# Patient Record
Sex: Male | Born: 1940 | Race: White | Hispanic: No | Marital: Married
Health system: Southern US, Community
[De-identification: ages and names within clinical notes are randomized; demographics above are authoritative.]

## PROBLEM LIST (undated history)

## (undated) DIAGNOSIS — N200 Calculus of kidney: Secondary | ICD-10-CM

## (undated) DIAGNOSIS — G4700 Insomnia, unspecified: Secondary | ICD-10-CM

## (undated) DIAGNOSIS — Z95811 Presence of heart assist device: Secondary | ICD-10-CM

## (undated) DIAGNOSIS — Z95810 Presence of automatic (implantable) cardiac defibrillator: Secondary | ICD-10-CM

## (undated) DIAGNOSIS — J189 Pneumonia, unspecified organism: Secondary | ICD-10-CM

## (undated) DIAGNOSIS — I459 Conduction disorder, unspecified: Secondary | ICD-10-CM

## (undated) DIAGNOSIS — K219 Gastro-esophageal reflux disease without esophagitis: Secondary | ICD-10-CM

## (undated) DIAGNOSIS — M1990 Unspecified osteoarthritis, unspecified site: Secondary | ICD-10-CM

## (undated) DIAGNOSIS — Z8719 Personal history of other diseases of the digestive system: Secondary | ICD-10-CM

## (undated) DIAGNOSIS — I428 Other cardiomyopathies: Secondary | ICD-10-CM

## (undated) HISTORY — PX: CYSTOSCOPY W/ STONE MANIPULATION: SHX1427

## (undated) HISTORY — PX: CARDIAC CATHETERIZATION: SHX172

## (undated) HISTORY — DX: Insomnia, unspecified: G47.00

## (undated) HISTORY — DX: Gastro-esophageal reflux disease without esophagitis: K21.9

## (undated) HISTORY — DX: Other cardiomyopathies: I42.8

## (undated) HISTORY — DX: Unspecified osteoarthritis, unspecified site: M19.90

## (undated) HISTORY — PX: APPENDECTOMY: SHX54

## (undated) HISTORY — PX: EXPLORATORY LAPAROTOMY: SUR591

## (undated) HISTORY — DX: Calculus of kidney: N20.0

## (undated) HISTORY — PX: LITHOTRIPSY: SUR834

---

## 1988-11-07 HISTORY — PX: KNEE ARTHROSCOPY: SHX127

## 1999-03-10 HISTORY — PX: POSTERIOR FUSION CERVICAL SPINE: SUR628

## 2001-12-16 ENCOUNTER — Ambulatory Visit (HOSPITAL_COMMUNITY): Admission: RE | Admit: 2001-12-16 | Discharge: 2001-12-16 | Payer: Self-pay | Admitting: Cardiology

## 2005-03-09 HISTORY — PX: VOLVULUS REDUCTION: SHX425

## 2005-10-02 ENCOUNTER — Ambulatory Visit: Payer: Self-pay | Admitting: Cardiology

## 2012-09-29 ENCOUNTER — Encounter: Payer: Self-pay | Admitting: *Deleted

## 2012-10-05 ENCOUNTER — Encounter: Payer: Self-pay | Admitting: Cardiology

## 2012-10-06 ENCOUNTER — Ambulatory Visit (INDEPENDENT_AMBULATORY_CARE_PROVIDER_SITE_OTHER): Payer: Medicare Other | Admitting: Cardiology

## 2012-10-06 ENCOUNTER — Encounter: Payer: Self-pay | Admitting: Cardiology

## 2012-10-06 VITALS — BP 99/66 | HR 62 | Ht 74.0 in | Wt 188.0 lb

## 2012-10-06 DIAGNOSIS — I429 Cardiomyopathy, unspecified: Secondary | ICD-10-CM

## 2012-10-06 DIAGNOSIS — R55 Syncope and collapse: Secondary | ICD-10-CM | POA: Insufficient documentation

## 2012-10-06 DIAGNOSIS — I447 Left bundle-branch block, unspecified: Secondary | ICD-10-CM

## 2012-10-06 NOTE — Assessment & Plan Note (Signed)
Episode of apparent syncope as outlined, etiology is not certain. Possibilities would include a sudden tachyarrhythmia with resulting hypotension, possibly VT in light of his cardiomyopathy. Could have also been a symptomatic bradycardia event or pause with bradycardia and left branch block at baseline. Neurally mediated event could also be considered since it occurred when he was bending over. I have explained that he should not drive at this time pending further evaluation.

## 2012-10-06 NOTE — Patient Instructions (Addendum)
Your physician recommends that you continue on your current medications as directed. Please refer to the Current Medication list given to you today. Your physician has recommended that you wear a 24 holter monitor. Holter monitors are medical devices that record the heart's electrical activity. Doctors most often use these monitors to diagnose arrhythmias. Arrhythmias are problems with the speed or rhythm of the heartbeat. The monitor is a small, portable device. You can wear one while you do your normal daily activities. This is usually used to diagnose what is causing palpitations/syncope (passing out). You have been referred to the [facility].

## 2012-10-06 NOTE — Assessment & Plan Note (Signed)
ECG reviewed. In light of cardiomyopathy and syncopal event, 24-hour Holter monitor will be placed to assess for PVC burden, also exclude any markedly bradycardia or pauses.

## 2012-10-06 NOTE — Progress Notes (Signed)
Clinical Summary [name] is a 72 y.o.male referred for cardiology consultation by Dr. Reuel Boom. Records indicate evaluation by our practice in 2003 at which time the patient had a cardiac catheterization demonstrating LVEF of 45% with no significant CAD, consistent with nonischemic cardiomyopathy. Interval echocardiogram at [facility] in 2007 indicated LVEF of 50-55%.  He has had no regular cardiology followup. He reports feeling well in general, NYHA class II dyspnea, no chest pain, occasional atypical left-sided neck pain that is not specifically exertional. He reports an episode of apparent syncope within the last few weeks. 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 He is described by his wife is being somewhat confused after this, but then after about 15 minutes back to baseline. He does not recall any sense of chest pain or palpitations at the time. This has not recurred.  Echocardiogram done this July at [facility] reports dilated LV with LVEF approximately 25%, diffuse hypokinesis, mild mitral regurgitation, mild tricuspid regurgitation. Recent ECG shows sinus bradycardia at 56 with prolonged PR interval and left bundle branch block.  His family history includes nonischemic cardiomyopathy in his mother and a brother. Brother is actually status post cardiac transplantation.  Medications were reviewed. He is not on any specific antihypertensives.   Allergies  Allergen Reactions  . Morphine And Related     Current Outpatient Prescriptions  Medication Sig Dispense Refill  . B Complex Vitamins (B COMPLEX 50 PO) Take 1 tablet by mouth daily.      . Calcium Carbonate-Vitamin D (CALTRATE 600+D PO) Take 1 tablet by mouth daily.      . diphenhydrAMINE (BENADRYL) 25 MG tablet Take 25 mg by mouth at bedtime.       . Glucosamine Sulfate 1000 MG CAPS Take 1,000 mg by mouth  daily.      . Multiple Vitamin (MULTIVITAMIN) tablet Take 1 tablet by mouth daily.      . pantoprazole (PROTONIX) 40 MG tablet Take 40 mg by mouth daily.      . psyllium (REGULOID) 0.52 G capsule Take 0.52 g by mouth 2 (two) times daily.      . vitamin A 8000 UNIT capsule Take 8,000 Units by mouth daily.      . vitamin B-12 (CYANOCOBALAMIN) 1000 MCG tablet Take 1,000 mcg by mouth 2 (two) times daily.      . vitamin C (ASCORBIC ACID) 500 MG tablet Take 500 mg by mouth daily.      Marland Kitchen zolpidem (AMBIEN) 10 MG tablet Take 5 mg by mouth at bedtime.        No current facility-administered medications for this visit.    Past Medical History  Diagnosis Date  . Nephrolithiasis   . GERD (gastroesophageal reflux disease)   . Osteoarthritis   . Insomnia   . Nonischemic cardiomyopathy     Cardiac catheterization 12/2011 with LVEF 45%, no significant obstructive CAD    Past Surgical History  Procedure Laterality Date  . Exploratory laparotomy    . Appendectomy      Family History  Problem Relation Age of Onset  . Cancer - Prostate Maternal Uncle   . Heart failure Mother   . Stroke Mother   . Congestive Heart Failure Brother   . Heart disease Father   . Breast cancer Sister   . Congestive Heart Failure Brother   . Cancer - Ovarian Sister  Social History Mr. Engelhard reports that he has never smoked. His smokeless tobacco use includes Chew. Mr. Ksiazek reports that he does not drink alcohol.  Review of Systems States that he has had a long-term "heart skip." No history of thyroid disease. No orthopnea or PND. No leg edema. No claudication. No reproducible exertional chest pain. Stable appetite, no unusual nausea early satiety. No bleeding problems. Otherwise negative.  Physical Examination Filed Vitals:   10/06/12 1443  BP: 99/66  Pulse: 62   Filed Weights   10/06/12 1443  Weight: 188 lb (85.276 kg)   Tall well-nourished appearing male, no acute distress. HEENT:  Conjunctiva and lids normal, oropharynx clear with moist mucosa. Neck: Supple, normal JVP, no carotid bruits, no thyromegaly. Lungs: Clear to auscultation, nonlabored breathing at rest. Cardiac: Indistinct PMI , regular rate and rhythm, no S3, soft apical systolic murmur, no pericardial rub. Abdomen: Soft, nontender, no hepatomegaly, bowel sounds present, no guarding or rebound. Extremities: No pitting edema, distal pulses 2+. Skin: Warm and dry. Musculoskeletal: No kyphosis. Neuropsychiatric: Alert and oriented x3, affect grossly appropriate.   Problem List and Plan   Syncope Episode of apparent syncope as outlined, etiology is not certain. Possibilities would include a sudden tachyarrhythmia with resulting hypotension, possibly VT in light of his cardiomyopathy. Could have also been a symptomatic bradycardia event or pause with bradycardia and left branch block at baseline. Neurally mediated event could also be considered since it occurred when he was bending over. I have explained that he should not drive at this time pending further evaluation.  Left bundle branch block ECG reviewed. In light of cardiomyopathy and syncopal event, 24-hour Holter monitor will be placed to assess for PVC burden, also exclude any markedly bradycardia or pauses.  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 Cardiac catheterization a little over 10 years ago did not demonstrate any obstructive CAD. His LVEF was recently defined at 25% with diffuse hypokinesis, a decrease from prior assessment. He is not on any cardiac medications at this time, and will likely be limited in terms of resting bradycardia and low normal blood pressure at baseline. I think that the best option here is to have him evaluated by Dr. Gala Romney in our Advanced Heart Failure clinic. A right and left heart catheterization will need to be considered, and then referral for EP evaluation to discuss device  options. He may be a good candidate for CRT-D.    Jonelle Sidle, M.D., F.A.C.C.

## 2012-10-06 NOTE — Assessment & Plan Note (Signed)
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 Cardiac catheterization a little over 10 years ago did not demonstrate any obstructive CAD. His LVEF was recently defined at 25% with diffuse hypokinesis, a decrease from prior assessment. He is not on any cardiac medications at this time, and will likely be limited in terms of resting bradycardia and low normal blood pressure at baseline. I think that the best option here is to have him evaluated by Dr. Gala Romney in our Advanced Heart Failure clinic. A right and left heart catheterization will need to be considered, and then referral for EP evaluation to discuss device options. He may be a good candidate for CRT-D.

## 2012-10-18 ENCOUNTER — Encounter (HOSPITAL_COMMUNITY): Payer: Medicare Other

## 2012-10-19 ENCOUNTER — Encounter (HOSPITAL_COMMUNITY): Payer: Medicare Other

## 2012-10-24 ENCOUNTER — Ambulatory Visit (HOSPITAL_COMMUNITY)
Admission: RE | Admit: 2012-10-24 | Discharge: 2012-10-24 | Disposition: A | Discharge status: Home / Self Care | Payer: Medicare Other | Source: Ambulatory Visit | Attending: Cardiology | Admitting: Cardiology

## 2012-10-24 DIAGNOSIS — R55 Syncope and collapse: Secondary | ICD-10-CM | POA: Insufficient documentation

## 2012-10-24 NOTE — Progress Notes (Signed)
*  PRELIMINARY RESULTS* Echocardiogram 24H Holter monitor has been performed.  Conrad [place] 10/24/2012, 11:25 AM

## 2012-10-26 ENCOUNTER — Telehealth: Payer: Self-pay | Admitting: *Deleted

## 2012-10-26 DIAGNOSIS — I447 Left bundle-branch block, unspecified: Secondary | ICD-10-CM

## 2012-10-26 DIAGNOSIS — I429 Cardiomyopathy, unspecified: Secondary | ICD-10-CM

## 2012-10-26 DIAGNOSIS — R55 Syncope and collapse: Secondary | ICD-10-CM

## 2012-10-26 NOTE — Telephone Encounter (Signed)
Patient informed and said he can go any day except Tuesdays.

## 2012-10-26 NOTE — Telephone Encounter (Signed)
Message copied by Eustace Moore on Wed Oct 26, 2012  3:38 PM ------      Message from: MCDOWELL, Illene Bolus      Created: Wed Oct 26, 2012  2:04 PM       Please ensure that the patient's CHF clinic consultation is soon. He will also need to be seen by EP. ------

## 2012-10-27 ENCOUNTER — Encounter (HOSPITAL_COMMUNITY): Payer: Self-pay | Admitting: *Deleted

## 2012-10-27 ENCOUNTER — Ambulatory Visit (HOSPITAL_COMMUNITY)
Admission: RE | Admit: 2012-10-27 | Discharge: 2012-10-27 | Disposition: A | Discharge status: Home / Self Care | Payer: Medicare Other | Source: Ambulatory Visit | Attending: Internal Medicine | Admitting: Internal Medicine

## 2012-10-27 ENCOUNTER — Encounter (HOSPITAL_COMMUNITY): Payer: Self-pay

## 2012-10-27 VITALS — BP 104/58 | HR 57 | Wt 187.2 lb

## 2012-10-27 DIAGNOSIS — I5022 Chronic systolic (congestive) heart failure: Secondary | ICD-10-CM

## 2012-10-27 DIAGNOSIS — I428 Other cardiomyopathies: Secondary | ICD-10-CM

## 2012-10-27 DIAGNOSIS — I429 Cardiomyopathy, unspecified: Secondary | ICD-10-CM

## 2012-10-27 LAB — BASIC METABOLIC PANEL
CO2: 27 mEq/L (ref 19–32)
Calcium: 9.6 mg/dL (ref 8.4–10.5)
GFR calc non Af Amer: 82 mL/min — ABNORMAL LOW (ref >90)
Potassium: 4.8 mEq/L (ref 3.5–5.1)
Sodium: 141 mEq/L (ref 135–145)

## 2012-10-27 LAB — CBC
Hemoglobin: 14.2 g/dL (ref 13.0–17.0)
Platelets: 94 10*3/uL — ABNORMAL LOW (ref 150–400)
RBC: 4.72 MIL/uL (ref 4.22–5.81)
WBC: 4.6 10*3/uL (ref 4.0–10.5)

## 2012-10-27 LAB — PROTIME-INR: Prothrombin Time: 13.9 seconds (ref 11.6–15.2)

## 2012-10-27 MED ORDER — LISINOPRIL 5 MG PO TABS: 5.0000 mg | ORAL_TABLET | Freq: Every day | ORAL | Status: DC

## 2012-10-27 MED ORDER — CARVEDILOL 3.125 MG PO TABS: 3.1250 mg | ORAL_TABLET | Freq: Two times a day (BID) | ORAL | Status: DC

## 2012-10-27 NOTE — Progress Notes (Signed)
Patient ID: [name], male   DOB: 03/06/1941, 71 y.o.   MRN: [id number] Referring Physician: Dr. McDowell Primary Care: Dr. Terry Daniels ([place]) Primary Cardiologist:   HPI: [name] is a pleasant 71 yo male with a h/o familial cardiomyopathy and LBBB who was referred to the clinic today by Dr. McDowell for a recent decrease in his EF. In 2003 he had an echo with EF 45%. F/u cardiac catheretization which with no significant CAD. He had a repeat ECHO in 2007 which showed EF 50-55%.   6 siblings 3 boys 3 girls 2 brothers: 1 with transplant (had frequent VT), other with HF and ICD 1 sister with CHF and ICD 1 sister with ovarian cancer 1 sister OK No other known family member with CM   Has been doing well. No symptoms. In July he was outside and bent over to turn off spigot and got very dizzy and he fell over and caught himself before he hit his face on a rock. Denies loss of consciousness. Family said he was confused for a bit. No palpitations , CP or seizure activity.  No further episodes. Had a repeat ECHO showing his EF now 25% with diffuse HK, mild MR, and mild TR. He has a family history of NICM (mother and brother) and his brother is s/p cardiac transplantation. He had a 24 holter monitor placed showing SR 41-112 with average 64 bpm, frequent PVCs with some NSVT. About 8% beats PVCs, also had some pauses ranging 2.0-2.8 seconds.   Reports over the last couple of months he maybe a little more fatigued, however it is hard to tell because the summer has been so hot and he thinks he may have over exerted himself at work. 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 Has noticed some CP that feels like electricity that comes through his back to his L side of chest that last 15-30 seconds over the last couple of months. He reports that this pain is very infrequent. Denies any SOB, orthopnea, edema, or weight gain. + heart palpitations. He  is very active outdoors and has no issues with SOB on exertion.  Review of Systems: [y] = yes, [ ] = no   General: Weight gain [N ]; Weight loss [N ]; Anorexia [ ]; Fatigue [ ]; Fever [ ]; Chills [ ]; Weakness [ N]  Cardiac: Chest pain/pressure [ ]; Resting SOB [N ]; Exertional SOB [N ]; Orthopnea [N ]; Pedal Edema [ N]; Palpitations [Y ]; Syncope [ ]; Presyncope [ ]; Paroxysmal nocturnal dyspnea[ ]  Pulmonary: Cough [ ]; Wheezing[ ]; Hemoptysis[ ]; Sputum [ ]; Snoring [ ]  GI: Vomiting[ ]; Dysphagia[ ]; Melena[ ]; Hematochezia [ ]; Heartburn[ ]; Abdominal pain [ ]; Constipation [ ]; Diarrhea [ ]; BRBPR [ ]  GU: Hematuria[ ]; Dysuria [ ]; Nocturia[ ]  Vascular: Pain in legs with walking [ ]; Pain in feet with lying flat [ ]; Non-healing sores [ ]; Stroke [ ]; TIA [ ]; Slurred speech [ ];  Neuro: Headaches[ ]; Vertigo[ ]; Seizures[ ]; Paresthesias[ ];Blurred vision [ ]; Diplopia [ ]; Vision changes [ ]  Ortho/Skin: Arthritis [ ]; Joint pain [Y ]; Muscle pain [ ]; Joint swelling [ ]; Back Pain [ ]; Rash [ ]  Psych: Depression[ ]; Anxiety[ ]  Heme: Bleeding problems [ ]; Clotting disorders [ ]; Anemia [ ]  Endocrine: Diabetes [ ]; Thyroid dysfunction[ ]  Past   Medical History  Diagnosis Date  . Nephrolithiasis   . GERD (gastroesophageal reflux disease)   . Osteoarthritis   . Insomnia   . Nonischemic cardiomyopathy     Cardiac catheterization 12/2011 with LVEF 45%, no significant obstructive CAD    Current Outpatient Prescriptions  Medication Sig Dispense Refill  . B Complex Vitamins (B COMPLEX 50 PO) Take 1 tablet by mouth daily.      . Calcium Carbonate-Vitamin D (CALTRATE 600+D PO) Take 1 tablet by mouth daily.      . diphenhydrAMINE (BENADRYL) 25 MG tablet Take 25 mg by mouth at bedtime.       . Glucosamine Sulfate 1000 MG CAPS Take 1,000 mg by mouth daily.      . Multiple Vitamin (MULTIVITAMIN) tablet Take 1 tablet by mouth daily.      . pantoprazole (PROTONIX) 40 MG tablet Take 40  mg by mouth daily.      . psyllium (REGULOID) 0.52 G capsule Take 0.52 g by mouth 2 (two) times daily.      . vitamin A 8000 UNIT capsule Take 8,000 Units by mouth daily.      . vitamin B-12 (CYANOCOBALAMIN) 1000 MCG tablet Take 1,000 mcg by mouth 2 (two) times daily.      . vitamin C (ASCORBIC ACID) 500 MG tablet Take 500 mg by mouth daily.      . zolpidem (AMBIEN) 10 MG tablet Take 5 mg by mouth at bedtime.        No current facility-administered medications for this encounter.    Allergies  Allergen Reactions  . Morphine And Related     History   Social History  . Marital Status: Married    Spouse Name: N/A    Number of Children: N/A  . Years of Education: N/A   Occupational History  . Not on file.   Social History Main Topics  . Smoking status: Never Smoker   . Smokeless tobacco: Current User    Types: Chew  . Alcohol Use: No  . Drug Use: No  . Sexual Activity: Not on file   Other Topics Concern  . 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 Married 51 years and live in [place], [place].    Family History  Problem Relation Age of Onset  . Cancer - Prostate Maternal Uncle   . Heart failure Mother   . Stroke Mother   . Asthma Mother   . Heart disease Father   . Heart failure Father   . Congestive Heart Failure Brother     @ 62 Heart transplant, [place]  . Congestive Heart Failure Brother     ICD  . Heart failure Sister     ICD  . Cancer - Ovarian Sister     deceased @ 48    PHYSICAL EXAM: Filed Vitals:   10/27/12 1353  BP: 104/58  Pulse: 57  Weight: 187 lb 4 oz (84.936 kg)  SpO2: 97%   Walked him in hall personally. Excellent exercise capacity. Baseline HR 54->115 with walking  General:  Well appearing. No respiratory difficulty; wife present HEENT: normal Neck: supple. no JVD. Carotids 2+ bilat; no bruits. No lymphadenopathy or thryomegaly appreciated. Cor: PMI laterally displaced. Regular rate & rhythm. 2/6  TR. No s3 Lungs: clear Abdomen: soft, nontender, nondistended. No hepatosplenomegaly. No bruits or masses. Good bowel sounds. Extremities: no cyanosis, clubbing, rash, edema Neuro: alert & oriented x 3, cranial   nerves grossly intact. moves all 4 extremities w/o difficulty. Affect pleasant.  I did bedside echo in clinic. 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 Hilger has moderate to severe LV dysfunction likely due to familial NICM. Functional status relatively well preserved. He has also had a recent syncopal episode with episodes of NSVT on f/u Holter monitor and I am concerned that syncopal episode may be due to tachyarrhythmia. BP and HR are both quite soft. We talked about his condition at length including the possible need for advanced therapies down the road as well as the role of genetic testing. We will plan the following:  1) R & L heart cath newt Wednesday 2) Place LifeVest 3) Start carvedilol 3.125 bid and lisinopril 5 mg qhs as BP and HR tolerate 4) Schedule CPX to objectively measure functional capacity 5) Consider genetic testing  Jalon Squier,MD 4:03 PM      

## 2012-10-27 NOTE — Patient Instructions (Addendum)
Start taking carvedilol 3.125 mg twice a day  Start taking lisinopril 5 mg at night.  Please call if you have sustained fatigue or dizziness. [phone]  Will get LifeVest placed.  Follow up 3-4 weeks

## 2012-10-31 ENCOUNTER — Other Ambulatory Visit (HOSPITAL_COMMUNITY): Payer: Self-pay | Admitting: Anesthesiology

## 2012-10-31 DIAGNOSIS — I5022 Chronic systolic (congestive) heart failure: Secondary | ICD-10-CM

## 2012-11-02 ENCOUNTER — Encounter (HOSPITAL_BASED_OUTPATIENT_CLINIC_OR_DEPARTMENT_OTHER): Admission: RE | Payer: Self-pay | Source: Ambulatory Visit

## 2012-11-02 ENCOUNTER — Encounter (HOSPITAL_BASED_OUTPATIENT_CLINIC_OR_DEPARTMENT_OTHER)
Admission: RE | Disposition: A | Discharge status: Home / Self Care | Payer: Self-pay | Source: Ambulatory Visit | Attending: Internal Medicine

## 2012-11-02 ENCOUNTER — Inpatient Hospital Stay (HOSPITAL_BASED_OUTPATIENT_CLINIC_OR_DEPARTMENT_OTHER): Admission: RE | Admit: 2012-11-02 | Payer: Medicare Other | Source: Ambulatory Visit | Admitting: Internal Medicine

## 2012-11-02 ENCOUNTER — Inpatient Hospital Stay (HOSPITAL_BASED_OUTPATIENT_CLINIC_OR_DEPARTMENT_OTHER)
Admission: RE | Admit: 2012-11-02 | Discharge: 2012-11-02 | Disposition: A | Discharge status: Home / Self Care | Payer: Medicare Other | Source: Ambulatory Visit | Attending: Internal Medicine | Admitting: Internal Medicine

## 2012-11-02 DIAGNOSIS — I509 Heart failure, unspecified: Secondary | ICD-10-CM | POA: Insufficient documentation

## 2012-11-02 DIAGNOSIS — I2510 Atherosclerotic heart disease of native coronary artery without angina pectoris: Secondary | ICD-10-CM

## 2012-11-02 DIAGNOSIS — I5022 Chronic systolic (congestive) heart failure: Secondary | ICD-10-CM

## 2012-11-02 LAB — POCT I-STAT 3, VENOUS BLOOD GAS (G3P V)
Bicarbonate: 24.8 mEq/L — ABNORMAL HIGH (ref 20.0–24.0)
Bicarbonate: 26.8 mEq/L — ABNORMAL HIGH (ref 20.0–24.0)
O2 Saturation: 60 %
TCO2: 26 mmol/L (ref 0–100)
TCO2: 28 mmol/L (ref 0–100)
pCO2, Ven: 49.7 mmHg (ref 45.0–50.0)
pH, Ven: 7.306 — ABNORMAL HIGH (ref 7.250–7.300)
pO2, Ven: 33 mmHg (ref 30.0–45.0)
pO2, Ven: 33 mmHg (ref 30.0–45.0)

## 2012-11-02 LAB — POCT I-STAT 3, ART BLOOD GAS (G3+)
pCO2 arterial: 41.5 mmHg (ref 35.0–45.0)
pH, Arterial: 7.364 (ref 7.350–7.450)

## 2012-11-02 SURGERY — JV LEFT AND RIGHT HEART CATHETERIZATION WITH CORONARY ANGIOGRAM

## 2012-11-02 SURGERY — JV LEFT AND RIGHT HEART CATHETERIZATION WITH CORONARY ANGIOGRAM
Anesthesia: Moderate Sedation

## 2012-11-02 MED ORDER — ASPIRIN 81 MG PO CHEW
324.0000 mg | CHEWABLE_TABLET | ORAL | Status: AC
  Administered 2012-11-02: 324 mg via ORAL

## 2012-11-02 MED ORDER — ACETAMINOPHEN 325 MG PO TABS: 650.0000 mg | ORAL_TABLET | ORAL | Status: DC | PRN

## 2012-11-02 MED ORDER — SODIUM CHLORIDE 0.9 % IV SOLN: INTRAVENOUS | Status: AC

## 2012-11-02 MED ORDER — SODIUM CHLORIDE 0.9 % IJ SOLN: 3.0000 mL | Freq: Two times a day (BID) | INTRAMUSCULAR | Status: DC

## 2012-11-02 MED ORDER — SODIUM CHLORIDE 0.9 % IJ SOLN: 3.0000 mL | INTRAMUSCULAR | Status: DC | PRN

## 2012-11-02 MED ORDER — SODIUM CHLORIDE 0.9 % IV SOLN: 250.0000 mL | INTRAVENOUS | Status: DC | PRN

## 2012-11-02 MED ORDER — ONDANSETRON HCL 4 MG/2ML IJ SOLN: 4.0000 mg | Freq: Four times a day (QID) | INTRAMUSCULAR | Status: DC | PRN

## 2012-11-02 NOTE — CV Procedure (Signed)
Cardiac Cath Procedure Note  Indication: Heart failure  Procedures performed:  1) Right heart cathererization 2) Selective coronary angiography 3) Left heart catheterization 4) Left ventriculogram  Description of procedure:     The risks and indication of the procedure were explained. Consent was signed and placed on the chart. An appropriate timeout was taken prior to the procedure. The right groin was prepped and draped in the routine sterile fashion and anesthetized with 1% local lidocaine.   A 5 FR arterial sheath was placed in the right femoral artery using a modified Seldinger technique. Standard catheters including a JL4, JR4 and angled pigtail were used. All catheter exchanges were made over a wire. A 7 FR venous sheath was placed in the right femoral vein using a modified Seldinger technique. A standard Swan-Ganz catheter was used for the procedure.   Complications:  None apparent  Findings:  RA = 5 RV = 41/4/8 PA = 39/11 (27) PCW = 21 v = 35 Fick cardiac output/index = 3.6/1.7 Thermo CO/CI = 3.2/1.5 PVR = 1.6 Woods SVR = 2075 FA sat = 96% PA sat = 57%, 60%  Ao Pressure: 115/69 (88) LV Pressure: 118/18/22 There was no signficant gradient across the aortic valve on pullback.  Left main: Normal  LAD: Large vessel gives off 1 diagonal. 20-30% proximal lesion. Otherwise normal  LCX: Small ramus. Tiny OM-1. Moderate-sized OM-2. Normal  RCA: Dominant. Normal.  LV-gram done in the RAO projection: Ejection fraction = 15% severe global HK. I do not see clear MR jet  Assessment: 1. Minimal CAD 2. Severe LV dysfunction with low output physiology 3. Significant v-waves in PCWP tracing suggesting of MR  Plan/Discussion:  He has familial non-ischemic CM with severe LV dysfunction and low-output. I suspect he will need advanced therapies in near future. Will start spironolactone 12.5 daily. Schedule CPX test to further risk stratify and decide on timing of next steps.    Daniel Bensimhon,MD 11:25 AM

## 2012-11-02 NOTE — OR Nursing (Signed)
Tegaderm dressing applied, site level 0, bedrest begins at 1200 

## 2012-11-02 NOTE — Interval H&P Note (Signed)
History and Physical Interval Note:  11/02/2012 10:23 AM  [name]  has presented today for surgery, with the diagnosis of cp  The various methods of treatment have been discussed with the patient and family. After consideration of risks, benefits and other options for treatment, the patient has consented to  Procedure(s): JV LEFT AND RIGHT HEART CATHETERIZATION WITH CORONARY ANGIOGRAM (N/A) as a surgical intervention .  The patient's history has been reviewed, patient examined, no change in status, stable for surgery.  I have reviewed the patient's chart and labs.  Questions were answered to the patient's satisfaction.     [name]

## 2012-11-02 NOTE — OR Nursing (Signed)
Discharge instructions reviewed and signed, pt stated understanding, ambulated in hall without difficulty, site level 0, transported to wife's car via wheelchair 

## 2012-11-02 NOTE — H&P (View-Only) (Signed)
Patient ID: [name], male   DOB: [birth year], 72 y.o.   MRN: [id number] Referring Physician: Dr. Diona Browner Primary Care: Dr. Almond Lint [facility]) Primary Cardiologist:   HPI: [name] is a pleasant 72 yo male with a h/o familial cardiomyopathy and LBBB who was referred to the clinic today by Dr. Diona Browner for a recent decrease in his EF. In 2003 he had an echo with EF 45%. F/u cardiac catheretization which with no significant CAD. He had a repeat ECHO in 2007 which showed EF 50-55%.   6 siblings 3 boys 3 girls 2 brothers: 1 with transplant (had frequent VT), other with HF and ICD 1 sister with CHF and ICD 1 sister with ovarian cancer 1 sister OK No other known family member with CM   Has been doing well. No symptoms. In July he was outside and bent over to turn off spigot and got very dizzy and he fell over and caught himself before he hit his face on a rock. Denies loss of consciousness. Family said he was confused for a bit. No palpitations , CP or seizure activity.  No further episodes. Had a repeat ECHO showing his EF now 25% with diffuse HK, mild MR, and mild TR. He has a family history of NICM (mother and brother) and his brother is s/p cardiac transplantation. He had a 24 holter monitor placed showing SR 41-112 with average 64 bpm, frequent PVCs with some NSVT. About 8% beats PVCs, also had some pauses ranging 2.0-2.8 seconds.   Reports over the last couple of months he maybe a little more fatigued, however it is hard to tell because the summer has been so hot and he thinks he may have over exerted himself at work. 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 Has noticed some CP that feels like electricity that comes through his back to his L side of chest that last 15-30 seconds over the last couple of months. He reports that this pain is very infrequent. Denies any SOB, orthopnea, edema, or weight gain. + heart palpitations. He  is very active outdoors and has no issues with SOB on exertion.  Review of Systems: [y] = yes, [ ]  = no   General: Weight gain Klaus.Mock ]; Weight loss Klaus.Mock ]; Anorexia [ ] ; Fatigue [ ] ; Fever [ ] ; Chills [ ] ; Weakness [ N]  Cardiac: Chest pain/pressure [ ] ; Resting SOB Klaus.Mock ]; Exertional SOB Klaus.Mock ]; Myer Peer ]; Pedal Edema [ N]; Palpitations [Y ]; Syncope [ ] ; Presyncope [ ] ; Paroxysmal nocturnal dyspnea[ ]   Pulmonary: Cough [ ] ; Wheezing[ ] ; Hemoptysis[ ] ; Sputum [ ] ; Snoring [ ]   GI: Vomiting[ ] ; Dysphagia[ ] ; Melena[ ] ; Hematochezia [ ] ; Heartburn[ ] ; Abdominal pain [ ] ; Constipation [ ] ; Diarrhea [ ] ; BRBPR [ ]   GU: Hematuria[ ] ; Dysuria [ ] ; Nocturia[ ]   Vascular: Pain in legs with walking [ ] ; Pain in feet with lying flat [ ] ; Non-healing sores [ ] ; Stroke [ ] ; TIA [ ] ; Slurred speech [ ] ;  Neuro: Headaches[ ] ; Vertigo[ ] ; Seizures[ ] ; Paresthesias[ ] ;Blurred vision [ ] ; Diplopia [ ] ; Vision changes [ ]   Ortho/Skin: Arthritis [ ] ; Joint pain [Y ]; Muscle pain [ ] ; Joint swelling [ ] ; Back Pain [ ] ; Rash [ ]   Psych: Depression[ ] ; Anxiety[ ]   Heme: Bleeding problems [ ] ; Clotting disorders [ ] ; Anemia [ ]   Endocrine: Diabetes [ ] ; Thyroid dysfunction[ ]   Past  Medical History  Diagnosis Date  . Nephrolithiasis   . GERD (gastroesophageal reflux disease)   . Osteoarthritis   . Insomnia   . Nonischemic cardiomyopathy     Cardiac catheterization 12/2011 with LVEF 45%, no significant obstructive CAD    Current Outpatient Prescriptions  Medication Sig Dispense Refill  . B Complex Vitamins (B COMPLEX 50 PO) Take 1 tablet by mouth daily.      . Calcium Carbonate-Vitamin D (CALTRATE 600+D PO) Take 1 tablet by mouth daily.      . diphenhydrAMINE (BENADRYL) 25 MG tablet Take 25 mg by mouth at bedtime.       . Glucosamine Sulfate 1000 MG CAPS Take 1,000 mg by mouth daily.      . Multiple Vitamin (MULTIVITAMIN) tablet Take 1 tablet by mouth daily.      . pantoprazole (PROTONIX) 40 MG tablet Take 40  mg by mouth daily.      . psyllium (REGULOID) 0.52 G capsule Take 0.52 g by mouth 2 (two) times daily.      . vitamin A 8000 UNIT capsule Take 8,000 Units by mouth daily.      . vitamin B-12 (CYANOCOBALAMIN) 1000 MCG tablet Take 1,000 mcg by mouth 2 (two) times daily.      . vitamin C (ASCORBIC ACID) 500 MG tablet Take 500 mg by mouth daily.      Marland Kitchen zolpidem (AMBIEN) 10 MG tablet Take 5 mg by mouth at bedtime.        No current facility-administered medications for this encounter.    Allergies  Allergen Reactions  . Morphine And Related     History   Social History  . Marital Status: Married    Spouse Name: N/A    Number of Children: N/A  . Years of Education: N/A   Occupational History  . Not on file.   Social History Main Topics  . Smoking status: Never Smoker   . Smokeless tobacco: Current User    Types: Chew  . Alcohol Use: No  . Drug Use: No  . Sexual Activity: Not on file   Other Topics Concern  . 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 Married 51 years and live in [place], [place].    Family History  Problem Relation Age of Onset  . Cancer - Prostate Maternal Uncle   . Heart failure Mother   . Stroke Mother   . Asthma Mother   . Heart disease Father   . Heart failure Father   . Congestive Heart Failure Brother     @ 62 Heart transplant, [place]  . Congestive Heart Failure Brother     ICD  . Heart failure Sister     ICD  . Cancer - Ovarian Sister     deceased @ 55    PHYSICAL EXAM: Filed Vitals:   10/27/12 1353  BP: 104/58  Pulse: 57  Weight: 187 lb 4 oz (84.936 kg)  SpO2: 97%   Walked him in hall personally. Excellent exercise capacity. Baseline HR 54->115 with walking  General:  Well appearing. No respiratory difficulty; wife present HEENT: normal Neck: supple. no JVD. Carotids 2+ bilat; no bruits. No lymphadenopathy or thryomegaly appreciated. Cor: PMI laterally displaced. Regular rate & rhythm. 2/6  TR. No s3 Lungs: clear Abdomen: soft, nontender, nondistended. No hepatosplenomegaly. No bruits or masses. Good bowel sounds. Extremities: no cyanosis, clubbing, rash, edema Neuro: alert & oriented x 3, cranial  nerves grossly intact. moves all 4 extremities w/o difficulty. Affect pleasant.  I did bedside echo in clinic. 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 Czaja has moderate to severe LV dysfunction likely due to familial NICM. Functional status relatively well preserved. He has also had a recent syncopal episode with episodes of NSVT on f/u Holter monitor and I am concerned that syncopal episode may be due to tachyarrhythmia. BP and HR are both quite soft. We talked about his condition at length including the possible need for advanced therapies down the road as well as the role of genetic testing. We will plan the following:  1) R & L heart cath newt Wednesday 2) Place LifeVest 3) Start carvedilol 3.125 bid and lisinopril 5 mg qhs as BP and HR tolerate 4) Schedule CPX to objectively measure functional capacity 5) Consider genetic testing  Truman Hayward 4:03 PM

## 2012-11-02 NOTE — OR Nursing (Signed)
Arterial and venous sheaths removed intact at 1140 by Augustine Radar, RN

## 2012-11-14 ENCOUNTER — Other Ambulatory Visit (HOSPITAL_COMMUNITY): Payer: Self-pay | Admitting: Internal Medicine

## 2012-11-14 ENCOUNTER — Encounter (HOSPITAL_COMMUNITY): Payer: Medicare Other

## 2012-11-14 ENCOUNTER — Ambulatory Visit (HOSPITAL_COMMUNITY): Payer: Medicare Other | Attending: Internal Medicine

## 2012-11-14 DIAGNOSIS — I509 Heart failure, unspecified: Secondary | ICD-10-CM | POA: Insufficient documentation

## 2012-11-17 ENCOUNTER — Other Ambulatory Visit: Payer: Self-pay

## 2012-11-17 ENCOUNTER — Ambulatory Visit (HOSPITAL_COMMUNITY)
Admission: RE | Admit: 2012-11-17 | Discharge: 2012-11-17 | Disposition: A | Discharge status: Home / Self Care | Payer: Medicare Other | Source: Ambulatory Visit | Attending: Cardiology | Admitting: Cardiology

## 2012-11-17 VITALS — BP 80/50 | HR 40 | Wt 186.8 lb

## 2012-11-17 DIAGNOSIS — I5022 Chronic systolic (congestive) heart failure: Secondary | ICD-10-CM | POA: Insufficient documentation

## 2012-11-17 DIAGNOSIS — I498 Other specified cardiac arrhythmias: Secondary | ICD-10-CM | POA: Insufficient documentation

## 2012-11-17 DIAGNOSIS — I447 Left bundle-branch block, unspecified: Secondary | ICD-10-CM | POA: Insufficient documentation

## 2012-11-17 DIAGNOSIS — R001 Bradycardia, unspecified: Secondary | ICD-10-CM

## 2012-11-17 LAB — BASIC METABOLIC PANEL
BUN: 17 mg/dL (ref 6–23)
Calcium: 9.4 mg/dL (ref 8.4–10.5)
Chloride: 102 mEq/L (ref 96–112)
Creatinine, Ser: 0.91 mg/dL (ref 0.50–1.35)
GFR calc Af Amer: >90 mL/min (ref >90)

## 2012-11-17 NOTE — Patient Instructions (Addendum)
Stop carvedilol  Will call with lab results.  Will refer to EP Dr. Graciela Husbands.  Do the following things EVERYDAY: 1) Weigh yourself in the morning before breakfast. Write it down and keep it in a log. 2) Take your medicines as prescribed 3) Eat low salt foods-Limit salt (sodium) to 2000 mg per day.  4) Stay as active as you can everyday 5) Limit all fluids for the day to less than 2 liters 6)

## 2012-11-17 NOTE — Progress Notes (Signed)
Patient ID: [name], male   DOB: 1940-06-16, 72 y.o.   MRN: [id number]  Weight Range   Baseline proBNP    Referring Physician: Dr. Diona Browner  Primary Care: Dr. Almond Lint [facility])  Primary Cardiologist:   HPI:  Mr. Auzenne is a pleasant 72 yo male with a h/o familial cardiomyopathy and LBBB who was referred to the clinic today by Dr. Diona Browner for a recent decrease in his EF. In 2003 he had an echo with EF 45%. F/u cardiac catheretization which with no significant CAD. He had a repeat ECHO in 2007 which showed EF 50-55%.   6 siblings 3 boys 3 girls  2 brothers: 1 with transplant (had frequent VT), other with HF and ICD  1 sister with CHF and ICD  1 sister with ovarian cancer  1 sister OK  No other known family member with CM   Was doing well until July 2014 when he was outside and bent over to turn off spigot and got very dizzy and he fell over and caught himself before he hit his face on a rock. He denied LOC. Family said he was confused for a bit. No palpitations , CP or seizure activity. No further episodes. Had a repeat ECHO showing his EF now 25% with diffuse HK, mild MR, and mild TR. He has a family history of NICM (mother and brother) and his brother is s/p cardiac transplantation. He had a 24 holter monitor placed showing SR 41-112 with average 64 bpm, frequent PVCs with some NSVT. About 8% beats PVCs, also had some pauses ranging 2.0-2.8 seconds. He was then referred to HF clinic.  R/LHC 11/02/2012 RA = 5  RV = 41/4/8  PA = 39/11 (27)  PCW = 21 v = 35  Fick cardiac output/index = 3.6/1.7  Thermo CO/CI = 3.2/1.5  PVR = 1.6 Woods  SVR = 2075  FA sat = 96%  PA sat = 57%, 60%  Ao Pressure: 115/69 (88)  LV Pressure: 118/18/22  There was no signficant gradient across the aortic valve on pullback.  Left main: Normal  LAD: Large vessel gives off 1 diagonal. 20-30% proximal lesion. Otherwise normal  LCX: Small ramus. Tiny OM-1. Moderate-sized OM-2. Normal  RCA:  Dominant. Normal.  LV-gram done in the RAO projection: Ejection fraction = 15% severe global HK. I do not see clear MR jet  Assessment:  1. Minimal CAD  2. Severe LV dysfunction with low output physiology  3. Significant v-waves in PCWP tracing suggesting of MR   CPX 11/14/2012 Resting HR: 52 Peak HR: 137 (92% age predicted max HR) BP rest: 112/76 BP peak: 140/77 Peak VO2: 24.4 (90.4% predicted peak VO2) VE/VCO2 slope: 29.3 OUES: 2.29 Peak RER: 1.18 Ventilatory Threshold: 18.4 (68.2% predicted peak VO2)  Follow up: Last visit had LifeVest placed and was scheduled for [facility] and CPX. We also started coreg 3.125 mg BID, lisinopril 5 mg daily and digoxin 0.125. Day of catheterization 12.5 mg of spironolactone was added. Feeling pretty good. Still doing everything he wants to do and did yard work for 2 hrs yesterday including weed eating. Denies SOB, orthopnea, or edema. + minimal CP. Taking medications as prescribed. + dizziness with bending over. No alarms with Life Vest.  Labs (8/14): K 4.8, creatinine 0.9  ROS: All systems negative except as listed in HPI, PMH and Problem List.  Past Medical History  Diagnosis Date  . Nephrolithiasis   . GERD (gastroesophageal reflux disease)   . Osteoarthritis   .  Insomnia   . Nonischemic cardiomyopathy     Cardiac catheterization 12/2011 with LVEF 45%, no significant obstructive CAD    Current Outpatient Prescriptions  Medication Sig Dispense Refill  . B Complex Vitamins (B COMPLEX 50 PO) Take 1 tablet by mouth daily.      . Calcium Carbonate-Vitamin D (CALTRATE 600+D PO) Take 1 tablet by mouth daily.      . carvedilol (COREG) 3.125 MG tablet Take 1 tablet (3.125 mg total) by mouth 2 (two) times daily with a meal.  60 tablet  6  . digoxin (LANOXIN) 0.125 MG tablet Take 0.125 mg by mouth daily.      . diphenhydrAMINE (BENADRYL) 25 MG tablet Take 25 mg by mouth at bedtime.       . Glucosamine Sulfate 1000 MG CAPS Take 1,000 mg by mouth daily.       Marland Kitchen lisinopril (PRINIVIL,ZESTRIL) 5 MG tablet Take 1 tablet (5 mg total) by mouth daily.  30 tablet  6  . Multiple Vitamin (MULTIVITAMIN) tablet Take 1 tablet by mouth daily.      . pantoprazole (PROTONIX) 40 MG tablet Take 40 mg by mouth daily.      . psyllium (REGULOID) 0.52 G capsule Take 0.52 g by mouth 2 (two) times daily.      Marland Kitchen spironolactone (ALDACTONE) 25 MG tablet Take 12.5 mg by mouth daily.      . vitamin A 8000 UNIT capsule Take 8,000 Units by mouth daily.      . vitamin B-12 (CYANOCOBALAMIN) 1000 MCG tablet Take 1,000 mcg by mouth 2 (two) times daily.      . vitamin C (ASCORBIC ACID) 500 MG tablet Take 500 mg by mouth daily.      Marland Kitchen zolpidem (AMBIEN) 10 MG tablet Take 5 mg by mouth at bedtime.        No current facility-administered medications for this encounter.    Filed Vitals:   11/17/12 1016  BP: 80/50  Pulse: 40  Weight: 186 lb 12 oz (84.709 kg)  SpO2: 96%   Physical Exam: General: Well appearing. No respiratory difficulty; wife present  HEENT: normal  Neck: supple. no JVD. Carotids 2+ bilat; no bruits. No lymphadenopathy or thryomegaly appreciated.  Cor: PMI laterally displaced. Regular rate & rhythm. 2/6 TR. No s3  Lungs: clear  Abdomen: soft, nontender, nondistended. No hepatosplenomegaly. No bruits or masses. Good bowel sounds.  Extremities: no cyanosis, clubbing, rash, edema  Neuro: alert & oriented x 3, cranial nerves grossly intact. moves all 4 extremities w/o difficulty. Affect pleasant.    ECG: Sinus brady 1 degree AV block @ 38 bpm, LBBB 148 ms   ASSESSMENT & PLAN:  1) Chronic systolic HF, NYHA I-II, NICM; EF 30-35% (checked at bedside 10/2012) - NYHA I/II symptoms. Volume status is good. - Can not titrate medications at this time d/t hypotension and bradycardia. HR 38 BPM. Will stop coreg today and refer to EP for consideration of CRT (LBBB with 148 ms). He has not been on evidence based medication for 3 months d/t not being able to titrate  medications. Will continue LifeVest at this time. - Will continue spiro at 25 mg daily, lisinopril 5 mg daily, and digoxin 0.25 - Check BMET today with initiation of spiro - Lengthy discussion with patient and wife about HF progression. Discussed that we do not think that he will need advanced therapies such as a heart transplant like his brother. Patient and wife very tearful and worried. Discussed ok to continue  with daily activities, however if he gets fatigued or SOB to stop and rest.   2) LBBB  - Refer to EP Dr. Graciela Husbands for consideration of CRT -F/U 2 weeks  Ulla Potash B NP-C 1:35 PM  Patient seen with NP, agree with the above notes.  I had a long discussion today with Mr Kawecki and his wife regarding future steps.  He is minimally symptomatic but has depressed LV systolic function, EF about 30%.  Nonischemic cardiomyopathy. Possible familial cardiomyopathy.  Wearing Lifevest with low EF and h/o NSVT.   Though not very symptomatic, RHC showed quite low cardiac index and cardiac meds were recently begun.  Today, however, HR is in the upper 30s and SBP is in the 80s.  He denies lightheadedness unless he bends over then stands up.  This significantly complicates medical treatment.   - Stop Coreg today with severe bradycardia.  Will continue digoxin for now as I am afraid he could decompensate if we abruptly stop digoxin.  - No BP room to titrate lisinopril or spironolactone.  - Given profound bradycardia and inability to use beta blocker, as well as the patient's wide LBBB (148 msec on ECG today), will go ahead and refer to EP to look into early CRT-D.   - We will need to consider genetic testing for familial cardiomyopathy.  - BMET today (several new medications).   Marca Ancona 11/17/2012

## 2012-11-28 ENCOUNTER — Ambulatory Visit (HOSPITAL_COMMUNITY)
Admission: RE | Admit: 2012-11-28 | Discharge: 2012-11-28 | Disposition: A | Discharge status: Home / Self Care | Payer: Medicare Other | Source: Ambulatory Visit | Attending: Internal Medicine | Admitting: Internal Medicine

## 2012-11-28 VITALS — BP 92/58 | HR 54 | Wt 184.2 lb

## 2012-11-28 DIAGNOSIS — Z79899 Other long term (current) drug therapy: Secondary | ICD-10-CM | POA: Insufficient documentation

## 2012-11-28 DIAGNOSIS — N200 Calculus of kidney: Secondary | ICD-10-CM | POA: Insufficient documentation

## 2012-11-28 DIAGNOSIS — I5022 Chronic systolic (congestive) heart failure: Secondary | ICD-10-CM

## 2012-11-28 DIAGNOSIS — R001 Bradycardia, unspecified: Secondary | ICD-10-CM | POA: Insufficient documentation

## 2012-11-28 DIAGNOSIS — I447 Left bundle-branch block, unspecified: Secondary | ICD-10-CM | POA: Insufficient documentation

## 2012-11-28 DIAGNOSIS — I498 Other specified cardiac arrhythmias: Secondary | ICD-10-CM

## 2012-11-28 DIAGNOSIS — K219 Gastro-esophageal reflux disease without esophagitis: Secondary | ICD-10-CM | POA: Insufficient documentation

## 2012-11-28 DIAGNOSIS — M1990 Unspecified osteoarthritis, unspecified site: Secondary | ICD-10-CM | POA: Insufficient documentation

## 2012-11-28 DIAGNOSIS — G4700 Insomnia, unspecified: Secondary | ICD-10-CM | POA: Insufficient documentation

## 2012-11-28 DIAGNOSIS — I429 Cardiomyopathy, unspecified: Secondary | ICD-10-CM

## 2012-11-28 DIAGNOSIS — I428 Other cardiomyopathies: Secondary | ICD-10-CM | POA: Insufficient documentation

## 2012-11-28 NOTE — Progress Notes (Signed)
Patient ID: JASPAL PULTZ, male   DOB: August 17, 1940, 72 y.o.   MRN: [id number]   Weight Range   Baseline proBNP    Referring Physician: Dr. Diona Browner  Primary Care: Dr. Almond Lint [facility])  Primary Cardiologist:   HPI:  Mr. Ponder is a pleasant 72 yo male with a h/o familial cardiomyopathy and LBBB who was referred to the clinic today by Dr. Diona Browner for a recent decrease in his EF. In 2003 he had an echo with EF 45%. ECHO in 2007 which showed EF 50-55%.   6 siblings 3 boys 3 girls  2 brothers: 1 with transplant (had frequent VT), other with HF and ICD  1 sister with CHF and ICD  1 sister with ovarian cancer  1 sister OK  No other known family member with CM   Was doing well until July 2014 when he was outside and bent over to turn off spigot and got very dizzy and he fell over and caught himself before he hit his face on a rock. He denied LOC. Family said he was confused for a bit. No palpitations , CP or seizure activity. No further episodes. Had a repeat ECHO showing his EF now 25% with diffuse HK, mild MR, and mild TR. He has a family history of NICM (mother and brother) and his brother is s/p cardiac transplantation. He had a 24 holter monitor placed showing SR 41-112 with average 64 bpm, frequent PVCs with some NSVT. About 8% beats PVCs, also had some pauses ranging 2.0-2.8 seconds. He was then referred to HF clinic.  R/LHC 11/02/2012 RA = 5  RV = 41/4/8  PA = 39/11 (27)  PCW = 21 v = 35  Fick cardiac output/index = 3.6/1.7  Thermo CO/CI = 3.2/1.5  PVR = 1.6 Woods  SVR = 2075  FA sat = 96%  PA sat = 57%, 60%  Ao Pressure: 115/69 (88)  LV Pressure: 118/18/22  There was no signficant gradient across the aortic valve on pullback.  Left main: Normal  LAD: Large vessel gives off 1 diagonal. 20-30% proximal lesion. Otherwise normal  LCX: Small ramus. Tiny OM-1. Moderate-sized OM-2. Normal  RCA: Dominant. Normal.  LV-gram done in the RAO projection: Ejection fraction = 15%  severe global HK. I do not see clear MR jet  Assessment:  1. Minimal CAD  2. Severe LV dysfunction with low output physiology  3. Significant v-waves in PCWP tracing suggesting of MR   CPX 11/14/2012 Resting HR: 52 Peak HR: 137 (92% age predicted max HR) BP rest: 112/76 BP peak: 140/77 Peak VO2: 24.4 (90.4% predicted peak VO2) VE/VCO2 slope: 29.3 OUES: 2.29 Peak RER: 1.18 Ventilatory Threshold: 18.4 (68.2% predicted peak VO2)  Follow up: Last visit stopped coreg d/t low BP and bradycardia. Referred to EP for CRT-D appt 12/07/12. Says he has been feeling pretty well but lisinopril cut back to 2.5 mg daily due to symptomatic hypotension with SBP in the 80. Now feels better. Over the weekend able to play with his grandkids without too much problem. Takes BP at home. SBP 95-112. Gets dizzy if he bends over. Denies any exertional dyspnea. No edema. Says he can walk all day on flat ground but gets winded if going up steps or a hill. Wearing lifeVest.   Labs (8/14): K 4.8, creatinine 0.9          (9/14): K 4.2, creatinine 0.91  SH: Lives with his wife in [place]  ROS: All systems negative except as listed  in HPI, PMH and Problem List.  Past Medical History  Diagnosis Date  . Nephrolithiasis   . GERD (gastroesophageal reflux disease)   . Osteoarthritis   . Insomnia   . Nonischemic cardiomyopathy     Cardiac catheterization 12/2011 with LVEF 45%, no significant obstructive CAD    Current Outpatient Prescriptions  Medication Sig Dispense Refill  . B Complex Vitamins (B COMPLEX 50 PO) Take 1 tablet by mouth daily.      . Calcium Carbonate-Vitamin D (CALTRATE 600+D PO) Take 1 tablet by mouth daily.      Marland Kitchen Cod Liver Oil 1000 MG CAPS Take 1 capsule by mouth daily.      . digoxin (LANOXIN) 0.125 MG tablet Take 0.125 mg by mouth daily.      . diphenhydrAMINE (BENADRYL) 25 MG tablet Take 25 mg by mouth at bedtime.       . Glucosamine Sulfate 1000 MG CAPS Take 1,000 mg by mouth daily.      Marland Kitchen  lisinopril (PRINIVIL,ZESTRIL) 5 MG tablet Take 2.5 mg by mouth daily.      . Multiple Vitamin (MULTIVITAMIN) tablet Take 1 tablet by mouth daily.      . pantoprazole (PROTONIX) 40 MG tablet Take 40 mg by mouth daily.      . psyllium (REGULOID) 0.52 G capsule Take 0.52 g by mouth 2 (two) times daily.      Marland Kitchen spironolactone (ALDACTONE) 25 MG tablet Take 12.5 mg by mouth daily.      . vitamin A 8000 UNIT capsule Take 8,000 Units by mouth daily.      . vitamin B-12 (CYANOCOBALAMIN) 1000 MCG tablet Take 1,000 mcg by mouth 2 (two) times daily.      . vitamin C (ASCORBIC ACID) 500 MG tablet Take 500 mg by mouth daily.      Marland Kitchen zolpidem (AMBIEN) 10 MG tablet Take 5 mg by mouth at bedtime.        No current facility-administered medications for this encounter.    Filed Vitals:   11/28/12 1519  BP: 92/58  Pulse: 54  Weight: 184 lb 4 oz (83.575 kg)  SpO2: 94%    Physical Exam: General: Well appearing. No respiratory difficulty; wife present  HEENT: normal  Neck: supple. no JVD. Carotids 2+ bilat; no bruits. No lymphadenopathy or thryomegaly appreciated.  Cor: PMI laterally displaced. Regular rate & rhythm. 2/6 TR. No s3  Lungs: clear  Abdomen: soft, nontender, nondistended. No hepatosplenomegaly. No bruits or masses. Good bowel sounds.  Extremities: no cyanosis, clubbing, rash, edema  Neuro: alert & oriented x 3, cranial nerves grossly intact. moves all 4 extremities w/o difficulty. Affect pleasant.      ASSESSMENT & PLAN:  1) Chronic systolic HF, NYHA II, NICM; EF 25%  --difficult case. He has several markers of advanced HF with severely depressed EF, low output on RHC and intolerance of even low-dose HF meds. However he is NYHA II and CPX test does not show a significant HF limitation.  --BP too low titrate meds further. --Given that bradycardia is limiting b-blocker titration will refer to EP for possible CRT-D -- Will discuss case with [facility] regarding timing of visit to transplant  clinic. We also discussed issue of genetic testing and need for genetic counseling.   2) LBBB  - Refer to EP Dr. Graciela Husbands for consideration of CRT -F/U 1 months  Arvilla Meres MD 3:28 PM

## 2012-12-05 ENCOUNTER — Institutional Professional Consult (permissible substitution): Payer: Medicare Other | Admitting: Internal Medicine

## 2012-12-07 ENCOUNTER — Ambulatory Visit (INDEPENDENT_AMBULATORY_CARE_PROVIDER_SITE_OTHER): Payer: Medicare Other | Admitting: Internal Medicine

## 2012-12-07 ENCOUNTER — Encounter: Payer: Self-pay | Admitting: *Deleted

## 2012-12-07 ENCOUNTER — Encounter: Payer: Self-pay | Admitting: Internal Medicine

## 2012-12-07 VITALS — BP 118/68 | HR 44 | Ht 73.0 in | Wt 187.0 lb

## 2012-12-07 DIAGNOSIS — R55 Syncope and collapse: Secondary | ICD-10-CM

## 2012-12-07 DIAGNOSIS — I498 Other specified cardiac arrhythmias: Secondary | ICD-10-CM

## 2012-12-07 DIAGNOSIS — I429 Cardiomyopathy, unspecified: Secondary | ICD-10-CM

## 2012-12-07 DIAGNOSIS — I428 Other cardiomyopathies: Secondary | ICD-10-CM

## 2012-12-07 DIAGNOSIS — I442 Atrioventricular block, complete: Secondary | ICD-10-CM

## 2012-12-07 DIAGNOSIS — I447 Left bundle-branch block, unspecified: Secondary | ICD-10-CM

## 2012-12-07 DIAGNOSIS — I5022 Chronic systolic (congestive) heart failure: Secondary | ICD-10-CM

## 2012-12-07 DIAGNOSIS — R001 Bradycardia, unspecified: Secondary | ICD-10-CM

## 2012-12-07 LAB — CBC WITH DIFFERENTIAL/PLATELET
Basophils Absolute: 0 10*3/uL (ref 0.0–0.1)
Eosinophils Absolute: 0.2 10*3/uL (ref 0.0–0.7)
Lymphocytes Relative: 20.8 % (ref 12.0–46.0)
MCHC: 34.3 g/dL (ref 30.0–36.0)
Monocytes Relative: 4.4 % (ref 3.0–12.0)
Neutrophils Relative %: 71.2 % (ref 43.0–77.0)
RBC: 4.85 Mil/uL (ref 4.22–5.81)
RDW: 14.1 % (ref 11.5–14.6)

## 2012-12-07 LAB — BASIC METABOLIC PANEL
CO2: 31 mEq/L (ref 19–32)
Calcium: 9.3 mg/dL (ref 8.4–10.5)
Creatinine, Ser: 0.9 mg/dL (ref 0.4–1.5)
GFR: 93.15 mL/min (ref >60.00)
Glucose, Bld: 100 mg/dL — ABNORMAL HIGH (ref 70–99)

## 2012-12-07 LAB — PROTIME-INR
INR: 1.1 ratio — ABNORMAL HIGH (ref 0.8–1.0)
Prothrombin Time: 11.7 s (ref 10.2–12.4)

## 2012-12-07 NOTE — Assessment & Plan Note (Signed)
As above.

## 2012-12-07 NOTE — Patient Instructions (Addendum)
CRT or cardiac resynchronization therapy is a treatment used to correct heart failure. When you have heart failure your heart is weakened and doesn't pump as well as it should. This therapy may help reduce symptoms and improve the quality of life.  Please see the handout/brochure given to you today to get more information of the different options   Your physician recommends that you have lab work today: BMET/CBCD/INR  Your physician has recommended you make the following change in your medication:  1) Stop Digoxin    Please call Dory Horn RN with any questions/concerns [phone]

## 2012-12-07 NOTE — Assessment & Plan Note (Signed)
The patient has a cardiomyopathy which is nonischemic and possibly familial.  The first order of business is risk reduction which require beta blocker therapy presently precluded by intermittent heart block and sinus bradycardia. He also has class III congestive heart failure which may be partly related to his conduction system disease. He has significant nonsustained ventricular tachycardia and syncope. It is appropriate to pursue CRT-D implantation at this time.    Have reviewed the potential benefits and risks of ICD implantation including but not limited to death, perforation of heart or lung, lead dislodgement, infection,  device malfunction and inappropriate shocks.  The patient and family express understanding  and are willing to proceed.    This will allow resumption of beta blocker therapy.  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

## 2012-12-07 NOTE — Progress Notes (Signed)
 ELECTROPHYSIOLOGY CONSULT NOTE  Patient ID: [name], MRN: [id number], DOB/AGE: 11/27/1940 72 y.o. Admit date: (Not on file) Date of Consult: 12/07/2012  Primary Physician: DANIEL, TERRY, MD Primary Cardiologist: DB/SM Chief Complaint: ICD   HPI [name] is a 72 y.o. male  History of a cardiomyopathy dating back at least to 2003 at which time her ejection fraction was measured at 40-45%. In 2007 it was about 50%. He presented with palpitations and syncope earlier this summer assessment of left ventricular function demonstrated an EF of 20%. Catheterization demonstrated no ischemic heart disease. He was found to have left bundle branch block. During his hospitalization he had nonsustained ventricular tachycardia and he was discharged on a LiveVest as well as guideline directed medical therapy. However, it had come off of his beta blocker because of intermittent complete heart block and profound bradycardia.  His syncopal episode was on associated with a prodrome that lasted for minutes as his family had noted him to be walking/staggering. He also has a history of tachypalpitations not associated with lightheadedness.  He shortness of breath at less than one flight of stairs or less than 100 yards; he does not have nocturnal dyspnea. He does not have peripheral edema.  His family history is notable. Apparently I have put tachycardia device is in the 3 of them; one of them has undergone transplant. Genetic evaluation has not been consummated  Cardiopulmonary stress testing demonstrated PVO2 of 24    Past Medical History  Diagnosis Date  . Nephrolithiasis   . GERD (gastroesophageal reflux disease)   . Osteoarthritis   . Insomnia   . Nonischemic cardiomyopathy     Cardiac catheterization 12/2011 with LVEF 45%, no significant obstructive CAD      Surgical History:  Past Surgical History  Procedure Laterality Date  . Exploratory laparotomy    . Appendectomy    .  Volvulus reduction      2007  . Neck surgery      2001     Home Meds: Prior to Admission medications   Medication Sig Start Date End Date Taking? Authorizing Provider  B Complex Vitamins (B COMPLEX 50 PO) Take 1 tablet by mouth daily.   Yes Historical Provider, MD  Calcium Carbonate-Vitamin D (CALTRATE 600+D PO) Take 1 tablet by mouth daily.   Yes Historical Provider, MD  Cod Liver Oil 1000 MG CAPS Take 1 capsule by mouth daily.   Yes Historical Provider, MD  digoxin (LANOXIN) 0.125 MG tablet Take 0.125 mg by mouth daily.   Yes Historical Provider, MD  diphenhydrAMINE (BENADRYL) 25 MG tablet Take 25 mg by mouth at bedtime.    Yes Historical Provider, MD  Glucosamine Sulfate 1000 MG CAPS Take 1,000 mg by mouth daily.   Yes Historical Provider, MD  lisinopril (PRINIVIL,ZESTRIL) 5 MG tablet Take 2.5 mg by mouth daily. 10/27/12  Yes Daniel R Bensimhon, MD  Multiple Vitamin (MULTIVITAMIN) tablet Take 1 tablet by mouth daily.   Yes Historical Provider, MD  pantoprazole (PROTONIX) 40 MG tablet Take 40 mg by mouth daily.   Yes Historical Provider, MD  psyllium (REGULOID) 0.52 G capsule Take 0.52 g by mouth 2 (two) times daily.   Yes Historical Provider, MD  spironolactone (ALDACTONE) 25 MG tablet Take 12.5 mg by mouth daily.   Yes Historical Provider, MD  vitamin A 8000 UNIT capsule Take 8,000 Units by mouth daily.   Yes Historical Provider, MD  vitamin B-12 (CYANOCOBALAMIN) 1000 MCG tablet Take 1,000 mcg by   mouth 2 (two) times daily.   Yes Historical Provider, MD  vitamin C (ASCORBIC ACID) 500 MG tablet Take 500 mg by mouth daily.   Yes Historical Provider, MD  zolpidem (AMBIEN) 10 MG tablet Take 5 mg by mouth at bedtime.    Yes Historical Provider, MD      Allergies:  Allergies  Allergen Reactions  . Morphine And Related     History   Social History  . Marital Status: Married    Spouse Name: N/A    Number of Children: N/A  . Years of Education: N/A   Occupational History  . Not on  file.   Social History Main Topics  . Smoking status: Never Smoker   . Smokeless tobacco: Current User    Types: Chew  . Alcohol Use: No  . Drug Use: No  . Sexual Activity: Not on file   Other Topics Concern  . 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 Married 51 years and live in [place], [place].     Family History  Problem Relation Age of Onset  . Cancer - Prostate Maternal Uncle   . Heart failure Mother   . Stroke Mother   . Asthma Mother   . Heart disease Father   . Heart failure Father   . Congestive Heart Failure Brother     @ 62 Heart transplant, [place]  . Congestive Heart Failure Brother     ICD  . Heart failure Sister     ICD  . Cancer - Ovarian Sister     deceased @ 48     ROS:  Please see the history of present illness.   Negative except Impending cataracts  All other systems reviewed and negative.    Physical Exam:   Blood pressure 118/68, pulse 44, height 6' 1" (1.854 m), weight 187 lb (84.823 kg). General: Well developed, well nourished male in no acute distress. Head: Normocephalic, atraumatic, sclera non-icteric, no xanthomas, nares are without discharge. EENT: normal Lymph Nodes:  none Back: without scoliosis/kyphosis , no CVA tendersness Neck: Negative for carotid bruits. JVD not elevated. Lungs: Clear bilaterally to auscultation without wheezes, rales, or rhonchi. Breathing is unlabored. Heart: RRR with S1 S2. No murmur , rubs, or gallops appreciated. Abdomen: Soft, non-tender, non-distended with normoactive bowel sounds. No hepatomegaly. No rebound/guarding. No obvious abdominal masses. Msk:  Strength and tone appear normal for age. Extremities: No clubbing or cyanosis. No edema.  Distal pedal pulses are 2+ and equal bilaterally. Skin: Warm and Dry Neuro: Alert and oriented X 3. CN III-XII intact Grossly normal sensory and motor function . Psych:  Responds to questions appropriately with a normal  affect.      Labs: Cardiac Enzymes No results found for this basename: CKTOTAL, CKMB, TROPONINI,  in the last 72 hours CBC Lab Results  Component Value Date   WBC 4.6 10/27/2012   HGB 14.2 10/27/2012   HCT 42.1 10/27/2012   MCV 89.2 10/27/2012   PLT 94* 10/27/2012   PROTIME: No results found for this basename: LABPROT, INR,  in the last 72 hours Chemistry No results found for this basename: NA, K, CL, CO2, BUN, CREATININE, CALCIUM, LABALBU, PROT, BILITOT, ALKPHOS, ALT, AST, GLUCOSE,  in the last 168 hours Lipids No results found for this basename: CHOL, HDL, LDLCALC, TRIG   BNP No results found for this basename: probnp   Miscellaneous No results found for this basename: DDIMER      Radiology/Studies:  No results found.  EKG:  Sinus rhythm at 40 with left bundle-branch block. Intervals 30/14/40 Previous ECG demonstrates also dropped sequential P wavesthe wound was then stop it and  Assessment and Plan:   [name]   Sinus rhythm at 40 with left bundle-branch block. Intervals 30/14/40 Previous ECG demonstrates also dropped sequential P waves 

## 2012-12-07 NOTE — Assessment & Plan Note (Signed)
As above intermittent

## 2012-12-08 ENCOUNTER — Telehealth: Payer: Self-pay | Admitting: *Deleted

## 2012-12-08 NOTE — Telephone Encounter (Signed)
Called patient to discuss procedure instructions and clarification of implant device. Explained that I mailed out his letter this morning. Told him to call with any questions/concerns. Patient agreeable to plan.

## 2012-12-13 MED ORDER — CEFAZOLIN SODIUM-DEXTROSE 2-3 GM-% IV SOLR
2.0000 g | INTRAVENOUS | Status: DC
  Filled 2012-12-13: qty 50

## 2012-12-13 MED ORDER — SODIUM CHLORIDE 0.9 % IV SOLN: INTRAVENOUS | Status: DC

## 2012-12-13 MED ORDER — SODIUM CHLORIDE 0.9 % IR SOLN
80.0000 mg | Status: DC
  Filled 2012-12-13: qty 2

## 2012-12-14 ENCOUNTER — Encounter (HOSPITAL_COMMUNITY): Payer: Self-pay | Admitting: General Practice

## 2012-12-14 ENCOUNTER — Telehealth: Payer: Self-pay | Admitting: Internal Medicine

## 2012-12-14 ENCOUNTER — Encounter (HOSPITAL_COMMUNITY)
Admission: RE | Disposition: A | Discharge status: Home / Self Care | Payer: Self-pay | Source: Ambulatory Visit | Attending: Internal Medicine

## 2012-12-14 ENCOUNTER — Ambulatory Visit (HOSPITAL_COMMUNITY)
Admission: RE | Admit: 2012-12-14 | Discharge: 2012-12-15 | Disposition: A | Discharge status: Home / Self Care | Payer: Medicare Other | Source: Ambulatory Visit | Attending: Internal Medicine | Admitting: Internal Medicine

## 2012-12-14 DIAGNOSIS — I509 Heart failure, unspecified: Secondary | ICD-10-CM

## 2012-12-14 DIAGNOSIS — R001 Bradycardia, unspecified: Secondary | ICD-10-CM

## 2012-12-14 DIAGNOSIS — M1990 Unspecified osteoarthritis, unspecified site: Secondary | ICD-10-CM | POA: Insufficient documentation

## 2012-12-14 DIAGNOSIS — I429 Cardiomyopathy, unspecified: Secondary | ICD-10-CM

## 2012-12-14 DIAGNOSIS — I442 Atrioventricular block, complete: Secondary | ICD-10-CM | POA: Insufficient documentation

## 2012-12-14 DIAGNOSIS — R55 Syncope and collapse: Secondary | ICD-10-CM

## 2012-12-14 DIAGNOSIS — Z79899 Other long term (current) drug therapy: Secondary | ICD-10-CM | POA: Insufficient documentation

## 2012-12-14 DIAGNOSIS — N200 Calculus of kidney: Secondary | ICD-10-CM | POA: Insufficient documentation

## 2012-12-14 DIAGNOSIS — I428 Other cardiomyopathies: Secondary | ICD-10-CM

## 2012-12-14 DIAGNOSIS — I498 Other specified cardiac arrhythmias: Secondary | ICD-10-CM | POA: Insufficient documentation

## 2012-12-14 DIAGNOSIS — I447 Left bundle-branch block, unspecified: Secondary | ICD-10-CM | POA: Insufficient documentation

## 2012-12-14 DIAGNOSIS — I5022 Chronic systolic (congestive) heart failure: Secondary | ICD-10-CM | POA: Insufficient documentation

## 2012-12-14 DIAGNOSIS — K219 Gastro-esophageal reflux disease without esophagitis: Secondary | ICD-10-CM | POA: Insufficient documentation

## 2012-12-14 HISTORY — DX: Presence of automatic (implantable) cardiac defibrillator: Z95.810

## 2012-12-14 HISTORY — PX: BI-VENTRICULAR IMPLANTABLE CARDIOVERTER DEFIBRILLATOR: SHX5459

## 2012-12-14 HISTORY — PX: BI-VENTRICULAR IMPLANTABLE CARDIOVERTER DEFIBRILLATOR  (CRT-D): SHX5747

## 2012-12-14 HISTORY — DX: Personal history of other diseases of the digestive system: Z87.19

## 2012-12-14 LAB — SURGICAL PCR SCREEN: MRSA, PCR: NEGATIVE

## 2012-12-14 SURGERY — BI-VENTRICULAR IMPLANTABLE CARDIOVERTER DEFIBRILLATOR  (CRT-D)
Anesthesia: LOCAL

## 2012-12-14 MED ORDER — CHLORHEXIDINE GLUCONATE 4 % EX LIQD: 60.0000 mL | Freq: Once | CUTANEOUS | Status: DC

## 2012-12-14 MED ORDER — MUPIROCIN 2 % EX OINT: TOPICAL_OINTMENT | Freq: Two times a day (BID) | CUTANEOUS | Status: DC

## 2012-12-14 MED ORDER — MIDAZOLAM HCL 5 MG/5ML IJ SOLN
INTRAMUSCULAR | Status: AC
  Filled 2012-12-14: qty 5

## 2012-12-14 MED ORDER — ACETAMINOPHEN 325 MG PO TABS
325.0000 mg | ORAL_TABLET | ORAL | Status: DC | PRN
  Administered 2012-12-14 – 2012-12-15 (×2): 650 mg via ORAL
  Filled 2012-12-14 (×2): qty 2

## 2012-12-14 MED ORDER — CEFAZOLIN SODIUM 1-5 GM-% IV SOLN
1.0000 g | Freq: Four times a day (QID) | INTRAVENOUS | Status: AC
  Administered 2012-12-14 – 2012-12-15 (×3): 1 g via INTRAVENOUS
  Filled 2012-12-14 (×4): qty 50

## 2012-12-14 MED ORDER — SPIRONOLACTONE 12.5 MG HALF TABLET
12.5000 mg | ORAL_TABLET | Freq: Every day | ORAL | Status: DC
  Administered 2012-12-14 – 2012-12-15 (×2): 12.5 mg via ORAL
  Filled 2012-12-14 (×3): qty 1

## 2012-12-14 MED ORDER — ZOLPIDEM TARTRATE 5 MG PO TABS
5.0000 mg | ORAL_TABLET | Freq: Every day | ORAL | Status: DC
  Administered 2012-12-14: 5 mg via ORAL
  Filled 2012-12-14: qty 1

## 2012-12-14 MED ORDER — VITAMIN C 500 MG PO TABS
500.0000 mg | ORAL_TABLET | Freq: Every day | ORAL | Status: DC
  Administered 2012-12-14 – 2012-12-15 (×2): 500 mg via ORAL
  Filled 2012-12-14 (×3): qty 1

## 2012-12-14 MED ORDER — DIPHENHYDRAMINE HCL 25 MG PO TABS
25.0000 mg | ORAL_TABLET | Freq: Every day | ORAL | Status: DC
  Filled 2012-12-14 (×3): qty 1

## 2012-12-14 MED ORDER — FENTANYL CITRATE 0.05 MG/ML IJ SOLN
INTRAMUSCULAR | Status: AC
  Filled 2012-12-14: qty 2

## 2012-12-14 MED ORDER — PANTOPRAZOLE SODIUM 40 MG PO TBEC
40.0000 mg | DELAYED_RELEASE_TABLET | Freq: Every day | ORAL | Status: DC
  Administered 2012-12-14 – 2012-12-15 (×2): 40 mg via ORAL
  Filled 2012-12-14 (×2): qty 1

## 2012-12-14 MED ORDER — VITAMIN B-12 1000 MCG PO TABS
1000.0000 ug | ORAL_TABLET | Freq: Two times a day (BID) | ORAL | Status: DC
  Administered 2012-12-14 – 2012-12-15 (×2): 1000 ug via ORAL
  Filled 2012-12-14 (×4): qty 1

## 2012-12-14 MED ORDER — LISINOPRIL 2.5 MG PO TABS
2.5000 mg | ORAL_TABLET | Freq: Every day | ORAL | Status: DC
  Administered 2012-12-15: 09:00:00 2.5 mg via ORAL
  Filled 2012-12-14 (×2): qty 1

## 2012-12-14 MED ORDER — ONDANSETRON HCL 4 MG/2ML IJ SOLN: 4.0000 mg | Freq: Four times a day (QID) | INTRAMUSCULAR | Status: DC | PRN

## 2012-12-14 MED ORDER — SODIUM CHLORIDE 0.9 % IV SOLN: INTRAVENOUS | Status: AC

## 2012-12-14 MED ORDER — HEPARIN (PORCINE) IN NACL 2-0.9 UNIT/ML-% IJ SOLN
INTRAMUSCULAR | Status: AC
  Filled 2012-12-14: qty 500

## 2012-12-14 MED ORDER — LIDOCAINE HCL (PF) 1 % IJ SOLN
INTRAMUSCULAR | Status: AC
  Filled 2012-12-14: qty 60

## 2012-12-14 NOTE — CV Procedure (Signed)
[name] [id number]  [id number]  Preop Dx NICM/LBBB, 2AVB2,CHF CIII Postop Dx same/   Procedure: CRT-D Implantation  Cx: None   Dictation number [id number]  Sherryl Manges, MD 12/14/2012 6:00 PM

## 2012-12-14 NOTE — Interval H&P Note (Signed)
ICD Criteria  Current LVEF (within 6 months):25%  NYHA Functional Classification: Class III  Heart Failure History:  Yes, Duration of heart failure since onset is > 9 months  Non-Ischemic Dilated Cardiomyopathy History:  Yes, timeframe is > 9 months  Atrial Fibrillation/Atrial Flutter:  No.  Ventricular Tachycardia History:  No, n.  Cardiac Arrest History:  No  History of Syndromes with Risk of Sudden Death:  No.  Previous ICD:  No.  Electrophysiology Study: No.  Anticoagulation Therapy:  Patient is NOT on anticoagulation therapy.   Beta Blocker Therapy:  No, bradycardia  Ace Inhibitor/ARB Therapy:  Yes.  History and Physical Interval Note:  12/14/2012 3:09 PM  [name]  has presented today for surgery, with the diagnosis of hf  The various methods of treatment have been discussed with the patient and family. After consideration of risks, benefits and other options for treatment, the patient has consented to  Procedure(s): BI-VENTRICULAR IMPLANTABLE CARDIOVERTER DEFIBRILLATOR  (CRT-D) (N/A) as a surgical intervention .  The patient's history has been reviewed, patient examined, no change in status, stable for surgery.  I have reviewed the patient's chart and labs.  Questions were answered to the patient's satisfaction.     Sherryl Manges

## 2012-12-14 NOTE — Progress Notes (Signed)
1800 report received from Ramsey, RN from EP lab [phone]  Transferred in from EP LAb  Via bed with nurses

## 2012-12-14 NOTE — H&P (View-Only) (Signed)
ELECTROPHYSIOLOGY CONSULT NOTE  Patient ID: [name], MRN: [id number], DOB/AGE: 1940-04-13 72 y.o. Admit date: (Not on file) Date of Consult: 12/07/2012  Primary Physician: Donzetta Sprung, MD Primary Cardiologist: DB/SM Chief Complaint: ICD   HPI [name] is a 72 y.o. male  History of a cardiomyopathy dating back at least to 2003 at which time her ejection fraction was measured at 40-45%. In 2007 it was about 50%. He presented with palpitations and syncope earlier this summer assessment of left ventricular function demonstrated an EF of 20%. Catheterization demonstrated no ischemic heart disease. He was found to have left bundle branch block. During his hospitalization he had nonsustained ventricular tachycardia and he was discharged on a LiveVest as well as guideline directed medical therapy. However, it had come off of his beta blocker because of intermittent complete heart block and profound bradycardia.  His syncopal episode was on associated with a prodrome that lasted for minutes as his family had noted him to be walking/staggering. He also has a history of tachypalpitations not associated with lightheadedness.  He shortness of breath at less than one flight of stairs or less than 100 yards; he does not have nocturnal dyspnea. He does not have peripheral edema.  His family history is notable. Apparently I have put tachycardia device is in the 3 of them; one of them has undergone transplant. Genetic evaluation has not been consummated  Cardiopulmonary stress testing demonstrated PVO2 of 24    Past Medical History  Diagnosis Date  . Nephrolithiasis   . GERD (gastroesophageal reflux disease)   . Osteoarthritis   . Insomnia   . Nonischemic cardiomyopathy     Cardiac catheterization 12/2011 with LVEF 45%, no significant obstructive CAD      Surgical History:  Past Surgical History  Procedure Laterality Date  . Exploratory laparotomy    . Appendectomy    .  Volvulus reduction      2007  . Neck surgery      2001     Home Meds: Prior to Admission medications   Medication Sig Start Date End Date Taking? Authorizing Provider  B Complex Vitamins (B COMPLEX 50 PO) Take 1 tablet by mouth daily.   Yes Historical Provider, MD  Calcium Carbonate-Vitamin D (CALTRATE 600+D PO) Take 1 tablet by mouth daily.   Yes Historical Provider, MD  [facility] Liver Oil 1000 MG CAPS Take 1 capsule by mouth daily.   Yes Historical Provider, MD  digoxin (LANOXIN) 0.125 MG tablet Take 0.125 mg by mouth daily.   Yes Historical Provider, MD  diphenhydrAMINE (BENADRYL) 25 MG tablet Take 25 mg by mouth at bedtime.    Yes Historical Provider, MD  Glucosamine Sulfate 1000 MG CAPS Take 1,000 mg by mouth daily.   Yes Historical Provider, MD  lisinopril (PRINIVIL,ZESTRIL) 5 MG tablet Take 2.5 mg by mouth daily. 10/27/12  Yes Dolores Patty, MD  Multiple Vitamin (MULTIVITAMIN) tablet Take 1 tablet by mouth daily.   Yes Historical Provider, MD  pantoprazole (PROTONIX) 40 MG tablet Take 40 mg by mouth daily.   Yes Historical Provider, MD  psyllium (REGULOID) 0.52 G capsule Take 0.52 g by mouth 2 (two) times daily.   Yes Historical Provider, MD  spironolactone (ALDACTONE) 25 MG tablet Take 12.5 mg by mouth daily.   Yes Historical Provider, MD  vitamin A 8000 UNIT capsule Take 8,000 Units by mouth daily.   Yes Historical Provider, MD  vitamin B-12 (CYANOCOBALAMIN) 1000 MCG tablet Take 1,000 mcg by  mouth 2 (two) times daily.   Yes Historical Provider, MD  vitamin C (ASCORBIC ACID) 500 MG tablet Take 500 mg by mouth daily.   Yes Historical Provider, MD  zolpidem (AMBIEN) 10 MG tablet Take 5 mg by mouth at bedtime.    Yes Historical Provider, MD      Allergies:  Allergies  Allergen Reactions  . Morphine And Related     History   Social History  . Marital Status: Married    Spouse Name: N/A    Number of Children: N/A  . Years of Education: N/A   Occupational History  . Not on  file.   Social History Main Topics  . Smoking status: Never Smoker   . Smokeless tobacco: Current User    Types: Chew  . Alcohol Use: No  . Drug Use: No  . Sexual Activity: Not on file   Other Topics Concern  . 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     Family History  Problem Relation Age of Onset  . Cancer - Prostate Maternal Uncle   . Heart failure Mother   . Stroke Mother   . Asthma Mother   . Heart disease Father   . Heart failure Father   . Congestive Heart Failure Brother     @ 62 Heart transplant, [place]  . Congestive Heart Failure Brother     ICD  . Heart failure Sister     ICD  . Cancer - Ovarian Sister     deceased @ 28     ROS:  Please see the history of present illness.   Negative except Impending cataracts  All other systems reviewed and negative.    Physical Exam:   Blood pressure 118/68, pulse 44, height 6\' 1"  (1.854 m), weight 187 lb (84.823 kg). General: Well developed, well nourished male in no acute distress. Head: Normocephalic, atraumatic, sclera non-icteric, no xanthomas, nares are without discharge. EENT: normal Lymph Nodes:  none Back: without scoliosis/kyphosis , no CVA tendersness Neck: Negative for carotid bruits. JVD not elevated. Lungs: Clear bilaterally to auscultation without wheezes, rales, or rhonchi. Breathing is unlabored. Heart: RRR with S1 S2. No murmur , rubs, or gallops appreciated. Abdomen: Soft, non-tender, non-distended with normoactive bowel sounds. No hepatomegaly. No rebound/guarding. No obvious abdominal masses. Msk:  Strength and tone appear normal for age. Extremities: No clubbing or cyanosis. No edema.  Distal pedal pulses are 2+ and equal bilaterally. Skin: Warm and Dry Neuro: Alert and oriented X 3. CN III-XII intact Grossly normal sensory and motor function . Psych:  Responds to questions appropriately with a normal  affect.      Labs: Cardiac Enzymes No results found for this basename: CKTOTAL, CKMB, TROPONINI,  in the last 72 hours CBC Lab Results  Component Value Date   WBC 4.6 10/27/2012   HGB 14.2 10/27/2012   HCT 42.1 10/27/2012   MCV 89.2 10/27/2012   PLT 94* 10/27/2012   PROTIME: No results found for this basename: LABPROT, INR,  in the last 72 hours Chemistry No results found for this basename: NA, K, CL, CO2, BUN, CREATININE, CALCIUM, LABALBU, PROT, BILITOT, ALKPHOS, ALT, AST, GLUCOSE,  in the last 168 hours Lipids No results found for this basename: CHOL, HDL, LDLCALC, TRIG   BNP No results found for this basename: probnp   Miscellaneous No results found for this basename: DDIMER  Radiology/Studies:  No results found.  EKG:  Sinus rhythm at 40 with left bundle-branch block. Intervals 30/14/40 Previous ECG demonstrates also dropped sequential P wavesthe wound was then stop it and  Assessment and Plan:   Sherryl Manges   Sinus rhythm at 40 with left bundle-branch block. Intervals 30/14/40 Previous ECG demonstrates also dropped sequential P waves

## 2012-12-15 ENCOUNTER — Ambulatory Visit (HOSPITAL_COMMUNITY): Payer: Medicare Other

## 2012-12-15 DIAGNOSIS — I5022 Chronic systolic (congestive) heart failure: Secondary | ICD-10-CM

## 2012-12-15 NOTE — Discharge Summary (Addendum)
ELECTROPHYSIOLOGY DISCHARGE SUMMARY    Patient ID: [name],  MRN: [id number], DOB/AGE: 71-Feb-1942 71 y.o.  Admit date: 12/14/2012 Discharge date: 12/15/2012  Primary Care Physician: Donzetta Sprung, MD Primary Cardiologist / EP: Diona Browner, MD / Graciela Husbands, MD  Primary Discharge Diagnosis:  1. NICM, EF 20%, s/p BiV ICD implantation for CRT and primary prevention of SCD 2. Transient CHB 3. LBBB 4. Chronic systolic HF, NYHA class III HF 5. Prior syncope  Secondary Discharge Diagnoses:  1. Osteoarthritis 2. Nephrolithiasis 3. GERD  Procedures This Admission:  1. BiV ICD implantation ([employer]) - please see operative report for full details (this is not available at the time of this dictation)  History and Hospital Course:  [name] is a 72 year old man with a NICM, EF 20%, chronic systolic HF, transient CHB, LBBB and prior syncope July 2014 who presented yesterday for BiV ICD implantation for CRT and primary prevention of SCD. [name] tolerated this procedure well without any immediate complication. He remains hemodynamically stable and afebrile. His chest xray shows stable lead placement without pneumothorax. His device interrogation shows normal BiV ICD function with stable lead parameters/measurements. His implant site is intact without significant bleeding or hematoma. He has been given discharge instructions including wound care and activity restrictions. He will follow-up in 10 days for wound check. There were no changes made to his medications. Dr. Graciela Husbands decided to hold off on adding a beta blocker at this time. Will consider adding in follow-up. He has been seen, examined and deemed stable for discharge today by Dr. Berton Mount. Of note, he was instructed not to drive until given clearance by Dr. Graciela Husbands (previously placed on driving restrictions after syncope in July 2014).  Physical Exam:  Vitals: Blood pressure 112/69, pulse 58, temperature 97.6 F (36.4 C),  temperature source Oral, resp. rate 20, height 6\' 2"  (1.88 m), weight 179 lb 7.3 oz (81.4 kg), SpO2 98.00%.  General: Well developed, well appearing 72 year old male in no acute distress.  Neck: Supple. JVD not elevated.  Lungs: Clear bilaterally to auscultation without wheezes, rales, or rhonchi. Breathing is unlabored.  Heart: RRR with S1 S2. No murmur, rub or gallop.  Abdomen: Soft, non-distended. Extremities: No clubbing or cyanosis. No edema.  Skin: Warm and dry. Left upper chest / implant site intact without bleeding or hematoma. Neuro: Alert and oriented X 3. No focal deficits.   Labs: Lab Results  Component Value Date   WBC 5.3 12/07/2012   HGB 14.6 12/07/2012   HCT 42.4 12/07/2012   MCV 87.3 12/07/2012   PLT 96.0* 12/07/2012      Component Value Date/Time   NA 141 12/07/2012 1220   K 4.8 12/07/2012 1220   CL 106 12/07/2012 1220   CO2 31 12/07/2012 1220   GLUCOSE 100* 12/07/2012 1220   BUN 13 12/07/2012 1220   CREATININE 0.9 12/07/2012 1220   CALCIUM 9.3 12/07/2012 1220   GFRNONAA 83* 11/17/2012 1132   GFRAA >90 11/17/2012 1132    Disposition:  The patient is being discharged in stable condition.  Follow-up:     Follow-up Information   Follow up with [facility] On 12/26/2012. (At 4:30 PM for wound check)    Specialty:  Cardiology   Contact information:   [address] [place] [address] [phone]      Follow up with [facility] On 01/05/2013. (At 2:40 PM)    Specialty:  Cardiology   Contact information:   [address] [id number] [place] [address] [phone]      Follow up with Sherryl Manges, MD In 3 months. (Our office will mail letter with appointment date and time)    Specialty:  Cardiology   Contact information:   [address] [place] [address] [phone]      Discharge Medications:    Medication List         B COMPLEX 50 PO    Take 1 tablet by mouth daily.     CALTRATE 600+D PO  Take 1 tablet by mouth daily.     Cod Liver Oil 1000 MG Caps  Take 1 capsule by mouth daily.     digoxin 0.125 MG tablet  Commonly known as:  LANOXIN  Take 0.125 mg by mouth daily.     Glucosamine Sulfate 1000 MG Caps  Take 1,000 mg by mouth daily.     lisinopril 5 MG tablet  Commonly known as:  PRINIVIL,ZESTRIL  Take 2.5 mg by mouth daily.     METAMUCIL PO  Take 1 tablet by mouth daily.     multivitamin tablet  Take 1 tablet by mouth daily.     pantoprazole 40 MG tablet  Commonly known as:  PROTONIX  Take 40 mg by mouth daily.     spironolactone 25 MG tablet  Commonly known as:  ALDACTONE  Take 12.5 mg by mouth daily.     vitamin A 8000 UNIT capsule  Take 8,000 Units by mouth daily.     vitamin B-12 1000 MCG tablet  Commonly known as:  CYANOCOBALAMIN  Take 1,000 mcg by mouth 2 (two) times daily.     vitamin C 500 MG tablet  Commonly known as:  ASCORBIC ACID  Take 500 mg by mouth daily.     zolpidem 10 MG tablet  Commonly known as:  AMBIEN  Take 5 mg by mouth at bedtime as needed for sleep.       Duration of Discharge Encounter: Greater than 30 minutes including physician time.  Signed, Rick Duff, PA-C 12/15/2012, 2:52 PM

## 2012-12-15 NOTE — Progress Notes (Signed)
Utilization Review Completed Pola Furno J. Julieanne Hadsall, RN, BSN, NCM [phone]  

## 2012-12-15 NOTE — Op Note (Signed)
NAMEDAREN, YEAGLE NO.:  [id number]  MEDICAL RECORD NO.:  [id number]  LOCATION:  [id number]                        FACILITY:  [facility]  PHYSICIAN:  Duke Salvia, MD, FACCDATE OF BIRTH:  29-Aug-1940  DATE OF PROCEDURE:  12/14/2012 DATE OF DISCHARGE:                              OPERATIVE REPORT   PREOPERATIVE DIAGNOSES:  Nonischemic cardiomyopathy, sinus bradycardia, intermittent heart block, left bundle-branch block, class III congestive heart failure.  POSTOPERATIVE DIAGNOSES:  Nonischemic cardiomyopathy, sinus bradycardia, intermittent heart block, left bundle-branch block, class III congestive heart failure.  PROCEDURE:  Dual-chamber defibrillator implantation with left ventricular lead placement and defibrillator lead testing.  DESCRIPTION OF PROCEDURE:  Following obtaining informed consent, the patient was brought to the Electrophysiology Laboratory and placed on the fluoroscopic table in a supine position.  After routine prep and drape of the left upper chest, lidocaine was infiltrated in the prepectoral subclavicular region.  An incision was made and carried down to layer of the prepectoral fascia.  Using electrocautery and sharp dissection, a pocket was formed similarly.  Hemostasis was obtained.  Thereafter, attention was turned to gain access to the extrathoracic left subclavian vein, which was accomplished without difficulty, without the aspiration of air or puncture of the artery.  Three separate venipunctures were accomplished.  Guidewires were placed and retained and sequentially, a 9, 9.5, and 7-French sheath were placed through which were passed a Medtronic 6935 52 cm active fixation ventricular lead, serial #[id number] was entangled delta line and [id number], a Medtronic MB2 coronary sinus cannulation catheter (see below), and a Medtronic 5076 52 cm active fixation atrial lead, serial #[id number].  Under fluoroscopic guidance, the RV lead was  manipulated to the apex where the bipolar R-wave was 13.6 with a pace impedance of 1056, a threshold of 0.5 V at 0.5 msec.  Currently, threshold was 0.5 mA.  There was no diaphragmatic pacing at 10 V.  The current of injury was brisk. This lead was secured to the prepectoral fascia.  We then embarked on trying to get into the coronary sinus and spent more than an hour and 5 different sheaths before the [employer] Jude sheath successfully cannulated the coronary sinus.  We then deployed a Medtronic 4298 88 cm, LV lead serial #[id number] to a lateral vein where the lead distributed between basically over the midportion of the ventricle.  In this location, the 2, 3 poles were picked, and the amplitude was about 20 mV with a pacing impedance of, I do not remember, and a threshold of 0.5 V at 0.5 msec. There was no diaphragmatic pacing at 10 V.  At this point, the atrial lead was deployed to the right atrial appendage where the bipolar P-waves 2.8 with a pace impedance of 726, a threshold 1 V at 0.5.  Currently, the threshold was 1.3 mA.  There was no diaphragmatic pacing at 10 V and the current of injury was brisk. This lead was secured to the prepectoral fascia.  We then removed the LV delivery system without significant difficulty and secured to the lead in the prepectoral fascia.  The leads were then attached to Medtronic [facility] CRT-D device, serial #[id number].  Through the device, the  bipolar P-wave was 2.1 with a pace impedance of 530 and threshold 0.5 at 0.4.  The R-wave was 15.4, with a pace impedance of 589 with threshold 0.5 at 0.4 and the LV impedance was 42 with threshold 0.75 at 0.4 and LV/RV interval at the tip 2, 3 was about 90 to 100 msec.  The pocket was copiously irrigated with antibiotic containing saline solution.  Hemostasis was assured.  Leads in the pulse generator were placed in the pocket, secured to the prepectoral fascia.  Surgicel was placed at the cephalad aspect  of the pocket, and the pocket was then closed in 2 layers in normal fashion.  The wound was washed, dried, and a Dermabond dressing was applied.  Needle counts, sponge counts, and instrument counts were correct at the end of procedure according to staff.  The patient tolerated the procedure without apparent complication.     Duke Salvia, MD, [facility]     SCK/MEDQ  D:  12/14/2012  T:  12/15/2012  Job:  [id number]

## 2012-12-15 NOTE — Progress Notes (Signed)
Paged Dr. Graciela Husbands to get clarification that pt was being D/C.  Notified Dr. Graciela Husbands that pt needs D/C order.  Order put in, but AVS still needs orders reconciled before we are able to print it out.  Paged PA to get AVS fixed and no return phone call.  Paged Dr. Graciela Husbands again and the EP lab called back willing to take a message for Dr. Graciela Husbands.  Gave message to be passed on to Dr. Graciela Husbands.

## 2012-12-19 ENCOUNTER — Telehealth (HOSPITAL_COMMUNITY): Payer: Self-pay | Admitting: Cardiology

## 2012-12-19 NOTE — Telephone Encounter (Signed)
Pt called with concerns of low b/p readings over the weekend 10/11- SBP 126-73 10/12 b/p not taken 10/13 84/54            85/60           97/51 Pt does have a lot of dizziness no recent med changes

## 2012-12-19 NOTE — Telephone Encounter (Signed)
Mr [name] called complaining of low blood pressure and dizziness.   10/13 84/54 , 85/60, and 97/51 BP now 108/64   Discussed with Dr Gala Romney and he recommends holding spironolactone for the next 2 days. Plan to restart 12.5 mg spironolactone on Thursday 12/22/12  Mr [name] verbalized understanding.   Yutaka Holberg 1:19 PM

## 2012-12-26 ENCOUNTER — Ambulatory Visit: Payer: Medicare Other

## 2012-12-28 ENCOUNTER — Ambulatory Visit (INDEPENDENT_AMBULATORY_CARE_PROVIDER_SITE_OTHER): Payer: Medicare Other | Admitting: *Deleted

## 2012-12-28 DIAGNOSIS — R001 Bradycardia, unspecified: Secondary | ICD-10-CM

## 2012-12-28 DIAGNOSIS — I429 Cardiomyopathy, unspecified: Secondary | ICD-10-CM

## 2012-12-28 DIAGNOSIS — I428 Other cardiomyopathies: Secondary | ICD-10-CM

## 2012-12-28 DIAGNOSIS — I5022 Chronic systolic (congestive) heart failure: Secondary | ICD-10-CM

## 2012-12-28 DIAGNOSIS — I442 Atrioventricular block, complete: Secondary | ICD-10-CM

## 2012-12-28 DIAGNOSIS — I498 Other specified cardiac arrhythmias: Secondary | ICD-10-CM

## 2012-12-28 NOTE — Progress Notes (Signed)
Pt seen in device clinic for follow up of recently implanted CRT-D.  Wound well healed.  No redness, swelling, or edema.  Surgical already dissipated.   Device interrogated and found to be functioning normally.  No changes made today. See PaceArt for full details.  Pt denies chest pain, shortness of breath, palpitations, or dizziness.  Pt to follow up with Dr.Klein in 3 months.   [name] 12/28/2012 5:25 PM

## 2012-12-30 LAB — ICD DEVICE OBSERVATION
AL IMPEDENCE ICD: 475 Ohm
DEV-0020ICD: NEGATIVE
HV IMPEDENCE: 65 Ohm
LV LEAD IMPEDENCE ICD: 513 Ohm
LV LEAD THRESHOLD: 1 V
RV LEAD AMPLITUDE: 18.1 mv
RV LEAD IMPEDENCE ICD: 475 Ohm
RV LEAD THRESHOLD: 0.75 V
VENTRICULAR PACING ICD: 92.7 pct

## 2013-01-05 ENCOUNTER — Ambulatory Visit (HOSPITAL_COMMUNITY)
Admission: RE | Admit: 2013-01-05 | Discharge: 2013-01-05 | Disposition: A | Discharge status: Home / Self Care | Payer: Medicare Other | Source: Ambulatory Visit | Attending: Internal Medicine | Admitting: Internal Medicine

## 2013-01-05 VITALS — BP 90/52 | HR 70 | Wt 184.2 lb

## 2013-01-05 DIAGNOSIS — I5022 Chronic systolic (congestive) heart failure: Secondary | ICD-10-CM

## 2013-01-05 NOTE — Progress Notes (Signed)
Patient ID: [name], male   DOB: 1940/12/25, 72 y.o.   MRN: [id number]  Weight Range   Baseline proBNP    Referring Physician: Dr. Diona Browner  Primary Care: Dr. Almond Lint [facility])  Primary Cardiologist:   HPI:  [name] is a pleasant 72 yo male with a h/o familial cardiomyopathy and LBBB who was referred to the clinic today by Dr. Diona Browner for a recent decrease in his EF. In 2003 he had an echo with EF 45%. ECHO in 2007 which showed EF 50-55%.   6 siblings 3 boys 3 girls  2 brothers: 1 with transplant (had frequent VT), other with HF and ICD  1 sister with CHF and ICD  1 sister with ovarian cancer  1 sister OK  No other known family member with CM   Was doing well until July 2014 when he was outside and bent over to turn off spigot and got very dizzy and he fell over and caught himself before he hit his face on a rock. He denied LOC. Family said he was confused for a bit. No palpitations , CP or seizure activity. No further episodes. Had a repeat ECHO showing his EF now 25% with diffuse HK, mild MR, and mild TR. He has a family history of NICM (mother and brother) and his brother is s/p cardiac transplantation. He had a 24 holter monitor placed showing SR 41-112 with average 64 bpm, frequent PVCs with some NSVT. About 8% beats PVCs, also had some pauses ranging 2.0-2.8 seconds. He was then referred to HF clinic.  R/LHC 11/02/2012 RA = 5  RV = 41/4/8  PA = 39/11 (27)  PCW = 21 v = 35  Fick cardiac output/index = 3.6/1.7  Thermo CO/CI = 3.2/1.5  PVR = 1.6 Woods  SVR = 2075  FA sat = 96%  PA sat = 57%, 60%  Ao Pressure: 115/69 (88)  LV Pressure: 118/18/22  There was no signficant gradient across the aortic valve on pullback.  Left main: Normal  LAD: Large vessel gives off 1 diagonal. 20-30% proximal lesion. Otherwise normal  LCX: Small ramus. Tiny OM-1. Moderate-sized OM-2. Normal  RCA: Dominant. Normal.  LV-gram done in the RAO projection: Ejection fraction = 15%  severe global HK. I do not see clear MR jet  Assessment:  1. Minimal CAD  2. Severe LV dysfunction with low output physiology  3. Significant v-waves in PCWP tracing suggesting of MR   CPX 11/14/2012 Resting HR: 52 Peak HR: 137 (92% age predicted max HR) BP rest: 112/76 BP peak: 140/77 Peak VO2: 24.4 (90.4% predicted peak VO2) VE/VCO2 slope: 29.3 OUES: 2.29 Peak RER: 1.18 Ventilatory Threshold: 18.4 (68.2% predicted peak VO2)  S/P Medtronic BiViCD 12/14/12   He returns for follow up. Previously carvedilol stopped due to hypotension and bradycardia. Since last visit Spironolactone was cut back to a half a tablet due to hypotension. SBP 90-110. Denies SOB/PND/Orthopnea. Does to admit to dyspnea going up hills. Dizziness when bending over. SOB on walking to mailbox. Weight at home 184-194 pounds.    He can only tolerate 2.5 mg Lisinopril   Labs (8/14): K 4.8, creatinine 0.9          (9/14): K 4.2, creatinine 0.91  (12/07/12)  K 4.8 Creatinine 0.9   SH: Lives with his wife in [place]  ROS: All systems negative except as listed in HPI, PMH and Problem List.  Past Medical History  Diagnosis Date  . Nephrolithiasis   . GERD (  gastroesophageal reflux disease)   . Insomnia   . Nonischemic cardiomyopathy     Cardiac catheterization 12/2011 with LVEF 45%, no significant obstructive CAD  . Automatic implantable cardioverter-defibrillator in situ   . H/O hiatal hernia   . Osteoarthritis     "knees" (12/14/2012)    Current Outpatient Prescriptions  Medication Sig Dispense Refill  . B Complex Vitamins (B COMPLEX 50 PO) Take 1 tablet by mouth daily.      . Calcium Carbonate-Vitamin D (CALTRATE 600+D PO) Take 1 tablet by mouth daily.      Marland Kitchen Cod Liver Oil 1000 MG CAPS Take 1 capsule by mouth daily.      . digoxin (LANOXIN) 0.125 MG tablet Take 0.125 mg by mouth daily.      . Glucosamine Sulfate 1000 MG CAPS Take 1,000 mg by mouth daily.      Marland Kitchen lisinopril (PRINIVIL,ZESTRIL) 5 MG tablet Take 2.5  mg by mouth daily.      . Multiple Vitamin (MULTIVITAMIN) tablet Take 1 tablet by mouth daily.      . pantoprazole (PROTONIX) 40 MG tablet Take 40 mg by mouth daily.      . Psyllium (METAMUCIL PO) Take 1 tablet by mouth daily.      Marland Kitchen spironolactone (ALDACTONE) 25 MG tablet Take 12.5 mg by mouth daily.      . vitamin A 8000 UNIT capsule Take 8,000 Units by mouth daily.      . vitamin B-12 (CYANOCOBALAMIN) 1000 MCG tablet Take 1,000 mcg by mouth 2 (two) times daily.      . vitamin C (ASCORBIC ACID) 500 MG tablet Take 500 mg by mouth daily.      Marland Kitchen zolpidem (AMBIEN) 10 MG tablet Take 5 mg by mouth at bedtime as needed for sleep.       No current facility-administered medications for this encounter.    Filed Vitals:   01/05/13 1441 01/05/13 1445  BP: 86/52 90/52  Pulse: 70 70  Weight: 184 lb 4 oz (83.575 kg) 184 lb 4 oz (83.575 kg)  SpO2: 97% 97%    Physical Exam: General: Well appearing. No respiratory difficulty; wife present  HEENT: normal  Neck: supple. no JVD. Carotids 2+ bilat; no bruits. No lymphadenopathy or thryomegaly appreciated.  Cor: PMI laterally displaced. Regular rate & rhythm. 2/6 TR. No s3  ICD site healing well. + eschar Lungs: clear  Abdomen: soft, nontender, nondistended. No hepatosplenomegaly. No bruits or masses. Good bowel sounds.  Extremities: no cyanosis, clubbing, rash, edema  Neuro: alert & oriented x 3, cranial nerves grossly intact. moves all 4 extremities w/o difficulty. Affect pleasant.    ASSESSMENT & PLAN:  1) Chronic systolic HF, NYHA II, NICM; EF 25%  --difficult case. He has several markers of advanced HF with severely depressed EF, low output on RHC and intolerance of even low-dose HF meds and NYHA III symptoms.  However CPX test does not show a significant HF limitation.  --BP too low titrate meds further.- not on beta blocker due to hypotension and bradycardia.  -Volume status stable Continue 12.5 mg spironolactone  -Continue lisinopril 2.5 mg  daily. May need to stop if BP drops.  - Will see back in 2 months and consider repeating CPX testing. Given age do not want to wait much longer to refer for advanced therapies. If worse call me immediately.  -I did discuss case with [facility] regarding timing of visit to transplant clinic. We also discussed issue of genetic testing and need for  genetic counseling.   Total time spent is 50 minutes with over 3/4 of that time spent on the above items.    Arvilla Meres MD 3:16 PM

## 2013-01-05 NOTE — Patient Instructions (Signed)
Your physician recommends that you schedule a follow-up appointment in: 2 MONTHS  Do the following things EVERYDAY: 1) Weigh yourself in the morning before breakfast. Write it down and keep it in a log. 2) Take your medicines as prescribed 3) Eat low salt foods-Limit salt (sodium) to 2000 mg per day.  4) Stay as active as you can everyday 5) Limit all fluids for the day to less than 2 liters 6)   

## 2013-01-12 ENCOUNTER — Encounter: Payer: Self-pay | Admitting: Internal Medicine

## 2013-01-23 ENCOUNTER — Telehealth (HOSPITAL_COMMUNITY): Payer: Self-pay | Admitting: Cardiology

## 2013-01-23 NOTE — Telephone Encounter (Signed)
Spoke w/pt's wife, Sat BP 99/53, 105/69 Sun 90/58 pt felt horrible all day Mon 95/55, pt's wife states pt does take Lisinopril 2.5 mg in PM advised per last OV note can stop Lisinopril and see if he feels better pt's wife agreeable if doesn't feel better she will let us know

## 2013-01-23 NOTE — Telephone Encounter (Signed)
WIFE CALLED WITH B/P  READINGS 11/13 AM- L ARM 92/57 hR-67                  R ARM 96/57 hR-61  11/13 PM- L ARM 76/47 hR-54                   R ARM 88/50 hR-59  11/14 AM L ARM 95/56 hR- 74                 R ARM 110/54 hR- 45  11/15 AM L ARM 103/74 hR- 83                 R ARM 118/61 hR-48  11/16 AM L ARM 90/58 HR-74                 R ARM 103/72 HR- 62  11/17 AM L ARM 95/55 HR-63                 R ARM 101/57 HR 63 ONLY SYMPTOM IS INCREASED WEAKNESS AND FATIGUE

## 2013-03-07 ENCOUNTER — Telehealth (HOSPITAL_COMMUNITY): Payer: Self-pay | Admitting: Cardiology

## 2013-03-07 NOTE — Telephone Encounter (Signed)
Please provide safe parameters for B/P and HR pts has episodes where HR will drop to 38 and feels that 82 is too high B/p is always low, wife would like to know when she should call offfice   Please advsie

## 2013-03-07 NOTE — Telephone Encounter (Signed)
Left message to call back  

## 2013-03-08 NOTE — Telephone Encounter (Signed)
Spoke w/pt's wife gave parameters for HR and BP, she states HR usually 60-80s occ in the 50s and BP usually 80-100s she verbalizes understand and will call back for further questions

## 2013-03-29 ENCOUNTER — Ambulatory Visit (HOSPITAL_COMMUNITY)
Admission: RE | Admit: 2013-03-29 | Discharge: 2013-03-29 | Disposition: A | Discharge status: Home / Self Care | Payer: Medicare Other | Source: Ambulatory Visit | Attending: Cardiology | Admitting: Cardiology

## 2013-03-29 ENCOUNTER — Encounter (HOSPITAL_COMMUNITY): Payer: Self-pay

## 2013-03-29 VITALS — BP 98/76 | HR 72 | Wt 186.8 lb

## 2013-03-29 DIAGNOSIS — I429 Cardiomyopathy, unspecified: Secondary | ICD-10-CM

## 2013-03-29 DIAGNOSIS — I428 Other cardiomyopathies: Secondary | ICD-10-CM

## 2013-03-29 DIAGNOSIS — I5022 Chronic systolic (congestive) heart failure: Secondary | ICD-10-CM | POA: Insufficient documentation

## 2013-03-29 LAB — BASIC METABOLIC PANEL
BUN: 14 mg/dL (ref 6–23)
CHLORIDE: 105 meq/L (ref 96–112)
CO2: 24 mEq/L (ref 19–32)
CREATININE: 0.9 mg/dL (ref 0.50–1.35)
Calcium: 9 mg/dL (ref 8.4–10.5)
GFR, EST NON AFRICAN AMERICAN: 84 mL/min — AB (ref >90)
Glucose, Bld: 106 mg/dL — ABNORMAL HIGH (ref 70–99)
Potassium: 4.6 mEq/L (ref 3.7–5.3)
Sodium: 141 mEq/L (ref 137–147)

## 2013-03-29 LAB — PRO B NATRIURETIC PEPTIDE: Pro B Natriuretic peptide (BNP): 895.4 pg/mL — ABNORMAL HIGH (ref 0–125)

## 2013-03-29 MED ORDER — DIGOXIN 125 MCG PO TABS: 0.0625 mg | ORAL_TABLET | Freq: Every day | ORAL | Status: DC

## 2013-03-29 NOTE — Patient Instructions (Addendum)
Please schedule CPX  Take digoxin 0.0625 mg daily.   Follow up in 6 weeks to discuss CPX results   Do the following things EVERYDAY: 1) Weigh yourself in the morning before breakfast. Write it down and keep it in a log. 2) Take your medicines as prescribed 3) Eat low salt foods-Limit salt (sodium) to 2000 mg per day.  4) Stay as active as you can everyday 5) Limit all fluids for the day to less than 2 liters

## 2013-03-29 NOTE — Progress Notes (Signed)
Patient ID: [name], male   DOB: 1941/02/23, 73 y.o.   MRN: [id number]   Weight Range   Baseline proBNP    Referring Physician: Dr. Diona Browner  Primary Care: Dr. Almond Lint [facility])   HPI:  Mr. Guzzetta is a pleasant 73 yo male with a h/o familial cardiomyopathy and LBBB who was referred to the clinic by Dr. Diona Browner for a decrease in his EF. In 2003 he had an echo with EF 45%. ECHO in 2007 showed EF 50-55%.   6 siblings 3 boys 3 girls  2 brothers: 1 with transplant (had frequent VT), other with HF and ICD  1 sister with CHF and ICD  1 sister with ovarian cancer  1 sister OK  No other known family member with CM   Was doing well until July 2014 when he was outside and bent over to turn off spigot and got very dizzy and he fell over and caught himself before he hit his face on a rock. He denied LOC. Family said he was confused for a bit. No palpitations , CP or seizure activity. No further episodes. Had a repeat ECHO showing his EF now 25% with diffuse HK, mild MR, and mild TR. He has a family history of NICM (mother and brother) and his brother is s/p cardiac transplantation. He had a 24 holter monitor placed showing SR 41-112 with average 64 bpm, frequent PVCs with some NSVT. About 8% beats PVCs, also had some pauses ranging 2.0-2.8 seconds. He was then referred to HF clinic.  R/LHC 11/02/2012 RA = 5  RV = 41/4/8  PA = 39/11 (27)  PCW = 21 v = 35  Fick cardiac output/index = 3.6/1.7  Thermo CO/CI = 3.2/1.5  PVR = 1.6 Woods  SVR = 2075  FA sat = 96%  PA sat = 57%, 60%  Ao Pressure: 115/69 (88)  LV Pressure: 118/18/22  There was no signficant gradient across the aortic valve on pullback.  Left main: Normal  LAD: Large vessel gives off 1 diagonal. 20-30% proximal lesion. Otherwise normal  LCX: Small ramus. Tiny OM-1. Moderate-sized OM-2. Normal  RCA: Dominant. Normal.  LV-gram done in the RAO projection: Ejection fraction = 15% severe global HK. I do not see clear MR jet   Assessment:  1. Minimal CAD  2. Severe LV dysfunction with low output physiology  3. Significant v-waves in PCWP tracing suggesting of MR   CPX 11/14/2012 Resting HR: 52 Peak HR: 137 (92% age predicted max HR) BP rest: 112/76 BP peak: 140/77 Peak VO2: 24.4 (90.4% predicted peak VO2) VE/VCO2 slope: 29.3 OUES: 2.29 Peak RER: 1.18 Ventilatory Threshold: 18.4 (68.2% predicted peak VO2)  S/P Medtronic BiViCD 12/14/12   He returns for follow up with his wife and 2 daughters.  Dyspnea with inclines and going up steps.  No dyspnea on flat ground.  No orthopnea or PND.  No chest pain. Complains of ongoing dizziness. Ongoing leg fatigue.  Previously carvedilol stopped due to hypotension and bradycardia. Lisinopril stopped due to hypotension and dizziness. He has only been able to tolerate 12.5 mg spironolactone. He also had side effects on digoxin 0.125 mg daily but does not remember exactly what they were.  Weight at home 181-185 pounds.  Home SBP < 110.   Optivol- Fluid below threshold with impedance trending upwards. Activity ~3 hours per day.   Labs (8/14): K 4.8, creatinine 0.9          (9/14): K 4.2, creatinine 0.91  (12/07/12)  K 4.8 Creatinine 0.9             (03/29/13 ) K 4.6 Creatinine 0.9 Pro BNP 895  SH: Lives with his wife in [place].  Nonsmoker.   FH: 6 siblings 3 boys 3 girls         2 brothers: 1 with transplant (had frequent VT), other with HF and ICD         1 sister with CHF and ICD         1 sister with ovarian cancer         1 sister OK          No other known family member with CM   ROS: All systems negative except as listed in HPI, PMH and Problem List.  Past Medical History  Diagnosis Date  . Nephrolithiasis   . GERD (gastroesophageal reflux disease)   . Insomnia   . Nonischemic cardiomyopathy     Cardiac catheterization 12/2011 with LVEF 45%, no significant obstructive CAD  . Automatic implantable cardioverter-defibrillator in situ   . H/O hiatal hernia   .  Osteoarthritis     "knees" (12/14/2012)    Current Outpatient Prescriptions  Medication Sig Dispense Refill  . B Complex Vitamins (B COMPLEX 50 PO) Take 1 tablet by mouth daily.      . Calcium Carbonate-Vitamin D (CALTRATE 600+D PO) Take 1 tablet by mouth daily.      Marland Kitchen Cod Liver Oil 1000 MG CAPS Take 1 capsule by mouth daily.      . Glucosamine Sulfate 1000 MG CAPS Take 1,000 mg by mouth daily.      . Multiple Vitamin (MULTIVITAMIN) tablet Take 1 tablet by mouth daily.      . pantoprazole (PROTONIX) 40 MG tablet Take 40 mg by mouth daily.      . Psyllium (METAMUCIL PO) Take 1 tablet by mouth daily.      Marland Kitchen spironolactone (ALDACTONE) 25 MG tablet Take 12.5 mg by mouth daily.      . vitamin A 8000 UNIT capsule Take 8,000 Units by mouth daily.      . vitamin B-12 (CYANOCOBALAMIN) 1000 MCG tablet Take 1,000 mcg by mouth 2 (two) times daily.      . vitamin C (ASCORBIC ACID) 500 MG tablet Take 500 mg by mouth daily.      Marland Kitchen zolpidem (AMBIEN) 10 MG tablet Take 5 mg by mouth at bedtime as needed for sleep.       No current facility-administered medications for this encounter.    Filed Vitals:   03/29/13 1117  BP: 98/76  Pulse: 72  Weight: 186 lb 12.8 oz (84.732 kg)  SpO2: 98%    Physical Exam: General: Well appearing. No respiratory difficulty; wife and 2 daughters present   HEENT: normal  Neck: supple. no JVD. Carotids 2+ bilat; no bruits. No lymphadenopathy or thryomegaly appreciated.  Cor: PMI laterally displaced. Regular rate & rhythm. 2/6 TR. No s3  L upper chest. Scar Lungs: clear  Abdomen: soft, nontender, nondistended. No hepatosplenomegaly. No bruits or masses. Good bowel sounds.  Extremities: no cyanosis, clubbing, rash, edema  Neuro: alert & oriented x 3, cranial nerves grossly intact. moves all 4 extremities w/o difficulty. Affect pleasant.  ASSESSMENT & PLAN:  1) Chronic systolic HF:  NYHA II-IIB, nonischemic cardiomyopathy (probably familial); EF 25%.  Has Medtronic  BiV-ICD. Intolerant BB, digoxin, and ACEi due to dizziness and hypotension even with low doses. -Volume status stable, Lasix does not appear to  be needed. - Continue 12.5 mg spironolactone  - Try lower dose of digoxin, 0.0625 mg daily  - Optivol discussed and reviewed. No VT/VF. Activity ~3hours per day. Fluid index below threshold.  - Checked BMET and Pro BNP today ---> K 4.6 Creatinine 0.9 Pro BNP 895 - I am concerned that the patient's inability to tolerate cardiac medications is a sign of low output.  However, CPX last year was rather good and his symptoms appear NYHA class II.  Repeat CPX.  If cardiac function appears to have worsened, will consider advanced therapies.  By age, he is probably a bit above the threshold for transplant.    2) Familial cardiomyopathy: Will need to consider genetic testing/counseling for the family.    Follow up in 6 weeks to discuss CPX.   Marca AnconaDalton Loriann Bosserman 03/30/2013

## 2013-04-12 ENCOUNTER — Ambulatory Visit (HOSPITAL_COMMUNITY): Payer: Medicare Other | Attending: Internal Medicine

## 2013-04-12 ENCOUNTER — Encounter: Payer: Self-pay | Admitting: *Deleted

## 2013-04-12 DIAGNOSIS — I428 Other cardiomyopathies: Secondary | ICD-10-CM | POA: Insufficient documentation

## 2013-04-12 DIAGNOSIS — I5022 Chronic systolic (congestive) heart failure: Secondary | ICD-10-CM | POA: Insufficient documentation

## 2013-04-12 DIAGNOSIS — R0602 Shortness of breath: Secondary | ICD-10-CM

## 2013-05-11 ENCOUNTER — Encounter: Payer: Self-pay | Admitting: Internal Medicine

## 2013-05-11 ENCOUNTER — Ambulatory Visit (INDEPENDENT_AMBULATORY_CARE_PROVIDER_SITE_OTHER): Payer: Medicare Other | Admitting: Internal Medicine

## 2013-05-11 VITALS — BP 111/68 | HR 66 | Ht 74.0 in | Wt 191.0 lb

## 2013-05-11 DIAGNOSIS — R001 Bradycardia, unspecified: Secondary | ICD-10-CM

## 2013-05-11 DIAGNOSIS — I429 Cardiomyopathy, unspecified: Secondary | ICD-10-CM

## 2013-05-11 DIAGNOSIS — I5022 Chronic systolic (congestive) heart failure: Secondary | ICD-10-CM

## 2013-05-11 DIAGNOSIS — I498 Other specified cardiac arrhythmias: Secondary | ICD-10-CM

## 2013-05-11 DIAGNOSIS — I442 Atrioventricular block, complete: Secondary | ICD-10-CM

## 2013-05-11 LAB — MDC_IDC_ENUM_SESS_TYPE_INCLINIC
Battery Voltage: 3.02 V
Brady Statistic AP VP Percent: 57.04 %
Brady Statistic AP VS Percent: 0.79 %
Brady Statistic AS VS Percent: 6.58 %
Date Time Interrogation Session: 20150305130744
HighPow Impedance: 171 Ohm
HighPow Impedance: 61 Ohm
Lead Channel Impedance Value: 456 Ohm
Lead Channel Pacing Threshold Amplitude: 0.625 V
Lead Channel Pacing Threshold Pulse Width: 0.4 ms
Lead Channel Sensing Intrinsic Amplitude: 1.75 mV
Lead Channel Setting Pacing Amplitude: 2.25 V
Lead Channel Setting Pacing Pulse Width: 0.4 ms
Lead Channel Setting Sensing Sensitivity: 0.3 mV
MDC IDC MSMT BATTERY REMAINING LONGEVITY: 94 mo
MDC IDC MSMT LEADCHNL LV PACING THRESHOLD AMPLITUDE: 0.625 V
MDC IDC MSMT LEADCHNL RA PACING THRESHOLD PULSEWIDTH: 0.4 ms
MDC IDC MSMT LEADCHNL RA SENSING INTR AMPL: 2.125 mV
MDC IDC MSMT LEADCHNL RV IMPEDANCE VALUE: 513 Ohm
MDC IDC MSMT LEADCHNL RV PACING THRESHOLD AMPLITUDE: 0.625 V
MDC IDC MSMT LEADCHNL RV PACING THRESHOLD PULSEWIDTH: 0.4 ms
MDC IDC MSMT LEADCHNL RV SENSING INTR AMPL: 19.125 mV
MDC IDC MSMT LEADCHNL RV SENSING INTR AMPL: 22 mV
MDC IDC SET LEADCHNL LV PACING PULSEWIDTH: 0.4 ms
MDC IDC SET LEADCHNL RA PACING AMPLITUDE: 1.5 V
MDC IDC SET LEADCHNL RV PACING AMPLITUDE: 2.25 V
MDC IDC SET ZONE DETECTION INTERVAL: 360 ms
MDC IDC SET ZONE DETECTION INTERVAL: 400 ms
MDC IDC STAT BRADY AS VP PERCENT: 35.58 %
MDC IDC STAT BRADY RA PERCENT PACED: 57.83 %
MDC IDC STAT BRADY RV PERCENT PACED: 85.05 %
Zone Setting Detection Interval: 300 ms
Zone Setting Detection Interval: 350 ms

## 2013-05-11 NOTE — Progress Notes (Signed)
Patient Care Team: Richardean Chimera, MD as PCP - General (Unknown Physician Specialty)   HPI  [name] is a 73 y.o. 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 He has noted some discomfort in his left arm which is unassociated with exertion. He is having no edema.  Past Medical History  Diagnosis Date  . Nephrolithiasis   . GERD (gastroesophageal reflux disease)   . Insomnia   . Nonischemic cardiomyopathy     Cardiac catheterization 12/2011 with LVEF 45%, no significant obstructive CAD  . Automatic implantable cardioverter-defibrillator in situ   . H/O hiatal hernia   . Osteoarthritis     "knees" (12/14/2012)    Past Surgical History  Procedure Laterality Date  . Exploratory laparotomy  2006?  Marland Kitchen Appendectomy    . Volvulus reduction  2007  . Posterior fusion cervical spine  2001  . Bi-ventricular implantable cardioverter defibrillator  (crt-d)  12/14/2012  . Cardiac catheterization  2003?; ~ 11/2012  . Knee arthroscopy Left 1990's  . Lithotripsy      "twice" (12/14/2012)  . Cystoscopy w/ stone manipulation      "3-4 times" (12/14/2012)    Current Outpatient Prescriptions  Medication Sig Dispense Refill  . B Complex Vitamins (B COMPLEX 50 PO) Take 1 tablet by mouth daily.      . Calcium Carbonate-Vitamin D (CALTRATE 600+D PO) Take 1 tablet by mouth daily.      Marland Kitchen Cod Liver Oil 1000 MG CAPS Take 1 capsule by mouth daily.      . digoxin (LANOXIN) 0.125 MG tablet Take 0.5 tablets (0.0625 mg total) by mouth daily.  30 tablet  3  . Glucosamine Sulfate 1000 MG CAPS Take 1,000 mg by mouth daily.      . Multiple Vitamin (MULTIVITAMIN) tablet Take 1 tablet by mouth daily.      . pantoprazole (PROTONIX) 40 MG tablet Take 40 mg by mouth daily.      . Psyllium (METAMUCIL PO)  Take 1 tablet by mouth daily.      Marland Kitchen spironolactone (ALDACTONE) 25 MG tablet Take 12.5 mg by mouth daily.      . vitamin A 8000 UNIT capsule Take 8,000 Units by mouth daily.      . vitamin B-12 (CYANOCOBALAMIN) 1000 MCG tablet Take 1,000 mcg by mouth 2 (two) times daily.      . vitamin C (ASCORBIC ACID) 500 MG tablet Take 500 mg by mouth daily.      Marland Kitchen zolpidem (AMBIEN) 10 MG tablet Take 5 mg by mouth at bedtime as needed for sleep.       No current facility-administered medications for this visit.    Allergies  Allergen Reactions  . Morphine And Related     Review of Systems negative except from HPI and PMH  Physical Exam BP 111/68  Pulse 66  Ht 6\' 2"  (1.88 m)  Wt 191 lb (86.637 kg)  BMI 24.51 kg/m2 Well developed and well nourished in no acute distress HENT normal E scleral and icterus clear Neck Supple JVP flat; carotids brisk and full Clear to ausculation Device pocket well healed; without hematoma or erythema.  There is no tethering Regular rate and rhythm, no murmurs gallops or rub Soft with active bowel sounds No clubbing cyanosis none Edema Alert and oriented,  grossly normal motor and sensory function Skin Warm and Dry    Assessment and  Plan  Familial cardiomyopathy  Congestive heart failure-chronic systolic  Left bundle branch block  CRT-D-Medtronic  Symptomatic bradycardia  Ventricular ectopy-frequent   The patient is doing reasonably well. He has noted however that 15+ percent PVCs with a right bundle inferior axis morphology but with RS beats in lead aVL  To review with Dr. Shirlee LatchMcLean role of antiarrhythmic therapy but I would be inclined to try him on mexiletine to see if we can suppress his ventricular ectopy and see if this has any impact on O2 his capacity.  In addition, we have reviewed the family genetic tree. We will pursue testing in the family based on the left bundle branch block in the familial myopathy. He will need genetic counseling.

## 2013-05-11 NOTE — Patient Instructions (Addendum)
Your physician recommends that you continue on your current medications as directed. Please refer to the Current Medication list given to you today.  Remote monitoring is used to monitor your Pacemaker of ICD from home. This monitoring reduces the number of office visits required to check your device to one time per year. It allows [place] to keep an eye on the functioning of your device to ensure it is working properly. You are scheduled for a device check from home on 08/10/13. You may send your transmission at any time that day. If you have a wireless device, the transmission will be sent automatically. After your physician reviews your transmission, you will receive a postcard with your next transmission date.  Your physician wants you to follow-up in: 9 months with Dr. Graciela Husbands.  You will receive a reminder letter in the mail two months in advance. If you don't receive a letter, please call our office to schedule the follow-up appointment.

## 2013-05-23 ENCOUNTER — Encounter: Payer: Self-pay | Admitting: Internal Medicine

## 2013-07-24 ENCOUNTER — Other Ambulatory Visit: Payer: Self-pay | Admitting: Internal Medicine

## 2013-07-26 ENCOUNTER — Other Ambulatory Visit: Payer: Self-pay

## 2013-07-26 MED ORDER — SPIRONOLACTONE 25 MG PO TABS: ORAL_TABLET | ORAL | Status: DC

## 2013-08-10 ENCOUNTER — Encounter: Payer: Medicare Other | Admitting: *Deleted

## 2013-08-11 ENCOUNTER — Telehealth: Payer: Self-pay | Admitting: Internal Medicine

## 2013-08-11 ENCOUNTER — Telehealth: Payer: Self-pay | Admitting: Cardiology

## 2013-08-11 NOTE — Telephone Encounter (Signed)
Spoke with pt and reminded pt of remote transmission that is due today. Pt verbalized understanding.   

## 2013-08-11 NOTE — Telephone Encounter (Signed)
New Prob    Pt has

## 2013-08-14 ENCOUNTER — Encounter: Payer: Self-pay | Admitting: Cardiology

## 2013-09-27 ENCOUNTER — Encounter: Payer: Self-pay | Admitting: *Deleted

## 2013-10-02 ENCOUNTER — Telehealth: Payer: Self-pay | Admitting: *Deleted

## 2013-10-02 NOTE — Telephone Encounter (Signed)
Pt received letter about overdue device check. Pt's home monitor has not worked since Oct/2014. Pt attempted to send a manual transmission but encountered a four digit error code. I gave them tech svcs # and my direct line. They will call tech svcs for troubled shooting. They understand that if the remote will not send then device must be checked every 48months.

## 2014-01-25 ENCOUNTER — Encounter: Payer: Medicare Other | Admitting: Internal Medicine

## 2014-02-12 ENCOUNTER — Encounter: Payer: Self-pay | Admitting: Internal Medicine

## 2014-02-12 ENCOUNTER — Ambulatory Visit (INDEPENDENT_AMBULATORY_CARE_PROVIDER_SITE_OTHER): Payer: Medicare Other | Admitting: Internal Medicine

## 2014-02-12 VITALS — BP 92/58 | HR 63 | Ht 73.0 in | Wt 192.8 lb

## 2014-02-12 DIAGNOSIS — R001 Bradycardia, unspecified: Secondary | ICD-10-CM

## 2014-02-12 DIAGNOSIS — I447 Left bundle-branch block, unspecified: Secondary | ICD-10-CM

## 2014-02-12 DIAGNOSIS — I5022 Chronic systolic (congestive) heart failure: Secondary | ICD-10-CM

## 2014-02-12 DIAGNOSIS — I429 Cardiomyopathy, unspecified: Secondary | ICD-10-CM

## 2014-02-12 DIAGNOSIS — I442 Atrioventricular block, complete: Secondary | ICD-10-CM

## 2014-02-12 DIAGNOSIS — Z4502 Encounter for adjustment and management of automatic implantable cardiac defibrillator: Secondary | ICD-10-CM

## 2014-02-12 DIAGNOSIS — I428 Other cardiomyopathies: Secondary | ICD-10-CM

## 2014-02-12 LAB — MDC_IDC_ENUM_SESS_TYPE_INCLINIC
Battery Voltage: 2.99 V
Brady Statistic AP VP Percent: 49.92 %
Brady Statistic AS VS Percent: 5.24 %
Brady Statistic RA Percent Paced: 50.5 %
Brady Statistic RV Percent Paced: 91.63 %
Date Time Interrogation Session: 20151207163714
HighPow Impedance: 190 Ohm
HighPow Impedance: 61 Ohm
Lead Channel Impedance Value: 456 Ohm
Lead Channel Impedance Value: 456 Ohm
Lead Channel Pacing Threshold Amplitude: 0.5 V
Lead Channel Pacing Threshold Amplitude: 0.625 V
Lead Channel Pacing Threshold Pulse Width: 0.4 ms
Lead Channel Pacing Threshold Pulse Width: 0.4 ms
Lead Channel Sensing Intrinsic Amplitude: 20.875 mV
Lead Channel Sensing Intrinsic Amplitude: 3.375 mV
Lead Channel Setting Pacing Amplitude: 1.5 V
Lead Channel Setting Pacing Amplitude: 2 V
Lead Channel Setting Pacing Amplitude: 2 V
Lead Channel Setting Pacing Pulse Width: 0.4 ms
Lead Channel Setting Sensing Sensitivity: 0.3 mV
MDC IDC MSMT BATTERY REMAINING LONGEVITY: 86 mo
MDC IDC MSMT LEADCHNL RA PACING THRESHOLD AMPLITUDE: 0.625 V
MDC IDC MSMT LEADCHNL RV PACING THRESHOLD PULSEWIDTH: 0.4 ms
MDC IDC SET LEADCHNL RV PACING PULSEWIDTH: 0.4 ms
MDC IDC SET ZONE DETECTION INTERVAL: 300 ms
MDC IDC STAT BRADY AP VS PERCENT: 0.57 %
MDC IDC STAT BRADY AS VP PERCENT: 44.27 %
Zone Setting Detection Interval: 350 ms
Zone Setting Detection Interval: 360 ms
Zone Setting Detection Interval: 400 ms

## 2014-02-12 LAB — BASIC METABOLIC PANEL
BUN: 12 mg/dL (ref 6–23)
CO2: 27 mEq/L (ref 19–32)
Calcium: 8.9 mg/dL (ref 8.4–10.5)
Chloride: 109 mEq/L (ref 96–112)
Creatinine, Ser: 1 mg/dL (ref 0.4–1.5)
GFR: 74.56 mL/min (ref >60.00)
Glucose, Bld: 102 mg/dL — ABNORMAL HIGH (ref 70–99)
Potassium: 4.5 mEq/L (ref 3.5–5.1)
Sodium: 141 mEq/L (ref 135–145)

## 2014-02-12 MED ORDER — MEXILETINE HCL 200 MG PO CAPS: 200.0000 mg | ORAL_CAPSULE | Freq: Two times a day (BID) | ORAL | Status: DC

## 2014-02-12 NOTE — Patient Instructions (Addendum)
Your physician has recommended you make the following change in your medication:  1) START Mexiletine 200 mg daily  Labs today:  Potassium level  Remote monitoring is used to monitor your Pacemaker of ICD from home. This monitoring reduces the number of office visits required to check your device to one time per year. It allows [place] to keep an eye on the functioning of your device to ensure it is working properly. You are scheduled for a device check from home on 05/13/13. You may send your transmission at any time that day. If you have a wireless device, the transmission will be sent automatically. After your physician reviews your transmission, you will receive a postcard with your next transmission date.  Your physician wants you to follow-up in: 1 year with Dr. Graciela Husbands.  You will receive a reminder letter in the mail two months in advance. If you don't receive a letter, please call our office to schedule the follow-up appointment.

## 2014-02-12 NOTE — Progress Notes (Signed)
Patient Care Team: Richardean Chimera, MD as PCP - General (Unknown Physician Specialty)   HPI  [name] is a 73 y.o. 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 He has noted some discomfort in his left arm which is unassociated with exertion. He is having no edema.  ECG 10/14> 25%   He does a great deal of work. He does have easy somnolence if he sits down in a chair but this is after working 10 12 14  hrs.  Past Medical History  Diagnosis Date  . Nephrolithiasis   . GERD (gastroesophageal reflux disease)   . Insomnia   . Nonischemic cardiomyopathy     Cardiac catheterization 12/2011 with LVEF 45%, no significant obstructive CAD  . Automatic implantable cardioverter-defibrillator in situ   . H/O hiatal hernia   . Osteoarthritis     "knees" (12/14/2012)    Past Surgical History  Procedure Laterality Date  . Exploratory laparotomy  2006?  Marland Kitchen Appendectomy    . Volvulus reduction  2007  . Posterior fusion cervical spine  2001  . Bi-ventricular implantable cardioverter defibrillator  (crt-d)  12/14/2012  . Cardiac catheterization  2003?; ~ 11/2012  . Knee arthroscopy Left 1990's  . Lithotripsy      "twice" (12/14/2012)  . Cystoscopy w/ stone manipulation      "3-4 times" (12/14/2012)    Current Outpatient Prescriptions  Medication Sig Dispense Refill  . B Complex Vitamins (B COMPLEX 50 PO) Take 1 tablet by mouth daily.    . Calcium Carbonate-Vitamin D (CALTRATE 600+D PO) Take 1 tablet by mouth daily.    Marland Kitchen Cod Liver Oil 1000 MG CAPS Take 1 capsule by mouth daily.    . digoxin (LANOXIN) 0.125 MG tablet Take 0.5 tablets (0.0625 mg total) by mouth daily. 30 tablet 3  . Glucosamine Sulfate 1000 MG CAPS Take 1,000 mg by mouth daily.    . Multiple Vitamin (MULTIVITAMIN)  tablet Take 1 tablet by mouth daily.    . pantoprazole (PROTONIX) 40 MG tablet Take 40 mg by mouth daily.    . Psyllium (METAMUCIL PO) Take 1 tablet by mouth daily.    Marland Kitchen spironolactone (ALDACTONE) 25 MG tablet TAKE 1/2 TABLET BY MOUTH DAILY 90 tablet 0  . vitamin A 8000 UNIT capsule Take 8,000 Units by mouth daily.    . vitamin B-12 (CYANOCOBALAMIN) 1000 MCG tablet Take 1,000 mcg by mouth 2 (two) times daily.    . vitamin C (ASCORBIC ACID) 500 MG tablet Take 500 mg by mouth daily.    Marland Kitchen zolpidem (AMBIEN) 10 MG tablet Take 5 mg by mouth at bedtime as needed for sleep.     No current facility-administered medications for this visit.    Allergies  Allergen Reactions  . Morphine And Related Other (See Comments)    Goes crazy     Review of Systems negative except from HPI and PMH  Physical Exam BP 92/58 mmHg  Pulse 63  Ht 6\' 1"  (1.854 m)  Wt 192 lb 12.8 oz (87.454 kg)  BMI 25.44 kg/m2 Well developed and well nourished in no acute distress HENT normal E scleral and icterus clear Neck Supple JVP flat; carotids brisk and full Clear to ausculation Device pocket well healed; without hematoma or erythema.  There is no tethering Regular rate  and rhythm, no murmurs gallops or rub Soft with active bowel sounds No clubbing cyanosis none Edema Alert and oriented, grossly normal motor and sensory function Skin Warm and Dry    Assessment and  Plan  Familial cardiomyopathy  Congestive heart failure-chronic systolic  Left bundle branch block  CRT-D-Medtronic  Symptomatic bradycardia  Ventricular ectopy-frequent  Monitoring of high risk meds   The patient is doing reasonably well. He has noted however that 15+ percent PVCs with a right bundle inferior axis morphology but with RS beats in lead aVL  He has had intercurrent nonsustained ventricular tachycardia. We have reviewed MADIT RIT pacing criteria and will need his device programmed as it is.  With his impaired LV function,  we will undertake a mexiletine suppression trial at 200 mg twice daily. In the event that he can tolerate the drug we will then reassess PVC burden. Unfortunately, his ventricular pacing counter is going to underestimate his PVC burden  We will need to monitor his high risk medication i.e. digoxin with potassium level as well.  We have also reviewed the implications of the positive heme test identified in his sister with a lamin  Mutation [facility] .

## 2014-02-15 ENCOUNTER — Encounter (HOSPITAL_COMMUNITY): Payer: Self-pay | Admitting: Internal Medicine

## 2014-05-14 ENCOUNTER — Encounter: Payer: Medicare Other | Admitting: *Deleted

## 2014-05-17 ENCOUNTER — Ambulatory Visit (INDEPENDENT_AMBULATORY_CARE_PROVIDER_SITE_OTHER): Payer: Medicare Other | Admitting: *Deleted

## 2014-05-17 ENCOUNTER — Encounter: Payer: Self-pay | Admitting: Internal Medicine

## 2014-05-17 DIAGNOSIS — I429 Cardiomyopathy, unspecified: Secondary | ICD-10-CM

## 2014-05-17 DIAGNOSIS — I442 Atrioventricular block, complete: Secondary | ICD-10-CM | POA: Diagnosis not present

## 2014-05-17 DIAGNOSIS — I447 Left bundle-branch block, unspecified: Secondary | ICD-10-CM

## 2014-05-17 DIAGNOSIS — I428 Other cardiomyopathies: Secondary | ICD-10-CM

## 2014-05-17 DIAGNOSIS — I5022 Chronic systolic (congestive) heart failure: Secondary | ICD-10-CM

## 2014-05-17 DIAGNOSIS — R001 Bradycardia, unspecified: Secondary | ICD-10-CM

## 2014-05-17 LAB — MDC_IDC_ENUM_SESS_TYPE_INCLINIC
Battery Remaining Longevity: 79 mo
Battery Voltage: 2.97 V
Brady Statistic AS VP Percent: 49.08 %
Brady Statistic RA Percent Paced: 47.51 %
Brady Statistic RV Percent Paced: 94.21 %
HIGH POWER IMPEDANCE MEASURED VALUE: 60 Ohm
HighPow Impedance: 190 Ohm
Lead Channel Impedance Value: 475 Ohm
Lead Channel Pacing Threshold Amplitude: 0.625 V
Lead Channel Pacing Threshold Amplitude: 0.625 V
Lead Channel Pacing Threshold Amplitude: 0.625 V
Lead Channel Pacing Threshold Pulse Width: 0.4 ms
Lead Channel Sensing Intrinsic Amplitude: 1.625 mV
Lead Channel Sensing Intrinsic Amplitude: 18.125 mV
Lead Channel Setting Pacing Amplitude: 1.75 V
Lead Channel Setting Pacing Amplitude: 2 V
Lead Channel Setting Pacing Amplitude: 2.5 V
Lead Channel Setting Pacing Pulse Width: 0.4 ms
MDC IDC MSMT LEADCHNL LV PACING THRESHOLD PULSEWIDTH: 0.4 ms
MDC IDC MSMT LEADCHNL RA IMPEDANCE VALUE: 456 Ohm
MDC IDC MSMT LEADCHNL RA SENSING INTR AMPL: 2.375 mV
MDC IDC MSMT LEADCHNL RV IMPEDANCE VALUE: 456 Ohm
MDC IDC MSMT LEADCHNL RV PACING THRESHOLD PULSEWIDTH: 0.4 ms
MDC IDC MSMT LEADCHNL RV SENSING INTR AMPL: 19.75 mV
MDC IDC SESS DTM: 20160310124122
MDC IDC SET LEADCHNL LV PACING PULSEWIDTH: 0.4 ms
MDC IDC SET LEADCHNL RV SENSING SENSITIVITY: 0.3 mV
MDC IDC SET ZONE DETECTION INTERVAL: 360 ms
MDC IDC SET ZONE DETECTION INTERVAL: 400 ms
MDC IDC STAT BRADY AP VP PERCENT: 47.22 %
MDC IDC STAT BRADY AP VS PERCENT: 0.3 %
MDC IDC STAT BRADY AS VS PERCENT: 3.4 %
Zone Setting Detection Interval: 300 ms
Zone Setting Detection Interval: 350 ms

## 2014-05-17 NOTE — Progress Notes (Signed)
CRT-D device check in office. Thresholds and sensing consistent with previous device measurements. Lead impedance trends stable over time. No mode switch episodes recorded. 1 NSVT episode recorded x 9 bts @ 100/173. Patient bi-ventricularly pacing 97.4% of the time with 3.2% as VSRp. Patient states that he D/C'd Mexiletine due to dizziness---states that he spoke to SK about this. Device programmed with appropriate safety margins. Heart failure diagnostics reviewed and trends are stable for patient. Audible alerts demonstrated for patient. No changes made this session. Estimated longevity 6.9 years. Patient will follow up via [facility] on 6/13 and with [facility] in 02-2015. Patient education completed including shock plan.

## 2014-08-20 ENCOUNTER — Encounter: Payer: Medicare Other | Admitting: *Deleted

## 2014-08-20 ENCOUNTER — Telehealth: Payer: Self-pay | Admitting: Cardiology

## 2014-08-20 NOTE — Telephone Encounter (Signed)
Confirmed remote transmission w/ pt wife.   

## 2014-08-21 ENCOUNTER — Encounter: Payer: Self-pay | Admitting: Cardiology

## 2014-09-28 ENCOUNTER — Encounter: Payer: Self-pay | Admitting: Internal Medicine

## 2014-09-28 ENCOUNTER — Ambulatory Visit (INDEPENDENT_AMBULATORY_CARE_PROVIDER_SITE_OTHER): Payer: Medicare Other | Admitting: *Deleted

## 2014-09-28 DIAGNOSIS — I5022 Chronic systolic (congestive) heart failure: Secondary | ICD-10-CM

## 2014-09-28 DIAGNOSIS — I429 Cardiomyopathy, unspecified: Secondary | ICD-10-CM

## 2014-09-28 DIAGNOSIS — I442 Atrioventricular block, complete: Secondary | ICD-10-CM | POA: Diagnosis not present

## 2014-09-28 LAB — CUP PACEART INCLINIC DEVICE CHECK
Battery Remaining Longevity: 72 mo
Brady Statistic AP VP Percent: 48.06 %
Brady Statistic AP VS Percent: 0.48 %
Brady Statistic AS VP Percent: 44.33 %
Brady Statistic AS VS Percent: 7.14 %
Brady Statistic RA Percent Paced: 48.54 %
Brady Statistic RV Percent Paced: 89.73 %
HIGH POWER IMPEDANCE MEASURED VALUE: 190 Ohm
HIGH POWER IMPEDANCE MEASURED VALUE: 62 Ohm
Lead Channel Impedance Value: 418 Ohm
Lead Channel Pacing Threshold Amplitude: 0.75 V
Lead Channel Pacing Threshold Pulse Width: 0.4 ms
Lead Channel Pacing Threshold Pulse Width: 0.4 ms
Lead Channel Sensing Intrinsic Amplitude: 19.75 mV
Lead Channel Sensing Intrinsic Amplitude: 2.375 mV
Lead Channel Setting Pacing Amplitude: 1.75 V
Lead Channel Setting Pacing Amplitude: 2.5 V
Lead Channel Setting Pacing Pulse Width: 0.4 ms
Lead Channel Setting Sensing Sensitivity: 0.3 mV
MDC IDC MSMT BATTERY VOLTAGE: 2.98 V
MDC IDC MSMT LEADCHNL LV PACING THRESHOLD AMPLITUDE: 0.75 V
MDC IDC MSMT LEADCHNL RA PACING THRESHOLD PULSEWIDTH: 0.4 ms
MDC IDC MSMT LEADCHNL RV IMPEDANCE VALUE: 418 Ohm
MDC IDC MSMT LEADCHNL RV PACING THRESHOLD AMPLITUDE: 0.75 V
MDC IDC SESS DTM: 20160722093713
MDC IDC SET LEADCHNL RA PACING AMPLITUDE: 2 V
MDC IDC SET LEADCHNL RV PACING PULSEWIDTH: 0.4 ms
MDC IDC SET ZONE DETECTION INTERVAL: 300 ms
MDC IDC SET ZONE DETECTION INTERVAL: 350 ms
Zone Setting Detection Interval: 360 ms
Zone Setting Detection Interval: 400 ms

## 2014-09-28 NOTE — Progress Notes (Signed)
CRT-D check in clinic Normal device function Estimated longevity 6 years 1 nst episode lasting 8 beats Alert tones demonstrated for pt and pt aware to contact office if heard Unable to get [facility] to work and someone has went to house with no success Optivol stable at this time Changes: other 1:1 SVT turned on ROV 02-14-15 @ 1345 with SK in [place] office

## 2014-12-31 ENCOUNTER — Telehealth (HOSPITAL_COMMUNITY): Payer: Self-pay | Admitting: Vascular Surgery

## 2014-12-31 NOTE — Telephone Encounter (Signed)
Wife called and said that her husband has a hacky cough and fets short of breath after waling only 2-3 blocks for the past week. Primary care MD, Dr. Margretta Sidle discontinued his Spirolactone and put him on Lasix 40 mg in May 2016. He is not taking a potasium supplement.  Takes Caltrate She feels he might have a problem with fluid in his lungs.  She is not sure why Dr. Garner Nash changed him to Lasix. [facility] visit at [facility] 03[facility]2013 She is calling Dr. Garner Nash office to get clarification on Lasix dose and the reason he took him off Spirolactone.  Is also going to try to get into be seen by cardiology in [facility]

## 2014-12-31 NOTE — Telephone Encounter (Signed)
Mrs. Honts called the [facility] office requesting an appointment today with Dr. Diona Browner in regards to the cough and shortness of breath.  States this has been Going on for 5-6 days.  I called [place] office trying to schedule with Harriet Pho. Her schedule is full today. Will have someone review and see if patient can wait Until tomorrow with open schedule.

## 2014-12-31 NOTE — Telephone Encounter (Signed)
Will await Dr. Shirlee Latch recommendation

## 2014-12-31 NOTE — Telephone Encounter (Signed)
Please call pt wife about fluid pill pt was taking before he starting Spironlactone.. Please advise

## 2014-12-31 NOTE — Telephone Encounter (Signed)
Would try to arrange him CHF clinic followup as soon as we can.

## 2015-01-01 NOTE — Telephone Encounter (Signed)
Pt schedule for Friday at 940

## 2015-01-04 ENCOUNTER — Encounter (HOSPITAL_COMMUNITY): Payer: Self-pay

## 2015-01-04 ENCOUNTER — Ambulatory Visit (HOSPITAL_COMMUNITY)
Admission: RE | Admit: 2015-01-04 | Discharge: 2015-01-04 | Disposition: A | Discharge status: Home / Self Care | Payer: Medicare Other | Source: Ambulatory Visit | Attending: Cardiology | Admitting: Cardiology

## 2015-01-04 VITALS — BP 104/62 | HR 80 | Wt 194.5 lb

## 2015-01-04 DIAGNOSIS — I493 Ventricular premature depolarization: Secondary | ICD-10-CM | POA: Diagnosis not present

## 2015-01-04 DIAGNOSIS — M25562 Pain in left knee: Secondary | ICD-10-CM | POA: Diagnosis not present

## 2015-01-04 DIAGNOSIS — I429 Cardiomyopathy, unspecified: Secondary | ICD-10-CM | POA: Diagnosis not present

## 2015-01-04 DIAGNOSIS — Z8249 Family history of ischemic heart disease and other diseases of the circulatory system: Secondary | ICD-10-CM | POA: Insufficient documentation

## 2015-01-04 DIAGNOSIS — K219 Gastro-esophageal reflux disease without esophagitis: Secondary | ICD-10-CM | POA: Insufficient documentation

## 2015-01-04 DIAGNOSIS — Z95810 Presence of automatic (implantable) cardiac defibrillator: Secondary | ICD-10-CM | POA: Insufficient documentation

## 2015-01-04 DIAGNOSIS — I428 Other cardiomyopathies: Secondary | ICD-10-CM | POA: Insufficient documentation

## 2015-01-04 DIAGNOSIS — M25561 Pain in right knee: Secondary | ICD-10-CM | POA: Diagnosis not present

## 2015-01-04 DIAGNOSIS — Z79899 Other long term (current) drug therapy: Secondary | ICD-10-CM | POA: Diagnosis not present

## 2015-01-04 DIAGNOSIS — I255 Ischemic cardiomyopathy: Secondary | ICD-10-CM | POA: Diagnosis not present

## 2015-01-04 DIAGNOSIS — I5022 Chronic systolic (congestive) heart failure: Secondary | ICD-10-CM | POA: Insufficient documentation

## 2015-01-04 DIAGNOSIS — G8929 Other chronic pain: Secondary | ICD-10-CM | POA: Diagnosis not present

## 2015-01-04 MED ORDER — SPIRONOLACTONE 25 MG PO TABS: 12.5000 mg | ORAL_TABLET | Freq: Every day | ORAL | Status: DC

## 2015-01-04 MED ORDER — FUROSEMIDE 20 MG PO TABS: 20.0000 mg | ORAL_TABLET | ORAL | Status: DC

## 2015-01-04 MED ORDER — DIGOXIN 125 MCG PO TABS: 0.0625 mg | ORAL_TABLET | ORAL | Status: DC

## 2015-01-04 NOTE — Patient Instructions (Addendum)
Cardiopulmonary exercise test  Echocardiogram   Change Lasix 20 mg every other day  Start Spironolactone 12.5 mg daily  Start Digoxin 0.0625 mg every other day  Labs to be drawn by PCP  Follow up in 3 weeks  Cardiopulmonary Stress Test Cardiopulmonary stress testing looks at how your body uses oxygen and how it responds to exercise. The test will evaluate your:  Heart.  Lungs.  Muscles. INDICATIONS A cardiopulmonary stress test is usually indicated for the following:  Evaluation of unexplained shortness of breath.  Evaluation of exercise tolerance for a person with heart failure or coronary artery disease (CAD).  Evaluation of lung function. This is useful in patients with chronic obstructive pulmonary disease (COPD) or pulmonary hypertension.  To evaluate how fit you are.  To assess your eligibility for disability.  Preoperative evaluation for:  Heart surgery.  Lung surgery.  Heart/Lung transplants. BEFORE THE PROCEDURE  Do not eat for at least two hours prior to the test or as told by your caregiver.  Do not drink alcohol 24 hours prior to the test.  Do not smoke, drink caffeinated or carbonated beverages for at least 6 hours prior to the test.  Wear loose, comfortable clothing and shoes.  If you use an inhaler, you should bring it with you to the test. PROCEDURE  Electrodes will be attached to your chest. These will be attached to a monitor that will monitor your heart rate.  A nose clip will be applied to your nose.  A headpiece will be placed on your head. This will hold a breathing tube in place that you will breath through during the test.  You will exercise using either a bicycle or a treadmill.  Usually the study will begin with a few minutes of simple exercise followed by increased exercise.  Your blood pressure, heart rate and breathing will be monitored during and after the test. You will go home when your caregiver feels it is safe for you  to do so. SEEK IMMEDIATE MEDICAL CARE IF:  You develop pain or pressure in the following areas:  Chest.  Jaw or neck.  Between your shoulder blades.  Pain radiating down your left arm.  You develop nausea (feeling sick to your stomach).  You develop vomiting.  You develop fainting or dizziness.  You develop shortness of breath. MAKE SURE YOU:   Understand these instructions.  Will watch your condition.  Will get help right away if you are not doing well or get worse.   This information is not intended to replace advice given to you by your health care provider. Make sure you discuss any questions you have with your health care provider.   Document Released: 02/11/2009 Document Revised: 03/16/2014 Document Reviewed: 02/11/2009 Elsevier Interactive Patient Education 2016 [employer].  Echocardiogram An echocardiogram, or echocardiography, uses sound waves (ultrasound) to produce an image of your heart. The echocardiogram is simple, painless, obtained within a short period of time, and offers valuable information to your health care provider. The images from an echocardiogram can provide information such as:  Evidence of coronary artery disease (CAD).  Heart size.  Heart muscle function.  Heart valve function.  Aneurysm detection.  Evidence of a past heart attack.  Fluid buildup around the heart.  Heart muscle thickening.  Assess heart valve function. LET [facility] CARE PROVIDER KNOW ABOUT:  Any allergies you have.  All medicines you are taking, including vitamins, herbs, eye drops, creams, and over-the-counter medicines.  Previous problems you  or members of your family have had with the use of anesthetics.  Any blood disorders you have.  Previous surgeries you have had.  Medical conditions you have.  Possibility of pregnancy, if this applies. BEFORE THE PROCEDURE  No special preparation is needed. Eat and drink normally.  PROCEDURE   In order  to produce an image of your heart, gel will be applied to your chest and a wand-like tool (transducer) will be moved over your chest. The gel will help transmit the sound waves from the transducer. The sound waves will harmlessly bounce off your heart to allow the heart images to be captured in real-time motion. These images will then be recorded.  You may need an IV to receive a medicine that improves the quality of the pictures. AFTER THE PROCEDURE You may return to your normal schedule including diet, activities, and medicines, unless your health care provider tells you otherwise.   This information is not intended to replace advice given to you by your health care provider. Make sure you discuss any questions you have with your health care provider.   Document Released: 02/21/2000 Document Revised: 03/16/2014 Document Reviewed: 10/31/2012 Elsevier Interactive Patient Education [employer]

## 2015-01-04 NOTE — Progress Notes (Signed)
Patient ID: ENSAR AUNGST, male   DOB: [birth year], 74 y.o.   MRN: [id number] Referring Physician: Dr. Diona Browner  Primary Care: Dr. Donzetta Sprung [facility])   HPI:  Mr. Mowad is a pleasant 74 yo male with a h/o familial cardiomyopathy and LBBB who was referred to the clinic by Dr. Diona Browner for a decrease in his EF.  In 2003 he had an echo with EF 45%. ECHO in 2007 showed EF 50-55%.   6 siblings 3 boys, 3 girls  2 brothers: 1 with transplant (had frequent VT), other with HF and ICD  1 sister with CHF and ICD.  Sister found to have LMNA mutation.  1 sister with ovarian cancer  1 sister OK  Father with CHF  Was doing well until July 2014 when he was outside and bent over to turn off spigot and got very dizzy and he fell over and caught himself before he hit his face on a rock. He denied LOC. Family said he was confused for a bit. No palpitations , CP or seizure activity. No further episodes. Had a repeat ECHO in 7/14 showed EF 25%, diffuse HK, mild MR, and mild TR. He has a family history of NICM (mother and brother) and his brother is s/p cardiac transplantation. He had a 24 holter monitor placed showing SR 41-112 with average 64 bpm, frequent PVCs with some NSVT. About 8% beats PVCs, also had some pauses ranging 2.0-2.8 seconds. He was then referred to HF clinic.  R/LHC 11/02/2012 RA = 5  RV = 41/4/8  PA = 39/11 (27)  PCW = 21 v = 35  Fick cardiac output/index = 3.6/1.7  Thermo CO/CI = 3.2/1.5  PVR = 1.6 Woods  SVR = 2075  FA sat = 96%  PA sat = 57%, 60%  Ao Pressure: 115/69 (88)  LV Pressure: 118/18/22  There was no signficant gradient across the aortic valve on pullback.  Left main: Normal  LAD: Large vessel gives off 1 diagonal. 20-30% proximal lesion. Otherwise normal  LCX: Small ramus. Tiny OM-1. Moderate-sized OM-2. Normal  RCA: Dominant. Normal.  LV-gram done in the RAO projection: Ejection fraction = 15% severe global HK. I do not see clear MR jet  Assessment:  1. Minimal  CAD  2. Severe LV dysfunction with low output physiology  3. Significant v-waves in PCWP tracing suggesting of MR   CPX 11/14/2012 Resting HR: 52 Peak HR: 137 (92% age predicted max HR) BP rest: 112/76 BP peak: 140/77 Peak VO2: 24.4 (90.4% predicted peak VO2) VE/VCO2 slope: 29.3 OUES: 2.29 Peak RER: 1.18 Ventilatory Threshold: 18.4 (68.2% predicted peak VO2)  CPX 2/15 Peak VO2 21.7 VE/VCO2 slope 29 RER 1.12 Low normal functional capacity.   S/P Medtronic CRT-D 12/14/12   We have not seen him here for over a year.  At last appointment, I added digoxin. He is off this now and says that he thinks it made him feel "funny."  He has not been able to tolerate low doses of ACEI or beta blocker due to hypotension/orthostatic symptoms.  He was on spironolactone, but in 7/16, his PCP started him on Lasix for peripheral edema.  He stopped spironolactone.  He is now taking Lasix 20 mg daily.  He says that he has been feeling worse for about 2-3 weeks.  He has noted dyspnea walking about 1/2 mile on flat ground, daughter and wife say that he is short of breath after walking 2-3 blocks.  He is short of breath walking up  steps.  No orthopnea/PND.  No weight gain.  +Fatigue.  No chest pain.  No bendopnea.  He is also limited by chronic bilateral knee pain.   He was put on mexiletine by Dr Graciela Husbands for frequent PVCs with the thought that these were contributing to his cardiomyopathy.  However, he stopped it because it made him feel bad.  He does not feel palpitations.   Optivol: Fluid below threshold, impedance stable.  Labs (8/14): K 4.8, creatinine 0.9          (9/14): K 4.2, creatinine 0.91  (12/07/12)  K 4.8 Creatinine 0.9             (03/29/13 ) K 4.6 Creatinine 0.9 Pro BNP 895          (12/15): K 4.5, creatinine 1.0, BNP 895  SH: Lives with his wife in [place].  Nonsmoker.   FH: 6 siblings 3 boys 3 girls         2 brothers: 1 with transplant (had frequent VT), other with HF and ICD         1 sister  with CHF and ICD => LMNA cardiomyopathy diagnosed by genetic testing.         1 sister with ovarian cancer         1 sister OK          Father with CHF  ROS: All systems negative except as listed in HPI, PMH and Problem List.  Past Medical History  Diagnosis Date  . Nephrolithiasis   . GERD (gastroesophageal reflux disease)   . Insomnia   . Nonischemic cardiomyopathy (HCC)     Cardiac catheterization 12/2011 with LVEF 45%, no significant obstructive CAD  . Automatic implantable cardioverter-defibrillator in situ   . H/O hiatal hernia   . Osteoarthritis     "knees" (12/14/2012)    Current Outpatient Prescriptions  Medication Sig Dispense Refill  . B Complex Vitamins (B COMPLEX 50 PO) Take 1 tablet by mouth daily.    . Calcium Carbonate-Vitamin D (CALTRATE 600+D PO) Take 1 tablet by mouth daily.    Marland Kitchen Cod Liver Oil 1000 MG CAPS Take 1 capsule by mouth daily.    . Glucosamine Sulfate 1000 MG CAPS Take 1,000 mg by mouth daily.    Marland Kitchen mexiletine (MEXITIL) 200 MG capsule Take 1 capsule (200 mg total) by mouth 2 (two) times daily. 60 capsule 2  . Multiple Vitamin (MULTIVITAMIN) tablet Take 1 tablet by mouth daily.    . pantoprazole (PROTONIX) 40 MG tablet Take 40 mg by mouth daily.    . Psyllium (METAMUCIL PO) Take 1 tablet by mouth daily.    . vitamin A 8000 UNIT capsule Take 8,000 Units by mouth daily.    . vitamin B-12 (CYANOCOBALAMIN) 1000 MCG tablet Take 1,000 mcg by mouth 2 (two) times daily.    . vitamin C (ASCORBIC ACID) 500 MG tablet Take 500 mg by mouth daily.    Marland Kitchen zolpidem (AMBIEN) 10 MG tablet Take 5 mg by mouth at bedtime as needed for sleep.    Marland Kitchen digoxin (LANOXIN) 0.125 MG tablet Take 0.5 tablets (0.0625 mg total) by mouth every other day. 90 tablet 3  . furosemide (LASIX) 20 MG tablet Take 1 tablet (20 mg total) by mouth every other day. 90 tablet 3  . spironolactone (ALDACTONE) 25 MG tablet Take 0.5 tablets (12.5 mg total) by mouth daily. 90 tablet 3   No current  facility-administered medications for this encounter.  Filed Vitals:   01/04/15 0936  BP: 104/62  Pulse: 80  Weight: 194 lb 8 oz (88.225 kg)  SpO2: 97%    Physical Exam: General: Well appearing. No respiratory difficulty; wife and 2 daughters present   HEENT: normal  Neck: supple. no JVD. Carotids 2+ bilat; no bruits. No lymphadenopathy or thryomegaly appreciated.  Cor: PMI laterally displaced. Regular rate & rhythm. No murmur.  No S3/S4.  Lungs: clear  Abdomen: soft, nontender, nondistended. No hepatosplenomegaly. No bruits or masses. Good bowel sounds.  Extremities: no cyanosis, clubbing, rash, edema  Neuro: alert & oriented x 3, cranial nerves grossly intact. moves all 4 extremities w/o difficulty. Affect pleasant.  ASSESSMENT & PLAN:  1) Chronic systolic HF:  NYHA II-IIIa, nonischemic cardiomyopathy.  Sister with LMNA cardiomyopathy, I suspect this with him as well (think this is familial).  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  EF 25% on echo in 7/14.  Has Medtronic CRT-D device.  He does not appear volume overloaded by exam or Optivol.  I am concerned that his increased dyspnea and fatigue may be related to low output.  - He has been unable to take even low doses of beta blockers or ACEIs due to dizziness/orthostasis.  - He is off digoxin but willing to try again.  Will start him on 0.0625 mg every other day. - Restart 12.5 mg spironolactone.  - Can decrease Lasix to every other day since we're starting back on spironolactone.  - BMET/BNP/digoxin level in 10 days.  - I am going to arrange for CPX to reassess functional capacity.  He may have trouble with this given his knee pain.  If so, would probably move on to right heart cath.  - Needs repeat echo.  2) Familial cardiomyopathy: Sister with LMNA cardiomyopathy diagnosed by genetic testing.  I suspect that he has it also, would fit dilated cardiomyopathy with conduction system disease  (LBBB).  I think it would be reasonable to test him for confirmation and if positive, test his children for screening.  We discussed the implications of this today.  He is going to discuss with his daughters and let me know what they want to do at next appointment.  If they want, can arrange the genetic testing and counseling.  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  He was not able to tolerate mexiletine.  He has not been able to tolerate beta blockers.  Will have him followup with Dr Graciela Husbands for further therapeutic consideration.    Followup in 3 wks after CPX and echo.   Marca Ancona 01/04/2015

## 2015-01-09 ENCOUNTER — Ambulatory Visit (HOSPITAL_COMMUNITY): Payer: Medicare Other | Attending: Cardiology

## 2015-01-09 DIAGNOSIS — I5022 Chronic systolic (congestive) heart failure: Secondary | ICD-10-CM | POA: Diagnosis not present

## 2015-01-09 DIAGNOSIS — R0602 Shortness of breath: Secondary | ICD-10-CM | POA: Diagnosis not present

## 2015-01-11 ENCOUNTER — Other Ambulatory Visit: Payer: Self-pay

## 2015-01-11 ENCOUNTER — Ambulatory Visit (HOSPITAL_COMMUNITY): Payer: Medicare Other | Attending: Cardiology

## 2015-01-11 DIAGNOSIS — I071 Rheumatic tricuspid insufficiency: Secondary | ICD-10-CM | POA: Insufficient documentation

## 2015-01-11 DIAGNOSIS — I442 Atrioventricular block, complete: Secondary | ICD-10-CM | POA: Insufficient documentation

## 2015-01-11 DIAGNOSIS — I447 Left bundle-branch block, unspecified: Secondary | ICD-10-CM | POA: Insufficient documentation

## 2015-01-11 DIAGNOSIS — I340 Nonrheumatic mitral (valve) insufficiency: Secondary | ICD-10-CM | POA: Diagnosis not present

## 2015-01-11 DIAGNOSIS — R29898 Other symptoms and signs involving the musculoskeletal system: Secondary | ICD-10-CM | POA: Insufficient documentation

## 2015-01-11 DIAGNOSIS — I517 Cardiomegaly: Secondary | ICD-10-CM | POA: Diagnosis not present

## 2015-01-11 DIAGNOSIS — I272 Other secondary pulmonary hypertension: Secondary | ICD-10-CM | POA: Diagnosis not present

## 2015-01-11 DIAGNOSIS — I5022 Chronic systolic (congestive) heart failure: Secondary | ICD-10-CM | POA: Diagnosis present

## 2015-01-25 ENCOUNTER — Encounter (HOSPITAL_COMMUNITY): Payer: Self-pay

## 2015-01-25 ENCOUNTER — Ambulatory Visit (HOSPITAL_COMMUNITY)
Admission: RE | Admit: 2015-01-25 | Discharge: 2015-01-25 | Disposition: A | Discharge status: Home / Self Care | Payer: Medicare Other | Source: Ambulatory Visit | Attending: Cardiology | Admitting: Cardiology

## 2015-01-25 VITALS — BP 102/80 | HR 70 | Wt 189.1 lb

## 2015-01-25 DIAGNOSIS — Z8249 Family history of ischemic heart disease and other diseases of the circulatory system: Secondary | ICD-10-CM | POA: Diagnosis not present

## 2015-01-25 DIAGNOSIS — I429 Cardiomyopathy, unspecified: Secondary | ICD-10-CM

## 2015-01-25 DIAGNOSIS — M25561 Pain in right knee: Secondary | ICD-10-CM | POA: Diagnosis not present

## 2015-01-25 DIAGNOSIS — Z79899 Other long term (current) drug therapy: Secondary | ICD-10-CM | POA: Insufficient documentation

## 2015-01-25 DIAGNOSIS — I5022 Chronic systolic (congestive) heart failure: Secondary | ICD-10-CM | POA: Insufficient documentation

## 2015-01-25 DIAGNOSIS — I447 Left bundle-branch block, unspecified: Secondary | ICD-10-CM | POA: Insufficient documentation

## 2015-01-25 DIAGNOSIS — I493 Ventricular premature depolarization: Secondary | ICD-10-CM | POA: Diagnosis not present

## 2015-01-25 LAB — BRAIN NATRIURETIC PEPTIDE: B Natriuretic Peptide: 886.7 pg/mL — ABNORMAL HIGH (ref 0.0–100.0)

## 2015-01-25 LAB — BASIC METABOLIC PANEL
Anion gap: 4 — ABNORMAL LOW (ref 5–15)
BUN: 10 mg/dL (ref 6–20)
CALCIUM: 9.4 mg/dL (ref 8.9–10.3)
CO2: 28 mmol/L (ref 22–32)
Chloride: 109 mmol/L (ref 101–111)
Creatinine, Ser: 1.08 mg/dL (ref 0.61–1.24)
Glucose, Bld: 105 mg/dL — ABNORMAL HIGH (ref 65–99)
Potassium: 4.1 mmol/L (ref 3.5–5.1)
Sodium: 141 mmol/L (ref 135–145)

## 2015-01-25 LAB — DIGOXIN LEVEL: Digoxin Level: 0.5 ng/mL — ABNORMAL LOW (ref 0.8–2.0)

## 2015-01-25 MED ORDER — DIGOXIN 125 MCG PO TABS: 0.0625 mg | ORAL_TABLET | Freq: Every day | ORAL | Status: DC

## 2015-01-25 MED ORDER — SPIRONOLACTONE 25 MG PO TABS: 25.0000 mg | ORAL_TABLET | Freq: Every day | ORAL | Status: DC

## 2015-01-25 NOTE — Patient Instructions (Addendum)
Medications:  Increase Spironolactone 25 mg daily  Change Digoxin 0.625 mg daily  Stop Mexiletine  Labs today will call if abnormal   Follow up in 2 weeks with Dr Shirlee Latch

## 2015-01-26 NOTE — Progress Notes (Signed)
Patient ID: [name], male   DOB: [birth year], 74 y.o.   MRN: [id number] Referring Physician: Dr. Diona Browner  Primary Care: Dr. Donzetta Sprung [facility])   HPI:  [name] is a pleasant 74 yo male with a h/o familial cardiomyopathy and LBBB who was referred to the clinic by Dr. Diona Browner for a decrease in his EF.  In 2003 he had an echo with EF 45%. ECHO in 2007 showed EF 50-55%.   6 siblings 3 boys, 3 girls  2 brothers: 1 with transplant (had frequent VT), other with HF and ICD  1 sister with CHF and ICD.  Sister found to have LMNA mutation.  1 sister with ovarian cancer  1 sister OK  Father with CHF  Was doing well until July 2014 when he was outside and bent over to turn off spigot and got very dizzy and he fell over and caught himself before he hit his face on a rock. He denied LOC. Family said he was confused for a bit. No palpitations , CP or seizure activity. No further episodes. Had a repeat ECHO in 7/14 showed EF 25%, diffuse HK, mild MR, and mild TR. He has a family history of NICM (mother and brother) and his brother is s/p cardiac transplantation. He had a 24 holter monitor placed showing SR 41-112 with average 64 bpm, frequent PVCs with some NSVT. About 8% beats PVCs, also had some pauses ranging 2.0-2.8 seconds. He was then referred to HF clinic.  R/LHC 11/02/2012 RA = 5  RV = 41/4/8  PA = 39/11 (27)  PCW = 21 v = 35  Fick cardiac output/index = 3.6/1.7  Thermo CO/CI = 3.2/1.5  PVR = 1.6 Woods  SVR = 2075  FA sat = 96%  PA sat = 57%, 60%  Ao Pressure: 115/69 (88)  LV Pressure: 118/18/22  There was no signficant gradient across the aortic valve on pullback.  Left main: Normal  LAD: Large vessel gives off 1 diagonal. 20-30% proximal lesion. Otherwise normal  LCX: Small ramus. Tiny OM-1. Moderate-sized OM-2. Normal  RCA: Dominant. Normal.  LV-gram done in the RAO projection: Ejection fraction = 15% severe global HK. I do not see clear MR jet  Assessment:  1. Minimal  CAD  2. Severe LV dysfunction with low output physiology  3. Significant v-waves in PCWP tracing suggesting of MR   CPX 11/14/2012 Resting HR: 52 Peak HR: 137 (92% age predicted max HR) BP rest: 112/76 BP peak: 140/77 Peak VO2: 24.4 (90.4% predicted peak VO2) VE/VCO2 slope: 29.3 OUES: 2.29 Peak RER: 1.18 Ventilatory Threshold: 18.4 (68.2% predicted peak VO2)  CPX 2/15 Peak VO2 21.7 VE/VCO2 slope 29 RER 1.12 Low normal functional capacity.   Echo (11/16) EF 20-25%, restrictive diastolic function, RV normal size/systolic function, mild MR, PA systolic pressure 47 mmHg.    CPX (11/16) Peak VO2 14.8, VE/VCO2 slope 38.9, RER 0.96.  Submaximal due to knee pain. Moderate restrictive lung pattern, probably at least moderate HF limitation but not conclusive as submaximal.   S/P Medtronic CRT-D 12/14/12   He has not been able to tolerate low doses of ACEI or beta blocker due to hypotension/orthostatic symptoms.  At last appointment, I started him back on digoxin at low dose and spironolactone 12.5 mg daily.  He has been taking Lasix every other day.  He continues to have limiting right knee pain.  His CPX was submaximal because of this.  Family says that he "pants" with heavier exertion.  However, he  still goes out to do yardwork.  He does get short of breath with yardwork.  He is short of breath after walking 300-400 feet on flat ground or with walking up an incline.  Weight is down 5 lbs.    He was put on mexiletine by Dr Graciela Husbands for frequent PVCs with the thought that these were contributing to his cardiomyopathy.  However, he stopped it because it made him feel bad.  He does not feel palpitations.   Labs (8/14): K 4.8, creatinine 0.9          (9/14): K 4.2, creatinine 0.91  (12/07/12)  K 4.8 Creatinine 0.9             (03/29/13 ) K 4.6 Creatinine 0.9 Pro BNP 895          (12/15): K 4.5, creatinine 1.0, BNP 895  SH: Lives with his wife in [place].  Nonsmoker.   FH: 6 siblings 3 boys 3 girls          2 brothers: 1 with transplant (had frequent VT), other with HF and ICD         1 sister with CHF and ICD => LMNA cardiomyopathy diagnosed by genetic testing.         1 sister with ovarian cancer         1 sister OK          Father with CHF  ROS: All systems negative except as listed in HPI, PMH and Problem List.  Past Medical History  Diagnosis Date  . Nephrolithiasis   . GERD (gastroesophageal reflux disease)   . Insomnia   . Nonischemic cardiomyopathy (HCC)     Cardiac catheterization 12/2011 with LVEF 45%, no significant obstructive CAD  . Automatic implantable cardioverter-defibrillator in situ   . H/O hiatal hernia   . Osteoarthritis     "knees" (12/14/2012)    Current Outpatient Prescriptions  Medication Sig Dispense Refill  . B Complex Vitamins (B COMPLEX 50 PO) Take 1 tablet by mouth daily.    . Calcium Carbonate-Vitamin D (CALTRATE 600+D PO) Take 1 tablet by mouth daily.    Marland Kitchen Cod Liver Oil 1000 MG CAPS Take 1 capsule by mouth daily.    . digoxin (LANOXIN) 0.125 MG tablet Take 0.5 tablets (0.0625 mg total) by mouth daily. 90 tablet 3  . furosemide (LASIX) 20 MG tablet Take 1 tablet (20 mg total) by mouth every other day. 90 tablet 3  . Glucosamine Sulfate 1000 MG CAPS Take 1,000 mg by mouth daily.    . Multiple Vitamin (MULTIVITAMIN) tablet Take 1 tablet by mouth daily.    . pantoprazole (PROTONIX) 40 MG tablet Take 40 mg by mouth daily.    . Psyllium (METAMUCIL PO) Take 1 tablet by mouth daily.    Marland Kitchen spironolactone (ALDACTONE) 25 MG tablet Take 1 tablet (25 mg total) by mouth daily. 90 tablet 3  . vitamin A 8000 UNIT capsule Take 8,000 Units by mouth daily.    . vitamin B-12 (CYANOCOBALAMIN) 1000 MCG tablet Take 1,000 mcg by mouth 2 (two) times daily.    . vitamin C (ASCORBIC ACID) 500 MG tablet Take 500 mg by mouth daily.    Marland Kitchen zolpidem (AMBIEN) 10 MG tablet Take 5 mg by mouth at bedtime as needed for sleep.     No current facility-administered medications for this  encounter.    Filed Vitals:   01/25/15 1033  BP: 102/80  Pulse: 70  Weight: 189 lb 1.9  oz (85.784 kg)  SpO2: 98%    Physical Exam: General: Well appearing. No respiratory difficulty; wife and daughter present   HEENT: normal  Neck: supple. no JVD. Carotids 2+ bilat; no bruits. No lymphadenopathy or thryomegaly appreciated.  Cor: PMI laterally displaced. Regular rate & rhythm. No murmur.  No S3/S4.  Lungs: clear  Abdomen: soft, nontender, nondistended. No hepatosplenomegaly. No bruits or masses. Good bowel sounds.  Extremities: no cyanosis, clubbing, rash, edema  Neuro: alert & oriented x 3, cranial nerves grossly intact. moves all 4 extremities w/o difficulty. Affect pleasant.  ASSESSMENT & PLAN:  1) Chronic systolic HF:  NYHA III, nonischemic cardiomyopathy.  Sister with LMNA cardiomyopathy, I suspect this with him as well (think this is familial).  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  EF 20-25% on 11/16 echo.  Has Medtronic CRT-D device.  He does not appear volume overloaded by exam.  I am concerned that his increased dyspnea and fatigue may be related to low output.  CPX was submaximal due to knee pain but indicates at least moderate HF limitation.   - He has been unable to take even low doses of beta blockers or ACEIs due to dizziness/orthostasis.  - He has tolerated digoxin 0.0625 mg daily.  Will check digoxin level today.  - Increase spironolactone to 25 mg daily. BMET at followup in 2 wks.  - Continue every other day Lasix.  BMET today.   - I talked to him today about RHC.  I am concerned that his heart failure is worsening.  CPX was somewhat inconclusive as it was submaximal.  If low output on RHC, he would be a possible LVAD candidate (advanced age for transplant).  He wants to think about this, will return to clinic in 2 wks.  2) Familial cardiomyopathy: Sister with LMNA cardiomyopathy diagnosed by genetic testing.  I suspect that he  has it also, would fit dilated cardiomyopathy with conduction system disease (LBBB).  I think it would be reasonable to test him for confirmation and if positive, test his children for screening.  We discussed the implications of this again today.  He is still not sure about the testing.  If this is not done, his daughters should get screening echocardiograms. 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  He was not able to tolerate mexiletine.  He has not been able to tolerate beta blockers.  Will have him followup with Dr Graciela Husbands for further therapeutic consideration.    Followup in 2 weeks.   Marca Ancona 01/26/2015

## 2015-01-29 ENCOUNTER — Telehealth: Payer: Self-pay | Admitting: Cardiology

## 2015-01-29 NOTE — Telephone Encounter (Signed)
New Message  Daughter called ... States she was advised to have her and her sister come in and retrieve an ECHO for family history. Daughter wanted to know if it was needed now or later..Please call back to discuss.

## 2015-02-06 ENCOUNTER — Encounter: Payer: Self-pay | Admitting: Cardiology

## 2015-02-11 ENCOUNTER — Encounter: Payer: Self-pay | Admitting: Cardiology

## 2015-02-13 ENCOUNTER — Ambulatory Visit (HOSPITAL_COMMUNITY)
Admission: RE | Admit: 2015-02-13 | Discharge: 2015-02-13 | Disposition: A | Discharge status: Home / Self Care | Payer: Medicare Other | Source: Ambulatory Visit | Attending: Internal Medicine | Admitting: Internal Medicine

## 2015-02-13 ENCOUNTER — Encounter (HOSPITAL_COMMUNITY): Payer: Self-pay | Admitting: Internal Medicine

## 2015-02-13 VITALS — BP 106/68 | HR 70 | Wt 188.8 lb

## 2015-02-13 DIAGNOSIS — I493 Ventricular premature depolarization: Secondary | ICD-10-CM

## 2015-02-13 DIAGNOSIS — K219 Gastro-esophageal reflux disease without esophagitis: Secondary | ICD-10-CM | POA: Diagnosis not present

## 2015-02-13 DIAGNOSIS — I447 Left bundle-branch block, unspecified: Secondary | ICD-10-CM | POA: Insufficient documentation

## 2015-02-13 DIAGNOSIS — I472 Ventricular tachycardia: Secondary | ICD-10-CM | POA: Insufficient documentation

## 2015-02-13 DIAGNOSIS — I420 Dilated cardiomyopathy: Secondary | ICD-10-CM | POA: Diagnosis not present

## 2015-02-13 DIAGNOSIS — Z8249 Family history of ischemic heart disease and other diseases of the circulatory system: Secondary | ICD-10-CM | POA: Diagnosis not present

## 2015-02-13 DIAGNOSIS — Z79899 Other long term (current) drug therapy: Secondary | ICD-10-CM | POA: Diagnosis not present

## 2015-02-13 DIAGNOSIS — Z87442 Personal history of urinary calculi: Secondary | ICD-10-CM | POA: Diagnosis not present

## 2015-02-13 DIAGNOSIS — M25561 Pain in right knee: Secondary | ICD-10-CM | POA: Diagnosis not present

## 2015-02-13 DIAGNOSIS — I429 Cardiomyopathy, unspecified: Secondary | ICD-10-CM | POA: Diagnosis not present

## 2015-02-13 DIAGNOSIS — I951 Orthostatic hypotension: Secondary | ICD-10-CM | POA: Diagnosis not present

## 2015-02-13 DIAGNOSIS — R42 Dizziness and giddiness: Secondary | ICD-10-CM | POA: Diagnosis not present

## 2015-02-13 DIAGNOSIS — I5022 Chronic systolic (congestive) heart failure: Secondary | ICD-10-CM | POA: Diagnosis not present

## 2015-02-13 NOTE — Progress Notes (Signed)
ADVANCED HF CLINIC NOTE  Patient ID: [name], male   DOB: 1940/12/10, 74 y.o.   MRN: [id number] Referring Physician: Dr. Diona Browner  Primary Care: Dr. Donzetta Sprung [facility])   HPI:  [name] is a pleasant 74 yo male with a h/o familial cardiomyopathy and LBBB who was referred to the clinic by Dr. Diona Browner for a decrease in his EF.  In 2003 he had an echo with EF 45%. ECHO in 2007 showed EF 50-55%.   6 siblings 3 boys, 3 girls  2 brothers: 1 with transplant (had frequent VT), other with HF and ICD  1 sister with CHF and ICD.  Sister found to have LMNA mutation.  1 sister with ovarian cancer  1 sister OK  Father with CHF  Was doing well until July 2014 when he was outside and bent over to turn off spigot and got very dizzy and he fell over and caught himself before he hit his face on a rock. He denied LOC. Family said he was confused for a bit. No palpitations , CP or seizure activity. No further episodes. Had a repeat ECHO in 7/14 showed EF 25%, diffuse HK, mild MR, and mild TR. He has a family history of NICM (mother and brother) and his brother is s/p cardiac transplantation. He had a 24 holter monitor placed showing SR 41-112 with average 64 bpm, frequent PVCs with some NSVT. About 8% beats PVCs, also had some pauses ranging 2.0-2.8 seconds. He was then referred to HF clinic.  He has not been able to tolerate low doses of ACEI or beta blocker due to hypotension/orthostatic symptoms.  At last appointment, I started him back on digoxin at low dose and spironolactone 12.5 mg daily.  He has been taking Lasix every other day.  He continues to have limiting right knee pain.  His CPX was submaximal because of this.  Family says that he "pants" with heavier exertion.  However, he still goes out to do yardwork.  He does get short of breath with yardwork.  He is short of breath after walking 300-400 feet on flat ground or with walking up an incline.  Weight is down 5 lbs.      He was put on  mexiletine by Dr Graciela Husbands for frequent PVCs with the thought that these were contributing to his cardiomyopathy.  However, he stopped it because it made him feel bad.  He does not feel palpitations.    R/LHC 11/02/2012 RA = 5  RV = 41/4/8  PA = 39/11 (27)  PCW = 21 v = 35  Fick cardiac output/index = 3.6/1.7  Thermo CO/CI = 3.2/1.5  PVR = 1.6 [place]  SVR = 2075  FA sat = 96%  PA sat = 57%, 60%  Ao Pressure: 115/69 (88)  LV Pressure: 118/18/22  There was no signficant gradient across the aortic valve on pullback.  Left main: Normal  LAD: Large vessel gives off 1 diagonal. 20-30% proximal lesion. Otherwise normal  LCX: Small ramus. Tiny OM-1. Moderate-sized OM-2. Normal  RCA: Dominant. Normal.  LV-gram done in the RAO projection: Ejection fraction = 15% severe global HK. I do not see clear MR jet  Assessment:  1. Minimal CAD  2. Severe LV dysfunction with low output physiology  3. Significant v-waves in PCWP tracing suggesting of MR   CPX 11/14/2012 Resting HR: 52 Peak HR: 137 (92% age predicted max HR) BP rest: 112/76 BP peak: 140/77 Peak VO2: 24.4 (90.4% predicted peak VO2) VE/VCO2  slope: 29.3 OUES: 2.29 Peak RER: 1.18 Ventilatory Threshold: 18.4 (68.2% predicted peak VO2)  CPX 2/15 Peak VO2 21.7 VE/VCO2 slope 29 RER 1.12 Low normal functional capacity.   Echo (11/16) EF 20-25%, restrictive diastolic function, RV normal size/systolic function, mild MR, PA systolic pressure 47 mmHg.    CPX (11/16) Peak VO2 14.8, VE/VCO2 slope 38.9, RER 0.96.  Submaximal due to knee pain. Moderate restrictive lung pattern, probably at least moderate HF limitation but not conclusive as submaximal.   S/P Medtronic CRT-D 12/14/12    Labs (8/14): K 4.8, creatinine 0.9          (9/14): K 4.2, creatinine 0.91  (12/07/12)  K 4.8 Creatinine 0.9             (03/29/13 ) K 4.6 Creatinine 0.9 Pro BNP 895          (12/15): K 4.5, creatinine 1.0, BNP 895  SH: Lives with his wife in [place].  Nonsmoker.    FH: 6 siblings 3 boys 3 girls         2 brothers: 1 with transplant (had frequent VT), other with HF and ICD         1 sister with CHF and ICD => LMNA cardiomyopathy diagnosed by genetic testing.         1 sister with ovarian cancer         1 sister OK          Father with CHF  ROS: All systems negative except as listed in HPI, PMH and Problem List.  Past Medical History  Diagnosis Date  . Nephrolithiasis   . GERD (gastroesophageal reflux disease)   . Insomnia   . Nonischemic cardiomyopathy (HCC)     Cardiac catheterization 12/2011 with LVEF 45%, no significant obstructive CAD  . Automatic implantable cardioverter-defibrillator in situ   . H/O hiatal hernia   . Osteoarthritis     "knees" (12/14/2012)    Current Outpatient Prescriptions  Medication Sig Dispense Refill  . B Complex Vitamins (B COMPLEX 50 PO) Take 1 tablet by mouth daily.    . Calcium Carbonate-Vitamin D (CALTRATE 600+D PO) Take 1 tablet by mouth daily.    Marland Kitchen Cod Liver Oil 1000 MG CAPS Take 1 capsule by mouth daily.    . digoxin (LANOXIN) 0.125 MG tablet Take 0.5 tablets (0.0625 mg total) by mouth daily. 90 tablet 3  . furosemide (LASIX) 20 MG tablet Take 10 mg by mouth daily.    . Glucosamine Sulfate 1000 MG CAPS Take 1,000 mg by mouth daily.    . Multiple Vitamin (MULTIVITAMIN) tablet Take 1 tablet by mouth daily.    . pantoprazole (PROTONIX) 40 MG tablet Take 40 mg by mouth daily.    . Psyllium (METAMUCIL PO) Take 1 tablet by mouth daily.    Marland Kitchen spironolactone (ALDACTONE) 25 MG tablet Take 12.5 mg by mouth daily.    . vitamin A 8000 UNIT capsule Take 8,000 Units by mouth daily.    . vitamin B-12 (CYANOCOBALAMIN) 1000 MCG tablet Take 1,000 mcg by mouth 2 (two) times daily.    . vitamin C (ASCORBIC ACID) 500 MG tablet Take 500 mg by mouth daily.    Marland Kitchen zolpidem (AMBIEN) 10 MG tablet Take 5 mg by mouth at bedtime as needed for sleep.     No current facility-administered medications for this encounter.    Filed  Vitals:   02/13/15 1029  BP: 106/68  Pulse: 70  Weight: 188 lb 12 oz (  85.616 kg)  SpO2: 98%    Physical Exam: General: Well appearing. No respiratory difficulty; wife and daughter present   HEENT: normal  Neck: supple. no JVD. Carotids 2+ bilat; no bruits. No lymphadenopathy or thryomegaly appreciated.  Cor: PMI laterally displaced. Regular rate & rhythm. No murmur.  No S3/S4.  Lungs: clear  Abdomen: soft, nontender, nondistended. No hepatosplenomegaly. No bruits or masses. Good bowel sounds.  Extremities: no cyanosis, clubbing, rash, edema  Neuro: alert & oriented x 3, cranial nerves grossly intact. moves all 4 extremities w/o difficulty. Affect pleasant.  ASSESSMENT & PLAN:  1) Chronic systolic HF:  NYHA III, nonischemic cardiomyopathy.  Sister with LMNA cardiomyopathy, I suspect this with him as well (think this is familial).  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  EF 20-25% on 11/16 echo.  Has Medtronic CRT-D device.  He does not appear volume overloaded by exam. His functional status continues to deteriorate due to a combination of knee pain and worsening HF. CPX was submaximal due to knee pain but indicates at least moderate HF limitation.   - He has been unable to take even low doses of beta blockers or ACEIs due to dizziness/orthostasis.  - Continue digoxin 0.0625 mg daily.   - Continue spiro - Continue every other day Lasix.  BMET today.   - see below for more in-depth discussion.  2) Familial cardiomyopathy: Sister with LMNA cardiomyopathy diagnosed by genetic testing.  I suspect that he has it also, would fit dilated cardiomyopathy with conduction system disease (LBBB).  I think it would be reasonable to test him for confirmation and if positive, test his children for screening.  We discussed the implications of this again today.  He is still not sure about the testing.  His daughters have scheduled screening echocardiograms. 3) PVCs:  Frequent PVCs noted in the past thought to possibly contribute to cardiomyopathy.  He was not able to tolerate mexiletine.  He has not been able to tolerate beta blockers.  He follows with Dr. Graciela Husbands   We reviewed his CPX tests from 9/14. 2/15 and 11/16. He has had a clear decline in his cardiac function over the past 2 years but I do not think he is at the point that he will require advanced therapies now. But he may in the next year or so. That said, by history and by his last CPX test the major limiting factor currently is his knee pain. Although he would be at high risk for knee replacement I do not think that that risk is currently prohibitive and certainly would be safe/easier now rather than after he had an LVAD. I am going to refer him to Dr. Allie Bossier in orthopedics to get an opinion about whether he would benefit from a knee replacement (or two) and whether he think he can tolerate. We will see him back in [place] and continue to follow his HF closely. No medication titration at this point given previous intolerance. ICD interrogation looks fine.    Arvilla Meres MD 02/13/2015

## 2015-02-13 NOTE — Patient Instructions (Signed)
Follow up 6 weeks.  Do the following things EVERYDAY: 1) Weigh yourself in the morning before breakfast. Write it down and keep it in a log. 2) Take your medicines as prescribed 3) Eat low salt foods-Limit salt (sodium) to 2000 mg per day.  4) Stay as active as you can everyday 5) Limit all fluids for the day to less than 2 liters  

## 2015-02-14 ENCOUNTER — Encounter: Payer: Self-pay | Admitting: Internal Medicine

## 2015-02-14 ENCOUNTER — Ambulatory Visit (INDEPENDENT_AMBULATORY_CARE_PROVIDER_SITE_OTHER): Payer: Medicare Other | Admitting: Internal Medicine

## 2015-02-14 VITALS — BP 112/72 | HR 90 | Ht 74.0 in | Wt 191.2 lb

## 2015-02-14 DIAGNOSIS — I5022 Chronic systolic (congestive) heart failure: Secondary | ICD-10-CM | POA: Diagnosis not present

## 2015-02-14 DIAGNOSIS — I447 Left bundle-branch block, unspecified: Secondary | ICD-10-CM

## 2015-02-14 DIAGNOSIS — I255 Ischemic cardiomyopathy: Secondary | ICD-10-CM | POA: Diagnosis not present

## 2015-02-14 DIAGNOSIS — I429 Cardiomyopathy, unspecified: Secondary | ICD-10-CM | POA: Diagnosis not present

## 2015-02-14 DIAGNOSIS — I493 Ventricular premature depolarization: Secondary | ICD-10-CM | POA: Diagnosis not present

## 2015-02-14 DIAGNOSIS — Z4502 Encounter for adjustment and management of automatic implantable cardiac defibrillator: Secondary | ICD-10-CM

## 2015-02-14 NOTE — Patient Instructions (Signed)
Medication Instructions:  Your physician recommends that you continue on your current medications as directed. Please refer to the Current Medication list given to you today.  Labwork: None ordered  Testing/Procedures: None ordered  Follow-Up: Remote monitoring is used to monitor your Pacemaker of ICD from home. This monitoring reduces the number of office visits required to check your device to one time per year. It allows [place] to keep an eye on the functioning of your device to ensure it is working properly. You are scheduled for a device check from home on 05/16/15. You may send your transmission at any time that day. If you have a wireless device, the transmission will be sent automatically. After your physician reviews your transmission, you will receive a postcard with your next transmission date.  Your physician wants you to follow-up in: 1 year with Dr. Graciela Husbands. You will receive a reminder letter in the mail two months in advance. If you don't receive a letter, please call our office to schedule the follow-up appointment.  If you need a refill on your cardiac medications before your next appointment, please call your pharmacy.  Thank you for choosing [facility]!!    Rad, RN [phone]

## 2015-02-14 NOTE — Progress Notes (Signed)
Patient Care Team: Richardean Chimera, MD as PCP - General (Unknown Physician Specialty)   HPI  [name] is a 74 y.o. male Seen in followup for CRT-D implantation 10/14 for congestive heart failure in the setting of nonischemic probably familial cardiac myopathy with intermittent complete heart block left bundle branch block    he has had frequent PVCs. Attempt to suppress them with mexiletine aborted by the patient because of drug related feel terrible. Biventricular pacing in the past  One year ago was less than 90%.  ECG 10/14> 25%   He does a great deal of work.    he is having increasing problems with shortness of breath. He is not sure of his primary or related to pain in his knee.  Past Medical History  Diagnosis Date  . Nephrolithiasis   . GERD (gastroesophageal reflux disease)   . Insomnia   . Nonischemic cardiomyopathy (HCC)     Cardiac catheterization 12/2011 with LVEF 45%, no significant obstructive CAD  . Automatic implantable cardioverter-defibrillator in situ   . H/O hiatal hernia   . Osteoarthritis     "knees" (12/14/2012)    Past Surgical History  Procedure Laterality Date  . Exploratory laparotomy  2006?  Marland Kitchen Appendectomy    . Volvulus reduction  2007  . Posterior fusion cervical spine  2001  . Bi-ventricular implantable cardioverter defibrillator  (crt-d)  12/14/2012  . Cardiac catheterization  2003?; ~ 11/2012  . Knee arthroscopy Left 1990's  . Lithotripsy      "twice" (12/14/2012)  . Cystoscopy w/ stone manipulation      "3-4 times" (12/14/2012)  . Bi-ventricular implantable cardioverter defibrillator N/A 12/14/2012    Procedure: BI-VENTRICULAR IMPLANTABLE CARDIOVERTER DEFIBRILLATOR  (CRT-D);  Surgeon: Duke Salvia, MD;  Location: [facility] CATH LAB;  Service: Cardiovascular;  Laterality: N/A;    Current Outpatient Prescriptions  Medication Sig Dispense Refill  . B Complex Vitamins (B COMPLEX 50 PO) Take 1 tablet by mouth daily.    . Calcium  Carbonate-Vitamin D (CALTRATE 600+D PO) Take 1 tablet by mouth daily.    Marland Kitchen Cod Liver Oil 1000 MG CAPS Take 1 capsule by mouth daily.    . digoxin (LANOXIN) 0.125 MG tablet Take 0.5 tablets (0.0625 mg total) by mouth daily. 90 tablet 3  . furosemide (LASIX) 20 MG tablet Take 10 mg by mouth daily.    . Glucosamine Sulfate 1000 MG CAPS Take 1,000 mg by mouth daily.    . Multiple Vitamin (MULTIVITAMIN) tablet Take 1 tablet by mouth daily.    . pantoprazole (PROTONIX) 40 MG tablet Take 40 mg by mouth daily.    . Psyllium (METAMUCIL PO) Take 1 tablet by mouth daily.    Marland Kitchen spironolactone (ALDACTONE) 25 MG tablet Take 12.5 mg by mouth daily.    . vitamin A 8000 UNIT capsule Take 8,000 Units by mouth daily.    . vitamin B-12 (CYANOCOBALAMIN) 1000 MCG tablet Take 1,000 mcg by mouth 2 (two) times daily.    . vitamin C (ASCORBIC ACID) 500 MG tablet Take 500 mg by mouth daily.    Marland Kitchen zolpidem (AMBIEN) 10 MG tablet Take 5 mg by mouth at bedtime as needed for sleep.     No current facility-administered medications for this visit.    Allergies  Allergen Reactions  . Phenergan [Promethazine Hcl]   . Morphine And Related Other (See Comments)    Goes crazy     Review of Systems negative except from  HPI and PMH  Physical Exam BP 112/72 mmHg  Pulse 90  Ht 6\' 2"  (1.88 m)  Wt 191 lb 3.2 oz (86.728 kg)  BMI 24.54 kg/m2  SpO2 98% Well developed and well nourished in no acute distress HENT normal E scleral and icterus clear Neck Supple JVP 8-9t; carotids brisk and full Clear to ausculation Device pocket well healed; without hematoma or erythema.  There is no tethering Regular rate and rhythm, no murmurs gallops or rub Soft with active bowel sounds No clubbing cyanosis no  Edema Alert and oriented, grossly normal motor and sensory function Skin Warm and Dry    Assessment and  Plan  Familial cardiomyopathy  Congestive heart failure-chronic systolic  Left bundle branch  block  CRT-D-Medtronic  Symptomatic bradycardia  Ventricular ectopy-frequent  Monitoring of high risk meds   The patient is doing reasonably well. He has noted however that 15+ percent PVCs with a right bundle inferior axis morphology but with RS beats in lead aVL   the PVC burden is increasing. I will discuss with Dr. Dorthea Cove the role of amiodarone. I have reviewed it with the family.    he is scheduled to see orthopedics regarding his knee.

## 2015-02-15 ENCOUNTER — Inpatient Hospital Stay (HOSPITAL_COMMUNITY): Admission: RE | Admit: 2015-02-15 | Payer: Medicare Other | Source: Ambulatory Visit

## 2015-02-19 ENCOUNTER — Other Ambulatory Visit (HOSPITAL_COMMUNITY): Payer: Self-pay | Admitting: Cardiology

## 2015-02-25 LAB — CUP PACEART INCLINIC DEVICE CHECK
Brady Statistic AP VP Percent: 27.74 %
Brady Statistic AP VS Percent: 0.38 %
Brady Statistic AS VP Percent: 62.32 %
Brady Statistic AS VS Percent: 9.55 %
Brady Statistic RV Percent Paced: 85.97 %
Date Time Interrogation Session: 20161208200708
HighPow Impedance: 58 Ohm
Implantable Lead Implant Date: 20141008
Implantable Lead Implant Date: 20141008
Implantable Lead Location: 753858
Implantable Lead Location: 753860
Implantable Lead Model: 4298
Implantable Lead Model: 5076
Lead Channel Impedance Value: 247 Ohm
Lead Channel Impedance Value: 285 Ohm
Lead Channel Impedance Value: 304 Ohm
Lead Channel Impedance Value: 342 Ohm
Lead Channel Impedance Value: 361 Ohm
Lead Channel Impedance Value: 418 Ohm
Lead Channel Impedance Value: 475 Ohm
Lead Channel Impedance Value: 532 Ohm
Lead Channel Pacing Threshold Amplitude: 0.75 V
Lead Channel Pacing Threshold Amplitude: 1 V
Lead Channel Pacing Threshold Pulse Width: 0.4 ms
Lead Channel Sensing Intrinsic Amplitude: 19.375 mV
Lead Channel Sensing Intrinsic Amplitude: 2.375 mV
Lead Channel Setting Pacing Amplitude: 1.5 V
Lead Channel Setting Pacing Amplitude: 2 V
Lead Channel Setting Pacing Pulse Width: 0.4 ms
Lead Channel Setting Sensing Sensitivity: 0.3 mV
MDC IDC LEAD IMPLANT DT: 20141008
MDC IDC LEAD LOCATION: 753859
MDC IDC LEAD MODEL: 6935
MDC IDC MSMT BATTERY REMAINING LONGEVITY: 68 mo
MDC IDC MSMT BATTERY VOLTAGE: 2.97 V
MDC IDC MSMT LEADCHNL LV IMPEDANCE VALUE: 456 Ohm
MDC IDC MSMT LEADCHNL LV IMPEDANCE VALUE: 456 Ohm
MDC IDC MSMT LEADCHNL LV IMPEDANCE VALUE: 513 Ohm
MDC IDC MSMT LEADCHNL LV IMPEDANCE VALUE: 532 Ohm
MDC IDC MSMT LEADCHNL LV PACING THRESHOLD PULSEWIDTH: 0.4 ms
MDC IDC MSMT LEADCHNL RA PACING THRESHOLD AMPLITUDE: 0.75 V
MDC IDC MSMT LEADCHNL RV IMPEDANCE VALUE: 418 Ohm
MDC IDC MSMT LEADCHNL RV PACING THRESHOLD PULSEWIDTH: 0.4 ms
MDC IDC SET LEADCHNL RV PACING AMPLITUDE: 2 V
MDC IDC SET LEADCHNL RV PACING PULSEWIDTH: 0.4 ms
MDC IDC STAT BRADY RA PERCENT PACED: 28.13 %

## 2015-05-07 ENCOUNTER — Encounter (HOSPITAL_COMMUNITY)
Admission: RE | Admit: 2015-05-07 | Discharge: 2015-05-07 | Disposition: A | Discharge status: Home / Self Care | Payer: Medicare Other | Source: Ambulatory Visit | Attending: Ophthalmology | Admitting: Ophthalmology

## 2015-05-07 ENCOUNTER — Encounter (HOSPITAL_COMMUNITY): Payer: Self-pay

## 2015-05-07 DIAGNOSIS — Z01812 Encounter for preprocedural laboratory examination: Secondary | ICD-10-CM | POA: Insufficient documentation

## 2015-05-07 HISTORY — DX: Pneumonia, unspecified organism: J18.9

## 2015-05-07 LAB — CBC
HCT: 42 % (ref 39.0–52.0)
Hemoglobin: 13.4 g/dL (ref 13.0–17.0)
MCH: 28.7 pg (ref 26.0–34.0)
MCHC: 31.9 g/dL (ref 30.0–36.0)
MCV: 89.9 fL (ref 78.0–100.0)
Platelets: 111 10*3/uL — ABNORMAL LOW (ref 150–400)
RBC: 4.67 MIL/uL (ref 4.22–5.81)
RDW: 15.9 % — ABNORMAL HIGH (ref 11.5–15.5)
WBC: 4.9 10*3/uL (ref 4.0–10.5)

## 2015-05-07 LAB — BASIC METABOLIC PANEL
ANION GAP: 6 (ref 5–15)
BUN: 11 mg/dL (ref 6–20)
CHLORIDE: 107 mmol/L (ref 101–111)
CO2: 26 mmol/L (ref 22–32)
Calcium: 8.9 mg/dL (ref 8.9–10.3)
Creatinine, Ser: 1.04 mg/dL (ref 0.61–1.24)
GFR calc non Af Amer: >60 mL/min (ref >60)
Glucose, Bld: 123 mg/dL — ABNORMAL HIGH (ref 65–99)
POTASSIUM: 4.6 mmol/L (ref 3.5–5.1)
Sodium: 139 mmol/L (ref 135–145)

## 2015-05-07 NOTE — Patient Instructions (Signed)
Your procedure is scheduled on:   05/13/2015              Report to [facility] at 7:30    AM.  Call this number if you have problems the morning of surgery: [phone]   Remember:   Do not eat or drink :After Midnight.    Take these medicines the morning of surgery with A SIP OF WATER: Digoxin and pantoprazole           Do not wear jewelry, make-up or nail polish.  Do not wear lotions, powders, or perfumes. You may wear deodorant.  Do not bring valuables to the hospital.  Contacts, dentures or bridgework may not be worn into surgery.  Patients discharged the day of surgery will not be allowed to drive home.  Name and phone number of your driver:    [email or web address] @ Cataract Surgery  A cataract is a clouding of the lens of the eye. When a lens becomes cloudy, vision is reduced based on the degree and nature of the clouding. Surgery may be needed to improve vision. Surgery removes the cloudy lens and usually replaces it with a substitute lens (intraocular lens, IOL). LET YOUR EYE DOCTOR KNOW ABOUT:  Allergies to food or medicine.   Medicines taken including herbs, eyedrops, over-the-counter medicines, and creams.   Use of steroids (by mouth or creams).   Previous problems with anesthetics or numbing medicine.   History of bleeding problems or blood clots.   Previous surgery.   Other health problems, including diabetes and kidney problems.   Possibility of pregnancy, if this applies.  RISKS AND COMPLICATIONS  Infection.   Inflammation of the eyeball (endophthalmitis) that can spread to both eyes (sympathetic ophthalmia).   Poor wound healing.   If an IOL is inserted, it can later fall out of proper position. This is very uncommon.   Clouding of the part of your eye that holds an IOL in place. This is called an "after-cataract." These are uncommon, but easily treated.  BEFORE THE PROCEDURE  Do not eat or drink anything except small amounts of water for 8 to 12 before  your surgery, or as directed by your caregiver.   Unless you are told otherwise, continue any eyedrops you have been prescribed.   Talk to your primary caregiver about all other medicines that you take (both prescription and non-prescription). In some cases, you may need to stop or change medicines near the time of your surgery. This is most important if you are taking blood-thinning medicine.Do not stop medicines unless you are told to do so.   Arrange for someone to drive you to and from the procedure.   Do not put contact lenses in either eye on the day of your surgery.  PROCEDURE There is more than one method for safely removing a cataract. Your doctor can explain the differences and help determine which is best for you. Phacoemulsification surgery is the most common form of cataract surgery.  An injection is given behind the eye or eyedrops are given to make this a painless procedure.   A small cut (incision) is made on the edge of the clear, dome-shaped surface that covers the front of the eye (cornea).   A tiny probe is painlessly inserted into the eye. This device gives off ultrasound waves that soften and break up the cloudy center of the lens. This makes it easier for the cloudy lens to be removed by suction.   An IOL may  be implanted.   The normal lens of the eye is covered by a clear capsule. Part of that capsule is intentionally left in the eye to support the IOL.   Your surgeon may or may not use stitches to close the incision.  There are other forms of cataract surgery that require a larger incision and stiches to close the eye. This approach is taken in cases where the doctor feels that the cataract cannot be easily removed using phacoemulsification. AFTER THE PROCEDURE  When an IOL is implanted, it does not need care. It becomes a permanent part of your eye and cannot be seen or felt.   Your doctor will schedule follow-up exams to check on your progress.   Review  your other medicines with your doctor to see which can be resumed after surgery.   Use eyedrops or take medicine as prescribed by your doctor.  Document Released: 02/12/2011 Document Reviewed: 02/09/2011 [facility] Patient Information 2012 [place].  .Cataract Surgery Care After Refer to this sheet in the next few weeks. These instructions provide you with information on caring for yourself after your procedure. Your caregiver may also give you more specific instructions. Your treatment has been planned according to current medical practices, but problems sometimes occur. Call your caregiver if you have any problems or questions after your procedure.  HOME CARE INSTRUCTIONS   Avoid strenuous activities as directed by your caregiver.   Ask your caregiver when you can resume driving.   Use eyedrops or other medicines to help healing and control pressure inside your eye as directed by your caregiver.   Only take over-the-counter or prescription medicines for pain, discomfort, or fever as directed by your caregiver.   Do not to touch or rub your eyes.   You may be instructed to use a protective shield during the first few days and nights after surgery. If not, wear sunglasses to protect your eyes. This is to protect the eye from pressure or from being accidentally bumped.   Keep the area around your eye clean and dry. Avoid swimming or allowing water to hit you directly in the face while showering. Keep soap and shampoo out of your eyes.   Do not bend or lift heavy objects. Bending increases pressure in the eye. You can walk, climb stairs, and do light household chores.   Do not put a contact lens into the eye that had surgery until your caregiver says it is okay to do so.   Ask your doctor when you can return to work. This will depend on the kind of work that you do. If you work in a dusty environment, you may be advised to wear protective eyewear for a period of time.   Ask your  caregiver when it will be safe to engage in sexual activity.   Continue with your regular eye exams as directed by your caregiver.  What to expect:  It is normal to feel itching and mild discomfort for a few days after cataract surgery. Some fluid discharge is also common, and your eye may be sensitive to light and touch.   After 1 to 2 days, even moderate discomfort should disappear. In most cases, healing will take about 6 weeks.   If you received an intraocular lens (IOL), you may notice that colors are very bright or have a blue tinge. Also, if you have been in bright sunlight, everything may appear reddish for a few hours. If you see these color tinges, it is  because your lens is clear and no longer cloudy. Within a few months after receiving an IOL, these extra colors should go away. When you have healed, you will probably need new glasses.  SEEK MEDICAL CARE IF:   You have increased bruising around your eye.   You have discomfort not helped by medicine.  SEEK IMMEDIATE MEDICAL CARE IF:   You have a fever.   You have a worsening or sudden vision loss.   You have redness, swelling, or increasing pain in the eye.   You have a thick discharge from the eye that had surgery.  MAKE SURE YOU:  Understand these instructions.   Will watch your condition.   Will get help right away if you are not doing well or get worse.  Document Released: 09/12/2004 Document Revised: 02/12/2011 Document Reviewed: 10/17/2010 [facility] Patient Information 2012 [place].    Monitored Anesthesia Care  Monitored anesthesia care is an anesthesia service for a medical procedure. Anesthesia is the loss of the ability to feel pain. It is produced by medications called anesthetics. It may affect a small area of your body (local anesthesia), a large area of your body (regional anesthesia), or your entire body (general anesthesia). The need for monitored anesthesia care depends your procedure, your  condition, and the potential need for regional or general anesthesia. It is often provided during procedures where:   General anesthesia may be needed if there are complications. This is because you need special care when you are under general anesthesia.   You will be under local or regional anesthesia. This is so that you are able to have higher levels of anesthesia if needed.   You will receive calming medications (sedatives). This is especially the case if sedatives are given to put you in a semi-conscious state of relaxation (deep sedation). This is because the amount of sedative needed to produce this state can be hard to predict. Too much of a sedative can produce general anesthesia. Monitored anesthesia care is performed by one or more caregivers who have special training in all types of anesthesia. You will need to meet with these caregivers before your procedure. During this meeting, they will ask you about your medical history. They will also give you instructions to follow. (For example, you will need to stop eating and drinking before your procedure. You may also need to stop or change medications you are taking.) During your procedure, your caregivers will stay with you. They will:   Watch your condition. This includes watching you blood pressure, breathing, and level of pain.   Diagnose and treat problems that occur.   Give medications if they are needed. These may include calming medications (sedatives) and anesthetics.   Make sure you are comfortable.  Having monitored anesthesia care does not necessarily mean that you will be under anesthesia. It does mean that your caregivers will be able to manage anesthesia if you need it or if it occurs. It also means that you will be able to have a different type of anesthesia than you are having if you need it. When your procedure is complete, your caregivers will continue to watch your condition. They will make sure any medications wear  off before you are allowed to go home.  Document Released: 11/19/2004 Document Revised: 06/20/2012 Document Reviewed: 04/06/2012 [facility] Patient Information 2014 [place], [place].

## 2015-05-13 ENCOUNTER — Ambulatory Visit (HOSPITAL_COMMUNITY): Payer: Medicare Other | Admitting: Anesthesiology

## 2015-05-13 ENCOUNTER — Ambulatory Visit (HOSPITAL_COMMUNITY)
Admission: RE | Admit: 2015-05-13 | Discharge: 2015-05-13 | Disposition: A | Discharge status: Home / Self Care | Payer: Medicare Other | Source: Ambulatory Visit | Attending: Ophthalmology | Admitting: Ophthalmology

## 2015-05-13 ENCOUNTER — Encounter (HOSPITAL_COMMUNITY): Payer: Self-pay | Admitting: *Deleted

## 2015-05-13 ENCOUNTER — Encounter (HOSPITAL_COMMUNITY)
Admission: RE | Disposition: A | Discharge status: Home / Self Care | Payer: Self-pay | Source: Ambulatory Visit | Attending: Ophthalmology

## 2015-05-13 ENCOUNTER — Emergency Department (HOSPITAL_COMMUNITY): Payer: Medicare Other

## 2015-05-13 ENCOUNTER — Encounter (HOSPITAL_COMMUNITY): Payer: Self-pay | Admitting: Emergency Medicine

## 2015-05-13 DIAGNOSIS — Z79899 Other long term (current) drug therapy: Secondary | ICD-10-CM | POA: Insufficient documentation

## 2015-05-13 DIAGNOSIS — H269 Unspecified cataract: Secondary | ICD-10-CM | POA: Insufficient documentation

## 2015-05-13 DIAGNOSIS — R0602 Shortness of breath: Secondary | ICD-10-CM | POA: Diagnosis present

## 2015-05-13 DIAGNOSIS — Z95810 Presence of automatic (implantable) cardiac defibrillator: Secondary | ICD-10-CM | POA: Insufficient documentation

## 2015-05-13 DIAGNOSIS — E8770 Fluid overload, unspecified: Secondary | ICD-10-CM | POA: Diagnosis not present

## 2015-05-13 DIAGNOSIS — Z8701 Personal history of pneumonia (recurrent): Secondary | ICD-10-CM | POA: Insufficient documentation

## 2015-05-13 DIAGNOSIS — M1990 Unspecified osteoarthritis, unspecified site: Secondary | ICD-10-CM | POA: Insufficient documentation

## 2015-05-13 DIAGNOSIS — K219 Gastro-esophageal reflux disease without esophagitis: Secondary | ICD-10-CM | POA: Insufficient documentation

## 2015-05-13 DIAGNOSIS — Z87442 Personal history of urinary calculi: Secondary | ICD-10-CM | POA: Diagnosis not present

## 2015-05-13 DIAGNOSIS — I509 Heart failure, unspecified: Secondary | ICD-10-CM | POA: Insufficient documentation

## 2015-05-13 DIAGNOSIS — Z950 Presence of cardiac pacemaker: Secondary | ICD-10-CM | POA: Insufficient documentation

## 2015-05-13 DIAGNOSIS — M1991 Primary osteoarthritis, unspecified site: Secondary | ICD-10-CM | POA: Diagnosis not present

## 2015-05-13 HISTORY — PX: CATARACT EXTRACTION W/PHACO: SHX586

## 2015-05-13 LAB — BASIC METABOLIC PANEL
Anion gap: 12 (ref 5–15)
BUN: 14 mg/dL (ref 6–20)
CALCIUM: 9.3 mg/dL (ref 8.9–10.3)
CO2: 22 mmol/L (ref 22–32)
CREATININE: 1.04 mg/dL (ref 0.61–1.24)
Chloride: 108 mmol/L (ref 101–111)
Glucose, Bld: 145 mg/dL — ABNORMAL HIGH (ref 65–99)
Potassium: 4 mmol/L (ref 3.5–5.1)
SODIUM: 142 mmol/L (ref 135–145)

## 2015-05-13 LAB — I-STAT TROPONIN, ED: TROPONIN I, POC: 0 ng/mL (ref 0.00–0.08)

## 2015-05-13 LAB — CBC
HCT: 40 % (ref 39.0–52.0)
Hemoglobin: 12.7 g/dL — ABNORMAL LOW (ref 13.0–17.0)
MCH: 28.1 pg (ref 26.0–34.0)
MCHC: 31.8 g/dL (ref 30.0–36.0)
MCV: 88.5 fL (ref 78.0–100.0)
PLATELETS: 100 10*3/uL — AB (ref 150–400)
RBC: 4.52 MIL/uL (ref 4.22–5.81)
RDW: 15.7 % — AB (ref 11.5–15.5)
WBC: 6.2 10*3/uL (ref 4.0–10.5)

## 2015-05-13 LAB — BRAIN NATRIURETIC PEPTIDE: B NATRIURETIC PEPTIDE 5: 1235.6 pg/mL — AB (ref 0.0–100.0)

## 2015-05-13 SURGERY — PHACOEMULSIFICATION, CATARACT, WITH IOL INSERTION
Anesthesia: Monitor Anesthesia Care | Laterality: Left

## 2015-05-13 MED ORDER — LACTATED RINGERS IV SOLN: INTRAVENOUS | Status: DC

## 2015-05-13 MED ORDER — NA HYALUR & NA CHOND-NA HYALUR 0.55-0.5 ML IO KIT
PACK | INTRAOCULAR | Status: DC | PRN
  Administered 2015-05-13: 1 via OPHTHALMIC

## 2015-05-13 MED ORDER — LACTATED RINGERS IV SOLN
INTRAVENOUS | Status: DC | PRN
  Administered 2015-05-13: 09:00:00 via INTRAVENOUS

## 2015-05-13 MED ORDER — MIDAZOLAM HCL 2 MG/2ML IJ SOLN
INTRAMUSCULAR | Status: AC
  Filled 2015-05-13: qty 2

## 2015-05-13 MED ORDER — MIDAZOLAM HCL 2 MG/2ML IJ SOLN
1.0000 mg | INTRAMUSCULAR | Status: DC | PRN
  Administered 2015-05-13: 2 mg via INTRAVENOUS

## 2015-05-13 MED ORDER — LIDOCAINE HCL 3.5 % OP GEL
OPHTHALMIC | Status: DC | PRN
  Administered 2015-05-13: 1 via OPHTHALMIC

## 2015-05-13 MED ORDER — CYCLOPENTOLATE-PHENYLEPHRINE 0.2-1 % OP SOLN
1.0000 [drp] | OPHTHALMIC | Status: AC
  Administered 2015-05-13 (×3): 1 [drp] via OPHTHALMIC

## 2015-05-13 MED ORDER — LIDOCAINE HCL 3.5 % OP GEL: 1.0000 "application " | Freq: Once | OPHTHALMIC | Status: DC

## 2015-05-13 MED ORDER — BSS IO SOLN
INTRAOCULAR | Status: DC | PRN
  Administered 2015-05-13: 15 mL via INTRAOCULAR

## 2015-05-13 MED ORDER — TETRACAINE HCL 0.5 % OP SOLN
1.0000 [drp] | OPHTHALMIC | Status: AC
  Administered 2015-05-13 (×3): 1 [drp] via OPHTHALMIC

## 2015-05-13 MED ORDER — PHENYLEPHRINE-KETOROLAC 1-0.3 % IO SOLN
INTRAOCULAR | Status: DC | PRN
  Administered 2015-05-13: 500 mL via OPHTHALMIC

## 2015-05-13 MED ORDER — TETRACAINE 0.5 % OP SOLN OPTIME - NO CHARGE
OPHTHALMIC | Status: DC | PRN
  Administered 2015-05-13: 1 [drp] via OPHTHALMIC

## 2015-05-13 MED ORDER — PHENYLEPHRINE-KETOROLAC 1-0.3 % IO SOLN
INTRAOCULAR | Status: AC
  Filled 2015-05-13: qty 4

## 2015-05-13 MED ORDER — POVIDONE-IODINE 5 % OP SOLN
OPHTHALMIC | Status: DC | PRN
  Administered 2015-05-13: 1 via OPHTHALMIC

## 2015-05-13 SURGICAL SUPPLY — 28 items
CAPSULAR TENSION RING-AMO (OPHTHALMIC RELATED) IMPLANT
CLOTH BEACON ORANGE TIMEOUT ST (SAFETY) ×2 IMPLANT
GLOVE BIO SURGEON STRL SZ7.5 (GLOVE) IMPLANT
GLOVE BIOGEL M 6.5 STRL (GLOVE) IMPLANT
GLOVE BIOGEL PI IND STRL 6.5 (GLOVE) ×1 IMPLANT
GLOVE BIOGEL PI IND STRL 7.0 (GLOVE) IMPLANT
GLOVE BIOGEL PI INDICATOR 6.5 (GLOVE) ×1
GLOVE BIOGEL PI INDICATOR 7.0 (GLOVE)
GLOVE ECLIPSE 6.5 STRL STRAW (GLOVE) IMPLANT
GLOVE ECLIPSE 7.5 STRL STRAW (GLOVE) IMPLANT
GLOVE EXAM NITRILE LRG STRL (GLOVE) IMPLANT
GLOVE EXAM NITRILE MD LF STRL (GLOVE) ×2 IMPLANT
GLOVE SKINSENSE NS SZ6.5 (GLOVE)
GLOVE SKINSENSE NS SZ7.0 (GLOVE)
GLOVE SKINSENSE STRL SZ6.5 (GLOVE) IMPLANT
GLOVE SKINSENSE STRL SZ7.0 (GLOVE) IMPLANT
INST SET CATARACT ~~LOC~~ (KITS) ×2 IMPLANT
KIT VITRECTOMY (OPHTHALMIC RELATED) IMPLANT
LENS ALC ACRYL/TECN (Ophthalmic Related) ×2 IMPLANT
PAD ARMBOARD 7.5X6 YLW CONV (MISCELLANEOUS) ×2 IMPLANT
PROC W NO LENS (INTRAOCULAR LENS)
PROC W SPEC LENS (INTRAOCULAR LENS)
PROCESS W NO LENS (INTRAOCULAR LENS) IMPLANT
PROCESS W SPEC LENS (INTRAOCULAR LENS) IMPLANT
RETRACTOR IRIS SIGHTPATH (OPHTHALMIC RELATED) IMPLANT
RING MALYGIN (MISCELLANEOUS) IMPLANT
VISCOELASTIC ADDITIONAL (OPHTHALMIC RELATED) IMPLANT
WATER STERILE IRR 250ML POUR (IV SOLUTION) ×2 IMPLANT

## 2015-05-13 NOTE — Brief Op Note (Signed)
05/13/2015  10:26 AM  PATIENT:  Joan Flores  75 y.o. male  PRE-OPERATIVE DIAGNOSIS:  Difficulty performing daily activities  POST-OPERATIVE DIAGNOSIS:  * No post-op diagnosis entered *  PROCEDURE:  Procedure(s): CATARACT EXTRACTION PHACO AND INTRAOCULAR LENS PLACEMENT LEFT EYE;  CDE:  3.49  SURGEON:  Surgeon(s): Susa Simmonds, MD  ASSISTANTS: Laurena Bering, CST   ANESTHESIA STAFF: Anesthesiologist: Benita Stabile, MD CRNA: Despina Hidden, CRNA  ANESTHESIA:   topical and MAC  REQUESTED LENS POWER: 19.5  LENS IMPLANT INFORMATION:   Alcon SN60WF +19.5 s/n [id number]  Exp 10/2018  CUMULATIVE DISSIPATED ENERGY:3.49  INDICATIONS:see scanned office H&P for details of indications  OP FINDINGS:dense NS  COMPLICATIONS:None  DICTATION #: none  PLAN OF CARE: as above  PATIENT DISPOSITION:  Short Stay

## 2015-05-13 NOTE — Anesthesia Preprocedure Evaluation (Signed)
Anesthesia Evaluation  Patient identified by MRN, date of birth, ID band Patient awake    Reviewed: Allergy & Precautions, NPO status , Patient's Chart, lab work & pertinent test results  Airway Mallampati: II  TM Distance: >3 FB Neck ROM: Full    Dental  (+) Caps, Missing,    Pulmonary pneumonia, resolved,    Pulmonary exam normal        Cardiovascular +CHF  Normal cardiovascular exam+ pacemaker + Cardiac Defibrillator      Neuro/Psych    GI/Hepatic hiatal hernia, GERD  Medicated and Controlled,  Endo/Other    Renal/GU Renal InsufficiencyRenal disease     Musculoskeletal  (+) Arthritis , Osteoarthritis,    Abdominal Normal abdominal exam  (+) + scaphoid   Peds  Hematology   Anesthesia Other Findings   Reproductive/Obstetrics                             Anesthesia Physical Anesthesia Plan  ASA: IV  Anesthesia Plan: MAC   Post-op Pain Management:    Induction: Intravenous  Airway Management Planned: Nasal Cannula  Additional Equipment:   Intra-op Plan:   Post-operative Plan:   Informed Consent: I have reviewed the patients History and Physical, chart, labs and discussed the procedure including the risks, benefits and alternatives for the proposed anesthesia with the patient or authorized representative who has indicated his/her understanding and acceptance.   Dental advisory given  Plan Discussed with: CRNA  Anesthesia Plan Comments:         Anesthesia Quick Evaluation

## 2015-05-13 NOTE — Anesthesia Postprocedure Evaluation (Signed)
Anesthesia Post Note  Patient: [name]  Procedure(s) Performed: Procedure(s) (LRB): CATARACT EXTRACTION PHACO AND INTRAOCULAR LENS PLACEMENT LEFT EYE;  CDE:  3.49 (Left)  Patient location during evaluation: Short Stay Anesthesia Type: MAC Level of consciousness: awake and alert Pain management: pain level controlled Vital Signs Assessment: post-procedure vital signs reviewed and stable Respiratory status: spontaneous breathing and nonlabored ventilation Cardiovascular status: blood pressure returned to baseline and stable Postop Assessment: no signs of nausea or vomiting Anesthetic complications: no    Last Vitals:  Filed Vitals:   05/13/15 0935 05/13/15 0940  BP: 113/84 111/86  Pulse:    Temp:    Resp: 20 26    Last Pain: There were no vitals filed for this visit.               Itay Mella J

## 2015-05-13 NOTE — Discharge Instructions (Signed)

## 2015-05-13 NOTE — Op Note (Signed)
05/13/2015  10:26 AM  PATIENT:  [name]  75 y.o. male  PRE-OPERATIVE DIAGNOSIS:  Difficulty performing daily activities  POST-OPERATIVE DIAGNOSIS:  * No post-op diagnosis entered *  PROCEDURE:  Procedure(s): CATARACT EXTRACTION PHACO AND INTRAOCULAR LENS PLACEMENT LEFT EYE;  CDE:  3.49  SURGEON:  Surgeon(s): Susa Simmonds, MD  ASSISTANTS: Laurena Bering, CST   ANESTHESIA STAFF: Anesthesiologist: Benita Stabile, MD CRNA: Despina Hidden, CRNA  ANESTHESIA:   topical and MAC  REQUESTED LENS POWER: 19.5  LENS IMPLANT INFORMATION:   Alcon SN60WF +19.5 s/n [id number]  Exp 10/2018  CUMULATIVE DISSIPATED ENERGY:3.49  INDICATIONS:see scanned office H&P for details of indications  OP FINDINGS:dense NS  COMPLICATIONS:None  PROCEDURE:  The patient was brought to the operating room in good condition.  The operative eye was prepped and draped in the usual fashion for intraocular surgery.  Lidocaine gel was dropped onto the eye.  A 2.4 mm 10 O'clock near clear corneal stepped incision and a 12 O'clock stab incision were created.  Viscoat was instilled into the anterior chamber.  The 5 mm anterior capsulorhexis was performed with a bent needle cystotome and Utrata forceps.  The lens was hydrodissected and hydrodelineated with a cannula and balanced salt solution and rotated with a Kuglen hook.  Phacoemulsification was perfomed in the divide and conquer technique.  The remaining cortex was removed with I&A and the capsular surfaces polished as necessary.  Provisc was placed into the capsular bag and the lens inserted with the Alcon inserter.  The viscoelastic was removed with I&A and the lens "rocked" into position.  The wounds were hydrated and te anterior chamber was refilled with balanced salt solution.  The wounds were checked for leakage and rehydrated as necessary.  The lid speculum and drapes were removed and the patient was transported to short stay in good  condition.  PATIENT DISPOSITION:  Short Stay

## 2015-05-13 NOTE — H&P (Signed)
I have reviewed the pre printed H&P, the patient was re-examined, and I have identified no significant interval changes in the patient's medical condition.  There is no change in the plan of care since the history and physical of record. 

## 2015-05-13 NOTE — Transfer of Care (Signed)
Immediate Anesthesia Transfer of Care Note  Patient: [name]  Procedure(s) Performed: Procedure(s): CATARACT EXTRACTION PHACO AND INTRAOCULAR LENS PLACEMENT LEFT EYE;  CDE:  3.49 (Left)  Patient Location: Short Stay  Anesthesia Type:MAC  Level of Consciousness: awake and patient cooperative  Airway & Oxygen Therapy: Patient Spontanous Breathing and Patient connected to nasal cannula oxygen  Post-op Assessment: Report given to RN, Post -op Vital signs reviewed and stable and Patient moving all extremities  Post vital signs: Reviewed and stable  Last Vitals:  Filed Vitals:   05/13/15 0935 05/13/15 0940  BP: 113/84 111/86  Pulse:    Temp:    Resp: 20 26    Complications: No apparent anesthesia complications

## 2015-05-13 NOTE — ED Provider Notes (Signed)
EKG shows paced rhythm, slightly more ST elevation in III, ST depression in V1, V2.  Discussed with Dr. Allyson Sabal who advises no STEMI activation  Rolan Bucco, MD 05/13/15 2255

## 2015-05-13 NOTE — ED Notes (Signed)
Pt. reports SOB with dry cough and fatigue onset this evening , pt. states history of chronic systolic heart failure his cardiologist is Dr. Clarise Cruz .

## 2015-05-14 ENCOUNTER — Emergency Department (HOSPITAL_COMMUNITY)
Admission: EM | Admit: 2015-05-14 | Discharge: 2015-05-14 | Disposition: A | Discharge status: Home / Self Care | Payer: Medicare Other | Attending: Emergency Medicine | Admitting: Emergency Medicine

## 2015-05-14 ENCOUNTER — Encounter (HOSPITAL_COMMUNITY): Payer: Self-pay | Admitting: Ophthalmology

## 2015-05-14 DIAGNOSIS — E8770 Fluid overload, unspecified: Secondary | ICD-10-CM | POA: Diagnosis not present

## 2015-05-14 MED ORDER — FUROSEMIDE 10 MG/ML IJ SOLN
40.0000 mg | Freq: Once | INTRAMUSCULAR | Status: AC
  Administered 2015-05-14: 40 mg via INTRAVENOUS
  Filled 2015-05-14: qty 4

## 2015-05-14 MED ORDER — FUROSEMIDE 10 MG/ML IJ SOLN
80.0000 mg | Freq: Once | INTRAMUSCULAR | Status: DC
  Filled 2015-05-14: qty 8

## 2015-05-14 NOTE — ED Provider Notes (Signed)
CSN: [id number]     Arrival date & time 05/13/15  2220 History  By signing my name below, I, Ronney Lion, attest that this documentation has been prepared under the direction and in the presence of Tomasita Crumble, MD. Electronically Signed: Ronney Lion, ED Scribe. 05/14/2015. 4:13 AM.   Chief Complaint  Patient presents with  . Shortness of Breath   The history is provided by the patient. No language interpreter was used.    HPI Comments: [name] is a 75 y.o. male with a history of nonischemic cardiomyopathy, CHF, and defibrillator placement, who presents to the Emergency Department complaining of constant, moderate, gradually worsening SOB that began 1 week ago. He notes associated chills. He states he has been taking spironolactone but has had increased leg swelling, with a fluid weight gain of 7 pounds recently. Patient states he recently endured a rib fracture after having pneumonia and influenza.   Cardiologist: Dr. Gala Romney  Past Medical History  Diagnosis Date  . Nephrolithiasis   . GERD (gastroesophageal reflux disease)   . Insomnia   . Nonischemic cardiomyopathy (HCC)     Cardiac catheterization 12/2011 with LVEF 45%, no significant obstructive CAD  . Automatic implantable cardioverter-defibrillator in situ   . H/O hiatal hernia   . Osteoarthritis     "knees" (12/14/2012)  . Pneumonia     Dec 2016  . CHF (congestive heart failure) [facility])    Past Surgical History  Procedure Laterality Date  . Exploratory laparotomy  2006?  Marland Kitchen Appendectomy    . Volvulus reduction  2007  . Posterior fusion cervical spine  2001  . Bi-ventricular implantable cardioverter defibrillator  (crt-d)  12/14/2012  . Cardiac catheterization  2003?; ~ 11/2012  . Knee arthroscopy Left 1990's  . Lithotripsy      "twice" (12/14/2012)  . Cystoscopy w/ stone manipulation      "3-4 times" (12/14/2012)  . Bi-ventricular implantable cardioverter defibrillator N/A 12/14/2012    Procedure: BI-VENTRICULAR  IMPLANTABLE CARDIOVERTER DEFIBRILLATOR  (CRT-D);  Surgeon: Duke Salvia, MD;  Location: [facility] CATH LAB;  Service: Cardiovascular;  Laterality: N/A;   Family History  Problem Relation Age of Onset  . Cancer - Prostate Maternal Uncle   . Heart failure Mother   . Stroke Mother   . Asthma Mother   . Heart disease Father   . Heart failure Father   . Congestive Heart Failure Brother     @ 62 Heart transplant, [place]  . Congestive Heart Failure Brother     ICD  . Heart failure Sister     ICD  . Cancer - Ovarian Sister     deceased @ 42   Social History  Substance Use Topics  . Smoking status: Never Smoker   . Smokeless tobacco: Current User    Types: Chew  . Alcohol Use: No    Review of Systems A complete 10 system review of systems was obtained and all systems are negative except as noted in the HPI and PMH.    Allergies  Phenergan and Morphine and related  Home Medications   Prior to Admission medications   Medication Sig Start Date End Date Taking? Authorizing Provider  B Complex Vitamins (B COMPLEX 50 PO) Take 1 tablet by mouth daily.    Historical Provider, MD  Calcium Carbonate-Vitamin D (CALTRATE 600+D PO) Take 1 tablet by mouth daily.    Historical Provider, MD  Carboxymethylcellulose Sodium (THERATEARS OP) Apply 1 drop to eye 3 (three) times daily.  Historical Provider, MD  [facility] Liver Oil 1000 MG CAPS Take 1 capsule by mouth daily.    Historical Provider, MD  digoxin (LANOXIN) 0.125 MG tablet Take 0.5 tablets (0.0625 mg total) by mouth daily. 01/25/15   Laurey Morale, MD  furosemide (LASIX) 20 MG tablet Take 10 mg by mouth daily.    Historical Provider, MD  Glucosamine Sulfate 1000 MG CAPS Take 1,000 mg by mouth daily.    Historical Provider, MD  Multiple Vitamin (MULTIVITAMIN) tablet Take 1 tablet by mouth daily.    Historical Provider, MD  pantoprazole (PROTONIX) 40 MG tablet Take 40 mg by mouth daily.    Historical Provider, MD  Psyllium (METAMUCIL PO) Take 1 tablet  by mouth daily.    Historical Provider, MD  spironolactone (ALDACTONE) 25 MG tablet Take 12.5 mg by mouth daily.    Historical Provider, MD  VENTOLIN HFA 108 (90 Base) MCG/ACT inhaler Inhale 1 puff into the lungs daily as needed. 04/15/15   Historical Provider, MD  vitamin A 8000 UNIT capsule Take 8,000 Units by mouth every other day.     Historical Provider, MD  vitamin B-12 (CYANOCOBALAMIN) 1000 MCG tablet Take 1,000 mcg by mouth daily.     Historical Provider, MD  vitamin C (ASCORBIC ACID) 500 MG tablet Take 500 mg by mouth every other day.     Historical Provider, MD   BP 100/78 mmHg  Pulse 74  Temp(Src) 97.9 F (36.6 C) (Oral)  Resp 21  Ht 6\' 1"  (1.854 m)  Wt 184 lb (83.462 kg)  BMI 24.28 kg/m2  SpO2 94% Physical Exam  Constitutional: He is oriented to person, place, and time. Vital signs are normal. He appears well-developed and well-nourished.  Non-toxic appearance. He does not appear ill. No distress.  HENT:  Head: Normocephalic and atraumatic.  Nose: Nose normal.  Mouth/Throat: Oropharynx is clear and moist. No oropharyngeal exudate.  Eyes: Conjunctivae and EOM are normal. Pupils are equal, round, and reactive to light. No scleral icterus.  Neck: Normal range of motion. Neck supple. No tracheal deviation, no edema, no erythema and normal range of motion present. No thyroid mass and no thyromegaly present.  Cardiovascular: Normal rate, regular rhythm, S1 normal, S2 normal, normal heart sounds, intact distal pulses and normal pulses.  Exam reveals no gallop and no friction rub.   No murmur heard. Pulmonary/Chest: Effort normal and breath sounds normal. No respiratory distress. He has no wheezes. He has no rhonchi. He has no rales.  Abdominal: Soft. Normal appearance and bowel sounds are normal. He exhibits no distension, no ascites and no mass. There is no hepatosplenomegaly. There is no tenderness. There is no rebound, no guarding and no CVA tenderness.  Musculoskeletal: Normal  range of motion. He exhibits edema. He exhibits no tenderness.  2+ BLE edema.   Lymphadenopathy:    He has no cervical adenopathy.  Neurological: He is alert and oriented to person, place, and time. He has normal strength. No cranial nerve deficit or sensory deficit.  Skin: Skin is warm, dry and intact. No petechiae and no rash noted. He is not diaphoretic. No erythema. No pallor.  Psychiatric: He has a normal mood and affect. His behavior is normal. Judgment normal.  Nursing note and vitals reviewed.   ED Course  Procedures (including critical care time)  DIAGNOSTIC STUDIES: Oxygen Saturation is 94% on RA, adequate by my interpretation.    COORDINATION OF CARE: 3:09 AM - Discussed treatment plan with pt at bedside. Pt verbalized  understanding and agreed to plan.   Labs Review Labs Reviewed  BASIC METABOLIC PANEL - Abnormal; Notable for the following:    Glucose, Bld 145 (*)    All other components within normal limits  CBC - Abnormal; Notable for the following:    Hemoglobin 12.7 (*)    RDW 15.7 (*)    Platelets 100 (*)    All other components within normal limits  BRAIN NATRIURETIC PEPTIDE - Abnormal; Notable for the following:    B Natriuretic Peptide 1235.6 (*)    All other components within normal limits  I-STAT TROPOININ, ED    Imaging Review Dg Chest 2 View  05/13/2015  CLINICAL DATA:  Shortness of breath, chest pain, and weakness tonight. History of heart failure. EXAM: CHEST  2 VIEW COMPARISON:  12/15/2012 FINDINGS: Cardiac pacemaker. Borderline heart size with normal pulmonary vascularity. Small bilateral pleural effusions. Atelectasis in both lung bases. No pneumothorax. Mediastinal contours appear intact. Calcification of the aorta. Degenerative changes in the shoulders. IMPRESSION: Small bilateral pleural effusions with basilar atelectasis. Electronically Signed   By: Burman Nieves M.D.   On: 05/13/2015 23:20   I have personally reviewed and evaluated these  images and lab results as part of my medical decision-making.   EKG Interpretation   Date/Time:  Monday May 13 2015 22:27:55 EST Ventricular Rate:  84 PR Interval:  176 QRS Duration: 152 QT Interval:  422 QTC Calculation: 498 R Axis:   -74 Text Interpretation:  Normal sinus rhythm Left axis deviation Non-specific  intra-ventricular conduction block Inferior infarct , age undetermined  Anterolateral infarct , age undetermined Abnormal ECG paced rhythm  Confirmed by BELFI  MD, MELANIE ([id number]) on 05/13/2015 10:32:42 PM      MDM   Final diagnoses:  None   Patient presents to the ED for fluid overload and weight gain.  BNP is elevated.  He was given lasix  as per Dr. Antoine Poche recommendation.  He diuresed 1600 cc of urine.  He was given a meal in the ED as well to prevent any hypotension.  Advised to see Dr. Gala Romney today or tomorrow for close follow up and management of his medications.  He appears well and in NAD.  VS remain within his normal limits and he is safe for DC.   I personally performed the services described in this documentation, which was scribed in my presence. The recorded information has been reviewed and is accurate.      Tomasita Crumble, MD 05/14/15 [phone]

## 2015-05-14 NOTE — Discharge Instructions (Signed)
Heart Failure Mr. Edgar Finland, do NOT take your morning spironolactone and call your cardiologist for an appointment to be seen within 2 days.  Continue your normal medications the following day.  If any symptoms worsen, come back to the ED immediately. Thank you. Heart failure means your heart has trouble pumping blood. This makes it hard for your body to work well. Heart failure is usually a long-term (chronic) condition. You must take good care of yourself and follow your doctor's treatment plan. HOME CARE  Take your heart medicine as told by your doctor.  Do not stop taking medicine unless your doctor tells you to.  Do not skip any dose of medicine.  Refill your medicines before they run out.  Take other medicines only as told by your doctor or pharmacist.  Stay active if told by your doctor. The elderly and people with severe heart failure should talk with a doctor about physical activity.  Eat heart-healthy foods. Choose foods that are without trans fat and are low in saturated fat, cholesterol, and salt (sodium). This includes fresh or frozen fruits and vegetables, fish, lean meats, fat-free or low-fat dairy foods, whole grains, and high-fiber foods. Lentils and dried peas and beans (legumes) are also good choices.  Limit salt if told by your doctor.  Cook in a healthy way. Roast, grill, broil, bake, poach, steam, or stir-fry foods.  Limit fluids as told by your doctor.  Weigh yourself every morning. Do this after you pee (urinate) and before you eat breakfast. Write down your weight to give to your doctor.  Take your blood pressure and write it down if your doctor tells you to.  Ask your doctor how to check your pulse. Check your pulse as told.  Lose weight if told by your doctor.  Stop smoking or chewing tobacco. Do not use gum or patches that help you quit without your doctor's approval.  Schedule and go to doctor visits as told.  Nonpregnant women should have no more  than 1 drink a day. Men should have no more than 2 drinks a day. Talk to your doctor about drinking alcohol.  Stop illegal drug use.  Stay current with shots (immunizations).  Manage your health conditions as told by your doctor.  Learn to manage your stress.  Rest when you are tired.  If it is really hot outside:  Avoid intense activities.  Use air conditioning or fans, or get in a cooler place.  Avoid caffeine and alcohol.  Wear loose-fitting, lightweight, and light-colored clothing.  If it is really cold outside:  Avoid intense activities.  Layer your clothing.  Wear mittens or gloves, a hat, and a scarf when going outside.  Avoid alcohol.  Learn about heart failure and get support as needed.  Get help to maintain or improve your quality of life and your ability to care for yourself as needed. GET HELP IF:   You gain weight quickly.  You are more short of breath than usual.  You cannot do your normal activities.  You tire easily.  You cough more than normal, especially with activity.  You have any or more puffiness (swelling) in areas such as your hands, feet, ankles, or belly (abdomen).  You cannot sleep because it is hard to breathe.  You feel like your heart is beating fast (palpitations).  You get dizzy or light-headed when you stand up. GET HELP RIGHT AWAY IF:   You have trouble breathing.  There is a change in mental status,  such as becoming less alert or not being able to focus.  You have chest pain or discomfort.  You faint. MAKE SURE YOU:   Understand these instructions.  Will watch your condition.  Will get help right away if you are not doing well or get worse.   This information is not intended to replace advice given to you by your health care provider. Make sure you discuss any questions you have with your health care provider.   Document Released: 12/03/2007 Document Revised: 03/16/2014 Document Reviewed: 04/11/2012 Elsevier  Interactive Patient Education [employer]

## 2015-05-16 ENCOUNTER — Ambulatory Visit (HOSPITAL_COMMUNITY)
Admission: RE | Admit: 2015-05-16 | Discharge: 2015-05-16 | Disposition: A | Discharge status: Home / Self Care | Payer: Medicare Other | Source: Ambulatory Visit | Attending: Internal Medicine | Admitting: Internal Medicine

## 2015-05-16 ENCOUNTER — Encounter (HOSPITAL_COMMUNITY): Payer: Self-pay | Admitting: Internal Medicine

## 2015-05-16 ENCOUNTER — Encounter: Payer: Medicare Other | Admitting: *Deleted

## 2015-05-16 VITALS — BP 110/70 | HR 81 | Wt 184.2 lb

## 2015-05-16 DIAGNOSIS — I428 Other cardiomyopathies: Secondary | ICD-10-CM | POA: Diagnosis not present

## 2015-05-16 DIAGNOSIS — Z95810 Presence of automatic (implantable) cardiac defibrillator: Secondary | ICD-10-CM | POA: Insufficient documentation

## 2015-05-16 DIAGNOSIS — I5022 Chronic systolic (congestive) heart failure: Secondary | ICD-10-CM

## 2015-05-16 DIAGNOSIS — I493 Ventricular premature depolarization: Secondary | ICD-10-CM | POA: Diagnosis not present

## 2015-05-16 DIAGNOSIS — Z79899 Other long term (current) drug therapy: Secondary | ICD-10-CM | POA: Insufficient documentation

## 2015-05-16 DIAGNOSIS — Z8249 Family history of ischemic heart disease and other diseases of the circulatory system: Secondary | ICD-10-CM | POA: Insufficient documentation

## 2015-05-16 DIAGNOSIS — I447 Left bundle-branch block, unspecified: Secondary | ICD-10-CM | POA: Diagnosis not present

## 2015-05-16 DIAGNOSIS — K219 Gastro-esophageal reflux disease without esophagitis: Secondary | ICD-10-CM | POA: Diagnosis not present

## 2015-05-16 MED ORDER — POTASSIUM CHLORIDE CRYS ER 20 MEQ PO TBCR: 20.0000 meq | EXTENDED_RELEASE_TABLET | ORAL | Status: DC

## 2015-05-16 MED ORDER — FUROSEMIDE 20 MG PO TABS: 20.0000 mg | ORAL_TABLET | ORAL | Status: DC

## 2015-05-16 MED ORDER — FUROSEMIDE 40 MG PO TABS: 20.0000 mg | ORAL_TABLET | ORAL | Status: DC

## 2015-05-16 NOTE — Progress Notes (Signed)
Patient ID: [name], male   DOB: 09-15-1940, 75 y.o.   MRN: [id number]  ADVANCED HF CLINIC NOTE  Patient ID: [name], male   DOB: 1940/07/24, 75 y.o.   MRN: [id number] Referring Physician: Dr. Diona Browner  Primary Care: Dr. Donzetta Sprung [facility])   HPI:  [name] is a pleasant 75 yo male with a h/o familial cardiomyopathy and LBBB who was referred to the clinic by Dr. Diona Browner for a decrease in his EF.  In 2003 he had an echo with EF 45%. ECHO in 2007 showed EF 50-55%.   6 siblings 3 boys, 3 girls  2 brothers: 1 with transplant (had frequent VT), other with HF and ICD  1 sister with CHF and ICD.  Sister found to have LMNA mutation.  1 sister with ovarian cancer  1 sister OK  Father with CHF  Was doing well until July 2014 when he was outside and bent over to turn off spigot and got very dizzy and he fell over and caught himself before he hit his face on a rock. He denied LOC. Family said he was confused for a bit. No palpitations , CP or seizure activity. No further episodes. Had a repeat ECHO in 7/14 showed EF 25%, diffuse HK, mild MR, and mild TR. He has a family history of NICM (mother and brother) and his brother is s/p cardiac transplantation. He had a 24 holter monitor placed showing SR 41-112 with average 64 bpm, frequent PVCs with some NSVT. About 8% beats PVCs, also had some pauses ranging 2.0-2.8 seconds. He was then referred to HF clinic.  He has not been able to tolerate low doses of ACEI or beta blocker due to hypotension/orthostatic symptoms.  He was put on mexiletine by Dr Graciela Husbands for frequent PVCs with the thought that these were contributing to his cardiomyopathy.  However, he stopped it because it made him feel bad.  He does not feel palpitations.   He presents for an unscheduled visit due to SOB.  Seen in ER a few days ago with severe SOB. BNP was up. CXR with bilateral effusions.  Got 40 IV lasix and 1600cc urine out. Felt some better but still SOB. Weighs  everyday. Doesn't take lasix. Says BP is too low for lasix. Recently saw Dr. Magnus Ivan for knee pain and much improved after injection.   Studies:  [name] 11/02/2012 RA = 5  RV = 41/4/8  PA = 39/11 (27)  PCW = 21 v = 35  Fick cardiac output/index = 3.6/1.7  Thermo CO/CI = 3.2/1.5  PVR = 1.6 [place]  SVR = 2075  FA sat = 96%  PA sat = 57%, 60%  Ao Pressure: 115/69 (88)  LV Pressure: 118/18/22  There was no signficant gradient across the aortic valve on pullback.  Left main: Normal  LAD: Large vessel gives off 1 diagonal. 20-30% proximal lesion. Otherwise normal  LCX: Small ramus. Tiny OM-1. Moderate-sized OM-2. Normal  RCA: Dominant. Normal.  LV-gram done in the RAO projection: Ejection fraction = 15% severe global HK. I do not see clear MR jet  Assessment:  1. Minimal CAD  2. Severe LV dysfunction with low output physiology  3. Significant v-waves in PCWP tracing suggesting of MR   CPX 11/14/2012 Resting HR: 52 Peak HR: 137 (92% age predicted max HR) BP rest: 112/76 BP peak: 140/77 Peak VO2: 24.4 (90.4% predicted peak VO2) VE/VCO2 slope: 29.3 OUES: 2.29 Peak RER: 1.18 Ventilatory Threshold: 18.4 (68.2% predicted peak VO2)  CPX 2/15 Peak VO2 21.7 VE/VCO2 slope 29 RER 1.12 Low normal functional capacity.   Echo (11/16) EF 20-25%, restrictive diastolic function, RV normal size/systolic function, mild MR, PA systolic pressure 47 mmHg.    CPX (11/16) Peak VO2 14.8, VE/VCO2 slope 38.9, RER 0.96.  Submaximal due to knee pain. Moderate restrictive lung pattern, probably at least moderate HF limitation but not conclusive as submaximal.   S/P Medtronic CRT-D 12/14/12    Labs (8/14): K 4.8, creatinine 0.9          (9/14): K 4.2, creatinine 0.91  (12/07/12)  K 4.8 Creatinine 0.9             (03/29/13 ) K 4.6 Creatinine 0.9 Pro BNP 895          (12/15): K 4.5, creatinine 1.0, BNP 895  SH: Lives with his wife in [place].  Nonsmoker.   FH: 6 siblings 3 boys 3 girls         2  brothers: 1 with transplant (had frequent VT), other with HF and ICD         1 sister with CHF and ICD => LMNA cardiomyopathy diagnosed by genetic testing.         1 sister with ovarian cancer         1 sister OK          Father with CHF  ROS: All systems negative except as listed in HPI, PMH and Problem List.  Past Medical History  Diagnosis Date  . Nephrolithiasis   . GERD (gastroesophageal reflux disease)   . Insomnia   . Nonischemic cardiomyopathy (HCC)     Cardiac catheterization 12/2011 with LVEF 45%, no significant obstructive CAD  . Automatic implantable cardioverter-defibrillator in situ   . H/O hiatal hernia   . Osteoarthritis     "knees" (12/14/2012)  . Pneumonia     Dec 2016  . CHF (congestive heart failure) (HCC)     Current Outpatient Prescriptions  Medication Sig Dispense Refill  . B Complex Vitamins (B COMPLEX 50 PO) Take 1 tablet by mouth daily.    . Calcium Carbonate-Vitamin D (CALTRATE 600+D PO) Take 1 tablet by mouth daily.    . Carboxymethylcellulose Sodium (THERATEARS OP) Apply 1 drop to eye 3 (three) times daily.    Marland Kitchen Cod Liver Oil 1000 MG CAPS Take 1 capsule by mouth daily.    . digoxin (LANOXIN) 0.125 MG tablet Take 0.5 tablets (0.0625 mg total) by mouth daily. 90 tablet 3  . gatifloxacin (ZYMAXID) 0.5 % SOLN Place 1 drop into the left eye 4 (four) times daily.    . Glucosamine Sulfate 1000 MG CAPS Take 1,000 mg by mouth daily.    Marland Kitchen ketorolac (ACULAR) 0.5 % ophthalmic solution Place 1 drop into the left eye 4 (four) times daily.    . Multiple Vitamin (MULTIVITAMIN) tablet Take 1 tablet by mouth daily.    . pantoprazole (PROTONIX) 40 MG tablet Take 40 mg by mouth daily.    . prednisoLONE acetate (PRED FORTE) 1 % ophthalmic suspension Place 1 drop into the left eye 4 (four) times daily.    . Psyllium (METAMUCIL PO) Take 1 tablet by mouth daily.    Marland Kitchen spironolactone (ALDACTONE) 25 MG tablet Take 12.5 mg by mouth daily.    . vitamin B-12 (CYANOCOBALAMIN) 1000  MCG tablet Take 1,000 mcg by mouth daily.     . vitamin C (ASCORBIC ACID) 500 MG tablet Take 500 mg by mouth every other  day.     . vitamin A 8000 UNIT capsule Take 8,000 Units by mouth 3 (three) times a week.      No current facility-administered medications for this encounter.    Filed Vitals:   05/16/15 1528  BP: 110/70  Pulse: 81  Weight: 184 lb 4 oz (83.575 kg)  SpO2: 98%    Physical Exam: General: Well appearing. No respiratory difficulty; wife and daughter present   HEENT: normal  Neck: supple. JVP 9. Carotids 2+ bilat; no bruits. No lymphadenopathy or thryomegaly appreciated.  Cor: PMI laterally displaced. Regular rate & rhythm. No murmur.  No S3/S4.  Lungs: clear  Abdomen: soft, nontender, nondistended. No hepatosplenomegaly. No bruits or masses. Good bowel sounds.  Extremities: no cyanosis, clubbing, rash, 1+ edema  Neuro: alert & oriented x 3, cranial nerves grossly intact. moves all 4 extremities w/o difficulty. Affect pleasant.  ASSESSMENT & PLAN:  1) Chronic systolic HF:  NYHA III, nonischemic cardiomyopathy.  Sister with LMNA cardiomyopathy, I suspect this with him as well (think this is familial).  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  EF 20-25% on 11/16 echo.  Has Medtronic CRT-D device. CPX was submaximal due to knee pain but indicates at least moderate HF limitation.   - He is volume overloaded today. Has had a hard time tolerating diuresis. Will start lasix 20 M/W/F + KCl 20. Repeat echo  - He has been unable to take even low doses of beta blockers or ACEIs due to dizziness/orthostasis.  - Continue digoxin 0.0625 mg daily.   - Continue spiro - Continue to follow closely for need for advanced therapies. Recent CPX limited by knee pain which is improved after injection. 2) Familial cardiomyopathy: Sister with LMNA cardiomyopathy diagnosed by genetic testing.  I suspect that he has it also, would fit dilated cardiomyopathy  with conduction system disease (LBBB).  I think it would be reasonable to test him for confirmation and if positive, test his children for screening.  We discussed the implications of this again today.  He is still not sure about the testing.  His daughters have scheduled screening echocardiograms. 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  He was not able to tolerate mexiletine.  He has not been able to tolerate beta blockers.  He follows with Dr. [place] Bing, Reuel Boom MD 05/16/2015

## 2015-05-16 NOTE — Patient Instructions (Signed)
START Lasix 20mg  tablet once daily on Monday, Wednesday, and Fridays.  START Potassium 20 meq tablet once daily on Monday, Wednesday, and Fridays.  Follow up 1 week.  Do the following things EVERYDAY: 1) Weigh yourself in the morning before breakfast. Write it down and keep it in a log. 2) Take your medicines as prescribed 3) Eat low salt foods-Limit salt (sodium) to 2000 mg per day.  4) Stay as active as you can everyday 5) Limit all fluids for the day to less than 2 liters

## 2015-05-17 ENCOUNTER — Encounter: Payer: Self-pay | Admitting: Cardiology

## 2015-05-23 ENCOUNTER — Encounter: Payer: Self-pay | Admitting: Internal Medicine

## 2015-05-23 ENCOUNTER — Encounter (HOSPITAL_COMMUNITY): Payer: Self-pay | Admitting: *Deleted

## 2015-05-23 ENCOUNTER — Ambulatory Visit (HOSPITAL_COMMUNITY)
Admission: RE | Admit: 2015-05-23 | Discharge: 2015-05-23 | Disposition: A | Discharge status: Home / Self Care | Payer: Medicare Other | Source: Ambulatory Visit | Attending: Internal Medicine | Admitting: Internal Medicine

## 2015-05-23 VITALS — BP 106/72 | HR 95 | Wt 178.0 lb

## 2015-05-23 DIAGNOSIS — K219 Gastro-esophageal reflux disease without esophagitis: Secondary | ICD-10-CM | POA: Diagnosis not present

## 2015-05-23 DIAGNOSIS — I428 Other cardiomyopathies: Secondary | ICD-10-CM | POA: Diagnosis not present

## 2015-05-23 DIAGNOSIS — I493 Ventricular premature depolarization: Secondary | ICD-10-CM

## 2015-05-23 DIAGNOSIS — Z95810 Presence of automatic (implantable) cardiac defibrillator: Secondary | ICD-10-CM | POA: Insufficient documentation

## 2015-05-23 DIAGNOSIS — I5022 Chronic systolic (congestive) heart failure: Secondary | ICD-10-CM | POA: Diagnosis present

## 2015-05-23 DIAGNOSIS — I472 Ventricular tachycardia: Secondary | ICD-10-CM | POA: Insufficient documentation

## 2015-05-23 DIAGNOSIS — Z79899 Other long term (current) drug therapy: Secondary | ICD-10-CM | POA: Insufficient documentation

## 2015-05-23 DIAGNOSIS — I429 Cardiomyopathy, unspecified: Secondary | ICD-10-CM

## 2015-05-23 DIAGNOSIS — Z8249 Family history of ischemic heart disease and other diseases of the circulatory system: Secondary | ICD-10-CM | POA: Insufficient documentation

## 2015-05-23 MED ORDER — FUROSEMIDE 40 MG PO TABS: 20.0000 mg | ORAL_TABLET | Freq: Every day | ORAL | Status: DC

## 2015-05-23 NOTE — Patient Instructions (Signed)
Your physician has requested that you have an echocardiogram. Echocardiography is a painless test that uses sound waves to create images of your heart. It provides your doctor with information about the size and shape of your heart and how well your heart's chambers and valves are working. This procedure takes approximately one hour. There are no restrictions for this procedure.  Your physician has recommended that you have a cardiopulmonary stress test (CPX). CPX testing is a non-invasive measurement of heart and lung function. It replaces a traditional treadmill stress test. This type of test provides a tremendous amount of information that relates not only to your present condition but also for future outcomes. This test combines measurements of you ventilation, respiratory gas exchange in the lungs, electrocardiogram (EKG), blood pressure and physical response before, during, and following an exercise protocol.  TAKE LASIX 20 MG DAILY  Your physician recommends that you schedule a follow-up appointment in: 2 WEEKS WITH DR Gala Romney

## 2015-05-23 NOTE — Progress Notes (Signed)
Patient ID: EMERY DUPUY, male   DOB: Aug 03, 1940, 75 y.o.   MRN: [id number] Patient ID: GRIFFYN KUCINSKI, male   DOB: [birth year], 75 y.o.   MRN: [id number]  ADVANCED HF CLINIC NOTE  Patient ID: ETHANJAMES FONTENOT, male   DOB: 05-30-1940, 75 y.o.   MRN: [id number] Referring Physician: Dr. Domenic Polite  Primary Care: Dr. Gar Ponto [facility])   HPI:  Mr. Staup is a pleasant 75 yo male with a h/o familial cardiomyopathy and LBBB who was referred to the clinic by Dr. Domenic Polite for a decrease in his EF.  In 2003 he had an echo with EF 45%. ECHO in 2007 showed EF 50-55%.   6 siblings 3 boys, 3 girls  2 brothers: 1 with transplant (had frequent VT), other with HF and ICD  1 sister with CHF and ICD.  Sister found to have LMNA mutation.  1 sister with ovarian cancer  1 sister OK  Father with CHF  Was doing well until July 2014 when he was outside and bent over to turn off spigot and got very dizzy and he fell over and caught himself before he hit his face on a rock. He denied LOC. Family said he was confused for a bit. No palpitations , CP or seizure activity. No further episodes. Had a repeat ECHO in 7/14 showed EF 25%, diffuse HK, mild MR, and mild TR. He has a family history of NICM (mother and brother) and his brother is s/p cardiac transplantation. He had a 24 holter monitor placed showing SR 41-112 with average 64 bpm, frequent PVCs with some NSVT. About 8% beats PVCs, also had some pauses ranging 2.0-2.8 seconds. He was then referred to HF clinic.  He has not been able to tolerate low doses of ACEI or beta blocker due to hypotension/orthostatic symptoms.  At last appointment, started him back on digoxin at low dose and spironolactone 12.5 mg daily.  He has been taking Lasix every other day.  He continues to have limiting right knee pain.  His CPX was submaximal because of this.  Family says that he "pants" with heavier exertion.  However, he still goes out to do yardwork.  He does get short of  breath with yardwork.  He is short of breath after walking 300-400 feet on flat ground or with walking up an incline.  Weight is down 5 lbs.    Seen in ER a few days ago with severe SOB. BNP was up. CXR with bilateral effusions.  Got 40 IV lasix and 1600cc urine out. Weighs everyday. Doesn't take lasix. Says BP is too low for lasix.   He was put on mexiletine by Dr Caryl Comes for frequent PVCs with the thought that these were contributing to his cardiomyopathy.  However, he stopped it because it made him feel bad.  He does not feel palpitations.   Today he returns HF follow up. Had knee injected by Dr Ninfa Linden last week. Says knee pain has resolved.  He was evaluated in [facility] ED March 7th for increased dyspnea. He was diuresed with 40 mg IV lasix and started on 20 mg lasix every other day. He was required extra lasix since then.  Overall feeling ok but complains of fatigue. Says he had mild dyspnea walking in the clinic.  SOB with exertion if he tries to walk briskly. Denies PND. + Orthopnea. SOB with steps. Sleeps on several pillows. Weight at home 171-173 pounds. Taking all medications.   R/LHC 11/02/2012 RA = 5  RV = 41/4/8  PA = 39/11 (27)  PCW = 21 v = 35  Fick cardiac output/index = 3.6/1.7  Thermo CO/CI = 3.2/1.5  PVR = 1.6 Woods  SVR = 2075  FA sat = 96%  PA sat = 57%, 60%  Ao Pressure: 115/69 (88)  LV Pressure: 118/18/22  There was no signficant gradient across the aortic valve on pullback.  Left main: Normal  LAD: Large vessel gives off 1 diagonal. 20-30% proximal lesion. Otherwise normal  LCX: Small ramus. Tiny OM-1. Moderate-sized OM-2. Normal  RCA: Dominant. Normal.  LV-gram done in the RAO projection: Ejection fraction = 15% severe global HK. I do not see clear MR jet  Assessment:  1. Minimal CAD  2. Severe LV dysfunction with low output physiology  3. Significant v-waves in PCWP tracing suggesting of MR   CPX 11/14/2012 Resting HR: 52 Peak HR: 137 (92% age predicted max HR) BP  rest: 112/76 BP peak: 140/77 Peak VO2: 24.4 (90.4% predicted peak VO2) VE/VCO2 slope: 29.3 OUES: 2.29 Peak RER: 1.18 Ventilatory Threshold: 18.4 (68.2% predicted peak VO2)  CPX 2/15 Peak VO2 21.7 VE/VCO2 slope 29 RER 1.12 Low normal functional capacity.   Echo (11/16) EF 20-25%, restrictive diastolic function, RV normal size/systolic function, mild MR, PA systolic pressure 47 mmHg.    CPX (11/16) Peak VO2 14.8, VE/VCO2 slope 38.9, RER 0.96.  Submaximal due to knee pain. Moderate restrictive lung pattern, probably at least moderate HF limitation but not conclusive as submaximal.   S/P Medtronic CRT-D 12/14/12   Labs (8/14): K 4.8, creatinine 0.9          (9/14): K 4.2, creatinine 0.91  (12/07/12)  K 4.8 Creatinine 0.9             (03/29/13 ) K 4.6 Creatinine 0.9 Pro BNP 895          (12/15): K 4.5, creatinine 1.0, BNP 895           (05/13/2015): K 4.0 Creatinine 1.04  BNP 1235   SH: Lives with his wife in [place].  Nonsmoker.   FH: 6 siblings 3 boys 3 girls         2 brothers: 1 with transplant (had frequent VT), other with HF and ICD         1 sister with CHF and ICD => LMNA cardiomyopathy diagnosed by genetic testing.         1 sister with ovarian cancer         1 sister OK          Father with CHF  ROS: All systems negative except as listed in HPI, PMH and Problem List.  Past Medical History  Diagnosis Date  . Nephrolithiasis   . GERD (gastroesophageal reflux disease)   . Insomnia   . Nonischemic cardiomyopathy ([place])     Cardiac catheterization 12/2011 with LVEF 45%, no significant obstructive CAD  . Automatic implantable cardioverter-defibrillator in situ   . H/O hiatal hernia   . Osteoarthritis     "knees" (12/14/2012)  . Pneumonia     Dec 2016  . CHF (congestive heart failure) ([place])     Current Outpatient Prescriptions  Medication Sig Dispense Refill  . benzonatate (TESSALON) 100 MG capsule Take 100 mg by mouth 3 (three) times daily as needed for cough.    . B  Complex Vitamins (B COMPLEX 50 PO) Take 1 tablet by mouth daily.    . Calcium Carbonate-Vitamin D (CALTRATE 600+D PO) Take 1  tablet by mouth daily.    . Carboxymethylcellulose Sodium (THERATEARS OP) Apply 1 drop to eye 3 (three) times daily.    Marland Kitchen Cod Liver Oil 1000 MG CAPS Take 1 capsule by mouth daily.    . digoxin (LANOXIN) 0.125 MG tablet Take 0.5 tablets (0.0625 mg total) by mouth daily. 90 tablet 3  . furosemide (LASIX) 40 MG tablet Take 0.5 tablets (20 mg total) by mouth 3 (three) times a week. MONDAY WEDNESDAY FRIDAY 30 tablet 3  . gatifloxacin (ZYMAXID) 0.5 % SOLN Place 1 drop into the left eye 4 (four) times daily.    . Glucosamine Sulfate 1000 MG CAPS Take 1,000 mg by mouth daily.    Marland Kitchen ketorolac (ACULAR) 0.5 % ophthalmic solution Place 1 drop into the left eye 4 (four) times daily.    . Multiple Vitamin (MULTIVITAMIN) tablet Take 1 tablet by mouth daily.    . pantoprazole (PROTONIX) 40 MG tablet Take 40 mg by mouth daily.    . potassium chloride SA (KLOR-CON M20) 20 MEQ tablet Take 1 tablet (20 mEq total) by mouth 3 (three) times a week. MONDAY WEDNESDAY FRIDAY 15 tablet 3  . prednisoLONE acetate (PRED FORTE) 1 % ophthalmic suspension Place 1 drop into the left eye 4 (four) times daily.    . Psyllium (METAMUCIL PO) Take 1 tablet by mouth daily.    Marland Kitchen spironolactone (ALDACTONE) 25 MG tablet Take 12.5 mg by mouth daily.    . vitamin A 8000 UNIT capsule Take 8,000 Units by mouth 3 (three) times a week.     . vitamin B-12 (CYANOCOBALAMIN) 1000 MCG tablet Take 1,000 mcg by mouth daily.     . vitamin C (ASCORBIC ACID) 500 MG tablet Take 500 mg by mouth every other day.      No current facility-administered medications for this encounter.    Filed Vitals:   05/23/15 1439  BP: 106/72  Pulse: 95  Weight: 178 lb (80.74 kg)  SpO2: 98%    Physical Exam: General: Well appearing. Mild dyspnea walking in the clinic. Wife present   HEENT: normal  Neck: supple. JVP 8-9. Carotids 2+ bilat; no  bruits. No lymphadenopathy or thryomegaly appreciated.  Cor: PMI laterally displaced. Regular rate & rhythm. No murmur.  No S3/S4.  Lungs: clear Abdomen: soft, nontender, nondistended. No hepatosplenomegaly. No bruits or masses. Good bowel sounds.  Extremities: no cyanosis, clubbing, rash, trace-1+ edema  Neuro: alert & oriented x 3, cranial nerves grossly intact. moves all 4 extremities w/o difficulty. Affect pleasant.  ASSESSMENT & PLAN:  1) Chronic systolic HF:  NYHA III, nonischemic cardiomyopathy.  Sister with LMNA cardiomyopathy, I suspect this with him as well (think this is familial).  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  EF 20-25% on 11/16 echo.  Has Medtronic CRT-D device.   His functional status continues to deteriorate due to a combination of knee pain and worsening HF. CPX was submaximal due to knee pain but indicates at least moderate HF limitation.   Today I set up CPX test as his knee pain has resolved. Discuss at next visit. At that point will consider RHC to further evaluate hemodynamics.  Optivol: Fluid trending up to threshold with impedance down. Activity ~ 1hours per day.  NYHAIIIB.   Mild volume overload noted. Increase lasix to 20 mg daily with low threshold to take and extra 20 mg if needed.  - He has been unable to take even low doses of beta blockers  or ACEIs due to dizziness/orthostasis.  -Need to consider corlanor at next visit.  - Continue digoxin 0.0625 mg daily.   - Continue spiro 12.5 mg daily. Consider increasing at next visit.  - ECHO in 2 weeks with Dr Haroldine Laws.   - Discussed advanced therapies today. He met with LVAD coordinator today and received information on the VAD.  2) Familial cardiomyopathy: Sister with LMNA cardiomyopathy diagnosed by genetic testing.  Suspect that he has it also, would fit dilated cardiomyopathy with conduction system disease (LBBB).   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  He was not able to tolerate mexiletine.  He has not been able to tolerate beta blockers.  NSVT noted on device interrogation. He follows with Dr. Caryl Comes     Follow up 2-3 weeks with Dr Haroldine Laws to discuss CPX and ECHO results. Will need RHC set up during that appointment. He understands if he has worsening dyspnea the he should call HF clinic ASAP.   Amy Clegg NP-C 05/23/2015

## 2015-05-23 NOTE — Progress Notes (Signed)
Advanced Heart Failure Medication Review by a Pharmacist  Does the patient  feel that his/her medications are working for him/her?  yes  Has the patient been experiencing any side effects to the medications prescribed?  no  Does the patient measure his/her own blood pressure or blood glucose at home?  no   Does the patient have any problems obtaining medications due to transportation or finances?   no  Understanding of regimen: good Understanding of indications: good Potential of compliance: good Patient understands to avoid NSAIDs. Patient understands to avoid decongestants.  Issues to address at subsequent visits: None   Pharmacist comments: 75 YO pleasant male presenting to HF clinic with his wife.  Pts wife states she gave him and extra 10 mg Lasix on Saturday with 10 meq KCl and extra 20 mg Lasix on Sunday with 20 meq KCl for Shortness of breath.  Pt denies other SE with his medications or any trouble obtaining his meds.      Time with patient: 10 min  Preparation and documentation time: 5 min  Total time: 15 min

## 2015-05-24 ENCOUNTER — Telehealth (HOSPITAL_COMMUNITY): Payer: Self-pay | Admitting: Cardiology

## 2015-05-24 DIAGNOSIS — I5022 Chronic systolic (congestive) heart failure: Secondary | ICD-10-CM

## 2015-05-24 MED ORDER — FUROSEMIDE 40 MG PO TABS: 20.0000 mg | ORAL_TABLET | Freq: Every day | ORAL | Status: DC

## 2015-05-24 NOTE — Progress Notes (Unsigned)
Edgar Grinder, NP asked that VAD coordinator meet with patient and wife regarding LVAD introduction/education.   I have had a lengthy discussion with patient and wife regarding advanced heart failure options. Patient expressed interest in advanced heart failure options. I have explained that if the patient proceeds to advanced heart failure options then he would need to undergo an evaluation process that would include a series of laboratory, non-invasive, invasive and consultative services. The goals of the evaluation process are to determine all relevant factors that influence the decision to proceed with Advance Heart Failure options. Additionally, the results of the evaluation may yield data that would prevent Advanced Heart Failure options.   Patient asked to learn more about LVADs and receive education on the same. VAD coordinator met with patient and wife at end of clinic visit and gave VAD teaching materials including [employer] HM II Education packet. I explained functions and indications for HM II LVAD including different indications including bridge to transplant and destination therapy and what those mean. Demonstrated the pump and external equipment. Pt's brother had VAD before transplant at [facility] and pt remembers large battery pack on wheels.  Explained and demonstrated differences in past pump/ancillary equipment and HM II pump/ancillary equipment. Pt and wife also attend church with one of our VAD patient who recently had his implant and they feel comfortable talking with VAD pt and his wife (who is primary caregiver) about LVAD. Patient and wife asked appropriate questions, had good interaction with VAD coordinator, and verbalized understanding of above.   The patient understands that from this discussion it does not mean that they will receive the device, but that depends on an extensive evaluation process if it comes to that. Pt is scheduled for CPX and echo in the near future and Dr. Haroldine Laws will  review those results and discuss with pt and wife. If advance heart failure options are necessary, we will discuss VAD evaluation in more detail at that time.

## 2015-05-24 NOTE — Telephone Encounter (Signed)
Patient called to request refill of  Lasix to mail order pharmacy

## 2015-06-05 ENCOUNTER — Ambulatory Visit (HOSPITAL_COMMUNITY): Payer: Medicare Other | Attending: Internal Medicine

## 2015-06-05 ENCOUNTER — Other Ambulatory Visit (HOSPITAL_COMMUNITY): Payer: Self-pay | Admitting: *Deleted

## 2015-06-05 DIAGNOSIS — I5022 Chronic systolic (congestive) heart failure: Secondary | ICD-10-CM | POA: Insufficient documentation

## 2015-06-10 ENCOUNTER — Ambulatory Visit (HOSPITAL_BASED_OUTPATIENT_CLINIC_OR_DEPARTMENT_OTHER)
Admission: RE | Admit: 2015-06-10 | Discharge: 2015-06-10 | Disposition: A | Discharge status: Home / Self Care | Payer: Medicare Other | Source: Ambulatory Visit | Attending: Internal Medicine | Admitting: Internal Medicine

## 2015-06-10 ENCOUNTER — Ambulatory Visit (HOSPITAL_COMMUNITY)
Admission: RE | Admit: 2015-06-10 | Discharge: 2015-06-10 | Disposition: A | Discharge status: Home / Self Care | Payer: Medicare Other | Source: Ambulatory Visit | Attending: Family Medicine | Admitting: Family Medicine

## 2015-06-10 VITALS — BP 90/54 | HR 63 | Wt 169.2 lb

## 2015-06-10 DIAGNOSIS — I447 Left bundle-branch block, unspecified: Secondary | ICD-10-CM | POA: Diagnosis not present

## 2015-06-10 DIAGNOSIS — I5022 Chronic systolic (congestive) heart failure: Secondary | ICD-10-CM

## 2015-06-10 DIAGNOSIS — Z79899 Other long term (current) drug therapy: Secondary | ICD-10-CM | POA: Insufficient documentation

## 2015-06-10 DIAGNOSIS — K219 Gastro-esophageal reflux disease without esophagitis: Secondary | ICD-10-CM | POA: Diagnosis not present

## 2015-06-10 DIAGNOSIS — I517 Cardiomegaly: Secondary | ICD-10-CM | POA: Diagnosis not present

## 2015-06-10 DIAGNOSIS — I071 Rheumatic tricuspid insufficiency: Secondary | ICD-10-CM | POA: Insufficient documentation

## 2015-06-10 DIAGNOSIS — I340 Nonrheumatic mitral (valve) insufficiency: Secondary | ICD-10-CM | POA: Insufficient documentation

## 2015-06-10 DIAGNOSIS — I428 Other cardiomyopathies: Secondary | ICD-10-CM | POA: Diagnosis not present

## 2015-06-10 MED ORDER — SPIRONOLACTONE 25 MG PO TABS: 25.0000 mg | ORAL_TABLET | Freq: Every day | ORAL | Status: DC

## 2015-06-10 MED ORDER — FUROSEMIDE 40 MG PO TABS: 20.0000 mg | ORAL_TABLET | ORAL | Status: DC | PRN

## 2015-06-10 NOTE — Progress Notes (Signed)
Patient ID: [name], male   DOB: 1940-06-05, 75 y.o.   MRN: [id number]  ADVANCED HF CLINIC NOTE  Patient ID: [name], male   DOB: [birth year], 75 y.o.   MRN: [id number] Referring Physician: Dr. Diona Browner  Primary Care: Dr. Donzetta Sprung [facility])   HPI:  [name] is a pleasant 75 yo male with a h/o familial cardiomyopathy and LBBB who was referred to the clinic by Dr. Diona Browner for a decrease in his EF.  In 2003 he had an echo with EF 45%. ECHO in 2007 showed EF 50-55%.   6 siblings 3 boys, 3 girls  2 brothers: 1 with transplant (had frequent VT), other with HF and ICD  1 sister with CHF and ICD.  Sister found to have LMNA mutation.  1 sister with ovarian cancer  1 sister OK  Father with CHF  Was doing well until July 2014 when he was outside and bent over to turn off spigot and got very dizzy and he fell over and caught himself before he hit his face on a rock. He denied LOC. Family said he was confused for a bit. No palpitations , CP or seizure activity. No further episodes. Had a repeat ECHO in 7/14 showed EF 25%, diffuse HK, mild MR, and mild TR. He has a family history of NICM (mother and brother) and his brother is s/p cardiac transplantation. He had a 24 holter monitor placed showing SR 41-112 with average 64 bpm, frequent PVCs with some NSVT. About 8% beats PVCs, also had some pauses ranging 2.0-2.8 seconds. He was then referred to HF clinic.  He has not been able to tolerate low doses of ACEI or beta blocker due to hypotension/orthostatic symptoms.  At last appointment, started him back on digoxin at low dose and spironolactone 12.5 mg daily.  He has been taking Lasix every other day.  He continues to have limiting right knee pain.  His CPX was submaximal because of this.  Family says that he "pants" with heavier exertion.  However, he still goes out to do yardwork.  He does get short of breath with yardwork.  He is short of breath after walking 300-400 feet on flat  ground or with walking up an incline.  Weight is down 5 lbs.    Seen in ER a few days ago with severe SOB. BNP was up. CXR with bilateral effusions.  Got 40 IV lasix and 1600cc urine out. Weighs everyday. Doesn't take lasix. Says BP is too low for lasix.   He was put on mexiletine by Dr Graciela Husbands for frequent PVCs with the thought that these were contributing to his cardiomyopathy.  However, he stopped it because it made him feel bad.  He does not feel palpitations.   Had knee injection with Dr. Magnus Ivan in February 2017 and felt better.   Today he returns HF follow up. Recently had the flu and PNA treated with doxycycline and prednisone for 5 days. Now feeling much better. Had CPX last month and was submax test as he wasn't feeling well. pVO2 16.8 Ve/VCO2 43 RER 0.95.Marland Kitchen Now able to do anything he wants. Off lasix for 1 week now. No swelling, orthopnea, PND. Walked 5 miles on Saturday.   Echo today EF 20% RV moderately reduced.   ICD interrogated personally. No VT/AF. Activity level coming back up now 2 hours/day. Fluid level ok.    R/LHC 11/02/2012 RA = 5  RV = 41/4/8  PA = 39/11 (27)  PCW =  21 v = 35  Fick cardiac output/index = 3.6/1.7  Thermo CO/CI = 3.2/1.5  PVR = 1.6 Woods  SVR = 2075  FA sat = 96%  PA sat = 57%, 60%  Ao Pressure: 115/69 (88)  LV Pressure: 118/18/22  There was no signficant gradient across the aortic valve on pullback.  Left main: Normal  LAD: Large vessel gives off 1 diagonal. 20-30% proximal lesion. Otherwise normal  LCX: Small ramus. Tiny OM-1. Moderate-sized OM-2. Normal  RCA: Dominant. Normal.  LV-gram done in the RAO projection: Ejection fraction = 15% severe global HK. I do not see clear MR jet  Assessment:  1. Minimal CAD  2. Severe LV dysfunction with low output physiology  3. Significant v-waves in PCWP tracing suggesting of MR    CPX 3/17  FVC 3.37 (73%)    FEV1 2.62 (77%)     FEV1/FVC 78 (103%)     MVV 126 (94%)  Resting HR: 77 Peak  HR: 129  (88% age predicted max HR) BP rest: 100/62 BP peak: 130/68 Peak VO2: 16.8 (64% predicted peak VO2) VE/VCO2 slope: 43 OUES: 1.93 Peak RER: 0.95 VE/MVV: 48% PETCO2 at peak: 23 O2pulse: 10  (71% predicted O2pulse)  CPX (11/16) Peak VO2 14.8, VE/VCO2 slope 38.9, RER 0.96.  Submaximal due to knee pain. Moderate restrictive lung pattern, probably at least moderate HF limitation but not conclusive as submaximal.   CPX 2/15 Peak VO2 21.7 VE/VCO2 slope 29 RER 1.12 Low normal functional capacity.   Echo (11/16) EF 20-25%, restrictive diastolic function, RV normal size/systolic function, mild MR, PA systolic pressure 47 mmHg.      S/P Medtronic CRT-D 12/14/12   Labs (8/14): K 4.8, creatinine 0.9          (9/14): K 4.2, creatinine 0.91  (12/07/12)  K 4.8 Creatinine 0.9             (03/29/13 ) K 4.6 Creatinine 0.9 Pro BNP 895          (12/15): K 4.5, creatinine 1.0, BNP 895           (05/13/2015): K 4.0 Creatinine 1.04  BNP 1235   SH: Lives with his wife in [place].  Nonsmoker.   FH: 6 siblings 3 boys 3 girls         2 brothers: 1 with transplant (had frequent VT), other with HF and ICD         1 sister with CHF and ICD => LMNA cardiomyopathy diagnosed by genetic testing.         1 sister with ovarian cancer         1 sister OK          Father with CHF  ROS: All systems negative except as listed in HPI, PMH and Problem List.  Past Medical History  Diagnosis Date  . Nephrolithiasis   . GERD (gastroesophageal reflux disease)   . Insomnia   . Nonischemic cardiomyopathy ([facility])     Cardiac catheterization 12/2011 with LVEF 45%, no significant obstructive CAD  . Automatic implantable cardioverter-defibrillator in situ   . H/O hiatal hernia   . Osteoarthritis     "knees" (12/14/2012)  . Pneumonia     Dec 2016  . CHF (congestive heart failure) ([facility])     Current Outpatient Prescriptions  Medication Sig Dispense Refill  . Calcium Carbonate-Vitamin D (CALTRATE 600+D PO)  Take 1 tablet by mouth daily.    . Carboxymethylcellulose Sodium (THERATEARS OP) Apply 1 drop to  eye 3 (three) times daily as needed (dry eyes).     Marland Kitchen Cod Liver Oil 1000 MG CAPS Take 1 capsule by mouth daily.    . digoxin (LANOXIN) 0.125 MG tablet Take 0.5 tablets (0.0625 mg total) by mouth daily. 90 tablet 3  . doxycycline (VIBRA-TABS) 100 MG tablet Take 100 mg by mouth 2 (two) times daily.  0  . gatifloxacin (ZYMAXID) 0.5 % SOLN Place 1 drop into the left eye 2 (two) times daily.     . Glucosamine Sulfate 1000 MG CAPS Take 1,000 mg by mouth daily.    Marland Kitchen ketorolac (ACULAR) 0.5 % ophthalmic solution Place 1 drop into the left eye 2 (two) times daily.     . Multiple Vitamin (MULTIVITAMIN) tablet Take 1 tablet by mouth daily.    . pantoprazole (PROTONIX) 40 MG tablet Take 40 mg by mouth daily.    . potassium chloride SA (KLOR-CON M20) 20 MEQ tablet Take 1 tablet (20 mEq total) by mouth 3 (three) times a week. MONDAY WEDNESDAY FRIDAY 15 tablet 3  . prednisoLONE acetate (PRED FORTE) 1 % ophthalmic suspension Place 1 drop into the left eye 4 (four) times daily.    . Psyllium (METAMUCIL PO) Take 1 tablet by mouth daily.    Marland Kitchen spironolactone (ALDACTONE) 25 MG tablet Take 12.5 mg by mouth daily.    . vitamin A 8000 UNIT capsule Take 8,000 Units by mouth 3 (three) times a week.     . vitamin B-12 (CYANOCOBALAMIN) 100 MCG tablet Take 100 mcg by mouth daily.    . vitamin C (ASCORBIC ACID) 500 MG tablet Take 500 mg by mouth every other day.     . furosemide (LASIX) 40 MG tablet Take 0.5 tablets (20 mg total) by mouth daily. (Patient not taking: Reported on 06/10/2015) 270 tablet 3   No current facility-administered medications for this encounter.    Filed Vitals:   06/10/15 1048  BP: 90/54  Pulse: 63  Weight: 169 lb 4 oz (76.771 kg)  SpO2: 95%    Physical Exam: General: Well appearing. Family present   HEENT: normal  Neck: supple. JVP 5. Carotids 2+ bilat; no bruits. No lymphadenopathy or  thryomegaly appreciated.  Cor: PMI laterally displaced. Regular rate & rhythm. No murmur.  No S3/S4.  Lungs: clear Abdomen: soft, nontender, nondistended. No hepatosplenomegaly. No bruits or masses. Good bowel sounds.  Extremities: no cyanosis, clubbing, rash, no edema  Neuro: alert & oriented x 3, cranial nerves grossly intact. moves all 4 extremities w/o difficulty. Affect pleasant.  ASSESSMENT & PLAN:  1) Chronic systolic HF:  NYHA III, nonischemic cardiomyopathy.  Sister with LMNA cardiomyopathy.  He has a dilated cardiomyopathy with LBBB, which would fit LMNA cardiomyopathy as well.  EF 20-25% on 11/16 echo.  Has Medtronic CRT-D device.  CPX was submaximal due to knee pain but indicates at least moderate HF limitation.   -He has been improving slowly NYHA II-III but I worry he is still tenuous. Volume status ok.  - He has been unable to take even low doses of beta blockers or ACEIs due to dizziness/orthostasis.  -Need to consider corlanor at next visit.  - Continue digoxin 0.0625 mg daily.   - Increase spiro 25 daily. Use lasix prn  - Check BMET and digoxin level in 1 week - Repeat CPX in 3-6 months - He hasmet with LVAD coordinator today and received information on the VAD.  2) Familial cardiomyopathy: Sister with LMNA cardiomyopathy diagnosed by genetic testing.  Suspect that he has it also, would fit dilated cardiomyopathy with conduction system disease (LBBB).   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  He was not able to tolerate mexiletine.  He has not been able to tolerate beta blockers.  NSVT noted on device interrogation. He follows with Dr. [place] Bing, Edgar Boom MD  06/10/2015

## 2015-06-10 NOTE — Progress Notes (Signed)
Echocardiogram 2D Echocardiogram has been performed.  [name] 06/10/2015, 9:43 AM

## 2015-06-10 NOTE — Progress Notes (Signed)
Advanced Heart Failure Medication Review by a Pharmacist  Does the patient  feel that his/her medications are working for him/her?  yes  Has the patient been experiencing any side effects to the medications prescribed?  no  Does the patient measure his/her own blood pressure or blood glucose at home?  yes   Does the patient have any problems obtaining medications due to transportation or finances?   no  Understanding of regimen: good Understanding of indications: good Potential of compliance: good Patient understands to avoid NSAIDs. Patient understands to avoid decongestants.  Issues to address at subsequent visits: None   Pharmacist comments:  [name] is a pleasant 75 yo M presenting with his wife and 2 daughters. He reports good compliance with his medications except that he has not taken lasix since last Wednesday and his weights have been stable since. He had a cough for ~2 months which has resolved since initiating doxycycline + prednisone last week. No other medication-related questions or concerns were identified at this time.   Tyler Deis. Bonnye Fava, PharmD, BCPS, CPP Clinical Pharmacist Pager: [phone] Phone: [phone] 06/10/2015 11:10 AM      Time with patient: 8 minutes Preparation and documentation time: 2 minutes Total time: 10 minutes

## 2015-06-10 NOTE — Patient Instructions (Signed)
INCREASE Spirolactone to 25 mg, one tab daily CHANGE Lasix to as needed for swelling  Labs needed in one week with Dr.Daniels  Your physician recommends that you schedule a follow-up appointment in: 6 weeks with Dr.Bensimhon  Do the following things EVERYDAY: 1) Weigh yourself in the morning before breakfast. Write it down and keep it in a log. 2) Take your medicines as prescribed 3) Eat low salt foods-Limit salt (sodium) to 2000 mg per day.  4) Stay as active as you can everyday 5) Limit all fluids for the day to less than 2 liters 6)

## 2015-06-25 ENCOUNTER — Encounter: Payer: Self-pay | Admitting: Internal Medicine

## 2015-07-12 ENCOUNTER — Other Ambulatory Visit (HOSPITAL_COMMUNITY): Payer: Self-pay | Admitting: Internal Medicine

## 2015-07-16 NOTE — Patient Instructions (Signed)
Your procedure is scheduled on:  07/22/2015  Report to [facility] at  0700   AM.  Call this number if you have problems the morning of surgery: [phone]   Do not eat food or drink liquids :After Midnight.      Take these medicines the morning of surgery with A SIP OF WATER: digoxin, protonix.   Do not wear jewelry, make-up or nail polish.  Do not wear lotions, powders, or perfumes. You may wear deodorant.  Do not shave 48 hours prior to surgery.  Do not bring valuables to the hospital.  Contacts, dentures or bridgework may not be worn into surgery.  Leave suitcase in the car. After surgery it may be brought to your room.  For patients admitted to the hospital, checkout time is 11:00 AM the day of discharge.   Patients discharged the day of surgery will not be allowed to drive home.  :     Please read over the following fact sheets that you were given: Coughing and Deep Breathing, Surgical Site Infection Prevention, Anesthesia Post-op Instructions and Care and Recovery After Surgery    Cataract A cataract is a clouding of the lens of the eye. When a lens becomes cloudy, vision is reduced based on the degree and nature of the clouding. Many cataracts reduce vision to some degree. Some cataracts make people more near-sighted as they develop. Other cataracts increase glare. Cataracts that are ignored and become worse can sometimes look white. The white color can be seen through the pupil. CAUSES   Aging. However, cataracts may occur at any age, even in newborns.   Certain drugs.   Trauma to the eye.   Certain diseases such as diabetes.   Specific eye diseases such as chronic inflammation inside the eye or a sudden attack of a rare form of glaucoma.   Inherited or acquired medical problems.  SYMPTOMS   Gradual, progressive drop in vision in the affected eye.   Severe, rapid visual loss. This most often happens when trauma is the cause.  DIAGNOSIS  To detect a cataract, an eye  doctor examines the lens. Cataracts are best diagnosed with an exam of the eyes with the pupils enlarged (dilated) by drops.  TREATMENT  For an early cataract, vision may improve by using different eyeglasses or stronger lighting. If that does not help your vision, surgery is the only effective treatment. A cataract needs to be surgically removed when vision loss interferes with your everyday activities, such as driving, reading, or watching TV. A cataract may also have to be removed if it prevents examination or treatment of another eye problem. Surgery removes the cloudy lens and usually replaces it with a substitute lens (intraocular lens, IOL).  At a time when both you and your doctor agree, the cataract will be surgically removed. If you have cataracts in both eyes, only one is usually removed at a time. This allows the operated eye to heal and be out of danger from any possible problems after surgery (such as infection or poor wound healing). In rare cases, a cataract may be doing damage to your eye. In these cases, your caregiver may advise surgical removal right away. The vast majority of people who have cataract surgery have better vision afterward. HOME CARE INSTRUCTIONS  If you are not planning surgery, you may be asked to do the following:  Use different eyeglasses.   Use stronger or brighter lighting.   Ask your eye doctor about reducing your  medicine dose or changing medicines if it is thought that a medicine caused your cataract. Changing medicines does not make the cataract go away on its own.   Become familiar with your surroundings. Poor vision can lead to injury. Avoid bumping into things on the affected side. You are at a higher risk for tripping or falling.   Exercise extreme care when driving or operating machinery.   Wear sunglasses if you are sensitive to bright light or experiencing problems with glare.  SEEK IMMEDIATE MEDICAL CARE IF:   You have a worsening or sudden  vision loss.   You notice redness, swelling, or increasing pain in the eye.   You have a fever.  Document Released: 02/23/2005 Document Revised: 02/12/2011 Document Reviewed: 10/17/2010 [facility] Patient Information 2012 [place].PATIENT INSTRUCTIONS POST-ANESTHESIA  IMMEDIATELY FOLLOWING SURGERY:  Do not drive or operate machinery for the first twenty four hours after surgery.  Do not make any important decisions for twenty four hours after surgery or while taking narcotic pain medications or sedatives.  If you develop intractable nausea and vomiting or a severe headache please notify your doctor immediately.  FOLLOW-UP:  Please make an appointment with your surgeon as instructed. You do not need to follow up with anesthesia unless specifically instructed to do so.  WOUND CARE INSTRUCTIONS (if applicable):  Keep a dry clean dressing on the anesthesia/puncture wound site if there is drainage.  Once the wound has quit draining you may leave it open to air.  Generally you should leave the bandage intact for twenty four hours unless there is drainage.  If the epidural site drains for more than 36-48 hours please call the anesthesia department.  QUESTIONS?:  Please feel free to call your physician or the hospital operator if you have any questions, and they will be happy to assist you.

## 2015-07-17 ENCOUNTER — Encounter (HOSPITAL_COMMUNITY)
Admission: RE | Admit: 2015-07-17 | Discharge: 2015-07-17 | Disposition: A | Discharge status: Home / Self Care | Payer: Medicare Other | Source: Ambulatory Visit | Attending: Ophthalmology | Admitting: Ophthalmology

## 2015-07-19 MED ORDER — CYCLOPENTOLATE-PHENYLEPHRINE OP SOLN OPTIME - NO CHARGE
OPHTHALMIC | Status: AC
  Filled 2015-07-19: qty 2

## 2015-07-19 MED ORDER — TETRACAINE HCL 0.5 % OP SOLN
OPHTHALMIC | Status: AC
  Filled 2015-07-19: qty 4

## 2015-07-19 MED ORDER — LIDOCAINE HCL 3.5 % OP GEL
OPHTHALMIC | Status: AC
  Filled 2015-07-19: qty 1

## 2015-07-22 ENCOUNTER — Encounter (HOSPITAL_COMMUNITY): Payer: Self-pay

## 2015-07-22 ENCOUNTER — Ambulatory Visit (HOSPITAL_COMMUNITY)
Admission: RE | Admit: 2015-07-22 | Discharge: 2015-07-22 | Disposition: A | Discharge status: Home / Self Care | Payer: Medicare Other | Source: Ambulatory Visit | Attending: Ophthalmology | Admitting: Ophthalmology

## 2015-07-22 ENCOUNTER — Ambulatory Visit (HOSPITAL_COMMUNITY): Payer: Medicare Other | Admitting: Anesthesiology

## 2015-07-22 ENCOUNTER — Encounter (HOSPITAL_COMMUNITY)
Admission: RE | Disposition: A | Discharge status: Home / Self Care | Payer: Self-pay | Source: Ambulatory Visit | Attending: Ophthalmology

## 2015-07-22 DIAGNOSIS — Z950 Presence of cardiac pacemaker: Secondary | ICD-10-CM | POA: Insufficient documentation

## 2015-07-22 DIAGNOSIS — Z79899 Other long term (current) drug therapy: Secondary | ICD-10-CM | POA: Insufficient documentation

## 2015-07-22 DIAGNOSIS — K219 Gastro-esophageal reflux disease without esophagitis: Secondary | ICD-10-CM | POA: Diagnosis not present

## 2015-07-22 DIAGNOSIS — M1991 Primary osteoarthritis, unspecified site: Secondary | ICD-10-CM | POA: Diagnosis not present

## 2015-07-22 DIAGNOSIS — I509 Heart failure, unspecified: Secondary | ICD-10-CM | POA: Insufficient documentation

## 2015-07-22 DIAGNOSIS — H269 Unspecified cataract: Secondary | ICD-10-CM | POA: Insufficient documentation

## 2015-07-22 HISTORY — PX: CATARACT EXTRACTION W/PHACO: SHX586

## 2015-07-22 SURGERY — PHACOEMULSIFICATION, CATARACT, WITH IOL INSERTION
Anesthesia: Monitor Anesthesia Care | Laterality: Right

## 2015-07-22 MED ORDER — PHENYLEPHRINE-KETOROLAC 1-0.3 % IO SOLN
INTRAOCULAR | Status: AC
  Filled 2015-07-22: qty 4

## 2015-07-22 MED ORDER — LIDOCAINE HCL 3.5 % OP GEL
OPHTHALMIC | Status: DC | PRN
  Administered 2015-07-22: 1 via OPHTHALMIC

## 2015-07-22 MED ORDER — TETRACAINE HCL 0.5 % OP SOLN
1.0000 [drp] | OPHTHALMIC | Status: AC
  Administered 2015-07-22 (×3): 1 [drp] via OPHTHALMIC

## 2015-07-22 MED ORDER — FENTANYL CITRATE (PF) 100 MCG/2ML IJ SOLN
25.0000 ug | INTRAMUSCULAR | Status: AC
  Administered 2015-07-22: 25 ug via INTRAVENOUS

## 2015-07-22 MED ORDER — MIDAZOLAM HCL 2 MG/2ML IJ SOLN
INTRAMUSCULAR | Status: AC
  Filled 2015-07-22: qty 2

## 2015-07-22 MED ORDER — LIDOCAINE HCL 3.5 % OP GEL
OPHTHALMIC | Status: AC
  Filled 2015-07-22: qty 1

## 2015-07-22 MED ORDER — LACTATED RINGERS IV SOLN
INTRAVENOUS | Status: DC
  Administered 2015-07-22: 07:00:00 via INTRAVENOUS

## 2015-07-22 MED ORDER — NA HYALUR & NA CHOND-NA HYALUR 0.55-0.5 ML IO KIT
PACK | INTRAOCULAR | Status: DC | PRN
  Administered 2015-07-22: 1 via OPHTHALMIC

## 2015-07-22 MED ORDER — CYCLOPENTOLATE-PHENYLEPHRINE 0.2-1 % OP SOLN
1.0000 [drp] | OPHTHALMIC | Status: AC
  Administered 2015-07-22 (×3): 1 [drp] via OPHTHALMIC

## 2015-07-22 MED ORDER — FENTANYL CITRATE (PF) 100 MCG/2ML IJ SOLN
INTRAMUSCULAR | Status: AC
  Filled 2015-07-22: qty 2

## 2015-07-22 MED ORDER — TETRACAINE HCL 0.5 % OP SOLN
OPHTHALMIC | Status: AC
  Filled 2015-07-22: qty 4

## 2015-07-22 MED ORDER — BSS IO SOLN
INTRAOCULAR | Status: DC | PRN
  Administered 2015-07-22: 500 mL via OPHTHALMIC

## 2015-07-22 MED ORDER — CYCLOPENTOLATE-PHENYLEPHRINE OP SOLN OPTIME - NO CHARGE
OPHTHALMIC | Status: AC
  Filled 2015-07-22: qty 2

## 2015-07-22 MED ORDER — TETRACAINE 0.5 % OP SOLN OPTIME - NO CHARGE
OPHTHALMIC | Status: DC | PRN
  Administered 2015-07-22: 2 [drp] via OPHTHALMIC

## 2015-07-22 MED ORDER — MIDAZOLAM HCL 2 MG/2ML IJ SOLN
1.0000 mg | INTRAMUSCULAR | Status: DC | PRN
  Administered 2015-07-22: 2 mg via INTRAVENOUS

## 2015-07-22 MED ORDER — POVIDONE-IODINE 5 % OP SOLN
OPHTHALMIC | Status: DC | PRN
  Administered 2015-07-22: 1 via OPHTHALMIC

## 2015-07-22 MED ORDER — BSS IO SOLN
INTRAOCULAR | Status: DC | PRN
  Administered 2015-07-22: 15 mL

## 2015-07-22 SURGICAL SUPPLY — 9 items
CLOTH BEACON ORANGE TIMEOUT ST (SAFETY) ×2 IMPLANT
GLOVE BIOGEL PI IND STRL 6.5 (GLOVE) ×1 IMPLANT
GLOVE BIOGEL PI IND STRL 7.0 (GLOVE) ×1 IMPLANT
GLOVE BIOGEL PI INDICATOR 6.5 (GLOVE) ×1
GLOVE BIOGEL PI INDICATOR 7.0 (GLOVE) ×1
INST SET CATARACT ~~LOC~~ (KITS) ×2 IMPLANT
LENS ALC ACRYL/TECN (Ophthalmic Related) ×2 IMPLANT
PAD ARMBOARD 7.5X6 YLW CONV (MISCELLANEOUS) ×2 IMPLANT
WATER STERILE IRR 250ML POUR (IV SOLUTION) ×2 IMPLANT

## 2015-07-22 NOTE — Op Note (Signed)
07/22/2015  9:27 AM  PATIENT:  [name]  75 y.o. male  PRE-OPERATIVE DIAGNOSIS:  cataract right eye, difficulty performing daily activities  POST-OPERATIVE DIAGNOSIS:  cataract right eye, difficulty performing daily activities  PROCEDURE:  Procedure(s): CATARACT EXTRACTION PHACO AND INTRAOCULAR LENS PLACEMENT RIGHT EYE CDE=4.50  SURGEON:  Surgeon(s): Susa Simmonds, MD  ASSISTANTS:  Laurena Bering, CST   ANESTHESIA STAFF: Anesthesiologist: Laurene Footman, MD CRNA: Despina Hidden, CRNA  ANESTHESIA:   topical and MAC  REQUESTED LENS POWER: 17.50  LENS IMPLANT INFORMATION:  Alcon SN60WF   +17.50  S/n [id number]  Exp 08/2019  CUMULATIVE DISSIPATED ENERGY:4.50  INDICATIONS:see scaqnned office H&P for details  OP FINDINGS:moderately dense NS  COMPLICATIONS:None  PROCEDURE:  The patient was brought to the operating room in good condition.  The operative eye was prepped and draped in the usual fashion for intraocular surgery.  Lidocaine gel was dropped onto the eye.  A 2.4 mm 10 O'clock near clear corneal stepped incision and a 12 O'clock stab incision were created.  Viscoat was instilled into the anterior chamber.  The 5 mm anterior capsulorhexis was performed with a bent needle cystotome and Utrata forceps.  The lens was hydrodissected and hydrodelineated with a cannula and balanced salt solution and rotated with a Kuglen hook.  Phacoemulsification was perfomed in the divide and conquer technique.  The remaining cortex was removed with I&A and the capsular surfaces polished as necessary.  Provisc was placed into the capsular bag and the lens inserted with the Alcon inserter.  The viscoelastic was removed with I&A and the lens "rocked" into position.  The wounds were hydrated and te anterior chamber was refilled with balanced salt solution.  The wounds were checked for leakage and rehydrated as necessary.  The lid speculum and drapes were removed and the patient was  transported to short stay in good condition.  PATIENT DISPOSITION:  Short stay

## 2015-07-22 NOTE — Anesthesia Preprocedure Evaluation (Signed)
Anesthesia Evaluation  Patient identified by MRN, date of birth, ID band Patient awake    Reviewed: Allergy & Precautions, NPO status , Patient's Chart, lab work & pertinent test results  Airway Mallampati: II  TM Distance: >3 FB Neck ROM: Full    Dental  (+) Caps, Missing,    Pulmonary pneumonia, resolved,    Pulmonary exam normal        Cardiovascular +CHF  Normal cardiovascular exam+ pacemaker + Cardiac Defibrillator      Neuro/Psych    GI/Hepatic hiatal hernia, GERD  Medicated and Controlled,  Endo/Other    Renal/GU Renal InsufficiencyRenal disease     Musculoskeletal  (+) Arthritis , Osteoarthritis,    Abdominal Normal abdominal exam  (+) + scaphoid   Peds  Hematology   Anesthesia Other Findings   Reproductive/Obstetrics                             Anesthesia Physical Anesthesia Plan  ASA: IV  Anesthesia Plan: MAC   Post-op Pain Management:    Induction: Intravenous  Airway Management Planned: Nasal Cannula  Additional Equipment:   Intra-op Plan:   Post-operative Plan:   Informed Consent: I have reviewed the patients History and Physical, chart, labs and discussed the procedure including the risks, benefits and alternatives for the proposed anesthesia with the patient or authorized representative who has indicated his/her understanding and acceptance.   Dental advisory given  Plan Discussed with: CRNA  Anesthesia Plan Comments:         Anesthesia Quick Evaluation  

## 2015-07-22 NOTE — Anesthesia Postprocedure Evaluation (Signed)
Anesthesia Post Note  Patient: BLAYZE YEBRA  Procedure(s) Performed: Procedure(s) (LRB): CATARACT EXTRACTION PHACO AND INTRAOCULAR LENS PLACEMENT RIGHT EYE CDE=4.50 (Right)  Patient location during evaluation: Short Stay Anesthesia Type: MAC Level of consciousness: awake and alert, oriented and patient cooperative Pain management: pain level controlled Vital Signs Assessment: post-procedure vital signs reviewed and stable Respiratory status: spontaneous breathing, nonlabored ventilation and respiratory function stable Cardiovascular status: blood pressure returned to baseline and stable Postop Assessment: no signs of nausea or vomiting Anesthetic complications: no    Last Vitals:  Filed Vitals:   07/22/15 0740 07/22/15 0745  BP: 87/61   Pulse:    Temp:    Resp: 18 18    Last Pain: There were no vitals filed for this visit.               Bessye Stith J

## 2015-07-22 NOTE — H&P (Signed)
I have reviewed the pre printed H&P, the patient was re-examined, and I have identified no significant interval changes in the patient's medical condition.  There is no change in the plan of care since the history and physical of record. 

## 2015-07-22 NOTE — Discharge Instructions (Signed)

## 2015-07-22 NOTE — Transfer of Care (Signed)
Immediate Anesthesia Transfer of Care Note  Patient: [name]  Procedure(s) Performed: Procedure(s): CATARACT EXTRACTION PHACO AND INTRAOCULAR LENS PLACEMENT RIGHT EYE CDE=4.50 (Right)  Patient Location: Short Stay  Anesthesia Type:MAC  Level of Consciousness: awake, alert , oriented and patient cooperative  Airway & Oxygen Therapy: Patient Spontanous Breathing  Post-op Assessment: Report given to RN, Post -op Vital signs reviewed and stable and Patient moving all extremities  Post vital signs: Reviewed and stable  Last Vitals:  Filed Vitals:   07/22/15 0740 07/22/15 0745  BP: 87/61   Pulse:    Temp:    Resp: 18 18    Last Pain: There were no vitals filed for this visit.    Patients Stated Pain Goal: 6 (07/22/15 1610)  Complications: No apparent anesthesia complications

## 2015-07-22 NOTE — Brief Op Note (Signed)
07/22/2015  9:22 AM  PATIENT:  [name]  75 y.o. male  PRE-OPERATIVE DIAGNOSIS:  cataract right eye, difficulty performing daily activities  POST-OPERATIVE DIAGNOSIS:  cataract right eye, difficulty performing daily activities  PROCEDURE:  Procedure(s): CATARACT EXTRACTION PHACO AND INTRAOCULAR LENS PLACEMENT RIGHT EYE CDE=4.50  SURGEON:  Surgeon(s): Susa Simmonds, MD  ASSISTANTS:  Laurena Bering, CST   ANESTHESIA STAFF: Anesthesiologist: Laurene Footman, MD CRNA: Despina Hidden, CRNA  ANESTHESIA:   topical and MAC  REQUESTED LENS POWER: 17.50  LENS IMPLANT INFORMATION:  Alcon SN60WF   +17.50  S/n [id number]  Exp 08/2019  CUMULATIVE DISSIPATED ENERGY:4.50  INDICATIONS:see scaqnned office H&P for details  OP FINDINGS:moderately dense NS  COMPLICATIONS:None  DICTATION #: none  PLAN OF CARE: as above  PATIENT DISPOSITION:  Short stay

## 2015-07-23 ENCOUNTER — Encounter (HOSPITAL_COMMUNITY): Payer: Self-pay | Admitting: Ophthalmology

## 2015-07-26 ENCOUNTER — Ambulatory Visit (HOSPITAL_COMMUNITY)
Admission: RE | Admit: 2015-07-26 | Discharge: 2015-07-26 | Disposition: A | Discharge status: Home / Self Care | Payer: Medicare Other | Source: Ambulatory Visit | Attending: Internal Medicine | Admitting: Internal Medicine

## 2015-07-26 ENCOUNTER — Encounter (HOSPITAL_COMMUNITY): Payer: Self-pay | Admitting: Internal Medicine

## 2015-07-26 VITALS — BP 106/72 | HR 83 | Wt 177.5 lb

## 2015-07-26 DIAGNOSIS — I420 Dilated cardiomyopathy: Secondary | ICD-10-CM | POA: Diagnosis not present

## 2015-07-26 DIAGNOSIS — Z8249 Family history of ischemic heart disease and other diseases of the circulatory system: Secondary | ICD-10-CM | POA: Diagnosis not present

## 2015-07-26 DIAGNOSIS — Z95810 Presence of automatic (implantable) cardiac defibrillator: Secondary | ICD-10-CM | POA: Diagnosis not present

## 2015-07-26 DIAGNOSIS — Z79899 Other long term (current) drug therapy: Secondary | ICD-10-CM | POA: Insufficient documentation

## 2015-07-26 DIAGNOSIS — I493 Ventricular premature depolarization: Secondary | ICD-10-CM | POA: Insufficient documentation

## 2015-07-26 DIAGNOSIS — I509 Heart failure, unspecified: Secondary | ICD-10-CM | POA: Diagnosis present

## 2015-07-26 DIAGNOSIS — I5022 Chronic systolic (congestive) heart failure: Secondary | ICD-10-CM

## 2015-07-26 DIAGNOSIS — I447 Left bundle-branch block, unspecified: Secondary | ICD-10-CM | POA: Diagnosis not present

## 2015-07-26 DIAGNOSIS — K219 Gastro-esophageal reflux disease without esophagitis: Secondary | ICD-10-CM | POA: Insufficient documentation

## 2015-07-26 MED ORDER — FUROSEMIDE 20 MG PO TABS: 20.0000 mg | ORAL_TABLET | Freq: Every day | ORAL | Status: DC

## 2015-07-26 MED ORDER — LOSARTAN POTASSIUM 25 MG PO TABS: 12.5000 mg | ORAL_TABLET | Freq: Every day | ORAL | Status: DC

## 2015-07-26 MED ORDER — IVABRADINE HCL 5 MG PO TABS: 5.0000 mg | ORAL_TABLET | Freq: Two times a day (BID) | ORAL | Status: DC

## 2015-07-26 NOTE — Progress Notes (Signed)
Patient ID: [name], male   DOB: 12/15/1940, 75 y.o.   MRN: [id number]  ADVANCED HF CLINIC NOTE  Patient ID: [name], male   DOB: 1940-06-06, 75 y.o.   MRN: [id number] Referring Physician: Dr. Domenic Polite  Primary Care: Dr. Gar [facility])   HPI:  [name] is a pleasant 75 yo male with a h/o familial cardiomyopathy and LBBB who was referred to the clinic by Dr. Domenic Polite for a decrease in his EF.  In 2003 he had an echo with EF 45%. ECHO in 2007 showed EF 50-55%.   6 siblings 3 boys, 3 girls  2 brothers: 1 with transplant (had frequent VT), other with HF and ICD  1 sister with CHF and ICD.  Sister found to have LMNA mutation.  1 sister with ovarian cancer  1 sister OK  Father with CHF  Was doing well until July 2014 when he was outside and bent over to turn off spigot and got very dizzy and he fell over and caught himself before he hit his face on a rock. He denied LOC. Family said he was confused for a bit. No palpitations , CP or seizure activity. No further episodes. Had a repeat ECHO in 7/14 showed EF 25%, diffuse HK, mild MR, and mild TR. He has a family history of NICM (mother and brother) and his brother is s/p cardiac transplantation. He had a 24 holter monitor placed showing SR 41-112 with average 64 bpm, frequent PVCs with some NSVT. About 8% beats PVCs, also had some pauses ranging 2.0-2.8 seconds. He was then referred to HF clinic.  He has not been able to tolerate low doses of ACEI or beta blocker due to hypotension/orthostatic symptoms.  At last appointment, started him back on digoxin at low dose and spironolactone 12.5 mg daily.  He has been taking Lasix every other day.  He continues to have limiting right knee pain.  His CPX was submaximal because of this.  Family says that he "pants" with heavier exertion.  However, he still goes out to do yardwork.  He does get short of breath with yardwork.  He is short of breath after walking 300-400 feet on flat  ground or with walking up an incline.  Weight is down 5 lbs.    Seen in ER a few days ago with severe SOB. BNP was up. CXR with bilateral effusions.  Got 40 IV lasix and 1600cc urine out. Weighs everyday. Doesn't take lasix. Says BP is too low for lasix.   He was put on mexiletine by Dr Caryl Comes for frequent PVCs with the thought that these were contributing to his cardiomyopathy.  However, he stopped it because it made him feel bad.  He does not feel palpitations.   Had knee injection with Dr. Ninfa Linden in February 2017 and felt better.   Today he returns HF follow up. Feels worse today. Over past few days more SOB and increasing edema. Weight up 2-3 pounds up at home. Up 8 pounds here. No orthopnea or PND. No CP. Digoxin stopped several weeks ago by PCP. No other changes. Not taking lasix every day.   Echo t4/3/17  EF 20-25%% RV moderately reduced.   ICD interrogated personally. No VT/AF. Activity level coming back up now 2 hours/day. Fluid level ok.    R/LHC 11/02/2012 RA = 5  RV = 41/4/8  PA = 39/11 (27)  PCW = 21 v = 35  Fick cardiac output/index = 3.6/1.7  Thermo CO/CI =  3.2/1.5  PVR = 1.6 [place]  SVR = 2075  FA sat = 96%  PA sat = 57%, 60%  Ao Pressure: 115/69 (88)  LV Pressure: 118/18/22  There was no signficant gradient across the aortic valve on pullback.  Left main: Normal  LAD: Large vessel gives off 1 diagonal. 20-30% proximal lesion. Otherwise normal  LCX: Small ramus. Tiny OM-1. Moderate-sized OM-2. Normal  RCA: Dominant. Normal.  LV-gram done in the RAO projection: Ejection fraction = 15% severe global HK. I do not see clear MR jet  Assessment:  1. Minimal CAD  2. Severe LV dysfunction with low output physiology  3. Significant v-waves in PCWP tracing suggesting of MR    CPX 3/17  FVC 3.37 (73%)    FEV1 2.62 (77%)     FEV1/FVC 78 (103%)     MVV 126 (94%)  Resting HR: 77 Peak HR: 129  (88% age predicted max HR) BP rest: 100/62 BP peak: 130/68 Peak  VO2: 16.8 (64% predicted peak VO2) VE/VCO2 slope: 43 OUES: 1.93 Peak RER: 0.95 VE/MVV: 48% PETCO2 at peak: 23 O2pulse: 10  (71% predicted O2pulse)  CPX (11/16) Peak VO2 14.8, VE/VCO2 slope 38.9, RER 0.96.  Submaximal due to knee pain. Moderate restrictive lung pattern, probably at least moderate HF limitation but not conclusive as submaximal.   CPX 2/15 Peak VO2 21.7 VE/VCO2 slope 29 RER 1.12 Low normal functional capacity.   Echo (11/16) EF 20-25%, restrictive diastolic function, RV normal size/systolic function, mild MR, PA systolic pressure 47 mmHg.      S/P Medtronic CRT-D 12/14/12   Labs (8/14): K 4.8, creatinine 0.9          (9/14): K 4.2, creatinine 0.91  (12/07/12)  K 4.8 Creatinine 0.9             (03/29/13 ) K 4.6 Creatinine 0.9 Pro BNP 895          (12/15): K 4.5, creatinine 1.0, BNP 895           (05/13/2015): K 4.0 Creatinine 1.04  BNP 1235   SH: Lives with his wife in [place].  Nonsmoker.   FH: 6 siblings 3 boys 3 girls         2 brothers: 1 with transplant (had frequent VT), other with HF and ICD         1 sister with CHF and ICD => LMNA cardiomyopathy diagnosed by genetic testing.         1 sister with ovarian cancer         1 sister OK          Father with CHF  ROS: All systems negative except as listed in HPI, PMH and Problem List.  Past Medical History  Diagnosis Date  . Nephrolithiasis   . GERD (gastroesophageal reflux disease)   . Insomnia   . Nonischemic cardiomyopathy ([place])     Cardiac catheterization 12/2011 with LVEF 45%, no significant obstructive CAD  . Automatic implantable cardioverter-defibrillator in situ   . H/O hiatal hernia   . Osteoarthritis     "knees" (12/14/2012)  . Pneumonia     Dec 2016  . CHF (congestive heart failure) ([place])     Current Outpatient Prescriptions  Medication Sig Dispense Refill  . Calcium Carbonate-Vitamin D (CALTRATE 600+D PO) Take 1 tablet by mouth daily.    . Carboxymethylcellulose Sodium (THERATEARS  OP) Apply 1 drop to eye 3 (three) times daily as needed (dry eyes).     Marland Kitchen  Cod Liver Oil 1000 MG CAPS Take 1 capsule by mouth daily.    Marland Kitchen doxycycline (VIBRA-TABS) 100 MG tablet Take 100 mg by mouth 2 (two) times daily.  0  . furosemide (LASIX) 40 MG tablet Take 0.5 tablets (20 mg total) by mouth as needed. (Patient taking differently: Take 20 mg by mouth daily. ) 270 tablet 3  . gatifloxacin (ZYMAXID) 0.5 % SOLN Place 1 drop into the left eye 2 (two) times daily.     . Glucosamine Sulfate 1000 MG CAPS Take 1,000 mg by mouth daily.    Marland Kitchen ketorolac (ACULAR) 0.5 % ophthalmic solution Place 1 drop into the left eye 2 (two) times daily.     . Multiple Vitamin (MULTIVITAMIN) tablet Take 1 tablet by mouth daily.    . pantoprazole (PROTONIX) 40 MG tablet Take 40 mg by mouth daily.    . potassium chloride SA (KLOR-CON M20) 20 MEQ tablet Take 1 tablet (20 mEq total) by mouth 3 (three) times a week. [place] (Patient taking differently: Take 10 mEq by mouth daily. MONDAY WEDNESDAY FRIDAY) 15 tablet 3  . prednisoLONE acetate (PRED FORTE) 1 % ophthalmic suspension Place 1 drop into the left eye 4 (four) times daily.    . Psyllium (METAMUCIL PO) Take 1 tablet by mouth daily.    Marland Kitchen spironolactone (ALDACTONE) 25 MG tablet Take 1 tablet (25 mg total) by mouth daily. 90 tablet 3  . vitamin A 8000 UNIT capsule Take 8,000 Units by mouth 3 (three) times a week.     . vitamin B-12 (CYANOCOBALAMIN) 100 MCG tablet Take 100 mcg by mouth daily.    . vitamin C (ASCORBIC ACID) 500 MG tablet Take 500 mg by mouth every other day.     . digoxin (LANOXIN) 0.125 MG tablet Take 0.5 tablets (0.0625 mg total) by mouth daily. (Patient not taking: Reported on 07/26/2015) 90 tablet 3   No current facility-administered medications for this encounter.    Filed Vitals:   07/26/15 1212  BP: 106/72  Pulse: 83  Weight: 177 lb 8 oz (80.513 kg)  SpO2: 99%    Physical Exam: General: Fatigued appearing. Family present     HEENT: normal  Neck: supple. JVP 7  Carotids 2+ bilat; no bruits. No lymphadenopathy or thryomegaly appreciated.  Cor: PMI laterally displaced. Regular rate & rhythm. No murmur.  No S3/S4.  Lungs: clear Abdomen: soft, nontender, nondistended. No hepatosplenomegaly. No bruits or masses. Good bowel sounds.  Extremities: no cyanosis, clubbing, rash, 1-2+ edema  Neuro: alert & oriented x 3, cranial nerves grossly intact. moves all 4 extremities w/o difficulty. Affect pleasant.  ASSESSMENT & PLAN:  1) Chronic systolic HF:  NYHA III, nonischemic cardiomyopathy.  Sister with LMNA cardiomyopathy.  He has a dilated cardiomyopathy with LBBB, which would fit LMNA cardiomyopathy as well.  EF 20-25% on 11/16 echo.  Has Medtronic CRT-D device.  CPX was submaximal due to knee pain but indicates at least moderate HF limitation.   -He is worse today. NYHA III with volume overload - Will give lasix 40 mg today and have asked him to tak '20mg'$  every day. Can double up if weight going u - He has been unable to take even low doses of beta blockers or ACEIs due to dizziness/orthostasis.  - Digoxin recently stopped by PCP  - Will add corlanor 5 bid today and then in 1 week try to start losartan 12.'5mg'$  qhs  - Continue spiro 25 daily. Use lasix prn  - Repeat  CPX in 3-6 months - He has met with LVAD coordinator today and received information on the VAD.  2) Familial cardiomyopathy: Sister with LMNA cardiomyopathy diagnosed by genetic testing.  Suspect that he has it also, would fit dilated cardiomyopathy with conduction system disease (LBBB).   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  He was not able to tolerate mexiletine.  He has not been able to tolerate beta blockers.  NSVT noted on device interrogation. He follows with Dr. Caryl Comes Will need to requantify PVC burden soon.    Glori Bickers MD  07/26/2015

## 2015-07-26 NOTE — Patient Instructions (Signed)
START Corlanor 5 mg, one tab twice a day CHANGE Lasix to 20 mg, one tab daily START Losartan 12.5 mg, one half tab at bedtime ( wait one week after starting corlanor then begin)  Your physician recommends that you schedule a follow-up appointment in: 2-3 weeks with Dr.Bensimhon  Do the following things EVERYDAY: 1) Weigh yourself in the morning before breakfast. Write it down and keep it in a log. 2) Take your medicines as prescribed 3) Eat low salt foods-Limit salt (sodium) to 2000 mg per day.  4) Stay as active as you can everyday 5) Limit all fluids for the day to less than 2 liters 6)

## 2015-08-01 ENCOUNTER — Telehealth (HOSPITAL_COMMUNITY): Payer: Self-pay | Admitting: Pharmacist

## 2015-08-01 ENCOUNTER — Other Ambulatory Visit (HOSPITAL_COMMUNITY): Payer: Self-pay | Admitting: *Deleted

## 2015-08-01 MED ORDER — LOSARTAN POTASSIUM 25 MG PO TABS: 12.5000 mg | ORAL_TABLET | Freq: Every day | ORAL | Status: DC

## 2015-08-01 NOTE — Telephone Encounter (Signed)
Corlanor 5 mg BID PA approved by [facility] Medicare through 07/31/17.   Tyler Deis. Bonnye Fava, PharmD, BCPS, CPP Clinical Pharmacist Pager: [phone] Phone: [phone] 08/01/2015 11:15 AM

## 2015-08-16 ENCOUNTER — Telehealth: Payer: Self-pay | Admitting: Internal Medicine

## 2015-08-16 MED ORDER — LOSARTAN POTASSIUM 25 MG PO TABS: 12.5000 mg | ORAL_TABLET | Freq: Every day | ORAL | Status: DC

## 2015-08-16 NOTE — Telephone Encounter (Signed)
New Message  Pt called request a call back to discuss who the pt should see. Is the patient a patient of Dr. Veryl Speak office or is the patient a patient of Dr. Graciela Husbands. Request clarification   Pt c/o swelling: STAT is pt has developed SOB within 24 hours  1. How long have you been experiencing swelling? 2 weeks   2. Where is the swelling located? His feet   3.  Are you currently taking a "fluid pill"? Yes   4.  Are you currently SOB? Yes and no. He did while at breakfast  5.  Have you traveled recently? YES  Comments: Pt wife believes that he is having panic attacks. When he has swelling he begins to have fluid and he goes into a panic attack that he exaggerates the severity of the swelling. Pt wife states that after he calms down he is ok.    Pt wife says that the fluid pill was increased but the patient has sodium intake so she is not sure that the lasix works properly.

## 2015-08-16 NOTE — Telephone Encounter (Signed)
Received triage call from [facility] pertaining to mutual patient and wife worrying about patient's anxiety secondary to CHF and correlated symptoms. Spent greater than 30 minutes on the phone with patient's wife discussing how patient has what she believes are panic attacks almost daily because he is afraid his fluid is "taking over and if he doesn't take extra fluid pills and digoxin then he will drown". Wife explained that her father also had CHF and "drowned to death in his own fluids after a long battle", and she believes husband thinks this is going to happen to him. Wife explains that husband never props up feet and at the end of the day they are slightly swollen.  Patient weighs multiple times through the day and at the end of the day he usually gains 2-3 lbs and worries that he is severely fluid overloaded, begins "panting rapidly and saying he can't catch his breath because of all of the fluid"  and at this time he must take extra pills (spiro, lasix, digoxin).  Wife also explains that the instant he swallows these pills he feels better and all symptoms subside. Advised that this certainly sounds psych/anxiety related, and stressed the severe importance of NOT taking any extra dig tablets.  Also advised to hold of on any extra fluid pills until seen in CHF clinic this Wednesday.  Reiterated that these pills are closely monitored by lab work and that any incorrect doses or self medicating of these medications without close monitoring of labs and/or vitals could result in near-fatal/fatal adversities. Also advised wife that if he has one of these attacks that don't subside or worsen to go to ED to be treated or call CHF triage line INSTEAD of giving him extra tablets. Wife aware and agreeable to plan as stated above. Will forward to Dr. Gala Romney to make him aware of this issue before he sees patient on Wednesday.  Ave Filter

## 2015-08-21 ENCOUNTER — Encounter (HOSPITAL_COMMUNITY): Payer: Self-pay | Admitting: Internal Medicine

## 2015-08-21 ENCOUNTER — Encounter (HOSPITAL_COMMUNITY): Payer: Self-pay

## 2015-08-21 ENCOUNTER — Inpatient Hospital Stay (HOSPITAL_COMMUNITY): Payer: Medicare Other

## 2015-08-21 ENCOUNTER — Ambulatory Visit (HOSPITAL_COMMUNITY)
Admission: RE | Admit: 2015-08-21 | Discharge: 2015-08-21 | Disposition: A | Discharge status: Home / Self Care | Payer: Medicare Other | Source: Ambulatory Visit | Attending: Internal Medicine | Admitting: Internal Medicine

## 2015-08-21 ENCOUNTER — Inpatient Hospital Stay (HOSPITAL_COMMUNITY)
Admission: AD | Admit: 2015-08-21 | Discharge: 2015-09-19 | DRG: 001 | Disposition: A | Discharge status: Other Acute Inpatient Hospital | Payer: Medicare Other | Source: Ambulatory Visit | Attending: Cardiothoracic Surgery | Admitting: Cardiothoracic Surgery

## 2015-08-21 VITALS — BP 102/62 | HR 65 | Wt 177.0 lb

## 2015-08-21 DIAGNOSIS — Z79899 Other long term (current) drug therapy: Secondary | ICD-10-CM | POA: Diagnosis not present

## 2015-08-21 DIAGNOSIS — E43 Unspecified severe protein-calorie malnutrition: Secondary | ICD-10-CM | POA: Diagnosis present

## 2015-08-21 DIAGNOSIS — I509 Heart failure, unspecified: Secondary | ICD-10-CM | POA: Insufficient documentation

## 2015-08-21 DIAGNOSIS — I071 Rheumatic tricuspid insufficiency: Secondary | ICD-10-CM | POA: Diagnosis present

## 2015-08-21 DIAGNOSIS — Z01810 Encounter for preprocedural cardiovascular examination: Secondary | ICD-10-CM | POA: Diagnosis not present

## 2015-08-21 DIAGNOSIS — R410 Disorientation, unspecified: Secondary | ICD-10-CM | POA: Diagnosis not present

## 2015-08-21 DIAGNOSIS — I110 Hypertensive heart disease with heart failure: Secondary | ICD-10-CM | POA: Diagnosis present

## 2015-08-21 DIAGNOSIS — Z515 Encounter for palliative care: Secondary | ICD-10-CM | POA: Diagnosis not present

## 2015-08-21 DIAGNOSIS — I447 Left bundle-branch block, unspecified: Secondary | ICD-10-CM | POA: Diagnosis present

## 2015-08-21 DIAGNOSIS — Z9889 Other specified postprocedural states: Secondary | ICD-10-CM

## 2015-08-21 DIAGNOSIS — Z9081 Acquired absence of spleen: Secondary | ICD-10-CM | POA: Diagnosis not present

## 2015-08-21 DIAGNOSIS — J90 Pleural effusion, not elsewhere classified: Secondary | ICD-10-CM | POA: Diagnosis not present

## 2015-08-21 DIAGNOSIS — I951 Orthostatic hypotension: Secondary | ICD-10-CM | POA: Diagnosis present

## 2015-08-21 DIAGNOSIS — I272 Other secondary pulmonary hypertension: Secondary | ICD-10-CM | POA: Diagnosis present

## 2015-08-21 DIAGNOSIS — I4891 Unspecified atrial fibrillation: Secondary | ICD-10-CM | POA: Diagnosis present

## 2015-08-21 DIAGNOSIS — Z6820 Body mass index (BMI) 20.0-20.9, adult: Secondary | ICD-10-CM

## 2015-08-21 DIAGNOSIS — R570 Cardiogenic shock: Secondary | ICD-10-CM | POA: Diagnosis not present

## 2015-08-21 DIAGNOSIS — Z8249 Family history of ischemic heart disease and other diseases of the circulatory system: Secondary | ICD-10-CM

## 2015-08-21 DIAGNOSIS — J948 Other specified pleural conditions: Secondary | ICD-10-CM | POA: Diagnosis not present

## 2015-08-21 DIAGNOSIS — R7303 Prediabetes: Secondary | ICD-10-CM | POA: Insufficient documentation

## 2015-08-21 DIAGNOSIS — I5023 Acute on chronic systolic (congestive) heart failure: Secondary | ICD-10-CM

## 2015-08-21 DIAGNOSIS — D62 Acute posthemorrhagic anemia: Secondary | ICD-10-CM | POA: Diagnosis not present

## 2015-08-21 DIAGNOSIS — K219 Gastro-esophageal reflux disease without esophagitis: Secondary | ICD-10-CM | POA: Diagnosis present

## 2015-08-21 DIAGNOSIS — R339 Retention of urine, unspecified: Secondary | ICD-10-CM | POA: Diagnosis not present

## 2015-08-21 DIAGNOSIS — F17220 Nicotine dependence, chewing tobacco, uncomplicated: Secondary | ICD-10-CM | POA: Diagnosis present

## 2015-08-21 DIAGNOSIS — G4700 Insomnia, unspecified: Secondary | ICD-10-CM | POA: Diagnosis present

## 2015-08-21 DIAGNOSIS — Z885 Allergy status to narcotic agent status: Secondary | ICD-10-CM

## 2015-08-21 DIAGNOSIS — I313 Pericardial effusion (noninflammatory): Secondary | ICD-10-CM | POA: Diagnosis present

## 2015-08-21 DIAGNOSIS — I420 Dilated cardiomyopathy: Secondary | ICD-10-CM | POA: Diagnosis present

## 2015-08-21 DIAGNOSIS — K5900 Constipation, unspecified: Secondary | ICD-10-CM | POA: Diagnosis present

## 2015-08-21 DIAGNOSIS — N401 Enlarged prostate with lower urinary tract symptoms: Secondary | ICD-10-CM | POA: Diagnosis present

## 2015-08-21 DIAGNOSIS — R5381 Other malaise: Secondary | ICD-10-CM | POA: Diagnosis not present

## 2015-08-21 DIAGNOSIS — M1990 Unspecified osteoarthritis, unspecified site: Secondary | ICD-10-CM | POA: Diagnosis present

## 2015-08-21 DIAGNOSIS — Z931 Gastrostomy status: Secondary | ICD-10-CM

## 2015-08-21 DIAGNOSIS — I472 Ventricular tachycardia: Secondary | ICD-10-CM | POA: Diagnosis not present

## 2015-08-21 DIAGNOSIS — G931 Anoxic brain damage, not elsewhere classified: Secondary | ICD-10-CM | POA: Diagnosis not present

## 2015-08-21 DIAGNOSIS — M159 Polyosteoarthritis, unspecified: Secondary | ICD-10-CM | POA: Insufficient documentation

## 2015-08-21 DIAGNOSIS — Z4659 Encounter for fitting and adjustment of other gastrointestinal appliance and device: Secondary | ICD-10-CM

## 2015-08-21 DIAGNOSIS — Z95811 Presence of heart assist device: Secondary | ICD-10-CM | POA: Diagnosis not present

## 2015-08-21 DIAGNOSIS — Z888 Allergy status to other drugs, medicaments and biological substances status: Secondary | ICD-10-CM

## 2015-08-21 DIAGNOSIS — Z95810 Presence of automatic (implantable) cardiac defibrillator: Secondary | ICD-10-CM | POA: Diagnosis not present

## 2015-08-21 DIAGNOSIS — Z7189 Other specified counseling: Secondary | ICD-10-CM | POA: Diagnosis not present

## 2015-08-21 DIAGNOSIS — I5022 Chronic systolic (congestive) heart failure: Secondary | ICD-10-CM | POA: Diagnosis not present

## 2015-08-21 DIAGNOSIS — E876 Hypokalemia: Secondary | ICD-10-CM | POA: Diagnosis not present

## 2015-08-21 DIAGNOSIS — G9340 Encephalopathy, unspecified: Secondary | ICD-10-CM | POA: Insufficient documentation

## 2015-08-21 DIAGNOSIS — I493 Ventricular premature depolarization: Secondary | ICD-10-CM | POA: Diagnosis not present

## 2015-08-21 DIAGNOSIS — F05 Delirium due to known physiological condition: Secondary | ICD-10-CM | POA: Diagnosis not present

## 2015-08-21 DIAGNOSIS — K5901 Slow transit constipation: Secondary | ICD-10-CM | POA: Diagnosis not present

## 2015-08-21 DIAGNOSIS — E869 Volume depletion, unspecified: Secondary | ICD-10-CM | POA: Diagnosis not present

## 2015-08-21 DIAGNOSIS — R338 Other retention of urine: Secondary | ICD-10-CM | POA: Diagnosis present

## 2015-08-21 DIAGNOSIS — Z98811 Dental restoration status: Secondary | ICD-10-CM | POA: Diagnosis not present

## 2015-08-21 DIAGNOSIS — Z01818 Encounter for other preprocedural examination: Secondary | ICD-10-CM

## 2015-08-21 DIAGNOSIS — F0390 Unspecified dementia without behavioral disturbance: Secondary | ICD-10-CM | POA: Diagnosis not present

## 2015-08-21 DIAGNOSIS — I2589 Other forms of chronic ischemic heart disease: Secondary | ICD-10-CM | POA: Diagnosis not present

## 2015-08-21 DIAGNOSIS — R0602 Shortness of breath: Secondary | ICD-10-CM

## 2015-08-21 LAB — CBC WITH DIFFERENTIAL/PLATELET
BASOS ABS: 0 10*3/uL (ref 0.0–0.1)
Basophils Relative: 0 %
Eosinophils Absolute: 0.1 10*3/uL (ref 0.0–0.7)
Eosinophils Relative: 1 %
HEMATOCRIT: 41.1 % (ref 39.0–52.0)
HEMOGLOBIN: 12.7 g/dL — AB (ref 13.0–17.0)
LYMPHS PCT: 20 %
Lymphs Abs: 1.1 10*3/uL (ref 0.7–4.0)
MCH: 26.3 pg (ref 26.0–34.0)
MCHC: 30.9 g/dL (ref 30.0–36.0)
MCV: 85.3 fL (ref 78.0–100.0)
MONO ABS: 0.4 10*3/uL (ref 0.1–1.0)
MONOS PCT: 8 %
NEUTROS ABS: 4.2 10*3/uL (ref 1.7–7.7)
NEUTROS PCT: 71 %
Platelets: 124 10*3/uL — ABNORMAL LOW (ref 150–400)
RBC: 4.82 MIL/uL (ref 4.22–5.81)
RDW: 15.4 % (ref 11.5–15.5)
WBC: 5.9 10*3/uL (ref 4.0–10.5)

## 2015-08-21 LAB — COMPREHENSIVE METABOLIC PANEL
ALT: 36 U/L (ref 17–63)
AST: 37 U/L (ref 15–41)
Albumin: 3.6 g/dL (ref 3.5–5.0)
Alkaline Phosphatase: 43 U/L (ref 38–126)
Anion gap: 8 (ref 5–15)
BILIRUBIN TOTAL: 1.7 mg/dL — AB (ref 0.3–1.2)
BUN: 11 mg/dL (ref 6–20)
CHLORIDE: 107 mmol/L (ref 101–111)
CO2: 22 mmol/L (ref 22–32)
Calcium: 9.4 mg/dL (ref 8.9–10.3)
Creatinine, Ser: 1.17 mg/dL (ref 0.61–1.24)
GFR calc Af Amer: >60 mL/min (ref >60)
GLUCOSE: 117 mg/dL — AB (ref 65–99)
Potassium: 4.5 mmol/L (ref 3.5–5.1)
Sodium: 137 mmol/L (ref 135–145)
TOTAL PROTEIN: 6.4 g/dL — AB (ref 6.5–8.1)

## 2015-08-21 LAB — LIPID PANEL
CHOLESTEROL: 101 mg/dL (ref 0–200)
HDL: 28 mg/dL — ABNORMAL LOW (ref >40)
LDL Cholesterol: 64 mg/dL (ref 0–99)
TRIGLYCERIDES: 44 mg/dL (ref <150)
Total CHOL/HDL Ratio: 3.6 RATIO
VLDL: 9 mg/dL (ref 0–40)

## 2015-08-21 LAB — SURGICAL PCR SCREEN
MRSA, PCR: NEGATIVE
STAPHYLOCOCCUS AUREUS: NEGATIVE

## 2015-08-21 LAB — PROTIME-INR
INR: 1.38 (ref 0.00–1.49)
Prothrombin Time: 17.1 seconds — ABNORMAL HIGH (ref 11.6–15.2)

## 2015-08-21 LAB — BRAIN NATRIURETIC PEPTIDE: B Natriuretic Peptide: 2132.9 pg/mL — ABNORMAL HIGH (ref 0.0–100.0)

## 2015-08-21 LAB — MAGNESIUM: Magnesium: 2.1 mg/dL (ref 1.7–2.4)

## 2015-08-21 LAB — TSH: TSH: 1.395 u[IU]/mL (ref 0.350–4.500)

## 2015-08-21 MED ORDER — SPIRONOLACTONE 25 MG PO TABS
25.0000 mg | ORAL_TABLET | Freq: Every day | ORAL | Status: DC
  Administered 2015-08-23 – 2015-08-24 (×2): 25 mg via ORAL
  Filled 2015-08-21 (×2): qty 1

## 2015-08-21 MED ORDER — SODIUM CHLORIDE 0.9% FLUSH
10.0000 mL | Freq: Two times a day (BID) | INTRAVENOUS | Status: DC
  Administered 2015-08-21: 10 mL
  Administered 2015-08-22: 30 mL
  Administered 2015-08-23: 10 mL
  Administered 2015-08-23: 20 mL
  Administered 2015-08-24 – 2015-08-27 (×4): 10 mL

## 2015-08-21 MED ORDER — SODIUM CHLORIDE 0.9 % IV SOLN
250.0000 mL | INTRAVENOUS | Status: DC | PRN
  Administered 2015-08-25: 250 mL via INTRAVENOUS

## 2015-08-21 MED ORDER — CARBOXYMETHYLCELLULOSE SODIUM 0.25 % OP SOLN: Freq: Three times a day (TID) | OPHTHALMIC | Status: DC | PRN

## 2015-08-21 MED ORDER — ESCITALOPRAM OXALATE 10 MG PO TABS
10.0000 mg | ORAL_TABLET | Freq: Every day | ORAL | Status: DC
  Administered 2015-08-23 – 2015-08-29 (×7): 10 mg via ORAL
  Filled 2015-08-21 (×7): qty 1

## 2015-08-21 MED ORDER — GATIFLOXACIN 0.5 % OP SOLN
1.0000 [drp] | Freq: Two times a day (BID) | OPHTHALMIC | Status: DC
  Filled 2015-08-21: qty 2.5

## 2015-08-21 MED ORDER — VITAMIN A 8000 UNITS PO CAPS: 8000.0000 [IU] | ORAL_CAPSULE | ORAL | Status: DC

## 2015-08-21 MED ORDER — KETOROLAC TROMETHAMINE 0.5 % OP SOLN
1.0000 [drp] | Freq: Two times a day (BID) | OPHTHALMIC | Status: DC
  Administered 2015-08-22: 1 [drp] via OPHTHALMIC
  Filled 2015-08-21: qty 3

## 2015-08-21 MED ORDER — LOSARTAN POTASSIUM 25 MG PO TABS
12.5000 mg | ORAL_TABLET | Freq: Every day | ORAL | Status: DC
  Administered 2015-08-23 – 2015-08-24 (×2): 12.5 mg via ORAL
  Filled 2015-08-21 (×2): qty 1

## 2015-08-21 MED ORDER — VITAMIN C 500 MG PO TABS
500.0000 mg | ORAL_TABLET | ORAL | Status: DC
  Administered 2015-08-21 – 2015-08-29 (×5): 500 mg via ORAL
  Filled 2015-08-21 (×5): qty 1

## 2015-08-21 MED ORDER — ACETAMINOPHEN 325 MG PO TABS
650.0000 mg | ORAL_TABLET | ORAL | Status: DC | PRN
Start: 2015-08-21 — End: 2015-08-30
  Administered 2015-08-23 – 2015-08-28 (×4): 650 mg via ORAL
  Filled 2015-08-21 (×5): qty 2

## 2015-08-21 MED ORDER — ADULT MULTIVITAMIN W/MINERALS CH
1.0000 | ORAL_TABLET | Freq: Every day | ORAL | Status: DC
  Administered 2015-08-21 – 2015-08-29 (×8): 1 via ORAL
  Filled 2015-08-21 (×8): qty 1

## 2015-08-21 MED ORDER — VITAMIN B-12 100 MCG PO TABS
100.0000 ug | ORAL_TABLET | Freq: Every day | ORAL | Status: DC
  Administered 2015-08-21 – 2015-08-29 (×8): 100 ug via ORAL
  Filled 2015-08-21 (×10): qty 1

## 2015-08-21 MED ORDER — ENOXAPARIN SODIUM 40 MG/0.4ML ~~LOC~~ SOLN
40.0000 mg | SUBCUTANEOUS | Status: DC
  Administered 2015-08-21: 40 mg via SUBCUTANEOUS
  Filled 2015-08-21: qty 0.4

## 2015-08-21 MED ORDER — IVABRADINE HCL 5 MG PO TABS
5.0000 mg | ORAL_TABLET | Freq: Two times a day (BID) | ORAL | Status: DC
Start: 2015-08-21 — End: 2015-08-24
  Administered 2015-08-21 – 2015-08-24 (×6): 5 mg via ORAL
  Filled 2015-08-21 (×6): qty 1

## 2015-08-21 MED ORDER — CALCIUM CARBONATE-VITAMIN D 500-200 MG-UNIT PO TABS
ORAL_TABLET | Freq: Every day | ORAL | Status: DC
  Administered 2015-08-21 – 2015-08-29 (×8): 1 via ORAL
  Filled 2015-08-21 (×9): qty 1

## 2015-08-21 MED ORDER — SODIUM CHLORIDE 0.9% FLUSH: 10.0000 mL | INTRAVENOUS | Status: DC | PRN

## 2015-08-21 MED ORDER — SODIUM CHLORIDE 0.9% FLUSH
3.0000 mL | Freq: Two times a day (BID) | INTRAVENOUS | Status: DC
  Administered 2015-08-21 – 2015-08-27 (×7): 3 mL via INTRAVENOUS

## 2015-08-21 MED ORDER — PSYLLIUM 95 % PO PACK
1.0000 | PACK | Freq: Every day | ORAL | Status: DC | PRN
  Filled 2015-08-21: qty 1

## 2015-08-21 MED ORDER — POTASSIUM CHLORIDE CRYS ER 20 MEQ PO TBCR
20.0000 meq | EXTENDED_RELEASE_TABLET | Freq: Every day | ORAL | Status: DC
  Administered 2015-08-21: 20 meq via ORAL
  Filled 2015-08-21: qty 1

## 2015-08-21 MED ORDER — PANTOPRAZOLE SODIUM 40 MG PO TBEC
40.0000 mg | DELAYED_RELEASE_TABLET | Freq: Every day | ORAL | Status: DC
  Administered 2015-08-23 – 2015-08-29 (×7): 40 mg via ORAL
  Filled 2015-08-21 (×8): qty 1

## 2015-08-21 MED ORDER — ONDANSETRON HCL 4 MG/2ML IJ SOLN: 4.0000 mg | Freq: Four times a day (QID) | INTRAMUSCULAR | Status: DC | PRN

## 2015-08-21 MED ORDER — PREDNISOLONE ACETATE 1 % OP SUSP
1.0000 [drp] | Freq: Four times a day (QID) | OPHTHALMIC | Status: DC
  Administered 2015-08-21 – 2015-08-22 (×2): 1 [drp] via OPHTHALMIC
  Filled 2015-08-21: qty 1

## 2015-08-21 MED ORDER — POLYVINYL ALCOHOL 1.4 % OP SOLN
1.0000 [drp] | OPHTHALMIC | Status: DC | PRN
  Administered 2015-08-23 – 2015-08-27 (×2): 1 [drp] via OPHTHALMIC
  Filled 2015-08-21: qty 15

## 2015-08-21 MED ORDER — GLUCOSAMINE SULFATE 1000 MG PO CAPS: 1000.0000 mg | ORAL_CAPSULE | Freq: Every day | ORAL | Status: DC

## 2015-08-21 MED ORDER — FUROSEMIDE 10 MG/ML IJ SOLN
80.0000 mg | Freq: Two times a day (BID) | INTRAMUSCULAR | Status: DC
  Administered 2015-08-21 – 2015-08-22 (×3): 80 mg via INTRAVENOUS
  Filled 2015-08-21 (×3): qty 8

## 2015-08-21 MED ORDER — SODIUM CHLORIDE 0.9% FLUSH: 3.0000 mL | INTRAVENOUS | Status: DC | PRN

## 2015-08-21 NOTE — Progress Notes (Signed)
Peripherally Inserted Central Catheter/Midline Placement  The IV Nurse has discussed with the patient and/or persons authorized to consent for the patient, the purpose of this procedure and the potential benefits and risks involved with this procedure.  The benefits include less needle sticks, lab draws from the catheter and patient may be discharged home with the catheter.  Risks include, but not limited to, infection, bleeding, blood clot (thrombus formation), and puncture of an artery; nerve damage and irregular heat beat.  Alternatives to this procedure were also discussed.  PICC/Midline Placement Documentation  PICC Double Lumen 08/21/15 PICC Right Brachial 43 cm 2 cm (Active)  Indication for Insertion or Continuance of Line Chronic illness with exacerbations (CF, Sickle Cell, etc.) 08/21/2015  8:27 PM  Exposed Catheter (cm) 2 cm 08/21/2015  8:27 PM  Dressing Change Due 08/28/15 08/21/2015  8:27 PM       Jolayne Branson, Lajean Manes 08/21/2015, 8:27 PM

## 2015-08-21 NOTE — Progress Notes (Signed)
Patient ID: [name], male   DOB: 1940-05-26, 75 y.o.   MRN: [id number] Patient ID: [name], male   DOB: Jul 16, 1940, 75 y.o.   MRN: [id number]  ADVANCED HF CLINIC NOTE  Patient ID: [name], male   DOB: 06/11/1940, 76 y.o.   MRN: [id number] Referring Physician: Dr. Diona Browner  Primary Care: Dr. Donzetta Sprung [facility])   HPI:  [name] is a pleasant 75 yo male with a h/o familial cardiomyopathy and LBBB who was referred to the clinic by Dr. Diona Browner for a decrease in his EF.  In 2003 he had an echo with EF 45%. ECHO in 2007 showed EF 50-55%.   6 siblings 3 boys, 3 girls  2 brothers: 1 with transplant (had frequent VT), other with HF and ICD  1 sister with CHF and ICD.  Sister found to have LMNA mutation.  1 sister with ovarian cancer  1 sister OK  Father with CHF  Was doing well until July 2014 when he was outside and bent over to turn off spigot and got very dizzy and he fell over and caught himself before he hit his face on a rock. He denied LOC. Family said he was confused for a bit. No palpitations , CP or seizure activity. No further episodes. Had a repeat ECHO in 7/14 showed EF 25%, diffuse HK, mild MR, and mild TR. He has a family history of NICM (mother and brother) and his brother is s/p cardiac transplantation. He had a 24 holter monitor placed showing SR 41-112 with average 64 bpm, frequent PVCs with some NSVT. About 8% beats PVCs, also had some pauses ranging 2.0-2.8 seconds. He was then referred to HF clinic.  He has not been able to tolerate low doses of ACEI or beta blocker due to hypotension/orthostatic symptoms.  At last appointment, started him back on digoxin at low dose and spironolactone 12.5 mg daily.  He has been taking Lasix every other day.  He continues to have limiting right knee pain.  His CPX was submaximal because of this.  Family says that he "pants" with heavier exertion.  However, he still goes out to do yardwork.  He does get short of  breath with yardwork.  He is short of breath after walking 300-400 feet on flat ground or with walking up an incline.  Weight is down 5 lbs.    Seen in ER a few days ago with severe SOB. BNP was up. CXR with bilateral effusions.  Got 40 IV lasix and 1600cc urine out. Weighs everyday. Doesn't take lasix. Says BP is too low for lasix.   He was put on mexiletine by Dr Graciela Husbands for frequent PVCs with the thought that these were contributing to his cardiomyopathy.  However, he stopped it because it made him feel bad.  He does not feel palpitations.   Had knee injection with Dr. Magnus Ivan in February 2017 and felt better.   Today he returns HF follow up. At last visit he was fluid overloaded. Gave him lasix 40 x1 then started lasix 20 daily. Weight unchanged.. Some days lasix works and some days it doesn't. SOB with any activity. Feels miserable. Poor appetite. No orthopnea or PND. Digoxin stopped several weeks ago by PCP.   Echo 06/10/15  EF 20-25%% RV moderately reduced.   ICD interrogated personally. No VT/AF. Activity level coming back up now 2 hours/day. Fluid level ok.    R/LHC 11/02/2012 RA = 5  RV = 41/4/8  PA =  39/11 (27)  PCW = 21 v = 35  Fick cardiac output/index = 3.6/1.7  Thermo CO/CI = 3.2/1.5  PVR = 1.6 Woods  SVR = 2075  FA sat = 96%  PA sat = 57%, 60%  Ao Pressure: 115/69 (88)  LV Pressure: 118/18/22  There was no signficant gradient across the aortic valve on pullback.  Left main: Normal  LAD: Large vessel gives off 1 diagonal. 20-30% proximal lesion. Otherwise normal  LCX: Small ramus. Tiny OM-1. Moderate-sized OM-2. Normal  RCA: Dominant. Normal.  LV-gram done in the RAO projection: Ejection fraction = 15% severe global HK. I do not see clear MR jet  Assessment:  1. Minimal CAD  2. Severe LV dysfunction with low output physiology  3. Significant v-waves in PCWP tracing suggesting of MR    CPX 3/17  FVC 3.37 (73%)    FEV1 2.62 (77%)     FEV1/FVC 78 (103%)       MVV 126 (94%)  Resting HR: 77 Peak HR: 129  (88% age predicted max HR) BP rest: 100/62 BP peak: 130/68 Peak VO2: 16.8 (64% predicted peak VO2) VE/VCO2 slope: 43 OUES: 1.93 Peak RER: 0.95 VE/MVV: 48% PETCO2 at peak: 23 O2pulse: 10  (71% predicted O2pulse)  CPX (11/16) Peak VO2 14.8, VE/VCO2 slope 38.9, RER 0.96.  Submaximal due to knee pain. Moderate restrictive lung pattern, probably at least moderate HF limitation but not conclusive as submaximal.   CPX 2/15 Peak VO2 21.7 VE/VCO2 slope 29 RER 1.12 Low normal functional capacity.   Echo (11/16) EF 20-25%, restrictive diastolic function, RV normal size/systolic function, mild MR, PA systolic pressure 47 mmHg.      S/P Medtronic CRT-D 12/14/12   Labs (8/14): K 4.8, creatinine 0.9          (9/14): K 4.2, creatinine 0.91  (12/07/12)  K 4.8 Creatinine 0.9             (03/29/13 ) K 4.6 Creatinine 0.9 Pro BNP 895          (12/15): K 4.5, creatinine 1.0, BNP 895           (05/13/2015): K 4.0 Creatinine 1.04  BNP 1235   SH: Lives with his wife in [place].  Nonsmoker.   FH: 6 siblings 3 boys 3 girls         2 brothers: 1 with transplant (had frequent VT), other with HF and ICD         1 sister with CHF and ICD => LMNA cardiomyopathy diagnosed by genetic testing.         1 sister with ovarian cancer         1 sister OK          Father with CHF  ROS  General: Weight gain [ ] ; Weight loss [ ] ; Anorexia Cove.Etienne ]; Fatigue Cove.Etienne ]; Fever [ ] ; Chills [ ] ; Weakness [ ]   Cardiac: Chest pain/pressure [ ] ; Resting SOB Cove.Etienne ]; Exertional SOB [ y]; Orthopnea [ ] ; Pedal Edema [ y]; Palpitations [ ] ; Syncope [ ] ; Presyncope [ ] ; Paroxysmal nocturnal dyspnea[ ]   Pulmonary: Cough [ ] ; Wheezing[ ] ; Hemoptysis[ ] ; Sputum [ ] ; Snoring [ ]   GI: Vomiting[ ] ; Dysphagia[ ] ; Melena[ ] ; Hematochezia [ ] ; Heartburn[ ] ; Abdominal pain [ ] ; Constipation [ ] ; Diarrhea [ ] ; BRBPR [ ]   GU: Hematuria[ ] ; Dysuria [ ] ; Nocturia[ ]   Vascular: Pain in legs with walking [  ]; Pain in feet with lying flat [ ] ;  Non-healing sores [ ] ; Stroke [ ] ; TIA [ ] ; Slurred speech [ ] ;  Neuro: Headaches[ ] ; Vertigo[ ] ; Seizures[ ] ; Paresthesias[ ] ;Blurred vision [ ] ; Diplopia [ ] ; Vision changes [ ]   Ortho/Skin: Arthritis Cove.Etienne ]; Joint pain Cove.Etienne ]; Muscle pain [ ] ; Joint swelling [ ] ; Back Pain [ ] ; Rash [ ]   Psych: Depression[ ] ; Anxiety[ ]   Heme: Bleeding problems [ ] ; Clotting disorders [ ] ; Anemia [ ]   Endocrine: Diabetes [ ] ; Thyroid dysfunction[ ]   Past Medical History  Diagnosis Date  . Nephrolithiasis   . GERD (gastroesophageal reflux disease)   . Insomnia   . Nonischemic cardiomyopathy (HCC)     Cardiac catheterization 12/2011 with LVEF 45%, no significant obstructive CAD  . Automatic implantable cardioverter-defibrillator in situ   . H/O hiatal hernia   . Osteoarthritis     "knees" (12/14/2012)  . Pneumonia     Dec 2016  . CHF (congestive heart failure) (HCC)     Current Outpatient Prescriptions  Medication Sig Dispense Refill  . Calcium Carbonate-Vitamin D (CALTRATE 600+D PO) Take 1 tablet by mouth daily.    . Carboxymethylcellulose Sodium (THERATEARS OP) Apply 1 drop to eye 3 (three) times daily as needed (dry eyes).     Marland Kitchen Cod Liver Oil 1000 MG CAPS Take 1 capsule by mouth daily.    Marland Kitchen escitalopram (LEXAPRO) 10 MG tablet Take 10 mg by mouth daily.    . furosemide (LASIX) 20 MG tablet Take 1 tablet (20 mg total) by mouth daily. 90 tablet 3  . gatifloxacin (ZYMAXID) 0.5 % SOLN Place 1 drop into the left eye 2 (two) times daily.     . Glucosamine Sulfate 1000 MG CAPS Take 1,000 mg by mouth daily.    . ivabradine (CORLANOR) 5 MG TABS tablet Take 1 tablet (5 mg total) by mouth 2 (two) times daily with a meal. 60 tablet 6  . ketorolac (ACULAR) 0.5 % ophthalmic solution Place 1 drop into the left eye 2 (two) times daily.     Marland Kitchen losartan (COZAAR) 25 MG tablet Take 0.5 tablets (12.5 mg total) by mouth daily. 45 tablet 3  . Multiple Vitamin (MULTIVITAMIN) tablet Take 1  tablet by mouth daily.    . pantoprazole (PROTONIX) 40 MG tablet Take 40 mg by mouth daily.    . potassium chloride SA (K-DUR,KLOR-CON) 20 MEQ tablet Take 20 mEq by mouth daily.    . prednisoLONE acetate (PRED FORTE) 1 % ophthalmic suspension Place 1 drop into the left eye 4 (four) times daily.    . Psyllium (METAMUCIL PO) Take 1 tablet by mouth daily.    Marland Kitchen spironolactone (ALDACTONE) 25 MG tablet Take 1 tablet (25 mg total) by mouth daily. 90 tablet 3  . vitamin A 8000 UNIT capsule Take 8,000 Units by mouth 3 (three) times a week.     . vitamin B-12 (CYANOCOBALAMIN) 100 MCG tablet Take 100 mcg by mouth daily.    . vitamin C (ASCORBIC ACID) 500 MG tablet Take 500 mg by mouth every other day.      No current facility-administered medications for this encounter.    Filed Vitals:   08/21/15 1221  BP: 102/62  Pulse: 65  Weight: 177 lb (80.287 kg)  SpO2: 97%    Physical Exam: General: Fatigued appearing. Family present. Dyspneic HEENT: normal  Neck: supple. JVP 10 Carotids 2+ bilat; no bruits. No lymphadenopathy or thryomegaly appreciated.  Cor: PMI laterally displaced. Regular rate & rhythm. No murmur.  +s3.  Lungs: clear Abdomen: soft, nontender, mildly distended. No hepatosplenomegaly. No bruits or masses. Good bowel sounds.  Extremities: no cyanosis, clubbing, rash, 1-2+ edema  Neuro: alert & oriented x 3, cranial nerves grossly intact. moves all 4 extremities w/o difficulty. Affect pleasant.  ASSESSMENT & PLAN:  1) Chronic systolic HF:  Sever nonischemic cardiomyopathy.  Sister with LMNA cardiomyopathy.  He has a dilated cardiomyopathy with LBBB, which would fit LMNA cardiomyopathy as well.  EF 20-25% on 11/16 echo.  Has Medtronic CRT-D device.  - He continues to decline. Now with end-stage HF. NYHA IIIB-IV with volume overload - Will admit for IV diuresis and RHC with possible inpatient VAD work-up - Admit SDU. Place PICC. 2) Familial cardiomyopathy: Sister with LMNA  cardiomyopathy diagnosed by genetic testing.  Suspect that he has it also, would fit dilated cardiomyopathy with conduction system disease (LBBB).   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  He was not able to tolerate mexiletine.  He has not been able to tolerate beta blockers.  NSVT noted on device interrogation. He follows with Dr. Graciela Husbands Will need to requantify PVC burden soon.   Arvilla Meres MD  08/21/2015

## 2015-08-21 NOTE — Addendum Note (Signed)
Encounter addended by: Chyrl Civatte, RN on: 08/21/2015  2:34 PM<BR>     Documentation filed: [employer]x [employer], Orders

## 2015-08-21 NOTE — H&P (Addendum)
ADVANCED HF H&P  Patient ID: [name], male DOB: [birth year], 75 y.o. MRN: [id number] Referring Physician: Dr. Diona Browner  Primary Care: Dr. Donzetta Sprung [facility])   HPI:  [name] is a pleasant 75 yo male with a h/o familial cardiomyopathy and LBBB who was referred to the clinic by Dr. Diona Browner for a decrease in his EF. In 2003 he had an echo with EF 45%. ECHO in 2007 showed EF 50-55%.   6 siblings 3 boys, 3 girls  2 brothers: 1 with transplant (had frequent VT), other with HF and ICD  1 sister with CHF and ICD. Sister found to have LMNA mutation.  1 sister with ovarian cancer  1 sister OK  Father with CHF  Was doing well until July 2014 when he was outside and bent over to turn off spigot and got very dizzy and he fell over and caught himself before he hit his face on a rock. He denied LOC. Family said he was confused for a bit. No palpitations , CP or seizure activity. No further episodes. Had a repeat ECHO in 7/14 showed EF 25%, diffuse HK, mild MR, and mild TR. He has a family history of NICM (mother and brother) and his brother is s/p cardiac transplantation. He had a 24 holter monitor placed showing SR 41-112 with average 64 bpm, frequent PVCs with some NSVT. About 8% beats PVCs, also had some pauses ranging 2.0-2.8 seconds. He was then referred to HF clinic.  He has not been able to tolerate low doses of ACEI or beta blocker due to hypotension/orthostatic symptoms. At last appointment, started him back on digoxin at low dose and spironolactone 12.5 mg daily. He has been taking Lasix every other day. He continues to have limiting right knee pain. His CPX was submaximal because of this. Family says that he "pants" with heavier exertion. However, he still goes out to do yardwork. He does get short of breath with yardwork. He is short of breath after walking 300-400 feet on flat ground or with walking up an incline. Weight is down 5 lbs.   Seen in ER a few  days ago with severe SOB. BNP was up. CXR with bilateral effusions. Got 40 IV lasix and 1600cc urine out. Weighs everyday. Doesn't take lasix. Says BP is too low for lasix.   He was put on mexiletine by Dr Graciela Husbands for frequent PVCs with the thought that these were contributing to his cardiomyopathy. However, he stopped it because it made him feel bad. He does not feel palpitations.   Had knee injection with Dr. Magnus Ivan in February 2017 and felt better.   Was seen today in HF Clinic.  At last visit he was fluid overloaded. Gave him lasix 40 x1 then started lasix 20 daily. Weight unchanged.. Some days lasix works and some days it doesn't. SOB with any activity. Feels miserable. Poor appetite. No orthopnea or PND. Digoxin stopped several weeks ago by PCP.   Echo 06/10/15 EF 20-25%% RV moderately reduced.   ICD interrogated personally. No VT/AF. Activity level coming back up now 2 hours/day. Fluid level ok.    R/LHC 11/02/2012 RA = 5  RV = 41/4/8  PA = 39/11 (27)  PCW = 21 v = 35  Fick cardiac output/index = 3.6/1.7  Thermo CO/CI = 3.2/1.5  PVR = 1.6 Woods  SVR = 2075  FA sat = 96%  PA sat = 57%, 60%  Ao Pressure: 115/69 (88)  LV Pressure: 118/18/22  There  was no signficant gradient across the aortic valve on pullback.  Left main: Normal  LAD: Large vessel gives off 1 diagonal. 20-30% proximal lesion. Otherwise normal  LCX: Small ramus. Tiny OM-1. Moderate-sized OM-2. Normal  RCA: Dominant. Normal.  LV-gram done in the RAO projection: Ejection fraction = 15% severe global HK. I do not see clear MR jet  Assessment:  1. Minimal CAD  2. Severe LV dysfunction with low output physiology  3. Significant v-waves in PCWP tracing suggesting of MR    CPX 3/17  FVC 3.37 (73%)    FEV1 2.62 (77%)     FEV1/FVC 78 (103%)     MVV 126 (94%)  Resting HR: 77 Peak HR: 129  (88% age predicted max HR) BP rest: 100/62 BP peak: 130/68 Peak VO2: 16.8 (64% predicted peak  VO2) VE/VCO2 slope: 43 OUES: 1.93 Peak RER: 0.95 VE/MVV: 48% PETCO2 at peak: 23 O2pulse: 10  (71% predicted O2pulse)  CPX (11/16) Peak VO2 14.8, VE/VCO2 slope 38.9, RER 0.96. Submaximal due to knee pain. Moderate restrictive lung pattern, probably at least moderate HF limitation but not conclusive as submaximal.   CPX 2/15 Peak VO2 21.7 VE/VCO2 slope 29 RER 1.12 Low normal functional capacity.   Echo (11/16) EF 20-25%, restrictive diastolic function, RV normal size/systolic function, mild MR, PA systolic pressure 47 mmHg.     S/P Medtronic CRT-D 12/14/12   Labs (8/14): K 4.8, creatinine 0.9  (9/14): K 4.2, creatinine 0.91 (12/07/12) K 4.8 Creatinine 0.9   (03/29/13 ) K 4.6 Creatinine 0.9 Pro BNP 895  (12/15): K 4.5, creatinine 1.0, BNP 895  (05/13/2015): K 4.0 Creatinine 1.04 BNP 1235   SH: Lives with his wife in [place]. Nonsmoker.   FH: 6 siblings 3 boys 3 girls   2 brothers: 1 with transplant (had frequent VT), other with HF and ICD   1 sister with CHF and ICD => LMNA cardiomyopathy diagnosed by genetic testing.   1 sister with ovarian cancer   1 sister OK   Father with CHF  ROS   General: Weight gain [ ] ; Weight loss [ ] ; Anorexia Cove.[name] ]; Fatigue Cove.[name] ]; Fever [ ] ; Chills [ ] ; Weakness [ ]    Cardiac: Chest pain/pressure [ ] ; Resting SOB Cove.[name] ]; Exertional SOB [ y]; Orthopnea [ ] ; Pedal Edema [ y]; Palpitations [ ] ; Syncope [ ] ; Presyncope [ ] ; Paroxysmal nocturnal dyspnea[ ]    Pulmonary: Cough [ ] ; Wheezing[ ] ; Hemoptysis[ ] ; Sputum [ ] ; Snoring [ ]    GI: Vomiting[ ] ; Dysphagia[ ] ; Melena[ ] ; Hematochezia [ ] ; Heartburn[ ] ; Abdominal pain [ ] ; Constipation [ ] ; Diarrhea [ ] ; BRBPR [ ]    GU: Hematuria[ ] ; Dysuria [ ] ; Nocturia[ ]   Vascular: Pain in legs with walking [ ] ; Pain in feet with lying flat [ ] ; Non-healing sores [ ] ; Stroke [ ] ; TIA [ ] ; Slurred speech [ ] ;    Neuro: Headaches[ ] ; Vertigo[ ] ; Seizures[ ] ; Paresthesias[ ] ;Blurred vision [ ] ; Diplopia [ ] ; Vision changes [ ]    Ortho/Skin: Arthritis Cove.[name] ]; Joint pain Cove.[name] ]; Muscle pain [ ] ; Joint swelling [ ] ; Back Pain [ ] ; Rash [ ]    Psych: Depression[ ] ; Anxiety[ ]    Heme: Bleeding problems [ ] ; Clotting disorders [ ] ; Anemia [ ]    Endocrine: Diabetes [ ] ; Thyroid dysfunction[ ]   Past Medical History  Diagnosis Date  . Nephrolithiasis   . GERD (gastroesophageal reflux disease)   . Insomnia   . Nonischemic cardiomyopathy (HCC)  Cardiac catheterization 12/2011 with LVEF 45%, no significant obstructive CAD  . Automatic implantable cardioverter-defibrillator in situ   . H/O hiatal hernia   . Osteoarthritis     "knees" (12/14/2012)  . Pneumonia     Dec 2016  . CHF (congestive heart failure) (HCC)     Current Outpatient Prescriptions  Medication Sig Dispense Refill  . Calcium Carbonate-Vitamin D (CALTRATE 600+D PO) Take 1 tablet by mouth daily.    . Carboxymethylcellulose Sodium (THERATEARS OP) Apply 1 drop to eye 3 (three) times daily as needed (dry eyes).     Marland Kitchen Cod Liver Oil 1000 MG CAPS Take 1 capsule by mouth daily.    Marland Kitchen escitalopram (LEXAPRO) 10 MG tablet Take 10 mg by mouth daily.    . furosemide (LASIX) 20 MG tablet Take 1 tablet (20 mg total) by mouth daily. 90 tablet 3  . gatifloxacin (ZYMAXID) 0.5 % SOLN Place 1 drop into the left eye 2 (two) times daily.     . Glucosamine Sulfate 1000 MG CAPS Take 1,000 mg by mouth daily.    . ivabradine (CORLANOR) 5 MG TABS tablet Take 1 tablet (5 mg total) by mouth 2 (two) times daily with a meal. 60 tablet 6  . ketorolac (ACULAR) 0.5 % ophthalmic solution Place 1 drop into the left eye 2 (two) times daily.     Marland Kitchen losartan (COZAAR) 25 MG tablet Take 0.5 tablets (12.5 mg total) by mouth daily. 45 tablet 3  . Multiple Vitamin (MULTIVITAMIN)  tablet Take 1 tablet by mouth daily.    . pantoprazole (PROTONIX) 40 MG tablet Take 40 mg by mouth daily.    . potassium chloride SA (K-DUR,KLOR-CON) 20 MEQ tablet Take 20 mEq by mouth daily.    . prednisoLONE acetate (PRED FORTE) 1 % ophthalmic suspension Place 1 drop into the left eye 4 (four) times daily.    . Psyllium (METAMUCIL PO) Take 1 tablet by mouth daily.    Marland Kitchen spironolactone (ALDACTONE) 25 MG tablet Take 1 tablet (25 mg total) by mouth daily. 90 tablet 3  . vitamin A 8000 UNIT capsule Take 8,000 Units by mouth 3 (three) times a week.     . vitamin B-12 (CYANOCOBALAMIN) 100 MCG tablet Take 100 mcg by mouth daily.    . vitamin C (ASCORBIC ACID) 500 MG tablet Take 500 mg by mouth every other day.      No current facility-administered medications for this encounter.    Filed Vitals:   08/21/15 1221  BP: 102/62  Pulse: 65  Weight: 177 lb (80.287 kg)  SpO2: 97%    Physical Exam: General: Fatigued appearing. Family present. Dyspneic HEENT: normal  Neck: supple. JVP 10 Carotids 2+ bilat; no bruits. No lymphadenopathy or thryomegaly appreciated.  Cor: PMI laterally displaced. Regular rate & rhythm. No murmur. +s3.  Lungs: clear Abdomen: soft, nontender, mildly distended. No hepatosplenomegaly. No bruits or masses. Good bowel sounds.  Extremities: no cyanosis, clubbing, rash, 1-2+ edema  Neuro: alert & oriented x 3, cranial nerves grossly intact. moves all 4 extremities w/o difficulty. Affect pleasant.  ASSESSMENT & PLAN:  1) Acute on chronic systolic HF: Severe nonischemic cardiomyopathy. Sister with LMNA cardiomyopathy. He has a dilated cardiomyopathy with LBBB, which would fit LMNA cardiomyopathy as well. EF 20-25% on 11/16 echo. Has Medtronic CRT-D device.  - He continues to decline. Now with end-stage HF. NYHA IIIB-IV with volume overload - Will admit for IV diuresis and RHC with possible inpatient VAD  work-up - Admit SDU.  Place PICC. 2) Familial cardiomyopathy: Sister with LMNA cardiomyopathy diagnosed by genetic testing. Suspect that he has it also, would fit dilated cardiomyopathy with conduction system disease (LBBB).  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 He was not able to tolerate mexiletine. He has not been able to tolerate beta blockers. NSVT noted on device interrogation. He follows with Dr. Graciela Husbands Will need to requantify PVC burden soon.   Arvilla Meres MD  08/21/2015 Admitting for acute on chronic systolic HF as above.   Orders placed for PICC line with CVP/Coox.   Casimiro Needle [address] [place], PA-C 08/21/2015 1:44 PM   Agree. Admit from clinic for end-stage HF.  Delanie Tirrell,MD 3:59 PM

## 2015-08-21 NOTE — Addendum Note (Signed)
Encounter addended by: Chyrl Civatte, RN on: 08/21/2015  1:43 PM<BR>     Documentation filed: [employer], Orders

## 2015-08-22 ENCOUNTER — Encounter (HOSPITAL_COMMUNITY): Payer: Self-pay | Admitting: Internal Medicine

## 2015-08-22 ENCOUNTER — Inpatient Hospital Stay (HOSPITAL_COMMUNITY): Payer: Medicare Other

## 2015-08-22 ENCOUNTER — Encounter (HOSPITAL_COMMUNITY)
Admission: AD | Disposition: A | Discharge status: Other Acute Inpatient Hospital | Payer: Self-pay | Source: Ambulatory Visit | Attending: Cardiothoracic Surgery

## 2015-08-22 HISTORY — PX: CARDIAC CATHETERIZATION: SHX172

## 2015-08-22 LAB — POCT I-STAT 3, VENOUS BLOOD GAS (G3P V)
Acid-base deficit: 1 mmol/L (ref 0.0–2.0)
Acid-base deficit: 1 mmol/L (ref 0.0–2.0)
BICARBONATE: 23.9 meq/L (ref 20.0–24.0)
BICARBONATE: 24.5 meq/L — AB (ref 20.0–24.0)
Bicarbonate: 24.1 mEq/L — ABNORMAL HIGH (ref 20.0–24.0)
Bicarbonate: 24.6 mEq/L — ABNORMAL HIGH (ref 20.0–24.0)
O2 SAT: 57 %
O2 SAT: 59 %
O2 Saturation: 55 %
O2 Saturation: 53 %
PCO2 VEN: 41 mmHg — AB (ref 45.0–50.0)
PCO2 VEN: 39.6 mmHg — AB (ref 45.0–50.0)
PH VEN: 7.377 — AB (ref 7.250–7.300)
PH VEN: 7.39 — AB (ref 7.250–7.300)
PO2 VEN: 30 mmHg — AB (ref 31.0–45.0)
PO2 VEN: 30 mmHg — AB (ref 31.0–45.0)
PO2 VEN: 31 mmHg (ref 31.0–45.0)
TCO2: 25 mmol/L (ref 0–100)
TCO2: 26 mmol/L (ref 0–100)
TCO2: 25 mmol/L (ref 0–100)
TCO2: 26 mmol/L (ref 0–100)
pCO2, Ven: 41.2 mmHg — ABNORMAL LOW (ref 45.0–50.0)
pCO2, Ven: 40.5 mmHg — ABNORMAL LOW (ref 45.0–50.0)
pH, Ven: 7.385 — ABNORMAL HIGH (ref 7.250–7.300)
pH, Ven: 7.39 — ABNORMAL HIGH (ref 7.250–7.300)
pO2, Ven: 28 mmHg — ABNORMAL LOW (ref 31.0–45.0)

## 2015-08-22 LAB — POCT I-STAT 3, ART BLOOD GAS (G3+)
Acid-Base Excess: 2 mmol/L (ref 0.0–2.0)
Acid-base deficit: 2 mmol/L (ref 0.0–2.0)
BICARBONATE: 22.7 meq/L (ref 20.0–24.0)
Bicarbonate: 24.2 mEq/L — ABNORMAL HIGH (ref 20.0–24.0)
O2 SAT: 95 %
O2 Saturation: 97 %
PCO2 ART: 35.7 mmHg (ref 35.0–45.0)
PH ART: 7.41 (ref 7.350–7.450)
PH ART: 7.513 — AB (ref 7.350–7.450)
PO2 ART: 91 mmHg (ref 80.0–100.0)
TCO2: 24 mmol/L (ref 0–100)
TCO2: 25 mmol/L (ref 0–100)
pCO2 arterial: 30.2 mmHg — ABNORMAL LOW (ref 35.0–45.0)
pO2, Arterial: 68 mmHg — ABNORMAL LOW (ref 80.0–100.0)

## 2015-08-22 LAB — CBC WITH DIFFERENTIAL/PLATELET
Basophils Absolute: 0 10*3/uL (ref 0.0–0.1)
Basophils Relative: 1 %
EOS ABS: 0.1 10*3/uL (ref 0.0–0.7)
EOS PCT: 2 %
HCT: 36.3 % — ABNORMAL LOW (ref 39.0–52.0)
Hemoglobin: 11.2 g/dL — ABNORMAL LOW (ref 13.0–17.0)
LYMPHS ABS: 0.9 10*3/uL (ref 0.7–4.0)
LYMPHS PCT: 19 %
MCH: 26.5 pg (ref 26.0–34.0)
MCHC: 30.9 g/dL (ref 30.0–36.0)
MCV: 86 fL (ref 78.0–100.0)
MONO ABS: 0.4 10*3/uL (ref 0.1–1.0)
Monocytes Relative: 8 %
Neutro Abs: 3.3 10*3/uL (ref 1.7–7.7)
Neutrophils Relative %: 70 %
PLATELETS: 114 10*3/uL — AB (ref 150–400)
RBC: 4.22 MIL/uL (ref 4.22–5.81)
RDW: 15.6 % — AB (ref 11.5–15.5)
WBC: 4.6 10*3/uL (ref 4.0–10.5)

## 2015-08-22 LAB — PROTIME-INR
INR: 1.36 (ref 0.00–1.49)
Prothrombin Time: 16.8 seconds — ABNORMAL HIGH (ref 11.6–15.2)

## 2015-08-22 LAB — BASIC METABOLIC PANEL
Anion gap: 8 (ref 5–15)
BUN: 11 mg/dL (ref 6–20)
CHLORIDE: 105 mmol/L (ref 101–111)
CO2: 26 mmol/L (ref 22–32)
CREATININE: 1.15 mg/dL (ref 0.61–1.24)
Calcium: 9.1 mg/dL (ref 8.9–10.3)
GFR calc Af Amer: >60 mL/min (ref >60)
GLUCOSE: 99 mg/dL (ref 65–99)
POTASSIUM: 3.9 mmol/L (ref 3.5–5.1)
SODIUM: 139 mmol/L (ref 135–145)

## 2015-08-22 LAB — RAPID URINE DRUG SCREEN, HOSP PERFORMED
Amphetamines: NOT DETECTED
BARBITURATES: NOT DETECTED
Benzodiazepines: NOT DETECTED
Cocaine: NOT DETECTED
OPIATES: NOT DETECTED
TETRAHYDROCANNABINOL: NOT DETECTED

## 2015-08-22 LAB — POCT I-STAT, CHEM 8
BUN: 11 mg/dL (ref 6–20)
CALCIUM ION: 1.26 mmol/L (ref 1.13–1.30)
CREATININE: 1.2 mg/dL (ref 0.61–1.24)
Chloride: 98 mmol/L — ABNORMAL LOW (ref 101–111)
GLUCOSE: 142 mg/dL — AB (ref 65–99)
HEMATOCRIT: 44 % (ref 39.0–52.0)
HEMOGLOBIN: 15 g/dL (ref 13.0–17.0)
Potassium: 3.9 mmol/L (ref 3.5–5.1)
Sodium: 143 mmol/L (ref 135–145)
TCO2: 27 mmol/L (ref 0–100)

## 2015-08-22 LAB — URINALYSIS, ROUTINE W REFLEX MICROSCOPIC
Bilirubin Urine: NEGATIVE
Glucose, UA: NEGATIVE mg/dL
Hgb urine dipstick: NEGATIVE
KETONES UR: NEGATIVE mg/dL
LEUKOCYTES UA: NEGATIVE
NITRITE: NEGATIVE
PH: 7 (ref 5.0–8.0)
PROTEIN: NEGATIVE mg/dL
Specific Gravity, Urine: 1.006 (ref 1.005–1.030)

## 2015-08-22 LAB — MAGNESIUM: MAGNESIUM: 1.9 mg/dL (ref 1.7–2.4)

## 2015-08-22 LAB — TYPE AND SCREEN
ABO/RH(D): O POS
Antibody Screen: NEGATIVE

## 2015-08-22 LAB — LACTATE DEHYDROGENASE: LDH: 170 U/L (ref 98–192)

## 2015-08-22 LAB — ABO/RH: ABO/RH(D): O POS

## 2015-08-22 LAB — CARBOXYHEMOGLOBIN
Carboxyhemoglobin: 1.6 % — ABNORMAL HIGH (ref 0.5–1.5)
Methemoglobin: 0.7 % (ref 0.0–1.5)
O2 SAT: 48.4 %
TOTAL HEMOGLOBIN: 11.8 g/dL — AB (ref 13.5–18.0)

## 2015-08-22 LAB — APTT: aPTT: 34 seconds (ref 24–37)

## 2015-08-22 LAB — PLATELET INHIBITION P2Y12: Platelet Function  P2Y12: 254 [PRU] (ref 194–418)

## 2015-08-22 LAB — URIC ACID: URIC ACID, SERUM: 7.4 mg/dL (ref 4.4–7.6)

## 2015-08-22 LAB — ANTITHROMBIN III: ANTITHROMB III FUNC: 94 % (ref 75–120)

## 2015-08-22 SURGERY — RIGHT/LEFT HEART CATH AND CORONARY ANGIOGRAPHY

## 2015-08-22 MED ORDER — LIDOCAINE HCL (PF) 1 % IJ SOLN
INTRAMUSCULAR | Status: DC | PRN
  Administered 2015-08-22: 2 mL
  Administered 2015-08-22: 15 mL
  Administered 2015-08-22: 2 mL

## 2015-08-22 MED ORDER — SODIUM CHLORIDE 0.9% FLUSH: 3.0000 mL | INTRAVENOUS | Status: DC | PRN

## 2015-08-22 MED ORDER — HEPARIN (PORCINE) IN NACL 2-0.9 UNIT/ML-% IJ SOLN
INTRAMUSCULAR | Status: AC
  Filled 2015-08-22: qty 1000

## 2015-08-22 MED ORDER — MILRINONE LACTATE IN DEXTROSE 20-5 MG/100ML-% IV SOLN: 0.2500 ug/kg/min | INTRAVENOUS | Status: DC

## 2015-08-22 MED ORDER — HEPARIN (PORCINE) IN NACL 2-0.9 UNIT/ML-% IJ SOLN
INTRAMUSCULAR | Status: DC | PRN
  Administered 2015-08-22: 10:00:00

## 2015-08-22 MED ORDER — ASPIRIN 81 MG PO CHEW: 81.0000 mg | CHEWABLE_TABLET | ORAL | Status: DC

## 2015-08-22 MED ORDER — MIDAZOLAM HCL 2 MG/2ML IJ SOLN
INTRAMUSCULAR | Status: AC
  Filled 2015-08-22: qty 2

## 2015-08-22 MED ORDER — SODIUM CHLORIDE 0.9 % IV SOLN: 250.0000 mL | INTRAVENOUS | Status: DC | PRN

## 2015-08-22 MED ORDER — VERAPAMIL HCL 2.5 MG/ML IV SOLN
INTRAVENOUS | Status: DC | PRN
  Administered 2015-08-22: 10 mL via INTRA_ARTERIAL

## 2015-08-22 MED ORDER — ONDANSETRON HCL 4 MG/2ML IJ SOLN: 4.0000 mg | Freq: Four times a day (QID) | INTRAMUSCULAR | Status: DC | PRN

## 2015-08-22 MED ORDER — SODIUM CHLORIDE 0.9 % IV SOLN
INTRAVENOUS | Status: DC | PRN
  Administered 2015-08-22: 10 mL/h via INTRAVENOUS

## 2015-08-22 MED ORDER — HEPARIN SODIUM (PORCINE) 1000 UNIT/ML IJ SOLN
INTRAMUSCULAR | Status: DC | PRN
  Administered 2015-08-22: 3000 [IU] via INTRAVENOUS

## 2015-08-22 MED ORDER — VERAPAMIL HCL 2.5 MG/ML IV SOLN
INTRAVENOUS | Status: AC
  Filled 2015-08-22: qty 2

## 2015-08-22 MED ORDER — SODIUM CHLORIDE 0.9 % IV SOLN: INTRAVENOUS | Status: DC

## 2015-08-22 MED ORDER — HEPARIN SODIUM (PORCINE) 1000 UNIT/ML IJ SOLN
INTRAMUSCULAR | Status: AC
  Filled 2015-08-22: qty 1

## 2015-08-22 MED ORDER — SODIUM CHLORIDE 0.9% FLUSH
3.0000 mL | Freq: Two times a day (BID) | INTRAVENOUS | Status: DC
  Administered 2015-08-22: 3 mL via INTRAVENOUS

## 2015-08-22 MED ORDER — SODIUM CHLORIDE 0.9% FLUSH
3.0000 mL | Freq: Two times a day (BID) | INTRAVENOUS | Status: DC
  Administered 2015-08-22 – 2015-08-24 (×3): 3 mL via INTRAVENOUS

## 2015-08-22 MED ORDER — SODIUM CHLORIDE 0.9 % IV SOLN
INTRAVENOUS | Status: DC
  Administered 2015-08-22: 20:00:00 via INTRAVENOUS

## 2015-08-22 MED ORDER — LIDOCAINE HCL (PF) 1 % IJ SOLN
INTRAMUSCULAR | Status: AC
  Filled 2015-08-22: qty 30

## 2015-08-22 MED ORDER — ENOXAPARIN SODIUM 40 MG/0.4ML ~~LOC~~ SOLN
40.0000 mg | SUBCUTANEOUS | Status: DC
  Administered 2015-08-23 – 2015-08-29 (×7): 40 mg via SUBCUTANEOUS
  Filled 2015-08-22 (×7): qty 0.4

## 2015-08-22 MED ORDER — ACETAMINOPHEN 325 MG PO TABS: 650.0000 mg | ORAL_TABLET | ORAL | Status: DC | PRN

## 2015-08-22 MED ORDER — NITROGLYCERIN 1 MG/10 ML FOR IR/CATH LAB
INTRA_ARTERIAL | Status: AC
  Filled 2015-08-22: qty 10

## 2015-08-22 MED ORDER — IOPAMIDOL (ISOVUE-370) INJECTION 76%
INTRAVENOUS | Status: AC
  Filled 2015-08-22: qty 100

## 2015-08-22 MED ORDER — IOPAMIDOL (ISOVUE-370) INJECTION 76%
INTRAVENOUS | Status: DC | PRN
  Administered 2015-08-22: 30 mL via INTRAVENOUS

## 2015-08-22 SURGICAL SUPPLY — 14 items
CATH INFINITI 5 FR JL3.5 (CATHETERS) ×3 IMPLANT
CATH INFINITI 5FR ANG PIGTAIL (CATHETERS) ×3 IMPLANT
CATH INFINITI JR4 5F (CATHETERS) ×3 IMPLANT
CATH SWAN GANZ 7F STRAIGHT (CATHETERS) ×3 IMPLANT
DEVICE RAD COMP TR BAND LRG (VASCULAR PRODUCTS) ×3 IMPLANT
GLIDESHEATH SLEND SS 6F .021 (SHEATH) ×3 IMPLANT
KIT HEART LEFT (KITS) ×3 IMPLANT
KIT HEART RIGHT NAMIC (KITS) ×3 IMPLANT
PACK CARDIAC CATHETERIZATION (CUSTOM PROCEDURE TRAY) ×3 IMPLANT
SHEATH PINNACLE 7F 10CM (SHEATH) ×3 IMPLANT
TRANSDUCER W/STOPCOCK (MISCELLANEOUS) ×3 IMPLANT
TUBING CIL FLEX 10 FLL-RA (TUBING) ×3 IMPLANT
WIRE HI TORQ VERSACORE-J 145CM (WIRE) ×3 IMPLANT
WIRE SAFE-T 1.5MM-J .035X260CM (WIRE) ×3 IMPLANT

## 2015-08-22 NOTE — Progress Notes (Signed)
Advanced Heart Failure Rounding Note   Subjective:    75 y/o with LMNA cardiomyopathy admitted 6/14 with low output HF  Feels weak but breathing better. Co-ox 48%. CVP 23. Good diuresis  Objective:   Weight Range:  Vital Signs:   Temp:  [97.5 F (36.4 C)-98.3 F (36.8 C)] 98.2 F (36.8 C) (06/15 0400) Pulse Rate:  [50-113] 100 (06/15 0600) Resp:  [12-26] 20 (06/15 0600) BP: (80-115)/(53-86) 101/54 mmHg (06/15 0600) SpO2:  [88 %-100 %] 95 % (06/15 0600) Weight:  [75.5 kg (166 lb 7.2 oz)-80.287 kg (177 lb)] 75.5 kg (166 lb 7.2 oz) (06/15 0400) Last BM Date: 08/21/15  Weight change: Filed Weights   08/21/15 1433 08/22/15 0400  Weight: 80.287 kg (177 lb) 75.5 kg (166 lb 7.2 oz)    Intake/Output:   Intake/Output Summary (Last 24 hours) at 08/22/15 0758 Last data filed at 08/22/15 0600  Gross per 24 hour  Intake    360 ml  Output   3700 ml  Net  -3340 ml     Physical Exam: General: Fatigued appearing. Family present. Dyspneic HEENT: normal  Neck: supple. JVP 10 Carotids 2+ bilat; no bruits. No lymphadenopathy or thryomegaly appreciated.  Cor: PMI laterally displaced. Regular rate & rhythm. No murmur. +s3.  Lungs: clear Abdomen: soft, nontender, mildly distended. No hepatosplenomegaly. No bruits or masses. Good bowel sounds.  Extremities: no cyanosis, clubbing, rash, 1-2+ edema . PICC RUE Neuro: alert & oriented x 3, cranial nerves grossly intact. moves all 4 extremities w/o difficulty. Affect pleasant.  Telemetry:  Sinus with frequent PVCs  Labs: Basic Metabolic Panel:  Recent Labs Lab 08/21/15 1345 08/22/15 0355  NA 137 139  K 4.5 3.9  CL 107 105  CO2 22 26  GLUCOSE 117* 99  BUN 11 11  CREATININE 1.17 1.15  CALCIUM 9.4 9.1  MG 2.1  --     Liver Function Tests:  Recent Labs Lab 08/21/15 1345  AST 37  ALT 36  ALKPHOS 43  BILITOT 1.7*  PROT 6.4*  ALBUMIN 3.6   No results for input(s): LIPASE, AMYLASE in the last 168 hours. No  results for input(s): AMMONIA in the last 168 hours.  CBC:  Recent Labs Lab 08/21/15 1345 08/22/15 0355  WBC 5.9 4.6  NEUTROABS 4.2 3.3  HGB 12.7* 11.2*  HCT 41.1 36.3*  MCV 85.3 86.0  PLT 124* 114*    Cardiac Enzymes: No results for input(s): CKTOTAL, CKMB, CKMBINDEX, TROPONINI in the last 168 hours.  BNP: BNP (last 3 results)  Recent Labs  01/25/15 1128 05/13/15 2248 08/21/15 1345  BNP 886.7* 1235.6* 2132.9*    ProBNP (last 3 results) No results for input(s): PROBNP in the last 8760 hours.    Other results:  Imaging: Dg Chest Port 1 View  08/21/2015  CLINICAL DATA:  Evaluate PICC line placement. EXAM: PORTABLE CHEST 1 VIEW COMPARISON:  05/13/2015 FINDINGS: There is a right arm PICC line. PICC line tip in the lower SVC region. 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 Evidence for bilateral pleural effusions, right side greater than left. Haziness in the right lung may be related to pleural fluid. Heart size remains mildly enlarged. No definite pulmonary edema. IMPRESSION: PICC line tip in the lower SVC region. Stable mild cardiomegaly. Bilateral pleural effusions, right side greater than left. Electronically Signed   By: Richarda Overlie M.D.   On: 08/21/2015 20:50      Medications:     Scheduled Medications: . calcium-vitamin D  Oral Daily  . escitalopram  10 mg Oral Daily  . furosemide  80 mg Intravenous BID  . gatifloxacin  1 drop Left Eye BID  . ivabradine  5 mg Oral BID WC  . ketorolac  1 drop Left Eye BID  . losartan  12.5 mg Oral Daily  . multivitamin with minerals  1 tablet Oral Daily  . pantoprazole  40 mg Oral Daily  . potassium chloride SA  20 mEq Oral Daily  . prednisoLONE acetate  1 drop Left Eye QID  . sodium chloride flush  10-40 mL Intracatheter Q12H  . sodium chloride flush  3 mL Intravenous Q12H  . spironolactone  25 mg Oral Daily  . vitamin B-12  100 mcg Oral Daily  . vitamin C  500 mg Oral QODAY     Infusions:     PRN  Medications:  sodium chloride, acetaminophen, ondansetron (ZOFRAN) IV, polyvinyl alcohol, psyllium, sodium chloride flush, sodium chloride flush   Assessment:   1. Acute on chronic systolic HF --LMNA cardiomyopathy. EF 15%. Cath 8/14 with normal cors 2. Frequent PVCs    Plan/Discussion:    He has end-stage HF with low output and volume overload despite optimal medical therapy. Will need VAD work-up. Discussed with him and his family at length. Also d/w VAD team.   Will plan R/L cath today and leave Swan in. Will have VAD patient meet with him soon.   Total time spent 35 minutes. Over half that time spent discussing above.    Length of Stay: 1   Taj Nevins MD 08/22/2015, 7:58 AM  Advanced Heart Failure Team Pager [phone] (M-F; 7a - 4p)  Please contact [facility] for night-coverage after hours (4p -7a ) and weekends on [email or web address]

## 2015-08-22 NOTE — H&P (View-Only) (Signed)
Advanced Heart Failure Rounding Note   Subjective:    75 y/o with LMNA cardiomyopathy admitted 6/14 with low output HF  Feels weak but breathing better. Co-ox 48%. CVP 23. Good diuresis  Objective:   Weight Range:  Vital Signs:   Temp:  [97.5 F (36.4 C)-98.3 F (36.8 C)] 98.2 F (36.8 C) (06/15 0400) Pulse Rate:  [50-113] 100 (06/15 0600) Resp:  [12-26] 20 (06/15 0600) BP: (80-115)/(53-86) 101/54 mmHg (06/15 0600) SpO2:  [88 %-100 %] 95 % (06/15 0600) Weight:  [75.5 kg (166 lb 7.2 oz)-80.287 kg (177 lb)] 75.5 kg (166 lb 7.2 oz) (06/15 0400) Last BM Date: 08/21/15  Weight change: Filed Weights   08/21/15 1433 08/22/15 0400  Weight: 80.287 kg (177 lb) 75.5 kg (166 lb 7.2 oz)    Intake/Output:   Intake/Output Summary (Last 24 hours) at 08/22/15 0758 Last data filed at 08/22/15 0600  Gross per 24 hour  Intake    360 ml  Output   3700 ml  Net  -3340 ml     Physical Exam: General: Fatigued appearing. Family present. Dyspneic HEENT: normal  Neck: supple. JVP 10 Carotids 2+ bilat; no bruits. No lymphadenopathy or thryomegaly appreciated.  Cor: PMI laterally displaced. Regular rate & rhythm. No murmur. +s3.  Lungs: clear Abdomen: soft, nontender, mildly distended. No hepatosplenomegaly. No bruits or masses. Good bowel sounds.  Extremities: no cyanosis, clubbing, rash, 1-2+ edema . PICC RUE Neuro: alert & oriented x 3, cranial nerves grossly intact. moves all 4 extremities w/o difficulty. Affect pleasant.  Telemetry:  Sinus with frequent PVCs  Labs: Basic Metabolic Panel:  Recent Labs Lab 08/21/15 1345 08/22/15 0355  NA 137 139  K 4.5 3.9  CL 107 105  CO2 22 26  GLUCOSE 117* 99  BUN 11 11  CREATININE 1.17 1.15  CALCIUM 9.4 9.1  MG 2.1  --     Liver Function Tests:  Recent Labs Lab 08/21/15 1345  AST 37  ALT 36  ALKPHOS 43  BILITOT 1.7*  PROT 6.4*  ALBUMIN 3.6   No results for input(s): LIPASE, AMYLASE in the last 168 hours. No  results for input(s): AMMONIA in the last 168 hours.  CBC:  Recent Labs Lab 08/21/15 1345 08/22/15 0355  WBC 5.9 4.6  NEUTROABS 4.2 3.3  HGB 12.7* 11.2*  HCT 41.1 36.3*  MCV 85.3 86.0  PLT 124* 114*    Cardiac Enzymes: No results for input(s): CKTOTAL, CKMB, CKMBINDEX, TROPONINI in the last 168 hours.  BNP: BNP (last 3 results)  Recent Labs  01/25/15 1128 05/13/15 2248 08/21/15 1345  BNP 886.7* 1235.6* 2132.9*    ProBNP (last 3 results) No results for input(s): PROBNP in the last 8760 hours.    Other results:  Imaging: Dg Chest Port 1 View  08/21/2015  CLINICAL DATA:  Evaluate PICC line placement. EXAM: PORTABLE CHEST 1 VIEW COMPARISON:  05/13/2015 FINDINGS: There is a right arm PICC line. PICC line tip in the lower SVC region. 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 Evidence for bilateral pleural effusions, right side greater than left. Haziness in the right lung may be related to pleural fluid. Heart size remains mildly enlarged. No definite pulmonary edema. IMPRESSION: PICC line tip in the lower SVC region. Stable mild cardiomegaly. Bilateral pleural effusions, right side greater than left. Electronically Signed   By: Richarda Overlie M.D.   On: 08/21/2015 20:50      Medications:     Scheduled Medications: . calcium-vitamin D  Oral Daily  . escitalopram  10 mg Oral Daily  . furosemide  80 mg Intravenous BID  . gatifloxacin  1 drop Left Eye BID  . ivabradine  5 mg Oral BID WC  . ketorolac  1 drop Left Eye BID  . losartan  12.5 mg Oral Daily  . multivitamin with minerals  1 tablet Oral Daily  . pantoprazole  40 mg Oral Daily  . potassium chloride SA  20 mEq Oral Daily  . prednisoLONE acetate  1 drop Left Eye QID  . sodium chloride flush  10-40 mL Intracatheter Q12H  . sodium chloride flush  3 mL Intravenous Q12H  . spironolactone  25 mg Oral Daily  . vitamin B-12  100 mcg Oral Daily  . vitamin C  500 mg Oral QODAY     Infusions:     PRN  Medications:  sodium chloride, acetaminophen, ondansetron (ZOFRAN) IV, polyvinyl alcohol, psyllium, sodium chloride flush, sodium chloride flush   Assessment:   1. Acute on chronic systolic HF --LMNA cardiomyopathy. EF 15%. Cath 8/14 with normal cors 2. Frequent PVCs    Plan/Discussion:    He has end-stage HF with low output and volume overload despite optimal medical therapy. Will need VAD work-up. Discussed with him and his family at length. Also d/w VAD team.   Will plan R/L cath today and leave Swan in. Will have VAD patient meet with him soon.   Total time spent 35 minutes. Over half that time spent discussing above.    Length of Stay: 1   Nera Haworth MD 08/22/2015, 7:58 AM  Advanced Heart Failure Team Pager [phone] (M-F; 7a - 4p)  Please contact [facility] for night-coverage after hours (4p -7a ) and weekends on [email or web address]     

## 2015-08-22 NOTE — Progress Notes (Signed)
Physician notified: Bensihmon At: 1335  Regarding: Pt having frequent ectopy and runs of Vtach.  Awaiting return response.   Returned Response at: 1335  Order(s): ANDY, PA--get Mg++ level, will let Dr. Jesusita Oka know. RN to continue to monitor.

## 2015-08-22 NOTE — Interval H&P Note (Signed)
History and Physical Interval Note:  08/22/2015 9:09 AM  [name]  has presented today for surgery, with the diagnosis of hf  The various methods of treatment have been discussed with the patient and family. After consideration of risks, benefits and other options for treatment, the patient has consented to  Procedure(s): Right/Left Heart Cath and Coronary Angiography (N/A) as a surgical intervention .  The patient's history has been reviewed, patient examined, no change in status, stable for surgery.  I have reviewed the patient's chart and labs.  Questions were answered to the patient's satisfaction.     Layani Foronda, Reuel Boom

## 2015-08-22 NOTE — Care Management Important Message (Signed)
Important Message  Patient Details  Name: [name] MRN: [id number] Date of Birth: 19-Dec-1940   Medicare Important Message Given:  Yes    [name] 08/22/2015, 9:44 AM

## 2015-08-23 ENCOUNTER — Inpatient Hospital Stay (HOSPITAL_COMMUNITY): Payer: Medicare Other

## 2015-08-23 DIAGNOSIS — I2589 Other forms of chronic ischemic heart disease: Secondary | ICD-10-CM

## 2015-08-23 DIAGNOSIS — I5023 Acute on chronic systolic (congestive) heart failure: Secondary | ICD-10-CM

## 2015-08-23 DIAGNOSIS — E43 Unspecified severe protein-calorie malnutrition: Secondary | ICD-10-CM | POA: Insufficient documentation

## 2015-08-23 DIAGNOSIS — Z01810 Encounter for preprocedural cardiovascular examination: Secondary | ICD-10-CM

## 2015-08-23 DIAGNOSIS — I493 Ventricular premature depolarization: Secondary | ICD-10-CM

## 2015-08-23 DIAGNOSIS — Z515 Encounter for palliative care: Secondary | ICD-10-CM | POA: Insufficient documentation

## 2015-08-23 DIAGNOSIS — R570 Cardiogenic shock: Secondary | ICD-10-CM

## 2015-08-23 DIAGNOSIS — Z7189 Other specified counseling: Secondary | ICD-10-CM | POA: Insufficient documentation

## 2015-08-23 LAB — CARBOXYHEMOGLOBIN
CARBOXYHEMOGLOBIN: 1.8 % — AB (ref 0.5–1.5)
Carboxyhemoglobin: 1.9 % — ABNORMAL HIGH (ref 0.5–1.5)
METHEMOGLOBIN: 0.6 % (ref 0.0–1.5)
Methemoglobin: 0.6 % (ref 0.0–1.5)
O2 SAT: 55 %
O2 Saturation: 68.4 %
TOTAL HEMOGLOBIN: 12.8 g/dL — AB (ref 13.5–18.0)
Total hemoglobin: 12.1 g/dL — ABNORMAL LOW (ref 13.5–18.0)

## 2015-08-23 LAB — BASIC METABOLIC PANEL
ANION GAP: 7 (ref 5–15)
BUN: 9 mg/dL (ref 6–20)
CALCIUM: 8.9 mg/dL (ref 8.9–10.3)
CO2: 27 mmol/L (ref 22–32)
Chloride: 105 mmol/L (ref 101–111)
Creatinine, Ser: 1.1 mg/dL (ref 0.61–1.24)
GFR calc Af Amer: >60 mL/min (ref >60)
Glucose, Bld: 105 mg/dL — ABNORMAL HIGH (ref 65–99)
POTASSIUM: 3.3 mmol/L — AB (ref 3.5–5.1)
SODIUM: 139 mmol/L (ref 135–145)

## 2015-08-23 LAB — CBC WITH DIFFERENTIAL/PLATELET
BASOS ABS: 0 10*3/uL (ref 0.0–0.1)
BASOS PCT: 0 %
EOS PCT: 2 %
Eosinophils Absolute: 0.1 10*3/uL (ref 0.0–0.7)
HCT: 39.7 % (ref 39.0–52.0)
Hemoglobin: 12.2 g/dL — ABNORMAL LOW (ref 13.0–17.0)
LYMPHS PCT: 17 %
Lymphs Abs: 0.9 10*3/uL (ref 0.7–4.0)
MCH: 25.9 pg — ABNORMAL LOW (ref 26.0–34.0)
MCHC: 30.7 g/dL (ref 30.0–36.0)
MCV: 84.3 fL (ref 78.0–100.0)
MONO ABS: 0.4 10*3/uL (ref 0.1–1.0)
Monocytes Relative: 8 %
NEUTROS ABS: 3.9 10*3/uL (ref 1.7–7.7)
Neutrophils Relative %: 73 %
PLATELETS: 107 10*3/uL — AB (ref 150–400)
RBC: 4.71 MIL/uL (ref 4.22–5.81)
RDW: 15.2 % (ref 11.5–15.5)
WBC: 5.4 10*3/uL (ref 4.0–10.5)

## 2015-08-23 LAB — PREALBUMIN: Prealbumin: 3.8 mg/dL — ABNORMAL LOW (ref 18–38)

## 2015-08-23 LAB — MAGNESIUM: Magnesium: 2 mg/dL (ref 1.7–2.4)

## 2015-08-23 LAB — HEPATITIS B SURFACE ANTIGEN: HEP B S AG: NEGATIVE

## 2015-08-23 LAB — PSA: PSA: 1.54 ng/mL (ref 0.00–4.00)

## 2015-08-23 LAB — LUPUS ANTICOAGULANT PANEL
DRVVT: 36.3 s (ref 0.0–47.0)
PTT Lupus Anticoagulant: 35.8 s (ref 0.0–43.6)

## 2015-08-23 LAB — HEPATITIS C ANTIBODY: HCV Ab: <0.1 s/co ratio (ref 0.0–0.9)

## 2015-08-23 LAB — HEPATITIS B CORE ANTIBODY, IGM: HEP B C IGM: NEGATIVE

## 2015-08-23 LAB — HEMOGLOBIN A1C
Hgb A1c MFr Bld: 6.1 % — ABNORMAL HIGH (ref 4.8–5.6)
Mean Plasma Glucose: 128 mg/dL

## 2015-08-23 LAB — HIV ANTIBODY (ROUTINE TESTING W REFLEX): HIV Screen 4th Generation wRfx: NONREACTIVE

## 2015-08-23 LAB — HEPATITIS B SURFACE ANTIBODY, QUANTITATIVE: Hepatitis B-Post: <3.1 m[IU]/mL — ABNORMAL LOW (ref >9.9)

## 2015-08-23 MED ORDER — SODIUM CHLORIDE 0.9% FLUSH: 10.0000 mL | INTRAVENOUS | Status: DC | PRN

## 2015-08-23 MED ORDER — ENSURE ENLIVE PO LIQD
237.0000 mL | Freq: Three times a day (TID) | ORAL | Status: DC
  Administered 2015-08-23 – 2015-08-29 (×16): 237 mL via ORAL

## 2015-08-23 MED ORDER — POTASSIUM CHLORIDE CRYS ER 20 MEQ PO TBCR
40.0000 meq | EXTENDED_RELEASE_TABLET | Freq: Every day | ORAL | Status: DC
  Administered 2015-08-23 – 2015-08-29 (×7): 40 meq via ORAL
  Filled 2015-08-23 (×7): qty 2

## 2015-08-23 MED ORDER — KETOROLAC TROMETHAMINE 0.5 % OP SOLN
1.0000 [drp] | Freq: Every day | OPHTHALMIC | Status: DC
  Administered 2015-08-23: 1 [drp] via OPHTHALMIC
  Filled 2015-08-23: qty 3

## 2015-08-23 MED ORDER — AMIODARONE LOAD VIA INFUSION
150.0000 mg | Freq: Once | INTRAVENOUS | Status: AC
  Administered 2015-08-23: 150 mg via INTRAVENOUS
  Filled 2015-08-23: qty 83.34

## 2015-08-23 MED ORDER — PSYLLIUM 0.52 G PO CAPS: 0.5200 g | ORAL_CAPSULE | Freq: Every day | ORAL | Status: DC | PRN

## 2015-08-23 MED ORDER — MILRINONE LACTATE IN DEXTROSE 20-5 MG/100ML-% IV SOLN
0.1250 ug/kg/min | INTRAVENOUS | Status: DC
  Administered 2015-08-23: 0.25 ug/kg/min via INTRAVENOUS
  Administered 2015-08-24 – 2015-08-28 (×4): 0.125 ug/kg/min via INTRAVENOUS
  Filled 2015-08-23 (×8): qty 100

## 2015-08-23 MED ORDER — SODIUM CHLORIDE 0.9 % IV BOLUS (SEPSIS)
250.0000 mL | Freq: Once | INTRAVENOUS | Status: AC
  Administered 2015-08-23: 250 mL via INTRAVENOUS

## 2015-08-23 MED ORDER — PSYLLIUM 0.52 G PO CAPS
0.5200 g | ORAL_CAPSULE | Freq: Every day | ORAL | Status: DC
  Administered 2015-08-23 – 2015-08-26 (×2): 0.52 g via ORAL
  Filled 2015-08-23 (×4): qty 1

## 2015-08-23 MED ORDER — SODIUM CHLORIDE 0.9% FLUSH
10.0000 mL | Freq: Two times a day (BID) | INTRAVENOUS | Status: DC
  Administered 2015-08-23 – 2015-08-26 (×5): 10 mL
  Administered 2015-08-26: 30 mL
  Administered 2015-08-27 – 2015-08-29 (×5): 10 mL

## 2015-08-23 MED ORDER — AMIODARONE HCL IN DEXTROSE 360-4.14 MG/200ML-% IV SOLN
30.0000 mg/h | INTRAVENOUS | Status: DC
  Administered 2015-08-23 – 2015-08-29 (×11): 30 mg/h via INTRAVENOUS
  Administered 2015-08-30: 08:00:00 via INTRAVENOUS
  Filled 2015-08-23 (×14): qty 200

## 2015-08-23 MED ORDER — PREDNISOLONE ACETATE 1 % OP SUSP
1.0000 [drp] | Freq: Every day | OPHTHALMIC | Status: DC
  Administered 2015-08-23: 1 [drp] via OPHTHALMIC
  Filled 2015-08-23: qty 1

## 2015-08-23 MED ORDER — AMIODARONE HCL IN DEXTROSE 360-4.14 MG/200ML-% IV SOLN
60.0000 mg/h | INTRAVENOUS | Status: AC
  Administered 2015-08-23 (×2): 60 mg/h via INTRAVENOUS
  Filled 2015-08-23 (×2): qty 200

## 2015-08-23 MED ORDER — NOREPINEPHRINE BITARTRATE 1 MG/ML IV SOLN
0.0000 ug/min | INTRAVENOUS | Status: DC
  Administered 2015-08-23: 5 ug/min via INTRAVENOUS
  Administered 2015-08-24: 15 ug/min via INTRAVENOUS
  Administered 2015-08-25 – 2015-08-26 (×2): 10 ug/min via INTRAVENOUS
  Administered 2015-08-27 – 2015-08-28 (×2): 14 ug/min via INTRAVENOUS
  Administered 2015-08-29: 17 ug/min via INTRAVENOUS
  Administered 2015-08-29: 18 ug/min via INTRAVENOUS
  Filled 2015-08-23 (×9): qty 16

## 2015-08-23 NOTE — Progress Notes (Addendum)
No charge note.  Met with family at bedside.  Wife, [name] indicated that she wants 1 more daughter present for the Palliative conversation.  She will contact me with a convenient time to meet today.  Addendum:  Meeting scheduled at 4:00 pm on 6/16.   Imogene Burn, [place] [facility] Pager: [phone]

## 2015-08-23 NOTE — Care Management Important Message (Signed)
Important Message  Patient Details  Name: [name] MRN: [id number] Date of Birth: [birth year]   Medicare Important Message Given:  Yes    [name] 08/23/2015, 9:18 AM

## 2015-08-23 NOTE — Progress Notes (Signed)
MD paged and made aware of drop in patient SBP. Pt remains alert and oriented, and arouses to voice. Family at bedside. Pt has no complaints. New orders received and followed. Will continue to monitor closely.
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
## 2015-08-23 NOTE — Progress Notes (Signed)
BP remains low after orders from MD followed. Pt continues to be alert, oriented and arousable. MD paged and made aware of continued low SBP. New orders received and followed. Will continue to monitor closely.
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
## 2015-08-23 NOTE — Progress Notes (Addendum)
Pre-op Cardiac Surgery - Pre VAD  Carotid Findings:  Bilateral: No significant (1-39%) ICA stenosis. Antegrade vertebral flow. Difficult evaluation of right side due to line/ bandages.   Lower extremity venous duplex: No evidence of DVT or baker's cyst.   ABI:  Lower  Extremity Right Left  Dorsalis Pedis N/A restricted arm 86  Anterior Tibial 114, Bi 94, Bi  Posterior Tibial 124, Bi 109, Bi  Ankle/Brachial Indices 1.44 1.27     Farrel Demark, RDMS, RVT 08/23/2015

## 2015-08-23 NOTE — Progress Notes (Signed)
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 Normal cors. EF 10% with low cardiac output.   Has diuresed 20 pounds. Feels better. Co-ox remains low. Still with frequent ectopy   Swan numbers RA 1 PA 41/18  PCWP 13 CO/CI 3.2/1.6  Objective:   Weight Range:  Vital Signs:   Temp:  [97.5 F (36.4 C)-98.6 F (37 C)] 98.1 F (36.7 C) (06/16 0847) Pulse Rate:  [31-84] 79 (06/16 0847) Resp:  [10-26] 13 (06/16 0847) BP: (87-129)/(58-99) 100/78 mmHg (06/16 0847) SpO2:  [90 %-99 %] 97 % (06/16 0847) Weight:  [71.396 kg (157 lb 6.4 oz)-75.8 kg (167 lb 1.7 oz)] 71.396 kg (157 lb 6.4 oz) (06/16 0600) Last BM Date: 08/21/15  Weight change: Filed Weights   08/22/15 0400 08/22/15 1235 08/23/15 0600  Weight: 75.5 kg (166 lb 7.2 oz) 75.8 kg (167 lb 1.7 oz) 71.396 kg (157 lb 6.4 oz)    Intake/Output:   Intake/Output Summary (Last 24 hours) at 08/23/15 0909 Last data filed at 08/23/15 0700  Gross per 24 hour  Intake   1458 ml  Output   6400 ml  Net  -4942 ml     Physical Exam: General: Lying in bed NADA HEENT: normal  Neck: supple. RIJ swan Carotids 2+ bilat; no bruits. No lymphadenopathy or thryomegaly appreciated.  Cor: PMI laterally displaced. Regular rate & rhythm. No murmur. +s3.  Lungs: clear Abdomen: soft, nontender, mildly distended. No hepatosplenomegaly. No bruits or masses. Good bowel sounds.  Extremities: no cyanosis, clubbing, rash, no edema . PICC RUE Neuro: alert & oriented x 3, cranial nerves grossly intact. moves all 4 extremities w/o difficulty. Affect pleasant.  Telemetry:  Sinus with frequent PVCs  Labs: Basic Metabolic Panel:  Recent Labs Lab 08/21/15 1345 08/22/15 0355 08/22/15 1527 08/22/15 2143 08/23/15 0454  NA 137 139  --  143 139  K 4.5 3.9  --  3.9 3.3*  CL 107 105  --  98* 105  CO2 22 26  --   --  27  GLUCOSE 117* 99  --  142*  105*  BUN 11 11  --  11 9  CREATININE 1.17 1.15  --  1.20 1.10  CALCIUM 9.4 9.1  --   --  8.9  MG 2.1  --  1.9  --   --     Liver Function Tests:  Recent Labs Lab 08/21/15 1345  AST 37  ALT 36  ALKPHOS 43  BILITOT 1.7*  PROT 6.4*  ALBUMIN 3.6   No results for input(s): LIPASE, AMYLASE in the last 168 hours. No results for input(s): AMMONIA in the last 168 hours.  CBC:  Recent Labs Lab 08/21/15 1345 08/22/15 0355 08/22/15 2143 08/23/15 0454  WBC 5.9 4.6  --  5.4  NEUTROABS 4.2 3.3  --  3.9  HGB 12.7* 11.2* 15.0 12.2*  HCT 41.1 36.3* 44.0 39.7  MCV 85.3 86.0  --  84.3  PLT 124* 114*  --  107*    Cardiac Enzymes: No results for input(s): CKTOTAL, CKMB, CKMBINDEX, TROPONINI in the last 168 hours.  BNP: BNP (last 3 results)  Recent Labs  01/25/15 1128 05/13/15 2248 08/21/15 1345  BNP 886.7* 1235.6* 2132.9*    ProBNP (last 3 results) No results for input(s): PROBNP in the last 8760 hours.    Other results:  Imaging: Dg Chest 2 View  08/22/2015  CLINICAL DATA:  Acute chronic congestive heart failure EXAM: CHEST  2 VIEW COMPARISON:  Portable chest x-ray of 08/21/2015 and 05/13/2015 FINDINGS: There is little change in moderate cardiomegaly with pulmonary vascular congestion and effusions, consistent with congestive heart failure. Pacer and AICD leads remain. Right PICC line is is again noted, possibly slightly lower in position near the expected SVC -RA junction. IMPRESSION: 1. CHF with small effusions. 2. No change in the AICD and pacer leads with right PICC line tip near the expected SVC -RA junction. Electronically Signed   By: Dwyane Dee M.D.   On: 08/22/2015 08:05   Dg Chest Port 1 View  08/21/2015  CLINICAL DATA:  Evaluate PICC line placement. EXAM: PORTABLE CHEST 1 VIEW COMPARISON:  05/13/2015 FINDINGS: There is a right arm PICC line. PICC line tip in the lower SVC region. 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 Evidence for bilateral pleural  effusions, right side greater than left. Haziness in the right lung may be related to pleural fluid. Heart size remains mildly enlarged. No definite pulmonary edema. IMPRESSION: PICC line tip in the lower SVC region. Stable mild cardiomegaly. Bilateral pleural effusions, right side greater than left. Electronically Signed   By: Richarda Overlie M.D.   On: 08/21/2015 20:50     Medications:     Scheduled Medications: . calcium-vitamin D   Oral Daily  . enoxaparin (LOVENOX) injection  40 mg Subcutaneous Q24H  . escitalopram  10 mg Oral Daily  . furosemide  80 mg Intravenous BID  . gatifloxacin  1 drop Left Eye BID  . ivabradine  5 mg Oral BID WC  . ketorolac  1 drop Left Eye BID  . losartan  12.5 mg Oral Daily  . multivitamin with minerals  1 tablet Oral Daily  . pantoprazole  40 mg Oral Daily  . potassium chloride SA  40 mEq Oral Daily  . prednisoLONE acetate  1 drop Left Eye QID  . sodium chloride flush  10-40 mL Intracatheter Q12H  . sodium chloride flush  3 mL Intravenous Q12H  . sodium chloride flush  3 mL Intravenous Q12H  . spironolactone  25 mg Oral Daily  . vitamin B-12  100 mcg Oral Daily  . vitamin C  500 mg Oral QODAY    Infusions:    PRN Medications: sodium chloride, sodium chloride, acetaminophen, ondansetron (ZOFRAN) IV, polyvinyl alcohol, psyllium, sodium chloride flush, sodium chloride flush, sodium chloride flush   Assessment:   1. 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 EF 15%. Cath 8/14 with normal cors 2. Frequent PVCs    Plan/Discussion:    He has end-stage HF with low output and volume overload despite optimal medical therapy. Ernestine Conrad numbers reviewed personally Now diuresed. Will stop lasix. Start milrinone. VAD work-up well underway. He has been seen by Dr. Donata Clay.  He has very frequent PVCs. Has been on mexiltene in past for PVCs with Dr. Graciela Husbands but unable to tolerate. I cannot see that we have tried amiodarone. Will  start IV amio.   Discussed with patient and family at length (including brother who had LVAD and transplant at [facility])  The patient is critically ill with multiple organ systems failure and requires high complexity decision making for assessment and support, frequent evaluation and titration of therapies, application of advanced monitoring technologies and extensive interpretation of multiple databases.   Critical Care Time devoted to patient care services described in this note is 35 Minutes.  Length of Stay: 2   Arvilla Meres MD 08/23/2015, 9:09 AM  Advanced Heart Failure Team Pager [phone] (M-F; 7a - 4p)  Please contact [facility] for night-coverage after hours (4p -7a ) and weekends on [email or web address]

## 2015-08-23 NOTE — Progress Notes (Signed)
Initial Nutrition Assessment  DOCUMENTATION CODES:   Severe malnutrition in context of chronic illness  INTERVENTION:  Provide Ensure Enlive po TID, each supplement provides 350 kcal and 20 grams of protein.  Provide nourishment snacks. (Ordered)  Encourage adequate PO intake.   NUTRITION DIAGNOSIS:   Malnutrition related to chronic illness as evidenced by severe depletion of body fat, severe depletion of muscle mass, energy intake < or equal to 75% for > or equal to 1 month.  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 Normal cors. EF 10% with low cardiac output. Has diuresed 20 pounds. Feels better. Co-ox remains low. Still with frequent ectopy   Meal completion has been 25%. Pt reports appetite is fine however dislikes the food served on his meals. Family at bedside reports however pt with poor po intake and a decreased appetite which has been ongoing over the past 2-3 months. They report pt has been only consuming a little at his meals. Family has been trying to get the pt to consume more at meals. Noted, family at brought in snacks/food (fruit, cereal) from home to encourage po, however pt only has taken a few bites. Usual body weight reported to be ~171 lbs. Noted pt volume overloaded and diuresed 20 lbs. Weight loss related to fluid status. RD to order Ensure to aid in caloric and protein needs. Family reports, they will try to get the pt to consume Ensure. RD to additionally order nourishment snacks per wife request.   Nutrition-Focused physical exam completed. Findings are severe fat depletion, moderate to severe muscle depletion, and mild edema.   Labs and medications reviewed. Potassium low at 3.3, magnesium WNL.    Diet Order:  Diet Heart Room service appropriate?: Yes; Fluid consistency:: Thin  Skin:  Reviewed, no issues  Last BM:  6/14  Height:   Ht Readings from Last 1 Encounters:  08/21/15 6\' 2"  (1.88 m)    Weight:   Wt Readings from Last 1 Encounters:  08/23/15 157 lb 6.4 oz (71.396 kg)    Ideal Body Weight:  86.36 kg  BMI:  Body mass index is 20.2 kg/(m^2).  Estimated Nutritional Needs:   Kcal:  1900-2100  Protein:  85-100 grams  Fluid:  Per MD   EDUCATION NEEDS:   No education needs identified at this time  Roslyn Smiling, MS, RD, LDN Pager # [phone] After hours/ weekend pager # [phone]

## 2015-08-24 ENCOUNTER — Inpatient Hospital Stay (HOSPITAL_COMMUNITY): Payer: Medicare Other

## 2015-08-24 LAB — CBC WITH DIFFERENTIAL/PLATELET
Basophils Absolute: 0 10*3/uL (ref 0.0–0.1)
Basophils Relative: 0 %
EOS ABS: 0.3 10*3/uL (ref 0.0–0.7)
EOS PCT: 3 %
HCT: 43.1 % (ref 39.0–52.0)
Hemoglobin: 13.1 g/dL (ref 13.0–17.0)
LYMPHS ABS: 1.8 10*3/uL (ref 0.7–4.0)
Lymphocytes Relative: 19 %
MCH: 25.5 pg — AB (ref 26.0–34.0)
MCHC: 30.4 g/dL (ref 30.0–36.0)
MCV: 84 fL (ref 78.0–100.0)
MONO ABS: 0.9 10*3/uL (ref 0.1–1.0)
MONOS PCT: 10 %
Neutro Abs: 6.7 10*3/uL (ref 1.7–7.7)
Neutrophils Relative %: 68 %
PLATELETS: 142 10*3/uL — AB (ref 150–400)
RBC: 5.13 MIL/uL (ref 4.22–5.81)
RDW: 15.3 % (ref 11.5–15.5)
WBC: 9.7 10*3/uL (ref 4.0–10.5)

## 2015-08-24 LAB — PROTIME-INR
INR: 1.3 (ref 0.00–1.49)
Prothrombin Time: 16.3 s — ABNORMAL HIGH (ref 11.6–15.2)

## 2015-08-24 LAB — BASIC METABOLIC PANEL WITH GFR
Anion gap: 9 (ref 5–15)
BUN: 15 mg/dL (ref 6–20)
CO2: 25 mmol/L (ref 22–32)
Calcium: 9.1 mg/dL (ref 8.9–10.3)
Chloride: 103 mmol/L (ref 101–111)
Creatinine, Ser: 1.16 mg/dL (ref 0.61–1.24)
GFR calc Af Amer: >60 mL/min
GFR calc non Af Amer: >60 mL/min
Glucose, Bld: 158 mg/dL — ABNORMAL HIGH (ref 65–99)
Potassium: 3.9 mmol/L (ref 3.5–5.1)
Sodium: 137 mmol/L (ref 135–145)

## 2015-08-24 LAB — CARBOXYHEMOGLOBIN
Carboxyhemoglobin: 1.7 % — ABNORMAL HIGH (ref 0.5–1.5)
Methemoglobin: 0.7 % (ref 0.0–1.5)
O2 Saturation: 56 %
TOTAL HEMOGLOBIN: 13 g/dL — AB (ref 13.5–18.0)

## 2015-08-24 LAB — APTT: aPTT: 28 seconds (ref 24–37)

## 2015-08-24 MED ORDER — PREDNISOLONE ACETATE 1 % OP SUSP
1.0000 [drp] | Freq: Every day | OPHTHALMIC | Status: DC
  Administered 2015-08-24 – 2015-08-29 (×6): 1 [drp] via OPHTHALMIC
  Filled 2015-08-24 (×2): qty 1

## 2015-08-24 MED ORDER — KETOROLAC TROMETHAMINE 0.5 % OP SOLN
1.0000 [drp] | Freq: Every day | OPHTHALMIC | Status: DC
  Administered 2015-08-24 – 2015-08-29 (×6): 1 [drp] via OPHTHALMIC
  Filled 2015-08-24 (×2): qty 3

## 2015-08-24 MED ORDER — FUROSEMIDE 10 MG/ML IJ SOLN
80.0000 mg | Freq: Two times a day (BID) | INTRAMUSCULAR | Status: DC
  Administered 2015-08-24 – 2015-08-25 (×2): 80 mg via INTRAVENOUS
  Filled 2015-08-24 (×2): qty 8

## 2015-08-24 NOTE — Progress Notes (Signed)
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 Normal cors. EF 10% with low cardiac output. Has diuresed 20 pounds.   Started on norepi yesterday due to persistent hypotension. Now on norepi 10.  Milrinone cut back. Amio started for frequent PVCs.   Feels weak. Denies dyspnea. Still with frequent ectopy.   Co-ox 56%  Swan numbers RA 10 PA 59/27 PCWP 27 CO/CI 3.3/1.7 SVR 1682 PVR 2.9  Objective:   Weight Range:  Vital Signs:   Temp:  [97.9 F (36.6 C)-99.1 F (37.3 C)] 98.2 F (36.8 C) (06/17 1000) Pulse Rate:  [60] 60 (06/16 1644) Resp:  [11-25] 17 (06/17 1000) BP: (62-115)/(40-90) 104/66 mmHg (06/17 1000) SpO2:  [94 %-98 %] 97 % (06/17 1000) Weight:  [74.571 kg (164 lb 6.4 oz)] 74.571 kg (164 lb 6.4 oz) (06/17 0500) Last BM Date: 08/23/15  Weight change: Filed Weights   08/22/15 1235 08/23/15 0600 08/24/15 0500  Weight: 75.8 kg (167 lb 1.7 oz) 71.396 kg (157 lb 6.4 oz) 74.571 kg (164 lb 6.4 oz)    Intake/Output:   Intake/Output Summary (Last 24 hours) at 08/24/15 1044 Last data filed at 08/24/15 1000  Gross per 24 hour  Intake 2650.78 ml  Output    450 ml  Net 2200.78 ml     Physical Exam: General: Lying in bed NAD HEENT: normal  Neck: supple. RIJ swan Carotids 2+ bilat; no bruits. No lymphadenopathy or thryomegaly appreciated.  Cor: PMI laterally displaced. Regular rate & rhythm. No murmur. +s3.  Lungs: clear Abdomen: soft, nontender, mildly distended. No hepatosplenomegaly. No bruits or masses. Good bowel sounds.  Extremities: no cyanosis, clubbing, rash, no edema . PICC RUE Neuro: alert & oriented x 3, cranial nerves grossly intact. moves all 4 extremities w/o difficulty. Affect pleasant.  Telemetry:  Sinus with frequent PVCs  Labs: Basic Metabolic Panel:  Recent Labs Lab 08/21/15 1345 08/22/15 0355 08/22/15 1527 08/22/15 2143  08/23/15 0453 08/23/15 0454 08/24/15 0538  NA 137 139  --  143  --  139 137  K 4.5 3.9  --  3.9  --  3.3* 3.9  CL 107 105  --  98*  --  105 103  CO2 22 26  --   --   --  27 25  GLUCOSE 117* 99  --  142*  --  105* 158*  BUN 11 11  --  11  --  9 15  CREATININE 1.17 1.15  --  1.20  --  1.10 1.16  CALCIUM 9.4 9.1  --   --   --  8.9 9.1  MG 2.1  --  1.9  --  2.0  --   --     Liver Function Tests:  Recent Labs Lab 08/21/15 1345  AST 37  ALT 36  ALKPHOS 43  BILITOT 1.7*  PROT 6.4*  ALBUMIN 3.6   No results for input(s): LIPASE, AMYLASE in the last 168 hours. No results for input(s): AMMONIA in the last 168 hours.  CBC:  Recent Labs Lab 08/21/15 1345 08/22/15 0355 08/22/15 2143 08/23/15 0454 08/24/15 0538  WBC 5.9 4.6  --  5.4 9.7  NEUTROABS 4.2 3.3  --  3.9 6.7  HGB 12.7* 11.2* 15.0 12.2* 13.1  HCT 41.1 36.3* 44.0 39.7 43.1  MCV 85.3 86.0  --  84.3 84.0  PLT 124* 114*  --  107* 142*  Cardiac Enzymes: No results for input(s): CKTOTAL, CKMB, CKMBINDEX, TROPONINI in the last 168 hours.  BNP: BNP (last 3 results)  Recent Labs  01/25/15 1128 05/13/15 2248 08/21/15 1345  BNP 886.7* 1235.6* 2132.9*    ProBNP (last 3 results) No results for input(s): PROBNP in the last 8760 hours.    Other results:  Imaging: No results found.   Medications:     Scheduled Medications: . calcium-vitamin D   Oral Daily  . enoxaparin (LOVENOX) injection  40 mg Subcutaneous Q24H  . escitalopram  10 mg Oral Daily  . feeding supplement (ENSURE ENLIVE)  237 mL Oral TID BM  . ivabradine  5 mg Oral BID WC  . ketorolac  1 drop Left Eye Daily  . losartan  12.5 mg Oral Daily  . multivitamin with minerals  1 tablet Oral Daily  . pantoprazole  40 mg Oral Daily  . potassium chloride SA  40 mEq Oral Daily  . prednisoLONE acetate  1 drop Left Eye Daily  . psyllium  0.52 g Oral Daily  . sodium chloride flush  10-40 mL Intracatheter Q12H  . sodium chloride flush  10-40 mL  Intracatheter Q12H  . sodium chloride flush  3 mL Intravenous Q12H  . spironolactone  25 mg Oral Daily  . vitamin B-12  100 mcg Oral Daily  . vitamin C  500 mg Oral QODAY    Infusions: . amiodarone 30 mg/hr (08/24/15 1002)  . milrinone 0.125 mcg/kg/min (08/24/15 1002)  . norepinephrine (LEVOPHED) Adult infusion 10.027 mcg/min (08/24/15 0500)    PRN Medications: sodium chloride, acetaminophen, ondansetron (ZOFRAN) IV, polyvinyl alcohol, sodium chloride flush, sodium chloride flush, sodium chloride flush   Assessment:   1. 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 EF 15%. Cath 8/14 with normal cors 2. Frequent PVCs    Plan/Discussion:    Ernestine Conrad numbers done personally . 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 Now on multiple pressors and hemodynamics remain tenuous. Will need inpatient LVAD   Continue current regiment for now. Can titrate norepi as needed. Suspect he will need IABP soon.   Continue IV amio.   Discussed with family and VAD team notified. Will order CT scans today.   The patient is critically ill with multiple organ systems failure and requires high complexity decision making for assessment and support, frequent evaluation and titration of therapies, application of advanced monitoring technologies and extensive interpretation of multiple databases.   Critical Care Time devoted to patient care services described in this note is 45 Minutes.   Length of Stay: 3 Arvilla Meres MD 08/24/2015, 10:44 AM  Advanced Heart Failure Team Pager [phone] (M-F; 7a - 4p)  Please contact [facility] for night-coverage after hours (4p -7a ) and weekends on [email or web address]

## 2015-08-25 LAB — BASIC METABOLIC PANEL
ANION GAP: 8 (ref 5–15)
BUN: 17 mg/dL (ref 6–20)
CHLORIDE: 102 mmol/L (ref 101–111)
CO2: 24 mmol/L (ref 22–32)
Calcium: 8.7 mg/dL — ABNORMAL LOW (ref 8.9–10.3)
Creatinine, Ser: 1.12 mg/dL (ref 0.61–1.24)
GFR calc Af Amer: >60 mL/min (ref >60)
GLUCOSE: 165 mg/dL — AB (ref 65–99)
POTASSIUM: 4.1 mmol/L (ref 3.5–5.1)
Sodium: 134 mmol/L — ABNORMAL LOW (ref 135–145)

## 2015-08-25 LAB — CBC WITH DIFFERENTIAL/PLATELET
Basophils Absolute: 0 10*3/uL (ref 0.0–0.1)
Basophils Relative: 0 %
EOS ABS: 0.4 10*3/uL (ref 0.0–0.7)
EOS PCT: 4 %
HCT: 41.1 % (ref 39.0–52.0)
Hemoglobin: 12.8 g/dL — ABNORMAL LOW (ref 13.0–17.0)
LYMPHS ABS: 1.8 10*3/uL (ref 0.7–4.0)
LYMPHS PCT: 19 %
MCH: 26 pg (ref 26.0–34.0)
MCHC: 31.1 g/dL (ref 30.0–36.0)
MCV: 83.5 fL (ref 78.0–100.0)
MONO ABS: 0.8 10*3/uL (ref 0.1–1.0)
Monocytes Relative: 8 %
Neutro Abs: 6.5 10*3/uL (ref 1.7–7.7)
Neutrophils Relative %: 69 %
PLATELETS: 120 10*3/uL — AB (ref 150–400)
RBC: 4.92 MIL/uL (ref 4.22–5.81)
RDW: 15.4 % (ref 11.5–15.5)
WBC: 9.5 10*3/uL (ref 4.0–10.5)

## 2015-08-25 LAB — CARBOXYHEMOGLOBIN
Carboxyhemoglobin: 1.4 % (ref 0.5–1.5)
Methemoglobin: 0.8 % (ref 0.0–1.5)
O2 Saturation: 59.8 %
TOTAL HEMOGLOBIN: 13.5 g/dL (ref 13.5–18.0)

## 2015-08-25 MED ORDER — FUROSEMIDE 10 MG/ML IJ SOLN
80.0000 mg | Freq: Every day | INTRAMUSCULAR | Status: DC
  Administered 2015-08-26 – 2015-08-29 (×4): 80 mg via INTRAVENOUS
  Filled 2015-08-25 (×4): qty 8

## 2015-08-25 NOTE — Progress Notes (Signed)
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 Normal cors. EF 10% with low cardiac output.   Started on norepi due to persistent hypotension. Was on norepi 15 now on norepi 10.  Milrinone 0.125. Amio started for frequent PVCs.   Feels weak. Denies dyspnea.    Co-ox 60%  Swan numbers RA 4 PA 49/17  PCWP 20 CO/CI 4.1/2.1 SVR 1540 PVR 1.5  Objective:   Weight Range:  Vital Signs:   Temp:  [97.7 F (36.5 C)-99.7 F (37.6 C)] 98.8 F (37.1 C) (06/18 1025) Resp:  [11-30] 21 (06/18 1025) BP: (86-111)/(54-74) 111/74 mmHg (06/18 1000) SpO2:  [94 %-99 %] 97 % (06/18 1025) Weight:  [75.4 kg (166 lb 3.6 oz)] 75.4 kg (166 lb 3.6 oz) (06/18 0500) Last BM Date: 08/23/15  Weight change: Filed Weights   08/23/15 0600 08/24/15 0500 08/25/15 0500  Weight: 71.396 kg (157 lb 6.4 oz) 74.571 kg (164 lb 6.4 oz) 75.4 kg (166 lb 3.6 oz)    Intake/Output:   Intake/Output Summary (Last 24 hours) at 08/25/15 1135 Last data filed at 08/25/15 1010  Gross per 24 hour  Intake 1438.5 ml  Output   1800 ml  Net -361.5 ml     Physical Exam: General: Lying in bed NAD HEENT: normal  Neck: supple. RIJ swan Carotids 2+ bilat; no bruits. No lymphadenopathy or thryomegaly appreciated.  Cor: PMI laterally displaced. Regular rate & rhythm. No murmur. +s3.  Lungs: clear Abdomen: soft, nontender, mildly distended. No hepatosplenomegaly. No bruits or masses. Good bowel sounds.  Extremities: no cyanosis, clubbing, rash, no edema . PICC RUE Neuro: alert & oriented x 3, cranial nerves grossly intact. moves all 4 extremities w/o difficulty. Affect pleasant.  Telemetry:  Sinus with frequent PVCs  Labs: Basic Metabolic Panel:  Recent Labs Lab 08/21/15 1345 08/22/15 0355 08/22/15 1527 08/22/15 2143 08/23/15 0453 08/23/15 0454 08/24/15 0538 08/25/15 0515  NA 137 139  --  143  --  139 137  134*  K 4.5 3.9  --  3.9  --  3.3* 3.9 4.1  CL 107 105  --  98*  --  105 103 102  CO2 22 26  --   --   --  27 25 24   GLUCOSE 117* 99  --  142*  --  105* 158* 165*  BUN 11 11  --  11  --  9 15 17   CREATININE 1.17 1.15  --  1.20  --  1.10 1.16 1.12  CALCIUM 9.4 9.1  --   --   --  8.9 9.1 8.7*  MG 2.1  --  1.9  --  2.0  --   --   --     Liver Function Tests:  Recent Labs Lab 08/21/15 1345  AST 37  ALT 36  ALKPHOS 43  BILITOT 1.7*  PROT 6.4*  ALBUMIN 3.6   No results for input(s): LIPASE, AMYLASE in the last 168 hours. No results for input(s): AMMONIA in the last 168 hours.  CBC:  Recent Labs Lab 08/21/15 1345 08/22/15 0355 08/22/15 2143 08/23/15 0454 08/24/15 0538 08/25/15 0515  WBC 5.9 4.6  --  5.4 9.7 9.5  NEUTROABS 4.2 3.3  --  3.9 6.7 6.5  HGB 12.7* 11.2* 15.0 12.2* 13.1 12.8*  HCT 41.1 36.3* 44.0 39.7 43.1 41.1  MCV 85.3 86.0  --  84.3 84.0 83.5  PLT 124* 114*  --  107* 142* 120*    Cardiac Enzymes: No results for input(s): CKTOTAL, CKMB, CKMBINDEX, TROPONINI in the last 168 hours.  BNP: BNP (last 3 results)  Recent Labs  01/25/15 1128 05/13/15 2248 08/21/15 1345  BNP 886.7* 1235.6* 2132.9*    ProBNP (last 3 results) No results for input(s): PROBNP in the last 8760 hours.    Other results:  Imaging: Ct Abdomen Pelvis Wo Contrast  08/24/2015  CLINICAL DATA:  75 year old male inpatient for preoperative evaluation for LVAD. EXAM: CT CHEST, ABDOMEN AND PELVIS WITHOUT CONTRAST TECHNIQUE: Multidetector CT imaging of the chest, abdomen and pelvis was performed following the standard protocol without IV contrast. COMPARISON:  08/22/2015 chest radiograph. FINDINGS: CT CHEST Mediastinum/Nodes: Cardiomegaly with most prominent dilatation of the left ventricle and left atrium. Mild pericardial effusion/thickening. Three lead left subclavian ICD with lead tips in the right atrium, right ventricular apex and coronary sinus. Right PICC terminates at the  cavoatrial junction. Right internal jugular Swan-Ganz catheter terminates in a segmental right middle lobe pulmonary artery branch. Left anterior descending and left circumflex coronary atherosclerosis. Atherosclerotic nonaneurysmal thoracic aorta, which is normal in course. Dilated main pulmonary artery (3.5 cm diameter). No discrete thyroid nodules. Unremarkable esophagus. No pathologically enlarged axillary, mediastinal or gross hilar lymph nodes, noting limited sensitivity for the detection of hilar adenopathy on this noncontrast study. Lungs/Pleura: No pneumothorax. Small to moderate right and small left layering simple pleural effusions. Diffuse interlobular septal thickening and ground-glass attenuation in both lungs, consistent with pulmonary edema. Mild-to-moderate compressive atelectasis in the dependent lower lobes bilaterally. Mild symmetric biapical pleural-parenchymal scarring. Medial apical left upper lobe 6 mm solid pulmonary nodule (series 205/image 21). No additional significant pulmonary nodules or lung masses in the aerated portions of the lungs. Musculoskeletal: No aggressive appearing focal osseous lesions. Mild spondylosis in the thoracic spine. CT ABDOMEN AND PELVIS Hepatobiliary: Normal liver with no liver mass. Nondistended gallbladder contains layering tiny subcentimeter calcified gallstones with no gallbladder wall thickening or pericholecystic fluid. No biliary ductal dilatation. Pancreas: Normal, with no mass or duct dilation. Spleen: Normal size. No mass. 2.7 cm splenule medial to the spleen in the left upper quadrant. Adrenals/Urinary Tract: Normal adrenals. There are several (greater than 5 on the right and at least 5 on the left) nonobstructing stones in both kidneys measuring up to the 4 mm in the lower right kidney and measuring up to 3 mm in the interpolar left kidney. No hydronephrosis. Simple 3.7 cm exophytic lateral interpolar simple right renal cyst. No additional contour  deforming renal lesions. Normal bladder. Stomach/Bowel: Grossly normal stomach. Normal caliber small bowel with no small bowel wall thickening. Appendectomy. Mild diverticulosis of the proximal sigmoid colon, with no large bowel wall thickening or pericolonic fat stranding. Vascular/Lymphatic: Atherosclerotic nonaneurysmal abdominal aorta. No pathologically enlarged lymph nodes in the abdomen or pelvis. Reproductive: Mild to moderate prostatomegaly with nonspecific coarse internal prostatic calcifications. Other: No pneumoperitoneum, ascites or focal fluid collection. Musculoskeletal: No aggressive appearing focal osseous lesions. Healed deformity in the inferior right pubic ramus. Scattered subcentimeter sclerotic lesions in the bilateral pelvic girdle are nonspecific, favor benign bone islands. Mild-to-moderate degenerative changes in the lumbar spine. IMPRESSION: 1. Solitary 6 mm apical left upper lobe pulmonary nodule. Non-contrast chest CT at 6-12 months is recommended. If the nodule is stable at time of repeat CT, then future CT at 18-24 months (from today's scan) is considered optional for low-risk patients, but is recommended for high-risk patients. This recommendation follows the  consensus statement: Guidelines for Management of Incidental Pulmonary Nodules Detected on CT Images:From the Fleischner Society 2017; published online before print (10.1148/[id number]). 2. Cardiomegaly. Mild pericardial effusion/thickening. Small to moderate right and small left layering simple pleural effusions with associated compressive lower lobe atelectasis. 3. Two vessel coronary atherosclerosis. 4. Dilated main pulmonary artery, suggesting pulmonary arterial hypertension. 5. Cholelithiasis. 6. Mild sigmoid diverticulosis. 7. 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  No hydronephrosis. 8. Prostatomegaly. Electronically Signed   By: Delbert Phenix M.D.   On: 08/24/2015 12:55   Ct Chest Wo Contrast  08/24/2015  CLINICAL  DATA:  75 year old male inpatient for preoperative evaluation for LVAD. EXAM: CT CHEST, ABDOMEN AND PELVIS WITHOUT CONTRAST TECHNIQUE: Multidetector CT imaging of the chest, abdomen and pelvis was performed following the standard protocol without IV contrast. COMPARISON:  08/22/2015 chest radiograph. FINDINGS: CT CHEST Mediastinum/Nodes: Cardiomegaly with most prominent dilatation of the left ventricle and left atrium. Mild pericardial effusion/thickening. Three lead left subclavian ICD with lead tips in the right atrium, right ventricular apex and coronary sinus. Right PICC terminates at the cavoatrial junction. Right internal jugular Swan-Ganz catheter terminates in a segmental right middle lobe pulmonary artery branch. Left anterior descending and left circumflex coronary atherosclerosis. Atherosclerotic nonaneurysmal thoracic aorta, which is normal in course. Dilated main pulmonary artery (3.5 cm diameter). No discrete thyroid nodules. Unremarkable esophagus. No pathologically enlarged axillary, mediastinal or gross hilar lymph nodes, noting limited sensitivity for the detection of hilar adenopathy on this noncontrast study. Lungs/Pleura: No pneumothorax. Small to moderate right and small left layering simple pleural effusions. Diffuse interlobular septal thickening and ground-glass attenuation in both lungs, consistent with pulmonary edema. Mild-to-moderate compressive atelectasis in the dependent lower lobes bilaterally. Mild symmetric biapical pleural-parenchymal scarring. Medial apical left upper lobe 6 mm solid pulmonary nodule (series 205/image 21). No additional significant pulmonary nodules or lung masses in the aerated portions of the lungs. Musculoskeletal: No aggressive appearing focal osseous lesions. Mild spondylosis in the thoracic spine. CT ABDOMEN AND PELVIS Hepatobiliary: Normal liver with no liver mass. Nondistended gallbladder contains layering tiny subcentimeter calcified gallstones with no  gallbladder wall thickening or pericholecystic fluid. No biliary ductal dilatation. Pancreas: Normal, with no mass or duct dilation. Spleen: Normal size. No mass. 2.7 cm splenule medial to the spleen in the left upper quadrant. Adrenals/Urinary Tract: Normal adrenals. There are several (greater than 5 on the right and at least 5 on the left) nonobstructing stones in both kidneys measuring up to the 4 mm in the lower right kidney and measuring up to 3 mm in the interpolar left kidney. No hydronephrosis. Simple 3.7 cm exophytic lateral interpolar simple right renal cyst. No additional contour deforming renal lesions. Normal bladder. Stomach/Bowel: Grossly normal stomach. Normal caliber small bowel with no small bowel wall thickening. Appendectomy. Mild diverticulosis of the proximal sigmoid colon, with no large bowel wall thickening or pericolonic fat stranding. Vascular/Lymphatic: Atherosclerotic nonaneurysmal abdominal aorta. No pathologically enlarged lymph nodes in the abdomen or pelvis. Reproductive: Mild to moderate prostatomegaly with nonspecific coarse internal prostatic calcifications. Other: No pneumoperitoneum, ascites or focal fluid collection. Musculoskeletal: No aggressive appearing focal osseous lesions. Healed deformity in the inferior right pubic ramus. Scattered subcentimeter sclerotic lesions in the bilateral pelvic girdle are nonspecific, favor benign bone islands. Mild-to-moderate degenerative changes in the lumbar spine. IMPRESSION: 1. Solitary 6 mm apical left upper lobe pulmonary nodule. Non-contrast chest CT at 6-12 months is recommended. If the nodule is stable at time of repeat CT, then future CT at 18-24 months (from  today's scan) is considered optional for low-risk patients, but is recommended for high-risk patients. This recommendation follows the consensus statement: Guidelines for Management of Incidental Pulmonary Nodules Detected on CT Images:From the Fleischner Society 2017;  published online before print (10.1148/[id number]). 2. Cardiomegaly. Mild pericardial effusion/thickening. Small to moderate right and small left layering simple pleural effusions with associated compressive lower lobe atelectasis. 3. Two vessel coronary atherosclerosis. 4. Dilated main pulmonary artery, suggesting pulmonary arterial hypertension. 5. Cholelithiasis. 6. Mild sigmoid diverticulosis. 7. 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  No hydronephrosis. 8. Prostatomegaly. Electronically Signed   By: Delbert Phenix M.D.   On: 08/24/2015 12:55     Medications:     Scheduled Medications: . calcium-vitamin D   Oral Daily  . enoxaparin (LOVENOX) injection  40 mg Subcutaneous Q24H  . escitalopram  10 mg Oral Daily  . feeding supplement (ENSURE ENLIVE)  237 mL Oral TID BM  . furosemide  80 mg Intravenous BID  . ketorolac  1 drop Left Eye Daily  . multivitamin with minerals  1 tablet Oral Daily  . pantoprazole  40 mg Oral Daily  . potassium chloride SA  40 mEq Oral Daily  . prednisoLONE acetate  1 drop Left Eye Daily  . psyllium  0.52 g Oral Daily  . sodium chloride flush  10-40 mL Intracatheter Q12H  . sodium chloride flush  10-40 mL Intracatheter Q12H  . sodium chloride flush  3 mL Intravenous Q12H  . vitamin B-12  100 mcg Oral Daily  . vitamin C  500 mg Oral QODAY    Infusions: . amiodarone 30 mg/hr (08/25/15 0722)  . milrinone 0.125 mcg/kg/min (08/24/15 1002)  . norepinephrine (LEVOPHED) Adult infusion 10 mcg/min (08/25/15 1010)    PRN Medications: sodium chloride, acetaminophen, ondansetron (ZOFRAN) IV, polyvinyl alcohol, sodium chloride flush, sodium chloride flush, sodium chloride flush   Assessment:   1. 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 EF 15%. Cath 8/14 with normal cors 2. Frequent PVCs   Plan/Discussion:    Ernestine Conrad numbers done personally. CT scans reviewed. He has end-stage HF with low output and volume overload despite  optimal medical therapy. Now on multiple pressors and hemodynamics improved but remain tenuous. Will need inpatient LVAD likely Thursday or Friday. D/w Dr. Donata Clay. May need IABP early this week as bridge. Cut lasix to 80 daily.   Continue IV amio.   The patient is critically ill with multiple organ systems failure and requires high complexity decision making for assessment and support, frequent evaluation and titration of therapies, application of advanced monitoring technologies and extensive interpretation of multiple databases.   Critical Care Time devoted to patient care services described in this note is 45 Minutes.   Length of Stay: 4 Spencer Peterkin MD 08/25/2015, 11:35 AM  Advanced Heart Failure Team Pager [phone] (M-F; 7a - 4p)  Please contact [facility] for night-coverage after hours (4p -7a ) and weekends on [email or web address]

## 2015-08-26 LAB — BASIC METABOLIC PANEL
ANION GAP: 5 (ref 5–15)
BUN: 15 mg/dL (ref 6–20)
CALCIUM: 8.3 mg/dL — AB (ref 8.9–10.3)
CHLORIDE: 103 mmol/L (ref 101–111)
CO2: 26 mmol/L (ref 22–32)
Creatinine, Ser: 0.97 mg/dL (ref 0.61–1.24)
GFR calc non Af Amer: >60 mL/min (ref >60)
Glucose, Bld: 141 mg/dL — ABNORMAL HIGH (ref 65–99)
POTASSIUM: 3.7 mmol/L (ref 3.5–5.1)
Sodium: 134 mmol/L — ABNORMAL LOW (ref 135–145)

## 2015-08-26 LAB — CBC WITH DIFFERENTIAL/PLATELET
Basophils Absolute: 0.1 10*3/uL (ref 0.0–0.1)
Basophils Relative: 1 %
EOS PCT: 6 %
Eosinophils Absolute: 0.5 10*3/uL (ref 0.0–0.7)
HEMATOCRIT: 39.2 % (ref 39.0–52.0)
Hemoglobin: 12.1 g/dL — ABNORMAL LOW (ref 13.0–17.0)
LYMPHS PCT: 23 %
Lymphs Abs: 1.7 10*3/uL (ref 0.7–4.0)
MCH: 26.3 pg (ref 26.0–34.0)
MCHC: 30.9 g/dL (ref 30.0–36.0)
MCV: 85.2 fL (ref 78.0–100.0)
MONO ABS: 0.7 10*3/uL (ref 0.1–1.0)
MONOS PCT: 9 %
NEUTROS ABS: 4.5 10*3/uL (ref 1.7–7.7)
Neutrophils Relative %: 61 %
PLATELETS: 111 10*3/uL — AB (ref 150–400)
RBC: 4.6 MIL/uL (ref 4.22–5.81)
RDW: 15.6 % — AB (ref 11.5–15.5)
WBC: 7.4 10*3/uL (ref 4.0–10.5)

## 2015-08-26 LAB — CARBOXYHEMOGLOBIN
Carboxyhemoglobin: 1.5 % (ref 0.5–1.5)
Methemoglobin: 0.8 % (ref 0.0–1.5)
O2 Saturation: 60.9 %
Total hemoglobin: 12.7 g/dL — ABNORMAL LOW (ref 13.5–18.0)

## 2015-08-26 LAB — FACTOR 5 LEIDEN

## 2015-08-26 MED ORDER — JEVITY 1.5 CAL/FIBER PO LIQD
1000.0000 mL | ORAL | Status: DC
  Filled 2015-08-26 (×2): qty 1000

## 2015-08-26 MED ORDER — JEVITY 1.5 CAL PO LIQD: 237.0000 mL | ORAL | Status: DC

## 2015-08-26 MED ORDER — JEVITY 1.2 CAL PO LIQD
1000.0000 mL | ORAL | Status: DC
  Filled 2015-08-26 (×2): qty 1000

## 2015-08-26 MED ORDER — PRO-STAT SUGAR FREE PO LIQD
30.0000 mL | Freq: Every day | ORAL | Status: DC
  Administered 2015-08-28 – 2015-08-29 (×2): 30 mL
  Filled 2015-08-26 (×3): qty 30

## 2015-08-26 MED ORDER — OSMOLITE 1.5 CAL PO LIQD
1000.0000 mL | ORAL | Status: DC
  Filled 2015-08-26 (×3): qty 1000

## 2015-08-26 NOTE — Progress Notes (Signed)
   08/26/15 1400  Clinical Encounter Type  Visited With Patient;Family;Patient and family together  Visit Type Follow-up;Psychological support;Spiritual support;Social support;Other (Comment) ([place])  Referral From Other (Comment);Patient;Nurse (Complete HCPOA)  Spiritual Encounters  Spiritual Needs Literature;Prayer;Emotional;Grief support  [facility] returned to pt with notary and witnesses to complete AD & HCPOA paperwork.  Literature completed and given to RN.  2:22 PM Erline Levine

## 2015-08-26 NOTE — Progress Notes (Signed)
Nutrition Follow-up  DOCUMENTATION CODES:   Severe malnutrition in context of chronic illness  INTERVENTION:   Osmolite 1.5 @ 55 ml/hr 30 ml Prostat daily Provides: 2080 kcal, 97 grams protein, and 1005 ml H2O.   NUTRITION DIAGNOSIS:   Malnutrition related to chronic illness as evidenced by severe depletion of body fat, severe depletion of muscle mass, energy intake < or equal to 75% for > or equal to 1 month. Ongoing.   GOAL:   Patient will meet greater than or equal to 90% of their needs Progressing.   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 Normal cors. EF 10% with low cardiac output. Has diuresed 20 pounds. Feels better. Co-ox remains low. Still with frequent ectopy   Medications reviewed and include: oscal with D, MVI, K-dur, vitamin B12 and C, norepinephrine, milrinone, amiodarone Labs reviewed: Na 134 Per RN Cortrak tube pending, no access currently  Plan for LVAD later this week  Jevity 1.5 ordered @ 50 ml/hr, this is non-formulary will adjust to Osmolite 1.5  Diet Order:  Diet Heart Room service appropriate?: Yes; Fluid consistency:: Thin  Skin:  Reviewed, no issues  Last BM:  6/16  Height:   Ht Readings from Last 1 Encounters:  08/21/15 6\' 2"  (1.88 m)    Weight:   Wt Readings from Last 1 Encounters:  08/26/15 165 lb 8 oz (75.07 kg)    Ideal Body Weight:  86.36 kg  BMI:  Body mass index is 21.24 kg/(m^2).  Estimated Nutritional Needs:   Kcal:  1900-2100  Protein:  85-100 grams  Fluid:  Per MD  EDUCATION NEEDS:   No education needs identified at this time  Kendell Bane RD, LDN, CNSC [phone] Pager [phone] After Hours Pager

## 2015-08-26 NOTE — Care Management Important Message (Signed)
Important Message  Patient Details  Name: NICHOLOUS CLEARY MRN: [id number] Date of Birth: 1940-07-25   Medicare Important Message Given:  Yes    [name] 08/26/2015, 10:02 AM

## 2015-08-26 NOTE — Progress Notes (Signed)
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 Normal cors. EF 10% with low cardiac output.   Started on norepi due to persistent hypotension. Remains on norepi 10 mcg and Milrinone 0.125. Amio started for frequent PVCs.   Feels weak. Denies dyspnea.    Co-ox 60%  Swan numbers RA 2 PA 37/27 PCWP 22 CO/CI 4/2  SVR  PVR 1439   Objective:   Weight Range:  Vital Signs:   Temp:  [97.7 F (36.5 C)-99.1 F (37.3 C)] 97.9 F (36.6 C) (06/19 0721) Pulse Rate:  [40] 40 (06/19 0721) Resp:  [13-29] 15 (06/19 0721) BP: (89-111)/(51-82) 103/61 mmHg (06/19 0721) SpO2:  [89 %-99 %] 95 % (06/19 0721) Weight:  [165 lb 8 oz (75.07 kg)] 165 lb 8 oz (75.07 kg) (06/19 0500) Last BM Date: 08/23/15  Weight change: Filed Weights   08/24/15 0500 08/25/15 0500 08/26/15 0500  Weight: 164 lb 6.4 oz (74.571 kg) 166 lb 3.6 oz (75.4 kg) 165 lb 8 oz (75.07 kg)    Intake/Output:   Intake/Output Summary (Last 24 hours) at 08/26/15 0744 Last data filed at 08/26/15 0600  Gross per 24 hour  Intake 878.48 ml  Output   2650 ml  Net -1771.52 ml     Physical Exam: General: Lying in bed NAD. Wife at bedside.  HEENT: normal  Neck: supple. RIJ swan Carotids 2+ bilat; no bruits. No lymphadenopathy or thryomegaly appreciated.  Cor: PMI laterally displaced. Regular rate & rhythm. No murmur. +s3.  Lungs: clear Abdomen: soft, nontender, mildly distended. No hepatosplenomegaly. No bruits or masses. Good bowel sounds.  Extremities: no cyanosis, clubbing, rash, no edema . PICC RUE Neuro: alert & oriented x 3, cranial nerves grossly intact. moves all 4 extremities w/o difficulty. Affect pleasant.  Telemetry:  Sinus with frequent PVCs  Labs: Basic Metabolic Panel:  Recent Labs Lab 08/21/15 1345 08/22/15 0355 08/22/15 1527 08/22/15 2143 08/23/15 0453 08/23/15 0454 08/24/15 0538  08/25/15 0515 08/26/15 0415  NA 137 139  --  143  --  139 137 134* 134*  K 4.5 3.9  --  3.9  --  3.3* 3.9 4.1 3.7  CL 107 105  --  98*  --  105 103 102 103  CO2 22 26  --   --   --  GLUCOSE 117* 99  --  142*  --  105* 158* 165* 141*  BUN 11 11  --  11  --  CREATININE 1.17 1.15  --  1.20  --  1.10 1.16 1.12 0.97  CALCIUM 9.4 9.1  --   --   --  8.9 9.1 8.7* 8.3*  MG 2.1  --  1.9  --  2.0  --   --   --   --     Liver Function Tests:  Recent Labs Lab 08/21/15 1345  AST 37  ALT 36  ALKPHOS 43  BILITOT 1.7*  PROT 6.4*  ALBUMIN 3.6   No results for input(s): LIPASE, AMYLASE in the last 168 hours. No results for input(s): AMMONIA in the last 168 hours.  CBC:  Recent Labs Lab 08/22/15 0355 08/22/15 2143 08/23/15 0454 08/24/15 0538 08/25/15 0515 08/26/15 0415  WBC 4.6  --  5.4 9.7 9.5 7.4  NEUTROABS 3.3  --  3.9 6.7 6.5 4.5  HGB 11.2* 15.0 12.2*  13.1 12.8* 12.1*  HCT 36.3* 44.0 39.7 43.1 41.1 39.2  MCV 86.0  --  84.3 84.0 83.5 85.2  PLT 114*  --  107* 142* 120* 111*    Cardiac Enzymes: No results for input(s): CKTOTAL, CKMB, CKMBINDEX, TROPONINI in the last 168 hours.  BNP: BNP (last 3 results)  Recent Labs  01/25/15 1128 05/13/15 2248 08/21/15 1345  BNP 886.7* 1235.6* 2132.9*    ProBNP (last 3 results) No results for input(s): PROBNP in the last 8760 hours.    Other results:  Imaging: Ct Abdomen Pelvis Wo Contrast  08/24/2015  CLINICAL DATA:  75 year old male inpatient for preoperative evaluation for LVAD. EXAM: CT CHEST, ABDOMEN AND PELVIS WITHOUT CONTRAST TECHNIQUE: Multidetector CT imaging of the chest, abdomen and pelvis was performed following the standard protocol without IV contrast. COMPARISON:  08/22/2015 chest radiograph. FINDINGS: CT CHEST Mediastinum/Nodes: Cardiomegaly with most prominent dilatation of the left ventricle and left atrium. Mild pericardial effusion/thickening. Three lead left subclavian ICD with lead  tips in the right atrium, right ventricular apex and coronary sinus. Right PICC terminates at the cavoatrial junction. Right internal jugular Swan-Ganz catheter terminates in a segmental right middle lobe pulmonary artery branch. Left anterior descending and left circumflex coronary atherosclerosis. Atherosclerotic nonaneurysmal thoracic aorta, which is normal in course. Dilated main pulmonary artery (3.5 cm diameter). No discrete thyroid nodules. Unremarkable esophagus. No pathologically enlarged axillary, mediastinal or gross hilar lymph nodes, noting limited sensitivity for the detection of hilar adenopathy on this noncontrast study. Lungs/Pleura: No pneumothorax. Small to moderate right and small left layering simple pleural effusions. Diffuse interlobular septal thickening and ground-glass attenuation in both lungs, consistent with pulmonary edema. Mild-to-moderate compressive atelectasis in the dependent lower lobes bilaterally. Mild symmetric biapical pleural-parenchymal scarring. Medial apical left upper lobe 6 mm solid pulmonary nodule (series 205/image 21). No additional significant pulmonary nodules or lung masses in the aerated portions of the lungs. Musculoskeletal: No aggressive appearing focal osseous lesions. Mild spondylosis in the thoracic spine. CT ABDOMEN AND PELVIS Hepatobiliary: Normal liver with no liver mass. Nondistended gallbladder contains layering tiny subcentimeter calcified gallstones with no gallbladder wall thickening or pericholecystic fluid. No biliary ductal dilatation. Pancreas: Normal, with no mass or duct dilation. Spleen: Normal size. No mass. 2.7 cm splenule medial to the spleen in the left upper quadrant. Adrenals/Urinary Tract: Normal adrenals. There are several (greater than 5 on the right and at least 5 on the left) nonobstructing stones in both kidneys measuring up to the 4 mm in the lower right kidney and measuring up to 3 mm in the interpolar left kidney. No  hydronephrosis. Simple 3.7 cm exophytic lateral interpolar simple right renal cyst. No additional contour deforming renal lesions. Normal bladder. Stomach/Bowel: Grossly normal stomach. Normal caliber small bowel with no small bowel wall thickening. Appendectomy. Mild diverticulosis of the proximal sigmoid colon, with no large bowel wall thickening or pericolonic fat stranding. Vascular/Lymphatic: Atherosclerotic nonaneurysmal abdominal aorta. No pathologically enlarged lymph nodes in the abdomen or pelvis. Reproductive: Mild to moderate prostatomegaly with nonspecific coarse internal prostatic calcifications. Other: No pneumoperitoneum, ascites or focal fluid collection. Musculoskeletal: No aggressive appearing focal osseous lesions. Healed deformity in the inferior right pubic ramus. Scattered subcentimeter sclerotic lesions in the bilateral pelvic girdle are nonspecific, favor benign bone islands. Mild-to-moderate degenerative changes in the lumbar spine. IMPRESSION: 1. Solitary 6 mm apical left upper lobe pulmonary nodule. Non-contrast chest CT at 6-12 months is recommended. If the nodule is stable at time of repeat CT, then future CT  at 18-24 months (from today's scan) is considered optional for low-risk patients, but is recommended for high-risk patients. This recommendation follows the consensus statement: Guidelines for Management of Incidental Pulmonary Nodules Detected on CT Images:From the Fleischner Society 2017; published online before print (10.1148/[id number]). 2. Cardiomegaly. Mild pericardial effusion/thickening. Small to moderate right and small left layering simple pleural effusions with associated compressive lower lobe atelectasis. 3. Two vessel coronary atherosclerosis. 4. Dilated main pulmonary artery, suggesting pulmonary arterial hypertension. 5. Cholelithiasis. 6. Mild sigmoid diverticulosis. 7. 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  No hydronephrosis. 8. Prostatomegaly.  Electronically Signed   By: Delbert Phenix M.D.   On: 08/24/2015 12:55   Ct Chest Wo Contrast  08/24/2015  CLINICAL DATA:  75 year old male inpatient for preoperative evaluation for LVAD. EXAM: CT CHEST, ABDOMEN AND PELVIS WITHOUT CONTRAST TECHNIQUE: Multidetector CT imaging of the chest, abdomen and pelvis was performed following the standard protocol without IV contrast. COMPARISON:  08/22/2015 chest radiograph. FINDINGS: CT CHEST Mediastinum/Nodes: Cardiomegaly with most prominent dilatation of the left ventricle and left atrium. Mild pericardial effusion/thickening. Three lead left subclavian ICD with lead tips in the right atrium, right ventricular apex and coronary sinus. Right PICC terminates at the cavoatrial junction. Right internal jugular Swan-Ganz catheter terminates in a segmental right middle lobe pulmonary artery branch. Left anterior descending and left circumflex coronary atherosclerosis. Atherosclerotic nonaneurysmal thoracic aorta, which is normal in course. Dilated main pulmonary artery (3.5 cm diameter). No discrete thyroid nodules. Unremarkable esophagus. No pathologically enlarged axillary, mediastinal or gross hilar lymph nodes, noting limited sensitivity for the detection of hilar adenopathy on this noncontrast study. Lungs/Pleura: No pneumothorax. Small to moderate right and small left layering simple pleural effusions. Diffuse interlobular septal thickening and ground-glass attenuation in both lungs, consistent with pulmonary edema. Mild-to-moderate compressive atelectasis in the dependent lower lobes bilaterally. Mild symmetric biapical pleural-parenchymal scarring. Medial apical left upper lobe 6 mm solid pulmonary nodule (series 205/image 21). No additional significant pulmonary nodules or lung masses in the aerated portions of the lungs. Musculoskeletal: No aggressive appearing focal osseous lesions. Mild spondylosis in the thoracic spine. CT ABDOMEN AND PELVIS Hepatobiliary: Normal  liver with no liver mass. Nondistended gallbladder contains layering tiny subcentimeter calcified gallstones with no gallbladder wall thickening or pericholecystic fluid. No biliary ductal dilatation. Pancreas: Normal, with no mass or duct dilation. Spleen: Normal size. No mass. 2.7 cm splenule medial to the spleen in the left upper quadrant. Adrenals/Urinary Tract: Normal adrenals. There are several (greater than 5 on the right and at least 5 on the left) nonobstructing stones in both kidneys measuring up to the 4 mm in the lower right kidney and measuring up to 3 mm in the interpolar left kidney. No hydronephrosis. Simple 3.7 cm exophytic lateral interpolar simple right renal cyst. No additional contour deforming renal lesions. Normal bladder. Stomach/Bowel: Grossly normal stomach. Normal caliber small bowel with no small bowel wall thickening. Appendectomy. Mild diverticulosis of the proximal sigmoid colon, with no large bowel wall thickening or pericolonic fat stranding. Vascular/Lymphatic: Atherosclerotic nonaneurysmal abdominal aorta. No pathologically enlarged lymph nodes in the abdomen or pelvis. Reproductive: Mild to moderate prostatomegaly with nonspecific coarse internal prostatic calcifications. Other: No pneumoperitoneum, ascites or focal fluid collection. Musculoskeletal: No aggressive appearing focal osseous lesions. Healed deformity in the inferior right pubic ramus. Scattered subcentimeter sclerotic lesions in the bilateral pelvic girdle are nonspecific, favor benign bone islands. Mild-to-moderate degenerative changes in the lumbar spine. IMPRESSION: 1. Solitary 6 mm apical left upper lobe pulmonary nodule. Non-contrast chest CT  at 6-12 months is recommended. If the nodule is stable at time of repeat CT, then future CT at 18-24 months (from today's scan) is considered optional for low-risk patients, but is recommended for high-risk patients. This recommendation follows the consensus statement:  Guidelines for Management of Incidental Pulmonary Nodules Detected on CT Images:From the Fleischner Society 2017; published online before print (10.1148/[id number]). 2. Cardiomegaly. Mild pericardial effusion/thickening. Small to moderate right and small left layering simple pleural effusions with associated compressive lower lobe atelectasis. 3. Two vessel coronary atherosclerosis. 4. Dilated main pulmonary artery, suggesting pulmonary arterial hypertension. 5. Cholelithiasis. 6. Mild sigmoid diverticulosis. 7. 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  No hydronephrosis. 8. Prostatomegaly. Electronically Signed   By: Delbert Phenix M.D.   On: 08/24/2015 12:55     Medications:     Scheduled Medications: . calcium-vitamin D   Oral Daily  . enoxaparin (LOVENOX) injection  40 mg Subcutaneous Q24H  . escitalopram  10 mg Oral Daily  . feeding supplement (ENSURE ENLIVE)  237 mL Oral TID BM  . furosemide  80 mg Intravenous Daily  . ketorolac  1 drop Left Eye Daily  . multivitamin with minerals  1 tablet Oral Daily  . pantoprazole  40 mg Oral Daily  . potassium chloride SA  40 mEq Oral Daily  . prednisoLONE acetate  1 drop Left Eye Daily  . psyllium  0.52 g Oral Daily  . sodium chloride flush  10-40 mL Intracatheter Q12H  . sodium chloride flush  10-40 mL Intracatheter Q12H  . sodium chloride flush  3 mL Intravenous Q12H  . vitamin B-12  100 mcg Oral Daily  . vitamin C  500 mg Oral QODAY    Infusions: . amiodarone 30 mg/hr (08/26/15 0612)  . milrinone 0.125 mcg/kg/min (08/25/15 1816)  . norepinephrine (LEVOPHED) Adult infusion 10 mcg/min (08/25/15 1817)    PRN Medications: sodium chloride, acetaminophen, ondansetron (ZOFRAN) IV, polyvinyl alcohol, sodium chloride flush, sodium chloride flush, sodium chloride flush   Assessment:   1. 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 EF 15%. Cath 8/14 with normal cors 2. Frequent PVCs   Plan/Discussion:    Anticipate VAD later this week.    Hemodynamics ok for now. Hold off on IABP. CVP coming down. Continue lasix 80 mg daily. Continue norepi 10 mcg + milrinone 0.125 mcg. Renal function ok.     Length of Stay: 5 Amy Clegg NP-C  08/26/2015, 7:44 AM  Advanced Heart Failure Team Pager [phone] (M-F; 7a - 4p)  Please contact [facility] for night-coverage after hours (4p -7a ) and weekends on [email or web address]  Patient seen and examined with Tonye Becket, NP. We discussed all aspects of the encounter. I agree with the assessment and plan as stated above.   Ernestine Conrad numbers done personally. Hemodynamics and co-ox stable. 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 Continue norepi and milrinone. Will need inpatient LVAD likely Thursday or Friday. D/w Dr. Donata Clay. Likely place IABP on Tuesday or Wednesday. Renal function and electrolytes stable.   Continue with ectopy. Continue IV amio.   The patient is critically ill with multiple organ systems failure and requires high complexity decision making for assessment and support, frequent evaluation and titration of therapies, application of advanced monitoring technologies and extensive interpretation of multiple databases.   Critical Care Time devoted to patient care services described in this note is 35 Minutes.  Elliet Goodnow,MD 8:22 AM

## 2015-08-26 NOTE — Consult Note (Signed)
Aneth met with patient and reviewed HCPOA/Advamced Directive material with pt and wife; please page [facility] [phone] when available.  [place] has arranged Notary and witnesses between 1:30 - 2:00 today, 6/19. Gwynn Burly 11:02 AM

## 2015-08-26 NOTE — Progress Notes (Signed)
Spoke with Cor-trac RN and tube will be placed tomorrow am.

## 2015-08-27 ENCOUNTER — Inpatient Hospital Stay (HOSPITAL_COMMUNITY): Payer: Medicare Other

## 2015-08-27 DIAGNOSIS — E43 Unspecified severe protein-calorie malnutrition: Secondary | ICD-10-CM

## 2015-08-27 LAB — COMPREHENSIVE METABOLIC PANEL
ALT: 42 U/L (ref 17–63)
AST: 40 U/L (ref 15–41)
Albumin: 3.1 g/dL — ABNORMAL LOW (ref 3.5–5.0)
Alkaline Phosphatase: 48 U/L (ref 38–126)
Anion gap: 7 (ref 5–15)
BUN: 15 mg/dL (ref 6–20)
CO2: 29 mmol/L (ref 22–32)
Calcium: 9.1 mg/dL (ref 8.9–10.3)
Chloride: 100 mmol/L — ABNORMAL LOW (ref 101–111)
Creatinine, Ser: 1.07 mg/dL (ref 0.61–1.24)
GFR calc Af Amer: >60 mL/min (ref >60)
GFR calc non Af Amer: >60 mL/min (ref >60)
Glucose, Bld: 154 mg/dL — ABNORMAL HIGH (ref 65–99)
Potassium: 4.1 mmol/L (ref 3.5–5.1)
Sodium: 136 mmol/L (ref 135–145)
Total Bilirubin: 1.2 mg/dL (ref 0.3–1.2)
Total Protein: 5.8 g/dL — ABNORMAL LOW (ref 6.5–8.1)

## 2015-08-27 LAB — CARBOXYHEMOGLOBIN
CARBOXYHEMOGLOBIN: 1.4 % (ref 0.5–1.5)
METHEMOGLOBIN: 0.7 % (ref 0.0–1.5)
O2 SAT: 61.8 %
TOTAL HEMOGLOBIN: 13.2 g/dL — AB (ref 13.5–18.0)

## 2015-08-27 LAB — PREALBUMIN: Prealbumin: 14.7 mg/dL — ABNORMAL LOW (ref 18–38)

## 2015-08-27 MED ORDER — JEVITY 1.5 CAL PO LIQD: 237.0000 mL | ORAL | Status: DC

## 2015-08-27 MED ORDER — JEVITY 1.5 CAL/FIBER PO LIQD
1000.0000 mL | ORAL | Status: DC
  Administered 2015-08-28 – 2015-08-29 (×2): 1000 mL
  Filled 2015-08-27 (×5): qty 1000

## 2015-08-27 NOTE — Progress Notes (Signed)
Introducer removed at this time per MD Bensihmon. Pt tolerarted well. Manual pressure held for 15 minutes. No active bleeding, pressure dressing applied.

## 2015-08-27 NOTE — Progress Notes (Signed)
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 Normal cors. EF 10% with low cardiac output.   Started on norepi due to persistent hypotension. Remains on norepi 10 mcg and Milrinone 0.125. Amio started for frequent PVCs.   Complaining of fatigue. Denies SOB.    Co-ox 62%  Swan numbers RA 1 PA 29/21 PCWP 24 CO/CI 4.2/2.1 SVR 1538  Objective:   Weight Range:  Vital Signs:   Temp:  [97.7 F (36.5 C)-98.8 F (37.1 C)] 97.7 F (36.5 C) (06/20 0715) Pulse Rate:  [51-69] 51 (06/20 0715) Resp:  [15-30] 16 (06/20 0715) BP: (85-115)/(56-95) 108/78 mmHg (06/20 0715) SpO2:  [94 %-99 %] 95 % (06/20 0715) Weight:  [166 lb 1.6 oz (75.342 kg)] 166 lb 1.6 oz (75.342 kg) (06/20 0500) Last BM Date: 08/23/15  Weight change: Filed Weights   08/25/15 0500 08/26/15 0500 08/27/15 0500  Weight: 166 lb 3.6 oz (75.4 kg) 165 lb 8 oz (75.07 kg) 166 lb 1.6 oz (75.342 kg)    Intake/Output:   Intake/Output Summary (Last 24 hours) at 08/27/15 0739 Last data filed at 08/27/15 0700  Gross per 24 hour  Intake 2198.32 ml  Output   1875 ml  Net 323.32 ml     Physical Exam: CVP 5-6   General: Lying in bed NAD. Wife at bedside.  HEENT: normal  Neck: supple. RIJ swan Carotids 2+ bilat; no bruits. No lymphadenopathy or thryomegaly appreciated.  Cor: PMI laterally displaced. Regular rate & rhythm. No murmur. +s3.  Lungs: clear Abdomen: soft, nontender, mildly distended. No hepatosplenomegaly. No bruits or masses. Good bowel sounds.  Extremities: no cyanosis, clubbing, rash, no edema . PICC RUE Neuro: alert & oriented x 3, cranial nerves grossly intact. moves all 4 extremities w/o difficulty. Affect pleasant.  Telemetry:  Sinus with frequent PVCs  Labs: Basic Metabolic Panel:  Recent Labs Lab 08/21/15 1345  08/22/15 1527  08/23/15 0453 08/23/15 0454 08/24/15 0538 08/25/15 0515  08/26/15 0415 08/27/15 0518  NA 137  < >  --   < >  --  139 137 134* 134* 136  K 4.5  < >  --   < >  --  3.3* 3.9 4.1 3.7 4.1  CL 107  < >  --   < >  --  105 103 102 103 100*  CO2 22  < >  --   --   --  27 25 24 26 29   GLUCOSE 117*  < >  --   < >  --  105* 158* 165* 141* 154*  BUN 11  < >  --   < >  --  9 15 17 15 15   CREATININE 1.17  < >  --   < >  --  1.10 1.16 1.12 0.97 1.07  CALCIUM 9.4  < >  --   --   --  8.9 9.1 8.7* 8.3* 9.1  MG 2.1  --  1.9  --  2.0  --   --   --   --   --   < > = values in this interval not displayed.  Liver Function Tests:  Recent Labs Lab 08/21/15 1345 08/27/15 0518  AST 37 40  ALT 36 42  ALKPHOS 43 48  BILITOT 1.7* 1.2  PROT 6.4* 5.8*  ALBUMIN 3.6 3.1*   No results for input(s): LIPASE, AMYLASE in the  last 168 hours. No results for input(s): AMMONIA in the last 168 hours.  CBC:  Recent Labs Lab 08/22/15 0355 08/22/15 2143 08/23/15 0454 08/24/15 0538 08/25/15 0515 08/26/15 0415  WBC 4.6  --  5.4 9.7 9.5 7.4  NEUTROABS 3.3  --  3.9 6.7 6.5 4.5  HGB 11.2* 15.0 12.2* 13.1 12.8* 12.1*  HCT 36.3* 44.0 39.7 43.1 41.1 39.2  MCV 86.0  --  84.3 84.0 83.5 85.2  PLT 114*  --  107* 142* 120* 111*    Cardiac Enzymes: No results for input(s): CKTOTAL, CKMB, CKMBINDEX, TROPONINI in the last 168 hours.  BNP: BNP (last 3 results)  Recent Labs  01/25/15 1128 05/13/15 2248 08/21/15 1345  BNP 886.7* 1235.6* 2132.9*    ProBNP (last 3 results) No results for input(s): PROBNP in the last 8760 hours.    Other results:  Imaging: No results found.   Medications:     Scheduled Medications: . calcium-vitamin D   Oral Daily  . enoxaparin (LOVENOX) injection  40 mg Subcutaneous Q24H  . escitalopram  10 mg Oral Daily  . feeding supplement (ENSURE ENLIVE)  237 mL Oral TID BM  . feeding supplement (PRO-STAT SUGAR FREE 64)  30 mL Per Tube Daily  . furosemide  80 mg Intravenous Daily  . ketorolac  1 drop Left Eye Daily  . multivitamin with  minerals  1 tablet Oral Daily  . pantoprazole  40 mg Oral Daily  . potassium chloride SA  40 mEq Oral Daily  . prednisoLONE acetate  1 drop Left Eye Daily  . psyllium  0.52 g Oral Daily  . sodium chloride flush  10-40 mL Intracatheter Q12H  . sodium chloride flush  10-40 mL Intracatheter Q12H  . sodium chloride flush  3 mL Intravenous Q12H  . vitamin B-12  100 mcg Oral Daily  . vitamin C  500 mg Oral QODAY    Infusions: . amiodarone 30 mg/hr (08/27/15 0537)  . feeding supplement (OSMOLITE 1.5 CAL) Stopped (08/26/15 1700)  . milrinone 0.125 mcg/kg/min (08/27/15 0537)  . norepinephrine (LEVOPHED) Adult infusion 14 mcg/min (08/27/15 0120)    PRN Medications: sodium chloride, acetaminophen, ondansetron (ZOFRAN) IV, polyvinyl alcohol, sodium chloride flush, sodium chloride flush, sodium chloride flush   Assessment:   1. 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 EF 15%. Cath 8/14 with normal cors 2. Frequent PVCs 3. Severe Malnutrition- Prealbumin 14.7 on 6/20   Plan/Discussion:   Anticipate VAD later this week.    CO-OX 62%. Remove swan . Hemodynamics ok for now. Hold off on IABP.  CVP 5.. Continue norepi 10 mcg + milrinone 0.125 mcg. Renal function ok.   Prealbumin better from 3.8>14.7. For cortrack today.   Consult PT. OOB today.     Length of Stay: 6 Amy Clegg NP-C  08/27/2015, 7:39 AM  Advanced Heart Failure Team Pager [phone] (M-F; 7a - 4p)  Please contact [facility] for night-coverage after hours (4p -7a ) and weekends on [email or web address]  Patient seen and examined with Tonye Becket, NP. We discussed all aspects of the encounter. I agree with the assessment and plan as stated above.   Hemodynamics improved on milrinone and norepi. Prealbumin ~15. Will remove swan. Discussed at [facility] and given improved hemodynamics favor getting him out of bed and ambulating prior to VAD rather than IABP. Will focus on nutrition. Now eating better so may be  able to avoid Dobhoff and TFs. Continue current lasix dose. VAD placement of Friday. Renal  function and electrolytes stable.   Ambria Mayfield,MD 4:53 PM

## 2015-08-27 NOTE — Progress Notes (Signed)
Cortrak tube placed but abdominal xray shows it is not post-pyloric. No cortrak approved nurse available at night for tube adjustment. Tube feedings were not started.

## 2015-08-27 NOTE — Progress Notes (Signed)
CSW met with patient, wife and daughter at bedside to offer support. Patient state he is eating and was able to get out of bed and walk the halls today. Patient and family spoke of upcoming surgery and state they are ready. Patient also shared family stories and appears in good spirits. CSW will continue to follow for LVAD implant planned for Friday August 30, 2015. Jackie , LCSW [phone]   

## 2015-08-27 NOTE — Progress Notes (Signed)
Called cortrak RN on 2S. Very busy assignment today, will possibly place cortrak later today.

## 2015-08-27 NOTE — Progress Notes (Signed)
Paged NP Amy Clegg concerning pts cortrak tube. Pt is eating well and drinking ensures. NP stated to leave cortrak out as long as pt is eating.

## 2015-08-27 NOTE — Progress Notes (Signed)
Swann removed at this time. Balloon was deflated prior to removal. No complications

## 2015-08-27 NOTE — Progress Notes (Signed)
Daily Progress Note   Patient Name: [name]       Date: 08/27/2015 DOB: 08-10-1940  Age: 75 y.o. MRN#: [id number] Attending Physician: Dolores Patty, MD Primary Care Physician: Donzetta Sprung, MD Admit Date: 08/21/2015  Reason for Consultation/Follow-up: Psychosocial/spiritual support  Subjective: Patient feeling ok.  No complaints.  Relieved the large catheter is out of his neck.  Talking about walking today.  After meeting briefly with Mr. And [name] I spoke at length with daughter [name].  In our meeting last Friday when discussing advanced directives, [name] stated " I'm going to over rule everybody - we are doing everything to keep my Daddy alive!"  She then insisted that he agree to all measures of life support.    Today I gently broached the subject of the natural order of life and death with [name].  She immediately broke into tears and said when my Daddy dies, I'm going to die too.  I can't be without him, I just can't.    Her uncle walked up and sat down with [place].  He talked about his wife's very sudden passing 2 years ago.  The 3 of [place] were able to talk about the process of dying.  We discussed that a prolonged death on life support, bed bound with recurrent infections / wounds etc... Would be an unacceptable quality of life for any human being.  [name] and her uncle agreed with that.    [name] was then able to say - that she would never want that for her father but she just could not think about his possibly dying right now.  We chatted some more and I committed to them that I would follow along to support them in any way possible.  Length of Stay: 6  Current Medications: Scheduled Meds:  . calcium-vitamin D   Oral Daily  . enoxaparin (LOVENOX) injection  40 mg  Subcutaneous Q24H  . escitalopram  10 mg Oral Daily  . feeding supplement (ENSURE ENLIVE)  237 mL Oral TID BM  . feeding supplement (PRO-STAT SUGAR FREE 64)  30 mL Per Tube Daily  . furosemide  80 mg Intravenous Daily  . ketorolac  1 drop Left Eye Daily  . multivitamin with minerals  1 tablet Oral Daily  . pantoprazole  40 mg Oral Daily  .  potassium chloride SA  40 mEq Oral Daily  . prednisoLONE acetate  1 drop Left Eye Daily  . psyllium  0.52 g Oral Daily  . sodium chloride flush  10-40 mL Intracatheter Q12H  . sodium chloride flush  10-40 mL Intracatheter Q12H  . sodium chloride flush  3 mL Intravenous Q12H  . vitamin B-12  100 mcg Oral Daily  . vitamin C  500 mg Oral QODAY    Continuous Infusions: . amiodarone 30 mg/hr (08/27/15 0800)  . feeding supplement (OSMOLITE 1.5 CAL) Stopped (08/26/15 1700)  . milrinone 0.125 mcg/kg/min (08/27/15 0800)  . norepinephrine (LEVOPHED) Adult infusion 14 mcg/min (08/27/15 0800)    PRN Meds: sodium chloride, acetaminophen, ondansetron (ZOFRAN) IV, polyvinyl alcohol, sodium chloride flush, sodium chloride flush, sodium chloride flush  Physical Exam    Thin well developed male, NAD, Awake and alert with clear speech.  Wife and daughter at bedside. Resp not labored. Extremities no edema.       Vital Signs: BP 94/57 mmHg  Pulse 51  Temp(Src) 97.9 F (36.6 C) (Oral)  Resp 20  Ht  (1.88 m)  Wt 75.342 kg (166 lb 1.6 oz)  BMI 21.32 kg/m2  SpO2 99% SpO2: SpO2: 99 % O2 Device: O2 Device: Not Delivered O2 Flow Rate: O2 Flow Rate (L/min): 2 L/min  Intake/output summary:  Intake/Output Summary (Last 24 hours) at 08/27/15 1035 Last data filed at 08/27/15 0900  Gross per 24 hour  Intake 1888.12 ml  Output   1875 ml  Net  13.12 ml   LBM: Last BM Date: 08/23/15 Baseline Weight: Weight: 80.287 kg (177 lb) Most recent weight: Weight: 75.342 kg (166 lb 1.6 oz)       Palliative Assessment/Data:    Flowsheet Rows        Most Recent  Value   Intake Tab    Referral Department  Cardiology   Unit at Time of Referral  Intermediate Care Unit   Palliative Care Primary Diagnosis  Cardiac   Date Notified  08/23/15   Palliative Care Type  New Palliative care   Reason for referral  Other (Comment)   Date of Admission  08/21/15   Date first seen by Palliative Care  08/23/15   # of days Palliative referral response time  0 Day(s)   # of days IP prior to Palliative referral  2   Clinical Assessment    Palliative Performance Scale Score  50%   Psychosocial & Spiritual Assessment    Palliative Care Outcomes    Patient/Family meeting held?  Yes   Who was at the meeting?  patient, wife, daughters [name] and [name]   Palliative Care Outcomes  Other (Comment)   Patient/Family wishes: Interventions discontinued/not started   Tube feedings/TPN, PEG      Patient Active Problem List   Diagnosis Date Noted  . Protein-calorie malnutrition, severe 08/23/2015  . Palliative care encounter   . Goals of care, counseling/discussion   . Acute on chronic systolic (congestive) heart failure (HCC) 08/21/2015  . Acute on chronic systolic CHF (congestive heart failure) (HCC) 08/21/2015  . PVC (premature ventricular contraction) 01/04/2015  . 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 Symptomatic bradycardia 11/28/2012  . 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 Chronic systolic heart failure (HCC) 10/27/2012  . 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 Left bundle branch block 10/06/2012  . Syncope 10/06/2012    Palliative Care Assessment & Plan   Patient Profile:  75 y.o. 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 He is undergoing preparation this week for an LVAD placement.  He is currently on milrinone gtt and levophed.  A cortrak tube is being  placed today in order to increase his nutritional status prior to surgery.  He is currently eating very little, but albumin is 3.1  Assessment: [name] appears stable and in good spirits.  Recommendations/Plan:  Full code.  HCPOA completed.    Goals of Care and Additional Recommendations:  Limitations on Scope of Treatment: Full Scope Treatment  Code Status: Full Code  Prognosis:   Unable to determine   Discharge Planning:  Home with Home Health  Care plan was discussed with brother and daughter [name].  Thank you for allowing the Palliative Medicine Team to assist in the care of this patient.   Time In: 11:00  11:45 Total Time 45 Prolonged Time Billed no      Greater than 50%  of this time was spent counseling and coordinating care related to the above assessment and plan.  Algis Downs, PA-C Palliative Medicine Pager: [phone]  Please contact Palliative Medicine Team phone at [phone] for questions and concerns.

## 2015-08-28 ENCOUNTER — Inpatient Hospital Stay (HOSPITAL_COMMUNITY): Payer: Medicare Other

## 2015-08-28 DIAGNOSIS — G4700 Insomnia, unspecified: Secondary | ICD-10-CM

## 2015-08-28 LAB — GLUCOSE, CAPILLARY
GLUCOSE-CAPILLARY: 317 mg/dL — AB (ref 65–99)
GLUCOSE-CAPILLARY: 212 mg/dL — AB (ref 65–99)

## 2015-08-28 LAB — CARBOXYHEMOGLOBIN
Carboxyhemoglobin: 1.4 % (ref 0.5–1.5)
METHEMOGLOBIN: 0.9 % (ref 0.0–1.5)
O2 SAT: 65.8 %
TOTAL HEMOGLOBIN: 12.5 g/dL — AB (ref 13.5–18.0)

## 2015-08-28 LAB — COMPREHENSIVE METABOLIC PANEL
ALT: 38 U/L (ref 17–63)
AST: 33 U/L (ref 15–41)
Albumin: 2.9 g/dL — ABNORMAL LOW (ref 3.5–5.0)
Alkaline Phosphatase: 46 U/L (ref 38–126)
Anion gap: 7 (ref 5–15)
BUN: 19 mg/dL (ref 6–20)
CO2: 29 mmol/L (ref 22–32)
Calcium: 9.1 mg/dL (ref 8.9–10.3)
Chloride: 98 mmol/L — ABNORMAL LOW (ref 101–111)
Creatinine, Ser: 1.13 mg/dL (ref 0.61–1.24)
GFR calc Af Amer: >60 mL/min (ref >60)
GFR calc non Af Amer: >60 mL/min (ref >60)
Glucose, Bld: 228 mg/dL — ABNORMAL HIGH (ref 65–99)
Potassium: 4.1 mmol/L (ref 3.5–5.1)
Sodium: 134 mmol/L — ABNORMAL LOW (ref 135–145)
Total Bilirubin: 1.1 mg/dL (ref 0.3–1.2)
Total Protein: 5.5 g/dL — ABNORMAL LOW (ref 6.5–8.1)

## 2015-08-28 LAB — PROTIME-INR
INR: 1.3 (ref 0.00–1.49)
Prothrombin Time: 16.3 seconds — ABNORMAL HIGH (ref 11.6–15.2)

## 2015-08-28 LAB — APTT: aPTT: 39 seconds — ABNORMAL HIGH (ref 24–37)

## 2015-08-28 MED ORDER — ZOLPIDEM TARTRATE 5 MG PO TABS: 5.0000 mg | ORAL_TABLET | Freq: Every evening | ORAL | Status: DC | PRN

## 2015-08-28 MED ORDER — PHENOL 1.4 % MT LIQD: 1.0000 | OROMUCOSAL | Status: DC | PRN

## 2015-08-28 MED ORDER — INSULIN ASPART 100 UNIT/ML ~~LOC~~ SOLN
0.0000 [IU] | Freq: Three times a day (TID) | SUBCUTANEOUS | Status: DC
  Administered 2015-08-28: 3 [IU] via SUBCUTANEOUS
  Administered 2015-08-28: 7 [IU] via SUBCUTANEOUS

## 2015-08-28 MED ORDER — INSULIN ASPART 100 UNIT/ML ~~LOC~~ SOLN
0.0000 [IU] | Freq: Every day | SUBCUTANEOUS | Status: DC
  Administered 2015-08-28: 2 [IU] via SUBCUTANEOUS

## 2015-08-28 MED ORDER — SORBITOL 70 % PO SOLN
60.0000 mL | Freq: Once | ORAL | Status: AC
  Administered 2015-08-28: 60 mL via ORAL
  Filled 2015-08-28: qty 60

## 2015-08-28 NOTE — Progress Notes (Signed)
CSW met at bedside with patient's wife. Patient resting soundly. Wife reports she got some sleep last night and considering going home tonight with daughter staying with patient. Wife spoke at length about patient's level of comfort with family present to provide support. Wife stated he had a difficult time last night with feeding tube but wife and RN were able to settle patient down and he finally rested for a bit. CSW spoke about need for wife to rest and gain strength for caregiving role post surgery. Wife verbalizes understanding and hopeful to share role of staying with patient with both daughters to allow her some rest. CSW will continue to follow for support throughout recovery. Raquel Sarna, LCSW [phone]

## 2015-08-28 NOTE — Progress Notes (Signed)
Daily Progress Note   Patient Name: [name]       Date: 08/28/2015 DOB: 1940/07/29  Age: 75 y.o. MRN#: [id number] Attending Physician: Dolores Patty, MD Primary Care Physician: Donzetta Sprung, MD Admit Date: 08/21/2015  Reason for Consultation/Follow-up: Psychosocial/spiritual support  Subjective: Patient uncomfortable due to N/G.  Complains that he couldn't sleep last night because of it.  Family states at home when he couldn't sleep he used to use Ambien.  Benedryl makes him hyper.  No bowel mvmt yesterday or today.  On psyllium and had a dose of sorbitol.  Wife Carney Bern) complaining about small ants in the window.  Length of Stay: 7  Current Medications: Scheduled Meds:  . calcium-vitamin D   Oral Daily  . enoxaparin (LOVENOX) injection  40 mg Subcutaneous Q24H  . escitalopram  10 mg Oral Daily  . feeding supplement (ENSURE ENLIVE)  237 mL Oral TID BM  . feeding supplement (PRO-STAT SUGAR FREE 64)  30 mL Per Tube Daily  . furosemide  80 mg Intravenous Daily  . insulin aspart  0-5 Units Subcutaneous QHS  . insulin aspart  0-9 Units Subcutaneous TID WC  . ketorolac  1 drop Left Eye Daily  . multivitamin with minerals  1 tablet Oral Daily  . pantoprazole  40 mg Oral Daily  . potassium chloride SA  40 mEq Oral Daily  . prednisoLONE acetate  1 drop Left Eye Daily  . psyllium  0.52 g Oral Daily  . sodium chloride flush  10-40 mL Intracatheter Q12H  . sorbitol  60 mL Oral Once  . vitamin B-12  100 mcg Oral Daily  . vitamin C  500 mg Oral QODAY    Continuous Infusions: . amiodarone 30 mg/hr (08/28/15 0615)  . feeding supplement (JEVITY 1.5 CAL/FIBER) 1,000 mL (08/28/15 0800)  . milrinone 0.125 mcg/kg/min (08/28/15 1138)  . norepinephrine (LEVOPHED) Adult infusion 14  mcg/min (08/28/15 1139)    PRN Meds: acetaminophen, ondansetron (ZOFRAN) IV, phenol, polyvinyl alcohol, sodium chloride flush, zolpidem  Physical Exam    Thin well developed male, NAD, Awake and alert with clear speech.  Appears weak and fatigued today. Resp not labored. Extremities no edema.       Vital Signs: BP 114/68 mmHg  Pulse 70  Temp(Src) 98.2 F (36.8 C) (Oral)  Resp 31  Ht  (1.88 m)  Wt 75.751 kg (167 lb)  BMI 21.43 kg/m2  SpO2 99% SpO2: SpO2: 99 % O2 Device: O2 Device: Not Delivered O2 Flow Rate: O2 Flow Rate (L/min): 2 L/min  Intake/output summary:   Intake/Output Summary (Last 24 hours) at 08/28/15 1250 Last data filed at 08/28/15 1200  Gross per 24 hour  Intake   2127 ml  Output   2125 ml  Net      2 ml   LBM: Last BM Date: 08/23/15 Baseline Weight: Weight: 80.287 kg (177 lb) Most recent weight: Weight: 75.751 kg (167 lb)       Palliative Assessment/Data:    Flowsheet Rows        Most Recent Value   Intake Tab    Referral Department  Cardiology   Unit at Time of Referral  Intermediate Care Unit   Palliative Care Primary Diagnosis  Cardiac   Date Notified  08/23/15   Palliative Care Type  New Palliative care   Reason for referral  Other (Comment)   Date of Admission  08/21/15   Date first seen by Palliative Care  08/23/15   # of days Palliative referral response time  0 Day(s)   # of days IP prior to Palliative referral  2   Clinical Assessment    Palliative Performance Scale Score  50%   Psychosocial & Spiritual Assessment    Palliative Care Outcomes    Patient/Family meeting held?  Yes   Who was at the meeting?  patient, wife, daughters Bjorn Loser and A[place]   Palliative Care Outcomes  Other (Comment)   Patient/Family wishes: Interventions discontinued/not started   Tube feedings/TPN, PEG      Patient Active Problem List   Diagnosis Date Noted  . Protein-calorie malnutrition, severe 08/23/2015  . Palliative care encounter   .  Goals of care, counseling/discussion   . Acute on chronic systolic (congestive) heart failure (HCC) 08/21/2015  . Acute on chronic systolic CHF (congestive heart failure) (HCC) 08/21/2015  . PVC (premature ventricular contraction) 01/04/2015  . 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 Symptomatic bradycardia 11/28/2012  . 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 Chronic systolic heart failure (HCC) 10/27/2012  . 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 Left bundle branch block 10/06/2012  . Syncope 10/06/2012    Palliative Care Assessment & Plan   Patient Profile:  75 y.o. 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 He is undergoing preparation this week for an LVAD placement.  He is currently on milrinone gtt and levophed.  A cortrak tube has been placed in order to give Jevity and increase his nutritional status prior to surgery.  He is currently eating very little, but albumin is 3.1  Assessment: Mr. Nabers appears weak and fatigued today.  Sitting up in chair.  States he walked 3 laps around the floor yesterday.  Recommendations/Plan:  Full code.  HCPOA completed and notarized  Goals of Care and Additional Recommendations:  Limitations on Scope of Treatment: Full Scope Treatment  Code Status: Full Code  Prognosis:   Unable to determine   Discharge Planning:  Home with Home Health  Care plan was discussed with brother and daughter Bjorn Loser.  Thank you for allowing the Palliative Medicine Team to assist in the care of this patient.  Time In: 11:00  11:15 Total Time 15 Prolonged Time Billed no      Greater than 50%  of this time was spent counseling and coordinating care related to the above assessment and plan.  Algis Downs, PA-C Palliative Medicine Pager:  [phone]  Please contact Palliative Medicine Team phone at [phone] for questions and concerns.
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
## 2015-08-28 NOTE — Progress Notes (Signed)
CARDIAC REHAB PHASE I   PRE:  Rate/Rhythm: 64 pacing with PVCs    BP: sitting 103/61    SaO2: 97 RA  MODE:  Ambulation: 750 ft   POST:  Rate/Rhythm: 84 pacing with PVCs    BP: sitting 105/74     SaO2: 97 RA  Pt in bed, willing to walk. Discussed upcoming sternal precautions and encouraged pt to move to EOB without using his arms. This was difficult for him. Assisted pt with pad and demonstrated using his hips to move forward. He also struggled to stand without his arms. I raised bed and he was able to stand with rocking momentum and assist with gait belt. Pt wanted to push IV pole. He was slightly off balance at times, esp on turns. His cognition seemed slow at times, difficulty processing the next steps. He did better on straight aways in hall. Did not do 6 min walk test as we had discussed due to pt stopping suddenly to talk and look around. Hopefully he can be more focused and purposeful tomorrow. To bed after walk, pt exhausted. Discussed sternal precautions, gave pt IS (1700 mL), discussed mobility post op with pt and wife. Pt sts he plans to drive home from hospital. Discussed the fact that he will not be driving for a while. Driving is important to him.  Will f/u tomorrow. [id number]  Harriet Masson CES, ACSM 08/28/2015 2:54 PM

## 2015-08-28 NOTE — Progress Notes (Signed)
Nutrition Follow-up  DOCUMENTATION CODES:   Severe malnutrition in context of chronic illness  INTERVENTION:   Jevity 1.5 @ 50 ml/hr  30 ml Prostat daily  Provides: 1900 kcal, 91 grams protein, and 912 ml H2O.   NUTRITION DIAGNOSIS:   Malnutrition related to chronic illness as evidenced by severe depletion of body fat, severe depletion of muscle mass, energy intake < or equal to 75% for > or equal to 1 month. Ongoing.   GOAL:   Patient will meet greater than or equal to 90% of their needs Progressing.   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 Normal cors. EF 10% with low cardiac output. Has diuresed 20 pounds. Feels better. Co-ox remains low. Still with frequent ectopy   6/20 Cortrak place but not post pyloric 6/21 MD ok with Cortrak not post pyloric, Jevity 1.5 @ 50 ml/hr started Provides: 1800 kcal, 76 grams protein, and 912 ml H2O.   Labs reviewed: Na 134 CBG: 317 Medications reviewed and include: vitamin B 12, MVI, K-dur, oscal with D Plan for LVAD Friday.   Meal completion 25% Pt started trying to eat/drink more yesterday after conversation with surgeon. Pt did drink 3 ensure enlive supplements 6/20: 1050 kcal, 60 grams protein. Unclear if he will continue this, pt has no appetite.   Diet Order:  Diet Heart Room service appropriate?: Yes; Fluid consistency:: Thin  Skin:  Reviewed, no issues  Last BM:  6/16  Height:   Ht Readings from Last 1 Encounters:  08/21/15 6\' 2"  (1.88 m)    Weight:   Wt Readings from Last 1 Encounters:  08/28/15 167 lb (75.751 kg)    Ideal Body Weight:  86.36 kg  BMI:  Body mass index is 21.43 kg/(m^2).  Estimated Nutritional Needs:   Kcal:  2000-2200  Protein:  100-115 grams  Fluid:  Per MD  EDUCATION NEEDS:   No education needs identified at this time  Kendell Bane RD, LDN,  CNSC [phone] Pager [phone] After Hours Pager

## 2015-08-28 NOTE — Progress Notes (Signed)
6 Days Post-Op Procedure(s) (LRB): Right/Left Heart Cath and Coronary Angiography (N/A) Subjective: Resting after walk in hallway TF jevity 1.5 50 cc/hr On mod dose norepi for BP support VAD implant Fri am Objective: Vital signs in last 24 hours: Temp:  [97.7 F (36.5 C)-98.4 F (36.9 C)] 98 F (36.7 C) (06/21 0800) Pulse Rate:  [35-70] 61 (06/21 0800) Cardiac Rhythm:  [-] A-V Sequential paced (06/21 0800) Resp:  [13-30] 14 (06/21 0800) BP: (91-115)/(57-79) 105/72 mmHg (06/21 0800) SpO2:  [92 %-99 %] 98 % (06/21 0800) Weight:  [167 lb (75.751 kg)] 167 lb (75.751 kg) (06/21 0500)  Hemodynamic parameters for last 24 hours: CVP:  [9 mmHg-10 mmHg] 9 mmHg  Intake/Output from previous day: 06/20 0701 - 06/21 0700 In: 1677 [P.O.:717; I.V.:960] Out: 2450 [Urine:2450] Intake/Output this shift: Total I/O In: 212.5 [P.O.:180; I.V.:32.5] Out: -        Exam    General- alert and comfortable   Lungs- clear without rales, wheezes   Cor- regular rate and rhythm, no murmur , gallop   Abdomen- soft, non-tender   Extremities - warm, non-tender, minimal edema   Neuro- oriented, appropriate, no focal weakness   Lab Results:  Recent Labs  08/26/15 0415  WBC 7.4  HGB 12.1*  HCT 39.2  PLT 111*   BMET:  Recent Labs  08/27/15 0518 08/28/15 0508  NA 136 134*  K 4.1 4.1  CL 100* 98*  CO2 29 29  GLUCOSE 154* 228*  BUN 15 19  CREATININE 1.07 1.13  CALCIUM 9.1 9.1    PT/INR:  Recent Labs  08/28/15 0825  LABPROT 16.3*  INR 1.30   ABG    Component Value Date/Time   PHART 7.513* 08/22/2015 1820   HCO3 24.2* 08/22/2015 1820   TCO2 27 08/22/2015 2143   ACIDBASEDEF 1.0 08/22/2015 0945   O2SAT 65.8 08/28/2015 0500   CBG (last 3)  No results for input(s): GLUCAP in the last 72 hours.  Assessment/Plan: S/P Procedure(s) (LRB): Right/Left Heart Cath and Coronary Angiography (N/A) Caloric support with TF for moderate protein malnutrition VAD 6-23   LOS: 7 days     Kathlee Nations Trigt III 08/28/2015

## 2015-08-28 NOTE — Evaluation (Signed)
Physical Therapy Evaluation Patient Details Name: [name] MRN: [id number] DOB: 22-Feb-1941 Today's Date: 08/28/2015   History of Present Illness  Pt is a 75 y/o M who was admitted on 08/21/2015 from the [facility] Failure [facility] with acute on chronic systolic heart failure. He is undergoing preparation this week for an LVAD placement (scheduled for 08/30/15). Pt's PMH inlcudes frequent PVCs, nonischemic cardiomyopathy, automatic ICD.    Clinical Impression  Pt admitted with above diagnosis. Pt currently with functional limitations due to the deficits listed below (see PT Problem List). Mr. Zentner was Ind and working full-time PTA.  He demonstrates instability w/ higher level balance activities, requiring min assist to steady.  Will continue to follow pt post-op to address these balance deficits and to continue education on sternal precautions w/ functional mobility. Pt will benefit from skilled PT to increase their independence and safety with mobility to allow discharge to the venue listed below.      Follow Up Recommendations No PT follow up;Supervision for mobility/OOB    Equipment Recommendations  None recommended by PT    Recommendations for Other Services OT consult (after surgery)     Precautions / Restrictions Precautions Precautions: Sternal (once post-op) Precaution Comments: h/o frequent PVCs; provided pt w/ and reviewed sternal precaution handout Restrictions Weight Bearing Restrictions: No      Mobility  Bed Mobility Overal bed mobility: Needs Assistance Bed Mobility: Supine to Sit     Supine to sit: Min guard;HOB elevated     General bed mobility comments: No physical assist needed; however pt requires increased time to scoot to EOB.  HOB elevated.  Transfers Overall transfer level: Needs assistance Equipment used: 1 person hand held assist Transfers: Sit to/from Stand Sit to Stand: Min assist         General transfer comment: Pt reaches out for  PTs hand during sit>stand, assist provided to steady.  Once standing EOB does not require UE support.  Ambulation/Gait Ambulation/Gait assistance: Min assist Ambulation Distance (Feet): 900 Feet Assistive device: None Gait Pattern/deviations: Step-through pattern;Drifts right/left;Decreased stride length   Gait velocity interpretation: Below normal speed for age/gender General Gait Details: Pt drifts to Rt (suspect because IV pole on Lt).  Min assist to steady w/ high level balance activities (specifically stepping over object and turns)  Information systems manager Rankin (Stroke Patients Only)       Balance Overall balance assessment: Needs assistance Sitting-balance support: No upper extremity supported;Feet supported Sitting balance-Leahy Scale: Good     Standing balance support: No upper extremity supported;During functional activity Standing balance-Leahy Scale: Fair               High level balance activites: Backward walking;Direction changes;Turns;Sudden stops;Head turns;Other (comment) (marching while ambulating, stepping over object) High Level Balance Comments: Instability and min assist provided w/ stepping over object and turns Standardized Balance Assessment Standardized Balance Assessment : Dynamic Gait Index   Dynamic Gait Index Level Surface: Mild Impairment (dec gait speed) Change in Gait Speed: Normal Gait with Horizontal Head Turns: Mild Impairment Gait with Vertical Head Turns: Mild Impairment Step Over Obstacle: Severe Impairment       Pertinent Vitals/Pain Pain Assessment: 0-10 Pain Score: 3  Pain Location: Bil knees (h/o OA) Pain Descriptors / Indicators: Aching Pain Intervention(s): Limited activity within patient's tolerance;Monitored during session    Home Living Family/patient expects to be discharged to:: Private residence Living Arrangements: Spouse/significant other  Available Help at Discharge:  Family;Available 24 hours/day Type of Home: House Home Access: Stairs to enter (ramp available if needed) Entrance Stairs-Rails: Can reach both;Left;Right Entrance Stairs-Number of Steps: 3 Home Layout: Two level;Able to live on main level with bedroom/bathroom Home Equipment: Shower seat - built in;Other (comment) (2 built in shower seats) Additional Comments: Pt has almost every home equipment imaginable as 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  Was beginning to feel SOB after ambulating ~300-400 ft.     Hand Dominance        Extremity/Trunk Assessment   Upper Extremity Assessment: Overall WFL for tasks assessed           Lower Extremity Assessment: Overall WFL for tasks assessed         Communication   Communication: No difficulties  Cognition Arousal/Alertness: Awake/alert Behavior During Therapy: WFL for tasks assessed/performed Overall Cognitive Status: Within Functional Limits for tasks assessed                      General Comments General comments (skin integrity, edema, etc.): VSS.  Wife and daughter (also a PT) present during eval.    Exercises General Exercises - Lower Extremity Ankle Circles/Pumps: AROM;Both;10 reps;Seated      Assessment/Plan    PT Assessment Patient needs continued PT services  PT Diagnosis Difficulty walking   PT Problem List Decreased balance;Decreased safety awareness;Decreased knowledge of precautions  PT Treatment Interventions DME instruction;Gait training;Stair training;Functional mobility training;Therapeutic activities;Therapeutic exercise;Balance training;Patient/family education   PT Goals (Current goals can be found in the Care Plan section) Acute Rehab PT Goals Patient Stated Goal: to go home and get back to work PT Goal Formulation: With patient/family Time For Goal Achievement:  09/11/15 Potential to Achieve Goals: Good    Frequency Min 3X/week   Barriers to discharge        Co-evaluation               End of Session Equipment Utilized During Treatment: Gait belt Activity Tolerance: Patient tolerated treatment well Patient left: in chair;with call bell/phone within reach;with chair alarm set;with family/visitor present Nurse Communication: Mobility status         Time: 8657-8469 PT Time Calculation (min) (ACUTE ONLY): 42 min   Charges:   PT Evaluation $PT Eval Low Complexity: 1 Procedure PT Treatments $Gait Training: 23-37 mins   PT G Codes:       Encarnacion Chu PT, DPT  Pager: [phone] Phone: [phone] 08/28/2015, 1:04 PM

## 2015-08-28 NOTE — Progress Notes (Signed)
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 Normal cors. EF 10% with low cardiac output.   Started on norepi due to persistent hypotension. Remains on norepi 14 mcg and Milrinone 0.125. Amio started for frequent PVCs. COr-track placed for night time tube feeds.   Complaining of fatigue.    Co-ox 66%  Objective:   Weight Range:  Vital Signs:   Temp:  [97.7 F (36.5 C)-98.4 F (36.9 C)] 97.8 F (36.6 C) (06/21 0015) Pulse Rate:  [35-70] 35 (06/21 0600) Resp:  [13-30] 18 (06/21 0600) BP: (91-131)/(57-97) 99/71 mmHg (06/21 0600) SpO2:  [92 %-99 %] 95 % (06/21 0600) Weight:  [167 lb (75.751 kg)] 167 lb (75.751 kg) (06/21 0500) Last BM Date: 08/23/15  Weight change: Filed Weights   08/26/15 0500 08/27/15 0500 08/28/15 0500  Weight: 165 lb 8 oz (75.07 kg) 166 lb 1.6 oz (75.342 kg) 167 lb (75.751 kg)    Intake/Output:   Intake/Output Summary (Last 24 hours) at 08/28/15 0744 Last data filed at 08/28/15 0700  Gross per 24 hour  Intake   1677 ml  Output   2450 ml  Net   -773 ml     Physical Exam: CVP 5-6   General: Lying in bed NAD. Wife at bedside.  HEENT: normal  Neck: supple. Carotids 2+ bilat; no bruits. No lymphadenopathy or thryomegaly appreciated.  Cor: PMI laterally displaced. Regular rate & rhythm. No murmur. +s3.  Lungs: clear Abdomen: soft, nontender, mildly distended. No hepatosplenomegaly. No bruits or masses. Good bowel sounds.  Extremities: no cyanosis, clubbing, rash, no edema . PICC RUE Neuro: alert & oriented x 3, cranial nerves grossly intact. moves all 4 extremities w/o difficulty. Affect pleasant.  Telemetry:  Sinus with frequent PVCs  Labs: Basic Metabolic Panel:  Recent Labs Lab 08/21/15 1345  08/22/15 1527  08/23/15 0453  08/24/15 0538 08/25/15 0515 08/26/15 0415 08/27/15 0518 08/28/15 0508  NA 137  < >  --   < >  --   < >  137 134* 134* 136 134*  K 4.5  < >  --   < >  --   < > 3.9 4.1 3.7 4.1 4.1  CL 107  < >  --   < >  --   < > 103 102 103 100* 98*  CO2 22  < >  --   --   --   < > 25 24 26 29 29   GLUCOSE 117*  < >  --   < >  --   < > 158* 165* 141* 154* 228*  BUN 11  < >  --   < >  --   < > 15 17 15 15 19   CREATININE 1.17  < >  --   < >  --   < > 1.16 1.12 0.97 1.07 1.13  CALCIUM 9.4  < >  --   --   --   < > 9.1 8.7* 8.3* 9.1 9.1  MG 2.1  --  1.9  --  2.0  --   --   --   --   --   --   < > = values in this interval not displayed.  Liver Function Tests:  Recent Labs Lab 08/21/15 1345 08/27/15 0518 08/28/15 0508  AST 37 40 33  ALT 36 42 38  ALKPHOS 43 48 46  BILITOT 1.7* 1.2 1.1  PROT 6.4* 5.8* 5.5*  ALBUMIN 3.6 3.1* 2.9*   No results for input(s): LIPASE, AMYLASE in the last 168 hours. No results for input(s): AMMONIA in the last 168 hours.  CBC:  Recent Labs Lab 08/22/15 0355 08/22/15 2143 08/23/15 0454 08/24/15 0538 08/25/15 0515 08/26/15 0415  WBC 4.6  --  5.4 9.7 9.5 7.4  NEUTROABS 3.3  --  3.9 6.7 6.5 4.5  HGB 11.2* 15.0 12.2* 13.1 12.8* 12.1*  HCT 36.3* 44.0 39.7 43.1 41.1 39.2  MCV 86.0  --  84.3 84.0 83.5 85.2  PLT 114*  --  107* 142* 120* 111*    Cardiac Enzymes: No results for input(s): CKTOTAL, CKMB, CKMBINDEX, TROPONINI in the last 168 hours.  BNP: BNP (last 3 results)  Recent Labs  01/25/15 1128 05/13/15 2248 08/21/15 1345  BNP 886.7* 1235.6* 2132.9*    ProBNP (last 3 results) No results for input(s): PROBNP in the last 8760 hours.    Other results:  Imaging: Dg Abd 1 View  08/27/2015  CLINICAL DATA:  Feeding tube placement.  Initial encounter. EXAM: ABDOMEN - 1 VIEW COMPARISON:  CT of the chest, abdomen and pelvis performed 08/24/2015 FINDINGS: The patient's enteric tube is noted coiling within the stomach, ending at the antrum of the stomach. This will likely progress further with time. The visualized bowel gas pattern is unremarkable. Scattered air  and stool filled loops of colon are seen; no abnormal dilatation of small bowel loops is seen to suggest small bowel obstruction. No free intra-abdominal air is identified, though evaluation for free air is limited on a single supine view. The visualized osseous structures are within normal limits; the sacroiliac joints are unremarkable in appearance. The visualized lung bases are essentially clear. Pacemaker/AICD leads are partially imaged. IMPRESSION: 1. Enteric tube noted coiling within the stomach, ending at the antrum of the stomach. This will likely progress further with time. 2. Unremarkable bowel gas pattern; no free intra-abdominal air seen. Moderate amount of stool noted in the colon. Electronically Signed   By: Roanna Raider M.D.   On: 08/27/2015 21:47     Medications:     Scheduled Medications: . calcium-vitamin D   Oral Daily  . enoxaparin (LOVENOX) injection  40 mg Subcutaneous Q24H  . escitalopram  10 mg Oral Daily  . feeding supplement (ENSURE ENLIVE)  237 mL Oral TID BM  . feeding supplement (PRO-STAT SUGAR FREE 64)  30 mL Per Tube Daily  . furosemide  80 mg Intravenous Daily  . ketorolac  1 drop Left Eye Daily  . multivitamin with minerals  1 tablet Oral Daily  . pantoprazole  40 mg Oral Daily  . potassium chloride SA  40 mEq Oral Daily  . prednisoLONE acetate  1 drop Left Eye Daily  . psyllium  0.52 g Oral Daily  . sodium chloride flush  10-40 mL Intracatheter Q12H  . vitamin B-12  100 mcg Oral Daily  . vitamin C  500 mg Oral QODAY    Infusions: . amiodarone 30 mg/hr (08/28/15 0615)  . feeding supplement (JEVITY 1.5 CAL/FIBER) Stopped (08/27/15 2241)  . milrinone 0.125 mcg/kg/min (08/27/15 2000)  . norepinephrine (LEVOPHED) Adult infusion 14 mcg/min (08/27/15 2000)    PRN Medications: acetaminophen, ondansetron (ZOFRAN) IV, polyvinyl alcohol, sodium chloride flush   Assessment:   1. 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 EF  15%. Cath 8/14 with normal cors 2. Frequent PVCs 3. Severe Malnutrition- Prealbumin 14.7 on  6/20   Plan/Discussion:   Anticipate VAD later this week.    CO-OX 66%. CVP 6-7. Continue norepi 14 mcg + milrinone 0.125 mcg. Renal function ok.   Prealbumin 15. Cortrack placed for tube feeds, to start today.    Consult PT. OOB today.   Length of Stay: 7 Amy Clegg NP-C  08/28/2015, 7:44 AM  Advanced Heart Failure Team Pager [phone] (M-F; 7a - 4p)  Please contact [facility] for night-coverage after hours (4p -7a ) and weekends on [email or web address]  Patient seen with NP, agree with the above note.  Stable clinically with good co-ox on current support.  CVP 7.  Will continue current medical regimen.  Plan for LVAD on Friday.   Needs to walk, will get him up today.   Nutritional level low, starting tube feeds this morning.  He finds the NG tube uncomfortable, we discussed the purpose of the feeds.    Marca Ancona 08/28/2015 8:04 AM

## 2015-08-29 ENCOUNTER — Inpatient Hospital Stay (HOSPITAL_COMMUNITY): Payer: Medicare Other

## 2015-08-29 LAB — BLOOD GAS, ARTERIAL
Acid-Base Excess: 6.9 mmol/L — ABNORMAL HIGH (ref 0.0–2.0)
Bicarbonate: 30.4 mEq/L — ABNORMAL HIGH (ref 20.0–24.0)
Drawn by: 441371
FIO2: 0.21
O2 Saturation: 95.3 %
Patient temperature: 98.6
TCO2: 31.7 mmol/L (ref 0–100)
pCO2 arterial: 40.2 mmHg (ref 35.0–45.0)
pH, Arterial: 7.492 — ABNORMAL HIGH (ref 7.350–7.450)
pO2, Arterial: 74.6 mmHg — ABNORMAL LOW (ref 80.0–100.0)

## 2015-08-29 LAB — URINALYSIS, ROUTINE W REFLEX MICROSCOPIC
Bilirubin Urine: NEGATIVE
Glucose, UA: NEGATIVE mg/dL
Hgb urine dipstick: NEGATIVE
Ketones, ur: 15 mg/dL — AB
Leukocytes, UA: NEGATIVE
Nitrite: NEGATIVE
Protein, ur: NEGATIVE mg/dL
Specific Gravity, Urine: 1.017 (ref 1.005–1.030)
pH: 5.5 (ref 5.0–8.0)

## 2015-08-29 LAB — COMPREHENSIVE METABOLIC PANEL
ALT: 36 U/L (ref 17–63)
AST: 30 U/L (ref 15–41)
Albumin: 2.9 g/dL — ABNORMAL LOW (ref 3.5–5.0)
Alkaline Phosphatase: 55 U/L (ref 38–126)
Anion gap: 6 (ref 5–15)
BUN: 20 mg/dL (ref 6–20)
CO2: 30 mmol/L (ref 22–32)
Calcium: 8.8 mg/dL — ABNORMAL LOW (ref 8.9–10.3)
Chloride: 94 mmol/L — ABNORMAL LOW (ref 101–111)
Creatinine, Ser: 0.95 mg/dL (ref 0.61–1.24)
GFR calc Af Amer: >60 mL/min (ref >60)
GFR calc non Af Amer: >60 mL/min (ref >60)
Glucose, Bld: 316 mg/dL — ABNORMAL HIGH (ref 65–99)
Potassium: 4.1 mmol/L (ref 3.5–5.1)
Sodium: 130 mmol/L — ABNORMAL LOW (ref 135–145)
Total Bilirubin: 1 mg/dL (ref 0.3–1.2)
Total Protein: 5.5 g/dL — ABNORMAL LOW (ref 6.5–8.1)

## 2015-08-29 LAB — CBC
HCT: 38.9 % — ABNORMAL LOW (ref 39.0–52.0)
Hemoglobin: 11.9 g/dL — ABNORMAL LOW (ref 13.0–17.0)
MCH: 25.8 pg — ABNORMAL LOW (ref 26.0–34.0)
MCHC: 30.6 g/dL (ref 30.0–36.0)
MCV: 84.2 fL (ref 78.0–100.0)
Platelets: 124 10*3/uL — ABNORMAL LOW (ref 150–400)
RBC: 4.62 MIL/uL (ref 4.22–5.81)
RDW: 15.6 % — ABNORMAL HIGH (ref 11.5–15.5)
WBC: 8 10*3/uL (ref 4.0–10.5)

## 2015-08-29 LAB — CARBOXYHEMOGLOBIN
Carboxyhemoglobin: 1.5 % (ref 0.5–1.5)
Methemoglobin: 0.8 % (ref 0.0–1.5)
O2 Saturation: 63.3 %
Total hemoglobin: 12.4 g/dL — ABNORMAL LOW (ref 13.5–18.0)

## 2015-08-29 LAB — GLUCOSE, CAPILLARY
GLUCOSE-CAPILLARY: 211 mg/dL — AB (ref 65–99)
Glucose-Capillary: 445 mg/dL — ABNORMAL HIGH (ref 65–99)
Glucose-Capillary: 204 mg/dL — ABNORMAL HIGH (ref 65–99)
Glucose-Capillary: 123 mg/dL — ABNORMAL HIGH (ref 65–99)
Glucose-Capillary: 180 mg/dL — ABNORMAL HIGH (ref 65–99)

## 2015-08-29 LAB — MRSA PCR SCREENING: MRSA by PCR: NEGATIVE

## 2015-08-29 LAB — PREPARE RBC (CROSSMATCH)

## 2015-08-29 MED ORDER — SODIUM CHLORIDE 0.9 % IV SOLN
INTRAVENOUS | Status: DC
  Filled 2015-08-29: qty 2.5

## 2015-08-29 MED ORDER — CHLORHEXIDINE GLUCONATE CLOTH 2 % EX PADS
6.0000 | MEDICATED_PAD | Freq: Once | CUTANEOUS | Status: AC
  Administered 2015-08-29: 6 via TOPICAL

## 2015-08-29 MED ORDER — VASOPRESSIN 20 UNIT/ML IV SOLN
0.0400 [IU]/min | INTRAVENOUS | Status: AC
  Administered 2015-08-30: 0.02 [IU]/min via INTRAVENOUS
  Filled 2015-08-29: qty 2

## 2015-08-29 MED ORDER — DOBUTAMINE IN D5W 4-5 MG/ML-% IV SOLN
2.0000 ug/kg/min | INTRAVENOUS | Status: DC
  Filled 2015-08-29: qty 250

## 2015-08-29 MED ORDER — DEXMEDETOMIDINE HCL IN NACL 400 MCG/100ML IV SOLN
0.1000 ug/kg/h | INTRAVENOUS | Status: AC
  Administered 2015-08-30: .4 ug/kg/h via INTRAVENOUS
  Filled 2015-08-29: qty 100

## 2015-08-29 MED ORDER — INSULIN ASPART 100 UNIT/ML ~~LOC~~ SOLN
0.0000 [IU] | Freq: Three times a day (TID) | SUBCUTANEOUS | Status: DC
  Administered 2015-08-29: 7 [IU] via SUBCUTANEOUS
  Administered 2015-08-29: 3 [IU] via SUBCUTANEOUS

## 2015-08-29 MED ORDER — DEXTROSE 5 % IV SOLN
750.0000 mg | INTRAVENOUS | Status: DC
  Filled 2015-08-29: qty 750

## 2015-08-29 MED ORDER — VASOPRESSIN 20 UNIT/ML IV SOLN
0.0400 [IU]/min | INTRAVENOUS | Status: DC
  Filled 2015-08-29: qty 2

## 2015-08-29 MED ORDER — VANCOMYCIN HCL 10 G IV SOLR
1250.0000 mg | INTRAVENOUS | Status: DC
  Filled 2015-08-29: qty 1250

## 2015-08-29 MED ORDER — EPINEPHRINE HCL 1 MG/ML IJ SOLN
0.0000 ug/min | INTRAVENOUS | Status: DC
  Filled 2015-08-29: qty 4

## 2015-08-29 MED ORDER — BISACODYL 5 MG PO TBEC
5.0000 mg | DELAYED_RELEASE_TABLET | Freq: Once | ORAL | Status: AC
  Administered 2015-08-29: 5 mg via ORAL
  Filled 2015-08-29: qty 1

## 2015-08-29 MED ORDER — DEXTROSE 5 % IV SOLN
0.0000 ug/min | INTRAVENOUS | Status: AC
  Administered 2015-08-30: 16 ug/min via INTRAVENOUS
  Filled 2015-08-29: qty 4

## 2015-08-29 MED ORDER — CEFUROXIME SODIUM 1.5 G IJ SOLR
1.5000 g | INTRAMUSCULAR | Status: AC
  Administered 2015-08-30: 1.5 g via INTRAVENOUS
  Administered 2015-08-30: .75 g via INTRAVENOUS
  Filled 2015-08-29: qty 1.5

## 2015-08-29 MED ORDER — DOBUTAMINE IN D5W 4-5 MG/ML-% IV SOLN: 2.0000 ug/kg/min | INTRAVENOUS | Status: DC

## 2015-08-29 MED ORDER — SODIUM CHLORIDE 0.9 % IV SOLN
600.0000 mg | INTRAVENOUS | Status: DC
  Filled 2015-08-29: qty 600

## 2015-08-29 MED ORDER — MILRINONE LACTATE IN DEXTROSE 20-5 MG/100ML-% IV SOLN: 0.3000 ug/kg/min | INTRAVENOUS | Status: DC

## 2015-08-29 MED ORDER — POTASSIUM CHLORIDE 2 MEQ/ML IV SOLN
80.0000 meq | INTRAVENOUS | Status: DC
  Filled 2015-08-29: qty 40

## 2015-08-29 MED ORDER — SODIUM CHLORIDE 0.9 % IV SOLN
INTRAVENOUS | Status: AC
  Administered 2015-08-30: 1.7 [IU]/h via INTRAVENOUS
  Filled 2015-08-29: qty 2.5

## 2015-08-29 MED ORDER — SODIUM CHLORIDE 0.9 % IV SOLN
INTRAVENOUS | Status: DC
  Filled 2015-08-29: qty 40

## 2015-08-29 MED ORDER — FLUCONAZOLE IN SODIUM CHLORIDE 400-0.9 MG/200ML-% IV SOLN
400.0000 mg | INTRAVENOUS | Status: DC
  Filled 2015-08-29: qty 200

## 2015-08-29 MED ORDER — PHENYLEPHRINE HCL 10 MG/ML IJ SOLN
0.0000 ug/min | INTRAVENOUS | Status: DC
  Filled 2015-08-29: qty 2

## 2015-08-29 MED ORDER — NOREPINEPHRINE BITARTRATE 1 MG/ML IV SOLN
0.0000 ug/min | INTRAVENOUS | Status: DC
  Filled 2015-08-29: qty 4

## 2015-08-29 MED ORDER — FLUCONAZOLE IN SODIUM CHLORIDE 400-0.9 MG/200ML-% IV SOLN
400.0000 mg | INTRAVENOUS | Status: AC
  Administered 2015-08-30: 400 mg via INTRAVENOUS
  Filled 2015-08-29 (×2): qty 200

## 2015-08-29 MED ORDER — DEXMEDETOMIDINE HCL IN NACL 400 MCG/100ML IV SOLN
0.1000 ug/kg/h | INTRAVENOUS | Status: DC
  Filled 2015-08-29: qty 100

## 2015-08-29 MED ORDER — NITROGLYCERIN IN D5W 200-5 MCG/ML-% IV SOLN
0.0000 ug/min | INTRAVENOUS | Status: DC
  Filled 2015-08-29: qty 250

## 2015-08-29 MED ORDER — VANCOMYCIN HCL 1000 MG IV SOLR
1000.0000 mg | INTRAVENOUS | Status: DC
  Filled 2015-08-29: qty 1000

## 2015-08-29 MED ORDER — VANCOMYCIN HCL 1000 MG IV SOLR
1000.0000 mg | INTRAVENOUS | Status: AC
  Administered 2015-08-30: 1000 mg
  Filled 2015-08-29 (×3): qty 1000

## 2015-08-29 MED ORDER — MUPIROCIN 2 % EX OINT
1.0000 "application " | TOPICAL_OINTMENT | Freq: Two times a day (BID) | CUTANEOUS | Status: AC
  Administered 2015-08-29 (×2): 1 via NASAL
  Filled 2015-08-29 (×2): qty 22

## 2015-08-29 MED ORDER — DEXTROSE 5 % IV SOLN
1.5000 g | INTRAVENOUS | Status: DC
  Filled 2015-08-29: qty 1.5

## 2015-08-29 MED ORDER — SODIUM CHLORIDE 0.9 % IV SOLN
600.0000 mg | INTRAVENOUS | Status: DC
  Administered 2015-08-30: 600 mg via INTRAVENOUS
  Filled 2015-08-29: qty 600

## 2015-08-29 MED ORDER — SODIUM CHLORIDE 0.9 % IV SOLN
INTRAVENOUS | Status: DC
  Filled 2015-08-29: qty 30

## 2015-08-29 MED ORDER — EPINEPHRINE HCL 1 MG/ML IJ SOLN
0.0000 ug/min | INTRAMUSCULAR | Status: AC
  Administered 2015-08-30: 2 ug/min via INTRAVENOUS
  Filled 2015-08-29: qty 4

## 2015-08-29 MED ORDER — DOPAMINE-DEXTROSE 3.2-5 MG/ML-% IV SOLN
0.0000 ug/kg/min | INTRAVENOUS | Status: DC
  Filled 2015-08-29: qty 250

## 2015-08-29 MED ORDER — MAGNESIUM SULFATE 50 % IJ SOLN
40.0000 meq | INTRAMUSCULAR | Status: DC
  Filled 2015-08-29: qty 10

## 2015-08-29 MED ORDER — INSULIN GLARGINE 100 UNIT/ML ~~LOC~~ SOLN
12.0000 [IU] | Freq: Two times a day (BID) | SUBCUTANEOUS | Status: DC
  Administered 2015-08-29: 12 [IU] via SUBCUTANEOUS
  Filled 2015-08-29 (×2): qty 0.12

## 2015-08-29 MED ORDER — CHLORHEXIDINE GLUCONATE 0.12 % MT SOLN
15.0000 mL | Freq: Once | OROMUCOSAL | Status: AC
  Administered 2015-08-30: 15 mL via OROMUCOSAL

## 2015-08-29 MED ORDER — INSULIN GLARGINE 100 UNIT/ML ~~LOC~~ SOLN
15.0000 [IU] | Freq: Two times a day (BID) | SUBCUTANEOUS | Status: DC
  Administered 2015-08-29: 15 [IU] via SUBCUTANEOUS
  Filled 2015-08-29 (×3): qty 0.15

## 2015-08-29 MED ORDER — TEMAZEPAM 15 MG PO CAPS: 15.0000 mg | ORAL_CAPSULE | Freq: Once | ORAL | Status: DC | PRN

## 2015-08-29 MED ORDER — SODIUM CHLORIDE 0.9 % IV SOLN
INTRAVENOUS | Status: AC
  Administered 2015-08-30: 69.8 mL/h via INTRAVENOUS
  Filled 2015-08-29: qty 40

## 2015-08-29 MED ORDER — VANCOMYCIN HCL 10 G IV SOLR
1250.0000 mg | INTRAVENOUS | Status: AC
  Administered 2015-08-30: 60 mg via INTRAVENOUS
  Filled 2015-08-29: qty 1250

## 2015-08-29 MED ORDER — INSULIN ASPART 100 UNIT/ML ~~LOC~~ SOLN
20.0000 [IU] | Freq: Once | SUBCUTANEOUS | Status: AC
  Administered 2015-08-29: 20 [IU] via SUBCUTANEOUS

## 2015-08-29 MED ORDER — CHLORHEXIDINE GLUCONATE CLOTH 2 % EX PADS
6.0000 | MEDICATED_PAD | Freq: Once | CUTANEOUS | Status: AC
  Administered 2015-08-30: 6 via TOPICAL

## 2015-08-29 MED ORDER — RIFAMPIN 600 MG IV SOLR
600.0000 mg | INTRAVENOUS | Status: DC
  Filled 2015-08-29 (×2): qty 600

## 2015-08-29 MED ORDER — NITROGLYCERIN IN D5W 200-5 MCG/ML-% IV SOLN: 0.0000 ug/min | INTRAVENOUS | Status: DC

## 2015-08-29 MED ORDER — MILRINONE LACTATE IN DEXTROSE 20-5 MG/100ML-% IV SOLN
0.3000 ug/kg/min | INTRAVENOUS | Status: AC
  Administered 2015-08-30: 08:00:00 via INTRAVENOUS
  Administered 2015-08-30: 0.125 ug/kg/min via INTRAVENOUS
  Filled 2015-08-29: qty 100

## 2015-08-29 NOTE — Progress Notes (Signed)
LVAD Coordinator Note: Pre-Implant  Met with patient, wife and youngest daughter. Expectations after surgery has been discussed and all questions have been answered. Discussed that we will provide updates every few hours via phone or in person. Discussed he will be sedated on breathing machine at least until the AM after surgery on Saturday and advised his family to go home and rest since he will have 1:1 nursing care for the immediate post-op period.   Patient completed 525 feet during 6 minute walk test. Tolerated well without rests/breaks with rolling walker.   Neurocognitive trail making deferred.  Pre-VAD QOL surveys completed by patient with assistance from his family.  Janene Madeira, RN VAD Coordinator   Office: [phone] 24/7 VAD Pager: [phone]

## 2015-08-29 NOTE — Progress Notes (Signed)
   08/29/15 0900  Clinical Encounter Type  Visited With Patient and family together  Visit Type Initial  Referral From Chaplain  Consult/Referral To Chaplain  Stress Factors  Patient Stress Factors Major life changes  Family Stress Factors Loss of control;Major life changes  CHP stopped in patient room per suggestion of nurse. Patient's spouse said CHP Casimiro Needle has visited often. CHP offered emotional support. Rodney Booze 08/29/2015

## 2015-08-29 NOTE — Progress Notes (Signed)
CSW met with patient, wife and both daughters at bedside. Daughter brought patient some food to encourage eating. Patient seemed to enjoy and finished with daughter assisting. CSW discussed planned VAD implant tomorrow. Patient states he is ready although mentioned concern of family leaving his side. Patient's wife provided encouragement and support to patient and assured patient she will be present for him. CSW provided supportive intervention and personally showed daughter waiting room for 2S unit where patient will go post op. Family appreciative of support and verbalize understanding of surgery tomorrow and deny any further questions at this time. CSW will continue to follow for support and encouragement throughout recovery. Raquel Sarna, LCSW [phone]

## 2015-08-29 NOTE — Progress Notes (Signed)
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 Normal cors. EF 10% with low cardiac output.   Started on norepi due to persistent hypotension. Remains on norepi 18 mcg and Milrinone 0.125. Amio started for frequent PVCs. COr-track placed for night time tube feeds.   Complaining of fatigue. Denies SOB   Co-ox 63%  Objective:   Weight Range:  Vital Signs:   Temp:  [98 F (36.7 C)-98.2 F (36.8 C)] 98 F (36.7 C) (06/22 0400) Pulse Rate:  [60-102] 64 (06/22 0600) Resp:  [14-31] 20 (06/22 0600) BP: (81-115)/(57-80) 114/76 mmHg (06/22 0600) SpO2:  [92 %-99 %] 94 % (06/22 0600) Weight:  [168 lb 3.4 oz (76.3 kg)] 168 lb 3.4 oz (76.3 kg) (06/22 0600) Last BM Date: 08/29/15  Weight change: Filed Weights   08/27/15 0500 08/28/15 0500 08/29/15 0600  Weight: 166 lb 1.6 oz (75.342 kg) 167 lb (75.751 kg) 168 lb 3.4 oz (76.3 kg)    Intake/Output:   Intake/Output Summary (Last 24 hours) at 08/29/15 0718 Last data filed at 08/29/15 0700  Gross per 24 hour  Intake 2974.7 ml  Output   2075 ml  Net  899.7 ml     Physical Exam: CVP 8 General: Lying in bed NAD. Wife at bedside.  HEENT: normal  Neck: supple. No JVD.  Carotids 2+ bilat; no bruits. No lymphadenopathy or thryomegaly appreciated.  Cor: PMI laterally displaced. Regular rate & rhythm. No murmur. +s3.  Lungs: clear Abdomen: soft, nontender, mildly distended. No hepatosplenomegaly. No bruits or masses. Good bowel sounds.  Extremities: no cyanosis, clubbing, rash, no edema . PICC RUE Neuro: alert & oriented x 3, cranial nerves grossly intact. moves all 4 extremities w/o difficulty. Affect pleasant.  Telemetry:  Sinus with frequent PVCs 70s  Labs: Basic Metabolic Panel:  Recent Labs Lab 08/22/15 1527  08/23/15 0453  08/25/15 0515 08/26/15 0415 08/27/15 0518 08/28/15 0508 08/29/15 0411  NA  --   < >  --   < >  134* 134* 136 134* 130*  K  --   < >  --   < > 4.1 3.7 4.1 4.1 4.1  CL  --   < >  --   < > 102 103 100* 98* 94*  CO2  --   --   --   < > 24 26 29 29 30   GLUCOSE  --   < >  --   < > 165* 141* 154* 228* 316*  BUN  --   < >  --   < > 17 15 15 19 20   CREATININE  --   < >  --   < > 1.12 0.97 1.07 1.13 0.95  CALCIUM  --   --   --   < > 8.7* 8.3* 9.1 9.1 8.8*  MG 1.9  --  2.0  --   --   --   --   --   --   < > = values in this interval not displayed.  Liver Function Tests:  Recent Labs Lab 08/27/15 0518 08/28/15 0508 08/29/15 0411  AST 40 33 30  ALT 42 38 36  ALKPHOS 48 46 55  BILITOT 1.2 1.1 1.0  PROT 5.8* 5.5* 5.5*  ALBUMIN 3.1* 2.9* 2.9*   No results for input(s): LIPASE, AMYLASE in the last 168 hours. No results for input(s): AMMONIA in the last  168 hours.  CBC:  Recent Labs Lab 08/23/15 0454 08/24/15 0538 08/25/15 0515 08/26/15 0415 08/29/15 0411  WBC 5.4 9.7 9.5 7.4 8.0  NEUTROABS 3.9 6.7 6.5 4.5  --   HGB 12.2* 13.1 12.8* 12.1* 11.9*  HCT 39.7 43.1 41.1 39.2 38.9*  MCV 84.3 84.0 83.5 85.2 84.2  PLT 107* 142* 120* 111* 124*    Cardiac Enzymes: No results for input(s): CKTOTAL, CKMB, CKMBINDEX, TROPONINI in the last 168 hours.  BNP: BNP (last 3 results)  Recent Labs  01/25/15 1128 05/13/15 2248 08/21/15 1345  BNP 886.7* 1235.6* 2132.9*    ProBNP (last 3 results) No results for input(s): PROBNP in the last 8760 hours.    Other results:  Imaging: Dg Abd 1 View  08/27/2015  CLINICAL DATA:  Feeding tube placement.  Initial encounter. EXAM: ABDOMEN - 1 VIEW COMPARISON:  CT of the chest, abdomen and pelvis performed 08/24/2015 FINDINGS: The patient's enteric tube is noted coiling within the stomach, ending at the antrum of the stomach. This will likely progress further with time. The visualized bowel gas pattern is unremarkable. Scattered air and stool filled loops of colon are seen; no abnormal dilatation of small bowel loops is seen to suggest small bowel  obstruction. No free intra-abdominal air is identified, though evaluation for free air is limited on a single supine view. The visualized osseous structures are within normal limits; the sacroiliac joints are unremarkable in appearance. The visualized lung bases are essentially clear. Pacemaker/AICD leads are partially imaged. IMPRESSION: 1. Enteric tube noted coiling within the stomach, ending at the antrum of the stomach. This will likely progress further with time. 2. Unremarkable bowel gas pattern; no free intra-abdominal air seen. Moderate amount of stool noted in the colon. Electronically Signed   By: Roanna Raider M.D.   On: 08/27/2015 21:47   Dg Abd Portable 1v  08/28/2015  CLINICAL DATA:  Feeding tube placement. EXAM: PORTABLE ABDOMEN - 1 VIEW COMPARISON:  08/27/2015 FINDINGS: A feeding tube is seen which is looped in the stomach with the the distal tip overlying the gastric antrum or pylorus. No evidence of dilated bowel loops. Moderate colonic stool noted. IMPRESSION: Feeding tube is looped in the stomach, with distal tip overlying gastric antrum or pylorus. Electronically Signed   By: Myles Rosenthal M.D.   On: 08/28/2015 08:24     Medications:     Scheduled Medications: . calcium-vitamin D   Oral Daily  . enoxaparin (LOVENOX) injection  40 mg Subcutaneous Q24H  . escitalopram  10 mg Oral Daily  . feeding supplement (ENSURE ENLIVE)  237 mL Oral TID BM  . feeding supplement (PRO-STAT SUGAR FREE 64)  30 mL Per Tube Daily  . furosemide  80 mg Intravenous Daily  . insulin aspart  0-5 Units Subcutaneous QHS  . insulin aspart  0-9 Units Subcutaneous TID WC  . insulin glargine  12 Units Subcutaneous BID  . ketorolac  1 drop Left Eye Daily  . multivitamin with minerals  1 tablet Oral Daily  . pantoprazole  40 mg Oral Daily  . potassium chloride SA  40 mEq Oral Daily  . prednisoLONE acetate  1 drop Left Eye Daily  . psyllium  0.52 g Oral Daily  . sodium chloride flush  10-40 mL  Intracatheter Q12H  . vitamin B-12  100 mcg Oral Daily  . vitamin C  500 mg Oral QODAY    Infusions: . amiodarone 30 mg/hr (08/28/15 2000)  . feeding supplement (JEVITY 1.5 CAL/FIBER)  1,000 mL (08/28/15 0800)  . milrinone 0.125 mcg/kg/min (08/28/15 2000)  . norepinephrine (LEVOPHED) Adult infusion 18 mcg/min (08/29/15 0600)    PRN Medications: acetaminophen, ondansetron (ZOFRAN) IV, phenol, polyvinyl alcohol, sodium chloride flush, zolpidem   Assessment:   1. 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 EF 15%. Cath 8/14 with normal cors 2. Frequent PVCs 3. Severe Malnutrition- Prealbumin 14.7 on 6/20   Plan/Discussion:   Anticipate VAD Friday.    CO-OX 63%. CVP 7-8. Continue norepi 18 mcg + milrinone 0.125 mcg. Renal function ok.   Prealbumin 15. Cortrack placed for tube feeds.     Consult PT. OOB today.   Length of Stay: 8 Amy Clegg NP-C  08/29/2015, 7:18 AM  Advanced Heart Failure Team Pager [phone] (M-F; 7a - 4p)  Please contact [facility] for night-coverage after hours (4p -7a ) and weekends on [email or web address]  Patient seen with NP, agree with the above note.  Hemodynamically stable on current support, will continue.    Tube feeds initiated and ongoing.  Poor appetite, not really eating much at all.  Blood glucose higher with tube feeds, insulin glargine started today.   For LVAD tomorrow.   Marca Ancona 08/29/2015 11:46 AM

## 2015-08-29 NOTE — Progress Notes (Signed)
CARDIAC REHAB PHASE I   PRE:  Rate/Rhythm: 62 paced   BP:  Sitting: 102/68        SaO2: 96 RA  MODE:  Ambulation: 700 ft total, 525 in 6 minutes for 6 minute walk test   POST:  Rate/Rhythm:87 paced  BP:  Sitting: 105/73         SaO2: 97 RA  Pt in bed, wife states pt spit out food earlier, still not eating. Pt required assistance to stand from sitting position, heavily relied on use of arms. Pt ambulated 525 ft in 6 minutes, no rest breaks, rolling walker, IV, tube feeds, gait belt, assist x1, tolerated fairly well, denies any complaints. Pt somewhat distracted, unable to walk in a straight path, swaying some from side to side, had difficulty staying inside walker with turns. Pt ambulated and additional 175 ft for a total distance of 700 ft. Upon return to room pt attempted to sit in recliner without use of arms, pt unable to do so due to severe knee pain, ultimately relied heavily on arms to sit. Reviewed sternal precautions, IS. Pt to recliner after walk, feet elevated, call bell within reach. Will follow post op.    [phone] Joylene Grapes, RN, BSN 08/29/2015 11:22 AM

## 2015-08-29 NOTE — Progress Notes (Signed)
Patient blood sugar 445 this AM, lantus given, called NP Amy with HF team- orders to give 20 units of insulin novolog and recheck blood sugar at 1200 and follow sliding scale. Will continue to monitor closely.

## 2015-08-29 NOTE — Progress Notes (Signed)
[name] has been discussed with the VAD Medical Review board on Monday 08/26/15. The team feels as if the patient is a good candidate for Destination LVAD therapy. The patient meets criteria for a LVAD implant as listed below:  1)  Has failed to respond to optimal medical management as he has been intolerant of meds including low doses of beta blockers and ACEIs (dizziness/orthostasis) for at least 45 of the last 60 days and is now inotrope and vasopressor dependent.        *On Inotropes Milrinone 0.375 started 08/23/15.      *Levophed started 08/23/15 and titrated upward for effect.   2)  He has a left ventricular ejection fraction (LVEF) < 25% and have demonstrated functional limitation with a peak oxygen consumption of <14 ml/kg/min unless balloon pump or inotrope dependent or physically unable to perform the test.         *LVEF 20 - 25% by echo 06/10/2015            *CPX Testing: unable to complete recent testing d/t milrinone and hospitalization  3)  Social work and palliative care evaluations demonstrate appropriate support system in place for discharge to home with a VAD and that end of life discussions have taken place. Both services have expressed no concern regarding patient's candidacy.         *Social work consult: 08/26/15 Raquel Sarna, LCSW        *Palliative Care Consult: 08/23/15 Imogene Burn, PA-C   4)  Primary caretaker identified that can be taught along with the patient how to manage        the VAD equipment.        *Name: [name] (wife) & daughters [name] & [name]  5)  Deemed appropriate by our financial coordinator: Cecilio Asper        Prior approval: 08/24/2015 NPCR Required  6)  VAD Coordinators Zada Girt and Tanda Rockers have met with patient and caregiver, shown them the VAD equipment and discussed with the patient and caregiver about lifestyle changes necessary for success on mechanical circulatory device.        *Met with Zada Girt on  05/24/2015 and throughout current hospital admission.        *Consent for VAD Evaluation/Caregiver Agreement/HIPPA Release/Photo Release signed on 08/22/15.  7)  Patient has been discussed on 08/29/15 with [facility] (Dr.Ted Pilar Plate & Dr. Loralie Champagne) and felt to be an appropriate candidate for destination therapy as his age is at the upper limit of acceptable candidate for heart transplant and will likely not survive to be listed and wait.    8)  Intermacs profile: 2 (Milrinone infusion + escalating doses of norepinephrine)  INTERMACS 1: Critical cardiogenic shock describes a patient who is "crashing and burning", in which a patient has life-threatening hypotension and rapidly escalating inotropic pressor support, with critical organ hypoperfusion often confirmed by worsening acidosis and lactate levels.  INTERMACS 2: Progressive decline describes a patient who has been demonstrated "dependent" on inotropic support but nonetheless shows signs of continuing deterioration in nutrition, renal function, fluid retention, or other major status indicator. Patient profile 2 can also describe a patient with refractory volume overload, perhaps with evidence of impaired perfusion, in whom inotropic infusions cannot be maintained due to tachyarrhythmias, clinical ischemia, or other intolerance.  INTERMACS 3: Stable but inotrope dependent describes a patient who is clinically stable on mild-moderate doses of intravenous inotropes (or has a temporary circulatory support device) after repeated documentation of  failure to wean without symptomatic hypotension, worsening symptoms, or progressive organ dysfunction (usually renal). It is critical to monitor nutrition, renal function, fluid balance, and overall status carefully in order to distinguish between a  patient who is truly stable at Patient Profile 3 and a patient who has unappreciated decline rendering them Patient Profile 2. This patient may be either at home or in  the hospital.   INTERMACS 4: Resting symptoms describes a patient who is at home on oral therapy but frequently has symptoms of congestion at rest or with activities of daily living (ADL). He or she may have orthopnea, shortness of breath during ADL such as dressing or bathing, gastrointestinal symptoms (abdominal discomfort, nausea, poor appetite), disabling ascites or severe lower extremity edema. This patient should be carefully considered for more intensive management and surveillance programs, which may in some cases, reveal poor compliance that would compromise outcomes with any therapy.  .  INTERMACS 5: Exertion Intolerant describes a patient who is comfortable at rest but unable to engage in any activity, living predominantly within the house or housebound. This patient has no congestive symptoms, but may have chronically elevated volume status, frequently with renal dysfunction, and may be characterized as exercise intolerant.   INTERMACS 6: Exertion Limited also describes a patient who is comfortable at rest without evidence of fluid overload, but who is able to do some mild activity. Activities of daily living are comfortable and minor activities outside the home such as visiting friends or going to a restaurant can be performed, but fatigue results within a few minutes of any meaningful physical exertion. This patient has occasional episodes of worsening symptoms and is likely to have had a hospitalization for heart failure within the past year.   INTERMACS 7: Advanced NYHA Class 3 describes a patient who is clinically stable with a reasonable level of comfortable activity, despite history of previous decompensation that is not recent. This patient is usually able to walk more than a block. Any decompensation requiring intravenous diuretics or hospitalization within the previous month should make this person a Patient Profile 6 or lower.    9)  NYHA Class: IIIB - IV  The patient  is scheduled for surgery on 08/30/15 with Dr. Prescott Gum and Dr. Cyndia Bent for Destination Therapy.

## 2015-08-29 NOTE — Progress Notes (Signed)
Inpatient Diabetes Program Recommendations  AACE/ADA: New Consensus Statement on Inpatient Glycemic Control (2015)  Target Ranges:  Prepandial:   less than 140 mg/dL      Peak postprandial:   less than 180 mg/dL (1-2 hours)      Critically ill patients:  140 - 180 mg/dL   Results for JAKI, DUDAK (MRN [id number]) as of 08/29/2015 10:45  Ref. Range 08/28/2015 12:37 08/28/2015 17:20 08/28/2015 22:29  Glucose-Capillary Latest Ref Range: 65-99 mg/dL 846 (H) 962 (H) 952 (H)    Review of Glycemic Control  Diabetes history: No hx DM Current orders for Inpatient glycemic control: Lantus 15 units q d + Novolog correction 0-20 units tid with meals + 0-5 units hs  Inpatient Diabetes Program Recommendations:  Noted patient eating very little and on tube feedings. Please consider adding meal coverage Novolog 3-4 units q 4 hrs. for tube feedings (hold if tube feed stopped for any reason) and change Novolog correction to 0-15 units q 4 hrs.  Thank you, [name]. Tanganika Barradas, RN, MSN, CDE Inpatient Glycemic Control Team Team Pager [phone] (8am-5pm) 08/29/2015 10:49 AM

## 2015-08-29 NOTE — Anesthesia Preprocedure Evaluation (Addendum)
Anesthesia Evaluation  Patient identified by MRN, date of birth, ID band Patient awake    Reviewed: Allergy & Precautions, NPO status , Patient's Chart, lab work & pertinent test results  Airway Mallampati: II  TM Distance: >3 FB Neck ROM: Limited    Dental  (+) Dental Advisory Given   Pulmonary neg pulmonary ROS,    breath sounds clear to auscultation       Cardiovascular +CHF  + dysrhythmias + Cardiac Defibrillator  Rhythm:Regular Rate:Normal  Cath: mild non-obstructive CAD. EF 10%  06/2015: Study Conclusions  - Left ventricle: The cavity size was moderately dilated. Wall thickness was normal. Systolic function was severely reduced. The estimated ejection fraction was in the range of 20% to 25%.Severe diffuse hypokinesis with no identifiable regional variations. Doppler parameters are consistent with restrictive physiology, indicative of decreased left ventricular diastolic compliance and/or increased left atrial pressure. No evidence of  thrombus. - Ventricular septum: Septal motion showed paradox. - Mitral valve: There was mild regurgitation. - Left atrium: The atrium was severely dilated. - Right ventricle: The cavity size was moderately dilated. Systolic  function was moderately reduced. - Right atrium: The atrium was moderately dilated. - Tricuspid valve: There was mild-moderate regurgitation directed  centrally. Diastolic regurgitation was present. - Pulmonary arteries: Systolic pressure was moderately increased.  PA peak pressure: 54 mm Hg (S).   Neuro/Psych negative neurological ROS     GI/Hepatic Neg liver ROS, hiatal hernia, GERD  ,  Endo/Other  negative endocrine ROS  Renal/GU negative Renal ROS     Musculoskeletal  (+) Arthritis ,   Abdominal   Peds  Hematology negative hematology ROS (+) Blood dyscrasia (Thrombocytopenia), anemia ,   Anesthesia Other Findings   Reproductive/Obstetrics                             Lab Results  Component Value Date   WBC 8.0 08/29/2015   HGB 11.9* 08/29/2015   HCT 38.9* 08/29/2015   MCV 84.2 08/29/2015   PLT 124* 08/29/2015   Lab Results  Component Value Date   CREATININE 0.95 08/29/2015   BUN 20 08/29/2015   NA 130* 08/29/2015   K 4.1 08/29/2015   CL 94* 08/29/2015   CO2 30 08/29/2015   Lab Results  Component Value Date   INR 1.30 08/28/2015   INR 1.30 08/24/2015   INR 1.36 08/22/2015    Anesthesia Physical Anesthesia Plan  ASA: IV  Anesthesia Plan: General   Post-op Pain Management:    Induction: Intravenous  Airway Management Planned: Oral ETT  Additional Equipment: Arterial line, PA Cath, CVP, TEE and Ultrasound Guidance Line Placement  Intra-op Plan:   Post-operative Plan: Post-operative intubation/ventilation  Informed Consent: I have reviewed the patients History and Physical, chart, labs and discussed the procedure including the risks, benefits and alternatives for the proposed anesthesia with the patient or authorized representative who has indicated his/her understanding and acceptance.   Dental advisory given  Plan Discussed with: CRNA  Anesthesia Plan Comments:        Anesthesia Quick Evaluation

## 2015-08-29 NOTE — Progress Notes (Signed)
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 The patient and family understand the risks of stroke, bleeding, blood transfusion requirement, biventricular failure requiring RVAD, postoperative pulmonary problems including pleural effusion, postoperative infection, postoperative organ failure, and death.  Objective: Vital signs in last 24 hours: Temp:  [98 F (36.7 C)-98.2 F (36.8 C)] 98 F (36.7 C) (06/22 0800) Pulse Rate:  [60-102] 69 (06/22 0900) Cardiac Rhythm:  [-] A-V Sequential paced (06/22 0800) Resp:  [16-31] 25 (06/22 0900) BP: (81-116)/(57-80) 116/69 mmHg (06/22 0900) SpO2:  [92 %-99 %] 93 % (06/22 0900) Weight:  [168 lb 3.4 oz (76.3 kg)] 168 lb 3.4 oz (76.3 kg) (06/22 0600)  Hemodynamic parameters for last 24 hours: CVP:  [7 mmHg-10 mmHg] 9 mmHg  Intake/Output from previous day: 06/21 0701 - 06/22 0700 In: 3024.7 [P.O.:1020; I.V.:829.7; NG/GT:1175] Out: 2075 [Urine:2075] Intake/Output this shift: Total I/O In: 232.6 [P.O.:60; I.V.:72.6; NG/GT:100] Out: -   Chronically ill Feeding tube in place Distant breath sounds Minimal peripheral edema Paced heart rhythm Abdomen soft  Lab Results:  Recent Labs  08/29/15 0411  WBC 8.0  HGB 11.9*  HCT 38.9*  PLT 124*   BMET:  Recent Labs  08/28/15 0508 08/29/15 0411  NA 134* 130*  K 4.1 4.1  CL 98* 94*  CO2 29 30  GLUCOSE 228* 316*  BUN 19 20  CREATININE 1.13 0.95  CALCIUM 9.1 8.8*    PT/INR:  Recent  Labs  08/28/15 0825  LABPROT 16.3*  INR 1.30   ABG    Component Value Date/Time   PHART 7.513* 08/22/2015 1820   HCO3 24.2* 08/22/2015 1820   TCO2 27 08/22/2015 2143   ACIDBASEDEF 1.0 08/22/2015 0945   O2SAT 63.3 08/29/2015 0420   CBG (last 3)   Recent Labs  08/28/15 1237 08/28/15 1720 08/28/15 2229  GLUCAP 317* 212* 211*    Assessment/Plan: S/P Evaluation for mechanical support for advanced heart failure Plan LVAD implantation in a.m.   LOS: 8 days    Kathlee Nations Trigt III 08/29/2015

## 2015-08-30 ENCOUNTER — Inpatient Hospital Stay (HOSPITAL_COMMUNITY): Payer: Medicare Other

## 2015-08-30 ENCOUNTER — Inpatient Hospital Stay (HOSPITAL_COMMUNITY): Payer: Medicare Other | Admitting: Certified Registered"

## 2015-08-30 ENCOUNTER — Other Ambulatory Visit: Payer: Self-pay

## 2015-08-30 ENCOUNTER — Encounter (HOSPITAL_COMMUNITY): Payer: Self-pay | Admitting: Critical Care Medicine

## 2015-08-30 ENCOUNTER — Encounter (HOSPITAL_COMMUNITY)
Admission: AD | Disposition: A | Discharge status: Other Acute Inpatient Hospital | Payer: Self-pay | Source: Ambulatory Visit | Attending: Cardiothoracic Surgery

## 2015-08-30 DIAGNOSIS — I2589 Other forms of chronic ischemic heart disease: Secondary | ICD-10-CM

## 2015-08-30 DIAGNOSIS — I5023 Acute on chronic systolic (congestive) heart failure: Secondary | ICD-10-CM

## 2015-08-30 HISTORY — PX: INTRAOPERATIVE TRANSESOPHAGEAL ECHOCARDIOGRAM: SHX5062

## 2015-08-30 HISTORY — PX: INSERTION OF IMPLANTABLE LEFT VENTRICULAR ASSIST DEVICE: SHX5866

## 2015-08-30 LAB — POCT I-STAT 3, ART BLOOD GAS (G3+)
ACID-BASE EXCESS: 2 mmol/L (ref 0.0–2.0)
Acid-Base Excess: 8 mmol/L — ABNORMAL HIGH (ref 0.0–2.0)
Acid-Base Excess: 6 mmol/L — ABNORMAL HIGH (ref 0.0–2.0)
Acid-Base Excess: 4 mmol/L — ABNORMAL HIGH (ref 0.0–2.0)
BICARBONATE: 31.3 meq/L — AB (ref 20.0–24.0)
BICARBONATE: 28.2 meq/L — AB (ref 20.0–24.0)
Bicarbonate: 32.2 mEq/L — ABNORMAL HIGH (ref 20.0–24.0)
Bicarbonate: 27.9 mEq/L — ABNORMAL HIGH (ref 20.0–24.0)
O2 SAT: 99 %
O2 SAT: 100 %
O2 Saturation: 100 %
O2 Saturation: 100 %
PCO2 ART: 41.8 mmHg (ref 35.0–45.0)
PCO2 ART: 37 mmHg (ref 35.0–45.0)
PH ART: 7.495 — AB (ref 7.350–7.450)
PH ART: 7.428 (ref 7.350–7.450)
PH ART: 7.489 — AB (ref 7.350–7.450)
PO2 ART: 354 mmHg — AB (ref 80.0–100.0)
PO2 ART: 481 mmHg — AB (ref 80.0–100.0)
TCO2: 33 mmol/L (ref 0–100)
TCO2: 33 mmol/L (ref 0–100)
TCO2: 29 mmol/L (ref 0–100)
TCO2: 29 mmol/L (ref 0–100)
pCO2 arterial: 47.3 mmHg — ABNORMAL HIGH (ref 35.0–45.0)
pCO2 arterial: 46.4 mmHg — ABNORMAL HIGH (ref 35.0–45.0)
pH, Arterial: 7.384 (ref 7.350–7.450)
pO2, Arterial: 153 mmHg — ABNORMAL HIGH (ref 80.0–100.0)
pO2, Arterial: 233 mmHg — ABNORMAL HIGH (ref 80.0–100.0)

## 2015-08-30 LAB — DIC (DISSEMINATED INTRAVASCULAR COAGULATION)PANEL
D-Dimer, Quant: 1.87 ug/mL-FEU — ABNORMAL HIGH (ref 0.00–0.50)
Fibrinogen: 305 mg/dL (ref 204–475)
INR: 1.45 (ref 0.00–1.49)
Platelets: 110 10*3/uL — ABNORMAL LOW (ref 150–400)
Prothrombin Time: 17.7 seconds — ABNORMAL HIGH (ref 11.6–15.2)
Smear Review: NONE SEEN
aPTT: 35 seconds (ref 24–37)

## 2015-08-30 LAB — POCT I-STAT, CHEM 8
BUN: 24 mg/dL — ABNORMAL HIGH (ref 6–20)
BUN: 23 mg/dL — ABNORMAL HIGH (ref 6–20)
BUN: 23 mg/dL — AB (ref 6–20)
BUN: 22 mg/dL — ABNORMAL HIGH (ref 6–20)
BUN: 24 mg/dL — AB (ref 6–20)
BUN: 19 mg/dL (ref 6–20)
CALCIUM ION: 1.14 mmol/L (ref 1.13–1.30)
CALCIUM ION: 1.05 mmol/L — AB (ref 1.13–1.30)
CALCIUM ION: 1.07 mmol/L — AB (ref 1.13–1.30)
CHLORIDE: 96 mmol/L — AB (ref 101–111)
CHLORIDE: 92 mmol/L — AB (ref 101–111)
CHLORIDE: 94 mmol/L — AB (ref 101–111)
CHLORIDE: 97 mmol/L — AB (ref 101–111)
CREATININE: 0.9 mg/dL (ref 0.61–1.24)
CREATININE: 0.7 mg/dL (ref 0.61–1.24)
CREATININE: 0.7 mg/dL (ref 0.61–1.24)
Calcium, Ion: 1.17 mmol/L (ref 1.13–1.30)
Calcium, Ion: 1.01 mmol/L — ABNORMAL LOW (ref 1.13–1.30)
Calcium, Ion: 1.02 mmol/L — ABNORMAL LOW (ref 1.13–1.30)
Chloride: 96 mmol/L — ABNORMAL LOW (ref 101–111)
Chloride: 93 mmol/L — ABNORMAL LOW (ref 101–111)
Creatinine, Ser: 0.8 mg/dL (ref 0.61–1.24)
Creatinine, Ser: 0.7 mg/dL (ref 0.61–1.24)
Creatinine, Ser: 0.7 mg/dL (ref 0.61–1.24)
GLUCOSE: 182 mg/dL — AB (ref 65–99)
GLUCOSE: 160 mg/dL — AB (ref 65–99)
GLUCOSE: 160 mg/dL — AB (ref 65–99)
Glucose, Bld: 146 mg/dL — ABNORMAL HIGH (ref 65–99)
Glucose, Bld: 159 mg/dL — ABNORMAL HIGH (ref 65–99)
Glucose, Bld: 176 mg/dL — ABNORMAL HIGH (ref 65–99)
HCT: 36 % — ABNORMAL LOW (ref 39.0–52.0)
HCT: 28 % — ABNORMAL LOW (ref 39.0–52.0)
HEMATOCRIT: 35 % — AB (ref 39.0–52.0)
HEMATOCRIT: 30 % — AB (ref 39.0–52.0)
HEMATOCRIT: 27 % — AB (ref 39.0–52.0)
HEMATOCRIT: 29 % — AB (ref 39.0–52.0)
HEMOGLOBIN: 11.9 g/dL — AB (ref 13.0–17.0)
HEMOGLOBIN: 12.2 g/dL — AB (ref 13.0–17.0)
HEMOGLOBIN: 9.2 g/dL — AB (ref 13.0–17.0)
Hemoglobin: 10.2 g/dL — ABNORMAL LOW (ref 13.0–17.0)
Hemoglobin: 9.9 g/dL — ABNORMAL LOW (ref 13.0–17.0)
Hemoglobin: 9.5 g/dL — ABNORMAL LOW (ref 13.0–17.0)
POTASSIUM: 3.9 mmol/L (ref 3.5–5.1)
POTASSIUM: 4 mmol/L (ref 3.5–5.1)
POTASSIUM: 3.7 mmol/L (ref 3.5–5.1)
POTASSIUM: 4.3 mmol/L (ref 3.5–5.1)
POTASSIUM: 3.7 mmol/L (ref 3.5–5.1)
Potassium: 3.9 mmol/L (ref 3.5–5.1)
SODIUM: 135 mmol/L (ref 135–145)
SODIUM: 133 mmol/L — AB (ref 135–145)
SODIUM: 134 mmol/L — AB (ref 135–145)
Sodium: 136 mmol/L (ref 135–145)
Sodium: 136 mmol/L (ref 135–145)
Sodium: 134 mmol/L — ABNORMAL LOW (ref 135–145)
TCO2: 30 mmol/L (ref 0–100)
TCO2: 30 mmol/L (ref 0–100)
TCO2: 32 mmol/L (ref 0–100)
TCO2: 31 mmol/L (ref 0–100)
TCO2: 32 mmol/L (ref 0–100)
TCO2: 28 mmol/L (ref 0–100)

## 2015-08-30 LAB — HEMOGLOBIN AND HEMATOCRIT, BLOOD
HCT: 27 % — ABNORMAL LOW (ref 39.0–52.0)
Hemoglobin: 8.4 g/dL — ABNORMAL LOW (ref 13.0–17.0)

## 2015-08-30 LAB — CARBOXYHEMOGLOBIN
Carboxyhemoglobin: 1.4 % (ref 0.5–1.5)
Carboxyhemoglobin: 1.3 % (ref 0.5–1.5)
Carboxyhemoglobin: 2.4 % — ABNORMAL HIGH (ref 0.5–1.5)
Methemoglobin: 0.8 % (ref 0.0–1.5)
Methemoglobin: 1.1 % (ref 0.0–1.5)
Methemoglobin: 0.1 % (ref 0.0–1.5)
O2 SAT: 54.6 %
O2 Saturation: 90.6 %
O2 Saturation: 67.3 %
TOTAL HEMOGLOBIN: 12.2 g/dL — AB (ref 13.5–18.0)
Total hemoglobin: 10.2 g/dL — ABNORMAL LOW (ref 13.5–18.0)
Total hemoglobin: 8.1 g/dL — ABNORMAL LOW (ref 13.5–18.0)

## 2015-08-30 LAB — COMPREHENSIVE METABOLIC PANEL
ALT: 31 U/L (ref 17–63)
AST: 27 U/L (ref 15–41)
Albumin: 3 g/dL — ABNORMAL LOW (ref 3.5–5.0)
Alkaline Phosphatase: 53 U/L (ref 38–126)
Anion gap: 3 — ABNORMAL LOW (ref 5–15)
BUN: 24 mg/dL — ABNORMAL HIGH (ref 6–20)
CO2: 32 mmol/L (ref 22–32)
Calcium: 8.9 mg/dL (ref 8.9–10.3)
Chloride: 98 mmol/L — ABNORMAL LOW (ref 101–111)
Creatinine, Ser: 1.13 mg/dL (ref 0.61–1.24)
GFR calc Af Amer: >60 mL/min (ref >60)
GFR calc non Af Amer: >60 mL/min (ref >60)
Glucose, Bld: 152 mg/dL — ABNORMAL HIGH (ref 65–99)
Potassium: 4.1 mmol/L (ref 3.5–5.1)
Sodium: 133 mmol/L — ABNORMAL LOW (ref 135–145)
Total Bilirubin: 0.9 mg/dL (ref 0.3–1.2)
Total Protein: 5.9 g/dL — ABNORMAL LOW (ref 6.5–8.1)

## 2015-08-30 LAB — CBC
HCT: 37.9 % — ABNORMAL LOW (ref 39.0–52.0)
HCT: 26.8 % — ABNORMAL LOW (ref 39.0–52.0)
HCT: 27.7 % — ABNORMAL LOW (ref 39.0–52.0)
Hemoglobin: 11.7 g/dL — ABNORMAL LOW (ref 13.0–17.0)
Hemoglobin: 8.4 g/dL — ABNORMAL LOW (ref 13.0–17.0)
Hemoglobin: 8.6 g/dL — ABNORMAL LOW (ref 13.0–17.0)
MCH: 26 pg (ref 26.0–34.0)
MCH: 26 pg (ref 26.0–34.0)
MCH: 25.7 pg — ABNORMAL LOW (ref 26.0–34.0)
MCHC: 30.9 g/dL (ref 30.0–36.0)
MCHC: 31.3 g/dL (ref 30.0–36.0)
MCHC: 31 g/dL (ref 30.0–36.0)
MCV: 84.2 fL (ref 78.0–100.0)
MCV: 83 fL (ref 78.0–100.0)
MCV: 82.9 fL (ref 78.0–100.0)
Platelets: 127 10*3/uL — ABNORMAL LOW (ref 150–400)
Platelets: 115 10*3/uL — ABNORMAL LOW (ref 150–400)
Platelets: 122 10*3/uL — ABNORMAL LOW (ref 150–400)
RBC: 4.5 MIL/uL (ref 4.22–5.81)
RBC: 3.23 MIL/uL — ABNORMAL LOW (ref 4.22–5.81)
RBC: 3.34 MIL/uL — ABNORMAL LOW (ref 4.22–5.81)
RDW: 15.9 % — ABNORMAL HIGH (ref 11.5–15.5)
RDW: 15.6 % — ABNORMAL HIGH (ref 11.5–15.5)
RDW: 15.7 % — ABNORMAL HIGH (ref 11.5–15.5)
WBC: 6.9 10*3/uL (ref 4.0–10.5)
WBC: 7.5 10*3/uL (ref 4.0–10.5)
WBC: 8.2 10*3/uL (ref 4.0–10.5)

## 2015-08-30 LAB — BASIC METABOLIC PANEL
Anion gap: 9 (ref 5–15)
BUN: 20 mg/dL (ref 6–20)
CO2: 27 mmol/L (ref 22–32)
Calcium: 8.1 mg/dL — ABNORMAL LOW (ref 8.9–10.3)
Chloride: 100 mmol/L — ABNORMAL LOW (ref 101–111)
Creatinine, Ser: 0.93 mg/dL (ref 0.61–1.24)
GFR calc Af Amer: >60 mL/min (ref >60)
GFR calc non Af Amer: >60 mL/min (ref >60)
Glucose, Bld: 109 mg/dL — ABNORMAL HIGH (ref 65–99)
Potassium: 3.3 mmol/L — ABNORMAL LOW (ref 3.5–5.1)
Sodium: 136 mmol/L (ref 135–145)

## 2015-08-30 LAB — GLUCOSE, CAPILLARY
GLUCOSE-CAPILLARY: 112 mg/dL — AB (ref 65–99)
GLUCOSE-CAPILLARY: 117 mg/dL — AB (ref 65–99)
GLUCOSE-CAPILLARY: 110 mg/dL — AB (ref 65–99)
GLUCOSE-CAPILLARY: 150 mg/dL — AB (ref 65–99)
GLUCOSE-CAPILLARY: 115 mg/dL — AB (ref 65–99)
GLUCOSE-CAPILLARY: 108 mg/dL — AB (ref 65–99)
Glucose-Capillary: 62 mg/dL — ABNORMAL LOW (ref 65–99)
Glucose-Capillary: 117 mg/dL — ABNORMAL HIGH (ref 65–99)
Glucose-Capillary: 103 mg/dL — ABNORMAL HIGH (ref 65–99)
Glucose-Capillary: 162 mg/dL — ABNORMAL HIGH (ref 65–99)

## 2015-08-30 LAB — POCT I-STAT EG7
ACID-BASE EXCESS: 2 mmol/L (ref 0.0–2.0)
BICARBONATE: 28.2 meq/L — AB (ref 20.0–24.0)
CALCIUM ION: 0.94 mmol/L — AB (ref 1.13–1.30)
HEMATOCRIT: 28 % — AB (ref 39.0–52.0)
Hemoglobin: 9.5 g/dL — ABNORMAL LOW (ref 13.0–17.0)
O2 SAT: 83 %
PH VEN: 7.363 — AB (ref 7.250–7.300)
POTASSIUM: 3.5 mmol/L (ref 3.5–5.1)
Patient temperature: 37.3
SODIUM: 138 mmol/L (ref 135–145)
TCO2: 30 mmol/L (ref 0–100)
pCO2, Ven: 49.8 mmHg (ref 45.0–50.0)
pO2, Ven: 51 mmHg — ABNORMAL HIGH (ref 31.0–45.0)

## 2015-08-30 LAB — PROTIME-INR
INR: 1.43 (ref 0.00–1.49)
Prothrombin Time: 17.5 seconds — ABNORMAL HIGH (ref 11.6–15.2)

## 2015-08-30 LAB — CREATININE, SERUM
Creatinine, Ser: 0.98 mg/dL (ref 0.61–1.24)
GFR calc Af Amer: >60 mL/min (ref >60)
GFR calc non Af Amer: >60 mL/min (ref >60)

## 2015-08-30 LAB — APTT: aPTT: 35 seconds (ref 24–37)

## 2015-08-30 LAB — POCT I-STAT 4, (NA,K, GLUC, HGB,HCT)
Glucose, Bld: 92 mg/dL (ref 65–99)
HCT: 33 % — ABNORMAL LOW (ref 39.0–52.0)
HEMOGLOBIN: 11.2 g/dL — AB (ref 13.0–17.0)
POTASSIUM: 3.3 mmol/L — AB (ref 3.5–5.1)
SODIUM: 137 mmol/L (ref 135–145)

## 2015-08-30 LAB — MAGNESIUM
Magnesium: 1.8 mg/dL (ref 1.7–2.4)
Magnesium: 2.6 mg/dL — ABNORMAL HIGH (ref 1.7–2.4)

## 2015-08-30 LAB — PLATELET COUNT: Platelets: 88 10*3/uL — ABNORMAL LOW (ref 150–400)

## 2015-08-30 LAB — FIBRINOGEN: Fibrinogen: 274 mg/dL (ref 204–475)

## 2015-08-30 SURGERY — INSERTION OF IMPLANTABLE LEFT VENTRICULAR ASSIST DEVICE
Anesthesia: General | Site: Chest

## 2015-08-30 MED ORDER — HEPARIN SODIUM (PORCINE) 1000 UNIT/ML IJ SOLN
INTRAMUSCULAR | Status: AC
  Filled 2015-08-30: qty 1

## 2015-08-30 MED ORDER — SODIUM CHLORIDE 0.9 % IV SOLN
INTRAVENOUS | Status: DC
  Administered 2015-09-08: 10 mL/h via INTRAVENOUS

## 2015-08-30 MED ORDER — SODIUM CHLORIDE 0.9 % IV SOLN
1.0000 g/h | Freq: Once | INTRAVENOUS | Status: AC
  Administered 2015-08-30: 1 g/h via INTRAVENOUS
  Filled 2015-08-30: qty 20

## 2015-08-30 MED ORDER — DEXTROSE 50 % IV SOLN
15.0000 mL | Freq: Once | INTRAVENOUS | Status: AC
  Administered 2015-08-30: 15 mL via INTRAVENOUS

## 2015-08-30 MED ORDER — SODIUM CHLORIDE 0.9 % IV SOLN
INTRAVENOUS | Status: DC
  Administered 2015-08-30: 5.1 [IU]/h via INTRAVENOUS
  Filled 2015-08-30 (×2): qty 2.5

## 2015-08-30 MED ORDER — ROCURONIUM BROMIDE 50 MG/5ML IV SOLN
INTRAVENOUS | Status: AC
  Filled 2015-08-30: qty 1

## 2015-08-30 MED ORDER — EPINEPHRINE HCL 1 MG/ML IJ SOLN
0.0000 ug/min | INTRAVENOUS | Status: AC
  Filled 2015-08-30: qty 4

## 2015-08-30 MED ORDER — VANCOMYCIN HCL 1000 MG IV SOLR
INTRAVENOUS | Status: AC
  Filled 2015-08-30: qty 1000

## 2015-08-30 MED ORDER — SODIUM CHLORIDE 0.9 % IV SOLN
250.0000 mL | INTRAVENOUS | Status: DC
Start: 2015-08-31 — End: 2015-09-19
  Administered 2015-08-31: 250 mL via INTRAVENOUS

## 2015-08-30 MED ORDER — PHENYLEPHRINE 40 MCG/ML (10ML) SYRINGE FOR IV PUSH (FOR BLOOD PRESSURE SUPPORT)
PREFILLED_SYRINGE | INTRAVENOUS | Status: AC
  Filled 2015-08-30: qty 10

## 2015-08-30 MED ORDER — MILRINONE LACTATE IN DEXTROSE 20-5 MG/100ML-% IV SOLN: 0.3000 ug/kg/min | INTRAVENOUS | Status: DC

## 2015-08-30 MED ORDER — SODIUM CHLORIDE 0.45 % IV SOLN: INTRAVENOUS | Status: DC | PRN

## 2015-08-30 MED ORDER — POTASSIUM CHLORIDE 10 MEQ/50ML IV SOLN
10.0000 meq | INTRAVENOUS | Status: AC | PRN
  Administered 2015-08-30 – 2015-08-31 (×3): 10 meq via INTRAVENOUS
  Filled 2015-08-30: qty 50

## 2015-08-30 MED ORDER — LIDOCAINE 2% (20 MG/ML) 5 ML SYRINGE
INTRAMUSCULAR | Status: AC
  Filled 2015-08-30: qty 5

## 2015-08-30 MED ORDER — BISACODYL 10 MG RE SUPP: 10.0000 mg | Freq: Every day | RECTAL | Status: DC

## 2015-08-30 MED ORDER — ARTIFICIAL TEARS OP OINT
TOPICAL_OINTMENT | OPHTHALMIC | Status: DC | PRN
  Administered 2015-08-30: 1 via OPHTHALMIC

## 2015-08-30 MED ORDER — NITROGLYCERIN IN D5W 200-5 MCG/ML-% IV SOLN: 0.0000 ug/min | INTRAVENOUS | Status: DC

## 2015-08-30 MED ORDER — VANCOMYCIN HCL 1000 MG IV SOLR
INTRAVENOUS | Status: DC | PRN
  Administered 2015-08-30: 1000 mg

## 2015-08-30 MED ORDER — FENTANYL CITRATE (PF) 100 MCG/2ML IJ SOLN
INTRAMUSCULAR | Status: DC | PRN
  Administered 2015-08-30: 200 ug via INTRAVENOUS
  Administered 2015-08-30 (×2): 100 ug via INTRAVENOUS
  Administered 2015-08-30: 150 ug via INTRAVENOUS
  Administered 2015-08-30: 25 ug via INTRAVENOUS
  Administered 2015-08-30: 150 ug via INTRAVENOUS
  Administered 2015-08-30: 250 ug via INTRAVENOUS
  Administered 2015-08-30: 25 ug via INTRAVENOUS

## 2015-08-30 MED ORDER — ASPIRIN EC 325 MG PO TBEC
325.0000 mg | DELAYED_RELEASE_TABLET | Freq: Every day | ORAL | Status: DC
  Administered 2015-09-02 – 2015-09-03 (×2): 325 mg via ORAL
  Filled 2015-08-30 (×2): qty 1

## 2015-08-30 MED ORDER — ALBUMIN HUMAN 5 % IV SOLN
250.0000 mL | INTRAVENOUS | Status: AC | PRN
  Administered 2015-08-30 (×2): 250 mL via INTRAVENOUS

## 2015-08-30 MED ORDER — METOCLOPRAMIDE HCL 5 MG/ML IJ SOLN
10.0000 mg | Freq: Four times a day (QID) | INTRAMUSCULAR | Status: DC
Start: 2015-08-30 — End: 2015-09-04
  Administered 2015-08-30 – 2015-09-04 (×17): 10 mg via INTRAVENOUS
  Filled 2015-08-30 (×16): qty 2

## 2015-08-30 MED ORDER — NOREPINEPHRINE BITARTRATE 1 MG/ML IV SOLN
0.0000 ug/min | INTRAVENOUS | Status: AC
  Filled 2015-08-30: qty 4

## 2015-08-30 MED ORDER — DESMOPRESSIN ACETATE 4 MCG/ML IJ SOLN
20.0000 ug | Freq: Once | INTRAMUSCULAR | Status: AC
  Administered 2015-08-30: 20 ug via INTRAVENOUS
  Filled 2015-08-30: qty 5

## 2015-08-30 MED ORDER — PROTAMINE SULFATE 10 MG/ML IV SOLN
INTRAVENOUS | Status: AC
  Filled 2015-08-30: qty 25

## 2015-08-30 MED ORDER — MAGNESIUM SULFATE 4 GM/100ML IV SOLN
4.0000 g | Freq: Once | INTRAVENOUS | Status: AC
  Administered 2015-08-30: 4 g via INTRAVENOUS
  Filled 2015-08-30: qty 100

## 2015-08-30 MED ORDER — DEXTROSE 5 % IV SOLN
1.5000 g | Freq: Two times a day (BID) | INTRAVENOUS | Status: AC
  Administered 2015-08-30 – 2015-09-01 (×4): 1.5 g via INTRAVENOUS
  Filled 2015-08-30 (×4): qty 1.5

## 2015-08-30 MED ORDER — ONDANSETRON HCL 4 MG/2ML IJ SOLN
4.0000 mg | Freq: Four times a day (QID) | INTRAMUSCULAR | Status: DC | PRN
  Administered 2015-09-01: 4 mg via INTRAVENOUS
  Filled 2015-08-30: qty 2

## 2015-08-30 MED ORDER — ACETAMINOPHEN 650 MG RE SUPP
650.0000 mg | Freq: Once | RECTAL | Status: AC
  Administered 2015-08-30: 650 mg via RECTAL

## 2015-08-30 MED ORDER — SUCCINYLCHOLINE CHLORIDE 200 MG/10ML IV SOSY
PREFILLED_SYRINGE | INTRAVENOUS | Status: AC
  Filled 2015-08-30: qty 10

## 2015-08-30 MED ORDER — ALBUMIN HUMAN 5 % IV SOLN
INTRAVENOUS | Status: DC | PRN
  Administered 2015-08-30 (×3): via INTRAVENOUS

## 2015-08-30 MED ORDER — MILRINONE LACTATE IN DEXTROSE 20-5 MG/100ML-% IV SOLN
0.3000 ug/kg/min | INTRAVENOUS | Status: DC
  Administered 2015-08-30 – 2015-09-02 (×4): 0.25 ug/kg/min via INTRAVENOUS
  Administered 2015-09-03 – 2015-09-05 (×4): 0.3 ug/kg/min via INTRAVENOUS
  Filled 2015-08-30 (×9): qty 100

## 2015-08-30 MED ORDER — FENTANYL CITRATE (PF) 100 MCG/2ML IJ SOLN
25.0000 ug | INTRAMUSCULAR | Status: DC | PRN
  Administered 2015-08-30 – 2015-09-02 (×14): 25 ug via INTRAVENOUS
  Filled 2015-08-30 (×14): qty 2

## 2015-08-30 MED ORDER — LACTATED RINGERS IV SOLN
INTRAVENOUS | Status: DC | PRN
  Administered 2015-08-30: 08:00:00 via INTRAVENOUS

## 2015-08-30 MED ORDER — ACETAMINOPHEN 160 MG/5ML PO SOLN
650.0000 mg | Freq: Once | ORAL | Status: AC
Start: 2015-08-30 — End: 2015-08-30

## 2015-08-30 MED ORDER — MIDAZOLAM HCL 2 MG/2ML IJ SOLN
INTRAMUSCULAR | Status: AC
  Filled 2015-08-30: qty 2

## 2015-08-30 MED ORDER — ROCURONIUM BROMIDE 100 MG/10ML IV SOLN
INTRAVENOUS | Status: DC | PRN
  Administered 2015-08-30: 50 mg via INTRAVENOUS
  Administered 2015-08-30: 100 mg via INTRAVENOUS
  Administered 2015-08-30 (×2): 50 mg via INTRAVENOUS

## 2015-08-30 MED ORDER — CHLORHEXIDINE GLUCONATE 0.12% ORAL RINSE (MEDLINE KIT)
15.0000 mL | Freq: Two times a day (BID) | OROMUCOSAL | Status: DC
  Administered 2015-08-30 – 2015-08-31 (×2): 15 mL via OROMUCOSAL

## 2015-08-30 MED ORDER — VANCOMYCIN HCL IN DEXTROSE 1-5 GM/200ML-% IV SOLN
1000.0000 mg | Freq: Two times a day (BID) | INTRAVENOUS | Status: AC
  Administered 2015-08-30 – 2015-08-31 (×3): 1000 mg via INTRAVENOUS
  Filled 2015-08-30 (×4): qty 200

## 2015-08-30 MED ORDER — DOCUSATE SODIUM 100 MG PO CAPS: 200.0000 mg | ORAL_CAPSULE | Freq: Every day | ORAL | Status: DC

## 2015-08-30 MED ORDER — PHENYLEPHRINE HCL 10 MG/ML IJ SOLN
0.0000 ug/min | INTRAVENOUS | Status: DC
  Filled 2015-08-30: qty 2

## 2015-08-30 MED ORDER — ASPIRIN 81 MG PO CHEW
324.0000 mg | CHEWABLE_TABLET | Freq: Every day | ORAL | Status: DC
  Administered 2015-08-31 – 2015-09-01 (×2): 324 mg
  Filled 2015-08-30 (×2): qty 4

## 2015-08-30 MED ORDER — AMIODARONE HCL IN DEXTROSE 360-4.14 MG/200ML-% IV SOLN
30.0000 mg/h | INTRAVENOUS | Status: DC
  Administered 2015-08-30 – 2015-09-01 (×5): 30 mg/h via INTRAVENOUS
  Filled 2015-08-30 (×4): qty 200

## 2015-08-30 MED ORDER — PROTAMINE SULFATE 10 MG/ML IV SOLN
INTRAVENOUS | Status: DC | PRN
  Administered 2015-08-30 (×5): 20 mg via INTRAVENOUS

## 2015-08-30 MED ORDER — AMIODARONE HCL IN DEXTROSE 360-4.14 MG/200ML-% IV SOLN
60.0000 mg/h | INTRAVENOUS | Status: DC
  Filled 2015-08-30: qty 200

## 2015-08-30 MED ORDER — NOREPINEPHRINE BITARTRATE 1 MG/ML IV SOLN
0.0000 ug/min | INTRAVENOUS | Status: DC
  Administered 2015-08-31: 8 ug/min via INTRAVENOUS
  Filled 2015-08-30: qty 16

## 2015-08-30 MED ORDER — LACTATED RINGERS IV SOLN
INTRAVENOUS | Status: DC
  Administered 2015-08-31: 20 mL/h via INTRAVENOUS
  Administered 2015-08-31: 19:00:00 via INTRAVENOUS

## 2015-08-30 MED ORDER — DEXMEDETOMIDINE HCL IN NACL 400 MCG/100ML IV SOLN
0.1000 ug/kg/h | INTRAVENOUS | Status: DC
  Administered 2015-08-30 – 2015-08-31 (×2): 0.7 ug/kg/h via INTRAVENOUS
  Filled 2015-08-30: qty 50
  Filled 2015-08-30 (×2): qty 100
  Filled 2015-08-30: qty 50

## 2015-08-30 MED ORDER — VASOPRESSIN 20 UNIT/ML IV SOLN
0.0200 [IU]/min | INTRAVENOUS | Status: DC
  Filled 2015-08-30: qty 2

## 2015-08-30 MED ORDER — MIDAZOLAM HCL 10 MG/2ML IJ SOLN
INTRAMUSCULAR | Status: AC
  Filled 2015-08-30: qty 2

## 2015-08-30 MED ORDER — FENTANYL CITRATE (PF) 250 MCG/5ML IJ SOLN
INTRAMUSCULAR | Status: AC
  Filled 2015-08-30: qty 25

## 2015-08-30 MED ORDER — RIFAMPIN 300 MG PO CAPS
600.0000 mg | ORAL_CAPSULE | Freq: Once | ORAL | Status: AC
  Administered 2015-08-31: 600 mg via ORAL
  Filled 2015-08-30 (×2): qty 2

## 2015-08-30 MED ORDER — PANTOPRAZOLE SODIUM 40 MG PO TBEC
40.0000 mg | DELAYED_RELEASE_TABLET | Freq: Every day | ORAL | Status: DC
  Administered 2015-09-01 – 2015-09-19 (×19): 40 mg via ORAL
  Filled 2015-08-30 (×19): qty 1

## 2015-08-30 MED ORDER — LACTATED RINGERS IV SOLN: 500.0000 mL | Freq: Once | INTRAVENOUS | Status: DC | PRN

## 2015-08-30 MED ORDER — POTASSIUM CHLORIDE 10 MEQ/50ML IV SOLN
10.0000 meq | INTRAVENOUS | Status: AC
  Administered 2015-08-30 (×3): 10 meq via INTRAVENOUS

## 2015-08-30 MED ORDER — LIDOCAINE 2% (20 MG/ML) 5 ML SYRINGE
INTRAMUSCULAR | Status: DC | PRN
  Administered 2015-08-30: 100 mg via INTRAVENOUS

## 2015-08-30 MED ORDER — ACETAMINOPHEN 500 MG PO TABS
1000.0000 mg | ORAL_TABLET | Freq: Four times a day (QID) | ORAL | Status: AC
  Administered 2015-08-31 – 2015-09-04 (×12): 1000 mg via ORAL
  Filled 2015-08-30 (×12): qty 2

## 2015-08-30 MED ORDER — LACTATED RINGERS IV SOLN
INTRAVENOUS | Status: DC | PRN
  Administered 2015-08-30: 07:00:00 via INTRAVENOUS

## 2015-08-30 MED ORDER — FLUCONAZOLE IN SODIUM CHLORIDE 400-0.9 MG/200ML-% IV SOLN
400.0000 mg | Freq: Once | INTRAVENOUS | Status: AC
  Administered 2015-08-31: 400 mg via INTRAVENOUS
  Filled 2015-08-30: qty 200

## 2015-08-30 MED ORDER — MIDAZOLAM HCL 5 MG/5ML IJ SOLN
INTRAMUSCULAR | Status: DC | PRN
  Administered 2015-08-30: 1 mg via INTRAVENOUS
  Administered 2015-08-30 (×3): 2 mg via INTRAVENOUS
  Administered 2015-08-30: 1 mg via INTRAVENOUS
  Administered 2015-08-30 (×2): 2 mg via INTRAVENOUS

## 2015-08-30 MED ORDER — ETOMIDATE 2 MG/ML IV SOLN
INTRAVENOUS | Status: AC
  Filled 2015-08-30: qty 10

## 2015-08-30 MED ORDER — HEPARIN SODIUM (PORCINE) 1000 UNIT/ML IJ SOLN
INTRAMUSCULAR | Status: DC | PRN
  Administered 2015-08-30: 40000 [IU] via INTRAVENOUS

## 2015-08-30 MED ORDER — SODIUM CHLORIDE 0.9 % IV SOLN
Freq: Once | INTRAVENOUS | Status: AC
  Administered 2015-08-30: 13:00:00 via INTRAVENOUS

## 2015-08-30 MED ORDER — SODIUM CHLORIDE 0.9 % IJ SOLN
INTRAMUSCULAR | Status: AC
  Filled 2015-08-30: qty 20

## 2015-08-30 MED ORDER — PROTAMINE SULFATE 10 MG/ML IV SOLN
INTRAVENOUS | Status: AC
  Filled 2015-08-30: qty 10

## 2015-08-30 MED ORDER — FAMOTIDINE IN NACL 20-0.9 MG/50ML-% IV SOLN
20.0000 mg | Freq: Two times a day (BID) | INTRAVENOUS | Status: AC
  Administered 2015-08-30 (×2): 20 mg via INTRAVENOUS
  Filled 2015-08-30: qty 50

## 2015-08-30 MED ORDER — ACETAMINOPHEN 160 MG/5ML PO SOLN
1000.0000 mg | Freq: Four times a day (QID) | ORAL | Status: AC
  Administered 2015-08-30 – 2015-09-01 (×3): 1000 mg
  Filled 2015-08-30 (×3): qty 40.6

## 2015-08-30 MED ORDER — MIDAZOLAM HCL 2 MG/2ML IJ SOLN
2.0000 mg | INTRAMUSCULAR | Status: DC | PRN
  Administered 2015-08-31 (×4): 2 mg via INTRAVENOUS
  Filled 2015-08-30 (×5): qty 2

## 2015-08-30 MED ORDER — POTASSIUM CHLORIDE 10 MEQ/50ML IV SOLN
10.0000 meq | Freq: Once | INTRAVENOUS | Status: AC
  Administered 2015-08-30: 10 meq via INTRAVENOUS

## 2015-08-30 MED ORDER — SODIUM CHLORIDE 0.9% FLUSH
3.0000 mL | Freq: Two times a day (BID) | INTRAVENOUS | Status: DC
  Administered 2015-08-31 – 2015-09-02 (×4): 3 mL via INTRAVENOUS
  Administered 2015-09-02: 10 mL via INTRAVENOUS
  Administered 2015-09-10 – 2015-09-14 (×3): 3 mL via INTRAVENOUS

## 2015-08-30 MED ORDER — TRAMADOL HCL 50 MG PO TABS
50.0000 mg | ORAL_TABLET | ORAL | Status: DC | PRN
  Administered 2015-09-01 (×2): 50 mg via ORAL
  Administered 2015-09-01 – 2015-09-03 (×5): 100 mg via ORAL
  Administered 2015-09-05 – 2015-09-06 (×4): 50 mg via ORAL
  Administered 2015-09-07: 100 mg via ORAL
  Administered 2015-09-08 – 2015-09-12 (×4): 50 mg via ORAL
  Administered 2015-09-13: 100 mg via ORAL
  Filled 2015-08-30 (×2): qty 2
  Filled 2015-08-30 (×5): qty 1
  Filled 2015-08-30 (×2): qty 2
  Filled 2015-08-30 (×6): qty 1
  Filled 2015-08-30 (×3): qty 2

## 2015-08-30 MED ORDER — ROCURONIUM BROMIDE 50 MG/5ML IV SOLN
INTRAVENOUS | Status: AC
Start: 2015-08-30 — End: 2015-08-30
  Filled 2015-08-30: qty 3

## 2015-08-30 MED ORDER — ETOMIDATE 2 MG/ML IV SOLN
INTRAVENOUS | Status: DC | PRN
  Administered 2015-08-30: 8 mg via INTRAVENOUS

## 2015-08-30 MED ORDER — ANTISEPTIC ORAL RINSE SOLUTION (CORINZ)
7.0000 mL | OROMUCOSAL | Status: DC
  Administered 2015-08-30 – 2015-08-31 (×11): 7 mL via OROMUCOSAL

## 2015-08-30 MED ORDER — ASPIRIN 300 MG RE SUPP: 300.0000 mg | Freq: Every day | RECTAL | Status: DC

## 2015-08-30 MED ORDER — HEMOSTATIC AGENTS (NO CHARGE) OPTIME
TOPICAL | Status: DC | PRN
  Administered 2015-08-30 (×3): 1 via TOPICAL

## 2015-08-30 MED ORDER — NOREPINEPHRINE BITARTRATE 1 MG/ML IV SOLN
0.0000 ug/min | Freq: Once | INTRAVENOUS | Status: DC
  Filled 2015-08-30: qty 16

## 2015-08-30 MED ORDER — LACTATED RINGERS IV SOLN: INTRAVENOUS | Status: DC

## 2015-08-30 MED ORDER — GELATIN ABSORBABLE MT POWD
OROMUCOSAL | Status: DC | PRN
  Administered 2015-08-30 (×4): via TOPICAL

## 2015-08-30 MED ORDER — CHLORHEXIDINE GLUCONATE 0.12 % MT SOLN
15.0000 mL | OROMUCOSAL | Status: AC
  Administered 2015-08-30: 15 mL via OROMUCOSAL

## 2015-08-30 MED ORDER — EPINEPHRINE HCL 1 MG/ML IJ SOLN
0.0000 ug/min | INTRAVENOUS | Status: DC
  Administered 2015-08-30 – 2015-08-31 (×2): 5 ug/min via INTRAVENOUS
  Administered 2015-09-01 (×2): 2 ug/min via INTRAVENOUS
  Filled 2015-08-30 (×5): qty 4

## 2015-08-30 MED ORDER — INSULIN REGULAR BOLUS VIA INFUSION
0.0000 [IU] | Freq: Three times a day (TID) | INTRAVENOUS | Status: DC
  Filled 2015-08-30: qty 10

## 2015-08-30 MED ORDER — DEXTROSE 50 % IV SOLN
INTRAVENOUS | Status: AC
  Administered 2015-08-30: 15 mL
  Filled 2015-08-30: qty 50

## 2015-08-30 MED ORDER — BISACODYL 5 MG PO TBEC
10.0000 mg | DELAYED_RELEASE_TABLET | Freq: Every day | ORAL | Status: DC
  Administered 2015-09-01 – 2015-09-19 (×13): 10 mg via ORAL
  Filled 2015-08-30 (×15): qty 2

## 2015-08-30 MED ORDER — SODIUM CHLORIDE 0.9% FLUSH: 3.0000 mL | INTRAVENOUS | Status: DC | PRN

## 2015-08-30 MED ORDER — SODIUM CHLORIDE 0.9 % IR SOLN
Status: DC | PRN
Start: 2015-08-30 — End: 2015-08-30
  Administered 2015-08-30: 6000 mL
  Administered 2015-08-30: 1000 mL

## 2015-08-30 MED FILL — Heparin Sodium (Porcine) Inj 1000 Unit/ML: INTRAMUSCULAR | Qty: 30 | Status: AC

## 2015-08-30 MED FILL — Potassium Chloride Inj 2 mEq/ML: INTRAVENOUS | Qty: 40 | Status: AC

## 2015-08-30 MED FILL — Magnesium Sulfate Inj 50%: INTRAMUSCULAR | Qty: 10 | Status: AC

## 2015-08-30 MED FILL — Heparin Sodium (Porcine) Inj 1000 Unit/ML: INTRAMUSCULAR | Qty: 10 | Status: AC

## 2015-08-30 MED FILL — Mannitol IV Soln 20%: INTRAVENOUS | Qty: 500 | Status: AC

## 2015-08-30 MED FILL — Sodium Bicarbonate IV Soln 8.4%: INTRAVENOUS | Qty: 50 | Status: AC

## 2015-08-30 MED FILL — Sodium Chloride IV Soln 0.9%: INTRAVENOUS | Qty: 2000 | Status: AC

## 2015-08-30 MED FILL — Electrolyte-R (PH 7.4) Solution: INTRAVENOUS | Qty: 3000 | Status: AC

## 2015-08-30 SURGICAL SUPPLY — 122 items
ADAPTER CARDIO PERF ANTE/RETRO (ADAPTER) IMPLANT
ADAPTER DLP PERFUSION .25INX2I (MISCELLANEOUS) ×4 IMPLANT
ANTEGRADE CPLG (MISCELLANEOUS) IMPLANT
ATTRACTOMAT 16X20 MAGNETIC DRP (DRAPES) IMPLANT
BAG DECANTER FOR FLEXI CONT (MISCELLANEOUS) ×12 IMPLANT
BLADE STERNUM SYSTEM 6 (BLADE) ×4 IMPLANT
BLADE SURG 12 STRL SS (BLADE) ×4 IMPLANT
BLADE SURG 15 STRL LF DISP TIS (BLADE) ×2 IMPLANT
BLADE SURG 15 STRL SS (BLADE) ×2
CANISTER SUCTION 2500CC (MISCELLANEOUS) ×4 IMPLANT
CANNULA ARTERIAL NVNT 3/8 20FR (MISCELLANEOUS) ×4 IMPLANT
CANNULA MC2 2 STG 36/46 NON-V (CANNULA) ×2 IMPLANT
CANNULA VENOUS 2 STG 34/46 (CANNULA) ×2
CANNULA VENOUS LOW PROF 34X46 (CANNULA) ×4 IMPLANT
CATH CPB KIT VANTRIGT (MISCELLANEOUS) IMPLANT
CATH FOLEY 16FR TEMP PROBE (CATHETERS) ×4 IMPLANT
CATH FOLEY 2WAY SLVR  5CC 14FR (CATHETERS) ×2
CATH FOLEY 2WAY SLVR 5CC 14FR (CATHETERS) ×2 IMPLANT
CATH HEART VENT LEFT (CATHETERS) IMPLANT
CATH HYDRAGLIDE XL THORACIC (CATHETERS) ×4 IMPLANT
CATH ROBINSON RED A/P 18FR (CATHETERS) ×8 IMPLANT
CATH THORACIC 28FR (CATHETERS) IMPLANT
CATH THORACIC 36FR RT ANG (CATHETERS) ×8 IMPLANT
CHLORAPREP W/TINT 26ML (MISCELLANEOUS) ×4 IMPLANT
CONN ST 1/4X3/8  BEN (MISCELLANEOUS) ×2
CONN ST 1/4X3/8 BEN (MISCELLANEOUS) ×2 IMPLANT
CONT SPEC 4OZ CLIKSEAL STRL BL (MISCELLANEOUS) ×8 IMPLANT
COVER SURGICAL LIGHT HANDLE (MISCELLANEOUS) ×4 IMPLANT
CRADLE DONUT ADULT HEAD (MISCELLANEOUS) ×4 IMPLANT
DRAIN CHANNEL 28F RND 3/8 FF (WOUND CARE) IMPLANT
DRAIN CHANNEL 32F RND 10.7 FF (WOUND CARE) ×8 IMPLANT
DRAPE BILATERAL SPLIT (DRAPES) ×4 IMPLANT
DRAPE CV SPLIT W-CLR ANES SCRN (DRAPES) ×4 IMPLANT
DRAPE INCISE IOBAN 66X45 STRL (DRAPES) ×4 IMPLANT
DRAPE PROXIMA HALF (DRAPES) ×4 IMPLANT
DRAPE SLUSH/WARMER DISC (DRAPES) ×4 IMPLANT
DRSG AQUACEL AG ADV 3.5X14 (GAUZE/BANDAGES/DRESSINGS) ×4 IMPLANT
ELECT BLADE 4.0 EZ CLEAN MEGAD (MISCELLANEOUS) ×4
ELECT BLADE 6.5 EXT (BLADE) ×4 IMPLANT
ELECT CAUTERY BLADE 6.4 (BLADE) ×8 IMPLANT
ELECT REM PT RETURN 9FT ADLT (ELECTROSURGICAL) ×4
ELECTRODE BLDE 4.0 EZ CLN MEGD (MISCELLANEOUS) ×2 IMPLANT
ELECTRODE REM PT RTRN 9FT ADLT (ELECTROSURGICAL) ×2 IMPLANT
FELT TEFLON 6X6 (MISCELLANEOUS) ×4 IMPLANT
GAUZE SPONGE 4X4 12PLY STRL (GAUZE/BANDAGES/DRESSINGS) ×8 IMPLANT
GAUZE XEROFORM 1X8 LF (GAUZE/BANDAGES/DRESSINGS) ×8 IMPLANT
GLOVE BIO SURGEON STRL SZ 6.5 (GLOVE) ×9 IMPLANT
GLOVE BIO SURGEON STRL SZ7 (GLOVE) ×24 IMPLANT
GLOVE BIO SURGEON STRL SZ7.5 (GLOVE) ×12 IMPLANT
GLOVE BIO SURGEONS STRL SZ 6.5 (GLOVE) ×3
GLOVE BIOGEL PI IND STRL 6 (GLOVE) ×8 IMPLANT
GLOVE BIOGEL PI INDICATOR 6 (GLOVE) ×8
GOWN STRL REUS W/ TWL LRG LVL3 (GOWN DISPOSABLE) ×8 IMPLANT
GOWN STRL REUS W/ TWL XL LVL3 (GOWN DISPOSABLE) ×4 IMPLANT
GOWN STRL REUS W/TWL LRG LVL3 (GOWN DISPOSABLE) ×8
GOWN STRL REUS W/TWL XL LVL3 (GOWN DISPOSABLE) ×4
HEMOSTAT POWDER SURGIFOAM 1G (HEMOSTASIS) ×16 IMPLANT
HEMOSTAT SURGICEL 2X14 (HEMOSTASIS) IMPLANT
HM II SYSTEM CONTROLLER ×4 IMPLANT
INSERT FOGARTY XLG (MISCELLANEOUS) IMPLANT
IV CATH 18G X1.75 CATHLON (IV SOLUTION) ×4 IMPLANT
IV NS 1000ML (IV SOLUTION) ×6
IV NS 1000ML BAXH (IV SOLUTION) ×6 IMPLANT
KIT BASIN OR (CUSTOM PROCEDURE TRAY) ×4 IMPLANT
KIT HM II VENT ASSIST DEV BP (Prosthesis & Implant Heart) ×4 IMPLANT
KIT ROOM TURNOVER OR (KITS) ×4 IMPLANT
KIT SUCTION CATH 14FR (SUCTIONS) ×4 IMPLANT
LEAD PACING MYOCARDI (MISCELLANEOUS) IMPLANT
LINE VENT (MISCELLANEOUS) ×4 IMPLANT
NEEDLE 22X1 1/2 (OR ONLY) (NEEDLE) ×4 IMPLANT
NS IRRIG 1000ML POUR BTL (IV SOLUTION) ×24 IMPLANT
PACK OPEN HEART (CUSTOM PROCEDURE TRAY) ×4 IMPLANT
PAD ARMBOARD 7.5X6 YLW CONV (MISCELLANEOUS) ×8 IMPLANT
PAD DEFIB R2 (MISCELLANEOUS) ×4 IMPLANT
PUNCH AORTIC ROTATE 4.5MM 8IN (MISCELLANEOUS) ×4 IMPLANT
SEALANT SURG COSEAL 8ML (VASCULAR PRODUCTS) ×4 IMPLANT
SET CARDIOPLEGIA MPS 5001102 (MISCELLANEOUS) ×4 IMPLANT
SHEATH AVANTI 11CM 5FR (MISCELLANEOUS) IMPLANT
SPONGE GAUZE 4X4 12PLY STER LF (GAUZE/BANDAGES/DRESSINGS) ×4 IMPLANT
SPONGE LAP 18X18 X RAY DECT (DISPOSABLE) ×4 IMPLANT
STOPCOCK 4 WAY LG BORE MALE ST (IV SETS) ×4 IMPLANT
SUCKER INTRACARDIAC WEIGHTED (SUCKER) ×4 IMPLANT
SUT ETHIBOND 2 0 SH (SUTURE) ×10
SUT ETHIBOND 2 0 SH 36X2 (SUTURE) ×10 IMPLANT
SUT ETHIBOND 5 LR DA (SUTURE) ×8 IMPLANT
SUT ETHIBOND NAB MH 2-0 36IN (SUTURE) ×56 IMPLANT
SUT PROLENE 3 0 RB 1 (SUTURE) IMPLANT
SUT PROLENE 3 0 SH DA (SUTURE) ×8 IMPLANT
SUT PROLENE 4 0 RB 1 (SUTURE) ×12
SUT PROLENE 4-0 RB1 .5 CRCL 36 (SUTURE) ×12 IMPLANT
SUT PROLENE 5 0 C 1 36 (SUTURE) ×12 IMPLANT
SUT PROLENE 5 0 C1 (SUTURE) ×4 IMPLANT
SUT PROLENE 6 0 C 1 24 (SUTURE) ×8 IMPLANT
SUT SILK  1 MH (SUTURE) ×12
SUT SILK 1 MH (SUTURE) ×12 IMPLANT
SUT SILK 1 TIES 10X30 (SUTURE) ×4 IMPLANT
SUT SILK 2 0 SH CR/8 (SUTURE) ×8 IMPLANT
SUT SILK 3 0 SH CR/8 (SUTURE) ×4 IMPLANT
SUT STEEL 6MS V (SUTURE) ×8 IMPLANT
SUT STEEL SZ 6 DBL 3X14 BALL (SUTURE) ×4 IMPLANT
SUT TEM PAC WIRE 2 0 SH (SUTURE) IMPLANT
SUT VIC AB 1 CTX 18 (SUTURE) ×4 IMPLANT
SUT VIC AB 1 CTX 36 (SUTURE) ×4
SUT VIC AB 1 CTX36XBRD ANBCTR (SUTURE) ×4 IMPLANT
SUT VIC AB 2-0 CTX 27 (SUTURE) ×8 IMPLANT
SUT VIC AB 3-0 SH 8-18 (SUTURE) ×4 IMPLANT
SUT VIC AB 3-0 X1 27 (SUTURE) ×12 IMPLANT
SUT VICRYL 2 TP 1 (SUTURE) IMPLANT
SYR 50ML LL SCALE MARK (SYRINGE) ×4 IMPLANT
SYSTEM SAHARA CHEST DRAIN ATS (WOUND CARE) ×8 IMPLANT
TAPE CLOTH SURG 4X10 WHT LF (GAUZE/BANDAGES/DRESSINGS) ×4 IMPLANT
TAPE PAPER 2X10 WHT MICROPORE (GAUZE/BANDAGES/DRESSINGS) ×4 IMPLANT
TOWEL OR 17X24 6PK STRL BLUE (TOWEL DISPOSABLE) ×8 IMPLANT
TOWEL OR 17X26 10 PK STRL BLUE (TOWEL DISPOSABLE) ×8 IMPLANT
TRAY CATH LUMEN 1 20CM STRL (SET/KITS/TRAYS/PACK) ×4 IMPLANT
TRAY FOLEY IC TEMP SENS 14FR (CATHETERS) IMPLANT
TUBE CONNECTING 12'X1/4 (SUCTIONS) ×1
TUBE CONNECTING 12X1/4 (SUCTIONS) ×3 IMPLANT
UNDERPAD 30X30 INCONTINENT (UNDERPADS AND DIAPERS) ×4 IMPLANT
VENT LEFT HEART 12002 (CATHETERS)
WATER STERILE IRR 1000ML POUR (IV SOLUTION) ×8 IMPLANT
YANKAUER SUCT BULB TIP NO VENT (SUCTIONS) ×4 IMPLANT

## 2015-08-30 NOTE — Op Note (Signed)
NAMEMarland Ramsey  GAY, RAPE NO.:  [id number]  MEDICAL RECORD NO.:  [id number]  LOCATION:  2S10C                        FACILITY:  [facility]  PHYSICIAN:  Kerin Perna, M.D.  DATE OF BIRTH:  08/25/1940  DATE OF PROCEDURE:  08/30/2015 DATE OF DISCHARGE:                              OPERATIVE REPORT   OPERATION:  Implantation of HeartMate II, left ventricular assist device.  SURGEON:  Kerin Perna, M.D.  Mammie LorenzoEvelene Croon, M.D.  ANESTHESIA:  General by Dr. Zenon Mayo.  PREOPERATIVE DIAGNOSES:  Acute on chronic systolic heart failure, nonischemic cardiomyopathy, preoperative cardiogenic shock.  POSTOPERATIVE DIAGNOSES:  Acute on chronic systolic heart failure, nonischemic cardiomyopathy, preoperative cardiogenic shock.  CLINICAL NOTE:  The patient is a 75 year old, Caucasian male, with several years of low ejection fraction from nonischemic cardiomyopathy- familial cardiomyopathy.  He was admitted to the hospital with severe fatigue, low cardiac output and was found to be in cardiogenic shock. He was placed on inotropic support, a pulmonary artery catheter was placed, and he was resuscitated.  He completed a full evaluation for left ventricular assist device therapy as destination therapy after a full evaluation by the mechanical assist circulatory support team.  I discussed the procedure of LVAD implantation with the patient and his family including the indications, benefits, alternatives and risks.  We discussed the use of general anesthesia and cardiopulmonary bypass, the location of the surgical incisions, and the expected postoperative hospital recovery.  He understood that he would have expected 2-week recovery in the hospital before he would be strong enough to be discharged.  We discussed the risks of stroke, bleeding, blood transfusion requirement, postoperative pulmonary problems including pleural effusion, postoperative infection,  postoperative arrhythmias, and death.  After reviewing these issues, he demonstrated his understanding and agreed to proceed with surgery under what I felt was an informed consent.  OPERATIVE FINDINGS: 1. Severe pulmonary hypertension with PA pressures of 75/45 after     being anesthetized. 2. Adequate post implant RV function. 3. Mild tricuspid regurgitation related to the indwelling AICD leads     through the tricuspid valve.  OPERATIVE PROCEDURE:  The patient was brought to the operating room, placed supine on the operating table.  General anesthesia was induced under invasive hemodynamic monitoring.  A transesophageal echo probe was placed by the anesthesiologist.  The chest, abdomen, and legs were prepped and draped as a sterile field.  A proper time-out was performed. A sternal incision was made.  The sternal elevating retractor was used to create a pocket for the VAD pump by taking down the left hemidiaphragm and separating the posterior rectus sheath from the peritoneal membrane.  The retractor was then placed and the pericardium was opened and suspended.  There was a large pericardial effusion and a large right pleural effusion which were drained.  Heparin was administered. Pursestrings were placed in the ascending aorta and right atrium.  The ACT was therapeutic.  The patient was cannulated, but not placed on bypass.  A partial occluding clamp was placed on the ascending aorta and an aortotomy was performed.  The outflow graft was sewn end-to-side with a running 4-0 Prolene, after it was cut to the appropriate  length and taper.  This was then clamped, a light CoSeal medical adhesive was placed over the suture line and the partial occluding clamp was removed. The graft was placed to the side.  The patient was then placed on cardiopulmonary bypass.  The heart was elevated to expose the LV apex.  Eleven blade incision was made just superior and medial to the LV apex  dimple.  The coring device was then used to core out a portion of the myocardium.  Trabeculae were excised. No thrombus was noted in the ventricle.  CO2 was insufflated into the field.  Next, circumferentially 2-0 Ethibond on MH needle with pledgeted sutures were placed around the circumference of the LV apical opening.  Thirteen total mattress sutures were placed.  Next, the silastic Dacron ring was then brought down through the sutures to the LV apex and the sutures were all tied.  There was no spacing issues or evidence of incomplete confirmation of the sewing ring that was lead to bleeding.  A light thick layer of CoSeal was then placed around the sewing ring and the inner stent was removed.  Next, the patient was placed in deep Trendelenburg and volume was left into the pump, and the lungs were expanded.  This filled the heart so that the HeartMate II inflow cannula could be inserted into the silastic sleeve incorporating or entering any air into the heart.  The incorporated tie was used first to secure the inflow cannula inside the silastic sleeve.  Then, a tie band was tightened and then heavy Ethibond #4 sutures above and below the tie band were secured.  The heart was then decompressed.  A driveline was then brought through the counter incision to the right of the umbilicus and out the right upper quadrant exit site.  The heart was then refilled to prevent any air entrapment, and the outflow connection to the outflow graft was then completed while tightening down the connector screw cap.  The clamp was left on the outflow graft.  The pump was then placed in the pocket, where it had a very nice orientation and fit.  The driveline was connected to the controller, the lungs were reinflated and inhaled nitric oxide was started.  Low-dose inotropes were started including vasopressin, epinephrine, and norepinephrine.  We then let volume into the patient from the pump and  started the LVAD once the heart was filled at half flow on cardiopulmonary bypass.  The patient was then transitioned from cardiopulmonary bypass to full LVAD support at a rpm of 8800.  Echo showed good orientation of inflow cannula, good orientation of the interventricular septum and adequate RV function.  Hemodynamics were stable.  The venous cannula was removed. There was no air in the heart on echo.  Cardiac output was normal. Protamine was administered without adverse reaction.  Aortic cannula was then removed.  After reversal of heparin, there was still significant diffuse coagulopathy and the patient was found to have a low platelet count at 80,000.  He was given a unit of platelets and FFP and this improved coagulation function.  The superior pericardial fat was closed over the aorta and outflow graft.  The bend relief guard cap was then applied and tightened and secured on the outflow graft site of the pump.  The outflow graft elbow was then secured to the posterior rectus sheath with a large #1 Vicryl suture.  Tubes were then placed in both pleural spaces, the anterior mediastinum, and into the VAD pocket and  brought out through separate incisions and secured.  The sternum was closed with interrupted steel wire.  The patient remained stable.  The pectoralis fascia was closed with interrupted #1 Vicryl on the lower part of the incision and a running #1 Vicryl on the area above the sternum.  The subcutaneous and skin were closed with running Vicryl.  The counter incision to the right of the umbilicus was closed in layers using Vicryl, and the exit site of the power cord was closed with subcutaneous Vicryl and 3-0 nylon on the skin.  Sterile dressings were applied, and the patient returned to the ICU in stable condition.  Total cardiopulmonary bypass time was 65 minutes.     Kerin Perna, M.D.     PV/MEDQ  D:  08/30/2015  T:  08/30/2015  Job:  [id number]

## 2015-08-30 NOTE — Progress Notes (Signed)
EKG CRITICAL VALUE     12 lead EKG performed.  Critical value noted. Shirlee More , RN notified.   Karess Harner L, CCT 08/30/2015 2:38 PM

## 2015-08-30 NOTE — Transfer of Care (Signed)
Immediate Anesthesia Transfer of Care Note  Patient: [name]  Procedure(s) Performed: Procedure(s): INSERTION OF IMPLANTABLE LEFT VENTRICULAR ASSIST DEVICE (N/A) INTRAOPERATIVE TRANSESOPHAGEAL ECHOCARDIOGRAM (N/A)  Patient Location: SICU  Anesthesia Type:General  Level of Consciousness: Patient remains intubated per anesthesia plan  Airway & Oxygen Therapy: Patient remains intubated per anesthesia plan and Patient placed on Ventilator (see vital sign flow sheet for setting)  Post-op Assessment: Post -op Vital signs reviewed and stable  Post vital signs: Reviewed and stable  Last Vitals:  Filed Vitals:   08/30/15 0400 08/30/15 0500  BP: 91/67 110/68  Pulse: 63 64  Temp: 36.9 C   Resp: 23 25   HR 90 paced, RR 16, MAP 84, Sats 100% on ventilator Last Pain:  Filed Vitals:   08/30/15 0526  PainSc: Asleep      Patients Stated Pain Goal: 0 (08/24/15 1315)  Complications: No apparent anesthesia complications

## 2015-08-30 NOTE — OR Nursing (Signed)
12:25- 45 minute call to SICU

## 2015-08-30 NOTE — Anesthesia Postprocedure Evaluation (Signed)
Anesthesia Post Note  Patient: DEACAN SCHARFF  Procedure(s) Performed: Procedure(s) (LRB): INSERTION OF IMPLANTABLE LEFT VENTRICULAR ASSIST DEVICE (N/A) INTRAOPERATIVE TRANSESOPHAGEAL ECHOCARDIOGRAM (N/A)  Patient location during evaluation: SICU Anesthesia Type: General Level of consciousness: sedated Pain management: pain level controlled Vital Signs Assessment: post-procedure vital signs reviewed and stable Respiratory status: patient remains intubated per anesthesia plan Cardiovascular status: stable Anesthetic complications: no    Last Vitals:  Filed Vitals:   08/30/15 1530 08/30/15 1545  BP:    Pulse: 89 89  Temp: 36.3 C 36.3 C  Resp: 18 18    Last Pain:  Filed Vitals:   08/30/15 1545  PainSc: Asleep                 Kennieth Rad

## 2015-08-30 NOTE — Progress Notes (Signed)
Pharmacy: Vancomycin  74yom s/p LVAD today to continue vancomycin post-operatively for prophylaxis. Received 1250mg  in the OR at 0751. Pre-op renal function stable.  Plan: Vancomycin 1g IV q12 x 48h Will not plan to check a level given short LOT   Louie Casa, PharmD, BCPS 08/30/2015, 2:17 PM

## 2015-08-30 NOTE — Anesthesia Procedure Notes (Addendum)
Central Venous Catheter Insertion Performed by: anesthesiologist Patient location: Pre-op. Preanesthetic checklist: patient identified, IV checked, site marked, risks and benefits discussed, surgical consent, monitors and equipment checked, pre-op evaluation, timeout performed and anesthesia consent Position: Trendelenburg Lidocaine 1% used for infiltration Landmarks identified and Seldinger technique used Catheter size: 8.5 Fr Central line was placed.Sheath introducer Swan type and PA catheter depth:thermodilation and 50PA Cath depth:50 Procedure performed using ultrasound guided technique. Attempts: 1 Following insertion, line sutured and dressing applied. Post procedure assessment: blood return through all ports, free fluid flow and no air. Patient tolerated the procedure well with no immediate complications.    Central Venous Catheter Insertion Performed by: anesthesiologist Patient location: Pre-op. Preanesthetic checklist: patient identified, IV checked, site marked, risks and benefits discussed, surgical consent, monitors and equipment checked, pre-op evaluation, timeout performed and anesthesia consent Position: Trendelenburg Lidocaine 1% used for infiltration Landmarks identified and Seldinger technique used Catheter size: 8 Fr Central line was placed.Double lumen Procedure performed using ultrasound guided technique. Attempts: 1 Following insertion, dressing applied, line sutured and Biopatch. Post procedure assessment: blood return through all ports. Patient tolerated the procedure well with no immediate complications.    Procedure Name: Intubation Date/Time: 08/30/2015 8:06 AM Performed by: Glo Herring B Pre-anesthesia Checklist: Patient identified, Emergency Drugs available, Timeout performed, Suction available and Patient being monitored Patient Re-evaluated:Patient Re-evaluated prior to inductionOxygen Delivery Method: Circle system utilized Preoxygenation:  Pre-oxygenation with 100% oxygen Intubation Type: IV induction Ventilation: Mask ventilation without difficulty and Oral airway inserted - appropriate to patient size Laryngoscope Size: Mac and 4 Grade View: Grade I Tube type: Subglottic suction tube Tube size: 7.5 mm Number of attempts: 1 Airway Equipment and Method: Stylet Placement Confirmation: ETT inserted through vocal cords under direct vision,  positive ETCO2,  CO2 detector and breath sounds checked- equal and bilateral Secured at: 22 cm Tube secured with: Tape Dental Injury: Teeth and Oropharynx as per pre-operative assessment

## 2015-08-30 NOTE — Brief Op Note (Signed)
08/21/2015 - 08/30/2015  5:57 PM  PATIENT:  Joan Flores  75 y.o. male  PRE-OPERATIVE DIAGNOSIS:  Heart Failure  POST-OPERATIVE DIAGNOSIS:  Heart Failure  PROCEDURE:  Procedure(s): INSERTION OF IMPLANTABLE LEFT VENTRICULAR ASSIST DEVICE (N/A) INTRAOPERATIVE TRANSESOPHAGEAL ECHOCARDIOGRAM (N/A)  SURGEON:  Surgeon(s) and Role:    * Kerin Perna, MD - Primary    * Alleen Borne, MD  PHYSICIAN ASSISTANT: None  ASSISTANTS: none   ANESTHESIA:   general  EBL:  Total I/O In: 5326.4 [I.V.:2452.4; Blood:2044; NG/GT:30; IV Piggyback:800] Out: 2885 [Urine:725; Blood:1300; Chest Tube:860]  BLOOD ADMINISTERED:none  DRAINS: Left PT right PT anterior mediastinal, VAD pocket drain  LOCAL MEDICATIONS USED:  NONE  SPECIMEN:  Excision  of LV apex for cannulation site DISPOSITION OF SPECIMEN:  PATHOLOGY  COUNTS:  YES  TOURNIQUET:  * No tourniquets in log *  DICTATION: .Dragon Dictation  PLAN OF CARE: Transfer to SICU  PATIENT DISPOSITION:  ICU - intubated and hemodynamically stable.   Delay start of Pharmacological VTE agent (>24hrs) due to surgical blood loss or risk of bleeding: yes

## 2015-08-30 NOTE — OR Nursing (Signed)
13:10 - 20 minute call to SICU charge nurse 

## 2015-08-30 NOTE — Progress Notes (Signed)
  Echocardiogram Echocardiogram Transesophageal has been performed.  Leta Jungling M 08/30/2015, 8:47 AM

## 2015-08-31 ENCOUNTER — Inpatient Hospital Stay (HOSPITAL_COMMUNITY): Payer: Medicare Other

## 2015-08-31 DIAGNOSIS — Z95811 Presence of heart assist device: Secondary | ICD-10-CM

## 2015-08-31 LAB — CARBOXYHEMOGLOBIN
Carboxyhemoglobin: 1.9 % — ABNORMAL HIGH (ref 0.5–1.5)
Carboxyhemoglobin: 2 % — ABNORMAL HIGH (ref 0.5–1.5)
Carboxyhemoglobin: 2.2 % — ABNORMAL HIGH (ref 0.5–1.5)
Methemoglobin: 0.6 % (ref 0.0–1.5)
Methemoglobin: 0.7 % (ref 0.0–1.5)
Methemoglobin: 0.7 % (ref 0.0–1.5)
O2 Saturation: 75 %
O2 Saturation: 100 %
O2 Saturation: 76.2 %
Total hemoglobin: 9.2 g/dL — ABNORMAL LOW (ref 13.5–18.0)
Total hemoglobin: 8.1 g/dL — ABNORMAL LOW (ref 13.5–18.0)
Total hemoglobin: 8.4 g/dL — ABNORMAL LOW (ref 13.5–18.0)

## 2015-08-31 LAB — POCT I-STAT 3, ART BLOOD GAS (G3+)
Acid-Base Excess: 3 mmol/L — ABNORMAL HIGH (ref 0.0–2.0)
Acid-Base Excess: 3 mmol/L — ABNORMAL HIGH (ref 0.0–2.0)
Acid-Base Excess: 3 mmol/L — ABNORMAL HIGH (ref 0.0–2.0)
BICARBONATE: 28.5 meq/L — AB (ref 20.0–24.0)
Bicarbonate: 28.1 mEq/L — ABNORMAL HIGH (ref 20.0–24.0)
Bicarbonate: 28.4 mEq/L — ABNORMAL HIGH (ref 20.0–24.0)
O2 Saturation: 100 %
O2 Saturation: 100 %
O2 Saturation: 100 %
PCO2 ART: 45.3 mmHg — AB (ref 35.0–45.0)
PCO2 ART: 44.4 mmHg (ref 35.0–45.0)
PCO2 ART: 47.9 mmHg — AB (ref 35.0–45.0)
PH ART: 7.402 (ref 7.350–7.450)
PH ART: 7.383 (ref 7.350–7.450)
PO2 ART: 176 mmHg — AB (ref 80.0–100.0)
PO2 ART: 172 mmHg — AB (ref 80.0–100.0)
Patient temperature: 37.3
Patient temperature: 37.2
Patient temperature: 98.8
TCO2: 29 mmol/L (ref 0–100)
TCO2: 30 mmol/L (ref 0–100)
TCO2: 30 mmol/L (ref 0–100)
pH, Arterial: 7.414 (ref 7.350–7.450)
pO2, Arterial: 227 mmHg — ABNORMAL HIGH (ref 80.0–100.0)

## 2015-08-31 LAB — PREPARE PLATELET PHERESIS
Unit division: 0
Unit division: 0

## 2015-08-31 LAB — CBC WITH DIFFERENTIAL/PLATELET
Basophils Absolute: 0 10*3/uL (ref 0.0–0.1)
Basophils Relative: 0 %
Eosinophils Absolute: 0 10*3/uL (ref 0.0–0.7)
Eosinophils Relative: 0 %
HCT: 26.3 % — ABNORMAL LOW (ref 39.0–52.0)
Hemoglobin: 8.2 g/dL — ABNORMAL LOW (ref 13.0–17.0)
Lymphocytes Relative: 7 %
Lymphs Abs: 0.5 10*3/uL — ABNORMAL LOW (ref 0.7–4.0)
MCH: 26.1 pg (ref 26.0–34.0)
MCHC: 31.2 g/dL (ref 30.0–36.0)
MCV: 83.8 fL (ref 78.0–100.0)
Monocytes Absolute: 0.5 10*3/uL (ref 0.1–1.0)
Monocytes Relative: 7 %
Neutro Abs: 6.5 10*3/uL (ref 1.7–7.7)
Neutrophils Relative %: 86 %
Platelets: 110 10*3/uL — ABNORMAL LOW (ref 150–400)
RBC: 3.14 MIL/uL — ABNORMAL LOW (ref 4.22–5.81)
RDW: 16 % — ABNORMAL HIGH (ref 11.5–15.5)
WBC: 7.5 10*3/uL (ref 4.0–10.5)

## 2015-08-31 LAB — PREPARE FRESH FROZEN PLASMA
Unit division: 0
Unit division: 0
Unit division: 0
Unit division: 0
Unit division: 0
Unit division: 0

## 2015-08-31 LAB — GLUCOSE, CAPILLARY
GLUCOSE-CAPILLARY: 100 mg/dL — AB (ref 65–99)
GLUCOSE-CAPILLARY: 102 mg/dL — AB (ref 65–99)
GLUCOSE-CAPILLARY: 106 mg/dL — AB (ref 65–99)
GLUCOSE-CAPILLARY: 127 mg/dL — AB (ref 65–99)
GLUCOSE-CAPILLARY: 141 mg/dL — AB (ref 65–99)
GLUCOSE-CAPILLARY: 167 mg/dL — AB (ref 65–99)
GLUCOSE-CAPILLARY: 98 mg/dL (ref 65–99)
Glucose-Capillary: 113 mg/dL — ABNORMAL HIGH (ref 65–99)
Glucose-Capillary: 109 mg/dL — ABNORMAL HIGH (ref 65–99)
Glucose-Capillary: 103 mg/dL — ABNORMAL HIGH (ref 65–99)
Glucose-Capillary: 148 mg/dL — ABNORMAL HIGH (ref 65–99)
Glucose-Capillary: 108 mg/dL — ABNORMAL HIGH (ref 65–99)
Glucose-Capillary: 79 mg/dL (ref 65–99)
Glucose-Capillary: 184 mg/dL — ABNORMAL HIGH (ref 65–99)

## 2015-08-31 LAB — BLOOD GAS, ARTERIAL
Acid-Base Excess: 4.1 mmol/L — ABNORMAL HIGH (ref 0.0–2.0)
Bicarbonate: 28.3 mEq/L — ABNORMAL HIGH (ref 20.0–24.0)
FIO2: 0.4
MECHVT: 650 mL
O2 Saturation: 99.8 %
PEEP: 8 cmH2O
Patient temperature: 98.6
RATE: 12 resp/min
TCO2: 29.7 mmol/L (ref 0–100)
pCO2 arterial: 44.1 mmHg (ref 35.0–45.0)
pH, Arterial: 7.423 (ref 7.350–7.450)
pO2, Arterial: 189 mmHg — ABNORMAL HIGH (ref 80.0–100.0)

## 2015-08-31 LAB — CBC
HCT: 26 % — ABNORMAL LOW (ref 39.0–52.0)
Hemoglobin: 8 g/dL — ABNORMAL LOW (ref 13.0–17.0)
MCH: 26.3 pg (ref 26.0–34.0)
MCHC: 30.8 g/dL (ref 30.0–36.0)
MCV: 85.5 fL (ref 78.0–100.0)
Platelets: 119 10*3/uL — ABNORMAL LOW (ref 150–400)
RBC: 3.04 MIL/uL — ABNORMAL LOW (ref 4.22–5.81)
RDW: 16.2 % — ABNORMAL HIGH (ref 11.5–15.5)
WBC: 7.8 10*3/uL (ref 4.0–10.5)

## 2015-08-31 LAB — COMPREHENSIVE METABOLIC PANEL
ALT: 26 U/L (ref 17–63)
AST: 74 U/L — ABNORMAL HIGH (ref 15–41)
Albumin: 3.2 g/dL — ABNORMAL LOW (ref 3.5–5.0)
Alkaline Phosphatase: 39 U/L (ref 38–126)
Anion gap: 7 (ref 5–15)
BUN: 18 mg/dL (ref 6–20)
CO2: 27 mmol/L (ref 22–32)
Calcium: 8.3 mg/dL — ABNORMAL LOW (ref 8.9–10.3)
Chloride: 100 mmol/L — ABNORMAL LOW (ref 101–111)
Creatinine, Ser: 0.86 mg/dL (ref 0.61–1.24)
GFR calc Af Amer: >60 mL/min (ref >60)
GFR calc non Af Amer: >60 mL/min (ref >60)
Glucose, Bld: 96 mg/dL (ref 65–99)
Potassium: 4.6 mmol/L (ref 3.5–5.1)
Sodium: 134 mmol/L — ABNORMAL LOW (ref 135–145)
Total Bilirubin: 3.2 mg/dL — ABNORMAL HIGH (ref 0.3–1.2)
Total Protein: 5.4 g/dL — ABNORMAL LOW (ref 6.5–8.1)

## 2015-08-31 LAB — POCT I-STAT, CHEM 8
BUN: 19 mg/dL (ref 6–20)
CHLORIDE: 96 mmol/L — AB (ref 101–111)
Calcium, Ion: 1.16 mmol/L (ref 1.13–1.30)
Creatinine, Ser: 0.7 mg/dL (ref 0.61–1.24)
GLUCOSE: 161 mg/dL — AB (ref 65–99)
HCT: 28 % — ABNORMAL LOW (ref 39.0–52.0)
Hemoglobin: 9.5 g/dL — ABNORMAL LOW (ref 13.0–17.0)
POTASSIUM: 4.7 mmol/L (ref 3.5–5.1)
Sodium: 132 mmol/L — ABNORMAL LOW (ref 135–145)
TCO2: 28 mmol/L (ref 0–100)

## 2015-08-31 LAB — CREATININE, SERUM
Creatinine, Ser: 0.86 mg/dL (ref 0.61–1.24)
GFR calc Af Amer: >60 mL/min (ref >60)
GFR calc non Af Amer: >60 mL/min (ref >60)

## 2015-08-31 LAB — LACTATE DEHYDROGENASE: LDH: 255 U/L — AB (ref 98–192)

## 2015-08-31 LAB — PHOSPHORUS: Phosphorus: 3.6 mg/dL (ref 2.5–4.6)

## 2015-08-31 LAB — PROTIME-INR
INR: 1.48 (ref 0.00–1.49)
PROTHROMBIN TIME: 18 s — AB (ref 11.6–15.2)

## 2015-08-31 LAB — MAGNESIUM
Magnesium: 2.5 mg/dL — ABNORMAL HIGH (ref 1.7–2.4)
Magnesium: 2.5 mg/dL — ABNORMAL HIGH (ref 1.7–2.4)

## 2015-08-31 LAB — BRAIN NATRIURETIC PEPTIDE: B Natriuretic Peptide: 1387 pg/mL — ABNORMAL HIGH (ref 0.0–100.0)

## 2015-08-31 MED ORDER — FUROSEMIDE 10 MG/ML IJ SOLN
20.0000 mg | Freq: Two times a day (BID) | INTRAMUSCULAR | Status: DC
  Administered 2015-09-01: 20 mg via INTRAVENOUS
  Filled 2015-08-31: qty 2

## 2015-08-31 MED ORDER — FUROSEMIDE 10 MG/ML IJ SOLN
20.0000 mg | Freq: Once | INTRAMUSCULAR | Status: AC
  Administered 2015-08-31: 20 mg via INTRAVENOUS
  Filled 2015-08-31: qty 2

## 2015-08-31 MED ORDER — WARFARIN - PHYSICIAN DOSING INPATIENT
Freq: Every day | Status: DC
  Administered 2015-09-05: 18:00:00

## 2015-08-31 MED ORDER — INSULIN ASPART 100 UNIT/ML ~~LOC~~ SOLN
0.0000 [IU] | SUBCUTANEOUS | Status: AC
  Administered 2015-08-31 (×2): 4 [IU] via SUBCUTANEOUS
  Administered 2015-08-31 – 2015-09-01 (×2): 3 [IU] via SUBCUTANEOUS
  Administered 2015-09-01: 4 [IU] via SUBCUTANEOUS
  Administered 2015-09-01 – 2015-09-02 (×5): 3 [IU] via SUBCUTANEOUS
  Administered 2015-09-02: 4 [IU] via SUBCUTANEOUS
  Administered 2015-09-02 (×2): 3 [IU] via SUBCUTANEOUS
  Administered 2015-09-03 (×2): 4 [IU] via SUBCUTANEOUS
  Administered 2015-09-03: 3 [IU] via SUBCUTANEOUS
  Administered 2015-09-03: 4 [IU] via SUBCUTANEOUS

## 2015-08-31 MED ORDER — WARFARIN SODIUM 2 MG PO TABS
2.0000 mg | ORAL_TABLET | Freq: Once | ORAL | Status: AC
  Administered 2015-08-31: 2 mg via ORAL
  Filled 2015-08-31: qty 1

## 2015-08-31 MED ORDER — ALBUMIN HUMAN 25 % IV SOLN
12.5000 g | Freq: Four times a day (QID) | INTRAVENOUS | Status: AC
  Administered 2015-08-31 – 2015-09-01 (×3): 12.5 g via INTRAVENOUS
  Filled 2015-08-31 (×3): qty 50

## 2015-08-31 MED ORDER — CETYLPYRIDINIUM CHLORIDE 0.05 % MT LIQD
7.0000 mL | Freq: Two times a day (BID) | OROMUCOSAL | Status: DC
  Administered 2015-09-01 – 2015-09-16 (×27): 7 mL via OROMUCOSAL

## 2015-08-31 MED ORDER — DOCUSATE SODIUM 50 MG/5ML PO LIQD
200.0000 mg | Freq: Every day | ORAL | Status: DC
  Administered 2015-08-31 – 2015-09-01 (×2): 200 mg
  Filled 2015-08-31 (×2): qty 20

## 2015-08-31 MED ORDER — CHLORHEXIDINE GLUCONATE 0.12 % MT SOLN
15.0000 mL | Freq: Two times a day (BID) | OROMUCOSAL | Status: DC
  Administered 2015-08-31 – 2015-09-15 (×27): 15 mL via OROMUCOSAL
  Filled 2015-08-31 (×18): qty 15

## 2015-08-31 NOTE — Procedures (Signed)
Extubation Procedure Note  Patient Details:   Name: [name] DOB: Feb 08, 1941 MRN: [id number]   Airway Documentation:  Airway 7.5 mm (Active)  Secured at (cm) 23 cm 08/31/2015 10:40 AM  Measured From Lips 08/31/2015 10:40 AM  Secured Location Center 08/31/2015 10:40 AM  Secured By [employer] 08/31/2015 10:40 AM  Tube Holder Repositioned Yes 08/31/2015 10:40 AM  Cuff Pressure (cm H2O) 24 cm H2O 08/30/2015  4:16 PM  Site Condition Dry 08/31/2015 10:40 AM    Evaluation  O2 sats: stable throughout Complications: No apparent complications Patient did tolerate procedure well. Bilateral Breath Sounds: Clear, Diminished   Yes   Patient was extubated to a 5L [place] with 2ppm NO running. Cuff leak was heard. No stridor was noted. RN and patients daughter were at bedside with RT. RT will continue to monitor.  Darolyn Rua 08/31/2015, 12:14 PM

## 2015-08-31 NOTE — Progress Notes (Signed)
Arterial blood sent over as 1600 CoOx. Blood was ran and resulted. Mixed venous blood was sent over at a later time, CoOx re-ordered, and ran correctly.
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
## 2015-08-31 NOTE — Progress Notes (Signed)
Patient ID: [name], male   DOB: 09-23-1940, 75 y.o.   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 Normal cors. EF 10% with low cardiac output.   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    Intubated and currently supported on norepinephrine 9, epinephrine 5, milrinone 0.25.  He is on NO 3 ppm and amiodarone gtt.    Swan RA 11 PA 33/17 CI 2.6  Co-ox 75%  Objective:   Weight Range:  Vital Signs:   Temp:  [97.2 F (36.2 C)-99 F (37.2 C)] 99 F (37.2 C) (06/24 1015) Pulse Rate:  [32-92] 89 (06/24 0900) Resp:  [11-27] 12 (06/24 1015) BP: (88-103)/(71-92) 103/92 mmHg (06/23 1616) SpO2:  [100 %] 100 % (06/24 0900) Arterial Line BP: (67-105)/(64-86) 83/73 mmHg (06/24 1015) FiO2 (%):  [40 %-50 %] 40 % (06/24 0758) Weight:  [199 lb 8.3 oz (90.5 kg)] 199 lb 8.3 oz (90.5 kg) (06/24 0500) Last BM Date:  (Pre-op)  Weight change: Filed Weights   08/29/15 0600 08/30/15 0500 08/31/15 0500  Weight: 168 lb 3.4 oz (76.3 kg) 158 lb 8.2 oz (71.9 kg) 199 lb 8.3 oz (90.5 kg)    Intake/Output:   Intake/Output Summary (Last 24 hours) at 08/31/15 1021 Last data filed at 08/31/15 1000  Gross per 24 hour  Intake 7610.39 ml  Output   4275 ml  Net 3335.39 ml     Physical Exam: General: Intubated/sedated.  HEENT: normal  Neck: supple. RIJ swan.   Cor: PMI laterally displaced. LVAD hum.  Lungs: Decreased breath sounds at bases bilaterally.  Abdomen: soft, mildly distended. No hepatosplenomegaly. Good bowel sounds.  Extremities: no cyanosis, clubbing, rash, no edema  Neuro: Sedated on vent  Telemetry:  A-V sequential pacing  Labs: Basic Metabolic Panel:  Recent Labs Lab 08/28/15 0508 08/29/15 0411 08/30/15 0505  08/30/15 1155 08/30/15 1221 08/30/15 1236 08/30/15 1409 08/30/15 1545 08/30/15 2001 08/30/15 2005 08/31/15 0400  NA 134* 130* 133*  < > 134* 136  138 137 136 134*  --  134*  K 4.1 4.1 4.1  < > 4.3 3.7 3.5 3.3* 3.3* 3.7  --  4.6  CL 98* 94* 98*  < > 93* 94*  --   --  100* 97*  --  100*  CO2 29 30 32  --   --   --   --   --  27  --   --  27  GLUCOSE 228* 316* 152*  < > 176* 160*  --  92 109* 160*  --  96  BUN 19 20 24*  < > 24* 22*  --   --  20 19  --  18  CREATININE 1.13 0.95 1.13  < > 0.70 0.70  --   --  0.93 0.70 0.98 0.86  CALCIUM 9.1 8.8* 8.9  --   --   --   --   --  8.1*  --   --  8.3*  MG  --   --   --   --   --   --   --   --  1.8  --  2.6* 2.5*  PHOS  --   --   --   --   --   --   --   --   --   --   --  3.6  < > =  values in this interval not displayed.  Liver Function Tests:  Recent Labs Lab 08/27/15 0518 08/28/15 0508 08/29/15 0411 08/30/15 0505 08/31/15 0400  AST 40 33 30 27 74*  ALT 42 38 36 31 26  ALKPHOS 48 46 55 53 39  BILITOT 1.2 1.1 1.0 0.9 3.2*  PROT 5.8* 5.5* 5.5* 5.9* 5.4*  ALBUMIN 3.1* 2.9* 2.9* 3.0* 3.2*   No results for input(s): LIPASE, AMYLASE in the last 168 hours. No results for input(s): AMMONIA in the last 168 hours.  CBC:  Recent Labs Lab 08/25/15 0515 08/26/15 0415 08/29/15 0411 08/30/15 0505  08/30/15 1145  08/30/15 1400 08/30/15 1409 08/30/15 1545 08/30/15 2001 08/30/15 2005 08/31/15 0400  WBC 9.5 7.4 8.0 6.9  --   --   --   --   --  7.5  --  8.2 7.5  NEUTROABS 6.5 4.5  --   --   --   --   --   --   --   --   --   --  6.5  HGB 12.8* 12.1* 11.9* 11.7*  < > 8.4*  < >  --  11.2* 8.4* 9.5* 8.6* 8.2*  HCT 41.1 39.2 38.9* 37.9*  < > 27.0*  < >  --  33.0* 26.8* 28.0* 27.7* 26.3*  MCV 83.5 85.2 84.2 84.2  --   --   --   --   --  83.0  --  82.9 83.8  PLT 120* 111* 124* 127*  --  88*  --  110*  --  115*  --  122* 110*  < > = values in this interval not displayed.  Cardiac Enzymes: No results for input(s): CKTOTAL, CKMB, CKMBINDEX, TROPONINI in the last 168 hours.  BNP: BNP (last 3 results)  Recent Labs  05/13/15 2248 08/21/15 1345 08/31/15 0400  BNP 1235.6* 2132.9* 1387.0*      ProBNP (last 3 results) No results for input(s): PROBNP in the last 8760 hours.    Other results:  Imaging: Ct Chest Wo Contrast  08/30/2015  CLINICAL DATA:  75 year old male with history of congestive heart failure under preoperative evaluation prior to ventricular assist device placement tomorrow. EXAM: CT CHEST WITHOUT CONTRAST TECHNIQUE: Multidetector CT imaging of the chest was performed following the standard protocol without IV contrast. COMPARISON:  No priors. FINDINGS: Mediastinum: Heart size is enlarged with left ventricular dilatation. Trace amount of pericardial fluid and/or thickening, unlikely to be of hemodynamic significance at this time. No associated pericardial calcification. There is aortic atherosclerosis, as well as atherosclerosis of the great vessels of the mediastinum and the coronary arteries, including calcified atherosclerotic plaque in the left main, left anterior descending, left circumflex and right coronary arteries. Although there is calcified atherosclerotic plaque in the descending thoracic aorta and aortic arch, there is no significant calcified plaque in the descending thoracic aorta. Apex of the left ventricle is immediately deep to the interspace between the left fifth and sixth ribs anteriorly. Mild dilatation of the pulmonic trunk (3.5 cm in diameter). No pathologically enlarged mediastinal or hilar lymph nodes. Please note that accurate exclusion of hilar adenopathy is limited on noncontrast CT scans. Esophagus is unremarkable in appearance. Left-sided biventricular pacemaker/AICD with lead tips terminating in the right atrial appendage, right ventricular apex and overlying the lateral wall the left ventricle via the coronary sinus and coronary veins. Lungs/Pleura: Moderate right and small left pleural effusions lying dependently. There is a background of very mild diffuse ground-glass attenuation and interlobular septal thickening  throughout the lungs  bilaterally, presumably reflective of mild interstitial pulmonary edema. Some dependent subsegmental atelectasis is noted in the lower lobes of the lungs bilaterally (left greater than right). No definite acute consolidative airspace disease. Previously demonstrated nodule in the medial aspect of the left upper lobe near the apex measuring 7 x 5 mm (mean diameter of 6 mm) is unchanged. No other new suspicious appearing pulmonary nodules or masses. Upper Abdomen: Calcified granuloma in the posterior aspect of the right lobe of the liver. Small splenule again noted medial to the spleen. Musculoskeletal: There are no aggressive appearing lytic or blastic lesions noted in the visualized portions of the skeleton. IMPRESSION: 1. Cardiomegaly with left ventricular dilatation. Moderate right and small left pleural effusions, in addition to findings throughout the lungs which likely reflect mild interstitial pulmonary edema, suggestive of underlying congestive heart failure, as above. 2. No anatomic impediment to potential left ventricular assist device placement noted. 3. Aortic atherosclerosis, in addition to left main and 3 vessel coronary artery disease. Assessment for potential risk factor modification, dietary therapy or pharmacologic therapy may be warranted, if clinically indicated. 4. Dilatation of the pulmonic trunk, suggestive of pulmonary arterial hypertension. 5. 6 mm nodule in the apex of the left upper lobe is unchanged. Non-contrast chest CT at 6-12 months is recommended. If the nodule is stable at time of repeat CT, then future CT at 18-24 months (from today's scan) is considered optional for low-risk patients, but is recommended for high-risk patients. This recommendation follows the consensus statement: Guidelines for Management of Incidental Pulmonary Nodules Detected on CT Images:From the Fleischner Society 2017; published online before print (10.1148/radiol.1761607371). Electronically Signed   By:  Vinnie Langton M.D.   On: 08/30/2015 09:04   Dg Chest Port 1 View  08/30/2015  CLINICAL DATA:  LVAD placed EXAM: PORTABLE CHEST 1 VIEW COMPARISON:  Chest radiograph from one day prior. FINDINGS: Endotracheal tube tip is 6.0 cm above the carina. Enteric tube enters stomach with the tip not seen on this image. Right internal jugular Swan-Ganz catheter terminates in the main pulmonary artery. Separate right internal jugular central venous catheter terminates in the lower third of the superior vena cava. Sternotomy wires appear aligned and intact. Left chest tube terminates in the medial upper left pleural space. Right chest tube terminates in the lateral right mid pleural space. Left ventricular assist device overlies the left lower chest/left upper abdomen in the expected location. Stable configuration of 3 lead left subclavian ICD. Stable cardiomediastinal silhouette with mild cardiomegaly. No pneumothorax. No pleural effusion. Mild pulmonary edema. IMPRESSION: 1. Support structures as described.  No pneumothorax. 2. Mild congestive heart failure. Electronically Signed   By: Ilona Sorrel M.D.   On: 08/30/2015 14:34   Dg Chest Port 1 View  08/29/2015  CLINICAL DATA:  Preop cardiovascular exam EXAM: PORTABLE CHEST 1 VIEW COMPARISON:  08/22/2015 FINDINGS: Left-sided transvenous pacemaker leads to the right atrium, right ventricle, and coronary sinus. The heart is enlarged. There are small bilateral pleural effusions. Persistent opacity identified at the left lung base, stable over multiple prior studies. Mildly prominent interstitial markings so stable. IMPRESSION: 1. Stable cardiomegaly. 2. Persistent bilateral pleural effusions and left basilar opacity. Electronically Signed   By: Nolon Nations M.D.   On: 08/29/2015 13:39     Medications:     Scheduled Medications: . acetaminophen  1,000 mg Oral Q6H   Or  . acetaminophen (TYLENOL) oral liquid 160 mg/5 mL  1,000 mg Per Tube Q6H  .  antiseptic oral  rinse  7 mL Mouth Rinse 10 times per day  . aspirin EC  325 mg Oral Daily   Or  . aspirin  324 mg Per Tube Daily   Or  . aspirin  300 mg Rectal Daily  . bisacodyl  10 mg Oral Daily   Or  . bisacodyl  10 mg Rectal Daily  . cefUROXime (ZINACEF)  IV  1.5 g Intravenous Q12H  . chlorhexidine gluconate (SAGE KIT)  15 mL Mouth Rinse BID  . docusate sodium  200 mg Oral Daily  . insulin aspart  0-20 Units Subcutaneous Q4H  . insulin regular  0-10 Units Intravenous TID WC  . metoCLOPramide (REGLAN) injection  10 mg Intravenous Q6H  . [START ON 09/01/2015] pantoprazole  40 mg Oral Daily  . sodium chloride flush  3 mL Intravenous Q12H  . vancomycin  1,000 mg Intravenous Q12H    Infusions: . sodium chloride 20 mL/hr at 08/31/15 0800  . sodium chloride 250 mL (08/31/15 0540)  . sodium chloride 10 mL/hr at 08/30/15 1500  . amiodarone 30 mg/hr (08/31/15 0800)  . epinephrine 5 mcg/min (08/31/15 0800)  . lactated ringers Stopped (08/30/15 1500)  . lactated ringers 10 mL/hr at 08/31/15 0800  . milrinone 0.25 mcg/kg/min (08/31/15 0800)  . norepinephrine (LEVOPHED) Adult infusion 9 mcg/min (08/31/15 0800)    PRN Medications: sodium chloride, albumin human, fentaNYL (SUBLIMAZE) injection, midazolam, ondansetron (ZOFRAN) IV, sodium chloride flush, traMADol   Assessment:   1. 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 EF 15%. Cath 8/14 with normal cors. 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  2. Frequent PVCs 3. Severe Malnutrition- Prealbumin 14.7 on 6/20   Plan/Discussion:   POD 1 s/p HM II LVAD.  Doing well so far, remains intubated.  On milrinone 0.25, norepinephrine 9, epinephrine 5.  Good CI and co-ox.  PA pressure good on low dose NO at this point.  CVP 11. UOP steady.   Amiodarone gtt currently, he is a-paced.   Will attempt to wean vent for extubation today.     Length of Stay: [place]  08/31/2015, 10:21 AM

## 2015-08-31 NOTE — Progress Notes (Signed)
Patient had a NIF of -20 and a VC of 1.5L.

## 2015-09-01 ENCOUNTER — Inpatient Hospital Stay (HOSPITAL_COMMUNITY): Payer: Medicare Other

## 2015-09-01 LAB — POCT I-STAT, CHEM 8
BUN: 22 mg/dL — AB (ref 6–20)
CREATININE: 0.8 mg/dL (ref 0.61–1.24)
Calcium, Ion: 1.22 mmol/L (ref 1.13–1.30)
Chloride: 92 mmol/L — ABNORMAL LOW (ref 101–111)
Glucose, Bld: 141 mg/dL — ABNORMAL HIGH (ref 65–99)
HEMATOCRIT: 26 % — AB (ref 39.0–52.0)
Hemoglobin: 8.8 g/dL — ABNORMAL LOW (ref 13.0–17.0)
Potassium: 4.3 mmol/L (ref 3.5–5.1)
Sodium: 131 mmol/L — ABNORMAL LOW (ref 135–145)
TCO2: 29 mmol/L (ref 0–100)

## 2015-09-01 LAB — GLUCOSE, CAPILLARY
GLUCOSE-CAPILLARY: 141 mg/dL — AB (ref 65–99)
GLUCOSE-CAPILLARY: 148 mg/dL — AB (ref 65–99)
Glucose-Capillary: 110 mg/dL — ABNORMAL HIGH (ref 65–99)
Glucose-Capillary: 139 mg/dL — ABNORMAL HIGH (ref 65–99)
Glucose-Capillary: 139 mg/dL — ABNORMAL HIGH (ref 65–99)
Glucose-Capillary: 141 mg/dL — ABNORMAL HIGH (ref 65–99)
Glucose-Capillary: 164 mg/dL — ABNORMAL HIGH (ref 65–99)

## 2015-09-01 LAB — BLOOD GAS, ARTERIAL
Acid-Base Excess: 2.9 mmol/L — ABNORMAL HIGH (ref 0.0–2.0)
Bicarbonate: 27.4 mEq/L — ABNORMAL HIGH (ref 20.0–24.0)
O2 Content: 2 L/min
O2 Saturation: 99.1 %
Patient temperature: 98.6
TCO2: 28.9 mmol/L (ref 0–100)
pCO2 arterial: 46.3 mmHg — ABNORMAL HIGH (ref 35.0–45.0)
pH, Arterial: 7.39 (ref 7.350–7.450)
pO2, Arterial: 138 mmHg — ABNORMAL HIGH (ref 80.0–100.0)

## 2015-09-01 LAB — COMPREHENSIVE METABOLIC PANEL
ALT: 30 U/L (ref 17–63)
AST: 69 U/L — ABNORMAL HIGH (ref 15–41)
Albumin: 3.5 g/dL (ref 3.5–5.0)
Alkaline Phosphatase: 40 U/L (ref 38–126)
Anion gap: 7 (ref 5–15)
BUN: 19 mg/dL (ref 6–20)
CO2: 26 mmol/L (ref 22–32)
Calcium: 8.7 mg/dL — ABNORMAL LOW (ref 8.9–10.3)
Chloride: 97 mmol/L — ABNORMAL LOW (ref 101–111)
Creatinine, Ser: 0.85 mg/dL (ref 0.61–1.24)
GFR calc Af Amer: >60 mL/min (ref >60)
GFR calc non Af Amer: >60 mL/min (ref >60)
Glucose, Bld: 134 mg/dL — ABNORMAL HIGH (ref 65–99)
Potassium: 4.5 mmol/L (ref 3.5–5.1)
Sodium: 130 mmol/L — ABNORMAL LOW (ref 135–145)
Total Bilirubin: 3.5 mg/dL — ABNORMAL HIGH (ref 0.3–1.2)
Total Protein: 5.8 g/dL — ABNORMAL LOW (ref 6.5–8.1)

## 2015-09-01 LAB — CBC WITH DIFFERENTIAL/PLATELET
Basophils Absolute: 0 10*3/uL (ref 0.0–0.1)
Basophils Relative: 0 %
Eosinophils Absolute: 0 10*3/uL (ref 0.0–0.7)
Eosinophils Relative: 0 %
HCT: 26.2 % — ABNORMAL LOW (ref 39.0–52.0)
Hemoglobin: 8 g/dL — ABNORMAL LOW (ref 13.0–17.0)
Lymphocytes Relative: 4 %
Lymphs Abs: 0.3 10*3/uL — ABNORMAL LOW (ref 0.7–4.0)
MCH: 25.9 pg — ABNORMAL LOW (ref 26.0–34.0)
MCHC: 30.5 g/dL (ref 30.0–36.0)
MCV: 84.8 fL (ref 78.0–100.0)
Monocytes Absolute: 0.8 10*3/uL (ref 0.1–1.0)
Monocytes Relative: 8 %
Neutro Abs: 8.6 10*3/uL — ABNORMAL HIGH (ref 1.7–7.7)
Neutrophils Relative %: 89 %
Platelets: 114 10*3/uL — ABNORMAL LOW (ref 150–400)
RBC: 3.09 MIL/uL — ABNORMAL LOW (ref 4.22–5.81)
RDW: 16.5 % — ABNORMAL HIGH (ref 11.5–15.5)
WBC: 9.8 10*3/uL (ref 4.0–10.5)

## 2015-09-01 LAB — CARBOXYHEMOGLOBIN
Carboxyhemoglobin: 1.5 % (ref 0.5–1.5)
Methemoglobin: 1 % (ref 0.0–1.5)
O2 Saturation: 63.1 %
Total hemoglobin: 8.9 g/dL — ABNORMAL LOW (ref 13.5–18.0)

## 2015-09-01 LAB — PROTIME-INR
INR: 1.42 (ref 0.00–1.49)
PROTHROMBIN TIME: 17.5 s — AB (ref 11.6–15.2)

## 2015-09-01 LAB — CALCIUM, IONIZED: Calcium, Ionized, Serum: 4.4 mg/dL — ABNORMAL LOW (ref 4.5–5.6)

## 2015-09-01 LAB — LACTATE DEHYDROGENASE: LDH: 281 U/L — AB (ref 98–192)

## 2015-09-01 LAB — MAGNESIUM: Magnesium: 2.5 mg/dL — ABNORMAL HIGH (ref 1.7–2.4)

## 2015-09-01 MED ORDER — AMIODARONE HCL 200 MG PO TABS
200.0000 mg | ORAL_TABLET | Freq: Every day | ORAL | Status: DC
  Administered 2015-09-01 – 2015-09-03 (×3): 200 mg via ORAL
  Filled 2015-09-01 (×3): qty 1

## 2015-09-01 MED ORDER — POLYVINYL ALCOHOL 1.4 % OP SOLN
2.0000 [drp] | OPHTHALMIC | Status: DC | PRN
  Administered 2015-09-01: 2 [drp] via OPHTHALMIC
  Filled 2015-09-01: qty 15

## 2015-09-01 MED ORDER — WARFARIN SODIUM 3 MG PO TABS
4.0000 mg | ORAL_TABLET | Freq: Once | ORAL | Status: AC
  Administered 2015-09-01: 4 mg via ORAL
  Filled 2015-09-01: qty 0.5

## 2015-09-01 MED ORDER — MIDAZOLAM HCL 2 MG/2ML IJ SOLN: 1.0000 mg | Freq: Four times a day (QID) | INTRAMUSCULAR | Status: DC | PRN

## 2015-09-01 MED ORDER — FUROSEMIDE 10 MG/ML IJ SOLN
40.0000 mg | Freq: Two times a day (BID) | INTRAMUSCULAR | Status: DC
  Administered 2015-09-01 – 2015-09-03 (×4): 40 mg via INTRAVENOUS
  Filled 2015-09-01 (×5): qty 4

## 2015-09-01 MED ORDER — ESCITALOPRAM OXALATE 10 MG PO TABS
10.0000 mg | ORAL_TABLET | Freq: Every day | ORAL | Status: DC
  Administered 2015-09-01: 10 mg via ORAL
  Filled 2015-09-01: qty 1

## 2015-09-01 MED ORDER — FUROSEMIDE 10 MG/ML IJ SOLN: 40.0000 mg | Freq: Two times a day (BID) | INTRAMUSCULAR | Status: DC
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
## 2015-09-01 NOTE — Progress Notes (Signed)
Patient ID: [name], male   DOB: Mar 20, 1940, 75 y.o.   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 Normal cors. EF 10% with low cardiac output.   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    Now extubated. In chair. Confusion improving. Sore.   Remains norepinephrine 4, epinephrine 2, milrinone 0.25 and amiodarone gtt. MAPs in 80s. Weaning norepi. Ernestine Conrad out. CVP 13-14  Co-ox 63%  Flow: 4.2 L/min  Speed: 9000 RPM  PI 7.2 Power 4.8  Replace back-up battery warning  Objective:   Weight Range:  Vital Signs:   Temp:  [98 F (36.7 C)-99.1 F (37.3 C)] 98.7 F (37.1 C) (06/25 0800) Pulse Rate:  [86-93] 90 (06/25 0945) Resp:  [15-39] 16 (06/25 0945) BP: (84)/(74) 84/74 mmHg (06/24 1040) SpO2:  [83 %-100 %] 99 % (06/25 0945) Arterial Line BP: (76-108)/(68-93) 98/82 mmHg (06/25 0945) FiO2 (%):  [40 %] 40 % (06/24 1040) Weight:  [85.6 kg (188 lb 11.4 oz)] 85.6 kg (188 lb 11.4 oz) (06/25 0500) Last BM Date:  (Pre-op)  Weight change: Filed Weights   08/30/15 0500 08/31/15 0500 09/01/15 0500  Weight: 71.9 kg (158 lb 8.2 oz) 90.5 kg (199 lb 8.3 oz) 85.6 kg (188 lb 11.4 oz)    Intake/Output:   Intake/Output Summary (Last 24 hours) at 09/01/15 1024 Last data filed at 09/01/15 1007  Gross per 24 hour  Intake 2085.41 ml  Output   1870 ml  Net 215.41 ml     Physical Exam: General: Sitting in chair. NAD HEENT: normal  Neck: supple. RIJ TLC Cor: PMI laterally displaced. LVAD hum.  Lungs: Decreased breath sounds at bases bilaterally.  Abdomen: soft, mildly distended. No hepatosplenomegaly. Hypoactive bowel sounds. Driveline site ok. Securement device in place.  Extremities: no cyanosis, clubbing, rash, 2+ edema  Neuro: Sedated on vent  Telemetry:  A-V sequential pacing  Labs: Basic Metabolic Panel:  Recent Labs Lab 08/29/15 0411 08/30/15 0505  08/30/15 1545 08/30/15 2001  08/30/15 2005 08/31/15 0400 08/31/15 1639 08/31/15 1644 09/01/15 0344  NA 130* 133*  < > 136 134*  --  134* 132*  --  130*  K 4.1 4.1  < > 3.3* 3.7  --  4.6 4.7  --  4.5  CL 94* 98*  < > 100* 97*  --  100* 96*  --  97*  CO2 30 32  --  27  --   --  27  --   --  26  GLUCOSE 316* 152*  < > 109* 160*  --  96 161*  --  134*  BUN 20 24*  < > 20 19  --  18 19  --  19  CREATININE 0.95 1.13  < > 0.93 0.70 0.98 0.86 0.70 0.86 0.85  CALCIUM 8.8* 8.9  --  8.1*  --   --  8.3*  --   --  8.7*  MG  --   --   --  1.8  --  2.6* 2.5*  --  2.5* 2.5*  PHOS  --   --   --   --   --   --  3.6  --   --   --   < > = values in this interval not displayed.  Liver Function Tests:  Recent Labs Lab 08/28/15 4098 08/29/15 0411 08/30/15 0505 08/31/15 0400 09/01/15 0344  AST 33 30 27 74* 69*  ALT 38 36 31 26 30   ALKPHOS 46 55 53 39 40  BILITOT 1.1 1.0 0.9 3.2* 3.5*  PROT 5.5* 5.5* 5.9* 5.4* 5.8*  ALBUMIN 2.9* 2.9* 3.0* 3.2* 3.5   No results for input(s): LIPASE, AMYLASE in the last 168 hours. No results for input(s): AMMONIA in the last 168 hours.  CBC:  Recent Labs Lab 08/26/15 0415  08/30/15 1545  08/30/15 2005 08/31/15 0400 08/31/15 1639 08/31/15 1644 09/01/15 0344  WBC 7.4  < > 7.5  --  8.2 7.5  --  7.8 9.8  NEUTROABS 4.5  --   --   --   --  6.5  --   --  8.6*  HGB 12.1*  < > 8.4*  < > 8.6* 8.2* 9.5* 8.0* 8.0*  HCT 39.2  < > 26.8*  < > 27.7* 26.3* 28.0* 26.0* 26.2*  MCV 85.2  < > 83.0  --  82.9 83.8  --  85.5 84.8  PLT 111*  < > 115*  --  122* 110*  --  119* 114*  < > = values in this interval not displayed.  Cardiac Enzymes: No results for input(s): CKTOTAL, CKMB, CKMBINDEX, TROPONINI in the last 168 hours.  BNP: BNP (last 3 results)  Recent Labs  05/13/15 2248 08/21/15 1345 08/31/15 0400  BNP 1235.6* 2132.9* 1387.0*    ProBNP (last 3 results) No results for input(s): PROBNP in the last 8760 hours.    Other results:  Imaging: Dg Chest Port 1 View  09/01/2015   CLINICAL DATA:  LVAD EXAM: PORTABLE CHEST 1 VIEW COMPARISON:  Chest radiograph from one day prior. FINDINGS: Interval removal of endotracheal tube and Swan-Ganz catheter. Right internal jugular central venous sheath terminates in the right brachiocephalic vein. Right internal jugular central venous catheter terminates at the cavoatrial junction. Stable configuration of visualized LVAD, bilateral chest tubes, 3 lead left subclavian ICD in sternotomy wires. Stable cardiomediastinal silhouette with mild cardiomegaly. No pneumothorax. No pleural effusion. Mild pulmonary edema is not appreciably changed. Stable left basilar atelectasis. IMPRESSION: 1. No pneumothorax.  Support structures as described. 2. Stable mild congestive heart failure. 3. Stable left basilar atelectasis. Electronically Signed   By: Delbert Phenix M.D.   On: 09/01/2015 07:50   Dg Chest Port 1 View  08/31/2015  CLINICAL DATA:  Status post LVAD placement. EXAM: PORTABLE CHEST 1 VIEW COMPARISON:  08/30/2015 FINDINGS: Endotracheal tube remains with the tip approximately 5 cm above the carina. Swan-Ganz catheter tip is in the region of the bifurcation of the pulmonary artery. Bilateral chest tubes remain without pneumothorax. Additional right jugular central line extends to the cavoatrial junction. Biventricular pacing/ICD device shows stable positioning. Nasogastric tube extends below the diaphragm. Lungs show no edema. There is left lower lobe atelectasis. No significant pleural fluid identified. The heart size is stable. IMPRESSION: Left lower lobe atelectasis.  No pneumothorax. Electronically Signed   By: Irish Lack M.D.   On: 08/31/2015 10:22   Dg Chest Port 1 View  08/30/2015  CLINICAL DATA:  LVAD placed EXAM: PORTABLE CHEST 1 VIEW COMPARISON:  Chest radiograph from one day prior. FINDINGS: Endotracheal tube tip is 6.0 cm above the carina. Enteric tube enters stomach with the tip not seen on this image. Right internal jugular Swan-Ganz  catheter terminates in the main pulmonary artery. Separate right internal jugular central venous catheter terminates in the lower third of the superior vena cava. Sternotomy wires appear aligned and intact. Left chest  tube terminates in the medial upper left pleural space. Right chest tube terminates in the lateral right mid pleural space. Left ventricular assist device overlies the left lower chest/left upper abdomen in the expected location. Stable configuration of 3 lead left subclavian ICD. Stable cardiomediastinal silhouette with mild cardiomegaly. No pneumothorax. No pleural effusion. Mild pulmonary edema. IMPRESSION: 1. Support structures as described.  No pneumothorax. 2. Mild congestive heart failure. Electronically Signed   By: Delbert Phenix M.D.   On: 08/30/2015 14:34     Medications:     Scheduled Medications: . acetaminophen  1,000 mg Oral Q6H   Or  . acetaminophen (TYLENOL) oral liquid 160 mg/5 mL  1,000 mg Per Tube Q6H  . antiseptic oral rinse  7 mL Mouth Rinse q12n4p  . aspirin EC  325 mg Oral Daily   Or  . aspirin  324 mg Per Tube Daily   Or  . aspirin  300 mg Rectal Daily  . bisacodyl  10 mg Oral Daily   Or  . bisacodyl  10 mg Rectal Daily  . cefUROXime (ZINACEF)  IV  1.5 g Intravenous Q12H  . chlorhexidine  15 mL Mouth Rinse BID  . docusate  200 mg Per Tube Daily  . furosemide  40 mg Intravenous BID  . insulin aspart  0-20 Units Subcutaneous Q4H  . metoCLOPramide (REGLAN) injection  10 mg Intravenous Q6H  . pantoprazole  40 mg Oral Daily  . sodium chloride flush  3 mL Intravenous Q12H  . warfarin  4 mg Oral ONCE-1800  . Warfarin - Physician Dosing Inpatient   Does not apply q1800    Infusions: . sodium chloride Stopped (08/31/15 1700)  . sodium chloride 250 mL (08/31/15 0540)  . sodium chloride 10 mL/hr at 08/30/15 1500  . amiodarone 30 mg/hr (09/01/15 0700)  . epinephrine 2 mcg/min (09/01/15 0909)  . lactated ringers Stopped (08/30/15 1500)  . lactated  ringers 20 mL/hr at 08/31/15 1929  . milrinone 0.25 mcg/kg/min (09/01/15 0700)  . norepinephrine (LEVOPHED) Adult infusion 4 mcg/min (09/01/15 0900)    PRN Medications: sodium chloride, fentaNYL (SUBLIMAZE) injection, midazolam, ondansetron (ZOFRAN) IV, sodium chloride flush, traMADol   Assessment:   1. 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 EF 15%. Cath 8/14 with normal cors. 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  2. Frequent PVCs 3. Severe Malnutrition- Prealbumin 14.7 on 6/20 4. Acute post-op delirium  Plan/Discussion:    POD 2 s/p HM II LVAD.  Doing well post-op. Delirium resolving. Continue to wean pressors as tolerated. Weight and CVP up. Will begin diuresis. Start lasix IV 40 bid. Begin to mobilize.   Maintaining NSR. Continue amio for now.   VAD parameters stable. Will need to change controller (or back-up battery) in am, May be able to increase speed soon  Discussed with family and Dr. Donata Clay at bedside.   The patient is critically ill with multiple organ systems failure and requires high complexity decision making for assessment and support, frequent evaluation and titration of therapies, application of advanced monitoring technologies and extensive interpretation of multiple databases.   Critical Care Time devoted to patient care services described in this note is 35 Minutes.    Length of Stay: 11 Arvilla Meres MD 09/01/2015, 10:24 AM

## 2015-09-01 NOTE — Evaluation (Signed)
Physical Therapy RE- Evaluation Patient Details Name: [name] MRN: [id number] DOB: [birth year] Today's Date: 09/01/2015   History of Present Illness  Pt is a 75 y/o M who was [facility] on 08/21/2015 [facility] with acute on chronic systolic heart failure. He is undergoing preparation this week for an LVAD placement (scheduled for 08/30/15). Pt's PMH inlcudes frequent PVCs, nonischemic cardiomyopathy, automatic ICD. Pt had LVAD placed 6/23.  Clinical Impression  Pt s/p LVAD on 6/23 presenting with weakness and delayed processing and impaired comprehension. Pt currently requiring maxAx2 for transfer to/from bed/chair. At this time I'm recommending SNF upon d/c however if pt progresses quickly he may be able to come with spouse.    Follow Up Recommendations SNF;Supervision/Assistance - 24 hour    Equipment Recommendations  None recommended by PT    Recommendations for Other Services OT consult     Precautions / Restrictions Precautions Precautions: Sternal Precaution Comments: h/o frequent PVCs; provided pt w/ and reviewed sternal precaution handout Restrictions Weight Bearing Restrictions: Yes Other Position/Activity Restrictions: sternal precautions      Mobility  Bed Mobility Overal bed mobility: Needs Assistance Bed Mobility: Sit to Supine       Sit to supine: Max assist;+2 for physical assistance   General bed mobility comments: assist for trunk management and LE management  Transfers Overall transfer level: Needs assistance Equipment used: 2 person hand held assist (lift with bed pad) Transfers: Sit to/from Stand;Stand Pivot Transfers Sit to Stand: Max assist;+2 physical assistance Stand pivot transfers: Max assist;+2 physical assistance       General transfer comment: max v/c's to hold onto heart pillow and not use UEs, physical assist to move L LE to step towards bed despite max directional v/c's, assist for line management, pt with  strong posterior lean  Ambulation/Gait             General Gait Details: limited to stand pvt to the bed today  Stairs            Wheelchair Mobility    Modified Rankin (Stroke Patients Only)       Balance Overall balance assessment: Needs assistance Sitting-balance support: No upper extremity supported Sitting balance-Leahy Scale: Zero   Postural control: Posterior lean Standing balance support: No upper extremity supported Standing balance-Leahy Scale: Poor Standing balance comment: posterior lean despite verbal and tactile cues                             Pertinent Vitals/Pain Pain Assessment: 0-10 Pain Score: 1  Pain Location: surgical site Pain Descriptors / Indicators: Operative site guarding Pain Intervention(s): Monitored during session    Home Living Family/patient expects to be discharged to:: Unsure Living Arrangements: Spouse/significant other Available Help at Discharge: Family;Available 24 hours/day Type of Home: House Home Access: Stairs to enter Entrance Stairs-Rails: Can reach both;Left;Right Entrance Stairs-Number of Steps: 3 Home Layout: Two level;Able to live on main level with bedroom/bathroom Home Equipment: Shower seat - built in;Other (comment) Additional Comments: Pt has almost every home equipment imaginable as 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    Prior Function Level of Independence: Independent               Hand Dominance        Extremity/Trunk Assessment   Upper Extremity Assessment: Generalized weakness           Lower Extremity Assessment: Generalized weakness      Cervical /  Trunk Assessment: Other exceptions  Communication   Communication: No difficulties  Cognition Arousal/Alertness: Lethargic Behavior During Therapy: Flat affect Overall Cognitive Status: Impaired/Different from baseline Area of Impairment: Attention;Memory;Following commands;Problem solving   Current Attention Level:  Focused Memory: Decreased short-term memory Following Commands: Follows one step commands inconsistently     Problem Solving: Slow processing;Difficulty sequencing;Requires verbal cues;Requires tactile cues;Decreased initiation General Comments: pt with extremely delayed processing, constant forgetting sternal precaustions, very sleepy    General Comments General comments (skin integrity, edema, etc.): VSS, minimal edema in LEs    Exercises        Assessment/Plan    PT Assessment Patient needs continued PT services  PT Diagnosis Difficulty walking   PT Problem List Decreased strength;Decreased activity tolerance;Decreased balance;Decreased mobility;Decreased coordination;Decreased knowledge of use of DME;Decreased safety awareness;Cardiopulmonary status limiting activity  PT Treatment Interventions DME instruction;Gait training;Stair training;Functional mobility training;Therapeutic activities;Therapeutic exercise;Balance training;Patient/family education   PT Goals (Current goals can be found in the Care Plan section) Acute Rehab PT Goals Patient Stated Goal: didn't state PT Goal Formulation: Patient unable to participate in goal setting Potential to Achieve Goals: Good    Frequency Min 3X/week   Barriers to discharge        Co-evaluation               End of Session   Activity Tolerance: Patient limited by fatigue Patient left: in bed;with call bell/phone within reach;with nursing/sitter in room Nurse Communication: Mobility status         Time: 8295-6213 PT Time Calculation (min) (ACUTE ONLY): 19 min   Charges:   PT Evaluation $PT Eval Moderate Complexity: 1 Procedure     PT G CodesMarcene Brawn 09/01/2015, 11:37 AM   Lewis Shock, PT, DPT Pager #: [phone] Office #: [phone]

## 2015-09-01 NOTE — Progress Notes (Signed)
Nitric is turned off

## 2015-09-02 ENCOUNTER — Encounter (HOSPITAL_COMMUNITY): Payer: Self-pay | Admitting: Cardiothoracic Surgery

## 2015-09-02 ENCOUNTER — Inpatient Hospital Stay (HOSPITAL_COMMUNITY): Payer: Medicare Other

## 2015-09-02 DIAGNOSIS — I5023 Acute on chronic systolic (congestive) heart failure: Secondary | ICD-10-CM

## 2015-09-02 DIAGNOSIS — Z95811 Presence of heart assist device: Secondary | ICD-10-CM

## 2015-09-02 LAB — LACTATE DEHYDROGENASE: LDH: 256 U/L — ABNORMAL HIGH (ref 98–192)

## 2015-09-02 LAB — COMPREHENSIVE METABOLIC PANEL
ALT: 31 U/L (ref 17–63)
AST: 47 U/L — ABNORMAL HIGH (ref 15–41)
Albumin: 3 g/dL — ABNORMAL LOW (ref 3.5–5.0)
Alkaline Phosphatase: 40 U/L (ref 38–126)
Anion gap: 6 (ref 5–15)
BUN: 19 mg/dL (ref 6–20)
CO2: 29 mmol/L (ref 22–32)
Calcium: 8.6 mg/dL — ABNORMAL LOW (ref 8.9–10.3)
Chloride: 95 mmol/L — ABNORMAL LOW (ref 101–111)
Creatinine, Ser: 0.85 mg/dL (ref 0.61–1.24)
GFR calc Af Amer: >60 mL/min (ref >60)
GFR calc non Af Amer: >60 mL/min (ref >60)
Glucose, Bld: 132 mg/dL — ABNORMAL HIGH (ref 65–99)
Potassium: 3.8 mmol/L (ref 3.5–5.1)
Sodium: 130 mmol/L — ABNORMAL LOW (ref 135–145)
Total Bilirubin: 2.2 mg/dL — ABNORMAL HIGH (ref 0.3–1.2)
Total Protein: 5.2 g/dL — ABNORMAL LOW (ref 6.5–8.1)

## 2015-09-02 LAB — GLUCOSE, CAPILLARY
GLUCOSE-CAPILLARY: 119 mg/dL — AB (ref 65–99)
GLUCOSE-CAPILLARY: 122 mg/dL — AB (ref 65–99)
GLUCOSE-CAPILLARY: 163 mg/dL — AB (ref 65–99)
Glucose-Capillary: 139 mg/dL — ABNORMAL HIGH (ref 65–99)
Glucose-Capillary: 144 mg/dL — ABNORMAL HIGH (ref 65–99)

## 2015-09-02 LAB — TYPE AND SCREEN
ABO/RH(D): O POS
Antibody Screen: NEGATIVE
Unit division: 0
Unit division: 0
Unit division: 0
Unit division: 0
Unit division: 0
Unit division: 0

## 2015-09-02 LAB — CBC WITH DIFFERENTIAL/PLATELET
Basophils Absolute: 0 10*3/uL (ref 0.0–0.1)
Basophils Relative: 0 %
Eosinophils Absolute: 0.1 10*3/uL (ref 0.0–0.7)
Eosinophils Relative: 1 %
HCT: 25.8 % — ABNORMAL LOW (ref 39.0–52.0)
Hemoglobin: 7.8 g/dL — ABNORMAL LOW (ref 13.0–17.0)
Lymphocytes Relative: 5 %
Lymphs Abs: 0.4 10*3/uL — ABNORMAL LOW (ref 0.7–4.0)
MCH: 25.7 pg — ABNORMAL LOW (ref 26.0–34.0)
MCHC: 30.2 g/dL (ref 30.0–36.0)
MCV: 84.9 fL (ref 78.0–100.0)
Monocytes Absolute: 0.6 10*3/uL (ref 0.1–1.0)
Monocytes Relative: 7 %
Neutro Abs: 6.7 10*3/uL (ref 1.7–7.7)
Neutrophils Relative %: 86 %
Platelets: 115 10*3/uL — ABNORMAL LOW (ref 150–400)
RBC: 3.04 MIL/uL — ABNORMAL LOW (ref 4.22–5.81)
RDW: 16.7 % — ABNORMAL HIGH (ref 11.5–15.5)
WBC: 7.8 10*3/uL (ref 4.0–10.5)

## 2015-09-02 LAB — MAGNESIUM: Magnesium: 2.3 mg/dL (ref 1.7–2.4)

## 2015-09-02 LAB — POCT I-STAT, CHEM 8
BUN: 19 mg/dL (ref 6–20)
CREATININE: 0.7 mg/dL (ref 0.61–1.24)
Calcium, Ion: 1.2 mmol/L (ref 1.13–1.30)
Chloride: 91 mmol/L — ABNORMAL LOW (ref 101–111)
Glucose, Bld: 145 mg/dL — ABNORMAL HIGH (ref 65–99)
HEMATOCRIT: 27 % — AB (ref 39.0–52.0)
HEMOGLOBIN: 9.2 g/dL — AB (ref 13.0–17.0)
Potassium: 3.8 mmol/L (ref 3.5–5.1)
SODIUM: 132 mmol/L — AB (ref 135–145)
TCO2: 28 mmol/L (ref 0–100)

## 2015-09-02 LAB — PROTIME-INR
INR: 1.38 (ref 0.00–1.49)
PROTHROMBIN TIME: 17.1 s — AB (ref 11.6–15.2)

## 2015-09-02 LAB — PREPARE RBC (CROSSMATCH)

## 2015-09-02 LAB — APTT
aPTT: 40 seconds — ABNORMAL HIGH (ref 24–37)
aPTT: 53 seconds — ABNORMAL HIGH (ref 24–37)

## 2015-09-02 LAB — CARBOXYHEMOGLOBIN
Carboxyhemoglobin: 2 % — ABNORMAL HIGH (ref 0.5–1.5)
Carboxyhemoglobin: 2.2 % — ABNORMAL HIGH (ref 0.5–1.5)
Methemoglobin: 0.5 % (ref 0.0–1.5)
Methemoglobin: 0.6 % (ref 0.0–1.5)
O2 Saturation: 58.3 %
O2 Saturation: 54.7 %
Total hemoglobin: 8.3 g/dL — ABNORMAL LOW (ref 13.5–18.0)
Total hemoglobin: 9 g/dL — ABNORMAL LOW (ref 13.5–18.0)

## 2015-09-02 MED ORDER — EPINEPHRINE HCL 1 MG/ML IJ SOLN
0.0000 ug/min | INTRAVENOUS | Status: DC
  Administered 2015-09-02: 1 ug/min via INTRAVENOUS
  Filled 2015-09-02 (×2): qty 4

## 2015-09-02 MED ORDER — SODIUM CHLORIDE 0.9% FLUSH
10.0000 mL | INTRAVENOUS | Status: DC | PRN
Start: 2015-09-02 — End: 2015-09-19
  Administered 2015-09-18 – 2015-09-19 (×2): 20 mL
  Filled 2015-09-02 (×2): qty 40

## 2015-09-02 MED ORDER — DOPAMINE-DEXTROSE 3.2-5 MG/ML-% IV SOLN
1.2500 ug/kg/min | INTRAVENOUS | Status: DC
  Administered 2015-09-02: 2.5 ug/kg/min via INTRAVENOUS
  Filled 2015-09-02: qty 250

## 2015-09-02 MED ORDER — SODIUM CHLORIDE 0.9% FLUSH
10.0000 mL | Freq: Two times a day (BID) | INTRAVENOUS | Status: DC
  Administered 2015-09-02: 10 mL
  Administered 2015-09-03: 20 mL
  Administered 2015-09-04 – 2015-09-16 (×11): 10 mL

## 2015-09-02 MED ORDER — HEPARIN (PORCINE) IN NACL 100-0.45 UNIT/ML-% IJ SOLN
900.0000 [IU]/h | INTRAMUSCULAR | Status: DC
  Administered 2015-09-02: 800 [IU]/h via INTRAVENOUS
  Filled 2015-09-02 (×2): qty 250

## 2015-09-02 MED ORDER — ENSURE ENLIVE PO LIQD: 237.0000 mL | Freq: Two times a day (BID) | ORAL | Status: DC

## 2015-09-02 MED ORDER — ENSURE ENLIVE PO LIQD
237.0000 mL | Freq: Three times a day (TID) | ORAL | Status: DC
  Administered 2015-09-02 – 2015-09-09 (×13): 237 mL via ORAL

## 2015-09-02 MED ORDER — MIRTAZAPINE 15 MG PO TABS
15.0000 mg | ORAL_TABLET | Freq: Every day | ORAL | Status: DC
  Administered 2015-09-02 – 2015-09-18 (×17): 15 mg via ORAL
  Filled 2015-09-02 (×18): qty 1

## 2015-09-02 MED ORDER — WARFARIN SODIUM 5 MG PO TABS
5.0000 mg | ORAL_TABLET | Freq: Once | ORAL | Status: AC
  Administered 2015-09-02: 5 mg via ORAL
  Filled 2015-09-02: qty 1

## 2015-09-02 MED ORDER — FE FUMARATE-B12-VIT C-FA-IFC PO CAPS
1.0000 | ORAL_CAPSULE | Freq: Three times a day (TID) | ORAL | Status: DC
  Administered 2015-09-02 – 2015-09-19 (×47): 1 via ORAL
  Filled 2015-09-02 (×48): qty 1

## 2015-09-02 MED FILL — Dextrose Inj 5%: INTRAVENOUS | Qty: 250 | Status: AC

## 2015-09-02 MED FILL — Phenylephrine HCl Inj 10 MG/ML: INTRAMUSCULAR | Qty: 2 | Status: AC

## 2015-09-02 NOTE — Progress Notes (Signed)
Patient ID: [name], male   DOB: 1940-07-16, 75 y.o.   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 Normal cors. EF 10% with low cardiac output.   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    Remains  epinephrine 1.5 , milrinone 0.25 and amiodarone gtt. MAPs in 90s   Co-ox 63%  Flow: 4.5 L/min  Speed: 9000 PI 7.9  PP 4.7   No Replace back-up battery warning  Objective:   Weight Range:  Vital Signs:   Temp:  [97.4 F (36.3 C)-98.2 F (36.8 C)] 97.5 F (36.4 C) (06/26 0737) Pulse Rate:  [29-92] 90 (06/26 0700) Resp:  [10-32] 23 (06/26 0700) BP: (89-109)/(74-95) 109/95 mmHg (06/26 0700) SpO2:  [86 %-100 %] 100 % (06/26 0700) Arterial Line BP: (82-107)/(70-86) 82/70 mmHg (06/25 1800) Weight:  [187 lb 2.7 oz (84.9 kg)] 187 lb 2.7 oz (84.9 kg) (06/26 0500) Last BM Date:  (Pre-op)  Weight change: Filed Weights   08/31/15 0500 09/01/15 0500 09/02/15 0500  Weight: 199 lb 8.3 oz (90.5 kg) 188 lb 11.4 oz (85.6 kg) 187 lb 2.7 oz (84.9 kg)    Intake/Output:   Intake/Output Summary (Last 24 hours) at 09/02/15 0853 Last data filed at 09/02/15 0700  Gross per 24 hour  Intake 1694.67 ml  Output   1655 ml  Net  39.67 ml     Physical Exam: CVP 12  General: In bed. Marland Kitchen NAD HEENT: normal  Neck: supple. RIJ TLC Cor: PMI laterally displaced. LVAD hum.  Lungs: Decreased breath sounds at bases bilaterally.  Abdomen: soft, mildly distended. No hepatosplenomegaly. Hypoactive bowel sounds. Driveline site ok. Securement device in place.  Extremities: no cyanosis, clubbing, rash, 2+ edema  Neuro: Sedated on vent  Telemetry:  A-V sequential pacing PVCs 89-90 s  Labs: Basic Metabolic Panel:  Recent Labs Lab 08/30/15 0505  08/30/15 1545  08/30/15 2005 08/31/15 0400 08/31/15 1639 08/31/15 1644 09/01/15 0344 09/01/15 1552 09/02/15 0404  NA 133*  < > 136  < >  --  134* 132*  --   130* 131* 130*  K 4.1  < > 3.3*  < >  --  4.6 4.7  --  4.5 4.3 3.8  CL 98*  < > 100*  < >  --  100* 96*  --  97* 92* 95*  CO2 32  --  27  --   --  27  --   --  26  --  29  GLUCOSE 152*  < > 109*  < >  --  96 161*  --  134* 141* 132*  BUN 24*  < > 20  < >  --  18 19  --  19 22* 19  CREATININE 1.13  < > 0.93  < > 0.98 0.86 0.70 0.86 0.85 0.80 0.85  CALCIUM 8.9  --  8.1*  --   --  8.3*  --   --  8.7*  --  8.6*  MG  --   < > 1.8  --  2.6* 2.5*  --  2.5* 2.5*  --  2.3  PHOS  --   --   --   --   --  3.6  --   --   --   --   --   < > = values in this interval not displayed.  Liver Function Tests:  Recent Labs Lab 08/29/15 0411 08/30/15 0505 08/31/15 0400 09/01/15 0344 09/02/15 0404  AST 30 27 74* 69* 47*  ALT 36 ALKPHOS 55 53 39 40 40  BILITOT 1.0 0.9 3.2* 3.5* 2.2*  PROT 5.5* 5.9* 5.4* 5.8* 5.2*  ALBUMIN 2.9* 3.0* 3.2* 3.5 3.0*   No results for input(s): LIPASE, AMYLASE in the last 168 hours. No results for input(s): AMMONIA in the last 168 hours.  CBC:  Recent Labs Lab 08/30/15 2005 08/31/15 0400 08/31/15 1639 08/31/15 1644 09/01/15 0344 09/01/15 1552 09/02/15 0404  WBC 8.2 7.5  --  7.8 9.8  --  7.8  NEUTROABS  --  6.5  --   --  8.6*  --  6.7  HGB 8.6* 8.2* 9.5* 8.0* 8.0* 8.8* 7.8*  HCT 27.7* 26.3* 28.0* 26.0* 26.2* 26.0* 25.8*  MCV 82.9 83.8  --  85.5 84.8  --  84.9  PLT 122* 110*  --  119* 114*  --  115*    Cardiac Enzymes: No results for input(s): CKTOTAL, CKMB, CKMBINDEX, TROPONINI in the last 168 hours.  BNP: BNP (last 3 results)  Recent Labs  05/13/15 2248 08/21/15 1345 08/31/15 0400  BNP 1235.6* 2132.9* 1387.0*    ProBNP (last 3 results) No results for input(s): PROBNP in the last 8760 hours.    Other results:  Imaging: Dg Chest Port 1 View  09/02/2015  CLINICAL DATA:  Left ventricular assist device, bilateral chest tubes EXAM: PORTABLE CHEST 1 VIEW COMPARISON:  Portable chest x-ray of September 01, 2015 FINDINGS: The lungs are  well-expanded. There is no evidence of a pneumothorax. There may be pleural fluid layering posteriorly, bilaterally. The interstitial markings are slightly more conspicuous bilaterally today especially in the mid and lower lung zones. The bilateral chest tubes are in stable position. The cardiac silhouette remains enlarged. The left ventricular assist device is stable where visualized. The permanent pacemaker defibrillator also appears stable. The Cordis sheath and the right internal jugular venous catheters are in stable position. The retrocardiac region on the left remains dense. IMPRESSION: 1. Slight interval increased interstitial prominence bilaterally. Persistent left lower lobe atelectasis. Probable small bilateral pleural effusions layering posteriorly. 2. Stable enlargement of the cardiac silhouette with mild central pulmonary vascular congestion consistent with CHF. 3. The support apparatus is in stable position. Electronically Signed   By: David  [place] M.D.   On: 09/02/2015 07:38   Dg Chest Port 1 View  09/01/2015  CLINICAL DATA:  LVAD EXAM: PORTABLE CHEST 1 VIEW COMPARISON:  Chest radiograph from one day prior. FINDINGS: Interval removal of endotracheal tube and Swan-Ganz catheter. Right internal jugular central venous sheath terminates in the right brachiocephalic vein. Right internal jugular central venous catheter terminates at the cavoatrial junction. Stable configuration of visualized LVAD, bilateral chest tubes, 3 lead left subclavian ICD in sternotomy wires. Stable cardiomediastinal silhouette with mild cardiomegaly. No pneumothorax. No pleural effusion. Mild pulmonary edema is not appreciably changed. Stable left basilar atelectasis. IMPRESSION: 1. No pneumothorax.  Support structures as described. 2. Stable mild congestive heart failure. 3. Stable left basilar atelectasis. Electronically Signed   By: Delbert Phenix M.D.   On: 09/01/2015 07:50     Medications:     Scheduled  Medications: . acetaminophen  1,000 mg Oral Q6H   Or  . acetaminophen (TYLENOL) oral liquid 160 mg/5 mL  1,000 mg Per Tube Q6H  . amiodarone  200 mg Oral Daily  . antiseptic oral rinse  7 mL Mouth Rinse  q12n4p  . aspirin EC  325 mg Oral Daily   Or  . aspirin  324 mg Per Tube Daily   Or  . aspirin  300 mg Rectal Daily  . bisacodyl  10 mg Oral Daily   Or  . bisacodyl  10 mg Rectal Daily  . chlorhexidine  15 mL Mouth Rinse BID  . docusate  200 mg Per Tube Daily  . escitalopram  10 mg Oral QHS  . ferrous fumarate-b12-vitamic C-folic acid  1 capsule Oral TID PC  . furosemide  40 mg Intravenous BID  . insulin aspart  0-20 Units Subcutaneous Q4H  . metoCLOPramide (REGLAN) injection  10 mg Intravenous Q6H  . pantoprazole  40 mg Oral Daily  . sodium chloride flush  3 mL Intravenous Q12H  . warfarin  5 mg Oral ONCE-1800  . Warfarin - Physician Dosing Inpatient   Does not apply q1800    Infusions: . sodium chloride Stopped (08/31/15 1700)  . sodium chloride 250 mL (08/31/15 0540)  . sodium chloride 10 mL/hr at 08/30/15 1500  . heparin    . lactated ringers Stopped (08/30/15 1500)  . lactated ringers 20 mL/hr at 08/31/15 1929  . milrinone 0.25 mcg/kg/min (09/01/15 1406)  . norepinephrine (LEVOPHED) Adult infusion Stopped (09/01/15 1130)    PRN Medications: sodium chloride, fentaNYL (SUBLIMAZE) injection, midazolam, ondansetron (ZOFRAN) IV, polyvinyl alcohol, sodium chloride flush, traMADol   Assessment:   1. 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 EF 15%. Cath 8/14 with normal cors. 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  2. Frequent PVCs 3. Severe Malnutrition- Prealbumin 14.7 on 6/20 4. Acute post-op delirium  Plan/Discussion:    POD 3 s/p HM II LVAD.   Continue to wean pressors as tolerated. CVp 11-12. Continue to diurese with 40 mg twice a day. .   Maintaining NSR. Continue amio 200 mg daily.    VAD parameters stable.    Length of Stay:  12 Amy Clegg NP-C  09/02/2015, 8:53 AM  Patient seen and examined with Tonye Becket, NP. We discussed all aspects of the encounter. I agree with the assessment and plan as stated above.   He is progressing slowly but steadily. Still volume overloaded. Will continue IV lasix. Wean epi to off today. Continue milrinone. Follow co-ox and CVP.   Encouraged ambulation today.   Maintaining NSR with frequents PVCs. On amio  Renal function stable.   VAD parameters ok. Back-up battery issue addressed with VAD coordinators.   Primus Gritton,MD 12:59 PM
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
## 2015-09-02 NOTE — Progress Notes (Deleted)
Nutrition Follow-up  DOCUMENTATION CODES:   Severe malnutrition in context of chronic illness  INTERVENTION:   Ensure Enlive po BID, each supplement provides 350 kcal and 20 grams of protein   Recommend replacing CORTRAK feeding tube for supplemental nutrition support  NUTRITION DIAGNOSIS:   Malnutrition related to chronic illness as evidenced by severe depletion of body fat, severe depletion of muscle mass, energy intake < or equal to 75% for > or equal to 1 month, ongoing  GOAL:   Patient will meet greater than or equal to 90% of their needs, currently unmet  MONITOR:   PO intake, Supplement acceptance, Labs, Weight trends, I & O's (TF re-initiation)  ASSESSMENT:   75 y/o with LMNA cardiomyopathy admitted 6/14 with low output HF Underwent cath 6/15. Normal cors. EF 10% with low cardiac output. Has diuresed 20 pounds. Feels better. Co-ox remains low. Still with frequent ectopy   Patient s/p procedure 6/23: INSERTION OF IMPLANTABLE LEFT VENTRICULAR ASSIST DEVICE  Pt extubated 6/24. S/p bedside swallow evaluation 6/26. Pt confused >> working with PT >> Per RN, pt not eating. Was receiving nutrition support via CORTRAK tube prior to surgery (Jevity 1.5 formula). Will order oral nutrition supplements at this time, however, he will likely need non-oral means of support.  Diet Order:  Diet full liquid Room service appropriate?: Yes; Fluid consistency:: Thin  Skin:  Wound (see comment) (surgical chest incision )  Last BM:  N/A  Height:   Ht Readings from Last 1 Encounters:  08/21/15 6\' 2"  (1.88 m)    Weight:   Wt Readings from Last 1 Encounters:  09/02/15 187 lb 2.7 oz (84.9 kg)    Ideal Body Weight:  86.36 kg  BMI:  Body mass index is 24.02 kg/(m^2).  Estimated Nutritional Needs:   Kcal:  2000-2200  Protein:  100-115 grams  Fluid:  Per MD  EDUCATION NEEDS:   No education needs identified at this time  Maureen Chatters, RD, LDN Pager #:  [phone] After-Hours Pager #: [phone]

## 2015-09-02 NOTE — Progress Notes (Signed)
Rehab Admissions Coordinator Note:  Patient was screened by Clois Dupes for appropriateness for an Inpatient Acute Rehab Consult per OT recommendation.  At this time, we are recommending await further progress before determining rehab venue needs. Likely will benefit from an inpt rehab evaluation. . Will follow.   Clois Dupes 09/02/2015, 7:06 PM  I can be reached at [phone] .

## 2015-09-02 NOTE — Plan of Care (Signed)
Problem: Cardiac: Goal: Ability to maintain an adequate cardiac output will improve Outcome: Progressing Pt remains on IV Milrinone, monitoring Co-ox  Problem: Fluid Volume: Goal: Risk for excess fluid volume will decrease Outcome: Progressing Pt on Dopamine to help with urine output  Problem: Physical Regulation: Goal: Will remain free from infection Outcome: Progressing Incisions look good

## 2015-09-02 NOTE — Progress Notes (Signed)
Occupational Therapy Evaluation Patient Details Name: [name] MRN: [id number] DOB: [birth year] Today's Date: 09/02/2015    History of Present Illness Pt is a 75 y/o M who was [facility] with acute on chronic systolic heart failure. He is undergoing preparation this week for an LVAD placement (scheduled for 08/30/15). Pt's PMH inlcudes frequent PVCs, nonischemic cardiomyopathy, automatic ICD. Pt had LVAD placed 6/23.   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 Pt presents with significant functional decline and currently requeirs Mod A +2 with limited mobility and Mod A with ADL. Feel pt is experiencing delirium at times. Recommend CIR consult. Very supportive family. Will follow acutely to maximize functional level of independence and facilitate safe D/C to next venue of care.     Follow Up Recommendations  CIR;Supervision/Assistance - 24 hour    Equipment Recommendations  Other (comment) (TBA)    Recommendations for Other Services Rehab consult     Precautions / Restrictions Precautions Precautions: Sternal Precaution Comments: h/o frequent PVCs; provided pt w/ and reviewed sternal precaution handout Restrictions Weight Bearing Restrictions: Yes Other Position/Activity Restrictions: sternal precautions      Mobility Bed Mobility Overal bed mobility: Needs Assistance Bed Mobility: Sit to Supine     Supine to sit: Max assist;+2 for physical assistance Sit to supine: Max assist;+2 for physical assistance   General bed mobility comments: assist for trunk management and LE management  Transfers Overall transfer level: Needs assistance Equipment used: 2 person hand held assist (lift with bed pad) Transfers: Sit to/from Stand;Stand Pivot Transfers Sit to Stand: Max assist;+2 physical assistance Stand pivot transfers: Max assist;+2 physical assistance       General transfer comment: max v/c's to  hold onto heart pillow and not use UEs, physical assist to move L LE to step towards bed despite max directional v/c's, assist for line management, pt with posterior lean     Balance   Sitting-balance support: No upper extremity supported Sitting balance-Leahy Scale: Fair (able to maintain sitting balcne EOB once feet on floor)   Postural control: Posterior lean Standing balance support: Bilateral upper extremity supported (via therapists) Standing balance-Leahy Scale: Poor Standing balance comment: significant posterior bias in static standing despite cues for posture and postioning over BOS                            ADL Overall ADL's : Needs assistance/impaired Eating/Feeding: Supervision/ safety;Set up   Grooming: Minimal assistance   Upper Body Bathing: Minimal assitance;Sitting   Lower Body Bathing: Moderate assistance;Sit to/from stand   Upper Body Dressing : Moderate assistance;Sitting   Lower Body Dressing: Moderate assistance;Sit to/from stand   Toilet Transfer: Moderate assistance;Stand-pivot;+2 for physical assistance   Toileting- Clothing Manipulation and Hygiene: Moderate assistance;Sit to/from stand       Functional mobility during ADLs: +2 for physical assistance;Moderate assistance (stand pivot only. Difficulty sequencing steps and sitting. )       Vision  recent cataract surgery   Perception     Praxis      Pertinent Vitals/Pain Pain Assessment: Faces Faces Pain Scale: Hurts little more Pain Location: surgical Pain Descriptors / Indicators: Grimacing Pain Intervention(s): Limited activity within patient's tolerance     Hand Dominance  R   Extremity/Trunk Assessment Upper Extremity Assessment Upper Extremity Assessment: Generalized weakness   Lower Extremity Assessment Lower Extremity Assessment: Generalized weakness   Cervical / Trunk  Assessment Cervical / Trunk Assessment: Other exceptions Cervical / Trunk Exceptions: LVAD  placed   Communication Communication Communication: No difficulties   Cognition Arousal/Alertness: Lethargic Behavior During Therapy: Flat affect Overall Cognitive Status: Impaired/Different from baseline Area of Impairment: Attention;Memory;Following commands;Problem solving Orientation Level: Disoriented to;Time Current Attention Level: Focused Memory: Decreased short-term memory Following Commands: Follows one step commands inconsistently Safety/Judgement: Decreased awareness of safety;Decreased awareness of deficits Awareness: Intellectual Problem Solving: Slow processing;Difficulty sequencing;Requires verbal cues;Requires tactile cues;Decreased initiation General Comments: Pt unaware of control box and began to pull on drive line. Educated pt on purpose of control box and then pt resonded that the box powered his w/c. Daughter stated that PTA, her father had demosntrated difficulty with memeory and problem solving for @ 6 mos.   General Comments       Exercises   Other Exercises Other Exercises: gneral LE ROM, long arc quads sitting in chair - easily distracted despite cues   Shoulder Instructions      Home Living Family/patient expects to be discharged to:: Unsure Living Arrangements: Spouse/significant other Available Help at Discharge: Family;Available 24 hours/day Type of Home: House Home Access: Stairs to enter [employer] of Steps: 3 Entrance Stairs-Rails: Can reach both;Left;Right Home Layout: Two level;Able to live on main level with bedroom/bathroom     Bathroom Shower/Tub: Walk-in shower         Home Equipment: Shower seat - built in;Other (comment)   Additional Comments: Pt has almost every home equipment imaginable as 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      Prior Functioning/Environment Level of Independence: Independent             OT Diagnosis: Generalized weakness;Cognitive deficits;Acute pain   OT Problem List: Decreased  strength;Decreased range of motion;Decreased activity tolerance;Impaired balance (sitting and/or standing);Decreased coordination;Decreased cognition;Decreased safety awareness;Decreased knowledge of use of DME or AE;Decreased knowledge of precautions;Cardiopulmonary status limiting activity;Pain   OT Treatment/Interventions: Self-care/ADL training;Therapeutic exercise;Energy conservation;DME and/or AE instruction;Therapeutic activities;Cognitive remediation/compensation;Patient/family education;Balance training    OT Goals(Current goals can be found in the care plan section) Acute Rehab OT Goals Patient Stated Goal: to be able to go to the beach again OT Goal Formulation: With patient/family Time For Goal Achievement: 09/16/15 Potential to Achieve Goals: Good  OT Frequency: Min 3X/week   Barriers to D/C:            Co-evaluation              End of Session Equipment Utilized During Treatment: Oxygen Nurse Communication: Mobility status  Activity Tolerance: Patient limited by fatigue Patient left: in chair;with call bell/phone within reach   Time: 1515-1551 OT Time Calculation (min): 36 min Charges:  OT General Charges $OT Visit: 1 Procedure OT Evaluation $OT Eval High Complexity: 1 Procedure G-Codes:    Annel Zunker,HILLARY October 01, 2015, 4:56 PM   Luisa Dago, OTR/L  [phone] 10/01/2015

## 2015-09-02 NOTE — Progress Notes (Signed)
PT Cancellation Note  Patient Details Name: [name] MRN: [id number] DOB: [birth year]   Cancelled Treatment:    Reason Eval/Treat Not Completed: Other (comment) (nsg request hold at this time to pull lines, will re-attemt as time permits)   Fabio Asa 09/02/2015, 8:55 AM Charlotte Crumb, PT DPT  [phone]

## 2015-09-02 NOTE — Progress Notes (Addendum)
Physical Therapy Treatment Patient Details Name: [name] MRN: [id number] DOB: [birth year] Today's Date: 09/02/2015    History of Present Illness Pt is a 75 y/o M who was admitted on 08/21/2015 from [facility] with acute on chronic systolic heart failure. He underwent LVAD placement on 08/30/15. Pt's PMH inlcudes frequent PVCs, nonischemic cardiomyopathy, automatic ICD. Pt had LVAD placed 6/23.    PT Comments    Patient seen for OOB and progression of activity. At this time, patient remains significantly limited with overall activity tolerance and physical mobility. Continues to require +2 maximal assist, poorly receptive to cues and decreased overall awareness. Did not attempt power conversion or LVAD education at this time due to poor receptivity and confusion. Will continue to see and progress as tolerated. Overall VSS (attempted room air while sitting EOB, desaturated to 89%, replaced 3 liters ).  Follow Up Recommendations  SNF;Supervision/Assistance - 24 hour     Equipment Recommendations  None recommended by PT    Recommendations for Other Services OT consult     Precautions / Restrictions Precautions Precautions: Sternal Precaution Comments: h/o frequent PVCs; provided pt w/ and reviewed sternal precaution handout Restrictions Weight Bearing Restrictions: Yes Other Position/Activity Restrictions: sternal precautions    Mobility  Bed Mobility Overal bed mobility: Needs Assistance Bed Mobility: Supine to Sit     Supine to sit: Max assist;+2 for physical assistance     General bed mobility comments: Increased time to perform, max cues, assist for trunk rotation and elevation to upright, poor compliance with sternal precuations despite cues  Transfers Overall transfer level: Needs assistance Equipment used: 2 person hand held assist (face to face with chuck pad) Transfers: Sit to/from Stand;Stand Pivot Transfers Sit to Stand: Max assist;+2  physical assistance Stand pivot transfers: Max assist;+2 physical assistance       General transfer comment: mac VCs for posture and positioning, assist to scoot to EOb and initiate standing, patient with posterior lean assist for stability. Max multimodal cues for pivotal movement to chair. Patient with abbreviated shuffling steps. increased time and effort. Some DOE noted  Ambulation/Gait                 Information systems manager Rankin (Stroke Patients Only)       Balance   Sitting-balance support: No upper extremity supported Sitting balance-Leahy Scale: Poor   Postural control: Posterior lean Standing balance support: Bilateral upper extremity supported (via therapists) Standing balance-Leahy Scale: Poor Standing balance comment: significant posterior bias in static standing despite cues for posture and postioning over BOS                    Cognition Arousal/Alertness: Awake/alert Behavior During Therapy: Flat affect Overall Cognitive Status: Impaired/Different from baseline Area of Impairment: Orientation;Attention;Memory;Following commands;Safety/judgement;Awareness;Problem solving Orientation Level: Disoriented to;Time Current Attention Level: Focused Memory: Decreased short-term memory Following Commands: Follows one step commands inconsistently Safety/Judgement: Decreased awareness of safety;Decreased awareness of deficits Awareness: Intellectual Problem Solving: Slow processing;Difficulty sequencing;Requires verbal cues;Requires tactile cues;Decreased initiation General Comments: multi-modal cues to perform mobility, poor recall of LVAD device.     Exercises Other Exercises Other Exercises: gneral LE ROM, long arc quads sitting in chair - easily distracted despite cues    General Comments General comments (skin integrity, edema, etc.): sat EOB with room air, desaturated to 89%, replaced 3 liters        Pertinent Vitals/Pain  Pain Assessment: Faces Faces Pain Scale: Hurts little more Pain Location: surgical site Pain Descriptors / Indicators: Operative site guarding Pain Intervention(s): Monitored during session    Home Living                      Prior Function            PT Goals (current goals can now be found in the care plan section) Acute Rehab PT Goals Patient Stated Goal: didn't state PT Goal Formulation: Patient unable to participate in goal setting Time For Goal Achievement: 09/11/15 Potential to Achieve Goals: Good Progress towards PT goals: Progressing toward goals (very modestly, pt still extremely limited with mobility)    Frequency  Min 3X/week    PT Plan Current plan remains appropriate    Co-evaluation             End of Session   Activity Tolerance: Patient limited by fatigue Patient left: in chair;with call bell/phone within reach;with nursing/sitter in room     Time: 9604-5409 PT Time Calculation (min) (ACUTE ONLY): 36 min  Charges:  $Therapeutic Activity: 8-22 mins                    G CodesFabio Asa 30-Sep-2015, 3:59 PM  Charlotte Crumb, PT DPT  [phone]

## 2015-09-02 NOTE — Progress Notes (Signed)
Nutrition Follow-up  DOCUMENTATION CODES:   Severe malnutrition in context of chronic illness  INTERVENTION:   Ensure Enlive po BID, each supplement provides 350 kcal and 20 grams of protein   Consider replacing CORTRAK feeding tube for supplemental nutrition support  NUTRITION DIAGNOSIS:   Malnutrition related to chronic illness as evidenced by severe depletion of body fat, severe depletion of muscle mass, energy intake < or equal to 75% for > or equal to 1 month, ongoing  GOAL:   Patient will meet greater than or equal to 90% of their needs, currently unmet  MONITOR:   PO intake, Supplement acceptance, Labs, Weight trends, I & O's (TF re-initiation)  ASSESSMENT:   75 y/o with LMNA cardiomyopathy admitted 6/14 with low output HF Underwent cath 6/15. Normal cors. EF 10% with low cardiac output. Has diuresed 20 pounds. Feels better. Co-ox remains low. Still with frequent ectopy   Patient s/p procedure 6/23: INSERTION OF IMPLANTABLE LEFT VENTRICULAR ASSIST DEVICE  Pt extubated 6/24. S/p bedside swallow evaluation 6/26. Pt confused >> working with PT >> Per RN, pt not eating. Was receiving nutrition support via CORTRAK tube prior to surgery (Jevity 1.5 formula). Will order oral nutrition supplements at this time, however, he will likely need non-oral means of support.  Diet Order:  Diet full liquid Room service appropriate?: Yes; Fluid consistency:: Thin  Skin:  Wound (see comment) (surgical chest incision )  Last BM:  N/A  Height:   Ht Readings from Last 1 Encounters:  08/21/15 6\' 2"  (1.88 m)    Weight:   Wt Readings from Last 1 Encounters:  09/02/15 187 lb 2.7 oz (84.9 kg)    Ideal Body Weight:  86.36 kg  BMI:  Body mass index is 24.02 kg/(m^2).  Estimated Nutritional Needs:   Kcal:  2000-2200  Protein:  100-115 grams  Fluid:  Per MD  EDUCATION NEEDS:   No education needs identified at this time  Maureen Chatters, RD, LDN Pager #:  [phone] After-Hours Pager #: [phone]

## 2015-09-02 NOTE — Progress Notes (Signed)
CSW met with wife and daughter in the waiting room. Patient's wife reports patient is struggling with the recovery although "heard that day 3 and 4 are the worst". Patient's brother had an LVAD and transplant and has been supportive to wife and family. Wife and daughter spoke at length about motivating support to patient and encouraging words shared with him over the past few days. Wife has spent most days and nights at the hospital and plans to go home this afternoon to get some rest and catch up on mail and errands. Daughter will remain as patient is comforted by knowing that family are in house. CSW provided supportive intervention and encouraged wife to get as much rest as she is primary caregiver and will need her strength in the coming weeks. Wife and daughter acknowledge and will try to get some rest. CSW will continue to follow for support throughout recovery. Raquel Sarna, LCSW [phone]

## 2015-09-02 NOTE — Evaluation (Signed)
Clinical/Bedside Swallow Evaluation Patient Details  Name: DEEJAY KOPPELMAN MRN: [id number] Date of Birth: 1940/11/25  Today's Date: 09/02/2015 Time: SLP Start Time (ACUTE ONLY): 0914 SLP Stop Time (ACUTE ONLY): 0926 SLP Time Calculation (min) (ACUTE ONLY): 12 min  Past Medical History:  Past Medical History  Diagnosis Date  . Nephrolithiasis   . GERD (gastroesophageal reflux disease)   . Insomnia   . Nonischemic cardiomyopathy (HCC)     Cardiac catheterization 12/2011 with LVEF 45%, no significant obstructive CAD  . Automatic implantable cardioverter-defibrillator in situ   . H/O hiatal hernia   . Osteoarthritis     "knees" (12/14/2012)  . Pneumonia     Dec 2016  . CHF (congestive heart failure) (HCC)    Past Surgical History:  Past Surgical History  Procedure Laterality Date  . Exploratory laparotomy  2006?  Marland Kitchen Appendectomy    . Volvulus reduction  2007  . Posterior fusion cervical spine  2001  . Bi-ventricular implantable cardioverter defibrillator  (crt-d)  12/14/2012  . Cardiac catheterization  2003?; ~ 11/2012  . Knee arthroscopy Left 1990's  . Lithotripsy      "twice" (12/14/2012)  . Cystoscopy w/ stone manipulation      "3-4 times" (12/14/2012)  . Bi-ventricular implantable cardioverter defibrillator N/A 12/14/2012    Procedure: BI-VENTRICULAR IMPLANTABLE CARDIOVERTER DEFIBRILLATOR  (CRT-D);  Surgeon: Duke Salvia, MD;  Location: [facility] CATH LAB;  Service: Cardiovascular;  Laterality: N/A;  . Cataract extraction w/phaco Left 05/13/2015    Procedure: CATARACT EXTRACTION PHACO AND INTRAOCULAR LENS PLACEMENT LEFT EYE;  CDE:  3.49;  Surgeon: Susa Simmonds, MD;  Location: [facility] ORS;  Service: Ophthalmology;  Laterality: Left;  . Cataract extraction w/phaco Right 07/22/2015    Procedure: CATARACT EXTRACTION PHACO AND INTRAOCULAR LENS PLACEMENT RIGHT EYE CDE=4.50;  Surgeon: Susa Simmonds, MD;  Location: [facility] ORS;  Service: Ophthalmology;  Laterality: Right;  . Cardiac  catheterization N/A 08/22/2015    Procedure: Right/Left Heart Cath and Coronary Angiography;  Surgeon: Dolores Patty, MD;  Location: [facility] INVASIVE CV LAB;  Service: Cardiovascular;  Laterality: N/A;   HPI:  Pt is a 75 y/o M who was admitted on 08/21/2015 from the [facility] with acute on chronic systolic heart failure. He had LVAD placed 6/23. Intubated until 6/24. Pt's PMH inlcudes frequent PVCs, nonischemic cardiomyopathy, automatic ICD.    Assessment / Plan / Recommendation Clinical Impression  Pt demonstrates normal swallow function with no signs of aspiration. Pts vocal quality is clear and pt has a strong cough with effort. Given brief period of intubation with plenty of time post extubation prior to diet initiation, risk of aspiration is low. Pt may continue full liquid diet and upgrade per MD. No SLP f/u needed, will sign off.     Aspiration Risk  Mild aspiration risk    Diet Recommendation Regular;Thin liquid   Liquid Administration via: Cup;Straw Medication Administration: Whole meds with liquid Supervision: Staff to assist with self feeding Compensations: Slow rate;Small sips/bites Postural Changes: Seated upright at 90 degrees    Other  Recommendations Oral Care Recommendations: Oral care BID   Follow up Recommendations  None    Frequency and Duration            Prognosis        Swallow Study   General HPI: Pt is a 75 y/o M who was admitted on 08/21/2015 from the [facility] with acute on chronic systolic heart failure. He had LVAD placed 6/23. Intubated  until 6/24. Pt's PMH inlcudes frequent PVCs, nonischemic cardiomyopathy, automatic ICD.  Type of Study: Bedside Swallow Evaluation Previous Swallow Assessment: none Diet Prior to this Study: Thin liquids Temperature Spikes Noted: No Respiratory Status: Nasal cannula History of Recent Intubation: Yes Length of Intubations (days): 2 days Date extubated: 08/31/15 Behavior/Cognition:  Alert;Cooperative Oral Cavity Assessment: Within Functional Limits Oral Care Completed by SLP: No Oral Cavity - Dentition: Missing dentition;Adequate natural dentition Vision: Functional for self-feeding Self-Feeding Abilities: Needs assist Patient Positioning: Upright in bed Baseline Vocal Quality: Low vocal intensity Volitional Cough: Strong (with effort and encouragement) Volitional Swallow: Able to elicit    Oral/Motor/Sensory Function Overall Oral Motor/Sensory Function: Within functional limits   Ice Chips     Thin Liquid Thin Liquid: Within functional limits Presentation: Cup;Straw    Nectar Thick Nectar Thick Liquid: Not tested   Honey Thick Honey Thick Liquid: Not tested   Puree Puree: Within functional limits   Solid   GO   Solid: Within functional limits       Asante [facility], MA CCC-SLP [phone]  Billi Bright, Riley Nearing 09/02/2015,10:13 AM

## 2015-09-02 NOTE — Progress Notes (Signed)
OT Cancellation Note  Patient Details Name: MONTREY EKLEBERRY MRN: [id number] DOB: 12-07-1940   Cancelled Treatment:    Reason Eval/Treat Not Completed: Medical issues which prohibited therapy - RN requesting OT and PT hold off at this time due to needing pul lines and have PICC line placed.  Will try back as schedule allows.   Angelene Giovanni [place], OTR/L [phone]  09/02/2015, 10:06 AM

## 2015-09-02 NOTE — Progress Notes (Signed)
Peripherally Inserted Central Catheter/Midline Placement  The IV Nurse has discussed with the patient and/or persons authorized to consent for the patient, the purpose of this procedure and the potential benefits and risks involved with this procedure.  The benefits include less needle sticks, lab draws from the catheter and patient may be discharged home with the catheter.  Risks include, but not limited to, infection, bleeding, blood clot (thrombus formation), and puncture of an artery; nerve damage and irregular heat beat.  Alternatives to this procedure were also discussed.  PICC/Midline Placement Documentation        [name] 09/02/2015, 10:45 AM

## 2015-09-02 NOTE — Care Management Important Message (Signed)
Important Message  Patient Details  Name: WAKE ACRE MRN: [id number] Date of Birth: Apr 02, 1940   Medicare Important Message Given:  Yes    Kyla Balzarine 09/02/2015, 10:33 AM

## 2015-09-03 ENCOUNTER — Encounter: Payer: Self-pay | Admitting: Internal Medicine

## 2015-09-03 ENCOUNTER — Encounter: Payer: Self-pay | Admitting: Licensed Clinical Social Worker

## 2015-09-03 ENCOUNTER — Encounter (HOSPITAL_COMMUNITY): Payer: Self-pay | Admitting: Cardiothoracic Surgery

## 2015-09-03 ENCOUNTER — Inpatient Hospital Stay (HOSPITAL_COMMUNITY): Payer: Medicare Other

## 2015-09-03 DIAGNOSIS — R410 Disorientation, unspecified: Secondary | ICD-10-CM

## 2015-09-03 LAB — TYPE AND SCREEN
ABO/RH(D): O POS
Antibody Screen: NEGATIVE
Unit division: 0

## 2015-09-03 LAB — GLUCOSE, CAPILLARY
GLUCOSE-CAPILLARY: 115 mg/dL — AB (ref 65–99)
GLUCOSE-CAPILLARY: 140 mg/dL — AB (ref 65–99)
GLUCOSE-CAPILLARY: 146 mg/dL — AB (ref 65–99)
GLUCOSE-CAPILLARY: 176 mg/dL — AB (ref 65–99)
Glucose-Capillary: 115 mg/dL — ABNORMAL HIGH (ref 65–99)
Glucose-Capillary: 106 mg/dL — ABNORMAL HIGH (ref 65–99)

## 2015-09-03 LAB — COMPREHENSIVE METABOLIC PANEL
ALT: 30 U/L (ref 17–63)
AST: 36 U/L (ref 15–41)
Albumin: 2.9 g/dL — ABNORMAL LOW (ref 3.5–5.0)
Alkaline Phosphatase: 53 U/L (ref 38–126)
Anion gap: 8 (ref 5–15)
BUN: 21 mg/dL — ABNORMAL HIGH (ref 6–20)
CO2: 30 mmol/L (ref 22–32)
Calcium: 8.8 mg/dL — ABNORMAL LOW (ref 8.9–10.3)
Chloride: 95 mmol/L — ABNORMAL LOW (ref 101–111)
Creatinine, Ser: 0.89 mg/dL (ref 0.61–1.24)
GFR calc Af Amer: >60 mL/min (ref >60)
GFR calc non Af Amer: >60 mL/min (ref >60)
Glucose, Bld: 112 mg/dL — ABNORMAL HIGH (ref 65–99)
Potassium: 3.4 mmol/L — ABNORMAL LOW (ref 3.5–5.1)
Sodium: 133 mmol/L — ABNORMAL LOW (ref 135–145)
Total Bilirubin: 1.5 mg/dL — ABNORMAL HIGH (ref 0.3–1.2)
Total Protein: 5.3 g/dL — ABNORMAL LOW (ref 6.5–8.1)

## 2015-09-03 LAB — CBC WITH DIFFERENTIAL/PLATELET
Basophils Absolute: 0 10*3/uL (ref 0.0–0.1)
Basophils Relative: 0 %
Eosinophils Absolute: 0.1 10*3/uL (ref 0.0–0.7)
Eosinophils Relative: 2 %
HCT: 29.1 % — ABNORMAL LOW (ref 39.0–52.0)
Hemoglobin: 9 g/dL — ABNORMAL LOW (ref 13.0–17.0)
Lymphocytes Relative: 10 %
Lymphs Abs: 0.6 10*3/uL — ABNORMAL LOW (ref 0.7–4.0)
MCH: 26.2 pg (ref 26.0–34.0)
MCHC: 30.9 g/dL (ref 30.0–36.0)
MCV: 84.6 fL (ref 78.0–100.0)
Monocytes Absolute: 0.6 10*3/uL (ref 0.1–1.0)
Monocytes Relative: 10 %
Neutro Abs: 4.6 10*3/uL (ref 1.7–7.7)
Neutrophils Relative %: 78 %
Platelets: 113 10*3/uL — ABNORMAL LOW (ref 150–400)
RBC: 3.44 MIL/uL — ABNORMAL LOW (ref 4.22–5.81)
RDW: 16.2 % — ABNORMAL HIGH (ref 11.5–15.5)
WBC: 5.9 10*3/uL (ref 4.0–10.5)

## 2015-09-03 LAB — POCT I-STAT, CHEM 8
BUN: 20 mg/dL (ref 6–20)
CREATININE: 0.7 mg/dL (ref 0.61–1.24)
Calcium, Ion: 1.12 mmol/L (ref 1.12–1.23)
Chloride: 91 mmol/L — ABNORMAL LOW (ref 101–111)
GLUCOSE: 157 mg/dL — AB (ref 65–99)
HCT: 29 % — ABNORMAL LOW (ref 39.0–52.0)
HEMOGLOBIN: 9.9 g/dL — AB (ref 13.0–17.0)
Potassium: 3.9 mmol/L (ref 3.5–5.1)
Sodium: 137 mmol/L (ref 135–145)
TCO2: 32 mmol/L (ref 0–100)

## 2015-09-03 LAB — PROTIME-INR
INR: 1.85 — ABNORMAL HIGH (ref 0.00–1.49)
PROTHROMBIN TIME: 21.3 s — AB (ref 11.6–15.2)

## 2015-09-03 LAB — CARBOXYHEMOGLOBIN
Carboxyhemoglobin: 1.9 % — ABNORMAL HIGH (ref 0.5–1.5)
Carboxyhemoglobin: 1.8 % — ABNORMAL HIGH (ref 0.5–1.5)
Methemoglobin: 0.8 % (ref 0.0–1.5)
Methemoglobin: 0.7 % (ref 0.0–1.5)
O2 Saturation: 61.9 %
O2 Saturation: 61.4 %
Total hemoglobin: 9.2 g/dL — ABNORMAL LOW (ref 13.5–18.0)
Total hemoglobin: 10 g/dL — ABNORMAL LOW (ref 13.5–18.0)

## 2015-09-03 LAB — APTT
aPTT: 60 seconds — ABNORMAL HIGH (ref 24–37)
aPTT: 62 seconds — ABNORMAL HIGH (ref 24–37)

## 2015-09-03 LAB — MAGNESIUM: Magnesium: 2.3 mg/dL (ref 1.7–2.4)

## 2015-09-03 LAB — LACTATE DEHYDROGENASE: LDH: 263 U/L — ABNORMAL HIGH (ref 98–192)

## 2015-09-03 MED ORDER — WARFARIN SODIUM 5 MG PO TABS
5.0000 mg | ORAL_TABLET | Freq: Once | ORAL | Status: AC
  Administered 2015-09-03: 5 mg via ORAL
  Filled 2015-09-03: qty 1

## 2015-09-03 MED ORDER — FUROSEMIDE 10 MG/ML IJ SOLN
20.0000 mg | Freq: Two times a day (BID) | INTRAMUSCULAR | Status: DC
  Administered 2015-09-04 – 2015-09-05 (×3): 20 mg via INTRAVENOUS
  Filled 2015-09-03 (×3): qty 2

## 2015-09-03 MED ORDER — ALBUMIN HUMAN 25 % IV SOLN
12.5000 g | Freq: Four times a day (QID) | INTRAVENOUS | Status: AC
  Administered 2015-09-03 (×2): 12.5 g via INTRAVENOUS
  Filled 2015-09-03 (×2): qty 50

## 2015-09-03 MED ORDER — AMIODARONE HCL IN DEXTROSE 360-4.14 MG/200ML-% IV SOLN
30.0000 mg/h | INTRAVENOUS | Status: DC
  Administered 2015-09-03 – 2015-09-04 (×3): 30 mg/h via INTRAVENOUS
  Filled 2015-09-03 (×3): qty 200

## 2015-09-03 MED ORDER — POTASSIUM CHLORIDE 10 MEQ/50ML IV SOLN
10.0000 meq | INTRAVENOUS | Status: AC
Start: 2015-09-03 — End: 2015-09-03
  Administered 2015-09-03 (×2): 10 meq via INTRAVENOUS
  Filled 2015-09-03 (×2): qty 50

## 2015-09-03 MED ORDER — OLANZAPINE 2.5 MG PO TABS
2.5000 mg | ORAL_TABLET | Freq: Every day | ORAL | Status: DC
  Administered 2015-09-03 – 2015-09-04 (×2): 2.5 mg via ORAL
  Filled 2015-09-03 (×2): qty 1

## 2015-09-03 MED ORDER — ASPIRIN EC 81 MG PO TBEC
81.0000 mg | DELAYED_RELEASE_TABLET | Freq: Every day | ORAL | Status: DC
  Administered 2015-09-04 – 2015-09-19 (×16): 81 mg via ORAL
  Filled 2015-09-03 (×16): qty 1

## 2015-09-03 MED ORDER — FUROSEMIDE 10 MG/ML IJ SOLN
20.0000 mg | Freq: Once | INTRAMUSCULAR | Status: AC
  Administered 2015-09-03: 20 mg via INTRAVENOUS
  Filled 2015-09-03: qty 2

## 2015-09-03 MED ORDER — POTASSIUM CHLORIDE 10 MEQ/50ML IV SOLN
10.0000 meq | INTRAVENOUS | Status: AC
  Administered 2015-09-03 (×2): 10 meq via INTRAVENOUS
  Filled 2015-09-03 (×2): qty 50

## 2015-09-03 MED ORDER — METOLAZONE 5 MG PO TABS
5.0000 mg | ORAL_TABLET | Freq: Once | ORAL | Status: AC
  Administered 2015-09-03: 5 mg via ORAL
  Filled 2015-09-03: qty 1

## 2015-09-03 MED ORDER — DOCUSATE SODIUM 100 MG PO CAPS
100.0000 mg | ORAL_CAPSULE | Freq: Two times a day (BID) | ORAL | Status: DC | PRN
  Administered 2015-09-09 – 2015-09-17 (×2): 100 mg via ORAL
  Filled 2015-09-03 (×2): qty 1

## 2015-09-03 MED ORDER — AMIODARONE HCL IN DEXTROSE 360-4.14 MG/200ML-% IV SOLN
60.0000 mg/h | INTRAVENOUS | Status: AC
  Administered 2015-09-03: 60 mg/h via INTRAVENOUS
  Filled 2015-09-03: qty 200

## 2015-09-03 MED ORDER — CHLORHEXIDINE GLUCONATE 0.12 % MT SOLN
OROMUCOSAL | Status: AC
  Filled 2015-09-03: qty 15

## 2015-09-03 MED ORDER — POTASSIUM CHLORIDE 10 MEQ/50ML IV SOLN
10.0000 meq | INTRAVENOUS | Status: AC | PRN
  Administered 2015-09-03 (×3): 10 meq via INTRAVENOUS
  Filled 2015-09-03 (×3): qty 50

## 2015-09-03 NOTE — Progress Notes (Signed)
CSW met at bedside with patient and wife. Patient very confused and unable to maintain consistent topic in conversation. Wife spoke about events of the day and frustration with some of the comments made by patient directed at her. Wife became tearful when discussing and ended with "this is not him" and "I just want him to get better". Wife appears discouraged with today's events. CSW offered supportive intervention and will continue to follow for support throughout recovery. Raquel Sarna, LCSW [phone]

## 2015-09-03 NOTE — Progress Notes (Signed)
   Frequent PVCs and NSVT. Mag 2.3 K 3.4   Received 2 K runs. Give another 40 meq of potassium.   Stop po amio and start amio drip.   [employer] to interrogate today.   Mae Cianci NP_C  1:10 PM

## 2015-09-03 NOTE — Progress Notes (Signed)
Pt having multiple PVCs in couplets and bigeminy.  VAD team made aware and Kirt Boys is at bedside.  VSS no PI events at this time.  Will continue to monitor.

## 2015-09-03 NOTE — Plan of Care (Signed)
Problem: Education: Goal: Knowledge of the prescribed therapeutic regimen will improve Outcome: Progressing Family observed dressing change this evening.  Questions answered, will continue to educate.
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
## 2015-09-03 NOTE — Progress Notes (Signed)
OT Cancellation Note  Patient Details Name: [name] MRN: [id number] DOB: [birth year]   Cancelled Treatment:    Reason Eval/Treat Not Completed: Medical issues which prohibited therapy. Pt not medically appropriate today . Discussed with nsg. Will attempt tomorrow.   [facility] Portia Wisdom, OTR/L  [phone] 09/03/2015 09/03/2015, 2:32 PM

## 2015-09-03 NOTE — Progress Notes (Signed)
Patient ID: [name], male   DOB: Sep 14, 1940, 75 y.o.   MRN: [id number]    VAD team Rounding Note   Subjective:    75 y/o with LMNA cardiomyopathy admitted 6/14 with low output HF  Underwent cath 6/15. Normal cors. EF 10% with low cardiac output.   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    Epi off. Dopamine added for poor urine output. Remains on milrinone 0.3. MAPs in 90s   Confused overnight. Sundowning. Pulling at lines. Denies dyspnea. Diuresing well now. Weight still up 20 pounds.   Co-ox 62%  Flow: 5.6 L/min  Speed: 9000 PI 6.5  PP 5.5    Objective:   Weight Range:  Vital Signs:   Temp:  [97.6 F (36.4 C)-97.7 F (36.5 C)] 97.7 F (36.5 C) (06/27 0005) Pulse Rate:  [25-108] 91 (06/27 0700) Resp:  [11-30] 21 (06/27 0700) BP: (83-112)/(59-94) 93/81 mmHg (06/27 0700) SpO2:  [85 %-100 %] 100 % (06/27 0700) Weight:  [83.553 kg (184 lb 3.2 oz)] 83.553 kg (184 lb 3.2 oz) (06/27 0500) Last BM Date: 09/02/15  Weight change: Filed Weights   09/01/15 0500 09/02/15 0500 09/03/15 0500  Weight: 85.6 kg (188 lb 11.4 oz) 84.9 kg (187 lb 2.7 oz) 83.553 kg (184 lb 3.2 oz)    Intake/Output:   Intake/Output Summary (Last 24 hours) at 09/03/15 0809 Last data filed at 09/03/15 0740  Gross per 24 hour  Intake 1871.36 ml  Output   2235 ml  Net -363.64 ml     Physical Exam: CVP 12  General: In chair. Confused HEENT: normal  Neck: supple. RIJ TLC Cor: PMI laterally displaced. LVAD hum.  Lungs: Decreased breath sounds at bases bilaterally.  Abdomen: soft, mildly distended. No hepatosplenomegaly. Hypoactive bowel sounds. Driveline site ok. Securement device in place.  Extremities: no cyanosis, clubbing, rash, 2+ edema  Neuro: Knows name and year but otherwise confused. Non focal   Telemetry:  A-V sequential pacing frequnet PVCs 80-90s  Labs: Basic Metabolic Panel:  Recent Labs Lab 08/30/15 1545  08/31/15 0400  08/31/15 1644 09/01/15 0344 09/01/15 1552 09/02/15 0404  09/02/15 1659 09/03/15 0345  NA 136  < > 134*  < >  --  130* 131* 130* 132* 133*  K 3.3*  < > 4.6  < >  --  4.5 4.3 3.8 3.8 3.4*  CL 100*  < > 100*  < >  --  97* 92* 95* 91* 95*  CO2 27  --  27  --   --  26  --  29  --  30  GLUCOSE 109*  < > 96  < >  --  134* 141* 132* 145* 112*  BUN 20  < > 18  < >  --  19 22* 19 19 21*  CREATININE 0.93  < > 0.86  < > 0.86 0.85 0.80 0.85 0.70 0.89  CALCIUM 8.1*  --  8.3*  --   --  8.7*  --  8.6*  --  8.8*  MG 1.8  < > 2.5*  --  2.5* 2.5*  --  2.3  --  2.3  PHOS  --   --  3.6  --   --   --   --   --   --   --   < > = values in this interval not displayed.  Liver Function Tests:  Recent Labs Lab 08/30/15 0505 08/31/15 0400 09/01/15 0344 09/02/15 0404 09/03/15 0345  AST 27 74* 69*  47* 36  ALT ALKPHOS 53 39 40 40 53  BILITOT 0.9 3.2* 3.5* 2.2* 1.5*  PROT 5.9* 5.4* 5.8* 5.2* 5.3*  ALBUMIN 3.0* 3.2* 3.5 3.0* 2.9*   No results for input(s): LIPASE, AMYLASE in the last 168 hours. No results for input(s): AMMONIA in the last 168 hours.  CBC:  Recent Labs Lab 08/31/15 0400  08/31/15 1644 09/01/15 0344 09/01/15 1552 09/02/15 0404 09/02/15 1659 09/03/15 0345  WBC 7.5  --  7.8 9.8  --  7.8  --  5.9  NEUTROABS 6.5  --   --  8.6*  --  6.7  --  4.6  HGB 8.2*  < > 8.0* 8.0* 8.8* 7.8* 9.2* 9.0*  HCT 26.3*  < > 26.0* 26.2* 26.0* 25.8* 27.0* 29.1*  MCV 83.8  --  85.5 84.8  --  84.9  --  84.6  PLT 110*  --  119* 114*  --  115*  --  113*  < > = values in this interval not displayed.  Cardiac Enzymes: No results for input(s): CKTOTAL, CKMB, CKMBINDEX, TROPONINI in the last 168 hours.  BNP: BNP (last 3 results)  Recent Labs  05/13/15 2248 08/21/15 1345 08/31/15 0400  BNP 1235.6* 2132.9* 1387.0*    ProBNP (last 3 results) No results for input(s): PROBNP in the last 8760 hours.    Other results:  Imaging: Dg Chest Port 1 View  09/03/2015  CLINICAL DATA:  LVAD. EXAM: PORTABLE CHEST 1 VIEW COMPARISON:  09/02/2015.  FINDINGS: No change in support tubes and LVAD apparatus. Dual lead pacer is stable. Moderate edema without definite consolidation. No pneumothorax. IMPRESSION: Stable chest. Electronically Signed   By: Elsie Stain M.D.   On: 09/03/2015 07:38   Dg Chest Port 1 View  09/02/2015  CLINICAL DATA:  Confirm line placement.  New PICC. These results will be called to the ordering clinician or representative by the Radiology Department at the imaging location. EXAM: PORTABLE CHEST 1 VIEW COMPARISON:  Earlier today FINDINGS: New right upper extremity PICC with tip at the upper cavoatrial junction. No interval displacement of biventricular ICD/pacer leads from the left. LVAD present. Right IJ central lines, tip stable from prior, most distal at the upper cavoatrial junction. Atelectasis and layering pleural effusions. Left chest drain no longer seen. No pneumothorax. Stable cardiopericardial enlargement. IMPRESSION: 1. New right upper extremity PICC with tip at the upper cavoatrial junction. 2. Stable layering effusions and atelectasis. 3. Left chest tube no longer seen.  No pneumothorax Electronically Signed   By: Marnee Spring M.D.   On: 09/02/2015 11:39   Dg Chest Port 1 View  09/02/2015  CLINICAL DATA:  Left ventricular assist device, bilateral chest tubes EXAM: PORTABLE CHEST 1 VIEW COMPARISON:  Portable chest x-ray of September 01, 2015 FINDINGS: The lungs are well-expanded. There is no evidence of a pneumothorax. There may be pleural fluid layering posteriorly, bilaterally. The interstitial markings are slightly more conspicuous bilaterally today especially in the mid and lower lung zones. The bilateral chest tubes are in stable position. The cardiac silhouette remains enlarged. The left ventricular assist device is stable where visualized. The permanent pacemaker defibrillator also appears stable. The Cordis sheath and the right internal jugular venous catheters are in stable position. The retrocardiac region on  the left remains dense. IMPRESSION: 1. Slight interval increased interstitial prominence bilaterally. Persistent left lower lobe atelectasis. Probable small bilateral pleural effusions layering posteriorly. 2. Stable enlargement of the cardiac silhouette with mild  central pulmonary vascular congestion consistent with CHF. 3. The support apparatus is in stable position. Electronically Signed   By: David  [place] M.D.   On: 09/02/2015 07:38     Medications:     Scheduled Medications: . acetaminophen  1,000 mg Oral Q6H   Or  . acetaminophen (TYLENOL) oral liquid 160 mg/5 mL  1,000 mg Per Tube Q6H  . albumin human  12.5 g Intravenous Q6H  . amiodarone  200 mg Oral Daily  . antiseptic oral rinse  7 mL Mouth Rinse q12n4p  . aspirin EC  325 mg Oral Daily   Or  . aspirin  324 mg Per Tube Daily   Or  . aspirin  300 mg Rectal Daily  . bisacodyl  10 mg Oral Daily   Or  . bisacodyl  10 mg Rectal Daily  . chlorhexidine  15 mL Mouth Rinse BID  . feeding supplement (ENSURE ENLIVE)  237 mL Oral TID BM  . ferrous fumarate-b12-vitamic C-folic acid  1 capsule Oral TID PC  . furosemide  40 mg Intravenous BID  . insulin aspart  0-20 Units Subcutaneous Q4H  . metoCLOPramide (REGLAN) injection  10 mg Intravenous Q6H  . metolazone  5 mg Oral Once  . mirtazapine  15 mg Oral QHS  . OLANZapine  2.5 mg Oral QHS  . pantoprazole  40 mg Oral Daily  . potassium chloride  10 mEq Intravenous Q1 Hr x 2  . sodium chloride flush  10-40 mL Intracatheter Q12H  . sodium chloride flush  3 mL Intravenous Q12H  . Warfarin - Physician Dosing Inpatient   Does not apply q1800    Infusions: . sodium chloride Stopped (08/31/15 1700)  . sodium chloride 250 mL (08/31/15 0540)  . sodium chloride 10 mL/hr at 08/30/15 1500  . DOPamine 2.5 mcg/kg/min (09/02/15 1955)  . heparin 900 Units/hr (09/03/15 0740)  . lactated ringers Stopped (08/30/15 1500)  . lactated ringers Stopped (09/02/15 1200)  . milrinone 0.3 mcg/kg/min  (09/03/15 0015)  . norepinephrine (LEVOPHED) Adult infusion Stopped (09/01/15 1130)    PRN Medications: sodium chloride, docusate sodium, fentaNYL (SUBLIMAZE) injection, ondansetron (ZOFRAN) IV, polyvinyl alcohol, potassium chloride, sodium chloride flush, sodium chloride flush, traMADol   Assessment:   1. 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 EF 15%. Cath 8/14 with normal cors. 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  2. Frequent PVCs 3. Severe Malnutrition- Prealbumin 14.7 on 6/20 4. Acute post-op delirium/sundowning  Plan/Discussion:    POD 4 s/p HM II LVAD.  Continues to struggle a bit post-op. Diuresis improved with dopamine and IV lasix. Will continue. CXR is wet.   Now with sundowning/acute delirium. Will start zyprexa. Has sitter.   Co-ox ok. Continue milrinone and dopamine.   VAD parameters stable. Continue heparin/warfarin.   Rhythm stable on po amio.   D/w Dr. Donata Clay.    Length of Stay: 13 Arvilla Meres MD  09/03/2015, 8:09 AM

## 2015-09-04 ENCOUNTER — Inpatient Hospital Stay (HOSPITAL_COMMUNITY): Payer: Medicare Other

## 2015-09-04 LAB — CBC WITH DIFFERENTIAL/PLATELET
Basophils Absolute: 0 10*3/uL (ref 0.0–0.1)
Basophils Relative: 0 %
Eosinophils Absolute: 0.2 10*3/uL (ref 0.0–0.7)
Eosinophils Relative: 3 %
HCT: 31.1 % — ABNORMAL LOW (ref 39.0–52.0)
Hemoglobin: 9.6 g/dL — ABNORMAL LOW (ref 13.0–17.0)
Lymphocytes Relative: 12 %
Lymphs Abs: 0.7 10*3/uL (ref 0.7–4.0)
MCH: 26.3 pg (ref 26.0–34.0)
MCHC: 30.9 g/dL (ref 30.0–36.0)
MCV: 85.2 fL (ref 78.0–100.0)
Monocytes Absolute: 0.7 10*3/uL (ref 0.1–1.0)
Monocytes Relative: 12 %
Neutro Abs: 4.1 10*3/uL (ref 1.7–7.7)
Neutrophils Relative %: 73 %
Platelets: 132 10*3/uL — ABNORMAL LOW (ref 150–400)
RBC: 3.65 MIL/uL — ABNORMAL LOW (ref 4.22–5.81)
RDW: 16.5 % — ABNORMAL HIGH (ref 11.5–15.5)
WBC: 5.7 10*3/uL (ref 4.0–10.5)

## 2015-09-04 LAB — POCT I-STAT, CHEM 8
BUN: 21 mg/dL — ABNORMAL HIGH (ref 6–20)
CALCIUM ION: 1.18 mmol/L (ref 1.12–1.23)
CREATININE: 0.7 mg/dL (ref 0.61–1.24)
Chloride: 91 mmol/L — ABNORMAL LOW (ref 101–111)
Glucose, Bld: 187 mg/dL — ABNORMAL HIGH (ref 65–99)
HEMATOCRIT: 55 % — AB (ref 39.0–52.0)
HEMOGLOBIN: 18.7 g/dL — AB (ref 13.0–17.0)
Potassium: 4.3 mmol/L (ref 3.5–5.1)
SODIUM: 133 mmol/L — AB (ref 135–145)
TCO2: 30 mmol/L (ref 0–100)

## 2015-09-04 LAB — GLUCOSE, CAPILLARY
GLUCOSE-CAPILLARY: 134 mg/dL — AB (ref 65–99)
GLUCOSE-CAPILLARY: 175 mg/dL — AB (ref 65–99)
Glucose-Capillary: 104 mg/dL — ABNORMAL HIGH (ref 65–99)
Glucose-Capillary: 164 mg/dL — ABNORMAL HIGH (ref 65–99)
Glucose-Capillary: 174 mg/dL — ABNORMAL HIGH (ref 65–99)

## 2015-09-04 LAB — COMPREHENSIVE METABOLIC PANEL
ALT: 27 U/L (ref 17–63)
AST: 33 U/L (ref 15–41)
Albumin: 3 g/dL — ABNORMAL LOW (ref 3.5–5.0)
Alkaline Phosphatase: 52 U/L (ref 38–126)
Anion gap: 9 (ref 5–15)
BUN: 17 mg/dL (ref 6–20)
CO2: 29 mmol/L (ref 22–32)
Calcium: 8.6 mg/dL — ABNORMAL LOW (ref 8.9–10.3)
Chloride: 98 mmol/L — ABNORMAL LOW (ref 101–111)
Creatinine, Ser: 0.84 mg/dL (ref 0.61–1.24)
GFR calc Af Amer: >60 mL/min (ref >60)
GFR calc non Af Amer: >60 mL/min (ref >60)
Glucose, Bld: 102 mg/dL — ABNORMAL HIGH (ref 65–99)
Potassium: 3.6 mmol/L (ref 3.5–5.1)
Sodium: 136 mmol/L (ref 135–145)
Total Bilirubin: 0.9 mg/dL (ref 0.3–1.2)
Total Protein: 5.3 g/dL — ABNORMAL LOW (ref 6.5–8.1)

## 2015-09-04 LAB — CARBOXYHEMOGLOBIN
Carboxyhemoglobin: 2.1 % — ABNORMAL HIGH (ref 0.5–1.5)
Carboxyhemoglobin: 2.1 % — ABNORMAL HIGH (ref 0.5–1.5)
Methemoglobin: 0.7 % (ref 0.0–1.5)
Methemoglobin: 0.7 % (ref 0.0–1.5)
O2 Saturation: 65.3 %
O2 Saturation: 66 %
Total hemoglobin: 9.8 g/dL — ABNORMAL LOW (ref 13.5–18.0)
Total hemoglobin: 10 g/dL — ABNORMAL LOW (ref 13.5–18.0)

## 2015-09-04 LAB — PROTIME-INR
INR: 2.53 — ABNORMAL HIGH (ref 0.00–1.49)
Prothrombin Time: 26.9 seconds — ABNORMAL HIGH (ref 11.6–15.2)

## 2015-09-04 LAB — MAGNESIUM: Magnesium: 2.1 mg/dL (ref 1.7–2.4)

## 2015-09-04 LAB — LACTATE DEHYDROGENASE: LDH: 273 U/L — AB (ref 98–192)

## 2015-09-04 MED ORDER — INSULIN ASPART 100 UNIT/ML ~~LOC~~ SOLN
0.0000 [IU] | SUBCUTANEOUS | Status: AC
  Administered 2015-09-04 (×2): 4 [IU] via SUBCUTANEOUS
  Administered 2015-09-04: 3 [IU] via SUBCUTANEOUS
  Administered 2015-09-04 – 2015-09-05 (×2): 4 [IU] via SUBCUTANEOUS
  Administered 2015-09-05: 3 [IU] via SUBCUTANEOUS
  Administered 2015-09-05: 4 [IU] via SUBCUTANEOUS
  Administered 2015-09-05 – 2015-09-07 (×3): 3 [IU] via SUBCUTANEOUS
  Administered 2015-09-07: 2 [IU] via SUBCUTANEOUS
  Administered 2015-09-07 (×2): 3 [IU] via SUBCUTANEOUS
  Administered 2015-09-07: 4 [IU] via SUBCUTANEOUS

## 2015-09-04 MED ORDER — HALOPERIDOL LACTATE 5 MG/ML IJ SOLN
2.0000 mg | Freq: Once | INTRAMUSCULAR | Status: AC
  Administered 2015-09-04: 2 mg via INTRAVENOUS
  Filled 2015-09-04: qty 1

## 2015-09-04 MED ORDER — HALOPERIDOL LACTATE 5 MG/ML IJ SOLN
2.0000 mg | Freq: Four times a day (QID) | INTRAMUSCULAR | Status: DC | PRN
  Administered 2015-09-04 – 2015-09-05 (×2): 2 mg via INTRAVENOUS
  Filled 2015-09-04 (×2): qty 1

## 2015-09-04 MED ORDER — POTASSIUM CHLORIDE 10 MEQ/50ML IV SOLN
10.0000 meq | INTRAVENOUS | Status: AC
Start: 2015-09-04 — End: 2015-09-04
  Administered 2015-09-04 (×2): 10 meq via INTRAVENOUS
  Filled 2015-09-04 (×2): qty 50

## 2015-09-04 MED ORDER — POTASSIUM CHLORIDE 10 MEQ/50ML IV SOLN
10.0000 meq | INTRAVENOUS | Status: AC
  Administered 2015-09-04 (×2): 10 meq via INTRAVENOUS
  Filled 2015-09-04: qty 50

## 2015-09-04 MED ORDER — ALBUMIN HUMAN 5 % IV SOLN
12.5000 g | Freq: Once | INTRAVENOUS | Status: AC
  Administered 2015-09-04: 12.5 g via INTRAVENOUS
  Filled 2015-09-04: qty 250

## 2015-09-04 MED ORDER — WARFARIN SODIUM 2.5 MG PO TABS
2.5000 mg | ORAL_TABLET | Freq: Once | ORAL | Status: AC
  Administered 2015-09-04: 2.5 mg via ORAL
  Filled 2015-09-04: qty 1

## 2015-09-04 NOTE — Progress Notes (Signed)
I continue to follow pt's progress. Noted postop POD #5 LVAD. Please order an inpt rehab consult once pt anticipated to be a few days from potential discharge so that out physician can assess for admission.[phone]

## 2015-09-04 NOTE — Progress Notes (Signed)
OT Treatment Note  Appears delirius. Reports seeing chickens in his room Excellent progress today, walking around unit with +2 min A pushing w/c. VSS. Excllent family support. Continue to recommend CIR when medically stable.    09/04/15 1138  OT Visit Information  Last OT Received On 09/04/15  Assistance Needed +2  PT/OT/SLP Co-Evaluation/Treatment Yes  OT goals addressed during session ADL's and self-care;Strengthening/ROM  History of Present Illness Pt is a 75 y/o M who was [facility] with acute on chronic systolic heart failure. He is undergoing preparation this week for an LVAD placement (scheduled for 08/30/15). Pt's PMH inlcudes frequent PVCs, nonischemic cardiomyopathy, automatic ICD. Pt had LVAD placed 6/23.  Precautions  Precautions Sternal  Pain Assessment  Faces Pain Scale 0  Cognition  Arousal/Alertness Awake/alert  Behavior During Therapy Flat affect  Overall Cognitive Status Impaired/Different from baseline  Area of Impairment Orientation;Attention;Memory;Following commands;Safety/judgement;Awareness;Problem solving  Orientation Level Disoriented to;Time;Place;Situation  Current Attention Level Focused  Memory Decreased short-term memory;Decreased recall of precautions  Following Commands Follows one step commands inconsistently  Safety/Judgement Decreased awareness of safety;Decreased awareness of deficits  Awareness Intellectual  Problem Solving Slow processing;Difficulty sequencing;Requires verbal cues;Requires tactile cues;Decreased initiation  General Comments Pt appears to be hallucinating  ADL  Eating/Feeding Moderate assistance;Sitting (hand over hand to initiate drinkging and eating a cracker.)  Functional mobility during ADLs +2 for physical assistance;Minimal assistance;Cueing for safety;Cueing for sequencing (Mod A for stand to sit)  General ADL Comments Hand over hand requried at times for ADL due to apparent  delrium. Pt using sprite as mouth wash  Bed Mobility  Overal bed mobility Needs Assistance  Bed Mobility Supine to Sit  Supine to sit +2 for physical assistance;Mod assist  General bed mobility comments Assist to elevate trunk into sitting and bring hips to edge of bed  Balance  Overall balance assessment Needs assistance  Sitting-balance support Bilateral upper extremity supported;Feet supported  Sitting balance-Leahy Scale Poor  Sitting balance - Comments Min A for static sitting initially and then supervision but with UE support  Postural control Posterior lean  Standing balance support Bilateral upper extremity supported  Standing balance-Leahy Scale Poor  Standing balance comment UE support and min A for static standing  Restrictions  Weight Bearing Restrictions Yes  Other Position/Activity Restrictions sternal precautions  Transfers  Overall transfer level Needs assistance  Equipment used Ambulation equipment used;Pushed w/c  Transfer via Lift Equipment Stedy  Transfers Sit to/from [employer] Transfers  Sit to Stand +2 physical assistance;Mod assist  Stand pivot transfers +2 physical assistance;Mod assist  General transfer comment From bed stood with +2 min A and allowed pt to place hands on Stedy cross bar but monitored amount of pulling pt did. Used Stedy for pivot to [employer]. Stood from recliner with +2 mod A with w/c in front of pt for ambulation. Had to block pt's feet to keep from sliding. Initially some posterior leaning. Required +2 mod A for stand to sit due to pt's reluctance to sit due to confusion. Used downward pressure on hips and shoulders to assist pt to sit.  OT - End of Session  Equipment Utilized During Treatment Oxygen (4L)  Activity Tolerance Patient tolerated treatment well  Patient left in chair;with call bell/phone within reach;with family/visitor present;Other (comment) (left with family at window. Nsg aware)  Nurse Communication Mobility status   OT Assessment/Plan  OT Plan Discharge plan remains appropriate  OT Frequency (ACUTE ONLY) Min 3X/week  Recommendations for  Other Services Rehab consult  Follow Up Recommendations CIR;Supervision/Assistance - 24 hour  OT Equipment Other (comment)  OT Goal Progression  Progress towards OT goals Progressing toward goals  Acute Rehab OT Goals  Patient Stated Goal to be able to go to the beach again  OT Goal Formulation With patient/family  Time For Goal Achievement 09/16/15  Potential to Achieve Goals Good  ADL Goals  Pt Will Perform Upper Body Bathing with set-up;with supervision;sitting  Pt Will Perform Lower Body Bathing sit to/from stand;with min assist  Pt Will Perform Lower Body Dressing with min assist;sit to/from stand  Pt Will Transfer to Toilet with min guard assist;bedside commode;stand pivot transfer  Additional ADL Goal #1 Pt will demonstrte ability to manage LVAD equipmetn during ADL task with min vc  OT Time Calculation  OT Start Time (ACUTE ONLY) 1017  OT Stop Time (ACUTE ONLY) 1052  OT Time Calculation (min) 35 min  OT General Charges  $OT Visit 1 Procedure  OT Treatments  $Self Care/Home Management  8-22 mins  [facility], OTR/L  [phone] 09/04/2015

## 2015-09-04 NOTE — Progress Notes (Signed)
Admitted 08/21/15 due to acute on chronic systolic heart failure; severe NICM.  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 Maren Beach as Destination Therapy VAD.  Vital signs: HR:  91 Doppler MAP:  86 Automated BP: 107/95 (101) O2 Sat: 98% on 3 L/[place] Wt in lbs:  158 > 199 > 188 > 187 > 184 lbs  CVP: 8  LVAD interrogation reveals:  Speed:  9000 Flow: 4.8 Power:  5.0  PI:  7.2 Alarms: none Events:  none Fixed speed:  9000 Low speed limit: 8400  Drive Line:  C/D/I. Being maintained daily. Nurse plans on demonstrating dressing change to family today if available.   Labs:  LDH trend:  255 > 281 > 256 > 263 > 273  INR trend: 1.45 > 1.48 > 1.42 > 1.38 > 1.85 > 2.53  Hgb:  9.5 > 8.4 > 8.2 > 8.0 > 7.8 > 9.0   Antithrombotic Management:   INR goal: 2.0 - 2.5 Warfarin - started 08/31/15 ASA 325 mg - started 09/02/15  Infusions: Milrinone - 0.3  Amiodarone - stopped PO amio; gtt started today for frequent PVCs and NSVT Dopamine -  Started for renal perfusion: 2.5  Epi - stopped 09/02/15 Levo - stopped 09/01/15  OR Blood Products:  4 units FFP 2 unit plts  ICU Blood Products:  09/02/15 - 1 unit PCs  Ventilator: Extubated 08/31/15 to 5L [place] and 2ppm NO  NO - off 09/01/15  Stool: 09/02/15 (3 loose stools)  Plan/Recommendations:  1.  Confused overnight; nurse reports patient was "pulling on driveline". Sitter at bedside. 2.  Unable to participate in rehab today due to confusion. 3.  Consult yesterday with Ottie Glazier for consideration to IP rehab admission.  4.  Nurse to demonstrate dressing change to family later today.

## 2015-09-04 NOTE — Progress Notes (Signed)
Patient ID: [name], male   DOB: [birth year], 75 y.o.   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 Normal cors. EF 10% with low cardiac output.   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    Remains on Dopamine 2.5 mcg, milrinone 0.3, and amio.  MAPs in 90s   Co-ox 65% CVP 6-7  Remains confused.    Flow: 5.5 L/min  Speed: 9000 PI 7.1  PP 5.3  No PI events.   Objective:   Vital Signs: MAPs 80s   Temp:  [97.5 F (36.4 C)-99.2 F (37.3 C)] 98.6 F (37 C) (06/28 0700) Pulse Rate:  [44-103] 97 (06/28 0700) Resp:  [15-26] 19 (06/28 0700) BP: (73-128)/(59-97) 101/83 mmHg (06/28 0700) SpO2:  [92 %-100 %] 100 % (06/28 0700) Weight:  [180 lb 8.9 oz (81.9 kg)] 180 lb 8.9 oz (81.9 kg) (06/28 0500) Last BM Date: 09/02/15  Weight change: Filed Weights   09/02/15 0500 09/03/15 0500 09/04/15 0500  Weight: 187 lb 2.7 oz (84.9 kg) 184 lb 3.2 oz (83.553 kg) 180 lb 8.9 oz (81.9 kg)    Intake/Output:   Intake/Output Summary (Last 24 hours) at 09/04/15 0852 Last data filed at 09/04/15 0600  Gross per 24 hour  Intake 2256.73 ml  Output   4695 ml  Net -2438.27 ml     Physical Exam: CVP  6-7  General: In bed. Confused. Wife and sitter at bedside HEENT: normal  Neck: supple. RIJ TLC Cor:  LVAD hum.  Lungs: Decreased breath sounds at bases bilaterally.  Abdomen: soft, mildly distended. No hepatosplenomegaly. Hypoactive bowel sounds. Driveline site ok. Securement device in place.  Extremities: no cyanosis, clubbing, rash, 2+ edema  Neuro: Knows name and year but otherwise confused. Non focal   Telemetry:  A-V sequential pacing frequent PVCs 80-90s  Labs: Basic Metabolic Panel:  Recent Labs Lab 08/31/15 0400  08/31/15 1644 09/01/15 0344  09/02/15 0404 09/02/15 1659 09/03/15 0345 09/03/15 1641 09/04/15 0148  NA 134*  < >  --  130*  < > 130* 132* 133* 137 136  K 4.6  < >   --  4.5  < > 3.8 3.8 3.4* 3.9 3.6  CL 100*  < >  --  97*  < > 95* 91* 95* 91* 98*  CO2 27  --   --  26  --  29  --  30  --  29  GLUCOSE 96  < >  --  134*  < > 132* 145* 112* 157* 102*  BUN 18  < >  --  19  < > 19 19 21* 20 17  CREATININE 0.86  < > 0.86 0.85  < > 0.85 0.70 0.89 0.70 0.84  CALCIUM 8.3*  --   --  8.7*  --  8.6*  --  8.8*  --  8.6*  MG 2.5*  --  2.5* 2.5*  --  2.3  --  2.3  --  2.1  PHOS 3.6  --   --   --   --   --   --   --   --   --   < > = values in this interval not displayed.  Liver Function Tests:  Recent Labs Lab 08/31/15 0400 09/01/15 0344 09/02/15 0404 09/03/15 0345 09/04/15 0148  AST 74* 69* 47* 36 33  ALT 26 30 31  30 27  ALKPHOS 39 40 40 53 52  BILITOT 3.2* 3.5* 2.2* 1.5* 0.9  PROT 5.4* 5.8* 5.2* 5.3* 5.3*  ALBUMIN 3.2* 3.5 3.0* 2.9* 3.0*   No results for input(s): LIPASE, AMYLASE in the last 168 hours. No results for input(s): AMMONIA in the last 168 hours.  CBC:  Recent Labs Lab 08/31/15 0400  08/31/15 1644 09/01/15 0344  09/02/15 0404 09/02/15 1659 09/03/15 0345 09/03/15 1641 09/04/15 0148  WBC 7.5  --  7.8 9.8  --  7.8  --  5.9  --  5.7  NEUTROABS 6.5  --   --  8.6*  --  6.7  --  4.6  --  4.1  HGB 8.2*  < > 8.0* 8.0*  < > 7.8* 9.2* 9.0* 9.9* 9.6*  HCT 26.3*  < > 26.0* 26.2*  < > 25.8* 27.0* 29.1* 29.0* 31.1*  MCV 83.8  --  85.5 84.8  --  84.9  --  84.6  --  85.2  PLT 110*  --  119* 114*  --  115*  --  113*  --  132*  < > = values in this interval not displayed.  Cardiac Enzymes: No results for input(s): CKTOTAL, CKMB, CKMBINDEX, TROPONINI in the last 168 hours.  BNP: BNP (last 3 results)  Recent Labs  05/13/15 2248 08/21/15 1345 08/31/15 0400  BNP 1235.6* 2132.9* 1387.0*    ProBNP (last 3 results) No results for input(s): PROBNP in the last 8760 hours.    Other results:  Imaging: Dg Chest Port 1 View  09/04/2015  CLINICAL DATA:  Left ventricular assist device EXAM: PORTABLE CHEST 1 VIEW COMPARISON:  09/03/2015  FINDINGS: Left ventricular assist device partially imaged and in satisfactory position. AICD in good position Right arm PICC tip in the lower SVC unchanged in position. Right chest tube is been removed.  No pneumothorax Congestive heart failure with bilateral edema unchanged. Bibasilar atelectasis left greater than right. Bilateral pleural effusions left greater than right unchanged. IMPRESSION: Persistent congestive heart failure and edema unchanged. Bibasilar atelectasis and effusion unchanged. Electronically Signed   By: Marlan Palau M.D.   On: 09/04/2015 07:45   Dg Chest Port 1 View  09/03/2015  CLINICAL DATA:  LVAD. EXAM: PORTABLE CHEST 1 VIEW COMPARISON:  09/02/2015. FINDINGS: No change in support tubes and LVAD apparatus. Dual lead pacer is stable. Moderate edema without definite consolidation. No pneumothorax. IMPRESSION: Stable chest. Electronically Signed   By: Elsie Stain M.D.   On: 09/03/2015 07:38   Dg Chest Port 1 View  09/02/2015  CLINICAL DATA:  Confirm line placement.  New PICC. These results will be called to the ordering clinician or representative by the Radiology Department at the imaging location. EXAM: PORTABLE CHEST 1 VIEW COMPARISON:  Earlier today FINDINGS: New right upper extremity PICC with tip at the upper cavoatrial junction. No interval displacement of biventricular ICD/pacer leads from the left. LVAD present. Right IJ central lines, tip stable from prior, most distal at the upper cavoatrial junction. Atelectasis and layering pleural effusions. Left chest drain no longer seen. No pneumothorax. Stable cardiopericardial enlargement. IMPRESSION: 1. New right upper extremity PICC with tip at the upper cavoatrial junction. 2. Stable layering effusions and atelectasis. 3. Left chest tube no longer seen.  No pneumothorax Electronically Signed   By: Marnee Spring M.D.   On: 09/02/2015 11:39     Medications:     Scheduled Medications: . acetaminophen  1,000 mg Oral Q6H   Or   .  acetaminophen (TYLENOL) oral liquid 160 mg/5 mL  1,000 mg Per Tube Q6H  . antiseptic oral rinse  7 mL Mouth Rinse q12n4p  . aspirin EC  81 mg Oral Daily  . bisacodyl  10 mg Oral Daily   Or  . bisacodyl  10 mg Rectal Daily  . chlorhexidine  15 mL Mouth Rinse BID  . feeding supplement (ENSURE ENLIVE)  237 mL Oral TID BM  . ferrous fumarate-b12-vitamic C-folic acid  1 capsule Oral TID PC  . furosemide  20 mg Intravenous BID  . metoCLOPramide (REGLAN) injection  10 mg Intravenous Q6H  . mirtazapine  15 mg Oral QHS  . OLANZapine  2.5 mg Oral QHS  . pantoprazole  40 mg Oral Daily  . sodium chloride flush  10-40 mL Intracatheter Q12H  . sodium chloride flush  3 mL Intravenous Q12H  . Warfarin - Physician Dosing Inpatient   Does not apply q1800    Infusions: . sodium chloride Stopped (08/31/15 1700)  . sodium chloride 250 mL (08/31/15 0540)  . sodium chloride 10 mL/hr at 08/30/15 1500  . amiodarone 30 mg/hr (09/03/15 2000)  . DOPamine 2.5 mcg/kg/min (09/03/15 0800)  . heparin 900 Units/hr (09/03/15 0800)  . lactated ringers Stopped (08/30/15 1500)  . lactated ringers Stopped (09/02/15 1200)  . milrinone 0.3 mcg/kg/min (09/04/15 0641)    PRN Medications: sodium chloride, docusate sodium, fentaNYL (SUBLIMAZE) injection, ondansetron (ZOFRAN) IV, polyvinyl alcohol, sodium chloride flush, sodium chloride flush, traMADol   Assessment:   1. 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 EF 15%. Cath 8/14 with normal cors. 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  2. Frequent PVCs 3. Severe Malnutrition- Prealbumin 14.7 on 6/20 4. Acute post-op delirium/sundowning  Plan/Discussion:    POD 5 s/p HM II LVAD.  Continues to struggle a bit post-op with confusion. Continue dopamine and milrinone. Continue 20 mg IV lasix twice a day. CXR still wet.   Ongoing sundowning/acute delirium. On zyprexa. Has sitter.   Co-ox 65% ok. Continue milrinone and dopamine.   Continue IV  amio.   VAD parameters stable. Continue heparin/warfarin.   PT following. CIR potential.   Length of Stay: 14 Amy Clegg NP-C   09/04/2015, 8:52 AM  Patient seen and examined with Tonye Becket, NP. We discussed all aspects of the encounter. I agree with the assessment and plan as stated above.   Still struggling with sundowning and delirium. On Zyprexa without much effect. Will discuss with Dr. Phillips Odor for suggestions. Will try to get back on day/night schedule.   Remains volume overloaded. Continue dopamine and IV lasix. Co-ox ok.   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 Continue amio.   VAD parameters stable.   Bailley Guilford,MD 9:21 AM

## 2015-09-04 NOTE — Progress Notes (Signed)
Admitted 08/21/15 due to acute on chronic systolic heart failure; severe NICM.  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 Maren Beach as Destination Therapy VAD.  Vital signs: HR:  99 Doppler MAP:  90 Automated BP:  114/81 (91) O2 Sat: 97% on 2 L/[place] Wt in lbs:  158 > 199 > 188 > 187 > 184 > 180 lbs  CVP:  6 - 7  LVAD interrogation reveals:  Speed:  9000 Flow: 4.8 Power:  5.0  PI:  7.2 Alarms: none Events:  none Fixed speed:  9000 Low speed limit: 8400  Drive Line:  C/D/I. Being maintained daily with gauze dressing and aquacel silver strip.   Labs:  LDH trend:  255 > 281 > 256 > 263 > 273   INR trend: 1.45 > 1.48 > 1.42 > 1.38 > 1.85 > 2.53  Hgb:  9.5 > 8.4 > 8.2 > 8.0 > 7.8 > 9.0 > 9.6  Antithrombotic Management:   INR goal: 2.0 - 2.5 Warfarin - started 08/31/15; reached therapeutic range 09/04/15 ASA 325 mg - started 09/02/15; dropped to 81 mg after INR reached therapeutic range Heparin gtt - started 09/02/15; stopped 09/03/15   Infusions: Milrinone - 0.3  Amiodarone - 30 Dopamine -  2.5 Epi - stopped 09/02/15 Levo - stopped 09/01/15  OR Blood Products:  4 units FFP 2 unit plts  ICU Blood Products:  09/02/15 - 1 unit PCs  Ventilator: Extubated 08/31/15 to 5L [place] and 2ppm NO  NO - off 09/01/15  Stool: 09/02/15 (3 loose stools)  Plan/Recommendations:  1.  Remains confused; sitter and wife at bedside. 2.  Walked 350 feet with rehab today.  3.  F/U visit with Ottie Glazier today; recommends placing IP Rehab Consult a few days prior to discharge so MD         can assess for admission.   Hessie Diener, RN VAD Coordinator [phone] 24/7 VAD pager: [phone]

## 2015-09-04 NOTE — Progress Notes (Signed)
CSW met at bedside with patient and family. Patient continues with confusion but was able to ambulate around the unit today. Family very pleased with his ambulation and slight improvement from yesterday. Wife spoke at length about long night and continued confusion. Family all very supportive of each other and patient. CSW offered support and listening as they shared concerns and frustrations about patient's confusion and recovery process. Wife appears to be coping with stress and encouraged to get some rest. CSW will continue to follow for supportive needs throughout recovery. Raquel Sarna, LCSW [phone]

## 2015-09-04 NOTE — Care Management Important Message (Signed)
Important Message  Patient Details  Name: [name] MRN: [id number] Date of Birth: [birth year]   Medicare Important Message Given:  Yes    [name] 09/04/2015, 9:58 AM

## 2015-09-04 NOTE — Progress Notes (Addendum)
Physical Therapy Treatment Patient Details Name: [name] MRN: [id number] DOB: 06-Mar-1941 Today's Date: 09/04/2015    History of Present Illness Pt is a 75 y/o M who was [facility] on 08/21/2015 [facility] with acute on chronic systolic heart failure. He is undergoing preparation this week for an LVAD placement (scheduled for 08/30/15). Pt's PMH inlcudes frequent PVCs, nonischemic cardiomyopathy, automatic ICD. Pt had LVAD placed 6/23.    PT Comments    Pt remains confused but using Stedy simplified chair to bed transfer and pt able to assist much more with transfer. Feel pt could benefit from CIR.  Follow Up Recommendations  CIR     Equipment Recommendations  None recommended by PT    Recommendations for Other Services       Precautions / Restrictions Precautions Precautions: Sternal Precaution Comments: h/o frequent PVCs; provided pt w/ and reviewed sternal precaution handout Restrictions Weight Bearing Restrictions: Yes Other Position/Activity Restrictions: sternal precautions    Mobility  Bed Mobility Overal bed mobility: Needs Assistance Bed Mobility: Sit to Sidelying         Sit to sidelying: +2 for physical assistance;Max assist General bed mobility comments: Assist to lower trunk and to bring legs up into bed.  Transfers Overall transfer level: Needs assistance Equipment used: Ambulation equipment used Transfers: Sit to/from [employer] Sit to Stand: +2 physical assistance;Mod assist Stand pivot transfers: +2 physical assistance;Mod assist       General transfer comment: Assist to bring hips up  using bed pad under hips. Used Stedy for pivot to bed.  Ambulation/Gait                 Stairs            Wheelchair Mobility    Modified Rankin (Stroke Patients Only)       Balance Overall balance assessment: Needs assistance Sitting-balance support: No upper extremity supported;Feet  supported Sitting balance-Leahy Scale: Poor Sitting balance - Comments: Min A for static sitting Postural control: Posterior lean Standing balance support: Bilateral upper extremity supported Standing balance-Leahy Scale: Poor Standing balance comment: Stood with Stedy x 60 sec with verbal/tactile cues to look up and stand more erect. +2 min A for static standing after initial mod A due to posterior lean.                    Cognition Arousal/Alertness: Awake/alert Behavior During Therapy: Restless;Flat affect;Impulsive Overall Cognitive Status: Impaired/Different from baseline Area of Impairment: Orientation;Attention;Memory;Following commands;Safety/judgement;Awareness;Problem solving Orientation Level: Disoriented to;Place;Time;Situation Current Attention Level: Sustained Memory: Decreased recall of precautions;Decreased short-term memory Following Commands: Follows one step commands inconsistently Safety/Judgement: Decreased awareness of safety;Decreased awareness of deficits Awareness: Intellectual Problem Solving: Slow processing;Decreased initiation;Difficulty sequencing;Requires verbal cues;Requires tactile cues General Comments: Pt appears to be hallucinating    Exercises      General Comments        Pertinent Vitals/Pain Pain Assessment: Faces Faces Pain Scale: Hurts little more Pain Location: general discomfort Pain Descriptors / Indicators: Grimacing Pain Intervention(s): Limited activity within patient's tolerance    Home Living                      Prior Function            PT Goals (current goals can now be found in the care plan section) Progress towards PT goals: Progressing toward goals    Frequency  Min 3X/week    PT Plan Discharge plan needs  to be updated    Co-evaluation             End of Session Equipment Utilized During Treatment: Oxygen Activity Tolerance: Patient tolerated treatment well Patient left: with call  bell/phone within reach;with nursing/sitter in room;in bed     Time: 0815-0830 PT Time Calculation (min) (ACUTE ONLY): 15 min  Charges:  $Therapeutic Activity: 8-22 mins                    G Codes:      Artrice Kraker 2015-09-07, 11:35 AM [facility] PT [phone]
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
## 2015-09-04 NOTE — Progress Notes (Signed)
Physical Therapy Treatment Patient Details Name: [name] MRN: [id number] DOB: 1941-01-15 Today's Date: 09/04/2015    History of Present Illness Pt is a 75 y/o M who was [facility] on 08/21/2015 [facility] with acute on chronic systolic heart failure. He is undergoing preparation this week for an LVAD placement (scheduled for 08/30/15). Pt's PMH inlcudes frequent PVCs, nonischemic cardiomyopathy, automatic ICD. Pt had LVAD placed 6/23.    PT Comments    Pt able to amb for the first time and able to amb 350'. With pt's confusion had to simplify task having him walk straight from chair pushing w/c and not have him try to take any pivotal steps. Wife present for some of amb.  Follow Up Recommendations  CIR     Equipment Recommendations  None recommended by PT    Recommendations for Other Services       Precautions / Restrictions Precautions Precautions: Sternal Precaution Comments: h/o frequent PVCs; provided pt w/ and reviewed sternal precaution handout Restrictions Weight Bearing Restrictions: Yes Other Position/Activity Restrictions: sternal precautions    Mobility  Bed Mobility Overal bed mobility: Needs Assistance Bed Mobility: Supine to Sit     Supine to sit: +2 for physical assistance;Mod assist   Sit to sidelying: +2 for physical assistance;Max assist General bed mobility comments: Assist to elevate trunk into sitting and bring hips to edge of bed  Transfers Overall transfer level: Needs assistance Equipment used: Ambulation equipment used;Pushed w/c Transfers: Sit to/from Stand;Stand Pivot Transfers Sit to Stand: +2 physical assistance;Mod assist Stand pivot transfers: +2 physical assistance;Mod assist       General transfer comment: From bed stood with +2 min A and allowed pt to place hands on Stedy cross bar but monitored amount of pulling pt did. Used Stedy for pivot to [employer] Stood from recliner with +2 mod A with w/c in front  of pt for ambulation. Had to block pt's feet to keep from sliding. Initially some posterior leaning. Required +2 mod A for stand to sit due to pt's reluctance to sit due to confusion. Used downward pressure on hips and shoulders to assist pt to sit.  Ambulation/Gait Ambulation/Gait assistance: +2 physical assistance;Min assist Psychiatrist followed with recliner but we didn't need recliner) Ambulation Distance (Feet): 350 Feet Assistive device:  (pushed w/c) Gait Pattern/deviations: Step-through pattern;Decreased stride length;Drifts right/left;Trunk flexed Gait velocity: decr Gait velocity interpretation: Below normal speed for age/gender General Gait Details: Initial slight posterior and rt lateral lean which improved after 3-4 steps.   Stairs            Wheelchair Mobility    Modified Rankin (Stroke Patients Only)       Balance Overall balance assessment: Needs assistance Sitting-balance support: Bilateral upper extremity supported;Feet supported Sitting balance-Leahy Scale: Poor Sitting balance - Comments: Min A for static sitting initially and then supervision but with UE support Postural control: Posterior lean Standing balance support: Bilateral upper extremity supported Standing balance-Leahy Scale: Poor Standing balance comment: UE support and min A for static standing                    Cognition Arousal/Alertness: Awake/alert Behavior During Therapy: Flat affect Overall Cognitive Status: Impaired/Different from baseline Area of Impairment: Orientation;Attention;Memory;Following commands;Safety/judgement;Awareness;Problem solving Orientation Level: Disoriented to;Time;Place;Situation Current Attention Level: Focused Memory: Decreased short-term memory;Decreased recall of precautions Following Commands: Follows one step commands inconsistently Safety/Judgement: Decreased awareness of safety;Decreased awareness of deficits Awareness: Intellectual Problem  Solving: Slow processing;Difficulty sequencing;Requires verbal cues;Requires  tactile cues;Decreased initiation General Comments: Pt appears to be hallucinating    Exercises      General Comments        Pertinent Vitals/Pain Pain Assessment: Faces Faces Pain Scale: No hurt Pain Location: general discomfort Pain Descriptors / Indicators: Grimacing Pain Intervention(s): Limited activity within patient's tolerance    Home Living                      Prior Function            PT Goals (current goals can now be found in the care plan section) Progress towards PT goals: Progressing toward goals    Frequency  Min 3X/week    PT Plan Discharge plan needs to be updated    Co-evaluation PT/OT/SLP Co-Evaluation/Treatment: Yes Reason for Co-Treatment: Complexity of the patient's impairments (multi-system involvement);For patient/therapist safety PT goals addressed during session: Mobility/safety with mobility       End of Session Equipment Utilized During Treatment: Oxygen Activity Tolerance: Patient tolerated treatment well Patient left: with call bell/phone within reach;with nursing/sitter in room;in chair;with family/visitor present (Pt in recliner at window with sitter and family present)     Time: 1013-1050 PT Time Calculation (min) (ACUTE ONLY): 37 min  Charges:  $Gait Training: 8-22 mins $Therapeutic Activity: 8-22 mins                    G Codes:      Tanuj Mullens 2015-09-16, 11:49 AM Skip Mayer PT [phone]

## 2015-09-04 NOTE — Progress Notes (Signed)
CT surgery p.m. Rounds  Patient had excellent day and that he will ambulated 300 feet He is now delirious and trying to get out of bed We'll give 1 dose of IV Haldol now. VAD pump parameters are satisfactory Oral intake is marginal but would not place feeding tube while patient is so confused

## 2015-09-05 ENCOUNTER — Inpatient Hospital Stay (HOSPITAL_COMMUNITY): Payer: Medicare Other

## 2015-09-05 DIAGNOSIS — R410 Disorientation, unspecified: Secondary | ICD-10-CM | POA: Insufficient documentation

## 2015-09-05 LAB — POCT I-STAT, CHEM 8
BUN: 15 mg/dL (ref 6–20)
CALCIUM ION: 1.19 mmol/L (ref 1.12–1.23)
CREATININE: 0.7 mg/dL (ref 0.61–1.24)
Chloride: 98 mmol/L — ABNORMAL LOW (ref 101–111)
GLUCOSE: 149 mg/dL — AB (ref 65–99)
HEMATOCRIT: 28 % — AB (ref 39.0–52.0)
HEMOGLOBIN: 9.5 g/dL — AB (ref 13.0–17.0)
Potassium: 5.5 mmol/L — ABNORMAL HIGH (ref 3.5–5.1)
Sodium: 135 mmol/L (ref 135–145)
TCO2: 28 mmol/L (ref 0–100)

## 2015-09-05 LAB — AMMONIA: Ammonia: 16 umol/L (ref 9–35)

## 2015-09-05 LAB — CARBOXYHEMOGLOBIN
Carboxyhemoglobin: 1.8 % — ABNORMAL HIGH (ref 0.5–1.5)
Methemoglobin: 0.8 % (ref 0.0–1.5)
O2 Saturation: 77.8 %
Total hemoglobin: 9.2 g/dL — ABNORMAL LOW (ref 13.5–18.0)

## 2015-09-05 LAB — COMPREHENSIVE METABOLIC PANEL
ALT: 25 U/L (ref 17–63)
AST: 30 U/L (ref 15–41)
Albumin: 2.9 g/dL — ABNORMAL LOW (ref 3.5–5.0)
Alkaline Phosphatase: 49 U/L (ref 38–126)
Anion gap: 9 (ref 5–15)
BUN: 16 mg/dL (ref 6–20)
CO2: 30 mmol/L (ref 22–32)
Calcium: 8.9 mg/dL (ref 8.9–10.3)
Chloride: 97 mmol/L — ABNORMAL LOW (ref 101–111)
Creatinine, Ser: 0.74 mg/dL (ref 0.61–1.24)
GFR calc Af Amer: >60 mL/min (ref >60)
GFR calc non Af Amer: >60 mL/min (ref >60)
Glucose, Bld: 111 mg/dL — ABNORMAL HIGH (ref 65–99)
Potassium: 3.7 mmol/L (ref 3.5–5.1)
Sodium: 136 mmol/L (ref 135–145)
Total Bilirubin: 1.1 mg/dL (ref 0.3–1.2)
Total Protein: 5.7 g/dL — ABNORMAL LOW (ref 6.5–8.1)

## 2015-09-05 LAB — LACTATE DEHYDROGENASE: LDH: 253 U/L — ABNORMAL HIGH (ref 98–192)

## 2015-09-05 LAB — URINALYSIS W MICROSCOPIC (NOT AT ARMC)
BILIRUBIN URINE: NEGATIVE
Glucose, UA: NEGATIVE mg/dL
Hgb urine dipstick: NEGATIVE
Ketones, ur: 15 mg/dL — AB
NITRITE: NEGATIVE
PH: 8 (ref 5.0–8.0)
Protein, ur: NEGATIVE mg/dL
RBC / HPF: NONE SEEN RBC/hpf (ref 0–5)
Specific Gravity, Urine: 1.016 (ref 1.005–1.030)

## 2015-09-05 LAB — CBC WITH DIFFERENTIAL/PLATELET
Basophils Absolute: 0 10*3/uL (ref 0.0–0.1)
Basophils Relative: 0 %
Eosinophils Absolute: 0.3 10*3/uL (ref 0.0–0.7)
Eosinophils Relative: 4 %
HCT: 29.1 % — ABNORMAL LOW (ref 39.0–52.0)
Hemoglobin: 9 g/dL — ABNORMAL LOW (ref 13.0–17.0)
Lymphocytes Relative: 12 %
Lymphs Abs: 0.7 10*3/uL (ref 0.7–4.0)
MCH: 26.6 pg (ref 26.0–34.0)
MCHC: 30.9 g/dL (ref 30.0–36.0)
MCV: 86.1 fL (ref 78.0–100.0)
Monocytes Absolute: 0.6 10*3/uL (ref 0.1–1.0)
Monocytes Relative: 10 %
Neutro Abs: 4.3 10*3/uL (ref 1.7–7.7)
Neutrophils Relative %: 74 %
Platelets: 145 10*3/uL — ABNORMAL LOW (ref 150–400)
RBC: 3.38 MIL/uL — ABNORMAL LOW (ref 4.22–5.81)
RDW: 16.9 % — ABNORMAL HIGH (ref 11.5–15.5)
WBC: 5.8 10*3/uL (ref 4.0–10.5)

## 2015-09-05 LAB — GLUCOSE, CAPILLARY
GLUCOSE-CAPILLARY: 91 mg/dL (ref 65–99)
GLUCOSE-CAPILLARY: 118 mg/dL — AB (ref 65–99)
GLUCOSE-CAPILLARY: 172 mg/dL — AB (ref 65–99)
GLUCOSE-CAPILLARY: 146 mg/dL — AB (ref 65–99)
GLUCOSE-CAPILLARY: 136 mg/dL — AB (ref 65–99)
Glucose-Capillary: 114 mg/dL — ABNORMAL HIGH (ref 65–99)
Glucose-Capillary: 176 mg/dL — ABNORMAL HIGH (ref 65–99)

## 2015-09-05 LAB — PROTIME-INR
INR: 2.62 — ABNORMAL HIGH (ref 0.00–1.49)
Prothrombin Time: 27.6 seconds — ABNORMAL HIGH (ref 11.6–15.2)

## 2015-09-05 LAB — MAGNESIUM: Magnesium: 1.8 mg/dL (ref 1.7–2.4)

## 2015-09-05 MED ORDER — POTASSIUM CHLORIDE 10 MEQ/50ML IV SOLN
10.0000 meq | INTRAVENOUS | Status: AC | PRN
  Administered 2015-09-05 – 2015-09-06 (×4): 10 meq via INTRAVENOUS
  Filled 2015-09-05 (×3): qty 50

## 2015-09-05 MED ORDER — OLANZAPINE 5 MG PO TABS
5.0000 mg | ORAL_TABLET | Freq: Every day | ORAL | Status: DC
  Administered 2015-09-05 – 2015-09-18 (×14): 5 mg via ORAL
  Filled 2015-09-05 (×16): qty 1

## 2015-09-05 MED ORDER — WARFARIN SODIUM 2.5 MG PO TABS
2.5000 mg | ORAL_TABLET | Freq: Every day | ORAL | Status: AC
  Administered 2015-09-05: 2.5 mg via ORAL
  Filled 2015-09-05: qty 1

## 2015-09-05 MED ORDER — MILRINONE LACTATE IN DEXTROSE 20-5 MG/100ML-% IV SOLN
0.2500 ug/kg/min | INTRAVENOUS | Status: DC
  Administered 2015-09-06 – 2015-09-09 (×5): 0.25 ug/kg/min via INTRAVENOUS
  Filled 2015-09-05 (×6): qty 100

## 2015-09-05 MED ORDER — AMIODARONE HCL 200 MG PO TABS
400.0000 mg | ORAL_TABLET | Freq: Two times a day (BID) | ORAL | Status: DC
  Administered 2015-09-05 – 2015-09-08 (×7): 400 mg via ORAL
  Filled 2015-09-05 (×8): qty 2

## 2015-09-05 MED ORDER — FUROSEMIDE 10 MG/ML IJ SOLN
20.0000 mg | Freq: Every day | INTRAMUSCULAR | Status: DC
  Administered 2015-09-06: 20 mg via INTRAVENOUS
  Filled 2015-09-05: qty 2

## 2015-09-05 MED ORDER — MAGNESIUM SULFATE 2 GM/50ML IV SOLN
2.0000 g | Freq: Once | INTRAVENOUS | Status: AC
  Administered 2015-09-05: 2 g via INTRAVENOUS
  Filled 2015-09-05: qty 50

## 2015-09-05 MED ORDER — POTASSIUM CHLORIDE 10 MEQ/50ML IV SOLN
10.0000 meq | INTRAVENOUS | Status: AC
  Administered 2015-09-05 (×2): 10 meq via INTRAVENOUS
  Filled 2015-09-05 (×2): qty 50

## 2015-09-05 MED ORDER — POLYVINYL ALCOHOL 1.4 % OP SOLN
1.0000 [drp] | Freq: Four times a day (QID) | OPHTHALMIC | Status: DC | PRN
  Filled 2015-09-05: qty 15

## 2015-09-05 MED ORDER — BIOTENE DRY MOUTH MT LIQD: 15.0000 mL | OROMUCOSAL | Status: DC | PRN

## 2015-09-05 MED ORDER — ZOLPIDEM TARTRATE 5 MG PO TABS
5.0000 mg | ORAL_TABLET | Freq: Every day | ORAL | Status: DC
  Administered 2015-09-05 – 2015-09-11 (×7): 5 mg via ORAL
  Filled 2015-09-05 (×7): qty 1

## 2015-09-05 MED ORDER — LACTULOSE 10 GM/15ML PO SOLN
30.0000 g | Freq: Once | ORAL | Status: AC
Start: 2015-09-05 — End: 2015-09-05
  Administered 2015-09-05: 30 g via ORAL
  Filled 2015-09-05: qty 45

## 2015-09-05 MED ORDER — POTASSIUM CHLORIDE 10 MEQ/50ML IV SOLN
INTRAVENOUS | Status: AC
  Administered 2015-09-05: 10 meq via INTRAVENOUS
  Filled 2015-09-05: qty 150

## 2015-09-05 NOTE — Progress Notes (Signed)
CSW met with wife at bedside. Patient continues with confusion as reported by wife. Patient was resting soundly during visit. Wife spoke at length about past few days and patient's confusion. Wife concerned about continued delirium. Patient's family very supportive and multiple members visit daily and provided wife support as well. CSW offered supportive interventions and listening. CSW will continue to follow for support and any other needs as indicated. Raquel Sarna, LCSW [phone]
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
## 2015-09-05 NOTE — Progress Notes (Signed)
PT Cancellation Note  Patient Details Name: [name] MRN: [id number] DOB: [birth year]   Cancelled Treatment:    Reason Eval/Treat Not Completed: Lethargy limiting ability to participate. Unable to arouse pt adequately.    [name] 09/05/2015, 1:27 PM [employer] PT [phone]

## 2015-09-05 NOTE — Progress Notes (Signed)
PT Cancellation Note  Patient Details Name: [name] MRN: [id number] DOB: [birth year]   Cancelled Treatment:    Reason Eval/Treat Not Completed: Lethargy limiting ability to participate. Unable to arouse pt adequately. Will try again later today.   Kendrah Lovern 09/05/2015, 9:58 AM Skip Mayer PT [phone]

## 2015-09-05 NOTE — Progress Notes (Signed)
Patient ID: [name], male   DOB: 08-31-1940, 75 y.o.   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 Normal cors. EF 10% with low cardiac output.   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    Off Dopamine mcg, milrinone 0.3, and amio.  MAPs in 90s   Co-ox 78% CVP 6-7  Remains confused.    Flow: 5.5 L/min  Speed: 9000 PI 6.5  PP 5.5  No PI events.   Objective:   Vital Signs: MAPs 80s   Temp:  [97.8 F (36.6 C)-99.4 F (37.4 C)] 98.8 F (37.1 C) (06/29 0840) Pulse Rate:  [40-104] 40 (06/29 0800) Resp:  [17-28] 20 (06/29 0900) BP: (72-133)/(42-88) 88/64 mmHg (06/29 0900) SpO2:  [76 %-100 %] 98 % (06/29 0900) Weight:  [176 lb 12.9 oz (80.2 kg)] 176 lb 12.9 oz (80.2 kg) (06/29 0500) Last BM Date: 09/02/15  Weight change: Filed Weights   09/03/15 0500 09/04/15 0500 09/05/15 0500  Weight: 184 lb 3.2 oz (83.553 kg) 180 lb 8.9 oz (81.9 kg) 176 lb 12.9 oz (80.2 kg)    Intake/Output:   Intake/Output Summary (Last 24 hours) at 09/05/15 0947 Last data filed at 09/05/15 0900  Gross per 24 hour  Intake 1931.76 ml  Output   4300 ml  Net -2368.24 ml     Physical Exam: CVP   General: In bed. Confused. Wife and sitter at bedside HEENT: normal  Neck: supple. RIJ TLC Cor:  LVAD hum.  Lungs: Decreased breath sounds at bases bilaterally.  Abdomen: soft, mildly distended. No hepatosplenomegaly. Hypoactive bowel sounds. Driveline site ok. Securement device in place.  Extremities: no cyanosis, clubbing, rash, 2+ edema  Neuro: Knows name and year but otherwise confused. Non focal   Telemetry:  A-V sequential pacing frequent PVCs 80-90s  Labs: Basic Metabolic Panel:  Recent Labs Lab 08/31/15 0400  09/01/15 0344  09/02/15 0404  09/03/15 0345 09/03/15 1641 09/04/15 0148 09/04/15 1609 09/05/15 0447  NA 134*  < > 130*  < > 130*  < > 133* 137 136 133* 136  K 4.6  < > 4.5  < >  3.8  < > 3.4* 3.9 3.6 4.3 3.7  CL 100*  < > 97*  < > 95*  < > 95* 91* 98* 91* 97*  CO2 27  --  26  --  29  --  30  --  29  --  30  GLUCOSE 96  < > 134*  < > 132*  < > 112* 157* 102* 187* 111*  BUN 18  < > 19  < > 19  < > 21* 20 17 21* 16  CREATININE 0.86  < > 0.85  < > 0.85  < > 0.89 0.70 0.84 0.70 0.74  CALCIUM 8.3*  --  8.7*  --  8.6*  --  8.8*  --  8.6*  --  8.9  MG 2.5*  < > 2.5*  --  2.3  --  2.3  --  2.1  --  1.8  PHOS 3.6  --   --   --   --   --   --   --   --   --   --   < > = values in this interval not displayed.  Liver Function Tests:  Recent Labs Lab 09/01/15 0344 09/02/15 0404 09/03/15 0345 09/04/15 0148  09/05/15 0447  AST 69* 47* 36 33 30  ALT 30 31 30 27 25   ALKPHOS 40 40 53 52 49  BILITOT 3.5* 2.2* 1.5* 0.9 1.1  PROT 5.8* 5.2* 5.3* 5.3* 5.7*  ALBUMIN 3.5 3.0* 2.9* 3.0* 2.9*   No results for input(s): LIPASE, AMYLASE in the last 168 hours. No results for input(s): AMMONIA in the last 168 hours.  CBC:  Recent Labs Lab 09/01/15 0344  09/02/15 0404  09/03/15 0345 09/03/15 1641 09/04/15 0148 09/04/15 1609 09/05/15 0447  WBC 9.8  --  7.8  --  5.9  --  5.7  --  5.8  NEUTROABS 8.6*  --  6.7  --  4.6  --  4.1  --  4.3  HGB 8.0*  < > 7.8*  < > 9.0* 9.9* 9.6* 18.7* 9.0*  HCT 26.2*  < > 25.8*  < > 29.1* 29.0* 31.1* 55.0* 29.1*  MCV 84.8  --  84.9  --  84.6  --  85.2  --  86.1  PLT 114*  --  115*  --  113*  --  132*  --  145*  < > = values in this interval not displayed.  Cardiac Enzymes: No results for input(s): CKTOTAL, CKMB, CKMBINDEX, TROPONINI in the last 168 hours.  BNP: BNP (last 3 results)  Recent Labs  05/13/15 2248 08/21/15 1345 08/31/15 0400  BNP 1235.6* 2132.9* 1387.0*    ProBNP (last 3 results) No results for input(s): PROBNP in the last 8760 hours.    Other results:  Imaging: Dg Chest Port 1 View  09/05/2015  CLINICAL DATA:  Left ventricular assist device EXAM: PORTABLE CHEST 1 VIEW COMPARISON:  09/04/2015 FINDINGS: Left  ventricular assist device not imaged due to positioning of the patient. Right arm PICC tip in the SVC unchanged. AICD unchanged in position. Bilateral pulmonary edema unchanged. Small effusions bilaterally unchanged. Bibasilar atelectasis has progressed due to hypoventilation. No pneumothorax IMPRESSION: Pulmonary edema unchanged. Progression of bibasilar atelectasis left greater than right. Electronically Signed   By: Marlan Palau M.D.   On: 09/05/2015 07:42   Dg Chest Port 1 View  09/04/2015  CLINICAL DATA:  Left ventricular assist device EXAM: PORTABLE CHEST 1 VIEW COMPARISON:  09/03/2015 FINDINGS: Left ventricular assist device partially imaged and in satisfactory position. AICD in good position Right arm PICC tip in the lower SVC unchanged in position. Right chest tube is been removed.  No pneumothorax Congestive heart failure with bilateral edema unchanged. Bibasilar atelectasis left greater than right. Bilateral pleural effusions left greater than right unchanged. IMPRESSION: Persistent congestive heart failure and edema unchanged. Bibasilar atelectasis and effusion unchanged. Electronically Signed   By: Marlan Palau M.D.   On: 09/04/2015 07:45     Medications:     Scheduled Medications: . antiseptic oral rinse  7 mL Mouth Rinse q12n4p  . aspirin EC  81 mg Oral Daily  . bisacodyl  10 mg Oral Daily   Or  . bisacodyl  10 mg Rectal Daily  . chlorhexidine  15 mL Mouth Rinse BID  . feeding supplement (ENSURE ENLIVE)  237 mL Oral TID BM  . ferrous fumarate-b12-vitamic C-folic acid  1 capsule Oral TID PC  . furosemide  20 mg Intravenous BID  . insulin aspart  0-20 Units Subcutaneous Q4H  . magnesium sulfate 1 - 4 g bolus IVPB  2 g Intravenous Once  . mirtazapine  15 mg Oral QHS  . OLANZapine  5 mg Oral QHS  . pantoprazole  40 mg Oral Daily  . potassium chloride  10 mEq Intravenous Q1 Hr x 2  . sodium chloride flush  10-40 mL Intracatheter Q12H  . sodium chloride flush  3 mL Intravenous  Q12H  . warfarin  2.5 mg Oral q1800  . Warfarin - Physician Dosing Inpatient   Does not apply q1800    Infusions: . sodium chloride Stopped (08/31/15 1700)  . sodium chloride 250 mL (08/31/15 0540)  . sodium chloride 10 mL/hr at 08/30/15 1500  . amiodarone 30 mg/hr (09/05/15 0900)  . lactated ringers Stopped (08/30/15 1500)  . lactated ringers Stopped (09/02/15 1200)  . milrinone 0.3 mcg/kg/min (09/05/15 0900)    PRN Medications: sodium chloride, antiseptic oral rinse, docusate sodium, haloperidol lactate, ondansetron (ZOFRAN) IV, polyvinyl alcohol, potassium chloride, sodium chloride flush, sodium chloride flush, traMADol   Assessment:   1. 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 EF 15%. Cath 8/14 with normal cors. 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  2. Frequent PVCs 3. Severe Malnutrition- Prealbumin 14.7 on 6/20 4. Acute post-op delirium/sundowning  Plan/Discussion:    POD 6 s/p HM II LVAD.  Continues to struggle a bit post-op with confusion. Off dopamine. Remains on milrinone 0.375 mcg.  Continue 20 mg IV lasix twice a day. CXR still wet.   Ongoing sundowning/acute delirium. On zyprexa. Has sitter.   Co-ox 78% ok. Continue milrinone  Stop IV amio. Start amio 400 mg po twice a day.   VAD parameters stable. Off heparin. INR 2.62. On coumadin. On 81 mg aspirin.    PT following. CIR potential.   Length of Stay: 15 Amy Clegg NP-C   09/05/2015, 9:47 AM  Patient seen and examined with Tonye Becket, NP. We discussed all aspects of the encounter. I agree with the assessment and plan as stated above.   Volume status improving. VAD parameters stable. Can decrease milrinone to 0.25. Change lasix to once a day.   Main issue remains severe sundowning and delirium. I discussed with Dr. Phillips Odor by phone. Will increase Zyprexa and do our best to restore day/night routine. Check UA. Will give laxative for constipation and make sure pain adequately controlled.    Can switch amio to po.   Brigg Cape,MD 2:55 PM

## 2015-09-05 NOTE — Progress Notes (Signed)
Physical Therapy Treatment Patient Details Name: [name] MRN: [id number] DOB: 03/23/1940 Today's Date: 09/05/2015    History of Present Illness Pt is a 75 y/o M who was admitted on 08/21/2015 from the [facility] with acute on chronic systolic heart failure. He is undergoing preparation this week for an LVAD placement (scheduled for 08/30/15). Pt's PMH inlcudes frequent PVCs, nonischemic cardiomyopathy, automatic ICD. Pt had LVAD placed 6/23.    PT Comments    Pt with poor cognition and confusion limiting progress today.   Follow Up Recommendations  CIR     Equipment Recommendations  None recommended by PT    Recommendations for Other Services       Precautions / Restrictions Precautions Precautions: Sternal Restrictions Weight Bearing Restrictions: Yes Other Position/Activity Restrictions: sternal precautions    Mobility  Bed Mobility Overal bed mobility: Needs Assistance Bed Mobility: Supine to Sit     Supine to sit: +2 for physical assistance;Total assist     General bed mobility comments: Assist for all aspects.  Transfers Overall transfer level: Needs assistance Equipment used: Ambulation equipment used;Pushed w/c Transfers: Sit to/from Stand;Stand Pivot Transfers Sit to Stand: +2 physical assistance;Total assist Stand pivot transfers: +2 physical assistance;Total assist       General transfer comment: Heavy assist to bring hips and trunk up. Pt moving rt foot onto lt side of foot plate and leaning heavily rt. Transferred bed to bsc to recliner with Stedy. Pt with heavy lt lean throughout. When turning to recliner pt with rt foot on lt side of foot plate and pt moving rt foot of Stedy onto floor.  Ambulation/Gait                 Stairs            Wheelchair Mobility    Modified Rankin (Stroke Patients Only)       Balance Overall balance assessment: Needs assistance Sitting-balance support: Bilateral upper extremity  supported Sitting balance-Leahy Scale: Zero Sitting balance - Comments: total assist Postural control: Posterior lean;Left lateral lean Standing balance support: Bilateral upper extremity supported Standing balance-Leahy Scale: Poor Standing balance comment: Stood x 30 sec with Stedy with +2 mod A and lt lateral lean                    Cognition Arousal/Alertness: Lethargic Behavior During Therapy: Flat affect Overall Cognitive Status: Impaired/Different from baseline Area of Impairment: Orientation;Attention;Memory;Following commands;Safety/judgement;Awareness;Problem solving Orientation Level: Disoriented to;Time;Place;Situation Current Attention Level: Focused Memory: Decreased short-term memory;Decreased recall of precautions Following Commands: Follows one step commands inconsistently Safety/Judgement: Decreased awareness of safety;Decreased awareness of deficits Awareness: Intellectual Problem Solving: Slow processing;Difficulty sequencing;Requires verbal cues;Requires tactile cues;Decreased initiation General Comments: Pt able to arouse but kept eyes closed throughout    Exercises      General Comments        Pertinent Vitals/Pain Pain Assessment: Faces Faces Pain Scale: No hurt    Home Living                      Prior Function            PT Goals (current goals can now be found in the care plan section) Acute Rehab PT Goals Patient Stated Goal: to be able to go to the beach again Progress towards PT goals: Not progressing toward goals - comment (lethargy and worsening cognition)    Frequency  Min 3X/week    PT Plan Current plan remains appropriate  Co-evaluation PT/OT/SLP Co-Evaluation/Treatment: Yes           End of Session Equipment Utilized During Treatment: Oxygen Activity Tolerance: Other (comment) (Limited by cognition) Patient left: with call bell/phone within reach;with nursing/sitter in room;in chair (Pt in recliner at  window with sitter and family present)     Time: 1308-6578 PT Time Calculation (min) (ACUTE ONLY): 29 min  Charges:  $Therapeutic Activity: 23-37 mins                    G Codes:      Marylou Wages 09-Sep-2015, 6:27 PM [facility] PT [phone]

## 2015-09-05 NOTE — Progress Notes (Addendum)
Dr. Prescott Gum request regarding day/night sleep schedule noted.   Will add delirium precautions.   Routine, Until discontinued starting Today at 0826 Until Specified   Precautions: Low music or white noise as desired-avoid TV unless requested by patient or family.  Precautions: Keep voices quiet in room and immediately outside of room.  Precautions: Address the patient gently (Comment: provide reorientation and reassurance.)  Precautions: Explain interventions (Comment: even if minimally responsive.)  Precautions: Minimize devices if patient is pulling on lines such as indwelling urinary catheter (Comment: telemetry and IV-address with attending if acute agitation occurs.)  Precautions: Avoid PM interruptions while patient is sleeping including labs (Comment: procedures)  Precautions: Provide adequate lighting during the day/open blinds and dim lighting in evening.  Precautions: Ensure hearing aids and eyeglasses are utilized.  Precautions: Promote early and frequent mobilization when possible.  Precautions: Avoid physical restraints including mitts.    Agree with Olanzepine.  Will increase to 5 mg qhs.  Will discuss with attending MD as well.  Dr. Phillips Odor is on vacation.  Algis Downs, [place] [facility] Pager: [phone]

## 2015-09-05 NOTE — Progress Notes (Addendum)
Daily Progress Note   [name] Name: [name]       Date: 09/05/2015 DOB: March 14, 1940  Age: 75 y.o. MRN#: [id number] Attending Physician: Kerin Perna, MD Primary Care Physician: Donzetta Sprung, MD Admit Date: 08/21/2015  Reason for Consultation/Follow-up: Non pain symptom management and Psychosocial/spiritual support   [name] Profile: 75 y.o. male with past medical history of NICM with AICD in place, end stage HF with an LVEF of 10-15%, insomnia and kidney stones, who was admitted on 08/21/2015 from the [facility] Failure Clinic with acute on chronic systolic heart failure. PMT was originally asked to see [name] in preparation for LVAD placement.  Subjective: [name] with confusion / delirium.  Complaining of back pain.  [name] tells me there are two cups of coffee in his bed and [name] asks me to get rid of them.  I spoke with [name] at bedside.  She reports that the delirium started on Monday.  [name] has been unable to sleep.   At times [name] is thrashing about completely confused and at other times [name] is calm.   She asks about bringing protein shakes from home.  Per RN the [name] had some level of delirium on 2H prior to surgery.  I was unaware of this.   Assessment:  [name] clearly with hyperactive delirium.  Now post LVAD placement.  CXR showing increasing pulmonary edema.  [name] on milrinone with good C0-OX but intermittent episodes of hypotension.  Recommendations/Plan:  Discussed with Dr. Linna Darner, PMT attending.  Evaluation for delirium  Check blood cultures, U/A  Discussed Xray with Dr. Chestine Spore of radiology   Medications reviewed.  No obvious culprits.  Hgb appears stable.  [name] mentioned back pain.  If this persists would rule out retroperitoneal bleed with  CT.   Delirium precautions added  Increased olanzepine to 5 mg qhs.  Scheduled ambien 5 mg qhs.  Haldol PRN  Addendum:  Conferred with Amy Clegg of the Heart Failure team.  Will defer work up and treatment of delirium to the primary team.  Please re-consult Palliative Medicine if we may be of any assistance.    Length of Stay: 15  Current Medications: Scheduled Meds:  . amiodarone  400 mg Oral BID  . antiseptic oral rinse  7 mL Mouth Rinse q12n4p  . aspirin EC  81  mg Oral Daily  . bisacodyl  10 mg Oral Daily   Or  . bisacodyl  10 mg Rectal Daily  . chlorhexidine  15 mL Mouth Rinse BID  . feeding supplement (ENSURE ENLIVE)  237 mL Oral TID BM  . ferrous fumarate-b12-vitamic C-folic acid  1 capsule Oral TID PC  . furosemide  20 mg Intravenous BID  . insulin aspart  0-20 Units Subcutaneous Q4H  . lactulose  30 g Oral Once  . mirtazapine  15 mg Oral QHS  . OLANZapine  5 mg Oral QHS  . pantoprazole  40 mg Oral Daily  . sodium chloride flush  10-40 mL Intracatheter Q12H  . sodium chloride flush  3 mL Intravenous Q12H  . warfarin  2.5 mg Oral q1800  . Warfarin - Physician Dosing Inpatient   Does not apply q1800  . zolpidem  5 mg Oral QHS    Continuous Infusions: . sodium chloride Stopped (08/31/15 1700)  . sodium chloride 250 mL (08/31/15 0540)  . sodium chloride 10 mL/hr at 08/30/15 1500  . lactated ringers Stopped (08/30/15 1500)  . lactated ringers Stopped (09/02/15 1200)  . milrinone 0.3 mcg/kg/min (09/05/15 1044)    PRN Meds: sodium chloride, antiseptic oral rinse, docusate sodium, haloperidol lactate, ondansetron (ZOFRAN) IV, polyvinyl alcohol, potassium chloride, sodium chloride flush, sodium chloride flush, traMADol  Physical Exam  Awake, confused.  Appears slightly agitated.  [name] at bedside CV RRR.   Surgical site without erythema, or edema Resp:  On N/C  NAD Abd.  Thin, NT Ext.  Able to move all 4.     Vital Signs: BP 88/64 mmHg  Pulse 40  Temp(Src)  98.8 F (37.1 C) (Oral)  Resp 20  Ht 6\' 2"  (1.88 m)  Wt 80.2 kg (176 lb 12.9 oz)  BMI 22.69 kg/m2  SpO2 98% SpO2: SpO2: 98 % O2 Device: O2 Device: Nasal Cannula O2 Flow Rate: O2 Flow Rate (L/min): 2 L/min  Intake/output summary:  Intake/Output Summary (Last 24 hours) at 09/05/15 1120 Last data filed at 09/05/15 1041  Gross per 24 hour  Intake 1689.82 ml  Output   4400 ml  Net -2710.18 ml   LBM: Last BM Date: 09/02/15 Baseline Weight: Weight: 80.287 kg (177 lb) Most recent weight: Weight: 80.2 kg (176 lb 12.9 oz)       Palliative Assessment/Data:    Flowsheet Rows        Most Recent Value   Intake Tab    Referral Department  Cardiology   Unit at Time of Referral  Intermediate Care Unit   Palliative Care Primary Diagnosis  Cardiac   Date Notified  08/23/15   Palliative Care Type  New Palliative care   Reason for referral  Other (Comment)   Date of Admission  08/21/15   Date first seen by Palliative Care  08/23/15   # of days Palliative referral response time  0 Day(s)   # of days IP prior to Palliative referral  2   Clinical Assessment    Palliative Performance Scale Score  50%   Psychosocial & Spiritual Assessment    Palliative Care Outcomes    [name]/Family meeting held?  Yes   Who was at the meeting?  [name], [name], daughters Bjorn Loser and [place]   Palliative Care Outcomes  Other (Comment)   [name]/Family wishes: Interventions discontinued/not started   Tube feedings/TPN, PEG      [name] Active Problem List   Diagnosis Date Noted  . LVAD (left ventricular assist device)  present (HCC)   . Insomnia   . Protein-calorie malnutrition, severe 08/23/2015  . Palliative care encounter   . Goals of care, counseling/discussion   . Acute on chronic systolic (congestive) heart failure (HCC) 08/21/2015  . Acute on chronic systolic CHF (congestive heart failure) (HCC) 08/21/2015  . PVC (premature ventricular contraction) 01/04/2015  . Complete heart block (HCC)  12/07/2012  . Symptomatic bradycardia 11/28/2012  . 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 Chronic systolic heart failure (HCC) 10/27/2012  . 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 Left bundle branch block 10/06/2012  . Syncope 10/06/2012    Palliative Care Assessment & Plan      Goals of Care and Additional Recommendations:  Limitations on Scope of Treatment: Full Scope Treatment  Code Status:  Partial   Prognosis:   Unable to determine  Discharge Planning:  To Be Determined  Care plan was discussed with attending MD, bedside RN, [name]'s [name].  Thank you for allowing the Palliative Medicine Team to assist in the care of this [name].   Time In: 11:00 Time Out: 11:35 Total Time 35 Prolonged Time Billed no      Greater than 50%  of this time was spent counseling and coordinating care related to the above assessment and plan.  Algis Downs, PA-C Palliative Medicine Pager: [phone]  Please contact Palliative Medicine Team phone at [phone] for questions and concerns.

## 2015-09-06 ENCOUNTER — Inpatient Hospital Stay (HOSPITAL_COMMUNITY): Payer: Medicare Other

## 2015-09-06 LAB — POCT I-STAT, CHEM 8
BUN: 20 mg/dL (ref 6–20)
CHLORIDE: 95 mmol/L — AB (ref 101–111)
CREATININE: 0.8 mg/dL (ref 0.61–1.24)
Calcium, Ion: 1.27 mmol/L — ABNORMAL HIGH (ref 1.12–1.23)
Glucose, Bld: 133 mg/dL — ABNORMAL HIGH (ref 65–99)
HEMATOCRIT: 27 % — AB (ref 39.0–52.0)
Hemoglobin: 9.2 g/dL — ABNORMAL LOW (ref 13.0–17.0)
Potassium: 3.8 mmol/L (ref 3.5–5.1)
Sodium: 136 mmol/L (ref 135–145)
TCO2: 28 mmol/L (ref 0–100)

## 2015-09-06 LAB — GLUCOSE, CAPILLARY
GLUCOSE-CAPILLARY: 163 mg/dL — AB (ref 65–99)
GLUCOSE-CAPILLARY: 129 mg/dL — AB (ref 65–99)
Glucose-Capillary: 100 mg/dL — ABNORMAL HIGH (ref 65–99)
Glucose-Capillary: 129 mg/dL — ABNORMAL HIGH (ref 65–99)
Glucose-Capillary: 108 mg/dL — ABNORMAL HIGH (ref 65–99)

## 2015-09-06 LAB — CBC
HCT: 29.1 % — ABNORMAL LOW (ref 39.0–52.0)
Hemoglobin: 9 g/dL — ABNORMAL LOW (ref 13.0–17.0)
MCH: 27.3 pg (ref 26.0–34.0)
MCHC: 30.9 g/dL (ref 30.0–36.0)
MCV: 88.2 fL (ref 78.0–100.0)
Platelets: 160 10*3/uL (ref 150–400)
RBC: 3.3 MIL/uL — ABNORMAL LOW (ref 4.22–5.81)
RDW: 17.3 % — ABNORMAL HIGH (ref 11.5–15.5)
WBC: 7 10*3/uL (ref 4.0–10.5)

## 2015-09-06 LAB — COMPREHENSIVE METABOLIC PANEL
ALT: 25 U/L (ref 17–63)
AST: 26 U/L (ref 15–41)
Albumin: 2.8 g/dL — ABNORMAL LOW (ref 3.5–5.0)
Alkaline Phosphatase: 54 U/L (ref 38–126)
Anion gap: 8 (ref 5–15)
BUN: 18 mg/dL (ref 6–20)
CO2: 28 mmol/L (ref 22–32)
Calcium: 8.7 mg/dL — ABNORMAL LOW (ref 8.9–10.3)
Chloride: 100 mmol/L — ABNORMAL LOW (ref 101–111)
Creatinine, Ser: 0.73 mg/dL (ref 0.61–1.24)
GFR calc Af Amer: >60 mL/min (ref >60)
GFR calc non Af Amer: >60 mL/min (ref >60)
Glucose, Bld: 116 mg/dL — ABNORMAL HIGH (ref 65–99)
Potassium: 3.6 mmol/L (ref 3.5–5.1)
Sodium: 136 mmol/L (ref 135–145)
Total Bilirubin: 1.1 mg/dL (ref 0.3–1.2)
Total Protein: 5.3 g/dL — ABNORMAL LOW (ref 6.5–8.1)

## 2015-09-06 LAB — URINE CULTURE
CULTURE: NO GROWTH
Special Requests: NORMAL

## 2015-09-06 LAB — POCT I-STAT 4, (NA,K, GLUC, HGB,HCT)
GLUCOSE: 118 mg/dL — AB (ref 65–99)
HEMATOCRIT: 25 % — AB (ref 39.0–52.0)
Hemoglobin: 8.5 g/dL — ABNORMAL LOW (ref 13.0–17.0)
POTASSIUM: 3.5 mmol/L (ref 3.5–5.1)
SODIUM: 136 mmol/L (ref 135–145)

## 2015-09-06 LAB — CARBOXYHEMOGLOBIN
Carboxyhemoglobin: 1.9 % — ABNORMAL HIGH (ref 0.5–1.5)
Methemoglobin: 0.9 % (ref 0.0–1.5)
O2 Saturation: 59.7 %
Total hemoglobin: 9.2 g/dL — ABNORMAL LOW (ref 13.5–18.0)

## 2015-09-06 LAB — PROTIME-INR
INR: 2.5 — ABNORMAL HIGH (ref 0.00–1.49)
PROTHROMBIN TIME: 26.7 s — AB (ref 11.6–15.2)

## 2015-09-06 LAB — MAGNESIUM: Magnesium: 2.2 mg/dL (ref 1.7–2.4)

## 2015-09-06 LAB — LACTATE DEHYDROGENASE: LDH: 263 U/L — AB (ref 98–192)

## 2015-09-06 MED ORDER — FUROSEMIDE 10 MG/ML IJ SOLN
20.0000 mg | Freq: Two times a day (BID) | INTRAMUSCULAR | Status: DC
  Administered 2015-09-06 – 2015-09-07 (×2): 20 mg via INTRAVENOUS
  Filled 2015-09-06 (×2): qty 2

## 2015-09-06 MED ORDER — WARFARIN - PHARMACIST DOSING INPATIENT
Freq: Every day | Status: DC
  Administered 2015-09-06 – 2015-09-12 (×4)
  Administered 2015-09-14: 1
  Administered 2015-09-15 – 2015-09-17 (×2)

## 2015-09-06 MED ORDER — WARFARIN SODIUM 3 MG PO TABS
4.0000 mg | ORAL_TABLET | Freq: Once | ORAL | Status: AC
  Administered 2015-09-06: 4 mg via ORAL
  Filled 2015-09-06: qty 0.5

## 2015-09-06 MED ORDER — POTASSIUM CHLORIDE 10 MEQ/50ML IV SOLN
10.0000 meq | INTRAVENOUS | Status: AC | PRN
  Administered 2015-09-06 – 2015-09-07 (×3): 10 meq via INTRAVENOUS
  Filled 2015-09-06 (×3): qty 50

## 2015-09-06 MED ORDER — METOLAZONE 5 MG PO TABS
5.0000 mg | ORAL_TABLET | Freq: Every day | ORAL | Status: DC
  Administered 2015-09-06: 5 mg via ORAL
  Filled 2015-09-06: qty 1

## 2015-09-06 NOTE — Progress Notes (Addendum)
Molly, VAD Coordinator paged about PI's rising from 5 to >7 in a two hour time period. No change to pump flow or power. MAPs also elevated mid 90s. Pain treated with little BP relief. No PRNs for BP ordered. Weight up from 80 kg to 82 kg. Per VAD Coordinator okay to address with MD during AM rounds. Will continue to monitor.  Harvin Hazel, RN

## 2015-09-06 NOTE — Progress Notes (Signed)
ANTICOAGULATION CONSULT NOTE - Follow Up Consult  Pharmacy Consult for Warfarin  Indication: LVAD  Allergies  Allergen Reactions  . Phenergan [Promethazine Hcl] Nausea And Vomiting  . Lasix [Furosemide] Other (See Comments)    "Dropped blood pressure too low"  . Morphine And Related Other (See Comments)    Goes crazy     Patient Measurements: Height: 6\' 2"  (188 cm) Weight: 182 lb 1.6 oz (82.6 kg) IBW/kg (Calculated) : 82.2   Vital Signs: Temp: 98 F (36.7 C) (06/30 0851) Temp Source: Oral (06/30 0851) BP: 96/84 mmHg (06/30 0900) Pulse Rate: 73 (06/30 0900)  Labs:  Recent Labs  09/03/15 1955 09/04/15 0148  09/05/15 0447 09/05/15 1702 09/06/15 0350  HGB  --  9.6*  < > 9.0* 9.5* 9.0*  HCT  --  31.1*  < > 29.1* 28.0* 29.1*  PLT  --  132*  --  145*  --  160  APTT 62*  --   --   --   --   --   LABPROT  --  26.9*  --  27.6*  --  26.7*  INR  --  2.53*  --  2.62*  --  2.50*  CREATININE  --  0.84  < > 0.74 0.70 0.73  < > = values in this interval not displayed.  Estimated Creatinine Clearance: 95.6 mL/min (by C-G formula based on Cr of 0.73).     Assessment: 74yom with HF.  Post Op dah.y #7 LVAD.  Remains in ICU with delerium.  INR 2.6>2.5 with warfarin 2.5mg  x2 days Will give slightly higher dose to prevent fall < 2.   H/H low stable PLTC 160.  Remains on amiodarone po - started preop - for NSVT  Goal of Therapy:  INR 2-2.5 Monitor platelets by anticoagulation protocol: Yes   Plan:  Warfarin 4mg  x1 Daily INR Monitor for s/s bleeding   Leota Sauers Pharm.D. CPP, BCPS Clinical Pharmacist [phone] 09/06/2015 10:36 AM

## 2015-09-06 NOTE — Care Management Important Message (Signed)
Important Message  Patient Details  Name: [name] MRN: [id number] Date of Birth: Jul 24, 1940   Medicare Important Message Given:  Yes    Kyla Balzarine 09/06/2015, 10:55 AM

## 2015-09-06 NOTE — Progress Notes (Signed)
Physical Therapy Treatment Patient Details Name: [name] MRN: [id number] DOB: 1941/01/27 Today's Date: 09/06/2015    History of Present Illness Pt is a 75 y/o M who was [facility] on 08/21/2015 [facility] with acute on chronic systolic heart failure. He is undergoing preparation this week for an LVAD placement (scheduled for 08/30/15). Pt's PMH inlcudes frequent PVCs, nonischemic cardiomyopathy, automatic ICD. Pt had LVAD placed 6/23.    PT Comments    Pt more alert today but continued to keep eyes closed much of the session. Able to amb but with heavy assist of 2 people with 2 more people to manage chair behind and lines/tubes. Pt with no awareness of midline in standing with pt continually moving both feet to his rt side while his shoulders and trunk lean heavier and heavier to the lt side. Currently cognitive deficits are making amb very difficult.  Follow Up Recommendations  CIR     Equipment Recommendations  None recommended by PT    Recommendations for Other Services       Precautions / Restrictions Precautions Precautions: Sternal Restrictions Other Position/Activity Restrictions: sternal precautions    Mobility  Bed Mobility               General bed mobility comments: OOB in chair  Transfers Overall transfer level: Needs assistance Equipment used: Pushed w/c Transfers: Sit to/from Stand Sit to Stand: +2 physical assistance;Max assist         General transfer comment: Poor transitional movements. Difficulty achieving midline orientation. Max physical cues to sit. Pt feels that he is falling.  Ambulation/Gait Ambulation/Gait assistance: +2 physical assistance;Mod assist;Max assist Ambulation Distance (Feet): 180 Feet (110' x 1, 180' x 1) Assistive device:  (Pushing w/c) Gait Pattern/deviations: Step-through pattern;Decreased stride length;Ataxic;Scissoring;Narrow base of support Gait velocity: decr Gait velocity  interpretation: Below normal speed for age/gender General Gait Details: Pt with both feet to the right of his center of gravity causing him to have a heavy lt lateral lean. Pt with no awareness of midline orientation. Even with both feet to the right of his center of gravity he continued to step across with his lt foot to the outside of his rt foot. When feet not scissoring pt stepping on his own feet. Heavy assist by therapist on the lt to push trunk and shoulders to rt and therapist on the rt providing heavy assist pulling hips to the rt.   Stairs            Wheelchair Mobility    Modified Rankin (Stroke Patients Only)       Balance Overall balance assessment: Needs assistance Sitting-balance support: Bilateral upper extremity supported Sitting balance-Leahy Scale: Poor Sitting balance - Comments: Min A to sit edge of chair. Postural control: Posterior lean Standing balance support: Bilateral upper extremity supported Standing balance-Leahy Scale: Zero Standing balance comment: Heavy lean to lt.                    Cognition Arousal/Alertness: Lethargic Behavior During Therapy: Restless;Impulsive;Flat affect Overall Cognitive Status: Impaired/Different from baseline Area of Impairment: Orientation;Attention;Memory;Following commands;Safety/judgement;Awareness;Problem solving Orientation Level: Disoriented to;Place;Time;Situation Current Attention Level: Focused Memory: Decreased recall of precautions;Decreased short-term memory Following Commands: Follows one step commands inconsistently Safety/Judgement: Decreased awareness of safety;Decreased awareness of deficits Awareness: Intellectual Problem Solving: Slow processing;Decreased initiation;Difficulty sequencing;Requires verbal cues;Requires tactile cues General Comments: Apparent difficulty with motor planning    Exercises      General Comments  Pertinent Vitals/Pain Pain Assessment: Faces Faces  Pain Scale: Hurts a little bit Pain Location: unable to state Pain Descriptors / Indicators: Grimacing Pain Intervention(s): Limited activity within patient's tolerance;Monitored during session    Home Living                      Prior Function            PT Goals (current goals can now be found in the care plan section) Acute Rehab PT Goals Patient Stated Goal: to be able to go to the beach again Progress towards PT goals: Progressing toward goals    Frequency  Min 3X/week    PT Plan Current plan remains appropriate    Co-evaluation PT/OT/SLP Co-Evaluation/Treatment: Yes Reason for Co-Treatment: Complexity of the patient's impairments (multi-system involvement);For patient/therapist safety PT goals addressed during session: Mobility/safety with mobility;Balance OT goals addressed during session: ADL's and self-care;Strengthening/ROM     End of Session Equipment Utilized During Treatment: Oxygen Activity Tolerance: Other (comment) (Limited by cognition) Patient left: in chair;with nursing/sitter in room     Time: 0802-0836 PT Time Calculation (min) (ACUTE ONLY): 34 min  Charges:  $Gait Training: 8-22 mins                    G Codes:      [name] September 07, 2015, 2:30 PM [employer] PT [phone]

## 2015-09-06 NOTE — Progress Notes (Addendum)
Occupational Therapy Treatment Patient Details Name: [name] MRN: [id number] DOB: 1940/07/18 Today's Date: 09/06/2015    History of present illness Pt is a 75 y/o M who was admitted on 08/21/2015 from the [facility] with acute on chronic systolic heart failure. He is undergoing preparation this week for an LVAD placement (scheduled for 08/30/15). Pt's PMH inlcudes frequent PVCs, nonischemic cardiomyopathy, automatic ICD. Pt had LVAD placed 6/23.   OT comments  Pt apparently confused and disoriented, but able to ambulate around unit with +2 Mod A (+3 for equipment management) with 1 rest break. Desat @ 88 with increased work of breathing - placed on O2 during remaining mobility. Pt demonstrating difficulty with motor planning during mobility (raising foot off ground when attempting sit - stand, scissoring feet when walking, putting lotion on his chin with his comb), and was unable to achieve midline postural control - L lateral lean. Appears to be having visual hallucinations at times. Continue to recommend CIR for rehab. Recommend pt OOB daily with nursing using "stedy", although if pt is more confused, will need to use maximove. Recommend limiting TV, lights on during the day, limiting distractions with visitors and frequent reorientation to reduce delirium. Will continue to follow to address established goals and maximize functional level of independence.   Follow Up Recommendations  CIR;Supervision/Assistance - 24 hour    Equipment Recommendations  Other (comment)    Recommendations for Other Services Rehab consult    Precautions / Restrictions Precautions Precautions: Sternal Restrictions Other Position/Activity Restrictions: sternal precautions       Mobility Bed Mobility               General bed mobility comments: OOB in chair  Transfers Overall transfer level: Needs assistance Equipment used: Pushed w/c Transfers: Sit to/from Stand Sit to Stand:  +2 physical assistance;Max assist - required physical assist to facilitate hip flexion to get pt to sit in chair.          General transfer comment: Poor transitional movements. Difficulty achieving midline orientation. Max physical cues to sit. Pt feels that he is falling.    Balance Overall balance assessment: Needs assistance   Sitting balance-Leahy Scale: Poor       Standing balance-Leahy Scale: Poor                     ADL Overall ADL's : Needs assistance/impaired     Grooming: Supervision/safety;Set up;Sitting Grooming Details (indicate cue type and reason): Pt able to wash face and comb hair after set up and continued VC. Pt then using comb to "rub lotion on his face".                             Functional mobility during ADLs: Moderate assistance;+2 for physical assistance - Pt pushing w/c. Forward head. Pushing L, especially when initiating activity, scissoring feet. Once a rhythm established, pt requiring less assistance for stability.         Vision                     Perception     Praxis  difficulty with motor planning    Cognition   Behavior During Therapy: Restless;Impulsive;Flat affect Overall Cognitive Status: Impaired/Different from baseline Area of Impairment: Orientation;Attention;Memory;Following commands;Safety/judgement;Awareness;Problem solving Orientation Level: Disoriented to;Place;Time;Situation Current Attention Level: Focused Memory: Decreased recall of precautions;Decreased short-term memory  Following Commands: Follows one step commands inconsistently Safety/Judgement: Decreased  awareness of safety;Decreased awareness of deficits Awareness: Intellectual Problem Solving: Slow processing;Decreased initiation;Difficulty sequencing;Requires verbal cues;Requires tactile cues General Comments: Apparent difficulty with motor planning    Extremity/Trunk Assessment     general BUE weakness           Exercises     Shoulder Instructions       General Comments      Pertinent Vitals/ Pain       Pain Assessment: Faces Faces Pain Scale: Hurts a little bit Pain Location: unable to state Pain Descriptors / Indicators: Grimacing Pain Intervention(s): Limited activity within patient's tolerance  Home Living                                          Prior Functioning/Environment              Frequency Min 3X/week     Progress Toward Goals  OT Goals(current goals can now be found in the care plan section)  Progress towards OT goals: Progressing toward goals  Acute Rehab OT Goals Patient Stated Goal: to be able to go to the beach again OT Goal Formulation: With patient/family Time For Goal Achievement: 09/16/15 Potential to Achieve Goals: Good ADL Goals Pt Will Perform Upper Body Bathing: with set-up;with supervision;sitting Pt Will Perform Lower Body Bathing: sit to/from stand;with min assist Pt Will Perform Lower Body Dressing: with min assist;sit to/from stand Pt Will Transfer to Toilet: with min guard assist;bedside commode;stand pivot transfer Additional ADL Goal #1: Pt will demonstrte ability to manage LVAD equipmetn during ADL task with min vc  Plan Discharge plan remains appropriate    Co-evaluation    PT/OT/SLP Co-Evaluation/Treatment: Yes Reason for Co-Treatment: Complexity of the patient's impairments (multi-system involvement);Necessary to address cognition/behavior during functional activity;For patient/therapist safety   OT goals addressed during session: ADL's and self-care;Strengthening/ROM      End of Session  in chair with call bell; sitter   Activity Tolerance  tolerated well   Patient Left  in chair   Nurse Communication  mobility and use of lift equipment        Time: [phone] OT Time Calculation (min): 46 min  Charges:    Jillianne Gamino,HILLARY 09/06/2015, 1:02 PM   [facility], OTR/L  [phone] 09/06/2015

## 2015-09-06 NOTE — Progress Notes (Signed)
Patient ID: [name], male   DOB: 07/06/1940, 75 y.o.   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 Normal cors. EF 10% with low cardiac output.   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    Remains on milrinone 0.25 mcg. CO-OX 60%. Weight up 6 pounds. MAPs in 70-80s    Remains confused.    Flow: 6.3 L/min  Speed: 9000 PI 5.9  PP 6.2  No PI events.   Objective:   Vital Signs: MAPs 80s   Temp:  [97.9 F (36.6 C)-99.2 F (37.3 C)] 98 F (36.7 C) (06/30 0851) Pulse Rate:  [93-105] 93 (06/30 0700) Resp:  [15-31] 21 (06/30 0700) BP: (87-129)/(64-90) 95/78 mmHg (06/30 0700) SpO2:  [87 %-100 %] 97 % (06/30 0700) Weight:  [182 lb 1.6 oz (82.6 kg)] 182 lb 1.6 oz (82.6 kg) (06/30 0500) Last BM Date: 09/03/15  Weight change: Filed Weights   09/04/15 0500 09/05/15 0500 09/06/15 0500  Weight: 180 lb 8.9 oz (81.9 kg) 176 lb 12.9 oz (80.2 kg) 182 lb 1.6 oz (82.6 kg)    Intake/Output:   Intake/Output Summary (Last 24 hours) at 09/06/15 0855 Last data filed at 09/06/15 0618  Gross per 24 hour  Intake 1236.2 ml  Output   2270 ml  Net -1033.8 ml     Physical Exam: CVP  15 in the chair General: In bed. Confused. Wife and sitter at bedside HEENT: normal  Neck: supple. RIJ TLC. JVP jaw.  Cor:  LVAD hum.  Lungs: Decreased breath sounds at bases bilaterally.  Abdomen: soft, mildly distended. No hepatosplenomegaly. Hypoactive bowel sounds. Driveline site ok. Securement device in place.  Extremities: no cyanosis, clubbing, rash, R and LLE trace edema  Neuro: Orient to self.  Non focal  GU: Foley.  Telemetry:  A-V sequential pacing frequent PVCs 80-90s  Labs: Basic Metabolic Panel:  Recent Labs Lab 08/31/15 0400  09/02/15 0404  09/03/15 0345  09/04/15 0148 09/04/15 1609 09/05/15 0447 09/05/15 1702 09/06/15 0350  NA 134*  < > 130*  < > 133*  < > 136 133* 136 135 136  K  4.6  < > 3.8  < > 3.4*  < > 3.6 4.3 3.7 5.5* 3.6  CL 100*  < > 95*  < > 95*  < > 98* 91* 97* 98* 100*  CO2 27  < > 29  --  30  --  29  --  30  --  28  GLUCOSE 96  < > 132*  < > 112*  < > 102* 187* 111* 149* 116*  BUN 18  < > 19  < > 21*  < > 17 21* CREATININE 0.86  < > 0.85  < > 0.89  < > 0.84 0.70 0.74 0.70 0.73  CALCIUM 8.3*  < > 8.6*  --  8.8*  --  8.6*  --  8.9  --  8.7*  MG 2.5*  < > 2.3  --  2.3  --  2.1  --  1.8  --  2.2  PHOS 3.6  --   --   --   --   --   --   --   --   --   --   < > = values in this interval not displayed.  Liver Function Tests:  Recent Labs Lab 09/02/15  0404 09/03/15 0345 09/04/15 0148 09/05/15 0447 09/06/15 0350  AST 47* 36 33 30 26  ALT 31 30 27 25 25   ALKPHOS 40 53 52 49 54  BILITOT 2.2* 1.5* 0.9 1.1 1.1  PROT 5.2* 5.3* 5.3* 5.7* 5.3*  ALBUMIN 3.0* 2.9* 3.0* 2.9* 2.8*   No results for input(s): LIPASE, AMYLASE in the last 168 hours.  Recent Labs Lab 09/05/15 1100  AMMONIA 16    CBC:  Recent Labs Lab 09/01/15 0344  09/02/15 0404  09/03/15 0345  09/04/15 0148 09/04/15 1609 09/05/15 0447 09/05/15 1702 09/06/15 0350  WBC 9.8  --  7.8  --  5.9  --  5.7  --  5.8  --  7.0  NEUTROABS 8.6*  --  6.7  --  4.6  --  4.1  --  4.3  --   --   HGB 8.0*  < > 7.8*  < > 9.0*  < > 9.6* 18.7* 9.0* 9.5* 9.0*  HCT 26.2*  < > 25.8*  < > 29.1*  < > 31.1* 55.0* 29.1* 28.0* 29.1*  MCV 84.8  --  84.9  --  84.6  --  85.2  --  86.1  --  88.2  PLT 114*  --  115*  --  113*  --  132*  --  145*  --  160  < > = values in this interval not displayed.  Cardiac Enzymes: No results for input(s): CKTOTAL, CKMB, CKMBINDEX, TROPONINI in the last 168 hours.  BNP: BNP (last 3 results)  Recent Labs  05/13/15 2248 08/21/15 1345 08/31/15 0400  BNP 1235.6* 2132.9* 1387.0*    ProBNP (last 3 results) No results for input(s): PROBNP in the last 8760 hours.    Other results:  Imaging: Dg Chest Port 1 View  09/06/2015  CLINICAL DATA:  Left ventricular  assist device. EXAM: PORTABLE CHEST 1 VIEW COMPARISON:  09/05/2015. FINDINGS: Right PICC line stable position. AICD in stable position. Left ventricular assist device again noted. Prior median sternotomy. Stable cardiomegaly. Persistent but slightly improving bilateral infiltrates consistent pulmonary edema. Persistent bilateral pleural effusions. No pneumothorax. IMPRESSION: 1. Right PICC line stable position. AICD and left ventricular assist device in stable position. 2. Prior median sternotomy. Persistent severe cardiomegaly. Interim slight improvement of bilateral pulmonary infiltrates consistent with slight improvement of pulmonary edema. Persistent bilateral pleural effusions. No pneumothorax. Electronically Signed   By: Maisie Fus  Register   On: 09/06/2015 07:43   Dg Chest Port 1 View  09/05/2015  CLINICAL DATA:  Left ventricular assist device EXAM: PORTABLE CHEST 1 VIEW COMPARISON:  09/04/2015 FINDINGS: Left ventricular assist device not imaged due to positioning of the patient. Right arm PICC tip in the SVC unchanged. AICD unchanged in position. Bilateral pulmonary edema unchanged. Small effusions bilaterally unchanged. Bibasilar atelectasis has progressed due to hypoventilation. No pneumothorax IMPRESSION: Pulmonary edema unchanged. Progression of bibasilar atelectasis left greater than right. Electronically Signed   By: Marlan Palau M.D.   On: 09/05/2015 07:42     Medications:     Scheduled Medications: . amiodarone  400 mg Oral BID  . antiseptic oral rinse  7 mL Mouth Rinse q12n4p  . aspirin EC  81 mg Oral Daily  . bisacodyl  10 mg Oral Daily   Or  . bisacodyl  10 mg Rectal Daily  . chlorhexidine  15 mL Mouth Rinse BID  . feeding supplement (ENSURE ENLIVE)  237 mL Oral TID BM  . ferrous fumarate-b12-vitamic C-folic acid  1 capsule Oral  TID PC  . furosemide  20 mg Intravenous Daily  . insulin aspart  0-20 Units Subcutaneous Q4H  . metolazone  5 mg Oral Daily  . mirtazapine  15 mg  Oral QHS  . OLANZapine  5 mg Oral QHS  . pantoprazole  40 mg Oral Daily  . sodium chloride flush  10-40 mL Intracatheter Q12H  . sodium chloride flush  3 mL Intravenous Q12H  . Warfarin - Physician Dosing Inpatient   Does not apply q1800  . zolpidem  5 mg Oral QHS    Infusions: . sodium chloride Stopped (08/31/15 1700)  . sodium chloride 250 mL (08/31/15 0540)  . sodium chloride 10 mL/hr at 08/30/15 1500  . lactated ringers Stopped (08/30/15 1500)  . lactated ringers Stopped (09/02/15 1200)  . milrinone 0.25 mcg/kg/min (09/06/15 0600)    PRN Medications: sodium chloride, antiseptic oral rinse, docusate sodium, ondansetron (ZOFRAN) IV, polyvinyl alcohol, sodium chloride flush, sodium chloride flush, traMADol   Assessment:   1. 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 EF 15%. Cath 8/14 with normal cors. 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  2. Frequent PVCs 3. Severe Malnutrition- Prealbumin 14.7 on 6/20 4. Acute post-op delirium/sundowning  Plan/Discussion:    POD 7 s/p HM II LVAD.  Continues to struggle with post-op with confusion.  Remains on milrinone 0.25 mcg.  Weight up 6 pounds. Increase lasix to twice a daily. Continue metolazone. Renal function stable.    Ongoing sundowning/acute delirium. On zyprexa. Has sitter.   Continue  amio 400 mg po twice a day.   VAD parameters stable. Off heparin. INR 2.5. On coumadin. On 81 mg aspirin.    PT following. CIR potential.   Length of Stay: 16 Amy Clegg NP-C   09/06/2015, 8:55 AM  Patient seen with NP, agree with the above note.  Remains confused when awake.  Sleeping most of the day so far.  LVAD parameters stable.  CVP elevated, co-ox 60%.  - Minimize sedating medications.  - Continue milrinone at 0.25. - Increase IV lasix to 20 mg bid with metolazone.    Needs to be less confused to participate with PT.   Marca Ancona 09/06/2015

## 2015-09-06 NOTE — Progress Notes (Signed)
Admitted 08/21/15 due to acute on chronic systolic heart failure; severe NICM.  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 Maren Beach as Destination Therapy VAD.  Vital signs: HR:  93 Doppler MAP:   Automated BP:  95/78  O2 Sat: 97% on 2 L/[place] Wt in lbs:  158 > 199 > 188 > 187 > 184 > 180 > 182 lbs  CVP:   7  LVAD interrogation reveals:  Speed:  9000 Flow: 4.4 Power:  4.9  PI:  7.1 Alarms: none Events:  none Fixed speed:  9000 Low speed limit: 8400  Drive Line:  C/D/I. Being maintained daily with gauze dressing and aquacel silver strip.   Labs:  LDH trend:  255 > 281 > 256 > 263 > 273 > 263  INR trend: 1.45 > 1.48 > 1.42 > 1.38 > 1.85 > 2.53 > 2.5  Hgb:  9.5 > 8.4 > 8.2 > 8.0 > 7.8 > 9.0 > 9.6 > 9.0  Antithrombotic Management:   INR goal: 2.0 - 2.5 Warfarin - started 08/31/15; reached therapeutic range 09/04/15 ASA 325 mg - started 09/02/15; dropped to 81 mg after INR reached therapeutic range Heparin gtt - started 09/02/15; stopped 09/03/15   Infusions: Milrinone - 0.25  Amiodarone - 400 mg BID Dopamine -  Stopped 09/05/15 Epi - stopped 09/02/15 Levo - stopped 09/01/15  OR Blood Products:  4 units FFP 2 unit plts  ICU Blood Products:  09/02/15 - 1 unit PCs  Ventilator: Extubated 08/31/15 to 5L [place] and 2ppm NO  NO - off 09/01/15  Stool: 09/02/15 (3 loose stools)  Plan/Recommendations:  1.  Remains confused; sitter and wife at bedside. 2.  Walked 350 feet with rehab today; wife is concerned because pt is leaning to the left side.   Carlton Adam, RN VAD Coordinator [phone] 24/7 VAD pager: [phone]

## 2015-09-06 NOTE — Care Management Note (Signed)
Case Management Note  Patient Details  Name: [name] MRN: [id number] Date of Birth: Aug 12, 1940  Subjective/Objective:     Pt S/p LVAD               Action/Plan: From home with wife. Continues to struggle with post-op with confusion. Remains on milrinone 0.25 mcg. Weight up 6 pounds. Increase lasix to twice a daily. Continue metolazone. Renal function stable.   Ongoing sundowning/acute delirium. On zyprexa. Has sitter.   Continue amio 400 mg po twice a day.   VAD parameters stable. Off heparin. INR 2.5. On coumadin. On 81 mg aspirin.   PT following. CIR potential.   Expected Discharge Date:  08/23/15               Expected Discharge Plan:  IP Rehab Facility  In-House Referral:  Clinical Social Work  Discharge planning Services  CM Consult  Post Acute Care Choice:    Choice offered to:     DME Arranged:    DME Agency:     HH Arranged:    HH Agency:     Status of Service:  In process, will continue to follow  If discussed at Long Length of Stay Meetings, dates discussed:    Additional Comments: CSW consulted for CIR back up plan. Cherylann Parr, RN 09/06/2015, 11:09 AM

## 2015-09-07 ENCOUNTER — Inpatient Hospital Stay (HOSPITAL_COMMUNITY): Payer: Medicare Other

## 2015-09-07 DIAGNOSIS — I5023 Acute on chronic systolic (congestive) heart failure: Secondary | ICD-10-CM

## 2015-09-07 DIAGNOSIS — Z95811 Presence of heart assist device: Secondary | ICD-10-CM

## 2015-09-07 LAB — GLUCOSE, CAPILLARY
GLUCOSE-CAPILLARY: 195 mg/dL — AB (ref 65–99)
GLUCOSE-CAPILLARY: 125 mg/dL — AB (ref 65–99)
Glucose-Capillary: 123 mg/dL — ABNORMAL HIGH (ref 65–99)
Glucose-Capillary: 136 mg/dL — ABNORMAL HIGH (ref 65–99)
Glucose-Capillary: 146 mg/dL — ABNORMAL HIGH (ref 65–99)
Glucose-Capillary: 97 mg/dL (ref 65–99)

## 2015-09-07 LAB — BASIC METABOLIC PANEL
Anion gap: 7 (ref 5–15)
BUN: 20 mg/dL (ref 6–20)
CALCIUM: 8.8 mg/dL — AB (ref 8.9–10.3)
CO2: 27 mmol/L (ref 22–32)
CREATININE: 0.94 mg/dL (ref 0.61–1.24)
Chloride: 99 mmol/L — ABNORMAL LOW (ref 101–111)
Glucose, Bld: 170 mg/dL — ABNORMAL HIGH (ref 65–99)
Potassium: 3.7 mmol/L (ref 3.5–5.1)
SODIUM: 133 mmol/L — AB (ref 135–145)

## 2015-09-07 LAB — COMPREHENSIVE METABOLIC PANEL
ALT: 26 U/L (ref 17–63)
AST: 28 U/L (ref 15–41)
Albumin: 2.6 g/dL — ABNORMAL LOW (ref 3.5–5.0)
Alkaline Phosphatase: 51 U/L (ref 38–126)
Anion gap: 7 (ref 5–15)
BUN: 15 mg/dL (ref 6–20)
CO2: 27 mmol/L (ref 22–32)
Calcium: 8.8 mg/dL — ABNORMAL LOW (ref 8.9–10.3)
Chloride: 100 mmol/L — ABNORMAL LOW (ref 101–111)
Creatinine, Ser: 0.87 mg/dL (ref 0.61–1.24)
GFR calc Af Amer: >60 mL/min (ref >60)
GFR calc non Af Amer: >60 mL/min (ref >60)
Glucose, Bld: 111 mg/dL — ABNORMAL HIGH (ref 65–99)
Potassium: 5.6 mmol/L — ABNORMAL HIGH (ref 3.5–5.1)
Sodium: 134 mmol/L — ABNORMAL LOW (ref 135–145)
Total Bilirubin: 1.3 mg/dL — ABNORMAL HIGH (ref 0.3–1.2)
Total Protein: 5.2 g/dL — ABNORMAL LOW (ref 6.5–8.1)

## 2015-09-07 LAB — CBC
HCT: 28 % — ABNORMAL LOW (ref 39.0–52.0)
Hemoglobin: 8.7 g/dL — ABNORMAL LOW (ref 13.0–17.0)
MCH: 26.9 pg (ref 26.0–34.0)
MCHC: 31.1 g/dL (ref 30.0–36.0)
MCV: 86.7 fL (ref 78.0–100.0)
Platelets: 167 10*3/uL (ref 150–400)
RBC: 3.23 MIL/uL — ABNORMAL LOW (ref 4.22–5.81)
RDW: 17.2 % — ABNORMAL HIGH (ref 11.5–15.5)
WBC: 6.5 10*3/uL (ref 4.0–10.5)

## 2015-09-07 LAB — PROTIME-INR
INR: 2.34 — ABNORMAL HIGH (ref 0.00–1.49)
Prothrombin Time: 25.4 seconds — ABNORMAL HIGH (ref 11.6–15.2)

## 2015-09-07 LAB — CARBOXYHEMOGLOBIN
Carboxyhemoglobin: 1.9 % — ABNORMAL HIGH (ref 0.5–1.5)
Methemoglobin: 0.7 % (ref 0.0–1.5)
O2 SAT: 68.3 %
Total hemoglobin: 8.7 g/dL — ABNORMAL LOW (ref 13.5–18.0)

## 2015-09-07 LAB — LACTATE DEHYDROGENASE: LDH: 233 U/L — ABNORMAL HIGH (ref 98–192)

## 2015-09-07 MED ORDER — WARFARIN SODIUM 3 MG PO TABS
4.0000 mg | ORAL_TABLET | Freq: Once | ORAL | Status: AC
  Administered 2015-09-07: 4 mg via ORAL
  Filled 2015-09-07: qty 0.5

## 2015-09-07 MED ORDER — FUROSEMIDE 10 MG/ML IJ SOLN: 20.0000 mg | Freq: Every day | INTRAMUSCULAR | Status: DC

## 2015-09-07 MED ORDER — FUROSEMIDE 40 MG PO TABS
40.0000 mg | ORAL_TABLET | Freq: Every day | ORAL | Status: DC
  Administered 2015-09-08: 40 mg via ORAL
  Filled 2015-09-07: qty 1

## 2015-09-07 MED ORDER — WARFARIN SODIUM 3 MG PO TABS: 4.0000 mg | ORAL_TABLET | Freq: Once | ORAL | Status: DC

## 2015-09-07 NOTE — Progress Notes (Addendum)
Patient ID: [name], male   DOB: 1940-05-07, 75 y.o.   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 Normal cors. EF 10% with low cardiac output.   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    Remains on milrinone 0.25 mcg. CO-OX 68%. Good diuresis yesterday, CVP down to 6.  MAP 80s.     He is sleeping currently.  Per nurse, he was awake earlier this morning and more alert/oriented.  Still very weak and required multiple people to get him out of bed to chair.   Flow: 4.8 L/min  Speed: 9000 PI 5.8  Power 4.7  No PI events.   Objective:   Vital Signs: MAPs 80s   Temp:  [98.1 F (36.7 C)-99.5 F (37.5 C)] 98.1 F (36.7 C) (07/01 0700) Pulse Rate:  [65-99] 89 (07/01 0600) Resp:  [15-24] 24 (07/01 0600) BP: (72-99)/(43-86) 87/75 mmHg (07/01 0100) SpO2:  [83 %-100 %] 94 % (07/01 0600) Weight:  [170 lb 6.7 oz (77.3 kg)] 170 lb 6.7 oz (77.3 kg) (07/01 0500) Last BM Date: 09/03/15  Weight change: Filed Weights   09/05/15 0500 09/06/15 0500 09/07/15 0500  Weight: 176 lb 12.9 oz (80.2 kg) 182 lb 1.6 oz (82.6 kg) 170 lb 6.7 oz (77.3 kg)    Intake/Output:   Intake/Output Summary (Last 24 hours) at 09/07/15 1011 Last data filed at 09/07/15 0600  Gross per 24 hour  Intake    600 ml  Output   3345 ml  Net  -2745 ml     Physical Exam: CVP  6 General: Sleeping in chair. NAD. HEENT: normal  Neck: supple. RIJ TLC. JVP 8 cm Cor:  LVAD hum.  Lungs: Decreased breath sounds at bases bilaterally.  Abdomen: soft, mildly distended. No hepatosplenomegaly. Hypoactive bowel sounds. Driveline site ok. Securement device in place.  Extremities: no cyanosis, clubbing, rash, R and LLE trace edema  Neuro: Orient to self.  Non focal  GU: Foley.  Telemetry:  Sinus with PVCs  Labs: Basic Metabolic Panel:  Recent Labs Lab 09/02/15 0404  09/03/15 0345  09/04/15 0148  09/05/15 4982  09/05/15 1702 09/06/15 0350 09/06/15 1634 09/06/15 2224 09/07/15 0345  NA 130*  < > 133*  < > 136  < > 136 135 136 136 136 134*  K 3.8  < > 3.4*  < > 3.6  < > 3.7 5.5* 3.6 3.8 3.5 5.6*  CL 95*  < > 95*  < > 98*  < > 97* 98* 100* 95*  --  100*  CO2 29  --  30  --  29  --  30  --  28  --   --  27  GLUCOSE 132*  < > 112*  < > 102*  < > 111* 149* 116* 133* 118* 111*  BUN 19  < > 21*  < > 17  < > 16 15 18 20   --  15  CREATININE 0.85  < > 0.89  < > 0.84  < > 0.74 0.70 0.73 0.80  --  0.87  CALCIUM 8.6*  --  8.8*  --  8.6*  --  8.9  --  8.7*  --   --  8.8*  MG 2.3  --  2.3  --  2.1  --  1.8  --  2.2  --   --   --   < > =  values in this interval not displayed.  Liver Function Tests:  Recent Labs Lab 09/03/15 0345 09/04/15 0148 09/05/15 0447 09/06/15 0350 09/07/15 0345  AST 36 33 ALT ALKPHOS 53 52 49 54 51  BILITOT 1.5* 0.9 1.1 1.1 1.3*  PROT 5.3* 5.3* 5.7* 5.3* 5.2*  ALBUMIN 2.9* 3.0* 2.9* 2.8* 2.6*   No results for input(s): LIPASE, AMYLASE in the last 168 hours.  Recent Labs Lab 09/05/15 1100  AMMONIA 16    CBC:  Recent Labs Lab 09/01/15 0344  09/02/15 0404  09/03/15 0345  09/04/15 0148  09/05/15 0447 09/05/15 1702 09/06/15 0350 09/06/15 1634 09/06/15 2224 09/07/15 0345  WBC 9.8  --  7.8  --  5.9  --  5.7  --  5.8  --  7.0  --   --  6.5  NEUTROABS 8.6*  --  6.7  --  4.6  --  4.1  --  4.3  --   --   --   --   --   HGB 8.0*  < > 7.8*  < > 9.0*  < > 9.6*  < > 9.0* 9.5* 9.0* 9.2* 8.5* 8.7*  HCT 26.2*  < > 25.8*  < > 29.1*  < > 31.1*  < > 29.1* 28.0* 29.1* 27.0* 25.0* 28.0*  MCV 84.8  --  84.9  --  84.6  --  85.2  --  86.1  --  88.2  --   --  86.7  PLT 114*  --  115*  --  113*  --  132*  --  145*  --  160  --   --  167  < > = values in this interval not displayed.  Cardiac Enzymes: No results for input(s): CKTOTAL, CKMB, CKMBINDEX, TROPONINI in the last 168 hours.  BNP: BNP (last 3 results)  Recent Labs  05/13/15 2248 08/21/15 1345  08/31/15 0400  BNP 1235.6* 2132.9* 1387.0*    ProBNP (last 3 results) No results for input(s): PROBNP in the last 8760 hours.    Other results:  Imaging: Dg Chest Port 1 View  09/07/2015  CLINICAL DATA:  Left ventricular assist device EXAM: PORTABLE CHEST 1 VIEW COMPARISON:  09/06/2015 FINDINGS: Cardiac shadow remains enlarged. The LVAD is not well visualized on this examination. A defibrillator is again seen. Increased density is noted in the bases bilaterally left greater than right stable from the prior exam. A right-sided PICC line is again identified and stable. IMPRESSION: Persistent bibasilar changes left greater than right. The overall appearance is stable from the prior exam. Electronically Signed   By: Alcide Clever M.D.   On: 09/07/2015 07:17   Dg Chest Port 1 View  09/06/2015  CLINICAL DATA:  Left ventricular assist device. EXAM: PORTABLE CHEST 1 VIEW COMPARISON:  09/05/2015. FINDINGS: Right PICC line stable position. AICD in stable position. Left ventricular assist device again noted. Prior median sternotomy. Stable cardiomegaly. Persistent but slightly improving bilateral infiltrates consistent pulmonary edema. Persistent bilateral pleural effusions. No pneumothorax. IMPRESSION: 1. Right PICC line stable position. AICD and left ventricular assist device in stable position. 2. Prior median sternotomy. Persistent severe cardiomegaly. Interim slight improvement of bilateral pulmonary infiltrates consistent with slight improvement of pulmonary edema. Persistent bilateral pleural effusions. No pneumothorax. Electronically Signed   By: Maisie Fus  Register   On: 09/06/2015 07:43     Medications:     Scheduled Medications: . amiodarone  400 mg Oral BID  .  antiseptic oral rinse  7 mL Mouth Rinse q12n4p  . aspirin EC  81 mg Oral Daily  . bisacodyl  10 mg Oral Daily   Or  . bisacodyl  10 mg Rectal Daily  . chlorhexidine  15 mL Mouth Rinse BID  . feeding supplement (ENSURE ENLIVE)  237  mL Oral TID BM  . ferrous fumarate-b12-vitamic C-folic acid  1 capsule Oral TID PC  . furosemide  20 mg Intravenous Daily  . insulin aspart  0-20 Units Subcutaneous Q4H  . mirtazapine  15 mg Oral QHS  . OLANZapine  5 mg Oral QHS  . pantoprazole  40 mg Oral Daily  . sodium chloride flush  10-40 mL Intracatheter Q12H  . sodium chloride flush  3 mL Intravenous Q12H  . warfarin  4 mg Oral ONCE-1800  . Warfarin - Pharmacist Dosing Inpatient   Does not apply q1800  . zolpidem  5 mg Oral QHS    Infusions: . sodium chloride Stopped (08/31/15 1700)  . sodium chloride 250 mL (08/31/15 0540)  . sodium chloride 10 mL/hr at 08/30/15 1500  . lactated ringers Stopped (08/30/15 1500)  . lactated ringers Stopped (09/02/15 1200)  . milrinone 0.25 mcg/kg/min (09/07/15 0600)    PRN Medications: sodium chloride, antiseptic oral rinse, docusate sodium, ondansetron (ZOFRAN) IV, polyvinyl alcohol, sodium chloride flush, sodium chloride flush, traMADol   Assessment:   1. 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 EF 15%. Cath 8/14 with normal cors. 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  2. Frequent PVCs 3. Severe Malnutrition- Prealbumin 14.7 on 6/20 4. Acute post-op delirium/sundowning  Plan/Discussion:    POD 8 s/p HM II LVAD.  Continues to struggle with post-op with confusion.  He is sleeping this morning but per nurse he was more alert earlier today when he was awake.    Remains on milrinone 0.25 mcg.  Very good diuresis yesterday, CVP down to 6.  Will decrease Lasix to 20 mg IV once daily and no metolazone today.    Continue  amio 400 mg po twice a day.   VAD parameters stable. Off heparin. INR 2.3. On coumadin. On 81 mg aspirin.    Very weak, needs lots of PT work.    Repeat K, would be surprised if it is truly elevated, ?hemolyzed this am.   Length of Stay: [address]  09/07/2015, 10:11 AM

## 2015-09-07 NOTE — Progress Notes (Addendum)
In pts room and pump flow showed +++, PI went down and power increased--all returned to baseline.  Pt asymptomatic with MAP of 90.  Made  VAD coordinator aware.
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
## 2015-09-07 NOTE — Progress Notes (Signed)
ANTICOAGULATION CONSULT NOTE - Follow Up Consult  Pharmacy Consult for Warfarin  Indication: LVAD  Allergies  Allergen Reactions  . Phenergan [Promethazine Hcl] Nausea And Vomiting  . Lasix [Furosemide] Other (See Comments)    "Dropped blood pressure too low"  . Morphine And Related Other (See Comments)    Goes crazy     Patient Measurements: Height: 6\' 2"  (188 cm) Weight: 170 lb 6.7 oz (77.3 kg) (with LVAD system controller) IBW/kg (Calculated) : 82.2   Vital Signs: Temp: 98.6 F (37 C) (07/01 0405) Temp Source: Oral (07/01 0405) BP: 87/75 mmHg (07/01 0100) Pulse Rate: 89 (07/01 0600)  Labs:  Recent Labs  09/05/15 0447  09/06/15 0350 09/06/15 1634 09/06/15 2224 09/07/15 0345  HGB 9.0*  < > 9.0* 9.2* 8.5* 8.7*  HCT 29.1*  < > 29.1* 27.0* 25.0* 28.0*  PLT 145*  --  160  --   --  167  LABPROT 27.6*  --  26.7*  --   --  25.4*  INR 2.62*  --  2.50*  --   --  2.34*  CREATININE 0.74  < > 0.73 0.80  --  0.87  < > = values in this interval not displayed.  Estimated Creatinine Clearance: 82.7 mL/min (by C-G formula based on Cr of 0.87).     Assessment: 74yom with HF.  Post Op day #8 LVAD.  Remains in ICU with delerium.  INR 2.34 (declining). Will give slightly higher dose to prevent fall < 2.   H/H low stable PLTC 167.  Remains on amiodarone po - started preop - for NSVT  Goal of Therapy:  INR 2-2.5 Monitor platelets by anticoagulation protocol: Yes   Plan:  Warfarin 4mg  x1 Daily INR Monitor for s/s bleeding   Tad Moore, BCPS  Clinical Pharmacist Pager [phone]  09/07/2015 7:31 AM

## 2015-09-08 ENCOUNTER — Inpatient Hospital Stay (HOSPITAL_COMMUNITY): Payer: Medicare Other

## 2015-09-08 LAB — COMPREHENSIVE METABOLIC PANEL
ALT: 28 U/L (ref 17–63)
AST: 31 U/L (ref 15–41)
Albumin: 2.5 g/dL — ABNORMAL LOW (ref 3.5–5.0)
Alkaline Phosphatase: 56 U/L (ref 38–126)
Anion gap: 10 (ref 5–15)
BUN: 16 mg/dL (ref 6–20)
CO2: 27 mmol/L (ref 22–32)
Calcium: 8.6 mg/dL — ABNORMAL LOW (ref 8.9–10.3)
Chloride: 98 mmol/L — ABNORMAL LOW (ref 101–111)
Creatinine, Ser: 0.84 mg/dL (ref 0.61–1.24)
GFR calc Af Amer: >60 mL/min (ref >60)
GFR calc non Af Amer: >60 mL/min (ref >60)
Glucose, Bld: 105 mg/dL — ABNORMAL HIGH (ref 65–99)
Potassium: 3.4 mmol/L — ABNORMAL LOW (ref 3.5–5.1)
Sodium: 135 mmol/L (ref 135–145)
Total Bilirubin: 1 mg/dL (ref 0.3–1.2)
Total Protein: 5.3 g/dL — ABNORMAL LOW (ref 6.5–8.1)

## 2015-09-08 LAB — GLUCOSE, CAPILLARY
GLUCOSE-CAPILLARY: 141 mg/dL — AB (ref 65–99)
GLUCOSE-CAPILLARY: 146 mg/dL — AB (ref 65–99)
Glucose-Capillary: 106 mg/dL — ABNORMAL HIGH (ref 65–99)
Glucose-Capillary: 120 mg/dL — ABNORMAL HIGH (ref 65–99)
Glucose-Capillary: 128 mg/dL — ABNORMAL HIGH (ref 65–99)
Glucose-Capillary: 98 mg/dL (ref 65–99)

## 2015-09-08 LAB — CARBOXYHEMOGLOBIN
Carboxyhemoglobin: 1.9 % — ABNORMAL HIGH (ref 0.5–1.5)
METHEMOGLOBIN: 0.6 % (ref 0.0–1.5)
O2 Saturation: 58.8 %
Total hemoglobin: 9 g/dL — ABNORMAL LOW (ref 13.5–18.0)

## 2015-09-08 LAB — CBC
HCT: 27.7 % — ABNORMAL LOW (ref 39.0–52.0)
Hemoglobin: 8.6 g/dL — ABNORMAL LOW (ref 13.0–17.0)
MCH: 26.7 pg (ref 26.0–34.0)
MCHC: 31 g/dL (ref 30.0–36.0)
MCV: 86 fL (ref 78.0–100.0)
Platelets: 195 10*3/uL (ref 150–400)
RBC: 3.22 MIL/uL — ABNORMAL LOW (ref 4.22–5.81)
RDW: 17.4 % — ABNORMAL HIGH (ref 11.5–15.5)
WBC: 6.7 10*3/uL (ref 4.0–10.5)

## 2015-09-08 LAB — PROTIME-INR
INR: 1.95 — ABNORMAL HIGH (ref 0.00–1.49)
Prothrombin Time: 22.1 seconds — ABNORMAL HIGH (ref 11.6–15.2)

## 2015-09-08 LAB — LACTATE DEHYDROGENASE: LDH: 245 U/L — AB (ref 98–192)

## 2015-09-08 MED ORDER — SORBITOL 70 % SOLN
30.0000 mL | Freq: Once | Status: AC
  Administered 2015-09-08: 30 mL via ORAL
  Filled 2015-09-08: qty 30

## 2015-09-08 MED ORDER — POTASSIUM CHLORIDE 10 MEQ/50ML IV SOLN
10.0000 meq | INTRAVENOUS | Status: AC | PRN
  Administered 2015-09-08 – 2015-09-11 (×3): 10 meq via INTRAVENOUS
  Filled 2015-09-08 (×7): qty 50

## 2015-09-08 MED ORDER — ALBUMIN HUMAN 25 % IV SOLN
12.5000 g | Freq: Four times a day (QID) | INTRAVENOUS | Status: AC
Start: 2015-09-08 — End: 2015-09-08
  Administered 2015-09-08 (×3): 12.5 g via INTRAVENOUS
  Filled 2015-09-08 (×3): qty 50

## 2015-09-08 MED ORDER — POTASSIUM CHLORIDE 10 MEQ/50ML IV SOLN
10.0000 meq | Freq: Once | INTRAVENOUS | Status: AC
  Administered 2015-09-08: 10 meq via INTRAVENOUS

## 2015-09-08 MED ORDER — AMIODARONE HCL 200 MG PO TABS
200.0000 mg | ORAL_TABLET | Freq: Two times a day (BID) | ORAL | Status: DC
  Administered 2015-09-08 – 2015-09-19 (×22): 200 mg via ORAL
  Filled 2015-09-08 (×22): qty 1

## 2015-09-08 MED ORDER — WARFARIN SODIUM 5 MG PO TABS
5.0000 mg | ORAL_TABLET | Freq: Once | ORAL | Status: AC
  Administered 2015-09-08: 5 mg via ORAL
  Filled 2015-09-08: qty 1

## 2015-09-08 MED ORDER — POTASSIUM CHLORIDE CRYS ER 20 MEQ PO TBCR
40.0000 meq | EXTENDED_RELEASE_TABLET | Freq: Once | ORAL | Status: DC
  Filled 2015-09-08: qty 2

## 2015-09-08 NOTE — Progress Notes (Signed)
Patient ID: [name], male   DOB: 01-29-1941, 75 y.o.   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 Normal cors. EF 10% with low cardiac output.   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    Remains on milrinone 0.25 mcg. CO-OX 59%. Good diuresis yesterday, CVP down to 2-3.  MAP 80s.     More awake. Able to walk in unit yesterday. Oriented to person and place.C/o constipation.   Flow: 5.1 L/min  Speed: 9000 PI 6.0  Power 5.0  No PI events.   Objective:   Vital Signs: MAPs 80s   Temp:  [97.8 F (36.6 C)-99.8 F (37.7 C)] 98.2 F (36.8 C) (07/02 0700) Pulse Rate:  [69-100] 89 (07/02 0700) Resp:  [18-30] 21 (07/02 0700) SpO2:  [88 %-99 %] 94 % (07/02 0700) Weight:  [168 lb 10.4 oz (76.5 kg)] 168 lb 10.4 oz (76.5 kg) (07/02 0600) Last BM Date: 09/03/15  Weight change: Filed Weights   09/06/15 0500 09/07/15 0500 09/08/15 0600  Weight: 182 lb 1.6 oz (82.6 kg) 170 lb 6.7 oz (77.3 kg) 168 lb 10.4 oz (76.5 kg)    Intake/Output:   Intake/Output Summary (Last 24 hours) at 09/08/15 1046 Last data filed at 09/08/15 0900  Gross per 24 hour  Intake    318 ml  Output   1590 ml  Net  -1272 ml     Physical Exam: CVP  2-3 General: Lying in bed NAD. HEENT: normal  Neck: supple. RIJ TLC. JVP flat cm Cor:  LVAD hum.  Lungs: Decreased breath sounds at bases bilaterally.  Abdomen: soft, mildly distended. No hepatosplenomegaly. Hypoactive bowel sounds. Driveline site ok. Securement device in place.  Extremities: no cyanosis, clubbing, rash, no edema Neuro: Orient to self and place.  Non focal  GU: Foley.  Telemetry:  Sinus with PVCs  Labs: Basic Metabolic Panel:  Recent Labs Lab 09/02/15 0404  09/03/15 0345  09/04/15 0148  09/05/15 7829  09/06/15 0350 09/06/15 1634 09/06/15 2224 09/07/15 0345 09/07/15 1140 09/08/15 0435  NA 130*  < > 133*  < > 136  < > 136  < > 136 136  136 134* 133* 135  K 3.8  < > 3.4*  < > 3.6  < > 3.7  < > 3.6 3.8 3.5 5.6* 3.7 3.4*  CL 95*  < > 95*  < > 98*  < > 97*  < > 100* 95*  --  100* 99* 98*  CO2 29  --  30  --  29  --  30  --  28  --   --  27 27 27   GLUCOSE 132*  < > 112*  < > 102*  < > 111*  < > 116* 133* 118* 111* 170* 105*  BUN 19  < > 21*  < > 17  < > 16  < > 18 20  --  15 20 16   CREATININE 0.85  < > 0.89  < > 0.84  < > 0.74  < > 0.73 0.80  --  0.87 0.94 0.84  CALCIUM 8.6*  --  8.8*  --  8.6*  --  8.9  --  8.7*  --   --  8.8* 8.8* 8.6*  MG 2.3  --  2.3  --  2.1  --  1.8  --  2.2  --   --   --   --   --   < > =  values in this interval not displayed.  Liver Function Tests:  Recent Labs Lab 09/04/15 0148 09/05/15 0447 09/06/15 0350 09/07/15 0345 09/08/15 0435  AST 33 ALT ALKPHOS 52 49 54 51 56  BILITOT 0.9 1.1 1.1 1.3* 1.0  PROT 5.3* 5.7* 5.3* 5.2* 5.3*  ALBUMIN 3.0* 2.9* 2.8* 2.6* 2.5*   No results for input(s): LIPASE, AMYLASE in the last 168 hours.  Recent Labs Lab 09/05/15 1100  AMMONIA 16    CBC:  Recent Labs Lab 09/02/15 0404  09/03/15 0345  09/04/15 0148  09/05/15 0447  09/06/15 0350 09/06/15 1634 09/06/15 2224 09/07/15 0345 09/08/15 0435  WBC 7.8  --  5.9  --  5.7  --  5.8  --  7.0  --   --  6.5 6.7  NEUTROABS 6.7  --  4.6  --  4.1  --  4.3  --   --   --   --   --   --   HGB 7.8*  < > 9.0*  < > 9.6*  < > 9.0*  < > 9.0* 9.2* 8.5* 8.7* 8.6*  HCT 25.8*  < > 29.1*  < > 31.1*  < > 29.1*  < > 29.1* 27.0* 25.0* 28.0* 27.7*  MCV 84.9  --  84.6  --  85.2  --  86.1  --  88.2  --   --  86.7 86.0  PLT 115*  --  113*  --  132*  --  145*  --  160  --   --  167 195  < > = values in this interval not displayed.  Cardiac Enzymes: No results for input(s): CKTOTAL, CKMB, CKMBINDEX, TROPONINI in the last 168 hours.  BNP: BNP (last 3 results)  Recent Labs  05/13/15 2248 08/21/15 1345 08/31/15 0400  BNP 1235.6* 2132.9* 1387.0*    ProBNP (last 3 results) No results for  input(s): PROBNP in the last 8760 hours.    Other results:  Imaging: Dg Chest Port 1 View  09/08/2015  CLINICAL DATA:  LVAD (left ventricular assist device) present EXAM: PORTABLE CHEST - 1 VIEW COMPARISON:  the previous day's study FINDINGS: Bibasilar consolidation/atelectasis left greater than right. Central pulmonary vascular congestion and probable mild interstitial edema, stable. Probable pleural effusions left greater than right. Right arm PICC, left subclavian AICD, and visualized LVAD components stable position. Previous median sternotomy. Cardiomegaly stable. IMPRESSION: 1. Asymmetric infiltrates/edema and probable effusions, stable 2.  Support hardware stable in position. Electronically Signed   By: Corlis Leak M.D.   On: 09/08/2015 08:28   Dg Chest Port 1 View  09/07/2015  CLINICAL DATA:  Left ventricular assist device EXAM: PORTABLE CHEST 1 VIEW COMPARISON:  09/06/2015 FINDINGS: Cardiac shadow remains enlarged. The LVAD is not well visualized on this examination. A defibrillator is again seen. Increased density is noted in the bases bilaterally left greater than right stable from the prior exam. A right-sided PICC line is again identified and stable. IMPRESSION: Persistent bibasilar changes left greater than right. The overall appearance is stable from the prior exam. Electronically Signed   By: Alcide Clever M.D.   On: 09/07/2015 07:17     Medications:     Scheduled Medications: . albumin human  12.5 g Intravenous Q6H  . amiodarone  400 mg Oral BID  . antiseptic oral rinse  7 mL Mouth Rinse q12n4p  . aspirin EC  81 mg Oral Daily  . bisacodyl  10 mg Oral Daily   Or  . bisacodyl  10 mg Rectal Daily  . chlorhexidine  15 mL Mouth Rinse BID  . feeding supplement (ENSURE ENLIVE)  237 mL Oral TID BM  . ferrous fumarate-b12-vitamic C-folic acid  1 capsule Oral TID PC  . furosemide  40 mg Oral Daily  . mirtazapine  15 mg Oral QHS  . OLANZapine  5 mg Oral QHS  . pantoprazole  40 mg  Oral Daily  . potassium chloride  40 mEq Oral Once  . sodium chloride flush  10-40 mL Intracatheter Q12H  . sodium chloride flush  3 mL Intravenous Q12H  . warfarin  5 mg Oral ONCE-1800  . Warfarin - Pharmacist Dosing Inpatient   Does not apply q1800  . zolpidem  5 mg Oral QHS    Infusions: . sodium chloride Stopped (08/31/15 1700)  . sodium chloride 250 mL (08/31/15 0540)  . sodium chloride 10 mL/hr at 08/30/15 1500  . lactated ringers Stopped (08/30/15 1500)  . lactated ringers Stopped (09/02/15 1200)  . milrinone 0.25 mcg/kg/min (09/08/15 0900)    PRN Medications: sodium chloride, antiseptic oral rinse, docusate sodium, ondansetron (ZOFRAN) IV, polyvinyl alcohol, potassium chloride, sodium chloride flush, sodium chloride flush, traMADol   Assessment:   1. 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 EF 15%. Cath 8/14 with normal cors. 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  2. Frequent PVCs 3. Severe Malnutrition- Prealbumin 14.7 on 6/20 4. Acute post-op delirium/sundowning 5. Hypokalemia 6. Constipation   Plan/Discussion:    POD 9 s/p HM II LVAD.  Confusion much improved. Continue Zyprexa and ambulation.   Remains on milrinone 0.25 mcg.  Co-ox 59%. Can possibly start weaning milrinone soon. Very good diuresis yesterday, CVP down to 2-3. Stop lasix. Getting albumin per Dr. Donata Clay  Decrease amio to 200 mg po twice a day.   VAD parameters stable. Off heparin. INR 1.95 On coumadin. On 81 mg aspirin.    Supp K+  Give sorbitol for constipation.   D/w Dr. Donata Clay.   Length of Stay: 18 Arvilla Meres MD 09/08/2015, 10:46 AM
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
## 2015-09-08 NOTE — Progress Notes (Signed)
ANTICOAGULATION CONSULT NOTE - Follow Up Consult  Pharmacy Consult for Warfarin  Indication: LVAD  Allergies  Allergen Reactions  . Phenergan [Promethazine Hcl] Nausea And Vomiting  . Lasix [Furosemide] Other (See Comments)    "Dropped blood pressure too low"  . Morphine And Related Other (See Comments)    Goes crazy     Patient Measurements: Height: 6\' 2"  (188 cm) Weight: 168 lb 10.4 oz (76.5 kg) IBW/kg (Calculated) : 82.2   Vital Signs: Temp: 99.5 F (37.5 C) (07/02 0336) Temp Source: Oral (07/02 0336) Pulse Rate: 89 (07/02 0700)  Labs:  Recent Labs  09/06/15 0350  09/06/15 2224 09/07/15 0345 09/07/15 1140 09/08/15 0435  HGB 9.0*  < > 8.5* 8.7*  --  8.6*  HCT 29.1*  < > 25.0* 28.0*  --  27.7*  PLT 160  --   --  167  --  195  LABPROT 26.7*  --   --  25.4*  --  22.1*  INR 2.50*  --   --  2.34*  --  1.95*  CREATININE 0.73  < >  --  0.87 0.94 0.84  < > = values in this interval not displayed.  Estimated Creatinine Clearance: 84.7 mL/min (by C-G formula based on Cr of 0.84).  Assessment: 74yom with HF.  S/p new LVAD placement.  Remains in ICU with delerium.  INR 1.95  (declining).   H/H low stable PLTC stable.  Remains on amiodarone po - started preop - for NSVT  Goal of Therapy:  INR 2-2.5 Monitor platelets by anticoagulation protocol: Yes   Plan:  Warfarin 5 mg x1 Daily INR Monitor for s/s bleeding   Tad Moore, BCPS  Clinical Pharmacist Pager [phone]  09/08/2015 8:09 AM

## 2015-09-09 ENCOUNTER — Encounter (HOSPITAL_COMMUNITY): Payer: Self-pay | Admitting: Cardiology

## 2015-09-09 ENCOUNTER — Inpatient Hospital Stay (HOSPITAL_COMMUNITY): Payer: Medicare Other

## 2015-09-09 DIAGNOSIS — I5023 Acute on chronic systolic (congestive) heart failure: Secondary | ICD-10-CM

## 2015-09-09 DIAGNOSIS — R339 Retention of urine, unspecified: Secondary | ICD-10-CM

## 2015-09-09 DIAGNOSIS — Z95811 Presence of heart assist device: Secondary | ICD-10-CM

## 2015-09-09 LAB — COMPREHENSIVE METABOLIC PANEL
ALT: 31 U/L (ref 17–63)
AST: 31 U/L (ref 15–41)
Albumin: 3.2 g/dL — ABNORMAL LOW (ref 3.5–5.0)
Alkaline Phosphatase: 56 U/L (ref 38–126)
Anion gap: 18 — ABNORMAL HIGH (ref 5–15)
BUN: 12 mg/dL (ref 6–20)
CO2: 25 mmol/L (ref 22–32)
Calcium: 10 mg/dL (ref 8.9–10.3)
Chloride: 95 mmol/L — ABNORMAL LOW (ref 101–111)
Creatinine, Ser: 0.81 mg/dL (ref 0.61–1.24)
GFR calc Af Amer: >60 mL/min (ref >60)
GFR calc non Af Amer: >60 mL/min (ref >60)
Glucose, Bld: 147 mg/dL — ABNORMAL HIGH (ref 65–99)
Potassium: 3.6 mmol/L (ref 3.5–5.1)
Sodium: 138 mmol/L (ref 135–145)
Total Bilirubin: 1.3 mg/dL — ABNORMAL HIGH (ref 0.3–1.2)
Total Protein: 6.1 g/dL — ABNORMAL LOW (ref 6.5–8.1)

## 2015-09-09 LAB — CBC
HCT: 28 % — ABNORMAL LOW (ref 39.0–52.0)
Hemoglobin: 8.7 g/dL — ABNORMAL LOW (ref 13.0–17.0)
MCH: 26.7 pg (ref 26.0–34.0)
MCHC: 31.1 g/dL (ref 30.0–36.0)
MCV: 85.9 fL (ref 78.0–100.0)
Platelets: 209 10*3/uL (ref 150–400)
RBC: 3.26 MIL/uL — ABNORMAL LOW (ref 4.22–5.81)
RDW: 17.3 % — ABNORMAL HIGH (ref 11.5–15.5)
WBC: 9.1 10*3/uL (ref 4.0–10.5)

## 2015-09-09 LAB — GLUCOSE, CAPILLARY
GLUCOSE-CAPILLARY: 134 mg/dL — AB (ref 65–99)
GLUCOSE-CAPILLARY: 128 mg/dL — AB (ref 65–99)
GLUCOSE-CAPILLARY: 113 mg/dL — AB (ref 65–99)
Glucose-Capillary: 124 mg/dL — ABNORMAL HIGH (ref 65–99)
Glucose-Capillary: 124 mg/dL — ABNORMAL HIGH (ref 65–99)
Glucose-Capillary: 137 mg/dL — ABNORMAL HIGH (ref 65–99)

## 2015-09-09 LAB — PROTIME-INR
INR: 2.3 — ABNORMAL HIGH (ref 0.00–1.49)
Prothrombin Time: 25.1 seconds — ABNORMAL HIGH (ref 11.6–15.2)

## 2015-09-09 LAB — CARBOXYHEMOGLOBIN
CARBOXYHEMOGLOBIN: 1.9 % — AB (ref 0.5–1.5)
Methemoglobin: 0.9 % (ref 0.0–1.5)
O2 SAT: 57.8 %
Total hemoglobin: 8.7 g/dL — ABNORMAL LOW (ref 13.5–18.0)

## 2015-09-09 LAB — URINE CULTURE
Culture: NO GROWTH
Special Requests: NORMAL

## 2015-09-09 LAB — MAGNESIUM: Magnesium: 1.7 mg/dL (ref 1.7–2.4)

## 2015-09-09 LAB — LACTATE DEHYDROGENASE: LDH: 261 U/L — AB (ref 98–192)

## 2015-09-09 MED ORDER — MILRINONE LACTATE IN DEXTROSE 20-5 MG/100ML-% IV SOLN
0.1250 ug/kg/min | INTRAVENOUS | Status: DC
  Administered 2015-09-09 – 2015-09-13 (×6): 0.25 ug/kg/min via INTRAVENOUS
  Administered 2015-09-13: 0.125 ug/kg/min via INTRAVENOUS
  Filled 2015-09-09 (×7): qty 100

## 2015-09-09 MED ORDER — BOOST / RESOURCE BREEZE PO LIQD
1.0000 | Freq: Three times a day (TID) | ORAL | Status: DC
  Administered 2015-09-10 – 2015-09-18 (×14): 1 via ORAL

## 2015-09-09 MED ORDER — WARFARIN SODIUM 3 MG PO TABS
4.0000 mg | ORAL_TABLET | Freq: Once | ORAL | Status: AC
  Administered 2015-09-09: 4 mg via ORAL
  Filled 2015-09-09: qty 0.5

## 2015-09-09 MED ORDER — TAMSULOSIN HCL 0.4 MG PO CAPS
0.4000 mg | ORAL_CAPSULE | Freq: Every day | ORAL | Status: DC
  Administered 2015-09-09 – 2015-09-19 (×11): 0.4 mg via ORAL
  Filled 2015-09-09 (×11): qty 1

## 2015-09-09 MED ORDER — POTASSIUM CHLORIDE 10 MEQ/50ML IV SOLN
10.0000 meq | INTRAVENOUS | Status: AC | PRN
  Administered 2015-09-09 (×3): 10 meq via INTRAVENOUS

## 2015-09-09 MED ORDER — MAGNESIUM SULFATE 2 GM/50ML IV SOLN
2.0000 g | Freq: Once | INTRAVENOUS | Status: AC
  Administered 2015-09-09: 2 g via INTRAVENOUS
  Filled 2015-09-09: qty 50

## 2015-09-09 NOTE — Progress Notes (Addendum)
MAPs elevated 105-110s. All other vitals and VAD numbers unchanged. Pain treated with 50 mg Ultram at 0320 with pain relief, but no BP relief. No PRN BP medication ordered. Paged Dr. Jearld Pies per VAD coordinator on-call x 3 with no response. Dr. Donata Clay paged [phone], awaiting response. MAP checked q30 mins for 2 hours. MAP down to 90 at 0500. Will continue to monitor pt.

## 2015-09-09 NOTE — Progress Notes (Signed)
09/09/2015 RN notified pharmacy of weight on MAR for milrinone dosing different from pt. Current weight. Pharmacy to update rate on milrinone. Will continue to closely monitor patient.  Esmerelda Finnigan, Blanchard Kelch

## 2015-09-09 NOTE — Progress Notes (Signed)
09/09/2015 0800 Pt. Stating severe abdominal discomfort and inability to void this am. Per night shift RN pt. Required I & O cath x 2 last pm. Bladder scan performed showing approximately 620 ml residual urine in bladder. Dr. Clarise Cruz on floor and verbal order received ok to replace foley at this time. Orders enacted. After placement, pt. States resolution of abdominal pain. Will continue to closely monitor patient.  Euline Kimbler, Blanchard Kelch

## 2015-09-09 NOTE — Clinical Social Work Note (Signed)
CSW continuing to follow for support and discharge needs. Shonny Kesa Birky, LCSW [phone] 

## 2015-09-09 NOTE — Progress Notes (Addendum)
Nutrition Follow-up  DOCUMENTATION CODES:   Severe malnutrition in context of chronic illness  INTERVENTION:   D/C Ensure Enlive   Boost Breeze po TID, each supplement provides 250 kcal and 9 grams of protein   Recommend CORTRAK small bore tube placement for nocturnal supplemental feeding  NUTRITION DIAGNOSIS:   Malnutrition related to chronic illness as evidenced by severe depletion of body fat, severe depletion of muscle mass, energy intake < or equal to 75% for > or equal to 1 month, ongoing  GOAL:   Patient will meet greater than or equal to 90% of their needs, unmet  MONITOR:   PO intake, Supplement acceptance, Labs, Weight trends, I & O's (TF re-initiation)  ASSESSMENT:   75 y/o with LMNA cardiomyopathy admitted 6/14 with low output HF Underwent cath 6/15. Normal cors. EF 10% with low cardiac output. Has diuresed 20 pounds. Feels better. Co-ox remains low. Still with frequent ectopy   Patient s/p procedure 6/23: INSERTION OF IMPLANTABLE LEFT VENTRICULAR ASSIST DEVICE  Pt extubated 6/24. Patient sleeping; wife at bedside >> states pt not eating. Per RN, doesn't like Ensure Enlive >> will D/C. Malnutrition ongoing >> will order Boost Breeze to see if he will take. Palliative Care Team note 6/29 reviewed.  Diet Order:  Diet regular Room service appropriate?: Yes; Fluid consistency:: Thin  Skin:  Wound (see comment) (surgical chest incision )  Last BM:  N/A  Height:   Ht Readings from Last 1 Encounters:  08/21/15 6\' 2"  (1.88 m)    Weight:   Wt Readings from Last 1 Encounters:  09/09/15 170 lb 6.7 oz (77.3 kg)    Ideal Body Weight:  86.36 kg  BMI:  Body mass index is 21.87 kg/(m^2).  Estimated Nutritional Needs:   Kcal:  2000-2200  Protein:  100-115 grams  Fluid:  Per MD  EDUCATION NEEDS:   No education needs identified at this time  Maureen Chatters, RD, LDN Pager #: [phone] After-Hours Pager #: [phone]

## 2015-09-09 NOTE — Progress Notes (Signed)
Patient ID: [name], male   DOB: [birth year], 75 y.o.   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 Normal cors. EF 10% with low cardiac output.   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    Remains on milrinone 0.25 mcg. CO-OX 58%. CVP 7-8.  MAP 90-100s.     More awake. C/o constipation. Urinary retention..   Flow: 5.2 L/min  Speed: 9000 PI 7.0  Power 5.0  No PI events.   Objective:   Vital Signs: MAPs 80s   Temp:  [97.9 F (36.6 C)-98.5 F (36.9 C)] 98.5 F (36.9 C) (07/03 0749) Pulse Rate:  [73-102] 88 (07/03 0600) Resp:  [18-37] 22 (07/03 0600) SpO2:  [90 %-100 %] 100 % (07/03 0600) Weight:  [170 lb 6.7 oz (77.3 kg)] 170 lb 6.7 oz (77.3 kg) (07/03 0600) Last BM Date: 09/09/15 (small bowel movement and multiple smears)  Weight change: Filed Weights   09/07/15 0500 09/08/15 0600 09/09/15 0600  Weight: 170 lb 6.7 oz (77.3 kg) 168 lb 10.4 oz (76.5 kg) 170 lb 6.7 oz (77.3 kg)    Intake/Output:   Intake/Output Summary (Last 24 hours) at 09/09/15 0850 Last data filed at 09/09/15 0604  Gross per 24 hour  Intake    897 ml  Output   1545 ml  Net   -648 ml    MAPS  90-100  Physical Exam: CVP  7-8 General: In the chair.  NAD. Sitter at bedside HEENT: normal  Neck: supple. RIJ TLC. JVP flat cm Cor:  LVAD hum.  Lungs: Decreased breath sounds at bases bilaterally.  Abdomen: soft, mildly distended. No hepatosplenomegaly. Hypoactive bowel sounds. Driveline site ok. Securement device in place.  Extremities: no cyanosis, clubbing, rash, no edema Neuro: Orient to self and place.  Non focal    Telemetry:  Sinus with PVCs  Labs: Basic Metabolic Panel:  Recent Labs Lab 09/03/15 0345  09/04/15 0148  09/05/15 0447  09/06/15 0350 09/06/15 1634 09/06/15 2224 09/07/15 0345 09/07/15 1140 09/08/15 0435 09/09/15 0300  NA 133*  < > 136  < > 136  < > 136 136 136 134* 133*  135 138  K 3.4*  < > 3.6  < > 3.7  < > 3.6 3.8 3.5 5.6* 3.7 3.4* 3.6  CL 95*  < > 98*  < > 97*  < > 100* 95*  --  100* 99* 98* 95*  CO2 30  --  29  --  30  --  28  --   --  27 27 27 25   GLUCOSE 112*  < > 102*  < > 111*  < > 116* 133* 118* 111* 170* 105* 147*  BUN 21*  < > 17  < > 16  < > 18 20  --  15 20 16 12   CREATININE 0.89  < > 0.84  < > 0.74  < > 0.73 0.80  --  0.87 0.94 0.84 0.81  CALCIUM 8.8*  --  8.6*  --  8.9  --  8.7*  --   --  8.8* 8.8* 8.6* 10.0  MG 2.3  --  2.1  --  1.8  --  2.2  --   --   --   --   --  1.7  < > = values in this interval not displayed.  Liver Function Tests:  Recent Labs  Lab 09/05/15 0447 09/06/15 0350 09/07/15 0345 09/08/15 0435 09/09/15 0300  AST 30 26 28 31 31   ALT 25 25 26 28 31   ALKPHOS 49 54 51 56 56  BILITOT 1.1 1.1 1.3* 1.0 1.3*  PROT 5.7* 5.3* 5.2* 5.3* 6.1*  ALBUMIN 2.9* 2.8* 2.6* 2.5* 3.2*   No results for input(s): LIPASE, AMYLASE in the last 168 hours.  Recent Labs Lab 09/05/15 1100  AMMONIA 16    CBC:  Recent Labs Lab 09/03/15 0345  09/04/15 0148  09/05/15 0447  09/06/15 0350 09/06/15 1634 09/06/15 2224 09/07/15 0345 09/08/15 0435 09/09/15 0300  WBC 5.9  --  5.7  --  5.8  --  7.0  --   --  6.5 6.7 9.1  NEUTROABS 4.6  --  4.1  --  4.3  --   --   --   --   --   --   --   HGB 9.0*  < > 9.6*  < > 9.0*  < > 9.0* 9.2* 8.5* 8.7* 8.6* 8.7*  HCT 29.1*  < > 31.1*  < > 29.1*  < > 29.1* 27.0* 25.0* 28.0* 27.7* 28.0*  MCV 84.6  --  85.2  --  86.1  --  88.2  --   --  86.7 86.0 85.9  PLT 113*  --  132*  --  145*  --  160  --   --  167 195 209  < > = values in this interval not displayed.  Cardiac Enzymes: No results for input(s): CKTOTAL, CKMB, CKMBINDEX, TROPONINI in the last 168 hours.  BNP: BNP (last 3 results)  Recent Labs  05/13/15 2248 08/21/15 1345 08/31/15 0400  BNP 1235.6* 2132.9* 1387.0*    ProBNP (last 3 results) No results for input(s): PROBNP in the last 8760 hours.    Other results:  Imaging: Dg  Chest Port 1 View  09/09/2015  CLINICAL DATA:  75 year old male with LVAD placed 08/30/2015. Heart failure. Initial encounter. EXAM: PORTABLE CHEST 1 VIEW COMPARISON:  09/08/2015 and earlier. FINDINGS: Portable AP semi upright view at at 0617 hours. Stable right PICC line. Stable left chest cardiac AICD. Stable visible LVAD hardware. Stable cardiac size and mediastinal contours. Dense opacity at the left lung base and veiling opacity throughout much of the left lung compatible with left pleural effusion and partial left lung collapse. No superimposed acute pulmonary edema or pneumothorax identified. Ventilation has not 09/04/2015. IMPRESSION: 1.  Stable lines and support devices. 2. Ventilation not significantly changed since 09/04/2015. Left pleural effusion with compressive left lung atelectasis suspected. Electronically Signed   By: Odessa Fleming M.D.   On: 09/09/2015 07:32   Dg Chest Port 1 View  09/08/2015  CLINICAL DATA:  LVAD (left ventricular assist device) present EXAM: PORTABLE CHEST - 1 VIEW COMPARISON:  the previous day's study FINDINGS: Bibasilar consolidation/atelectasis left greater than right. Central pulmonary vascular congestion and probable mild interstitial edema, stable. Probable pleural effusions left greater than right. Right arm PICC, left subclavian AICD, and visualized LVAD components stable position. Previous median sternotomy. Cardiomegaly stable. IMPRESSION: 1. Asymmetric infiltrates/edema and probable effusions, stable 2.  Support hardware stable in position. Electronically Signed   By: Corlis Leak M.D.   On: 09/08/2015 08:28     Medications:     Scheduled Medications: . amiodarone  200 mg Oral BID  . antiseptic oral rinse  7 mL Mouth Rinse q12n4p  . aspirin EC  81 mg Oral Daily  . bisacodyl  10  mg Oral Daily   Or  . bisacodyl  10 mg Rectal Daily  . chlorhexidine  15 mL Mouth Rinse BID  . feeding supplement (ENSURE ENLIVE)  237 mL Oral TID BM  . ferrous fumarate-b12-vitamic  C-folic acid  1 capsule Oral TID PC  . mirtazapine  15 mg Oral QHS  . OLANZapine  5 mg Oral QHS  . pantoprazole  40 mg Oral Daily  . sodium chloride flush  10-40 mL Intracatheter Q12H  . sodium chloride flush  3 mL Intravenous Q12H  . Warfarin - Pharmacist Dosing Inpatient   Does not apply q1800  . zolpidem  5 mg Oral QHS    Infusions: . sodium chloride Stopped (08/31/15 1700)  . sodium chloride 250 mL (08/31/15 0540)  . sodium chloride 10 mL/hr (09/08/15 1609)  . lactated ringers Stopped (08/30/15 1500)  . lactated ringers Stopped (09/02/15 1200)  . milrinone 0.25 mcg/kg/min (09/09/15 0600)    PRN Medications: sodium chloride, antiseptic oral rinse, docusate sodium, ondansetron (ZOFRAN) IV, polyvinyl alcohol, potassium chloride, sodium chloride flush, sodium chloride flush, traMADol   Assessment:   1. 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 EF 15%. Cath 8/14 with normal cors. 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  2. Frequent PVCs 3. Severe Malnutrition- Prealbumin 14.7 on 6/20 4. Acute post-op delirium/sundowning 5. Hypokalemia 6. Constipation 7. Urinary Retention    Plan/Discussion:    S/P HM II LVAD.  Confusion much improved. Continue Zyprexa and ambulation.   Remains on milrinone 0.25 mcg.  Co-ox 58%. Can possibly start weaning milrinone soon. CVP 7-8 . Off diuretics.  MAPS high but with urinary retention will re-assess later today.   Continue  amio to 200 mg po twice a day.   VAD parameters stable. Off heparin. INR  2.3. On coumadin. On 81 mg aspirin.    Supp K+  Give sorbitol for constipation.  Urinary retention- 625 cc urine in bladder. Place foley.    Length of Stay: 19 Amy Clegg Np-C  09/09/2015, 8:50 AM  Patient seen and examined with Tonye Becket, NP. We discussed all aspects of the encounter. I agree with the assessment and plan as stated above.   He is improving slowly but still tenuous. Confusion wax and wanes but definitely  improved with Zyprexa. This am he has acute urinary retention and will place Foley and start Flomax. Give sorbitol for constipation.  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 Co-ox marginal. Will continue for now. Possible start wean tomorrow. CVP looks good. Hold lasix.   Continue to work with PT and ambulate. Encourage oral intake.   Will likely be slow recovery. Agree with Dr. Donata Clay that CIR may be very good option.   VAD parameters look ok.   Bensimhon, Daniel,MD 7:03 PM

## 2015-09-09 NOTE — Care Management Important Message (Signed)
Important Message  Patient Details  Name: [name] MRN: [id number] Date of Birth: March 17, 1940   Medicare Important Message Given:  Yes    Kyla Balzarine 09/09/2015, 10:28 AM
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
## 2015-09-09 NOTE — Progress Notes (Signed)
Physical Therapy Treatment Patient Details Name: [name] MRN: [id number] DOB: 02-Apr-1940 Today's Date: 09/09/2015    History of Present Illness Pt is a 75 y/o M who was admitted on 08/21/2015 from [facility] with acute on chronic systolic heart failure. He is undergoing preparation this week for an LVAD placement (scheduled for 08/30/15). Pt's PMH inlcudes frequent PVCs, nonischemic cardiomyopathy, automatic ICD. Pt had LVAD placed 6/23.    PT Comments    Pt admitted with above diagnosis. Pt currently with functional limitations due to balance, endurance and cognitive deficits.Pt was able to ambulate on unit with max cues and max assist at times pushing wheelchair needing 3 person assist with chair follow for safety. Wife present. Will follow acutely.  Pt will benefit from skilled PT to increase their independence and safety with mobility to allow discharge to the venue listed below.    Follow Up Recommendations  CIR     Equipment Recommendations  None recommended by PT    Recommendations for Other Services OT consult     Precautions / Restrictions Precautions Precautions: Sternal Precaution Comments: h/o frequent PVCs; provided pt w/ and reviewed sternal precaution handout Restrictions Weight Bearing Restrictions: Yes (sternal precautions) Other Position/Activity Restrictions: sternal precautions    Mobility  Bed Mobility Overal bed mobility: Needs Assistance Bed Mobility: Sit to Supine       Sit to supine: Max assist;+2 for physical assistance   General bed mobility comments: Needed assist for trunk and for LEs to get into bed.   Transfers Overall transfer level: Needs assistance Equipment used: Pushed w/c Transfers: Sit to/from Stand Sit to Stand: +2 physical assistance;Max assist         General transfer comment: Poor transitional movements. Difficulty achieving midline orientation. Max physical cues to sit. Pt feels that he is  falling.  Ambulation/Gait Ambulation/Gait assistance: Mod assist;Max assist;+2 physical assistance Ambulation Distance (Feet): 380 Feet (180 feet then 200 feet) Assistive device:  (pushed wheelchair) Gait Pattern/deviations: Step-through pattern;Decreased stride length;Drifts right/left;Trunk flexed;Leaning posteriorly Gait velocity: decr Gait velocity interpretation: Below normal speed for age/gender General Gait Details: Pt at times leans too far anteriorly and other times posteriorly.  Alot of upper body postural issues with finding COG which requires mod to max assist for balance.  Pt had several significant LOB needing assist to recover.  Needed tactile and verbal cues.  Needed chair follow.  Stairs            Wheelchair Mobility    Modified Rankin (Stroke Patients Only)       Balance Overall balance assessment: Needs assistance Sitting-balance support: Feet supported;No upper extremity supported Sitting balance-Leahy Scale: Poor Sitting balance - Comments: Min A to sit edge of bed Postural control: Posterior lean;Other (comment) (anterior lean) Standing balance support: Bilateral upper extremity supported;During functional activity Standing balance-Leahy Scale: Zero Standing balance comment: Needed mod to max assist for balance. Heavy lean anterior and posterior at times.              High level balance activites: Backward walking;Direction changes;Turns;Sudden stops;Head turns High Level Balance Comments: Instability and min assist provided with  turns and direction changes as well as backward walking.     Cognition Arousal/Alertness: Lethargic Behavior During Therapy: Restless;Impulsive;Flat affect Overall Cognitive Status: Impaired/Different from baseline Area of Impairment: Orientation;Attention;Memory;Following commands;Safety/judgement;Awareness;Problem solving Orientation Level: Disoriented to;Place;Time;Situation Current Attention Level:  Focused Memory: Decreased recall of precautions;Decreased short-term memory Following Commands: Follows one step commands inconsistently Safety/Judgement: Decreased awareness of safety;Decreased awareness  of deficits Awareness: Intellectual Problem Solving: Slow processing;Decreased initiation;Difficulty sequencing;Requires verbal cues;Requires tactile cues General Comments: Apparent difficulty with motor planning    Exercises      General Comments General comments (skin integrity, edema, etc.): VSS with PI from 7.0-4.6 with activity with pt asymptomatic.      Pertinent Vitals/Pain Pain Assessment: No/denies pain  PI 7.0 down to 4.6 with activity. Other VSS.     Home Living                      Prior Function            PT Goals (current goals can now be found in the care plan section) Progress towards PT goals: Progressing toward goals    Frequency  Min 3X/week    PT Plan Current plan remains appropriate    Co-evaluation             End of Session Equipment Utilized During Treatment: Gait belt Activity Tolerance: Patient limited by fatigue (limited by cognition) Patient left: in bed;with call bell/phone within reach;with nursing/sitter in room;with bed alarm set;with family/visitor present     Time: 1343-1415 PT Time Calculation (min) (ACUTE ONLY): 32 min  Charges:  $Gait Training: 23-37 mins                    G CodesBerline Lopes Sep 22, 2015, 2:43 PM [employer] [facility] [phone] (pager)

## 2015-09-09 NOTE — Progress Notes (Signed)
ANTICOAGULATION CONSULT NOTE - Follow Up Consult  Pharmacy Consult for Warfarin  Indication: LVAD  Allergies  Allergen Reactions  . Phenergan [Promethazine Hcl] Nausea And Vomiting  . Lasix [Furosemide] Other (See Comments)    "Dropped blood pressure too low"  . Morphine And Related Other (See Comments)    Goes crazy     Patient Measurements: Height: 6\' 2"  (188 cm) Weight: 170 lb 6.7 oz (77.3 kg) IBW/kg (Calculated) : 82.2   Vital Signs: Temp: 98.5 F (36.9 C) (07/03 0749) Temp Source: Oral (07/03 0749) Pulse Rate: 88 (07/03 0600)  Labs:  Recent Labs  09/07/15 0345 09/07/15 1140 09/08/15 0435 09/09/15 0300 09/09/15 0330  HGB 8.7*  --  8.6* 8.7*  --   HCT 28.0*  --  27.7* 28.0*  --   PLT 167  --  195 209  --   LABPROT 25.4*  --  22.1*  --  25.1*  INR 2.34*  --  1.95*  --  2.30*  CREATININE 0.87 0.94 0.84 0.81  --     Estimated Creatinine Clearance: 88.8 mL/min (by C-G formula based on Cr of 0.81).  Assessment: 74yom with HF.  S/p new LVAD placement.   INR trending up this am to 2.3, no issues noted overnight  H/H low stable PLTC stable.  Remains on amiodarone po - started preop - for NSVT  Goal of Therapy:  INR 2-2.5 Monitor platelets by anticoagulation protocol: Yes   Plan:  Warfarin 4mg  x1 Daily INR Monitor for s/s bleeding   Sheppard Coil PharmD., BCPS Clinical Pharmacist Pager [phone] 09/09/2015 10:21 AM

## 2015-09-09 NOTE — Progress Notes (Signed)
Orthopedic Tech Progress Note Patient Details:  [name] 1940/10/20 [id number]  Ortho Devices Type of Ortho Device: Abdominal binder Ortho Device/Splint Interventions: Casandra Doffing 09/09/2015, 5:44 PM

## 2015-09-10 ENCOUNTER — Inpatient Hospital Stay (HOSPITAL_COMMUNITY): Payer: Medicare Other

## 2015-09-10 LAB — CULTURE, BLOOD (ROUTINE X 2)
CULTURE: NO GROWTH
CULTURE: NO GROWTH

## 2015-09-10 LAB — GLUCOSE, CAPILLARY
GLUCOSE-CAPILLARY: 120 mg/dL — AB (ref 65–99)
GLUCOSE-CAPILLARY: 150 mg/dL — AB (ref 65–99)
GLUCOSE-CAPILLARY: 210 mg/dL — AB (ref 65–99)
GLUCOSE-CAPILLARY: 97 mg/dL (ref 65–99)
Glucose-Capillary: 110 mg/dL — ABNORMAL HIGH (ref 65–99)
Glucose-Capillary: 112 mg/dL — ABNORMAL HIGH (ref 65–99)
Glucose-Capillary: 141 mg/dL — ABNORMAL HIGH (ref 65–99)

## 2015-09-10 LAB — COMPREHENSIVE METABOLIC PANEL
ALT: 28 U/L (ref 17–63)
AST: 28 U/L (ref 15–41)
Albumin: 2.9 g/dL — ABNORMAL LOW (ref 3.5–5.0)
Alkaline Phosphatase: 53 U/L (ref 38–126)
Anion gap: 6 (ref 5–15)
BUN: 8 mg/dL (ref 6–20)
CO2: 26 mmol/L (ref 22–32)
Calcium: 8.8 mg/dL — ABNORMAL LOW (ref 8.9–10.3)
Chloride: 105 mmol/L (ref 101–111)
Creatinine, Ser: 0.8 mg/dL (ref 0.61–1.24)
GFR calc Af Amer: >60 mL/min (ref >60)
GFR calc non Af Amer: >60 mL/min (ref >60)
Glucose, Bld: 132 mg/dL — ABNORMAL HIGH (ref 65–99)
Potassium: 3.8 mmol/L (ref 3.5–5.1)
Sodium: 137 mmol/L (ref 135–145)
Total Bilirubin: 1 mg/dL (ref 0.3–1.2)
Total Protein: 5.8 g/dL — ABNORMAL LOW (ref 6.5–8.1)

## 2015-09-10 LAB — PROTIME-INR
INR: 3.28 — AB (ref 0.00–1.49)
PROTHROMBIN TIME: 32.7 s — AB (ref 11.6–15.2)

## 2015-09-10 LAB — CARBOXYHEMOGLOBIN
Carboxyhemoglobin: 1.9 % — ABNORMAL HIGH (ref 0.5–1.5)
Carboxyhemoglobin: 2.3 % — ABNORMAL HIGH (ref 0.5–1.5)
METHEMOGLOBIN: 0.7 % (ref 0.0–1.5)
Methemoglobin: 0.5 % (ref 0.0–1.5)
O2 Saturation: 45.2 %
O2 Saturation: 68 %
TOTAL HEMOGLOBIN: 8.5 g/dL — AB (ref 13.5–18.0)
TOTAL HEMOGLOBIN: 8.7 g/dL — AB (ref 13.5–18.0)

## 2015-09-10 LAB — LACTATE DEHYDROGENASE: LDH: 242 U/L — AB (ref 98–192)

## 2015-09-10 LAB — CBC
HCT: 29 % — ABNORMAL LOW (ref 39.0–52.0)
Hemoglobin: 8.9 g/dL — ABNORMAL LOW (ref 13.0–17.0)
MCH: 26.6 pg (ref 26.0–34.0)
MCHC: 30.7 g/dL (ref 30.0–36.0)
MCV: 86.8 fL (ref 78.0–100.0)
Platelets: 243 10*3/uL (ref 150–400)
RBC: 3.34 MIL/uL — ABNORMAL LOW (ref 4.22–5.81)
RDW: 17.7 % — ABNORMAL HIGH (ref 11.5–15.5)
WBC: 8 10*3/uL (ref 4.0–10.5)

## 2015-09-10 NOTE — Progress Notes (Signed)
Occupational Therapy Treatment Patient Details Name: [name] MRN: [id number] DOB: [birth year] Today's Date: 09/10/2015    History of present illness Pt is a 75 y/o M who was admitted on 08/21/2015 from [facility] with acute on chronic systolic heart failure. He is undergoing preparation this week for an LVAD placement (scheduled for 08/30/15). Pt's PMH inlcudes frequent PVCs, nonischemic cardiomyopathy, automatic ICD. Pt had LVAD placed 6/23.   OT comments  Pt making slow, but steady progress.  He currently requires min A + 2 for functional mobility, and 3rd person for safety.  Confusion persists.  He requires mod - max A, overall for ADLs.    Follow Up Recommendations  CIR;Supervision/Assistance - 24 hour    Equipment Recommendations  None recommended by OT    Recommendations for Other Services Rehab consult    Precautions / Restrictions Precautions Precautions: Sternal;Other (comment) Precaution Comments: LVAD  Restrictions Weight Bearing Restrictions: Yes Other Position/Activity Restrictions: sternal precautions       Mobility Bed Mobility               General bed mobility comments: Pt sitting up in recliner   Transfers Overall transfer level: Needs assistance Equipment used: Rolling walker (2 wheeled) Transfers: Sit to/from [employer] Transfers Sit to Stand: Min assist;+2 physical assistance Stand pivot transfers: Min assist;+2 physical assistance       General transfer comment: Pt requires mod cues for LE placement, and feet blocked to prevent extension.  Requires mod cues for hand placement, and max A to maneuver walker     Balance Overall balance assessment: Needs assistance Sitting-balance support: Feet supported Sitting balance-Leahy Scale: Poor Sitting balance - Comments: Pt initially required min A to maintaine sitting EOC due to posterior lean, but progressed to min guard assist - is reliant on UE support  Postural  control: Posterior lean Standing balance support: Bilateral upper extremity supported Standing balance-Leahy Scale: Zero Standing balance comment: requires min A +2 progressing to min guard assist +2                   ADL Overall ADL's : Needs assistance/impaired Eating/Feeding: Moderate assistance                       Toilet Transfer: Minimal assistance;+2 for physical assistance;Stand-pivot;BSC;RW   Toileting- Clothing Manipulation and Hygiene: Moderate assistance;Sit to/from stand;+2 for safety/equipment       Functional mobility during ADLs: Minimal assistance;+2 for physical assistance;Rolling walker General ADL Comments: Pt requires increased time and mod - max cues for simple ADL tasks.  Confusion noted       Vision                     Perception     Praxis      Cognition   Behavior During Therapy: [employer] [facility] for tasks assessed/performed Overall Cognitive Status: Impaired/Different from baseline Area of Impairment: Orientation;Attention;Memory;Following commands;Safety/judgement;Problem solving Orientation Level: Disoriented to;Place;Time;Situation Current Attention Level: Sustained Memory: Decreased recall of precautions;Decreased short-term memory  Following Commands: Follows one step commands consistently;Follows one step commands with increased time Safety/Judgement: Decreased awareness of safety;Decreased awareness of deficits   Problem Solving: Slow processing;Decreased initiation;Difficulty sequencing;Requires verbal cues;Requires tactile cues General Comments: motor plannind deficits also noted     Extremity/Trunk Assessment               Exercises     Shoulder Instructions       General Comments  Pertinent Vitals/ Pain       Pain Assessment: No/denies pain  Home Living                                          Prior Functioning/Environment              Frequency Min 3X/week     Progress  Toward Goals  OT Goals(current goals can now be found in the care plan section)  Progress towards OT goals: Progressing toward goals  ADL Goals Pt Will Perform Upper Body Bathing: with set-up;with supervision;sitting Pt Will Perform Lower Body Bathing: sit to/from stand;with min assist Pt Will Perform Lower Body Dressing: with min assist;sit to/from stand Pt Will Transfer to Toilet: with min guard assist;bedside commode;stand pivot transfer Additional ADL Goal #1: Pt will demonstrte ability to manage LVAD equipmetn during ADL task with min vc  Plan Discharge plan remains appropriate    Co-evaluation    PT/OT/SLP Co-Evaluation/Treatment: Yes Reason for Co-Treatment: Complexity of the patient's impairments (multi-system involvement);For patient/therapist safety   OT goals addressed during session: ADL's and self-care      End of Session Equipment Utilized During Treatment: Rolling walker;Gait belt   Activity Tolerance Patient limited by fatigue;Other (comment) (cognition limits progression)   Patient Left in chair;with call bell/phone within reach;with family/visitor present;with nursing/sitter in room   Nurse Communication Mobility status        Time: 4098-1191 OT Time Calculation (min): 31 min  Charges: OT General Charges $OT Visit: 1 Procedure OT Treatments $Therapeutic Activity: 8-22 mins  Marlana Mckowen M 09/10/2015, 10:52 AM

## 2015-09-10 NOTE — Progress Notes (Addendum)
Patient ID: [name], male   DOB: 22-Sep-1940, 75 y.o.   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 Normal cors. EF 10% with low cardiac output.   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    Foley placed 7/3 for urinary retention.   Currently sleeping but did well with PT this am. Family says he is more feisty today but ca be beligerent at times. Remains on milrinone 0.25 mcg. CO-OX 58% -> 45% today. CVP 7-8.  MAP 90-100s.      PT Recommending CIR.   Having some power spikes on VAD. PI drops when lying on his side (was 2.7 when I walked in room but came up to 3.8). LDH stable. INR 3.2   Flow: 8.4 L/min  Speed: 9000 PI 3.2  Power 6.9  Rare PI events.   Objective:   Vital Signs: MAPs 80s   Temp:  [97.9 F (36.6 C)-99.1 F (37.3 C)] 98 F (36.7 C) (07/04 0754) Pulse Rate:  [78-95] 95 (07/04 0900) Resp:  [14-36] 14 (07/04 0900) BP: (97-101)/(78-87) 97/78 mmHg (07/03 1600) SpO2:  [95 %-100 %] 100 % (07/04 0900) Weight:  [75.3 kg (166 lb 0.1 oz)] 75.3 kg (166 lb 0.1 oz) (07/04 0800) Last BM Date: 09/09/15  Weight change: Filed Weights   09/08/15 0600 09/09/15 0600 09/10/15 0800  Weight: 76.5 kg (168 lb 10.4 oz) 77.3 kg (170 lb 6.7 oz) 75.3 kg (166 lb 0.1 oz)    Intake/Output:   Intake/Output Summary (Last 24 hours) at 09/10/15 1155 Last data filed at 09/10/15 1000  Gross per 24 hour  Intake  899.2 ml  Output   1590 ml  Net -690.8 ml    MAPS  80-90s  Physical Exam: CVP  7-8 General: In bed sleeping on his side  NAD. Sitter at bedside HEENT: normal  Neck: supple. RIJ TLC. JVP flat cm Cor:  LVAD hum.  Lungs: Decreased breath sounds at bases bilaterally.  Abdomen: soft, nondistended. No hepatosplenomegaly. Hypoactive bowel sounds. Driveline site ok. Securement device in place.  Extremities: no cyanosis, clubbing, rash, no edema Neuro: sleeping.  Non-focal   Telemetry:  Sinus with PVCs  Labs: Basic Metabolic Panel:  Recent Labs Lab 09/04/15 0148  09/05/15 0447  09/06/15 0350  09/07/15 0345 09/07/15 1140 09/08/15 0435 09/09/15 0300 09/10/15 0415  NA 136  < > 136  < > 136  < > 134* 133* 135 138 137  K 3.6  < > 3.7  < > 3.6  < > 5.6* 3.7 3.4* 3.6 3.8  CL 98*  < > 97*  < > 100*  < > 100* 99* 98* 95* 105  CO2 29  --  30  --  28  --  GLUCOSE 102*  < > 111*  < > 116*  < > 111* 170* 105* 147* 132*  BUN 17  < > 16  < > 18  < > CREATININE 0.84  < > 0.74  < > 0.73  < > 0.87 0.94 0.84 0.81 0.80  CALCIUM 8.6*  --  8.9  --  8.7*  --  8.8* 8.8* 8.6* 10.0 8.8*  MG 2.1  --  1.8  --  2.2  --   --   --   --  1.7  --   < > =  values in this interval not displayed.  Liver Function Tests:  Recent Labs Lab 09/06/15 0350 09/07/15 0345 09/08/15 0435 09/09/15 0300 09/10/15 0415  AST 26 28 31 31 28   ALT 25 26 28 31 28   ALKPHOS 54 51 56 56 53  BILITOT 1.1 1.3* 1.0 1.3* 1.0  PROT 5.3* 5.2* 5.3* 6.1* 5.8*  ALBUMIN 2.8* 2.6* 2.5* 3.2* 2.9*   No results for input(s): LIPASE, AMYLASE in the last 168 hours.  Recent Labs Lab 09/05/15 1100  AMMONIA 16    CBC:  Recent Labs Lab 09/04/15 0148  09/05/15 0447  09/06/15 0350  09/06/15 2224 09/07/15 0345 09/08/15 0435 09/09/15 0300 09/10/15 0415  WBC 5.7  --  5.8  --  7.0  --   --  6.5 6.7 9.1 8.0  NEUTROABS 4.1  --  4.3  --   --   --   --   --   --   --   --   HGB 9.6*  < > 9.0*  < > 9.0*  < > 8.5* 8.7* 8.6* 8.7* 8.9*  HCT 31.1*  < > 29.1*  < > 29.1*  < > 25.0* 28.0* 27.7* 28.0* 29.0*  MCV 85.2  --  86.1  --  88.2  --   --  86.7 86.0 85.9 86.8  PLT 132*  --  145*  --  160  --   --  167 195 209 243  < > = values in this interval not displayed.  Cardiac Enzymes: No results for input(s): CKTOTAL, CKMB, CKMBINDEX, TROPONINI in the last 168 hours.  BNP: BNP (last 3 results)  Recent Labs  05/13/15 2248 08/21/15 1345 08/31/15 0400  BNP 1235.6*  2132.9* 1387.0*    ProBNP (last 3 results) No results for input(s): PROBNP in the last 8760 hours.    Other results:  Imaging: Dg Chest Port 1 View  09/10/2015  CLINICAL DATA:  LVAD EXAM: PORTABLE CHEST 1 VIEW COMPARISON:  09/09/2015 FINDINGS: LVAD, left pacer, right PICC line remain in place, unchanged. Prior median sternotomy. Cardiomegaly with left lower lobe atelectasis or infiltrate. Suspect left effusion, stable. No pneumothorax. No confluent opacity on the right. IMPRESSION: Continued left lower lobe atelectasis or infiltrate with suspected left effusion. Stable cardiomegaly. Electronically Signed   By: Charlett Nose M.D.   On: 09/10/2015 07:55   Dg Chest Port 1 View  09/09/2015  CLINICAL DATA:  75 year old male with LVAD placed 08/30/2015. Heart failure. Initial encounter. EXAM: PORTABLE CHEST 1 VIEW COMPARISON:  09/08/2015 and earlier. FINDINGS: Portable AP semi upright view at at 0617 hours. Stable right PICC line. Stable left chest cardiac AICD. Stable visible LVAD hardware. Stable cardiac size and mediastinal contours. Dense opacity at the left lung base and veiling opacity throughout much of the left lung compatible with left pleural effusion and partial left lung collapse. No superimposed acute pulmonary edema or pneumothorax identified. Ventilation has not 09/04/2015. IMPRESSION: 1.  Stable lines and support devices. 2. Ventilation not significantly changed since 09/04/2015. Left pleural effusion with compressive left lung atelectasis suspected. Electronically Signed   By: Odessa Fleming M.D.   On: 09/09/2015 07:32     Medications:     Scheduled Medications: . amiodarone  200 mg Oral BID  . antiseptic oral rinse  7 mL Mouth Rinse q12n4p  . aspirin EC  81 mg Oral Daily  . bisacodyl  10 mg Oral Daily   Or  . bisacodyl  10 mg Rectal Daily  . chlorhexidine  15 mL Mouth Rinse BID  . feeding supplement  1 Container Oral TID BM  . ferrous fumarate-b12-vitamic C-folic acid  1 capsule Oral  TID PC  . mirtazapine  15 mg Oral QHS  . OLANZapine  5 mg Oral QHS  . pantoprazole  40 mg Oral Daily  . sodium chloride flush  10-40 mL Intracatheter Q12H  . sodium chloride flush  3 mL Intravenous Q12H  . tamsulosin  0.4 mg Oral Daily  . Warfarin - Pharmacist Dosing Inpatient   Does not apply q1800  . zolpidem  5 mg Oral QHS    Infusions: . sodium chloride Stopped (08/31/15 1700)  . sodium chloride 250 mL (08/31/15 0540)  . sodium chloride 10 mL/hr (09/08/15 1609)  . lactated ringers Stopped (08/30/15 1500)  . lactated ringers Stopped (09/02/15 1200)  . milrinone 0.25 mcg/kg/min (09/10/15 1032)    PRN Medications: sodium chloride, antiseptic oral rinse, docusate sodium, ondansetron (ZOFRAN) IV, polyvinyl alcohol, potassium chloride, sodium chloride flush, sodium chloride flush, traMADol   Assessment:   1. 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 EF 15%. Cath 8/14 with normal cors. 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  2. Frequent PVCs 3. Severe Malnutrition- Prealbumin 14.7 on 6/20 4. Acute post-op delirium/sundowning 5. Hypokalemia 6. Constipation 7. Urinary Retention    Plan/Discussion:    S/P HM II LVAD.  He is improving slowly but still tenuous. Confusion wax and wanes but definitely improved with Zyprexa.   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 Co-ox down significantly today. Will repeat. May need to increase milrinone to 0.375. Keep CVP 8-12. Hold lasix. Flows on pump ar high with low PI. May be positional (he is on his side). Will watch closely. INR and LDH ok. Need to watch dosing of coumadin.   Renal function and electrolytes ok. CXR with LLL atx.  Continue to work with PT and ambulate. Encourage oral intake. PT recommending CIR.    Length of Stay: 20 Arvilla Meres  MD  09/10/2015, 11:55 AM   Addendum: repeat co-ox 68%  Esther Broyles,MD 1:25 PM

## 2015-09-10 NOTE — Progress Notes (Signed)
VAD pager called and made aware of changes in flow, PI, and power (up 2-4 from baseline). Current map 78 and HR 92. No orders received. Will continue to monitor closely. Modena Jansky RN [facility] [place]-

## 2015-09-10 NOTE — Progress Notes (Signed)
Page received from Mr. Iorio's nurse re: increased power beyond 2 w from baseline and flows "+++" sustained x ~30 minutes. During episode noted that PI was decreased to ~3 (baseline 5-6 w). Reports that parameters are starting to normalize now.   BP improved with doppler 78, continues with NSR in 90s, CVP at goal of 12 (held diuretics today).   Reviewed labs from this AM: INR is supra-therapeutic at 3.28 so should power elevations persist would not likely anticoagulate with heparin as there are no other indicators for thrombus. He has been therapeutic since 6/28. LDH trend running 240 - 280 since implant.  He has been having these transient power/flow elevations off and on recently. Pump burn in vs. Positional occlusion of IFC? (he was sleeping during events and favors the left side while resting). Position appears good on AP CXR but no lateral film available.   Will d/w team re: value of obtaining baseline CT scan to assess position/angulation of IFC in relation to septal wall. Will also send log file tomorrow AM to [facility] for analysis.   For now advised RN to continue to monitor VAD parameters.   [name], RN VAD Coordinator   Office: [phone] 24/7 VAD Pager: [phone]

## 2015-09-10 NOTE — Progress Notes (Signed)
ANTICOAGULATION CONSULT NOTE - Follow Up Consult  Pharmacy Consult for Warfarin  Indication: LVAD  Allergies  Allergen Reactions  . Phenergan [Promethazine Hcl] Nausea And Vomiting  . Lasix [Furosemide] Other (See Comments)    "Dropped blood pressure too low"  . Morphine And Related Other (See Comments)    Goes crazy     Patient Measurements: Height: 6\' 2"  (188 cm) Weight: 166 lb 0.1 oz (75.3 kg) IBW/kg (Calculated) : 82.2   Vital Signs: Temp: 98 F (36.7 C) (07/04 0754) Temp Source: Axillary (07/04 0754) Pulse Rate: 86 (07/04 0600)  Labs:  Recent Labs  09/08/15 0435 09/09/15 0300 09/09/15 0330 09/10/15 0415  HGB 8.6* 8.7*  --  8.9*  HCT 27.7* 28.0*  --  29.0*  PLT 195 209  --  243  LABPROT 22.1*  --  25.1* 32.7*  INR 1.95*  --  2.30* 3.28*  CREATININE 0.84 0.81  --  0.80    Estimated Creatinine Clearance: 87.6 mL/min (by C-G formula based on Cr of 0.8).  Assessment: 74yom with HF.  S/p new LVAD placement.   INR trending up this am to 3.28  H/H low stable PLTC stable.  Remains on amiodarone po - started preop - for NSVT  Goal of Therapy:  INR 2-2.5 Monitor platelets by anticoagulation protocol: Yes   Plan:  Hold coumadin today Daily INR  Harland German, Pharm D 09/10/2015 9:02 AM

## 2015-09-10 NOTE — Progress Notes (Signed)
Physical Therapy Treatment Patient Details Name: [name] MRN: [id number] DOB: April 10, 1940 Today's Date: 09/10/2015    History of Present Illness Pt is a 75 y/o M who was admitted on 08/21/2015 from the [facility] with acute on chronic systolic heart failure. He is undergoing preparation this week for an LVAD placement (scheduled for 08/30/15). Pt's PMH inlcudes frequent PVCs, nonischemic cardiomyopathy, automatic ICD. Pt had LVAD placed 6/23.    PT Comments    Pt with improved gait today. Pt's mobility continues to be highly variable from day to day. Cognition continues to be major limitation.  Follow Up Recommendations  CIR     Equipment Recommendations  None recommended by PT    Recommendations for Other Services       Precautions / Restrictions Precautions Precautions: Sternal;Other (comment) Precaution Comments: LVAD  Restrictions Weight Bearing Restrictions: Yes Other Position/Activity Restrictions: sternal precautions    Mobility  Bed Mobility               General bed mobility comments: Pt sitting up in recliner   Transfers Overall transfer level: Needs assistance Equipment used: Rolling walker (2 wheeled) Transfers: Sit to/from [employer] Transfers Sit to Stand: Min assist;+2 physical assistance Stand pivot transfers: Min assist;+2 physical assistance       General transfer comment: Pt requires mod cues for LE placement, and feet blocked to prevent extension.  Requires mod cues for hand placement, and max A to maneuver walker   Ambulation/Gait Ambulation/Gait assistance: +2 physical assistance;Min assist Ambulation Distance (Feet): 320 Feet (1 sitting rest break) Assistive device: Rolling walker (2 wheeled) Gait Pattern/deviations: Step-through pattern;Decreased stride length;Drifts right/left;Trunk flexed;Narrow base of support Gait velocity: decr Gait velocity interpretation: Below normal speed for age/gender General Gait  Details: Pt requiring verbal cues and manual facilitation to steer walker with tendency to drift to the left. Cues also to stand more erect and look up. Narrow base but no scissoring. One instance of knees beginning to buckle so took a seated rest break at that time.   Stairs            Wheelchair Mobility    Modified Rankin (Stroke Patients Only)       Balance Overall balance assessment: Needs assistance Sitting-balance support: Feet supported;Bilateral upper extremity supported Sitting balance-Leahy Scale: Poor Sitting balance - Comments: Pt initially required min A to maintaine sitting EOC due to posterior lean, but progressed to min guard assist - is reliant on UE support  Postural control: Posterior lean Standing balance support: Bilateral upper extremity supported Standing balance-Leahy Scale: Poor Standing balance comment: Stood x 4 minutes with walker with +78min A initially and then progressed to +33min guard assist                    Cognition Arousal/Alertness: Awake/alert Behavior During Therapy: WFL for tasks assessed/performed Overall Cognitive Status: Impaired/Different from baseline Area of Impairment: Orientation;Attention;Memory;Following commands;Safety/judgement;Problem solving Orientation Level: Disoriented to;Place;Time;Situation Current Attention Level: Sustained Memory: Decreased recall of precautions;Decreased short-term memory Following Commands: Follows one step commands consistently;Follows one step commands with increased time Safety/Judgement: Decreased awareness of safety;Decreased awareness of deficits   Problem Solving: Slow processing;Decreased initiation;Difficulty sequencing;Requires verbal cues;Requires tactile cues General Comments: motor plannind deficits also noted     Exercises      General Comments        Pertinent Vitals/Pain Pain Assessment: No/denies pain    Home Living  Prior Function             PT Goals (current goals can now be found in the care plan section) Progress towards PT goals: Progressing toward goals    Frequency  Min 3X/week    PT Plan Current plan remains appropriate    Co-evaluation   Reason for Co-Treatment: Complexity of the patient's impairments (multi-system involvement);For patient/therapist safety   OT goals addressed during session: ADL's and self-care     End of Session   Activity Tolerance: Patient tolerated treatment well Patient left: in chair;with call bell/phone within reach;with chair alarm set;with nursing/sitter in room;with family/visitor present     Time: 4540-9811 PT Time Calculation (min) (ACUTE ONLY): 30 min  Charges:  $Gait Training: 23-37 mins                    G Codes:      [name] Sep 30, 2015, 12:06 PM [employer] PT [phone]

## 2015-09-11 ENCOUNTER — Inpatient Hospital Stay (HOSPITAL_COMMUNITY): Payer: Medicare Other

## 2015-09-11 DIAGNOSIS — J948 Other specified pleural conditions: Secondary | ICD-10-CM

## 2015-09-11 LAB — GLUCOSE, CAPILLARY
Glucose-Capillary: 115 mg/dL — ABNORMAL HIGH (ref 65–99)
Glucose-Capillary: 122 mg/dL — ABNORMAL HIGH (ref 65–99)
Glucose-Capillary: 118 mg/dL — ABNORMAL HIGH (ref 65–99)
Glucose-Capillary: 115 mg/dL — ABNORMAL HIGH (ref 65–99)
Glucose-Capillary: 120 mg/dL — ABNORMAL HIGH (ref 65–99)
Glucose-Capillary: 113 mg/dL — ABNORMAL HIGH (ref 65–99)

## 2015-09-11 LAB — COMPREHENSIVE METABOLIC PANEL
ALT: 32 U/L (ref 17–63)
AST: 30 U/L (ref 15–41)
Albumin: 2.7 g/dL — ABNORMAL LOW (ref 3.5–5.0)
Alkaline Phosphatase: 60 U/L (ref 38–126)
Anion gap: 7 (ref 5–15)
BUN: 11 mg/dL (ref 6–20)
CO2: 26 mmol/L (ref 22–32)
Calcium: 8.5 mg/dL — ABNORMAL LOW (ref 8.9–10.3)
Chloride: 104 mmol/L (ref 101–111)
Creatinine, Ser: 0.82 mg/dL (ref 0.61–1.24)
GFR calc Af Amer: >60 mL/min (ref >60)
GFR calc non Af Amer: >60 mL/min (ref >60)
Glucose, Bld: 130 mg/dL — ABNORMAL HIGH (ref 65–99)
Potassium: 3.5 mmol/L (ref 3.5–5.1)
Sodium: 137 mmol/L (ref 135–145)
Total Bilirubin: 1 mg/dL (ref 0.3–1.2)
Total Protein: 5.4 g/dL — ABNORMAL LOW (ref 6.5–8.1)

## 2015-09-11 LAB — CARBOXYHEMOGLOBIN
Carboxyhemoglobin: 1.8 % — ABNORMAL HIGH (ref 0.5–1.5)
Methemoglobin: 0.7 % (ref 0.0–1.5)
O2 Saturation: 63.4 %
Total hemoglobin: 8.2 g/dL — ABNORMAL LOW (ref 13.5–18.0)

## 2015-09-11 LAB — CBC
HCT: 26.9 % — ABNORMAL LOW (ref 39.0–52.0)
Hemoglobin: 8.3 g/dL — ABNORMAL LOW (ref 13.0–17.0)
MCH: 26.9 pg (ref 26.0–34.0)
MCHC: 30.9 g/dL (ref 30.0–36.0)
MCV: 87.1 fL (ref 78.0–100.0)
Platelets: 209 10*3/uL (ref 150–400)
RBC: 3.09 MIL/uL — ABNORMAL LOW (ref 4.22–5.81)
RDW: 17.6 % — ABNORMAL HIGH (ref 11.5–15.5)
WBC: 7.2 10*3/uL (ref 4.0–10.5)

## 2015-09-11 LAB — PROTIME-INR
INR: 3.38 — ABNORMAL HIGH (ref 0.00–1.49)
PROTHROMBIN TIME: 33.5 s — AB (ref 11.6–15.2)

## 2015-09-11 LAB — LACTATE DEHYDROGENASE: LDH: 252 U/L — AB (ref 98–192)

## 2015-09-11 MED ORDER — POTASSIUM CHLORIDE 10 MEQ/50ML IV SOLN
10.0000 meq | INTRAVENOUS | Status: AC | PRN
  Administered 2015-09-11 – 2015-09-13 (×4): 10 meq via INTRAVENOUS
  Filled 2015-09-11 (×2): qty 50

## 2015-09-11 MED ORDER — POTASSIUM CHLORIDE 10 MEQ/50ML IV SOLN
INTRAVENOUS | Status: AC
  Administered 2015-09-11: 10 meq via INTRAVENOUS
  Filled 2015-09-11: qty 100

## 2015-09-11 MED ORDER — SODIUM CHLORIDE 0.9 % IV SOLN
INTRAVENOUS | Status: DC
  Administered 2015-09-11: 08:00:00 via INTRAVENOUS

## 2015-09-11 NOTE — Progress Notes (Signed)
Physical Therapy Treatment Patient Details Name: [name] MRN: [id number] DOB: November 29, 1940 Today's Date: 09/11/2015    History of Present Illness Pt is a 75 y/o M who was admitted on 08/21/2015 from the [facility] with acute on chronic systolic heart failure. He is undergoing preparation this week for an LVAD placement (scheduled for 08/30/15). Pt's PMH inlcudes frequent PVCs, nonischemic cardiomyopathy, automatic ICD. Pt had LVAD placed 6/23.    PT Comments    Pt with improved mobility. Cognition still poor.  Follow Up Recommendations  CIR     Equipment Recommendations  None recommended by PT    Recommendations for Other Services       Precautions / Restrictions Precautions Precautions: Sternal;Other (comment) Precaution Comments: LVAD  Restrictions Weight Bearing Restrictions: Yes Other Position/Activity Restrictions: sternal precautions    Mobility  Bed Mobility Overal bed mobility: Needs Assistance Bed Mobility: Sit to Supine       Sit to supine: Max assist   General bed mobility comments: Assist to bring legs back up into bed and lower trunk.  Transfers Overall transfer level: Needs assistance Equipment used: Pushed w/c Transfers: Sit to/from Stand Sit to Stand: Min assist;+2 physical assistance         General transfer comment: Assist to bring hips up and for balance.  Ambulation/Gait Ambulation/Gait assistance: Min assist;+2 safety/equipment (3rd person to follow with chair) Ambulation Distance (Feet): 300 Feet Assistive device:  (pushing w/c) Gait Pattern/deviations: Step-through pattern;Decreased stride length;Trunk flexed;Narrow base of support;Drifts right/left Gait velocity: decr Gait velocity interpretation: Below normal speed for age/gender General Gait Details: Pt tends to drift to rt and needed verbal cues to come back toward the lt   Stairs            Wheelchair Mobility    Modified Rankin (Stroke Patients  Only)       Balance Overall balance assessment: Needs assistance Sitting-balance support: No upper extremity supported;Feet supported Sitting balance-Leahy Scale: Fair Sitting balance - Comments: Sat EOB x 5 minutes with supervision   Standing balance support: Bilateral upper extremity supported Standing balance-Leahy Scale: Poor Standing balance comment: Holding w/c and min guard for static standing                    Cognition Arousal/Alertness: Awake/alert Behavior During Therapy: WFL for tasks assessed/performed Overall Cognitive Status: Impaired/Different from baseline Area of Impairment: Orientation;Attention;Memory;Following commands;Safety/judgement;Problem solving Orientation Level: Disoriented to;Place;Time;Situation Current Attention Level: Sustained Memory: Decreased recall of precautions;Decreased short-term memory Following Commands: Follows one step commands consistently;Follows one step commands with increased time Safety/Judgement: Decreased awareness of safety;Decreased awareness of deficits   Problem Solving: Slow processing;Decreased initiation;Difficulty sequencing;Requires verbal cues;Requires tactile cues General Comments: motor plannind deficits also noted     Exercises      General Comments        Pertinent Vitals/Pain      Home Living                      Prior Function            PT Goals (current goals can now be found in the care plan section) Progress towards PT goals: Progressing toward goals    Frequency  Min 3X/week    PT Plan Current plan remains appropriate    Co-evaluation             End of Session   Activity Tolerance: Patient tolerated treatment well Patient left: with call bell/phone within reach;with  nursing/sitter in room;in bed     Time: 0906-0927 PT Time Calculation (min) (ACUTE ONLY): 21 min  Charges:  $Gait Training: 8-22 mins                    G Codes:       Silvina Hackleman 10/09/2015, 12:30 PM [employer] PT [phone]

## 2015-09-11 NOTE — Progress Notes (Signed)
Patient ID: [name], male   DOB: 06/06/1940, 75 y.o.   MRN: [id number]  SICU Evening Rounds:  Hemodynamics stable today on Milrinone 0.25.  MAP 90 tonight, CVP 17-18.  Urine output 30 cc/hr. May benefit from some diuretic in the am.  Ambulated around the ICU twice with nurses with no PI events.  Mental status the same with some confusion, some not so nice behavior. Sitting up talking with family.

## 2015-09-11 NOTE — Clinical Social Work Note (Signed)
Clinical Social Work Assessment  Patient Details  Name: [name] MRN: [id number] Date of Birth: Apr 28, 1940  Date of referral:                  Reason for consult:  Discharge Planning                Permission sought to share information with:  Facility Sport and exercise psychologist, Family Supports Permission granted to share information::  Yes, Verbal Permission Granted  Name::     Cesario Weidinger  Agency::  SNFs  Relationship::  Wife  Contact Information:     Housing/Transportation Living arrangements for the past 2 months:  Single Family Home Source of Information:  Spouse, Patient Patient Interpreter Needed:  None Criminal Activity/Legal Involvement Pertinent to Current Situation/Hospitalization:  No - Comment as needed Significant Relationships:  Spouse Lives with:  Spouse Do you feel safe going back to the place where you live?  Yes Need for family participation in patient care:  No (Coment)  Care giving concerns:  Patient's wife wants patient to have inpatient rehab.    Social Worker assessment / plan:  CSW met with patient at beside to complete assessment. Patient was resting comfortably in bed. Patient's wife was also at bedside.  CSW explained PT recommendation for CIR placement. CSW explained that an alternate plan of SNFs placement may be needed.  CSW explained SNF search and placement process to the patient and patient's wife and answered their questions. The patient's wife requested the CSW pursue alternate plan of SNFs placement, but emphasized that she wants CIR placement.  CSW will follow up with bed offers.  Employment status:  Retired Forensic scientist:    PT Recommendations:  Inpatient [place] / Referral to community resources:  [place]  Patient/Family's Response to care: The patient's wife is happy with the care the patient has received.   Patient/Family's Understanding of and Emotional Response to Diagnosis, Current  Treatment, and Prognosis: The patient's wife has a good understanding of why the patient  was admitted. She understands the care plan and what the patient will need post discharge.  Emotional Assessment Appearance:  Appears stated age Attitude/Demeanor/Rapport:  Unable to Assess Affect (typically observed):   (Unable to assess) Orientation:   (unable to assess.) Alcohol / Substance use:  Not Applicable Psych involvement (Current and /or in the community):  No (Comment)  Discharge Needs  Concerns to be addressed:  Discharge Planning Concerns Readmission within the last 30 days:  No Current discharge risk:  Physical Impairment Barriers to Discharge:  Continued Medical Work up   [employer], LCSW 09/11/2015, 4:09 PM

## 2015-09-11 NOTE — Plan of Care (Signed)
Problem: Activity: Goal: Risk for activity intolerance will decrease Outcome: Progressing Slow to progress. Mobility highly different from day to day.  Problem: Cardiac: Goal: Ability to maintain an adequate cardiac output will improve Outcome: Progressing Stable hemodynamics  Problem: Education: Goal: Knowledge of the prescribed therapeutic regimen will improve Outcome: Not Progressing Pt mental status preventing education  Problem: Coping: Goal: Level of anxiety will decrease Outcome: Not Progressing Pt still with AMS

## 2015-09-11 NOTE — Progress Notes (Signed)
Patient ID: [name], male   DOB: May 03, 1940, 75 y.o.   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 Normal cors. EF 10% with low cardiac output.   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    Foley placed 7/3 for urinary retention.    Today he is oriented but beligerent at times.. Denies SOB/Orthopnea. Co-ox 63%. CVP 6-7   Flow: 5/3 L/min  Speed: 9000 PI 6.0   Power 5.4 1 PI event over the last 24 hours.   Rare PI events.  Earlier had +++ x 30 min PI down to ~3. Not associated with PI events.   Objective:   Vital Signs: MAPs 80s   Temp:  [98.3 F (36.8 C)-99.3 F (37.4 C)] 98.6 F (37 C) (07/05 0400) Pulse Rate:  [47-104] 104 (07/05 0700) Resp:  [14-23] 22 (07/05 0800) BP: (76-106)/(66-85) 104/76 mmHg (07/05 0800) SpO2:  [94 %-100 %] 100 % (07/05 0800) Weight:  [168 lb 6.9 oz (76.4 kg)] 168 lb 6.9 oz (76.4 kg) (07/05 0500) Last BM Date: 09/11/15  Weight change: Filed Weights   09/09/15 0600 09/10/15 0800 09/11/15 0500  Weight: 170 lb 6.7 oz (77.3 kg) 166 lb 0.1 oz (75.3 kg) 168 lb 6.9 oz (76.4 kg)    Intake/Output:   Intake/Output Summary (Last 24 hours) at 09/11/15 0816 Last data filed at 09/11/15 0700  Gross per 24 hour  Intake  861.8 ml  Output   1130 ml  Net -268.2 ml    MAPS  80-90s  Physical Exam: CVP  7 General: In bed.  NAD. Sitter at bedside HEENT: normal  Neck: supple. RIJ TLC. JVP flat  Cor:  LVAD hum.  Lungs: LLL decreased.   Abdomen: soft, nondistended. No hepatosplenomegaly. Bowel sound present. Driveline site ok. Securement device in place.  Extremities: no cyanosis, clubbing, rash, no edema Neuro:  Non-focal. Alert and oriented   Telemetry:  Sinus with PVCs  Labs: Basic Metabolic Panel:  Recent Labs Lab 09/05/15 0447  09/06/15 0350  09/07/15 1140 09/08/15 0435 09/09/15 0300 09/10/15 0415 09/11/15 0419  NA 136  < > 136  < > 133*  135 138 137 137  K 3.7  < > 3.6  < > 3.7 3.4* 3.6 3.8 3.5  CL 97*  < > 100*  < > 99* 98* 95* 105 104  CO2 30  --  28  < > 27 27 25 26 26   GLUCOSE 111*  < > 116*  < > 170* 105* 147* 132* 130*  BUN 16  < > 18  < > 20 16 12 8 11   CREATININE 0.74  < > 0.73  < > 0.94 0.84 0.81 0.80 0.82  CALCIUM 8.9  --  8.7*  < > 8.8* 8.6* 10.0 8.8* 8.5*  MG 1.8  --  2.2  --   --   --  1.7  --   --   < > = values in this interval not displayed.  Liver Function Tests:  Recent Labs Lab 09/07/15 0345 09/08/15 0435 09/09/15 0300 09/10/15 0415 09/11/15 0419  AST 28 31 31 28 30   ALT 26 28 31 28  32  ALKPHOS 51 56 56 53 60  BILITOT 1.3* 1.0 1.3* 1.0 1.0  PROT 5.2* 5.3* 6.1* 5.8* 5.4*  ALBUMIN 2.6* 2.5* 3.2* 2.9* 2.7*   No results for input(s): LIPASE, AMYLASE in the  last 168 hours.  Recent Labs Lab 09/05/15 1100  AMMONIA 16    CBC:  Recent Labs Lab 09/05/15 0447  09/07/15 0345 09/08/15 0435 09/09/15 0300 09/10/15 0415 09/11/15 0419  WBC 5.8  < > 6.5 6.7 9.1 8.0 7.2  NEUTROABS 4.3  --   --   --   --   --   --   HGB 9.0*  < > 8.7* 8.6* 8.7* 8.9* 8.3*  HCT 29.1*  < > 28.0* 27.7* 28.0* 29.0* 26.9*  MCV 86.1  < > 86.7 86.0 85.9 86.8 87.1  PLT 145*  < > 167 195 209 243 209  < > = values in this interval not displayed.  Cardiac Enzymes: No results for input(s): CKTOTAL, CKMB, CKMBINDEX, TROPONINI in the last 168 hours.  BNP: BNP (last 3 results)  Recent Labs  05/13/15 2248 08/21/15 1345 08/31/15 0400  BNP 1235.6* 2132.9* 1387.0*    ProBNP (last 3 results) No results for input(s): PROBNP in the last 8760 hours.    Other results:  Imaging: Dg Chest Port 1 View  09/11/2015  CLINICAL DATA:  Left ventricular assist device, acute on chronic CHF. EXAM: PORTABLE CHEST 1 VIEW COMPARISON:  Portable chest x-ray of September 10, 2015 FINDINGS: The right lung is well-expanded and clear. On the left there is alveolar opacity in the mid and lower lung with obscuration of the hemidiaphragm. The  cardiac silhouette remains enlarged. The pulmonary vascularity is mildly prominent centrally. The left ventricular assist device is in stable position where visualized. The permanent pacemaker defibrillator is stable. The visualized sternal wires are intact. IMPRESSION: See interval improvement in the appearance of the interstitium of the right lung with no evidence of residual pulmonary edema here. On the left persistent basilar atelectasis or pneumonia. Stable cardiomegaly. No pneumothorax. The support tubes are in stable position. Electronically Signed   By: David  [place] M.D.   On: 09/11/2015 07:21   Dg Chest Port 1 View  09/10/2015  CLINICAL DATA:  LVAD EXAM: PORTABLE CHEST 1 VIEW COMPARISON:  09/09/2015 FINDINGS: LVAD, left pacer, right PICC line remain in place, unchanged. Prior median sternotomy. Cardiomegaly with left lower lobe atelectasis or infiltrate. Suspect left effusion, stable. No pneumothorax. No confluent opacity on the right. IMPRESSION: Continued left lower lobe atelectasis or infiltrate with suspected left effusion. Stable cardiomegaly. Electronically Signed   By: Charlett Nose M.D.   On: 09/10/2015 07:55     Medications:     Scheduled Medications: . amiodarone  200 mg Oral BID  . antiseptic oral rinse  7 mL Mouth Rinse q12n4p  . aspirin EC  81 mg Oral Daily  . bisacodyl  10 mg Oral Daily   Or  . bisacodyl  10 mg Rectal Daily  . chlorhexidine  15 mL Mouth Rinse BID  . feeding supplement  1 Container Oral TID BM  . ferrous fumarate-b12-vitamic C-folic acid  1 capsule Oral TID PC  . mirtazapine  15 mg Oral QHS  . OLANZapine  5 mg Oral QHS  . pantoprazole  40 mg Oral Daily  . sodium chloride flush  10-40 mL Intracatheter Q12H  . sodium chloride flush  3 mL Intravenous Q12H  . tamsulosin  0.4 mg Oral Daily  . Warfarin - Pharmacist Dosing Inpatient   Does not apply q1800  . zolpidem  5 mg Oral QHS    Infusions: . sodium chloride Stopped (08/31/15 1700)  . sodium  chloride 250 mL (08/31/15 0540)  . sodium chloride 10 mL/hr  at 09/11/15 0746  . lactated ringers Stopped (08/30/15 1500)  . lactated ringers Stopped (09/02/15 1200)  . milrinone 0.25 mcg/kg/min (09/11/15 0119)    PRN Medications: sodium chloride, antiseptic oral rinse, docusate sodium, ondansetron (ZOFRAN) IV, polyvinyl alcohol, potassium chloride, sodium chloride flush, sodium chloride flush, traMADol   Assessment:   1. 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 EF 15%. Cath 8/14 with normal cors. 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  2. Frequent PVCs 3. Severe Malnutrition- Prealbumin 14.7 on 6/20 4. Acute post-op delirium/sundowning 5. Hypokalemia 6. Constipation 7. Urinary Retention  8. Left pleural effusion   Plan/Discussion:    S/P HM II LVAD.  He is improving slowly but still tenuous. Confusion wax and wanes but definitely improved with Zyprexa.   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 Co-ox ok today. Will repeat. Continue milrinone at 0.25. Keep CVP 8-12. Hold lasix.  LDH ok.    Earlier RN noted +++ on LVAD for ~30 min. Only had 1 PI in the last 24 hours. VAD coordinator will send log file today. ? Burn in of pump.   Has L pleural effusion. Needs CT but INR 3.38 so will need to hold off per Dr Maren Beach. He has not received coumadin since July 3rd. Pharmacy dosing coumadin.   Continue to work with PT and ambulate. Encourage oral intake. PT recommending CIR.    Length of Stay: 21 Amy Clegg  NP-C   09/11/2015, 8:16 AM  Patient seen and examined with Tonye Becket, NP. We discussed all aspects of the encounter. I agree with the assessment and plan as stated above.   Continues to improve from functional standpoint but remains confused at times and also beligerent to staff and family.   Co-ox and volume status ok. Has left pleural effusion and will need chest tube when INR down.   Continue Foley catheter for now with urinary retention. Can remove as he  is more mobile. Continue Flomax.   VAD with intermittent high flows - particularly when he is on his right side. Given therapeutic INR and low LDH this is likely just positional versus pump burn-in.   CIR when stable.   Layana Konkel,MD 4:02 PM

## 2015-09-11 NOTE — Progress Notes (Signed)
ANTICOAGULATION CONSULT NOTE - Follow Up Consult  Pharmacy Consult for Coumadin Indication: LVAD  Allergies  Allergen Reactions  . Phenergan [Promethazine Hcl] Nausea And Vomiting  . Lasix [Furosemide] Other (See Comments)    "Dropped blood pressure too low"  . Morphine And Related Other (See Comments)    Goes crazy     Patient Measurements: Height: 6\' 2"  (188 cm) Weight: 168 lb 6.9 oz (76.4 kg) IBW/kg (Calculated) : 82.2  Vital Signs: Temp: 98.1 F (36.7 C) (07/05 0827) Temp Source: Oral (07/05 0827) BP: 88/75 mmHg (07/05 0900) Pulse Rate: 87 (07/05 0800)  Labs:  Recent Labs  09/09/15 0300 09/09/15 0330 09/10/15 0415 09/11/15 0419  HGB 8.7*  --  8.9* 8.3*  HCT 28.0*  --  29.0* 26.9*  PLT 209  --  243 209  LABPROT  --  25.1* 32.7* 33.5*  INR  --  2.30* 3.28* 3.38*  CREATININE 0.81  --  0.80 0.82    Estimated Creatinine Clearance: 86.7 mL/min (by C-G formula based on Cr of 0.82).  Assessment: 74yom s/p LVAD 6/23 continues on coumadin (started low dose per MD on 6/24, pharmacy dosing starting 7/3). Today's INR is above goal at 3.38 and increased despite holding yesterday's dose. Hgb low but stable. No bleeding. Continues on po amiodarone.  Goal of Therapy:  INR 2-2.5 Monitor platelets by anticoagulation protocol: Yes   Plan:  1) Hold coumadin tonight 2) Daily INR  Fredrik Rigger 09/11/2015,10:57 AM
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
## 2015-09-11 NOTE — Progress Notes (Addendum)
Admitted 08/21/15 due to acute on chronic systolic heart failure; severe NICM.  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 Maren Beach as Destination Therapy VAD.  POD 12.  Vital signs: HR:  85 Doppler MAP:   Automated BP:  82/64 O2 Sat: 100% on 2 L/[place] Wt in lbs:  158 > 199 > 188 > 187 > 184 > 180 > 182 > 168 lbs  CVP:   8  LVAD interrogation reveals:  Speed:  9000 Flow: 6.9 Power:  6.1 PI:  4.9 Alarms: none Events:  none Fixed speed:  9000 Low speed limit: 8400  Drive Line:  C/D/I. Being maintained daily with gauze dressing and aquacel silver strip. Pts wife states she is getting comfortable with sterile gloves and has changed the driveline dressing one time. Wife was encouraged to do the daily dressing change with the nurse today.   Labs:  LDH trend:  255 > 281 > 256 > 263 > 273 > 263 > 252  INR trend: 1.45 > 1.48 > 1.42 > 1.38 > 1.85 > 2.53 > 2.5 > 3.38  Hgb:  9.5 > 8.4 > 8.2 > 8.0 > 7.8 > 9.0 > 9.6 > 9.0 > 8.3  Antithrombotic Management:   INR goal: 2.0 - 2.5 Warfarin - started 08/31/15; reached therapeutic range 09/04/15 ASA 325 mg - started 09/02/15; dropped to 81 mg after INR reached therapeutic range Heparin gtt - started 09/02/15; stopped 09/03/15   Infusions: Milrinone - 0.25  Amiodarone - 400 mg BID Dopamine -  Stopped 09/05/15 Epi - stopped 09/02/15 Levo - stopped 09/01/15  OR Blood Products:  4 units FFP 2 unit plts  ICU Blood Products:  09/02/15 - 1 unit PCs  Ventilator: Extubated 08/31/15 to 5L [place] and 2ppm NO  NO - off 09/01/15  Stool: 09/02/15 (3 loose stools)  Plan/Recommendations:  1.  Remains confused; sitter and wife at bedside. 2.  PT is recommending CIR.   Carlton Adam, RN VAD Coordinator [phone] 24/7 VAD pager: [phone]    Driveline changed with pts wife. New driveline site dressing removed and site care performed using sterile technique. Drive line exit site cleaned with Chlora prep applicators x 2, allowed to dry, and  gauze dressing with aquacel strip re-applied. Exit site healing, the velour is fully implanted at exit site. No tenderness, drainage, or foul odor noted. Pt denies fever or chills. Driveline dressing is being changed daily per sterile technique. Continue daily dressings. NURSING PLEASE DO DAILY DRESSINGS WITH FAMILY. Thank you

## 2015-09-12 ENCOUNTER — Inpatient Hospital Stay (HOSPITAL_COMMUNITY): Payer: Medicare Other

## 2015-09-12 DIAGNOSIS — J90 Pleural effusion, not elsewhere classified: Secondary | ICD-10-CM

## 2015-09-12 DIAGNOSIS — Z95811 Presence of heart assist device: Secondary | ICD-10-CM

## 2015-09-12 DIAGNOSIS — I5023 Acute on chronic systolic (congestive) heart failure: Secondary | ICD-10-CM

## 2015-09-12 LAB — CBC
HCT: 26.6 % — ABNORMAL LOW (ref 39.0–52.0)
Hemoglobin: 8.2 g/dL — ABNORMAL LOW (ref 13.0–17.0)
MCH: 26.8 pg (ref 26.0–34.0)
MCHC: 30.8 g/dL (ref 30.0–36.0)
MCV: 86.9 fL (ref 78.0–100.0)
Platelets: 230 10*3/uL (ref 150–400)
RBC: 3.06 MIL/uL — ABNORMAL LOW (ref 4.22–5.81)
RDW: 17.5 % — ABNORMAL HIGH (ref 11.5–15.5)
WBC: 6.1 10*3/uL (ref 4.0–10.5)

## 2015-09-12 LAB — GLUCOSE, CAPILLARY
GLUCOSE-CAPILLARY: 117 mg/dL — AB (ref 65–99)
GLUCOSE-CAPILLARY: 101 mg/dL — AB (ref 65–99)
Glucose-Capillary: 176 mg/dL — ABNORMAL HIGH (ref 65–99)
Glucose-Capillary: 102 mg/dL — ABNORMAL HIGH (ref 65–99)
Glucose-Capillary: 168 mg/dL — ABNORMAL HIGH (ref 65–99)

## 2015-09-12 LAB — COMPREHENSIVE METABOLIC PANEL
ALT: 29 U/L (ref 17–63)
AST: 27 U/L (ref 15–41)
Albumin: 2.6 g/dL — ABNORMAL LOW (ref 3.5–5.0)
Alkaline Phosphatase: 56 U/L (ref 38–126)
Anion gap: 5 (ref 5–15)
BUN: 10 mg/dL (ref 6–20)
CO2: 26 mmol/L (ref 22–32)
Calcium: 8.4 mg/dL — ABNORMAL LOW (ref 8.9–10.3)
Chloride: 107 mmol/L (ref 101–111)
Creatinine, Ser: 0.84 mg/dL (ref 0.61–1.24)
GFR calc Af Amer: >60 mL/min (ref >60)
GFR calc non Af Amer: >60 mL/min (ref >60)
Glucose, Bld: 109 mg/dL — ABNORMAL HIGH (ref 65–99)
Potassium: 3.7 mmol/L (ref 3.5–5.1)
Sodium: 138 mmol/L (ref 135–145)
Total Bilirubin: 0.9 mg/dL (ref 0.3–1.2)
Total Protein: 5.2 g/dL — ABNORMAL LOW (ref 6.5–8.1)

## 2015-09-12 LAB — PROTIME-INR
INR: 2.65 — AB (ref 0.00–1.49)
Prothrombin Time: 27.9 seconds — ABNORMAL HIGH (ref 11.6–15.2)

## 2015-09-12 LAB — CARBOXYHEMOGLOBIN
CARBOXYHEMOGLOBIN: 1.7 % — AB (ref 0.5–1.5)
Methemoglobin: 0.8 % (ref 0.0–1.5)
O2 SAT: 58.1 %
TOTAL HEMOGLOBIN: 8.1 g/dL — AB (ref 13.5–18.0)

## 2015-09-12 LAB — LACTATE DEHYDROGENASE: LDH: 236 U/L — ABNORMAL HIGH (ref 98–192)

## 2015-09-12 MED ORDER — CITALOPRAM HYDROBROMIDE 20 MG PO TABS
10.0000 mg | ORAL_TABLET | Freq: Every day | ORAL | Status: DC
  Administered 2015-09-13 – 2015-09-19 (×7): 10 mg via ORAL
  Filled 2015-09-12 (×7): qty 1

## 2015-09-12 MED ORDER — POTASSIUM CHLORIDE 10 MEQ/50ML IV SOLN
10.0000 meq | INTRAVENOUS | Status: AC | PRN
  Administered 2015-09-12 (×3): 10 meq via INTRAVENOUS

## 2015-09-12 MED ORDER — LIDOCAINE HCL (PF) 1 % IJ SOLN
INTRAMUSCULAR | Status: AC
  Administered 2015-09-12: 5 mL
  Filled 2015-09-12: qty 30

## 2015-09-12 MED ORDER — ALBUMIN HUMAN 5 % IV SOLN: 12.5000 g | Freq: Once | INTRAVENOUS | Status: AC

## 2015-09-12 MED ORDER — FENTANYL CITRATE (PF) 100 MCG/2ML IJ SOLN
INTRAMUSCULAR | Status: AC
Start: 2015-09-12 — End: 2015-09-12
  Administered 2015-09-12: 37.5 ug
  Filled 2015-09-12: qty 2

## 2015-09-12 MED ORDER — ALBUMIN HUMAN 5 % IV SOLN
INTRAVENOUS | Status: AC
  Filled 2015-09-12: qty 250

## 2015-09-12 MED ORDER — WARFARIN SODIUM 2.5 MG PO TABS
2.5000 mg | ORAL_TABLET | Freq: Once | ORAL | Status: AC
  Administered 2015-09-12: 2.5 mg via ORAL
  Filled 2015-09-12: qty 1

## 2015-09-12 MED ORDER — ALBUMIN HUMAN 5 % IV SOLN
12.5000 g | Freq: Once | INTRAVENOUS | Status: AC
  Administered 2015-09-12: 12.5 g via INTRAVENOUS

## 2015-09-12 MED ORDER — FENTANYL CITRATE (PF) 100 MCG/2ML IJ SOLN
50.0000 ug | INTRAMUSCULAR | Status: DC | PRN
  Administered 2015-09-12 (×2): 25 ug via INTRAVENOUS
  Filled 2015-09-12: qty 2

## 2015-09-12 NOTE — Progress Notes (Signed)
Patient ID: [name], male   DOB: April 30, 1940, 75 y.o.   MRN: [id number]  SICU Evening Rounds:  Hemodynamically stable today on Milrinone 0.25. Pump parameters stable.  Ambulated once before chest tube insertion. 1680 cc out.   Urine output ok.  Sitting up in chair with family feeding him.  Remains confused but pleasantly so today.

## 2015-09-12 NOTE — Progress Notes (Signed)
Admitted 08/21/15 due to acute on chronic systolic heart failure; severe NICM.  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 Maren Beach as Destination Therapy VAD.  POD 13.  Vital signs: HR:  85 Doppler MAP: 84   Automated BP:  79/61-modified systolic O2 Sat: 161% on 2 L/[place] Wt in lbs:  158 > 199 > 188 > 187 > 184 > 180 > 182 > 168 lbs  CVP:   8  LVAD interrogation reveals:  Speed:  9000 Flow: 5.6 Power:  5.5 PI:  5.7 Alarms: none Events:  none Fixed speed:  9000 Low speed limit: 8400  Drive Line:  C/D/I. Being maintained daily with gauze dressing and aquacel silver strip. Pts wife states she is getting comfortable with sterile gloves and is learning to change the dressing. Wife was encouraged to do the daily dressing change with the nurse today.   Labs:  LDH trend:  255 > 281 > 256 > 263 > 273 > 263 > 252 > 236  INR trend: 1.45 > 1.48 > 1.42 > 1.38 > 1.85 > 2.53 > 2.5 > 3.38 > 2.65  Hgb:  9.5 > 8.4 > 8.2 > 8.0 > 7.8 > 9.0 > 9.6 > 9.0 > 8.3 > 8.2  Antithrombotic Management:   INR goal: 2.0 - 2.5 Warfarin - started 08/31/15; reached therapeutic range 09/04/15; stopped on 7/3 d/t supratherapeutic INR-still holding ASA 325 mg - started 09/02/15; dropped to 81 mg after INR reached therapeutic range Heparin gtt - started 09/02/15; stopped 09/03/15   Infusions: Milrinone - 0.25  Amiodarone - 400 mg BID Dopamine -  Stopped 09/05/15 Epi - stopped 09/02/15 Levo - stopped 09/01/15  OR Blood Products:  4 units FFP 2 unit plts  ICU Blood Products:  09/02/15 - 1 unit PCs  Ventilator: Extubated 08/31/15 to 5L [place] and 2ppm NO  NO - off 09/01/15  Stool: 09/02/15 (3 loose stools)  Chest tube placed today for Left Pleural effusion drained 1650cc initially.  Plan/Recommendations:  1.  Remains confused; sitter and wife at bedside. 2.  PT is recommending CIR. Walked with PT this morning prior to CT placement.  Carlton Adam, RN VAD Coordinator [phone] 24/7 VAD pager:  [phone]

## 2015-09-12 NOTE — Progress Notes (Signed)
ANTICOAGULATION CONSULT NOTE - Follow Up Consult  Pharmacy Consult for Coumadin Indication: LVAD  Allergies  Allergen Reactions  . Phenergan [Promethazine Hcl] Nausea And Vomiting  . Lasix [Furosemide] Other (See Comments)    "Dropped blood pressure too low"  . Morphine And Related Other (See Comments)    Goes crazy     Patient Measurements: Height: 6\' 2"  (188 cm) Weight: 168 lb 14 oz (76.6 kg) IBW/kg (Calculated) : 82.2  Vital Signs: Temp: 98.4 F (36.9 C) (07/06 0818) Temp Source: Oral (07/06 0818) BP: 79/61 mmHg (07/06 0900) Pulse Rate: 90 (07/06 1130)  Labs:  Recent Labs  09/10/15 0415 09/11/15 0419 09/12/15 0450  HGB 8.9* 8.3* 8.2*  HCT 29.0* 26.9* 26.6*  PLT 243 209 230  LABPROT 32.7* 33.5* 27.9*  INR 3.28* 3.38* 2.65*  CREATININE 0.80 0.82 0.84    Estimated Creatinine Clearance: 84.9 mL/min (by C-G formula based on Cr of 0.84).  Assessment: 74yom s/p LVAD 6/23 continues on coumadin (started low dose per MD on 6/24, pharmacy dosing starting 7/3). INR trended up to 3.38 and doses held 7/4 and 7/5. INR still slightly above goal at 2.65 but trending back down so will resume low dose coumadin today. Hgb low but stable. Had chest tube placed for left pleural effusion. Continues on po amiodarone.  Goal of Therapy:  INR 2-2.5 Monitor platelets by anticoagulation protocol: Yes   Plan:  1) Coumadin 2.5mg  x 1 2) Daily INR  Fredrik Rigger 09/12/2015,11:55 AM

## 2015-09-12 NOTE — Progress Notes (Signed)
Patient ID: [name], male   DOB: 1940/11/03, 75 y.o.   MRN: [id number]    VAD team Rounding Note   Subjective:    75 y/o with LMNA cardiomyopathy admitted 6/14 with low output HF  Underwent cath 6/15. Normal cors. EF 10% with low cardiac output.   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    Foley placed 7/3 for urinary retention.    Over night combative. Ambulated 300 feet.   Denies SOB/Orthopnea. Co-ox 58%. CVP 8    Flow: 5.1 L/min  Speed: 9000 PI 5.9    Power 5.2 1 PI event over the last 24 hours.   Rare PI events.    Objective:   Vital Signs: MAPs 80s   Temp:  [98 F (36.7 C)-100 F (37.8 C)] 99 F (37.2 C) (07/06 0343) Pulse Rate:  [78-97] 84 (07/06 0700) Resp:  [17-27] 21 (07/06 0700) BP: (82-104)/(64-86) 102/76 mmHg (07/06 0600) SpO2:  [98 %-100 %] 100 % (07/06 0700) Weight:  [168 lb 14 oz (76.6 kg)] 168 lb 14 oz (76.6 kg) (07/06 0500) Last BM Date: 09/11/15  Weight change: Filed Weights   09/10/15 0800 09/11/15 0500 09/12/15 0500  Weight: 166 lb 0.1 oz (75.3 kg) 168 lb 6.9 oz (76.4 kg) 168 lb 14 oz (76.6 kg)    Intake/Output:   Intake/Output Summary (Last 24 hours) at 09/12/15 0735 Last data filed at 09/12/15 0700  Gross per 24 hour  Intake 1389.2 ml  Output    825 ml  Net  564.2 ml    MAPS  70-80s  Physical Exam: General: In bed.  NAD. Nurse at bedside.  HEENT: normal  Neck: supple. RIJ TLC. JVP flat  Cor:  LVAD hum.  Lungs: LLL decreased.   Abdomen: soft, nondistended. No hepatosplenomegaly. Bowel sound present. Driveline site ok. Securement device in place.  Extremities: no cyanosis, clubbing, rash, no edema Neuro:  Non-focal. Alert and oriented   Telemetry:  Sinus with PVCs  Labs: Basic Metabolic Panel:  Recent Labs Lab 09/06/15 0350  09/08/15 0435 09/09/15 0300 09/10/15 0415 09/11/15 0419 09/12/15 0450  NA 136  < > 135 138 137 137 138  K 3.6  < > 3.4* 3.6 3.8 3.5 3.7  CL 100*  < > 98* 95* 105 104 107  CO2 28  < > 27 25 26 26 26     GLUCOSE 116*  < > 105* 147* 132* 130* 109*  BUN 18  < > 16 12 8 11 10   CREATININE 0.73  < > 0.84 0.81 0.80 0.82 0.84  CALCIUM 8.7*  < > 8.6* 10.0 8.8* 8.5* 8.4*  MG 2.2  --   --  1.7  --   --   --   < > = values in this interval not displayed.  Liver Function Tests:  Recent Labs Lab 09/08/15 0435 09/09/15 0300 09/10/15 0415 09/11/15 0419 09/12/15 0450  AST 31 31 28 30 27   ALT 28 31 28  32 29  ALKPHOS 56 56 53 60 56  BILITOT 1.0 1.3* 1.0 1.0 0.9  PROT 5.3* 6.1* 5.8* 5.4* 5.2*  ALBUMIN 2.5* 3.2* 2.9* 2.7* 2.6*   No results for input(s): LIPASE, AMYLASE in the last 168 hours.  Recent Labs Lab 09/05/15 1100  AMMONIA 16    CBC:  Recent Labs Lab 09/08/15 0435 09/09/15 0300 09/10/15 0415 09/11/15 0419 09/12/15 0450  WBC 6.7 9.1 8.0 7.2 6.1  HGB 8.6* 8.7* 8.9* 8.3* 8.2*  HCT 27.7* 28.0* 29.0* 26.9* 26.6*  MCV 86.0 85.9 86.8 87.1 86.9  PLT 195 209 243 209 230    Cardiac Enzymes: No results for input(s): CKTOTAL, CKMB, CKMBINDEX, TROPONINI in the last 168 hours.  BNP: BNP (last 3 results)  Recent Labs  05/13/15 2248 08/21/15 1345 08/31/15 0400  BNP 1235.6* 2132.9* 1387.0*    ProBNP (last 3 results) No results for input(s): PROBNP in the last 8760 hours.    Other results:  Imaging: Dg Chest Port 1 View  09/12/2015  CLINICAL DATA:  Left ventricular assist device EXAM: PORTABLE CHEST 1 VIEW COMPARISON:  Portable chest x-ray of September 11, 2015 FINDINGS: The right lung is well-expanded and clear. On the left there is increased pleural fluid. Only a small amount of the upper lobe is aerated. The cardiac silhouette remains enlarged. The visualized pulmonary vascularity is not clearly engorged. The left ventricular assist device is in stable position. The permanent pacemaker defibrillator is also stable. The PICC line tip projects over the mid portion of the SVC. The sternal wires are intact. IMPRESSION: Interval increase in the volume of pleural fluid on the left.  This may be in part due to the patient being less upright on today's study than on yesterday's study. Stable cardiomegaly. Stable support devices. Electronically Signed   By: David  [place] M.D.   On: 09/12/2015 07:04   Dg Chest Port 1 View  09/11/2015  CLINICAL DATA:  Left ventricular assist device, acute on chronic CHF. EXAM: PORTABLE CHEST 1 VIEW COMPARISON:  Portable chest x-ray of September 10, 2015 FINDINGS: The right lung is well-expanded and clear. On the left there is alveolar opacity in the mid and lower lung with obscuration of the hemidiaphragm. The cardiac silhouette remains enlarged. The pulmonary vascularity is mildly prominent centrally. The left ventricular assist device is in stable position where visualized. The permanent pacemaker defibrillator is stable. The visualized sternal wires are intact. IMPRESSION: See interval improvement in the appearance of the interstitium of the right lung with no evidence of residual pulmonary edema here. On the left persistent basilar atelectasis or pneumonia. Stable cardiomegaly. No pneumothorax. The support tubes are in stable position. Electronically Signed   By: David  [place] M.D.   On: 09/11/2015 07:21     Medications:     Scheduled Medications: . amiodarone  200 mg Oral BID  . antiseptic oral rinse  7 mL Mouth Rinse q12n4p  . aspirin EC  81 mg Oral Daily  . bisacodyl  10 mg Oral Daily   Or  . bisacodyl  10 mg Rectal Daily  . chlorhexidine  15 mL Mouth Rinse BID  . feeding supplement  1 Container Oral TID BM  . ferrous fumarate-b12-vitamic C-folic acid  1 capsule Oral TID PC  . mirtazapine  15 mg Oral QHS  . OLANZapine  5 mg Oral QHS  . pantoprazole  40 mg Oral Daily  . sodium chloride flush  10-40 mL Intracatheter Q12H  . sodium chloride flush  3 mL Intravenous Q12H  . tamsulosin  0.4 mg Oral Daily  . Warfarin - Pharmacist Dosing Inpatient   Does not apply q1800  . zolpidem  5 mg Oral QHS    Infusions: . sodium chloride Stopped  (08/31/15 1700)  . sodium chloride 250 mL (08/31/15 0540)  . sodium chloride 10 mL/hr (09/08/15 1609)  . sodium chloride 10 mL/hr at 09/12/15 0400  . lactated ringers Stopped (08/30/15 1500)  . lactated ringers Stopped (09/02/15 1200)  . milrinone 0.25 mcg/kg/min (09/12/15 0400)  PRN Medications: sodium chloride, antiseptic oral rinse, docusate sodium, ondansetron (ZOFRAN) IV, polyvinyl alcohol, potassium chloride, potassium chloride, sodium chloride flush, sodium chloride flush, traMADol   Assessment:   1. 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 EF 15%. Cath 8/14 with normal cors. 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  2. Frequent PVCs 3. Severe Malnutrition- Prealbumin 14.7 on 6/20 4. Acute post-op delirium/sundowning 5. Hypokalemia 6. Constipation 7. Urinary Retention  8. Left pleural effusion   Plan/Discussion:    S/P HM II LVAD.  He is improving slowly but still tenuous. Confusion wax and wanes but definitely improved with Zyprexa.   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 Co-ox 58%. Continue milrinone at 0.25. Keep CVP 8 Hold lasix.  LDH ok.    Has L pleural effusion. Needs CT but INR 2.6. CT later today.   Has not received coumadin since July 3rd. Pharmacy dosing coumadin.   Continue to work with PT and ambulate. Encourage oral intake. PT recommending CIR.    Length of Stay: 22 Amy Clegg  NP-C   09/12/2015, 7:35 AM  Patient seen and examined with Tonye Becket, NP. We discussed all aspects of the encounter. I agree with the assessment and plan as stated above.   He is s/p L chest tube. Breathing better. More alert and interactive today though he is tearful at times. Co-ox ok. Will continue milrinone. CVP 8 so will continue to hold lasix. Making progress with PT. INR 2.6. Pharmacy dosing coumadin. Continue to ambulate and progress. Goal will be CIR. Will start Celexa for post-op depression.   Eily Louvier,MD 11:18 PM

## 2015-09-12 NOTE — Care Management Important Message (Signed)
Important Message  Patient Details  Name: [name] MRN: [id number] Date of Birth: Nov 26, 1940   Medicare Important Message Given:  Yes    [name] 09/12/2015, 10:26 AM

## 2015-09-12 NOTE — Progress Notes (Signed)
OT Cancellation Note  Patient Details Name: [name] MRN: [id number] DOB: 17-Mar-1940   Cancelled Treatment:    Reason Eval/Treat Not Completed: Fatigue/lethargy limiting ability to participate - RN reports pt had chest tube placed, received Fentanyl, and has been lethargic and sleeping soundly since.  Will reattempt.   Kieryn Burtis [place], OTR/L [phone]   Jeani Hawking M 09/12/2015, 2:18 PM

## 2015-09-12 NOTE — Op Note (Signed)
Procedure-placement left chest tube, 20 French with drainage of 1.8 L of left pleural effusion  Surgeon Kathlee Nations trigt M.D. Anesthesia local 1% lidocaine with IV conscious monitored sedation Pre-and postoperative diagnosis large left pleural effusion postop implantation of VAD  Procedure- After informed consent was obtained and the patient's site properly marked the left chest was prepped and draped as a sterile field. A proper timeout was performed 1% lidocaine was infiltrated in the anterior axillary line at the fifth interspace A small 2 cm incision was made and lidocaine was infiltrated down to the intercostal muscle A hemostat was used to enter the left pleural space and immediately thin serous fluid exited under pressure The trocar chest tube was then placed in the pleural space and directed to the apex, secured to the skin with silk sutures. 1.8 L of serosanguineous fluid was drained. The tube was connected to the Pleur-evac chamber Sterile dressing was applied. Portable chest x-ray is pending. No known complications.

## 2015-09-12 NOTE — Progress Notes (Signed)
Physical Therapy Treatment Patient Details Name: [name] MRN: [id number] DOB: Jun 25, 1940 Today's Date: 09/12/2015    History of Present Illness Pt is a 75 y/o M who was admitted on 08/21/2015 [facility] with acute on chronic systolic heart failure. He is undergoing preparation this week for an LVAD placement (scheduled for 08/30/15). Pt's PMH inlcudes frequent PVCs, nonischemic cardiomyopathy, automatic ICD. Pt had LVAD placed 6/23.    PT Comments    Pt admitted with above diagnosis. Pt currently with functional limitations due to balance and endurance deficits. Pt was able to ambulate pushing wheelchair with fairly steady gait needing verbal and tactile cues at times for safety.  Pt progressing daily.   Pt will benefit from skilled PT to increase their independence and safety with mobility to allow discharge to the venue listed below.    Follow Up Recommendations  CIR     Equipment Recommendations  None recommended by PT    Recommendations for Other Services       Precautions / Restrictions Precautions Precautions: Sternal;Other (comment) Precaution Comments: LVAD  Restrictions Weight Bearing Restrictions: Yes (Sternal precautions) Other Position/Activity Restrictions: sternal precautions    Mobility  Bed Mobility Overal bed mobility: Needs Assistance Bed Mobility: Sit to Supine;Supine to Sit     Supine to sit: +2 for physical assistance;Total assist Sit to supine: Max assist   General bed mobility comments: Assist to bring LEs out of bed and for trunk.  Assist to bring legs back up into bed and lower trunk.  Transfers Overall transfer level: Needs assistance Equipment used: Pushed w/c Transfers: Sit to/from Stand Sit to Stand: Min assist;+2 physical assistance         General transfer comment: Assist to bring hips up and for balance.  Cues to use pillow and not use UEs.   Ambulation/Gait Ambulation/Gait assistance: Min assist;+2  safety/equipment (3rd person follow with chair. ) Ambulation Distance (Feet): 320 Feet (150 feet then 170 feet.) Assistive device:  (wheelchair) Gait Pattern/deviations: Step-through pattern;Drifts right/left;Decreased stride length Gait velocity: decr Gait velocity interpretation: Below normal speed for age/gender General Gait Details: Pt tends to drift to rt and needed verbal cues to come back toward the lt.  Pt needed constant cues to stand tall as well as he needs more steadying assist if he leans forward while ambulating.     Stairs            Wheelchair Mobility    Modified Rankin (Stroke Patients Only)       Balance Overall balance assessment: Needs assistance Sitting-balance support: No upper extremity supported;Feet supported Sitting balance-Leahy Scale: Fair     Standing balance support: Bilateral upper extremity supported;During functional activity Standing balance-Leahy Scale: Poor Standing balance comment: Holding wheelchair with min guard assist for balance.             High level balance activites: Backward walking;Direction changes;Turns;Sudden stops High Level Balance Comments: Instability and min assist provided with  turns and direction changes as well as backward walking.     Cognition Arousal/Alertness: Awake/alert Behavior During Therapy: WFL for tasks assessed/performed Overall Cognitive Status: Impaired/Different from baseline Area of Impairment: Orientation;Attention;Memory;Following commands;Safety/judgement;Problem solving Orientation Level: Disoriented to;Place;Time;Situation Current Attention Level: Sustained Memory: Decreased recall of precautions;Decreased short-term memory Following Commands: Follows one step commands consistently;Follows one step commands with increased time Safety/Judgement: Decreased awareness of safety;Decreased awareness of deficits Awareness: Intellectual Problem Solving: Slow processing;Decreased  initiation;Difficulty sequencing;Requires verbal cues;Requires tactile cues General Comments: motor plannind deficits also  noted     Exercises      General Comments General comments (skin integrity, edema, etc.): PI from 5.6 down to 4.3 with activity.Other VSS      Pertinent Vitals/Pain Pain Assessment: No/denies pain   See above for VS Home Living                      Prior Function            PT Goals (current goals can now be found in the care plan section) Acute Rehab PT Goals PT Goal Formulation: Patient unable to participate in goal setting Time For Goal Achievement: 09/26/15 Potential to Achieve Goals: Good Progress towards PT goals: Progressing toward goals;Goals downgraded-see care plan    Frequency  Min 3X/week    PT Plan Current plan remains appropriate    Co-evaluation             End of Session Equipment Utilized During Treatment: Gait belt Activity Tolerance: Patient tolerated treatment well Patient left: with call bell/phone within reach;with nursing/sitter in room;in bed     Time: 1914-7829 PT Time Calculation (min) (ACUTE ONLY): 36 min  Charges:  $Gait Training: 23-37 mins                    G CodesBerline Lopes 2015-09-25, 12:11 PM [employer] Acute Rehabilitation [phone] (pager)
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
## 2015-09-13 ENCOUNTER — Inpatient Hospital Stay (HOSPITAL_COMMUNITY): Payer: Medicare Other

## 2015-09-13 LAB — GLUCOSE, CAPILLARY
Glucose-Capillary: 106 mg/dL — ABNORMAL HIGH (ref 65–99)
Glucose-Capillary: 110 mg/dL — ABNORMAL HIGH (ref 65–99)
Glucose-Capillary: 137 mg/dL — ABNORMAL HIGH (ref 65–99)
Glucose-Capillary: 140 mg/dL — ABNORMAL HIGH (ref 65–99)
Glucose-Capillary: 144 mg/dL — ABNORMAL HIGH (ref 65–99)
Glucose-Capillary: 161 mg/dL — ABNORMAL HIGH (ref 65–99)

## 2015-09-13 LAB — CBC
HCT: 25.8 % — ABNORMAL LOW (ref 39.0–52.0)
Hemoglobin: 8 g/dL — ABNORMAL LOW (ref 13.0–17.0)
MCH: 26.8 pg (ref 26.0–34.0)
MCHC: 31 g/dL (ref 30.0–36.0)
MCV: 86.6 fL (ref 78.0–100.0)
Platelets: 213 10*3/uL (ref 150–400)
RBC: 2.98 MIL/uL — ABNORMAL LOW (ref 4.22–5.81)
RDW: 17.2 % — ABNORMAL HIGH (ref 11.5–15.5)
WBC: 6.2 10*3/uL (ref 4.0–10.5)

## 2015-09-13 LAB — CARBOXYHEMOGLOBIN
Carboxyhemoglobin: 1.7 % — ABNORMAL HIGH (ref 0.5–1.5)
Methemoglobin: 0.9 % (ref 0.0–1.5)
O2 Saturation: 69.3 %
Total hemoglobin: 8 g/dL — ABNORMAL LOW (ref 13.5–18.0)

## 2015-09-13 LAB — BASIC METABOLIC PANEL
ANION GAP: 3 — AB (ref 5–15)
BUN: 8 mg/dL (ref 6–20)
CALCIUM: 7.8 mg/dL — AB (ref 8.9–10.3)
CHLORIDE: 110 mmol/L (ref 101–111)
CO2: 23 mmol/L (ref 22–32)
Creatinine, Ser: 0.68 mg/dL (ref 0.61–1.24)
GFR calc Af Amer: >60 mL/min (ref >60)
Glucose, Bld: 113 mg/dL — ABNORMAL HIGH (ref 65–99)
POTASSIUM: 3.5 mmol/L (ref 3.5–5.1)
SODIUM: 136 mmol/L (ref 135–145)

## 2015-09-13 LAB — PROTIME-INR
INR: 2.35 — AB (ref 0.00–1.49)
PROTHROMBIN TIME: 25.5 s — AB (ref 11.6–15.2)

## 2015-09-13 LAB — LACTATE DEHYDROGENASE: LDH: 218 U/L — ABNORMAL HIGH (ref 98–192)

## 2015-09-13 MED ORDER — POTASSIUM CHLORIDE 10 MEQ/50ML IV SOLN
INTRAVENOUS | Status: AC
  Administered 2015-09-13: 10 meq
  Filled 2015-09-13: qty 100

## 2015-09-13 MED ORDER — WARFARIN SODIUM 2.5 MG PO TABS
2.5000 mg | ORAL_TABLET | Freq: Once | ORAL | Status: AC
  Administered 2015-09-13: 2.5 mg via ORAL
  Filled 2015-09-13: qty 1

## 2015-09-13 NOTE — Progress Notes (Signed)
Occupational Therapy Treatment Patient Details Name: [name] MRN: [id number] DOB: 17-May-1940 Today's Date: 09/13/2015    History of present illness Pt is a 75 y/o M who was [facility] on 08/21/[facility] with acute on chronic systolic heart failure. He is undergoing preparation this week for an LVAD placement (scheduled for 08/30/15). Pt's PMH inlcudes frequent PVCs, nonischemic cardiomyopathy, automatic ICD. Pt had LVAD placed 6/23.   OT comments  Pt with improved cognition compared to previous sessions, but still not at baseline and intermittent confusion noted especially as he fatigues.  He is not yet able to begin management on of lines.  He requires min A +2 for functional mobility and ambulated ~500' with third person following with recliner.  He requires max A +2 for bed mobility and is unable to recall sternal precautions.  Mod A for ADLs   Follow Up Recommendations  CIR;Supervision/Assistance - 24 hour    Equipment Recommendations  None recommended by OT    Recommendations for Other Services Rehab consult    Precautions / Restrictions Precautions Precautions: Sternal;Other (comment) Precaution Comments: LVAD  Restrictions Other Position/Activity Restrictions: sternal precautions       Mobility Bed Mobility Overal bed mobility: Needs Assistance Bed Mobility: Sit to Supine         Sit to sidelying: Max assist;+2 for physical assistance General bed mobility comments: assist to guide trunk to bed and assist to lift LEs onto bed   Transfers Overall transfer level: Needs assistance Equipment used: None (pushed stedy with recliner following ) Transfers: Sit to/from [employer] Sit to Stand: Min assist;+2 physical assistance Stand pivot transfers: Min assist;+2 physical assistance       General transfer comment: assist to move into standing and assist for balance     Balance Overall balance assessment: Needs  assistance Sitting-balance support: Feet supported Sitting balance-Leahy Scale: Fair     Standing balance support: Bilateral upper extremity supported Standing balance-Leahy Scale: Poor Standing balance comment: requires min A and bil. UE support                    ADL Overall ADL's : Needs assistance/impaired                         Toilet Transfer: Minimal assistance;+2 for physical assistance;Stand-pivot;BSC;RW   Toileting- Clothing Manipulation and Hygiene: Moderate assistance;Sit to/from stand;+2 for safety/equipment         General ADL Comments: Pt requires increased time to complete tasks.  Intermittent confusion noted.  Pt not yet able to manage power/battery connections.  Pt ambulated 500'       Vision                     Perception     Praxis      Cognition   Behavior During Therapy: WFL for tasks assessed/performed Overall Cognitive Status: Impaired/Different from baseline Area of Impairment: Orientation;Attention;Memory;Following commands;Safety/judgement;Problem solving Orientation Level: Time Current Attention Level: Sustained Memory: Decreased short-term memory;Decreased recall of precautions  Following Commands: Follows one step commands consistently;Follows one step commands with increased time Safety/Judgement: Decreased awareness of safety;Decreased awareness of deficits Awareness: Intellectual Problem Solving: Slow processing;Decreased initiation;Difficulty sequencing;Requires verbal cues;Requires tactile cues General Comments: motor plannind deficits also noted     Extremity/Trunk Assessment               Exercises     Shoulder Instructions  General Comments      Pertinent Vitals/ Pain       Pain Assessment: Faces Faces Pain Scale: Hurts even more Pain Location: Lt chest Pain Descriptors / Indicators: Grimacing;Guarding;Aching Pain Intervention(s): Repositioned;Monitored during session;Other  (comment) (RN notified )  Home Living                                          Prior Functioning/Environment              Frequency Min 3X/week     Progress Toward Goals  OT Goals(current goals can now be found in the care plan section)  Progress towards OT goals: Progressing toward goals  ADL Goals Pt Will Perform Upper Body Bathing: with set-up;with supervision;sitting Pt Will Perform Lower Body Bathing: sit to/from stand;with min assist Pt Will Perform Lower Body Dressing: with min assist;sit to/from stand Pt Will Transfer to Toilet: with min guard assist;bedside commode;stand pivot transfer Additional ADL Goal #1: Pt will demonstrte ability to manage LVAD equipmetn during ADL task with min vc  Plan Discharge plan remains appropriate    Co-evaluation                 End of Session Equipment Utilized During Treatment: Gait belt   Activity Tolerance Patient tolerated treatment well   Patient Left in bed;with call bell/phone within reach;with family/visitor present;with nursing/sitter in room   Nurse Communication Mobility status;Other (comment) (Lt sided chest pain )        Time: 9629-5284 OT Time Calculation (min): 40 min  Charges: OT General Charges $OT Visit: 1 Procedure OT Treatments $Therapeutic Activity: 38-52 mins  Tyeisha Dinan M 09/13/2015, 6:29 PM

## 2015-09-13 NOTE — Progress Notes (Signed)
Patient ID: [name], male   DOB: Apr 29, 1940, 75 y.o.   MRN: [id number]    VAD team Rounding Note   Subjective:    75 y/o with LMNA cardiomyopathy admitted 6/14 with low output HF  Underwent cath 6/15. Normal cors. EF 10% with low cardiac output.   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    Foley placed 7/3 for urinary retention.   S/P CT with 1700 cc fluid out.   Ambulated 320 feet with PT.   Denies SOB/Orthopnea. Co-ox 69%.   Flow: 6.1 L/min  Speed: 9000 PI 6  Power 6   Rare PI events. Overnight had +++ briefly noted by staff nurse.                 Objective:   Vital Signs: MAPs 80s   Temp:  [98.4 F (36.9 C)-99.6 F (37.6 C)] 98.7 F (37.1 C) (07/07 0400) Pulse Rate:  [77-90] 78 (07/07 0600) Resp:  [15-31] 22 (07/07 0700) BP: (73-104)/(61-87) 84/74 mmHg (07/07 0700) SpO2:  [95 %-100 %] 100 % (07/07 0700) Weight:  [169 lb 1.5 oz (76.7 kg)] 169 lb 1.5 oz (76.7 kg) (07/07 0500) Last BM Date: 09/11/15  Weight change: Filed Weights   09/11/15 0500 09/12/15 0500 09/13/15 0500  Weight: 168 lb 6.9 oz (76.4 kg) 168 lb 14 oz (76.6 kg) 169 lb 1.5 oz (76.7 kg)    Intake/Output:   Intake/Output Summary (Last 24 hours) at 09/13/15 0745 Last data filed at 09/13/15 0700  Gross per 24 hour  Intake  749.2 ml  Output   2775 ml  Net -2025.8 ml    MAPS  70-80s  Physical Exam: CVP 4-5  General: In the chair.  NAD. Sitter at bedside.  HEENT: normal  Neck: supple. RIJ TLC. JVP flat  Cor:  LVAD hum.  Lungs: LLL decreased.  LCT Abdomen: soft, nondistended. No hepatosplenomegaly. Bowel sound present. Driveline site ok. Securement device in place.  Extremities: no cyanosis, clubbing, rash, no edema Neuro:  Non-focal. Alert and oriented   Telemetry:  Sinus with PVCs  Labs: Basic Metabolic Panel:  Recent Labs Lab 09/08/15 0435 09/09/15 0300 09/10/15 0415 09/11/15 0419 09/12/15 0450  NA 135 138 137 137 138  K 3.4* 3.6 3.8 3.5 3.7  CL 98* 95* 105 104 107  CO2 GLUCOSE 105* 147* 132* 130* 109*  BUN CREATININE 0.84 0.81 0.80 0.82 0.84  CALCIUM 8.6* 10.0 8.8* 8.5* 8.4*  MG  --  1.7  --   --   --     Liver Function Tests:  Recent Labs Lab 09/08/15 0435 09/09/15 0300 09/10/15 0415 09/11/15 0419 09/12/15 0450  AST ALT 32 29  ALKPHOS 56 56 53 60 56  BILITOT 1.0 1.3* 1.0 1.0 0.9  PROT 5.3* 6.1* 5.8* 5.4* 5.2*  ALBUMIN 2.5* 3.2* 2.9* 2.7* 2.6*   No results for input(s): LIPASE, AMYLASE in the last 168 hours. No results for input(s): AMMONIA in the last 168 hours.  CBC:  Recent Labs Lab 09/09/15 0300 09/10/15 0415 09/11/15 0419 09/12/15 0450 09/13/15 0340  WBC 9.1 8.0 7.2 6.1 6.2  HGB 8.7* 8.9* 8.3* 8.2* 8.0*  HCT 28.0* 29.0* 26.9* 26.6* 25.8*  MCV 85.9 86.8 87.1 86.9 86.6  PLT 209 243 209 230 213    Cardiac Enzymes: No results for input(s): CKTOTAL, CKMB, CKMBINDEX, TROPONINI in the last 168 hours.  BNP: BNP (last 3 results)  Recent Labs  05/13/15 2248 08/21/15 1345 08/31/15 0400  BNP 1235.6* 2132.9* 1387.0*    ProBNP (last 3 results) No results for input(s): PROBNP in the last 8760 hours.    Other results:  Imaging: Dg Chest Port 1 View  09/13/2015  CLINICAL DATA:  Left chest tube. EXAM: PORTABLE CHEST 1 VIEW COMPARISON:  09/12/2015. FINDINGS: Left chest tube in stable position. Right PICC line in stable position. Prior median sternotomy. AICD and left ventricular assist device in stable position. Stable cardiomegaly. No pulmonary venous congestion. Low lung volumes with mild basilar atelectasis. No pneumothorax. IMPRESSION: 1. Left chest tube and right PICC line stable position. No pneumothorax. 2. Prior median sternotomy. AICD and left ventricular assist device in stable position. Stable cardiomegaly. 3.  Low lung volumes with mild bibasilar atelectasis. Electronically Signed   By: Maisie Fus  Register   On: 09/13/2015 07:11   Dg Chest Port 1 View  09/12/2015  CLINICAL  DATA:  Left chest tube placement EXAM: PORTABLE CHEST 1 VIEW COMPARISON:  09/11/2015 FINDINGS: Cardiomegaly again noted. Left ventricular assist device is unchanged in position. There is a left-sided chest tube in place. Stable right arm PICC line position. 3 leads cardiac pacemaker is unchanged in position. There is no pneumothorax. No pulmonary edema. Improvement in aeration left base. IMPRESSION: Left ventricular assist device is unchanged in position. There is a left-sided chest tube in place. Stable right arm PICC line position. 3 leads cardiac pacemaker is unchanged in position. There is no pneumothorax. No pulmonary edema. Improvement in aeration left base. Electronically Signed   By: Natasha Mead M.D.   On: 09/12/2015 11:54   Dg Chest Port 1 View  09/12/2015  CLINICAL DATA:  Left ventricular assist device EXAM: PORTABLE CHEST 1 VIEW COMPARISON:  Portable chest x-ray of September 11, 2015 FINDINGS: The right lung is well-expanded and clear. On the left there is increased pleural fluid. Only a small amount of the upper lobe is aerated. The cardiac silhouette remains enlarged. The visualized pulmonary vascularity is not clearly engorged. The left ventricular assist device is in stable position. The permanent pacemaker defibrillator is also stable. The PICC line tip projects over the mid portion of the SVC. The sternal wires are intact. IMPRESSION: Interval increase in the volume of pleural fluid on the left. This may be in part due to the patient being less upright on today's study than on yesterday's study. Stable cardiomegaly. Stable support devices. Electronically Signed   By: David  [place] M.D.   On: 09/12/2015 07:04     Medications:     Scheduled Medications: . amiodarone  200 mg Oral BID  . antiseptic oral rinse  7 mL Mouth Rinse q12n4p  . aspirin EC  81 mg Oral Daily  . bisacodyl  10 mg Oral Daily   Or  . bisacodyl  10 mg Rectal Daily  . chlorhexidine  15 mL Mouth Rinse BID  . citalopram  10 mg  Oral Daily  . feeding supplement  1 Container Oral TID BM  . ferrous fumarate-b12-vitamic C-folic acid  1 capsule Oral TID PC  . mirtazapine  15 mg Oral QHS  . OLANZapine  5 mg Oral QHS  . pantoprazole  40 mg Oral Daily  . sodium chloride flush  10-40 mL Intracatheter Q12H  . sodium chloride flush  3 mL Intravenous Q12H  . tamsulosin  0.4 mg Oral Daily  . Warfarin - Pharmacist Dosing Inpatient   Does not apply [phone]  Infusions: . sodium chloride Stopped (08/31/15 1700)  . sodium chloride 250 mL (08/31/15 0540)  . sodium chloride 10 mL/hr (09/08/15 1609)  . sodium chloride 10 mL/hr at 09/13/15 0700  . lactated ringers Stopped (08/30/15 1500)  . lactated ringers Stopped (09/02/15 1200)  . milrinone 0.25 mcg/kg/min (09/13/15 0700)    PRN Medications: sodium chloride, antiseptic oral rinse, docusate sodium, fentaNYL (SUBLIMAZE) injection, ondansetron (ZOFRAN) IV, polyvinyl alcohol, potassium chloride, sodium chloride flush, sodium chloride flush, traMADol   Assessment:   1. 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 EF 15%. Cath 8/14 with normal cors. 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  2. Frequent PVCs 3. Severe Malnutrition- Prealbumin 14.7 on 6/20 4. Acute post-op delirium/sundowning 5. Hypokalemia 6. Constipation 7. Urinary Retention  8. Left pleural effusion    Plan/Discussion:    S/P HM II LVAD.  He is improving slowly but still tenuous. Confusion wax and wanes but definitely improved with Zyprexa.   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 Co-ox 69%. Cut back milrinone at 0.125. Keep CVP 4-5.  Hold lasix.  LDH ok.    Has L pleural effusion. S/P CT with 1700 cc out.    Has not received coumadin since July 3rd. Pharmacy dosing coumadin. INR 2.35   Continue to work with PT and ambulate. Encourage oral intake. PT recommending CIR.    Length of Stay: 87 Amy Clegg  NP-C   09/13/2015, 7:45 AM  Patient seen and examined with Tonye Becket, NP. We  discussed all aspects of the encounter. I agree with the assessment and plan as stated above.   More alert this am. Co-ox 69% can cut milrinone back. Volume status ok. Continue to hold lasix.   Breathing improved after chest tube placed.   INR ok.   Continue PT/OT and nutrition.   Starting celexa for post-op depression.   VAD parameters stable.   Bensimhon, Daniel,MD 2:59 PM

## 2015-09-13 NOTE — Progress Notes (Signed)
Physical Therapy Treatment Patient Details Name: [name] MRN: [id number] DOB: [birth year] Today's Date: 09/13/2015    History of Present Illness Pt is a 75 y/o M who was [facility] on 08/21/2015 [facility] with acute on chronic systolic heart failure. He is undergoing preparation this week for an LVAD placement (scheduled for 08/30/15). Pt's PMH inlcudes frequent PVCs, nonischemic cardiomyopathy, automatic ICD. Pt had LVAD placed 6/23.    PT Comments    Pt admitted with above diagnosis. Pt currently with functional limitations due to balance and endurance deficits. Pt was able to ambulate twice around unit today.  Much improved strength.  Pt can not manipulate equipment and wife was able to verbalize all equipment needed except the back up bag.  When wife was changing pt to batteries from the power source, pt was fussing with her about it and making her nervous and she stumbled through the process.  This PT provided encouragement to wife and explained she did not need to hurry and to take her time.  Wife Needs reinforcement each time pt switches to get practice.  Pt is ready for Rehab. Pt will benefit from skilled PT to increase their independence and safety with mobility to allow discharge to the venue listed below.    Follow Up Recommendations  CIR     Equipment Recommendations  None recommended by PT    Recommendations for Other Services       Precautions / Restrictions Precautions Precautions: Sternal;Other (comment) Precaution Comments: LVAD     Mobility  Bed Mobility               General bed mobility comments: In chair  Transfers Overall transfer level: Needs assistance Equipment used:  (held pillow to stand up) Transfers: Sit to/from Stand Sit to Stand: Min assist;+2 physical assistance         General transfer comment: Assist to bring hips up and for balance.  Cues to use pillow and not use UEs.   Ambulation/Gait Ambulation/Gait  assistance: Min assist;+2 safety/equipment (3rd person followed with chair.4th person pushed IV pole) Ambulation Distance (Feet): 640 Feet Assistive device:  (pushed Stedy like a grocery cart) Gait Pattern/deviations: Step-through pattern;Drifts right/left;Decreased stride length Gait velocity: decr Gait velocity interpretation: Below normal speed for age/gender General Gait Details: Pt ambulated pushing Stedy like a grocery cart.  This worked well as pt was able to control it better than w/c and his hands were in front of him.  Occasional cues to stand tall.  Pt took a few standing rest breaks and practiced pursed lip breathing while resting.  Pt still confused but does answer questions appropriately 50% of the time.     Stairs            Wheelchair Mobility    Modified Rankin (Stroke Patients Only)       Balance Overall balance assessment: Needs assistance Sitting-balance support: No upper extremity supported;Feet supported Sitting balance-Leahy Scale: Fair     Standing balance support: Bilateral upper extremity supported Standing balance-Leahy Scale: Poor Standing balance comment: Needs UE support for balance in static stance.              High level balance activites: Backward walking;Direction changes;Turns;Sudden stops High Level Balance Comments: Instability and min assist provided with backward walking, direction changes and turns.     Cognition Arousal/Alertness: Awake/alert Behavior During Therapy: WFL for tasks assessed/performed Overall Cognitive Status: Impaired/Different from baseline Area of Impairment: Orientation;Attention;Memory;Following commands;Safety/judgement;Problem solving Orientation Level: Disoriented  to;Place;Time;Situation Current Attention Level: Sustained Memory: Decreased recall of precautions;Decreased short-term memory Following Commands: Follows one step commands consistently;Follows one step commands with increased  time Safety/Judgement: Decreased awareness of safety;Decreased awareness of deficits Awareness: Intellectual Problem Solving: Slow processing;Decreased initiation;Difficulty sequencing;Requires verbal cues;Requires tactile cues General Comments: motor plannind deficits also noted     Exercises      General Comments General comments (skin integrity, edema, etc.): PI 5.8 down to 4.3 asymptomatic.      Pertinent Vitals/Pain Pain Assessment: No/denies pain  See above for PI.      Home Living                      Prior Function            PT Goals (current goals can now be found in the care plan section) Progress towards PT goals: Progressing toward goals    Frequency  Min 3X/week    PT Plan Current plan remains appropriate    Co-evaluation             End of Session Equipment Utilized During Treatment: Gait belt Activity Tolerance: Patient tolerated treatment well Patient left: with call bell/phone within reach;with nursing/sitter in room;in chair     Time: 4356-8616 PT Time Calculation (min) (ACUTE ONLY): 39 min  Charges:  $Gait Training: 8-22 mins $Therapeutic Activity: 8-22 mins $Self Care/Home Management: 8-22                    G CodesTawni Millers F 09-26-2015, 11:31 AM [facility] [phone] (pager)

## 2015-09-13 NOTE — Progress Notes (Signed)
OT Cancellation Note  Patient Details Name: [name] MRN: [id number] DOB: Feb 11, 1941   Cancelled Treatment:    Reason Eval/Treat Not Completed: Fatigue/lethargy limiting ability to participate - Pt worked with PT earlier this am, then had bath.  He is now sleeping soundly and not able to arouse adequately.  Will try back this pm as schedule permits.   Akua Blethen Hominy, OTR/L [phone]   Jeani Hawking M 09/13/2015, 11:40 AM

## 2015-09-13 NOTE — Progress Notes (Signed)
ANTICOAGULATION CONSULT NOTE - Follow Up Consult  Pharmacy Consult for Coumadin Indication: LVAD  Allergies  Allergen Reactions  . Phenergan [Promethazine Hcl] Nausea And Vomiting  . Lasix [Furosemide] Other (See Comments)    "Dropped blood pressure too low"  . Morphine And Related Other (See Comments)    Goes crazy     Patient Measurements: Height: 6\' 2"  (188 cm) Weight: 169 lb 1.5 oz (76.7 kg) IBW/kg (Calculated) : 82.2  Vital Signs: Temp: 98.8 F (37.1 C) (07/07 0700) Temp Source: Oral (07/07 0700) BP: 84/74 mmHg (07/07 0700) Pulse Rate: 78 (07/07 0600)  Labs:  Recent Labs  09/11/15 0419 09/12/15 0450 09/13/15 0340 09/13/15 0815  HGB 8.3* 8.2* 8.0*  --   HCT 26.9* 26.6* 25.8*  --   PLT 209 230 213  --   LABPROT 33.5* 27.9* 25.5*  --   INR 3.38* 2.65* 2.35*  --   CREATININE 0.82 0.84  --  0.68    Estimated Creatinine Clearance: 89.2 mL/min (by C-G formula based on Cr of 0.68).  Assessment: 74yom s/p LVAD 6/23 continues on coumadin (started low dose per MD on 6/24, pharmacy dosing starting 7/3). INR trended up to 3.38 and doses held 7/4 and 7/5.   INR down slightly to 2.3 this morning after resuming warfarin last night. Hgb low but stable. Had chest tube placed for left pleural effusion. Continues on po amiodarone.  Goal of Therapy:  INR 2-2.5 Monitor platelets by anticoagulation protocol: Yes   Plan:  1) Coumadin 2.5mg  x 1 2) Daily INR  Sheppard Coil PharmD., BCPS Clinical Pharmacist Pager [phone] 09/13/2015 9:04 AM

## 2015-09-13 NOTE — Progress Notes (Signed)
Admitted 08/21/15 due to acute on chronic systolic heart failure; severe NICM.  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 Maren Beach as Destination Therapy VAD.  POD 14.  Vital signs: HR:  89 Doppler MAP: 89   Automated BP:  91/71-modified systolic O2 Sat: 329% on RA Wt in lbs:  158 > 199 > 188 > 187 > 184 > 180 > 182 > 168 > 169 lbs  CVP:   8  LVAD interrogation reveals:  Speed:  9000 Flow: 5.6 Power:  5.5 PI:  5.7 Alarms: none Events:  none Fixed speed:  9000 Low speed limit: 8400  Drive Line:  C/D/I. Being maintained daily with gauze dressing and aquacel silver strip. Pts wife states she is getting comfortable with sterile gloves and is learning to change the dressing. Wife was encouraged to do the daily dressing change with the nurse today.   Labs:  LDH trend:  255 > 281 > 256 > 263 > 273 > 263 > 252 > 236 > 218  INR trend: 1.45 > 1.48 > 1.42 > 1.38 > 1.85 > 2.53 > 2.5 > 3.38 > 2.65 > 2.35  Hgb:  9.5 > 8.4 > 8.2 > 8.0 > 7.8 > 9.0 > 9.6 > 9.0 > 8.3 > 8.2   Antithrombotic Management:   INR goal: 2.0 - 2.5 Warfarin - started 08/31/15; reached therapeutic range 09/04/15; stopped on 7/3; started back 7/6 pt received 2.5 mg ASA 325 mg - started 09/02/15; dropped to 81 mg after INR reached therapeutic range Heparin gtt - started 09/02/15; stopped 09/03/15   Infusions: Milrinone - 0.125 Amiodarone - 400 mg BID Dopamine -  Stopped 09/05/15 Epi - stopped 09/02/15 Levo - stopped 09/01/15  OR Blood Products:  4 units FFP 2 unit plts  ICU Blood Products:  09/02/15 - 1 unit PCs  Ventilator: Extubated 08/31/15 to 5L [place] and 2ppm NO  NO - off 09/01/15  Stool: 09/11/15-normal BM  Chest tube still in place has drained approx 1700.  Plan/Recommendations:  1.  Remains confused; sitter and wife at bedside. Pt sitting up in chair in the hallway. 2.  PT is recommending CIR. Walked with PT this morning.   Carlton Adam, RN VAD Coordinator [phone] 24/7 VAD pager:  [phone]

## 2015-09-13 NOTE — Clinical Social Work Note (Signed)
Patient ambulated 640 feet with min assist in PT this morning. He has been walking 300 or more feet for the past several days. Insurance will not cover for SNF if patient is able to walk this far.  CSW signing off. Consult if any other social work needs arise.  Charlynn Court, CSW [phone]

## 2015-09-13 NOTE — Care Management Note (Signed)
Case Management Note  Patient Details  Name: KRISTIJAN CAYEA MRN: [id number] Date of Birth: 1940-04-18  Subjective/Objective:     Pt S/p LVAD               Action/Plan: From home with wife. Continues to struggle with post-op with confusion. Remains on milrinone 0.25 mcg. Weight up 6 pounds. Increase lasix to twice a daily. Continue metolazone. Renal function stable.   Ongoing sundowning/acute delirium. On zyprexa. Has sitter.   Continue amio 400 mg po twice a day.   VAD parameters stable. Off heparin. INR 2.5. On coumadin. On 81 mg aspirin.   PT following. CIR potential.   Expected Discharge Date:  08/23/15               Expected Discharge Plan:  IP Rehab Facility  In-House Referral:  Clinical Social Work  Discharge planning Services  CM Consult  Post Acute Care Choice:    Choice offered to:     DME Arranged:    DME Agency:     HH Arranged:    HH Agency:     Status of Service:  In process, will continue to follow  If discussed at Long Length of Stay Meetings, dates discussed:    Additional Comments: 09/13/2015 S/P CT with 1700 cc fluid out.   CSW consulted for CIR back up plan. Cherylann Parr, RN 09/13/2015, 2:18 PM

## 2015-09-14 DIAGNOSIS — Z98811 Dental restoration status: Secondary | ICD-10-CM

## 2015-09-14 DIAGNOSIS — I5023 Acute on chronic systolic (congestive) heart failure: Secondary | ICD-10-CM

## 2015-09-14 LAB — CARBOXYHEMOGLOBIN
Carboxyhemoglobin: 2.3 % — ABNORMAL HIGH (ref 0.5–1.5)
Methemoglobin: 0.5 % (ref 0.0–1.5)
O2 Saturation: 71.4 %
Total hemoglobin: 8.1 g/dL — ABNORMAL LOW (ref 13.5–18.0)

## 2015-09-14 LAB — BASIC METABOLIC PANEL
Anion gap: 4 — ABNORMAL LOW (ref 5–15)
BUN: 9 mg/dL (ref 6–20)
CHLORIDE: 108 mmol/L (ref 101–111)
CO2: 25 mmol/L (ref 22–32)
CREATININE: 0.73 mg/dL (ref 0.61–1.24)
Calcium: 8.2 mg/dL — ABNORMAL LOW (ref 8.9–10.3)
GFR calc non Af Amer: >60 mL/min (ref >60)
Glucose, Bld: 100 mg/dL — ABNORMAL HIGH (ref 65–99)
POTASSIUM: 4 mmol/L (ref 3.5–5.1)
SODIUM: 137 mmol/L (ref 135–145)

## 2015-09-14 LAB — PROTIME-INR
INR: 2.24 — AB (ref 0.00–1.49)
PROTHROMBIN TIME: 24.6 s — AB (ref 11.6–15.2)

## 2015-09-14 LAB — CBC
HCT: 26.1 % — ABNORMAL LOW (ref 39.0–52.0)
Hemoglobin: 7.8 g/dL — ABNORMAL LOW (ref 13.0–17.0)
MCH: 25.8 pg — ABNORMAL LOW (ref 26.0–34.0)
MCHC: 29.9 g/dL — ABNORMAL LOW (ref 30.0–36.0)
MCV: 86.4 fL (ref 78.0–100.0)
Platelets: 225 10*3/uL (ref 150–400)
RBC: 3.02 MIL/uL — ABNORMAL LOW (ref 4.22–5.81)
RDW: 17.1 % — ABNORMAL HIGH (ref 11.5–15.5)
WBC: 5.5 10*3/uL (ref 4.0–10.5)

## 2015-09-14 LAB — GLUCOSE, CAPILLARY
GLUCOSE-CAPILLARY: 93 mg/dL (ref 65–99)
GLUCOSE-CAPILLARY: 138 mg/dL — AB (ref 65–99)
GLUCOSE-CAPILLARY: 126 mg/dL — AB (ref 65–99)
GLUCOSE-CAPILLARY: 136 mg/dL — AB (ref 65–99)
Glucose-Capillary: 101 mg/dL — ABNORMAL HIGH (ref 65–99)

## 2015-09-14 LAB — LACTATE DEHYDROGENASE: LDH: 213 U/L — ABNORMAL HIGH (ref 98–192)

## 2015-09-14 MED ORDER — WARFARIN SODIUM 3 MG PO TABS
3.0000 mg | ORAL_TABLET | Freq: Once | ORAL | Status: AC
  Administered 2015-09-14: 3 mg via ORAL
  Filled 2015-09-14: qty 1

## 2015-09-14 NOTE — Progress Notes (Signed)
Patient ID: [name], male   DOB: [birth year], 75 y.o.   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 Normal cors. EF 10% with low cardiac output.   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    Foley placed 7/3 for urinary retention. S/P CT with 1700 cc fluid out.   Mental status improving but still confused/agitated at times. On Zyprexa. Started Celexa recently for post-op depression. Continues to ambulate with PT. Appetite still not great. No dyspnea.   Weight stable. Co-ox 71% on milrinone 0.125   MAPs 70s  Flow: 6.7 L/min  Speed: 9000 PI 4.3  Power 6   Rare PI events. Occasional positional decrease in PI and +++ on flow when he lies on R side              Objective:   Vital Signs: MAPs 80s   Temp:  [98 F (36.7 C)-99.9 F (37.7 C)] 98 F (36.7 C) (07/08 0802) Pulse Rate:  [54-87] 75 (07/08 0700) Resp:  [13-28] 23 (07/08 0700) BP: (71-91)/(57-80) 71/57 mmHg (07/08 0700) SpO2:  [94 %-100 %] 100 % (07/08 0800) Weight:  [77.111 kg (170 lb)] 77.111 kg (170 lb) (07/08 0500) Last BM Date: 09/12/15  Weight change: Filed Weights   09/12/15 0500 09/13/15 0500 09/14/15 0500  Weight: 76.6 kg (168 lb 14 oz) 76.7 kg (169 lb 1.5 oz) 77.111 kg (170 lb)    Intake/Output:   Intake/Output Summary (Last 24 hours) at 09/14/15 0832 Last data filed at 09/14/15 0800  Gross per 24 hour  Intake 1149.6 ml  Output   1665 ml  Net -515.4 ml    MAPS  70s   Physical Exam: General: In the chair.  NAD. Sitter at bedside.  HEENT: normal  Neck: supple. RIJ TLC. JVP flat  Cor:  LVAD hum.  Lungs: LLL decreased.  LCT Abdomen: soft, nondistended. No hepatosplenomegaly. Bowel sound present. Driveline site ok. Securement device in place.  Extremities: no cyanosis, clubbing, rash, no edema Neuro:  Non-focal. Alert and oriented   Telemetry:  Sinus with PVCs  Labs: Basic Metabolic Panel:  Recent  Labs Lab 09/09/15 0300 09/10/15 0415 09/11/15 0419 09/12/15 0450 09/13/15 0815 09/14/15 0350  NA 138 137 137 138 136 137  K 3.6 3.8 3.5 3.7 3.5 4.0  CL 95* 105 104 107 110 108  CO2 25 26 26 26 23 25   GLUCOSE 147* 132* 130* 109* 113* 100*  BUN 12 8 11 10 8 9   CREATININE 0.81 0.80 0.82 0.84 0.68 0.73  CALCIUM 10.0 8.8* 8.5* 8.4* 7.8* 8.2*  MG 1.7  --   --   --   --   --     Liver Function Tests:  Recent Labs Lab 09/08/15 0435 09/09/15 0300 09/10/15 0415 09/11/15 0419 09/12/15 0450  AST 31 31 28 30 27   ALT 28 31 28  32 29  ALKPHOS 56 56 53 60 56  BILITOT 1.0 1.3* 1.0 1.0 0.9  PROT 5.3* 6.1* 5.8* 5.4* 5.2*  ALBUMIN 2.5* 3.2* 2.9* 2.7* 2.6*   No results for input(s): LIPASE, AMYLASE in the last 168 hours. No results for input(s): AMMONIA in the last 168 hours.  CBC:  Recent Labs Lab 09/10/15 0415 09/11/15 0419 09/12/15 0450 09/13/15 0340 09/14/15 0350  WBC 8.0 7.2 6.1 6.2 5.5  HGB 8.9* 8.3* 8.2* 8.0* 7.8*  HCT 29.0* 26.9* 26.6*  25.8* 26.1*  MCV 86.8 87.1 86.9 86.6 86.4  PLT 243 209 230 213 225    Cardiac Enzymes: No results for input(s): CKTOTAL, CKMB, CKMBINDEX, TROPONINI in the last 168 hours.  BNP: BNP (last 3 results)  Recent Labs  05/13/15 2248 08/21/15 1345 08/31/15 0400  BNP 1235.6* 2132.9* 1387.0*    ProBNP (last 3 results) No results for input(s): PROBNP in the last 8760 hours.    Other results:  Imaging: Dg Chest Port 1 View  09/13/2015  CLINICAL DATA:  Left chest tube. EXAM: PORTABLE CHEST 1 VIEW COMPARISON:  09/12/2015. FINDINGS: Left chest tube in stable position. Right PICC line in stable position. Prior median sternotomy. AICD and left ventricular assist device in stable position. Stable cardiomegaly. No pulmonary venous congestion. Low lung volumes with mild basilar atelectasis. No pneumothorax. IMPRESSION: 1. Left chest tube and right PICC line stable position. No pneumothorax. 2. Prior median sternotomy. AICD and left ventricular  assist device in stable position. Stable cardiomegaly. 3.  Low lung volumes with mild bibasilar atelectasis. Electronically Signed   By: Maisie Fus  Register   On: 09/13/2015 07:11   Dg Chest Port 1 View  09/12/2015  CLINICAL DATA:  Left chest tube placement EXAM: PORTABLE CHEST 1 VIEW COMPARISON:  09/11/2015 FINDINGS: Cardiomegaly again noted. Left ventricular assist device is unchanged in position. There is a left-sided chest tube in place. Stable right arm PICC line position. 3 leads cardiac pacemaker is unchanged in position. There is no pneumothorax. No pulmonary edema. Improvement in aeration left base. IMPRESSION: Left ventricular assist device is unchanged in position. There is a left-sided chest tube in place. Stable right arm PICC line position. 3 leads cardiac pacemaker is unchanged in position. There is no pneumothorax. No pulmonary edema. Improvement in aeration left base. Electronically Signed   By: Natasha Mead M.D.   On: 09/12/2015 11:54     Medications:     Scheduled Medications: . amiodarone  200 mg Oral BID  . antiseptic oral rinse  7 mL Mouth Rinse q12n4p  . aspirin EC  81 mg Oral Daily  . bisacodyl  10 mg Oral Daily   Or  . bisacodyl  10 mg Rectal Daily  . chlorhexidine  15 mL Mouth Rinse BID  . citalopram  10 mg Oral Daily  . feeding supplement  1 Container Oral TID BM  . ferrous fumarate-b12-vitamic C-folic acid  1 capsule Oral TID PC  . mirtazapine  15 mg Oral QHS  . OLANZapine  5 mg Oral QHS  . pantoprazole  40 mg Oral Daily  . sodium chloride flush  10-40 mL Intracatheter Q12H  . sodium chloride flush  3 mL Intravenous Q12H  . tamsulosin  0.4 mg Oral Daily  . Warfarin - Pharmacist Dosing Inpatient   Does not apply q1800    Infusions: . sodium chloride Stopped (08/31/15 1700)  . sodium chloride 250 mL (08/31/15 0540)  . sodium chloride 10 mL/hr (09/08/15 1609)  . sodium chloride 10 mL/hr at 09/14/15 0800  . lactated ringers Stopped (08/30/15 1500)  . lactated  ringers Stopped (09/02/15 1200)  . milrinone 0.125 mcg/kg/min (09/14/15 0800)    PRN Medications: sodium chloride, antiseptic oral rinse, docusate sodium, fentaNYL (SUBLIMAZE) injection, ondansetron (ZOFRAN) IV, polyvinyl alcohol, sodium chloride flush, sodium chloride flush, traMADol   Assessment:   1. 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 EF 15%. Cath 8/14 with normal cors. 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  2. Frequent PVCs 3.  Severe Malnutrition- Prealbumin 14.7 on 6/20 4. Acute post-op delirium/sundowning 5. Urinary Retention requiring Foley 6. Left pleural effusion s/p chest tube   Plan/Discussion:    Mental status continues to improve. Continue Zyprexa and Celexa.  Co-ox 71% can stop milrinone. Volume status ok. Weight stable. MAP 70s. Continue to hold lasix.   INR ok.   Leave Foley in for urinary retention. Continue Flomax.   Hopefully can got to 2W soon. Will need ramp echo this week.   VAD parameters stable.   Continue to work with PT and ambulate. Encourage oral intake. PT recommending CIR.    Length of Stay: 24  Bensimhon, Daniel,MD 8:32 AM
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
## 2015-09-14 NOTE — Progress Notes (Signed)
ANTICOAGULATION CONSULT NOTE - Follow Up Consult  Pharmacy Consult for Coumadin Indication: LVAD  Allergies  Allergen Reactions  . Phenergan [Promethazine Hcl] Nausea And Vomiting  . Lasix [Furosemide] Other (See Comments)    "Dropped blood pressure too low"  . Morphine And Related Other (See Comments)    Goes crazy     Patient Measurements: Height: 6\' 2"  (188 cm) Weight: 170 lb (77.111 kg) IBW/kg (Calculated) : 82.2  Vital Signs: Temp: 97.9 F (36.6 C) (07/08 1149) Temp Source: Oral (07/08 1149) BP: 84/68 mmHg (07/08 1500) Pulse Rate: 72 (07/08 1500)  Labs:  Recent Labs  09/12/15 0450 09/13/15 0340 09/13/15 0815 09/14/15 0350  HGB 8.2* 8.0*  --  7.8*  HCT 26.6* 25.8*  --  26.1*  PLT 230 213  --  225  LABPROT 27.9* 25.5*  --  24.6*  INR 2.65* 2.35*  --  2.24*  CREATININE 0.84  --  0.68 0.73    Estimated Creatinine Clearance: 89.7 mL/min (by C-G formula based on Cr of 0.73).  Assessment: 74yom s/p LVAD 6/23 continues on coumadin (started low dose per MD on 6/24, pharmacy dosing starting 7/3). INR trended up to 3.38 and doses held 7/4 and 7/5.   INR falling after doses held 2.24but watching trends seems to bump too much with 4 and 5mg . Hgb low but stable. Had chest tube placed for left pleural effusion. Continues on po amiodarone.  Goal of Therapy:  INR 2-2.5 Monitor platelets by anticoagulation protocol: Yes   Plan:  1) Coumadin 3mg  x 1 2) Daily INR  Leota Sauers Pharm.D. CPP, BCPS Clinical Pharmacist [phone] 09/14/2015 4:32 PM

## 2015-09-15 ENCOUNTER — Inpatient Hospital Stay (HOSPITAL_COMMUNITY): Payer: Medicare Other

## 2015-09-15 LAB — PROTIME-INR
INR: 2.13 — ABNORMAL HIGH (ref 0.00–1.49)
Prothrombin Time: 23.7 seconds — ABNORMAL HIGH (ref 11.6–15.2)

## 2015-09-15 LAB — GLUCOSE, CAPILLARY
GLUCOSE-CAPILLARY: 111 mg/dL — AB (ref 65–99)
GLUCOSE-CAPILLARY: 123 mg/dL — AB (ref 65–99)
GLUCOSE-CAPILLARY: 127 mg/dL — AB (ref 65–99)
GLUCOSE-CAPILLARY: 128 mg/dL — AB (ref 65–99)
GLUCOSE-CAPILLARY: 142 mg/dL — AB (ref 65–99)
Glucose-Capillary: 119 mg/dL — ABNORMAL HIGH (ref 65–99)
Glucose-Capillary: 134 mg/dL — ABNORMAL HIGH (ref 65–99)

## 2015-09-15 LAB — CARBOXYHEMOGLOBIN
Carboxyhemoglobin: 1.7 % — ABNORMAL HIGH (ref 0.5–1.5)
Methemoglobin: 0.6 % (ref 0.0–1.5)
O2 SAT: 65.3 %
Total hemoglobin: 8.5 g/dL — ABNORMAL LOW (ref 13.5–18.0)

## 2015-09-15 LAB — BASIC METABOLIC PANEL
ANION GAP: 3 — AB (ref 5–15)
BUN: 9 mg/dL (ref 6–20)
CO2: 26 mmol/L (ref 22–32)
Calcium: 8.3 mg/dL — ABNORMAL LOW (ref 8.9–10.3)
Chloride: 108 mmol/L (ref 101–111)
Creatinine, Ser: 0.75 mg/dL (ref 0.61–1.24)
GFR calc Af Amer: >60 mL/min (ref >60)
GLUCOSE: 93 mg/dL (ref 65–99)
POTASSIUM: 3.9 mmol/L (ref 3.5–5.1)
SODIUM: 137 mmol/L (ref 135–145)

## 2015-09-15 LAB — CBC
HCT: 26.3 % — ABNORMAL LOW (ref 39.0–52.0)
Hemoglobin: 7.9 g/dL — ABNORMAL LOW (ref 13.0–17.0)
MCH: 26.1 pg (ref 26.0–34.0)
MCHC: 30 g/dL (ref 30.0–36.0)
MCV: 86.8 fL (ref 78.0–100.0)
Platelets: 201 10*3/uL (ref 150–400)
RBC: 3.03 MIL/uL — ABNORMAL LOW (ref 4.22–5.81)
RDW: 17.2 % — ABNORMAL HIGH (ref 11.5–15.5)
WBC: 4.5 10*3/uL (ref 4.0–10.5)

## 2015-09-15 LAB — LACTATE DEHYDROGENASE: LDH: 201 U/L — AB (ref 98–192)

## 2015-09-15 MED ORDER — WARFARIN SODIUM 3 MG PO TABS
4.0000 mg | ORAL_TABLET | Freq: Once | ORAL | Status: AC
  Administered 2015-09-15: 4 mg via ORAL
  Filled 2015-09-15: qty 0.5

## 2015-09-15 MED ORDER — SORBITOL 70 % SOLN
30.0000 mL | Freq: Once | Status: AC
  Administered 2015-09-15: 30 mL via ORAL
  Filled 2015-09-15 (×2): qty 30

## 2015-09-15 NOTE — Plan of Care (Signed)
Problem: Activity: Goal: Capacity to carry out activities will improve Outcome: Progressing Ambulated twice today, 2 laps around unit each time with minimal assist the second walk.  Problem: Education: Goal: Knowledge of the prescribed therapeutic regimen will improve Outcome: Progressing Pt eager to participate in self test of system controller this evening. Needs reinforcement teaching.
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
## 2015-09-15 NOTE — Progress Notes (Signed)
Physical Therapy Treatment Patient Details Name: [name] MRN: [id number] DOB: 1940-10-05 Today's Date: 09/15/2015    History of Present Illness Pt is a 75 y/o M who was [facility] on 08/21/[facility] with acute on chronic systolic heart failure. He is undergoing preparation this week for an LVAD placement (scheduled for 08/30/15). Pt's PMH inlcudes frequent PVCs, nonischemic cardiomyopathy, automatic ICD. Pt had LVAD placed 6/23.    PT Comments    Pt performed same distance from last tx required 3 standing rest breaks.  Pt fatigue with mobility but able to maintain activity tolerance.  RN present during tx to supervise LVAD equipment.  Educated wife on placement and use and pt remains nervous during transition but appeared less nervous than previous tx.  Pt unable to comprehend LVAD components.  Pts wife will remain to require practice to improve technique.    Follow Up Recommendations  CIR     Equipment Recommendations  None recommended by PT    Recommendations for Other Services OT consult     Precautions / Restrictions Precautions Precautions: Sternal;Other (comment) Precaution Comments: LVAD  Restrictions Weight Bearing Restrictions: Yes (sternal precautions.  ) Other Position/Activity Restrictions: sternal precautions    Mobility  Bed Mobility Overal bed mobility: Needs Assistance Bed Mobility: Supine to Sit     Supine to sit: Max assist;+2 for physical assistance (Pt slow to follow commands, attempts to move LEs slowly, follows commands for use of heart pillow but lacks core strength to elevate trunk without assistance.  )     General bed mobility comments: Assist for LE advancement and trunk elevation.  Pt slow to elevate trunk into sitting and follow commands.    Transfers Overall transfer level: Needs assistance Equipment used: None (push stedy with recliner following patient.  ) Transfers: Sit to/from Stand Sit to Stand: Mod  assist;+2 physical assistance         General transfer comment: Pt required use of bed pad and boosting of hips to elevate into standing, posterior lean noted with L lateral lean initially.  Pt improved posture as gait progressed.  Ambulation/Gait Ambulation/Gait assistance: Min assist;+2 safety/equipment Ambulation Distance (Feet): 640 Feet (with 3 standing rest breaks.  ) Assistive device:  (pushed stedy like a grocery cart.  )   Gait velocity: decr   General Gait Details: Pt ambulated pushing Stedy like a grocery cart.  This worked well as pt was able to control it better than w/c and his hands were in front of him.  Occasional cues to stand tall.  Pt took a few standing rest breaks and practiced pursed lip breathing while resting.  Pt still confused but does answer questions appropriately 50% of the time.  PI ranged from 3.7 to 5.6 during gait, nurse present when PI decreased but quickly returned to 4.2 during standing break.     Stairs            Wheelchair Mobility    Modified Rankin (Stroke Patients Only)       Balance Overall balance assessment: Needs assistance Sitting-balance support: Feet supported Sitting balance-Leahy Scale: Fair       Standing balance-Leahy Scale: Poor Standing balance comment: Posterior lean, improved with hand placement on stedy.                      Cognition Arousal/Alertness: Awake/alert Behavior During Therapy: WFL for tasks assessed/performed Overall Cognitive Status: Impaired/Different from baseline Area of Impairment: Orientation;Attention;Memory;Following commands;Safety/judgement;Problem solving Orientation  Level: Time   Memory: Decreased short-term memory;Decreased recall of precautions Following Commands: Follows one step commands consistently;Follows one step commands with increased time Safety/Judgement: Decreased awareness of safety;Decreased awareness of deficits Awareness: Intellectual Problem Solving: Slow  processing;Decreased initiation;Difficulty sequencing;Requires verbal cues;Requires tactile cues      Exercises      General Comments        Pertinent Vitals/Pain Pain Assessment: Faces Faces Pain Scale: Hurts a little bit Pain Location: Lt chest Pain Descriptors / Indicators: Discomfort Pain Intervention(s): Monitored during session;Repositioned    Home Living                      Prior Function            PT Goals (current goals can now be found in the care plan section) Acute Rehab PT Goals Potential to Achieve Goals: Good Progress towards PT goals: Progressing toward goals    Frequency  Min 3X/week    PT Plan Current plan remains appropriate    Co-evaluation             End of Session Equipment Utilized During Treatment:  (no gait belt used due to lines and leads.  ) Activity Tolerance: Patient tolerated treatment well Patient left: with call bell/phone within reach;in chair     Time: 6629-4765 PT Time Calculation (min) (ACUTE ONLY): 34 min  Charges:  $Gait Training: 8-22 mins $Therapeutic Activity: 8-22 mins                    G Codes:      Florestine Avers 09/20/2015, 1:00 PM  Joycelyn Rua, PTA pager [phone]

## 2015-09-15 NOTE — Progress Notes (Signed)
ANTICOAGULATION CONSULT NOTE - Follow Up Consult  Pharmacy Consult for Coumadin Indication: LVAD  Allergies  Allergen Reactions  . Phenergan [Promethazine Hcl] Nausea And Vomiting  . Lasix [Furosemide] Other (See Comments)    "Dropped blood pressure too low"  . Morphine And Related Other (See Comments)    Goes crazy     Patient Measurements: Height: 6\' 2"  (188 cm) Weight: 171 lb 8.3 oz (77.8 kg) IBW/kg (Calculated) : 82.2  Vital Signs: Temp: 98.6 F (37 C) (07/09 0749) Temp Source: Oral (07/09 0749) BP: 96/85 mmHg (07/09 0900) Pulse Rate: 64 (07/09 0900)  Labs:  Recent Labs  09/13/15 0340 09/13/15 0815 09/14/15 0350 09/15/15 0336  HGB 8.0*  --  7.8* 7.9*  HCT 25.8*  --  26.1* 26.3*  PLT 213  --  225 201  LABPROT 25.5*  --  24.6* 23.7*  INR 2.35*  --  2.24* 2.13*  CREATININE  --  0.68 0.73 0.75    Estimated Creatinine Clearance: 90.5 mL/min (by C-G formula based on Cr of 0.75).  Assessment: 74yom s/p LVAD 6/23 continues on coumadin (started low dose per MD on 6/24, pharmacy dosing starting 7/3). INR trended up to 3.38 and doses held 7/4 and 7/5.   INR falling after doses held 2.13 but watching trends seems to bump too much with 4 and 5mg . Hgb low but stable. Had chest tube placed for left pleural effusion. Continues on po amiodarone. Starting to eat more and Boost drink started 7/3 may need to increase doses of warfarin as he improves.   Goal of Therapy:  INR 2-2.5 Monitor platelets by anticoagulation protocol: Yes   Plan:  1) Coumadin 4mg  x 1 2) Daily INR  Leota Sauers Pharm.D. CPP, BCPS Clinical Pharmacist [phone] 09/15/2015 10:42 AM

## 2015-09-16 ENCOUNTER — Inpatient Hospital Stay (HOSPITAL_COMMUNITY): Payer: Medicare Other

## 2015-09-16 DIAGNOSIS — I509 Heart failure, unspecified: Secondary | ICD-10-CM

## 2015-09-16 LAB — BASIC METABOLIC PANEL
ANION GAP: 4 — AB (ref 5–15)
BUN: 9 mg/dL (ref 6–20)
CALCIUM: 8.4 mg/dL — AB (ref 8.9–10.3)
CO2: 26 mmol/L (ref 22–32)
CREATININE: 0.77 mg/dL (ref 0.61–1.24)
Chloride: 107 mmol/L (ref 101–111)
GFR calc Af Amer: >60 mL/min (ref >60)
GLUCOSE: 98 mg/dL (ref 65–99)
Potassium: 4 mmol/L (ref 3.5–5.1)
Sodium: 137 mmol/L (ref 135–145)

## 2015-09-16 LAB — GLUCOSE, CAPILLARY
GLUCOSE-CAPILLARY: 99 mg/dL (ref 65–99)
GLUCOSE-CAPILLARY: 156 mg/dL — AB (ref 65–99)
GLUCOSE-CAPILLARY: 105 mg/dL — AB (ref 65–99)
Glucose-Capillary: 136 mg/dL — ABNORMAL HIGH (ref 65–99)

## 2015-09-16 LAB — CBC
HCT: 28.6 % — ABNORMAL LOW (ref 39.0–52.0)
Hemoglobin: 8.6 g/dL — ABNORMAL LOW (ref 13.0–17.0)
MCH: 26.1 pg (ref 26.0–34.0)
MCHC: 30.1 g/dL (ref 30.0–36.0)
MCV: 86.7 fL (ref 78.0–100.0)
Platelets: 200 10*3/uL (ref 150–400)
RBC: 3.3 MIL/uL — ABNORMAL LOW (ref 4.22–5.81)
RDW: 17.1 % — ABNORMAL HIGH (ref 11.5–15.5)
WBC: 4.7 10*3/uL (ref 4.0–10.5)

## 2015-09-16 LAB — CARBOXYHEMOGLOBIN
CARBOXYHEMOGLOBIN: 2.1 % — AB (ref 0.5–1.5)
METHEMOGLOBIN: 0.5 % (ref 0.0–1.5)
O2 SAT: 70 %
TOTAL HEMOGLOBIN: 8.5 g/dL — AB (ref 13.5–18.0)

## 2015-09-16 LAB — ECHOCARDIOGRAM LIMITED
Height: 74 in
Weight: 2733.7 oz

## 2015-09-16 LAB — LACTATE DEHYDROGENASE: LDH: 237 U/L — ABNORMAL HIGH (ref 98–192)

## 2015-09-16 LAB — PROTIME-INR
INR: 1.94 — AB (ref 0.00–1.49)
PROTHROMBIN TIME: 22 s — AB (ref 11.6–15.2)

## 2015-09-16 MED ORDER — ALTEPLASE 2 MG IJ SOLR
2.0000 mg | Freq: Once | INTRAMUSCULAR | Status: AC
  Administered 2015-09-16: 2 mg

## 2015-09-16 MED ORDER — WARFARIN SODIUM 4 MG PO TABS
4.0000 mg | ORAL_TABLET | Freq: Once | ORAL | Status: AC
  Administered 2015-09-16: 4 mg via ORAL
  Filled 2015-09-16 (×2): qty 1

## 2015-09-16 NOTE — Progress Notes (Signed)
Re page: from bedside nurse stating that pt power and flow have been elevated for the past 30 minutes. Nurse stated that pt was not lying on his side and was lying in bed when the power/flow were elevated. Nurse stated the pt was asymptomatic. This continues to be a problem with [name]. We will download the data from his pump today and send to [facility] for review. It is possible that the spikes are r/t pump position.   Carlton Adam, RN VAD Coordinator  VAD Team Pager [phone] (7am - 7am)

## 2015-09-16 NOTE — Progress Notes (Signed)
Report given to [name], RN on 2W; wife to change LVAD dressing prior to transfer; will cont. To monitor.  [name]

## 2015-09-16 NOTE — Progress Notes (Signed)
Nutrition Follow-up  DOCUMENTATION CODES:   Severe malnutrition in context of chronic illness  INTERVENTION:   Chocolate Mighty Shake II TID with meals, each supplement provides 480-500 kcals and 20-23 grams of protein  NUTRITION DIAGNOSIS:   Malnutrition related to chronic illness as evidenced by severe depletion of body fat, severe depletion of muscle mass, energy intake < or equal to 75% for > or equal to 1 month, ongoing  GOAL:   Patient will meet greater than or equal to 90% of their needs, progressing  MONITOR:   PO intake, Supplement acceptance, Labs, Weight trends, I & O's  ASSESSMENT:   75 y/o with LMNA cardiomyopathy admitted 6/14 with low output HF Underwent cath 6/15. Normal cors. EF 10% with low cardiac output. Has diuresed 20 pounds. Feels better. Co-ox remains low. Still with frequent ectopy   Patient s/p procedure 6/23: INSERTION OF IMPLANTABLE LEFT VENTRICULAR ASSIST DEVICE  Patient drowsy and a bit confused >> wife at bedside. PO intake improved, however, remains variable at 30-90% per flowsheet records. He didn't care for the [facility] supplement. Likes chocolate flavor >> will try Mighty Shake II (will come on meal trays). Emphasized importance of nutrition and encourage pt to do his best with meals.  Diet Order:  Diet regular Room service appropriate?: Yes; Fluid consistency:: Thin  Skin:  Wound (see comment) (surgical chest incision )  Last BM:  7/5  Height:   Ht Readings from Last 1 Encounters:  08/21/15 6\' 2"  (1.88 m)    Weight:   Wt Readings from Last 1 Encounters:  09/16/15 170 lb 13.7 oz (77.5 kg)    Ideal Body Weight:  86.36 kg  BMI:  Body mass index is 21.93 kg/(m^2).  Estimated Nutritional Needs:   Kcal:  2000-2200  Protein:  100-115 grams  Fluid:  Per MD  EDUCATION NEEDS:   No education needs identified at this time  Maureen Chatters, RD, LDN Pager #: [phone] After-Hours Pager #: [phone]

## 2015-09-16 NOTE — Care Management Important Message (Signed)
Important Message  Patient Details  Name: [name] MRN: [id number] Date of Birth: [birth year]   Medicare Important Message Given:  Yes    [name] 09/16/2015, 8:15 AM

## 2015-09-16 NOTE — Progress Notes (Signed)
Interpretation of Log file information provided to [facility]. No indication for thrombus and likely (although not definitively) is associated with the pump's burning in period given the transient pattern.   Continue to monitor for transient power elevations daily on LVAD interrogation.   [name], RN VAD Coordinator   Office: [phone] 24/7 Emergency VAD Pager: [phone]

## 2015-09-16 NOTE — Progress Notes (Signed)
  Echocardiogram 2D Echocardiogram has been performed.  Arvil Chaco 09/16/2015, 1:31 PM

## 2015-09-16 NOTE — Progress Notes (Signed)
Patient ID: [name], male   DOB: 08/23/1940, 75 y.o.   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 Normal cors. EF 10% with low cardiac output.   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    Foley placed 7/3 for urinary retention. S/P CT with 1700 cc fluid out.   Mental status continues to improved. Slept last night. On Zyprexa and Celexa. Continues to ambulate with PT.   Stable off milrinone. Todays Co-ox is 70%.   Feels good today. Denies SOB/pain. Walked 4 times around the unit.   LDH 201>237  MAPs 70s  Flow: 4.9   L/min  Speed: 8990 PI 5.0  Power 5.2.  Occasional PI events On 7/9 had Power Spike 10.4. Decrease in PI and +++ on flow when he lies on R side              Objective:   Vital Signs: MAPs 70s   Temp:  [98.2 F (36.8 C)-98.5 F (36.9 C)] 98.4 F (36.9 C) (07/10 0000) Pulse Rate:  [63-81] 70 (07/10 0700) Resp:  [10-26] 23 (07/10 0700) BP: (79-101)/(67-89) 96/82 mmHg (07/10 0400) SpO2:  [97 %-100 %] 99 % (07/10 0700) Weight:  [170 lb 13.7 oz (77.5 kg)] 170 lb 13.7 oz (77.5 kg) (07/10 0600) Last BM Date: 09/11/15 (last documented)  Weight change: Filed Weights   09/14/15 0500 09/15/15 0500 09/16/15 0600  Weight: 170 lb (77.111 kg) 171 lb 8.3 oz (77.8 kg) 170 lb 13.7 oz (77.5 kg)    Intake/Output:   Intake/Output Summary (Last 24 hours) at 09/16/15 0751 Last data filed at 09/16/15 0600  Gross per 24 hour  Intake    370 ml  Output   1320 ml  Net   -950 ml    MAPS  70s   Physical Exam: General: In the chair.  NAD.  HEENT: normal  Neck: supple. RIJ TLC. JVP flat  Cor:  LVAD hum.  Lungs: LLL decreased Abdomen: soft, nondistended. No hepatosplenomegaly. Bowel sound present. Driveline site ok. Securement device in place.  Extremities: no cyanosis, clubbing, rash, no edema Neuro:  Alert and oriented  GU: Foley.   Telemetry:  Sinus with  PVCs  Labs: Basic Metabolic Panel:  Recent Labs Lab 09/12/15 0450 09/13/15 0815 09/14/15 0350 09/15/15 0336 09/16/15 0415  NA 138 136 137 137 137  K 3.7 3.5 4.0 3.9 4.0  CL 107 110 108 108 107  CO2 26 23 25 26 26   GLUCOSE 109* 113* 100* 93 98  BUN 10 8 9 9 9   CREATININE 0.84 0.68 0.73 0.75 0.77  CALCIUM 8.4* 7.8* 8.2* 8.3* 8.4*    Liver Function Tests:  Recent Labs Lab 09/10/15 0415 09/11/15 0419 09/12/15 0450  AST 28 30 27   ALT 28 32 29  ALKPHOS 53 60 56  BILITOT 1.0 1.0 0.9  PROT 5.8* 5.4* 5.2*  ALBUMIN 2.9* 2.7* 2.6*   No results for input(s): LIPASE, AMYLASE in the last 168 hours. No results for input(s): AMMONIA in the last 168 hours.  CBC:  Recent Labs Lab 09/12/15 0450 09/13/15 0340 09/14/15 0350 09/15/15 0336 09/16/15 0415  WBC 6.1 6.2 5.5 4.5 4.7  HGB 8.2* 8.0* 7.8* 7.9* 8.6*  HCT 26.6* 25.8* 26.1* 26.3* 28.6*  MCV 86.9 86.6 86.4 86.8 86.7  PLT 230 213 225 201 200    Cardiac Enzymes: No results for input(s):  CKTOTAL, CKMB, CKMBINDEX, TROPONINI in the last 168 hours.  BNP: BNP (last 3 results)  Recent Labs  05/13/15 2248 08/21/15 1345 08/31/15 0400  BNP 1235.6* 2132.9* 1387.0*    ProBNP (last 3 results) No results for input(s): PROBNP in the last 8760 hours.    Other results:  Imaging: Dg Chest Port 1 View  09/15/2015  CLINICAL DATA:  Shortness of breath. EXAM: PORTABLE CHEST 1 VIEW COMPARISON:  09/13/2015 chest radiograph. FINDINGS: Stable configuration of 3 lead left subclavian ICD and left ventricular assist device. Sternotomy wires appear aligned and intact. Right PICC terminates in the lower third of the superior vena cava. Stable cardiomediastinal silhouette with mild-to-moderate cardiomegaly. No pneumothorax. No pleural effusion. No overt pulmonary edema. Stable mild bibasilar atelectasis. IMPRESSION: 1. Stable cardiomegaly without overt pulmonary edema. 2. Stable mild bibasilar atelectasis. Electronically Signed   By: Delbert Phenix M.D.   On: 09/15/2015 07:40     Medications:     Scheduled Medications: . amiodarone  200 mg Oral BID  . antiseptic oral rinse  7 mL Mouth Rinse q12n4p  . aspirin EC  81 mg Oral Daily  . bisacodyl  10 mg Oral Daily   Or  . bisacodyl  10 mg Rectal Daily  . chlorhexidine  15 mL Mouth Rinse BID  . citalopram  10 mg Oral Daily  . feeding supplement  1 Container Oral TID BM  . ferrous fumarate-b12-vitamic C-folic acid  1 capsule Oral TID PC  . mirtazapine  15 mg Oral QHS  . OLANZapine  5 mg Oral QHS  . pantoprazole  40 mg Oral Daily  . sodium chloride flush  10-40 mL Intracatheter Q12H  . sodium chloride flush  3 mL Intravenous Q12H  . tamsulosin  0.4 mg Oral Daily  . Warfarin - Pharmacist Dosing Inpatient   Does not apply q1800    Infusions: . sodium chloride Stopped (08/31/15 1700)  . sodium chloride 250 mL (08/31/15 0540)  . sodium chloride 10 mL/hr (09/08/15 1609)  . sodium chloride 10 mL/hr at 09/14/15 2000  . lactated ringers Stopped (08/30/15 1500)  . lactated ringers Stopped (09/02/15 1200)    PRN Medications: sodium chloride, antiseptic oral rinse, docusate sodium, fentaNYL (SUBLIMAZE) injection, ondansetron (ZOFRAN) IV, polyvinyl alcohol, sodium chloride flush, sodium chloride flush, traMADol   Assessment:   1. 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 EF 15%. Cath 8/14 with normal cors. 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  2. Frequent PVCs 3. Severe Malnutrition- Prealbumin 14.7 on 6/20 4. Acute post-op delirium/sundowning 5. Urinary Retention requiring Foley 6. Left pleural effusion s/p chest tube   Plan/Discussion:    Mental status continues to improve. Continue Zyprexa and Celexa.  Co-ox 70% off milrinone. Volume status stable.  MAP 70s. Continue to hold lasix.   INR 1.94.  Pharmacy dosing coumadin.    Leave Foley in for urinary retention. Continue Flomax.   Will need ramp echo today.    VAD parameters reviewed.  Isolated power spike noted (10.4). No HF symptoms. INR and LDH stable. VAD coordinators will send event log in for evaluation. ? Pump burn in .   Continue to work with PT and ambulate. Encourage oral intake. PT recommending CIR.   Move to 2W circle bed today. CIR later this week.    Length of Stay: 71  Edgar Clegg, [name] 7:51 AM   Patient seen with NP, agree with the above note.  Mentally clear, walking around unit.  Doing considerably  better than when I last saw him (1 week ago). He does not look volume overloaded on exam.  MAP stable.    He had an isolated power spike on 7/9 and +++ flow noted when he lies on his right.  ?Burning in phenomenon.  LDH stable.   - Plan ramp echo today.   Coumadin to be dosed by pharmacy.   [name] 09/16/2015 8:08 AM

## 2015-09-16 NOTE — Progress Notes (Signed)
Pt transferred to 2W24; pt ambulated entire distance without any difficulty; daughter and wife along for transfer; SCD's, pt belongings, and chart along for transfer; RN and NT in room upon transfer; pt assisted to chair; will cont. To monitor.  [name]

## 2015-09-16 NOTE — Progress Notes (Signed)
Speed  Flow  PI  Power  LVIDD  AI  Aortic openings  MR  TR  Septum  RV   9000 4.5 5.2 5.0 5.44 trace Small opening 5/5 trace mild midline Mild/mod dilated. Mild HK  9200  4.5 5.4 5.2 5.3 trace Small  opening 5/5 trace mild midline   9400  5.1 5.8 5.5 5.3  0/5   midline   9600  5.4 5.3 6.2 5.2  0/5   Midline to left                               Doppler MAP: 70 Auto cuff BP:  87/76 (82)  Intermittent non-sustained elevated flows and powers beginning 09/10/15.    Ramp ECHO performed at bedside per Dr. Shirlee Latch.  At completion of ramp study, patients primary and back up controller programmed:  Fixed speed:  9400 Low speed limit:  8800

## 2015-09-16 NOTE — Progress Notes (Signed)
Admitted 08/21/15 due to acute on chronic systolic heart failure; severe NICM.  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 Maren Beach as Destination Therapy VAD.  POD 18. Course prolonged d/t deconditioning and malnutrition as well as pleural effusion requiring CT insertion.   Vital signs: HR: 89 Doppler MAP: 89   Automated BP:  91/71-modified systolic O2 Sat: 585% on RA Wt in lbs:  158 > 199 > 188 > 187 > 184 > 180 > 182 > 168 > 169 lbs   LVAD interrogation reveals:  Speed: 9400 Flow: 5.1 Power: 5.3w PI: 5.4 Alarms: none Events:  none Fixed speed:  9400 Low speed limit: 8800  Drive Line:  C/D/I. Being maintained daily with gauze dressing and aquacel silver strip. Wife was has been doing the dressing change at the bedside. VAD Coordinator to change dressing tomorrow to determine if we need to remove distal suture and drainage.   Labs:  LDH trend:  255 > 281 > 256 > 263 > 273 > 263 > 252 > 236 > 218  INR trend: 1.45 > 1.48 > 1.42 > 1.38 > 1.85 > 2.53 > 2.5 > 3.38 > 2.65 > 2.35  Hgb:  9.5 > 8.4 > 8.2 > 8.0 > 7.8 > 9.0 > 9.6 > 9.0 > 8.3 > 8.2   Antithrombotic Management:   INR goal: 2.0 - 2.5 Warfarin - started 08/31/15; reached therapeutic range 09/04/15; stopped on 7/3; started back 7/6 pt received 2.5 mg  ASA 325 mg - started 09/02/15; dropped to 81 mg after INR reached therapeutic range Heparin gtt - started 09/02/15; stopped 09/03/15   Infusions: Milrinone - 0.125 Amiodarone - 400 mg BID Dopamine -  Stopped 09/05/15 Epi - stopped 09/02/15 Levo - stopped 09/01/15  OR Blood Products:  4 units FFP 2 unit plts  ICU Blood Products:  09/02/15 - 1 unit PCs  Ventilator: Extubated 08/31/15 to 5L [place] and 2ppm NO  NO - off 09/01/15  Stool: 09/11/15-normal BM  Chest tube still in place has drained approx 1700.  Plan/Recommendations:  1.  Confusion appears to be improved. Wife to go home to sleep tonight and daughters will stay with him.   2.  PT is recommending CIR.  Await for their evaluation to determine disposition. Wife is very hopeful to have him participate in the program here.   3. Home equipment to be ordered tomorrow. Unsure of the timing for CIR - family unable to meet until Friday for teaching session. Hopefully first of next week.   4. Appetite improving. Still with altered taste and only wants very specific things - encouraged family to continue bringing him meals he desires.    [name], RN VAD Coordinator   Office: [phone] 24/7 Emergency VAD Pager: [phone]

## 2015-09-16 NOTE — Progress Notes (Signed)
ANTICOAGULATION CONSULT NOTE - Follow Up Consult  Pharmacy Consult for Coumadin Indication: LVAD  Allergies  Allergen Reactions  . Phenergan [Promethazine Hcl] Nausea And Vomiting  . Lasix [Furosemide] Other (See Comments)    "Dropped blood pressure too low"  . Morphine And Related Other (See Comments)    Goes crazy     Patient Measurements: Height: 6\' 2"  (188 cm) Weight: 170 lb 13.7 oz (77.5 kg) IBW/kg (Calculated) : 82.2  Vital Signs: Temp: 98.4 F (36.9 C) (07/10 0000) Temp Source: Oral (07/10 0000) BP: 96/82 mmHg (07/10 0400) Pulse Rate: 70 (07/10 0700)  Labs:  Recent Labs  09/14/15 0350 09/15/15 0336 09/16/15 0415  HGB 7.8* 7.9* 8.6*  HCT 26.1* 26.3* 28.6*  PLT 225 201 200  LABPROT 24.6* 23.7* 22.0*  INR 2.24* 2.13* 1.94*  CREATININE 0.73 0.75 0.77    Estimated Creatinine Clearance: 90.1 mL/min (by C-G formula based on Cr of 0.77).  Assessment: 74yom s/p LVAD 6/23 continues on coumadin (started low dose per MD on 6/24, pharmacy dosing starting 7/3). INR trended up to 3.38 and doses held 7/4 and 7/5.   INR falling after doses held last week, 1.9 this am but watching trends seems to bump too much with 4 and 5mg . Hgb low but improved this morning without overt bleeding noted. Had chest tube placed for left pleural effusion. Continues on po amiodarone. Starting to eat more and Boost drink started 7/3 may need to increase doses of warfarin as he improves.  LDH slight trend up to 237.   Goal of Therapy:  INR 2-2.5 Monitor platelets by anticoagulation protocol: Yes   Plan:  1) Coumadin 4mg  again tonight - will go to 5 if down in am 2) Daily INR 3) Plan to remove wires today  Sheppard Coil PharmD., BCPS Clinical Pharmacist Pager [phone] 09/16/2015 8:03 AM

## 2015-09-16 NOTE — Progress Notes (Addendum)
Physical Therapy Treatment Patient Details Name: [name] MRN: [id number] DOB: [birth year] Today's Date: 09/16/2015    History of Present Illness Pt is a 75 y/o M who was admitted on 08/21/2015 from [facility] with acute on chronic systolic heart failure.Pt had LVAD placed 6/23. Post op course complicated by delirium and confusion. Pt's PMH inlcudes frequent PVCs, nonischemic cardiomyopathy, automatic ICD.     PT Comments    Pt continues to progress with mobility but continues to cognitive issues. Had wife change pt from power supply to batteries and then back to power supply. Wife very nervous. Reassured wife to take her time with the change and that there was no need to rush. Wife able to complete the change with verbal cues.   Follow Up Recommendations  CIR     Equipment Recommendations  None recommended by PT    Recommendations for Other Services       Precautions / Restrictions Precautions Precautions: Sternal;Other (comment) Precaution Comments: LVAD     Mobility  Bed Mobility Overal bed mobility: Needs Assistance Bed Mobility: Sit to Sidelying         Sit to sidelying: Mod assist General bed mobility comments: Verbal cues to lie down on elbow and assist to bring feet up into bed.  Transfers Overall transfer level: Needs assistance Equipment used: 4-wheeled walker Transfers: Sit to/from Stand Sit to Stand: Min assist;+2 physical assistance         General transfer comment: Assist to bring hips up. Had pt place hands on knees  Ambulation/Gait Ambulation/Gait assistance: Min assist Ambulation Distance (Feet): 620 Feet Assistive device: 4-wheeled walker Gait Pattern/deviations: Step-through pattern;Decreased stride length;Trunk flexed   Gait velocity interpretation: at or above normal speed for age/gender General Gait Details: Pt able to [facility] rollator without assist. Able to amb entire distance without rest break although dyspnea  2/4 after returning to room. Verbal cues to stand more erect.  When backing up to bed rollator became hung on recliner. Assisted pt to move it and pt with loss of balance forward.   Stairs            Wheelchair Mobility    Modified Rankin (Stroke Patients Only)       Balance Overall balance assessment: Needs assistance Sitting-balance support: No upper extremity supported;Feet supported Sitting balance-Leahy Scale: Fair     Standing balance support: Bilateral upper extremity supported Standing balance-Leahy Scale: Poor Standing balance comment: Stands with rollator and min guard for static standing.                    Cognition Arousal/Alertness: Awake/alert Behavior During Therapy: WFL for tasks assessed/performed Overall Cognitive Status: Impaired/Different from baseline Area of Impairment: Orientation;Attention;Memory;Following commands;Safety/judgement;Problem solving Orientation Level: Disoriented to;Place;Time;Situation Current Attention Level: Sustained Memory: Decreased recall of precautions;Decreased short-term memory Following Commands: Follows one step commands consistently Safety/Judgement: Decreased awareness of safety;Decreased awareness of deficits Awareness: Intellectual Problem Solving: Slow processing;Decreased initiation;Difficulty sequencing;Requires verbal cues;Requires tactile cues      Exercises      General Comments        Pertinent Vitals/Pain Pain Assessment: No/denies pain    Home Living                      Prior Function            PT Goals (current goals can now be found in the care plan section) Progress towards PT goals: Progressing toward goals  Frequency  Min 3X/week    PT Plan Current plan remains appropriate    Co-evaluation             End of Session   Activity Tolerance: Patient tolerated treatment well Patient left: with call bell/phone within reach;in bed;with family/visitor  present     Time: 0936-1010 PT Time Calculation (min) (ACUTE ONLY): 34 min  Charges:  $Gait Training: 8-22 mins $Self Care/Home Management: 8-22                    G Codes:      Cyniah Gossard 10-09-15, 2:10 PM [employer] PT [phone]

## 2015-09-16 NOTE — Progress Notes (Signed)
EPW removed at this time; pt tolerated well; VSS: bedrest until 1130; wife at bedside; will cont. To monitor.  Benjamine Sprague

## 2015-09-16 NOTE — Progress Notes (Addendum)
VAD coordinator paged about elevated flows and power. Flow maintaining 7.8-8.5, power 6.5-8, PI unchanged remaining around 5.5-6. +++ displayed twice in a 20 min time period with decrease to 4.0 in PI. All patient vital signs remain unchanged. VAD team aware of elevation and spoke with HF team NP today about issues earlier today and will be by in the morning to download patient history. Will continue to monitor patient closely.  Harvin Hazel, RN

## 2015-09-16 NOTE — Progress Notes (Signed)
Wife present at bedside, patient has no needs at this time. Call light within reach.

## 2015-09-17 DIAGNOSIS — Z9889 Other specified postprocedural states: Secondary | ICD-10-CM

## 2015-09-17 DIAGNOSIS — I447 Left bundle-branch block, unspecified: Secondary | ICD-10-CM

## 2015-09-17 DIAGNOSIS — R7303 Prediabetes: Secondary | ICD-10-CM | POA: Insufficient documentation

## 2015-09-17 DIAGNOSIS — M159 Polyosteoarthritis, unspecified: Secondary | ICD-10-CM | POA: Insufficient documentation

## 2015-09-17 LAB — BASIC METABOLIC PANEL
Anion gap: 6 (ref 5–15)
BUN: 11 mg/dL (ref 6–20)
CALCIUM: 8.6 mg/dL — AB (ref 8.9–10.3)
CO2: 27 mmol/L (ref 22–32)
CREATININE: 0.77 mg/dL (ref 0.61–1.24)
Chloride: 106 mmol/L (ref 101–111)
GFR calc non Af Amer: >60 mL/min (ref >60)
GLUCOSE: 98 mg/dL (ref 65–99)
Potassium: 3.9 mmol/L (ref 3.5–5.1)
Sodium: 139 mmol/L (ref 135–145)

## 2015-09-17 LAB — CARBOXYHEMOGLOBIN
Carboxyhemoglobin: 2 % — ABNORMAL HIGH (ref 0.5–1.5)
Methemoglobin: 0.7 % (ref 0.0–1.5)
O2 Saturation: 61.9 %
Total hemoglobin: 8.9 g/dL — ABNORMAL LOW (ref 13.5–18.0)

## 2015-09-17 LAB — CBC
HCT: 28.3 % — ABNORMAL LOW (ref 39.0–52.0)
Hemoglobin: 8.3 g/dL — ABNORMAL LOW (ref 13.0–17.0)
MCH: 25.5 pg — AB (ref 26.0–34.0)
MCHC: 29.3 g/dL — AB (ref 30.0–36.0)
MCV: 87.1 fL (ref 78.0–100.0)
PLATELETS: 190 10*3/uL (ref 150–400)
RBC: 3.25 MIL/uL — AB (ref 4.22–5.81)
RDW: 17.1 % — AB (ref 11.5–15.5)
WBC: 4.1 10*3/uL (ref 4.0–10.5)

## 2015-09-17 LAB — PROTIME-INR
INR: 1.81 — ABNORMAL HIGH (ref 0.00–1.49)
PROTHROMBIN TIME: 21 s — AB (ref 11.6–15.2)

## 2015-09-17 LAB — GLUCOSE, CAPILLARY
GLUCOSE-CAPILLARY: 130 mg/dL — AB (ref 65–99)
Glucose-Capillary: 122 mg/dL — ABNORMAL HIGH (ref 65–99)

## 2015-09-17 LAB — LACTATE DEHYDROGENASE: LDH: 210 U/L — AB (ref 98–192)

## 2015-09-17 MED ORDER — SPIRONOLACTONE 25 MG PO TABS
12.5000 mg | ORAL_TABLET | Freq: Every day | ORAL | Status: DC
  Administered 2015-09-17: 12.5 mg via ORAL
  Filled 2015-09-17 (×2): qty 1

## 2015-09-17 MED ORDER — PATIENT'S GUIDE TO USING COUMADIN BOOK
Freq: Once | Status: AC
  Administered 2015-09-17: 18:00:00
  Filled 2015-09-17: qty 1

## 2015-09-17 MED ORDER — WARFARIN SODIUM 5 MG PO TABS
5.0000 mg | ORAL_TABLET | Freq: Once | ORAL | Status: AC
  Administered 2015-09-17: 5 mg via ORAL
  Filled 2015-09-17: qty 1

## 2015-09-17 NOTE — Progress Notes (Signed)
Utilization review completed.  

## 2015-09-17 NOTE — Progress Notes (Signed)
During shift change RN noted PI at 2.2. PI then came up to 3.0.  PI now 3.8-4.0. MAP 82. Pt resting in bed slightly to left side, no complaints at this time. RN paged VAD pager related to PI change. Judeth Cornfield, RN aware of changes. Bedside RN informed to continue to monitor pt.  Recruitment consultant and daughter at bedside. RN will continue to monitor pt.  Roselie Awkward, RN

## 2015-09-17 NOTE — Progress Notes (Signed)
ANTICOAGULATION CONSULT NOTE - Follow Up Consult  Pharmacy Consult for Coumadin Indication: LVAD  Allergies  Allergen Reactions  . Phenergan [Promethazine Hcl] Nausea And Vomiting  . Lasix [Furosemide] Other (See Comments)    "Dropped blood pressure too low"  . Morphine And Related Other (See Comments)    Goes crazy     Patient Measurements: Height: 6\' 2"  (188 cm) Weight: 174 lb 9.7 oz (79.2 kg) IBW/kg (Calculated) : 82.2  Vital Signs: Temp: 98.8 F (37.1 C) (07/11 0445) Temp Source: Oral (07/11 0445) BP: 88/75 mmHg (07/11 0400) Pulse Rate: 70 (07/11 0445)  Labs:  Recent Labs  09/15/15 0336 09/16/15 0415 09/17/15 0425  HGB 7.9* 8.6* 8.3*  HCT 26.3* 28.6* 28.3*  PLT 201 200 190  LABPROT 23.7* 22.0* 21.0*  INR 2.13* 1.94* 1.81*  CREATININE 0.75 0.77 0.77    Estimated Creatinine Clearance: 92.1 mL/min (by C-G formula based on Cr of 0.77).  Assessment: 74yom s/p LVAD 6/23 continues on coumadin (started low dose per MD on 6/24, pharmacy dosing starting 7/3). INR trended up to 3.38 and doses held 7/4 and 7/5.   INR falling after doses held last week, 1.8 this am but watching trends seems to bump too much with 4 and 5mg . Hgb low but stable this morning without overt bleeding noted. Continues on po amiodarone.  Starting to eat more and Boost drink started 7/3 may need to increase doses of warfarin as he improves.  LDH stable 210  Goal of Therapy:  INR 2-2.5 Monitor platelets by anticoagulation protocol: Yes   Plan:  1) Coumadin 5mg  again tonight  2) Daily INR  Sheppard Coil PharmD., BCPS Clinical Pharmacist Pager [phone] 09/17/2015 10:26 AM

## 2015-09-17 NOTE — Progress Notes (Addendum)
Physical Therapy Treatment Patient Details Name: [name] MRN: [id number] DOB: [birth year] Today's Date: 09/17/2015    History of Present Illness Pt is a 75 y/o M who was admitted on 08/21/2015 from the [facility] with acute on chronic systolic heart failure.Pt had LVAD placed 6/23. Post op course complicated by delirium and confusion. Pt's PMH inlcudes frequent PVCs, nonischemic cardiomyopathy, automatic ICD.     PT Comments    Pt continues to have difficulty getting out of bed and coming to stand. Wife changed pt over to batteries and was able to perform without cues today.   Follow Up Recommendations  CIR     Equipment Recommendations  None recommended by PT    Recommendations for Other Services       Precautions / Restrictions Precautions Precautions: Sternal;Other (comment) Precaution Comments: LVAD     Mobility  Bed Mobility Overal bed mobility: Needs Assistance Bed Mobility: Supine to Sit     Supine to sit: +2 for physical assistance;Mod assist     General bed mobility comments: Assist to move legs off bed and elevate trunk into sitting.  Transfers Overall transfer level: Needs assistance Equipment used: 4-wheeled walker Transfers: Sit to/from Stand Sit to Stand: Min assist;+2 physical assistance         General transfer comment: Assist to bring hips up. Had pt place hands on knees  Ambulation/Gait Ambulation/Gait assistance: Min assist Ambulation Distance (Feet): 400 Feet Assistive device: 4-wheeled walker Gait Pattern/deviations: Step-through pattern;Decreased stride length;Drifts right/left;Trunk flexed Gait velocity: Too fast for his abilities Gait velocity interpretation: at or above normal speed for age/gender General Gait Details: Pt drifting left with rollator and needed cues to correct. Verbal cues to stand more erect and slow down. Pt required 1 standing rest break.   Stairs            Wheelchair Mobility     Modified Rankin (Stroke Patients Only)       Balance Overall balance assessment: Needs assistance Sitting-balance support: No upper extremity supported Sitting balance-Leahy Scale: Fair     Standing balance support: Bilateral upper extremity supported Standing balance-Leahy Scale: Poor Standing balance comment: Stands with rollator and min guard assist for static standing                    Cognition Arousal/Alertness: Awake/alert Behavior During Therapy: WFL for tasks assessed/performed Overall Cognitive Status: Impaired/Different from baseline Area of Impairment: Orientation;Attention;Memory;Following commands;Safety/judgement;Problem solving Orientation Level: Disoriented to;Time;Situation Current Attention Level: Sustained Memory: Decreased recall of precautions;Decreased short-term memory Following Commands: Follows one step commands consistently Safety/Judgement: Decreased awareness of safety;Decreased awareness of deficits Awareness: Intellectual Problem Solving: Slow processing;Decreased initiation;Difficulty sequencing;Requires verbal cues;Requires tactile cues      Exercises      General Comments        Pertinent Vitals/Pain Pain Assessment: No/denies pain    Home Living                      Prior Function            PT Goals (current goals can now be found in the care plan section) Progress towards PT goals: Progressing toward goals    Frequency  Min 3X/week    PT Plan Current plan remains appropriate    Co-evaluation             End of Session   Activity Tolerance: Patient tolerated treatment well Patient left: with call bell/phone within reach;with family/visitor present;in  chair;with chair alarm set     Time: [phone] PT Time Calculation (min) (ACUTE ONLY): 29 min  Charges:  $Gait Training: 23-37 mins                    G Codes:      Syretta Kochel September 23, 2015, 10:17 AM Skip Mayer PT [phone]

## 2015-09-17 NOTE — Consult Note (Signed)
   [facility] CM Inpatient Consult   09/17/2015  TRACER PALMS [birth year] [id number]  Patient screened for potential [facility] Care Management services. Patient is eligible for [facility] Care Management services under patient's Medicare  plan. Per chart review patient is a 75 yo male with a H/O cardiomyopathy and LBBB with a declining EF patient was admitted on 08/21/2015 from the Heart Failure Clinic with acute on chronic systolic heart failure.Pt had LVAD placed 6/23. Also, according to medical record patient and family is pursuing inpatient rehab at this time and not currently ready for community follow up and community care management will not be appropriate at this time.  Please place a [facility] Care Management consult if patient's needs changes or for questions contact:   Charlesetta Shanks, RN BSN CCM [facility]  [phone] business mobile phone Toll free office [phone]

## 2015-09-17 NOTE — Consult Note (Signed)
Physical Medicine and Rehabilitation Consult Reason for Consult: Deconditioning related to acute on chronic systolic congestive heart failure Referring Physician: Dr. Zenaida Niece trigt   HPI: [name] is a 75 y.o. right handed male with history of cardiomyopathy and left bundle branch block. Per chart review patient is married and independent prior to admission. 2 level home with bedroom on main floor. 3 steps to entry. Family can assist as needed. Presented 08/21/2015 through the heart failure clinic with dizziness and low volume output. 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 Found to be in cardiogenic shock. Workup per cardiology services and cardiothoracic surgery patient underwent LVAD 08/30/2015 per Dr. Zenaida Niece trigt. He was extubated 08/31/2015. Received chest tube placement 09/12/2015 for left pleural effusion drained 1650 mL initially. Bouts of confusion with therapies initiated. Patient presently maintained on aspirin and Coumadin therapy. Acute on chronic anemia 8.3 and monitored. Physical therapy evaluations completed and ongoing with recommendations of physical medicine rehabilitation consult.   Review of Systems  Constitutional: Positive for malaise/fatigue. Negative for fever and chills.  HENT: Negative for hearing loss.   Eyes: Negative for blurred vision and double vision.  Respiratory: Positive for shortness of breath. Negative for cough.   Cardiovascular: Negative for chest pain.  Gastrointestinal: Positive for constipation. Negative for nausea.       GERD  Genitourinary: Positive for urgency. Negative for dysuria and hematuria.  Musculoskeletal: Positive for myalgias.  Neurological: Positive for weakness. Negative for seizures and headaches.  Psychiatric/Behavioral: The patient has insomnia.   All other systems reviewed and are negative.  Past Medical History  Diagnosis Date  . Nephrolithiasis   . GERD (gastroesophageal  reflux disease)   . Insomnia   . Nonischemic cardiomyopathy (HCC)     Cardiac catheterization 12/2011 with LVEF 45%, no significant obstructive CAD  . Automatic implantable cardioverter-defibrillator in situ   . H/O hiatal hernia   . Osteoarthritis   . Pneumonia     Dec 2016   Past Surgical History  Procedure Laterality Date  . Exploratory laparotomy  2006?  Marland Kitchen Appendectomy    . Volvulus reduction  2007  . Posterior fusion cervical spine  2001  . Bi-ventricular implantable cardioverter defibrillator  (crt-d)  12/14/2012  . Cardiac catheterization  2003?; ~ 11/2012  . Knee arthroscopy Left 1990's  . Lithotripsy      "twice" (12/14/2012)  . Cystoscopy w/ stone manipulation      "3-4 times" (12/14/2012)  . Bi-ventricular implantable cardioverter defibrillator N/A 12/14/2012    Procedure: BI-VENTRICULAR IMPLANTABLE CARDIOVERTER DEFIBRILLATOR  (CRT-D);  Surgeon: Duke Salvia, MD;  Location: [facility] CATH LAB;  Service: Cardiovascular;  Laterality: N/A;  . Cataract extraction w/phaco Left 05/13/2015    Procedure: CATARACT EXTRACTION PHACO AND INTRAOCULAR LENS PLACEMENT LEFT EYE;  CDE:  3.49;  Surgeon: Susa Simmonds, MD;  Location: [facility] ORS;  Service: Ophthalmology;  Laterality: Left;  . Cataract extraction w/phaco Right 07/22/2015    Procedure: CATARACT EXTRACTION PHACO AND INTRAOCULAR LENS PLACEMENT RIGHT EYE CDE=4.50;  Surgeon: Susa Simmonds, MD;  Location: [facility] ORS;  Service: Ophthalmology;  Laterality: Right;  . Cardiac catheterization N/A 08/22/2015    Procedure: Right/Left Heart Cath and Coronary Angiography;  Surgeon: Dolores Patty, MD;  Location: [facility] INVASIVE CV LAB;  Service: Cardiovascular;  Laterality: N/A;  . Insertion of implantable left ventricular assist device N/A 08/30/2015    Procedure: INSERTION OF IMPLANTABLE LEFT VENTRICULAR ASSIST DEVICE;  Surgeon: Kerin Perna, MD;  Location: [facility] OR;  Service: Open Heart Surgery;  Laterality: N/A;  . Intraoperative transesophageal  echocardiogram N/A 08/30/2015    Procedure: INTRAOPERATIVE TRANSESOPHAGEAL ECHOCARDIOGRAM;  Surgeon: Kerin Perna, MD;  Location: [facility] OR;  Service: Open Heart Surgery;  Laterality: N/A;   Family History  Problem Relation Age of Onset  . Cancer - Prostate Maternal Uncle   . Heart failure Mother   . Stroke Mother   . Asthma Mother   . Heart disease Father   . Heart failure Father   . Congestive Heart Failure Brother     @ 62 Heart transplant, [place]  . Congestive Heart Failure Brother     ICD  . Heart failure Sister     ICD  . Cancer - Ovarian Sister     deceased @ 50   Social History:  reports that he has never smoked. His smokeless tobacco use includes Chew. He reports that he does not drink alcohol or use illicit drugs. Allergies:  Allergies  Allergen Reactions  . Phenergan [Promethazine Hcl] Nausea And Vomiting  . Lasix [Furosemide] Other (See Comments)    "Dropped blood pressure too low"  . Morphine And Related Other (See Comments)    Goes crazy    Medications Prior to Admission  Medication Sig Dispense Refill  . Calcium Carbonate-Vitamin D (CALTRATE 600+D PO) Take 1 tablet by mouth daily.    . Carboxymethylcellulose Sodium (THERATEARS OP) Apply 1 drop to eye 3 (three) times daily as needed (dry eyes).     Marland Kitchen Cod Liver Oil 1000 MG CAPS Take 1 capsule by mouth daily.    . digoxin (LANOXIN) 0.125 MG tablet Take 0.125 mg by mouth daily. Take 0.5 tablet by mouth each morning.    . escitalopram (LEXAPRO) 10 MG tablet Take 10 mg by mouth daily.    . furosemide (LASIX) 20 MG tablet Take 1 tablet (20 mg total) by mouth daily. 90 tablet 3  . Glucosamine Sulfate 1000 MG CAPS Take 1,000 mg by mouth daily.    . ivabradine (CORLANOR) 5 MG TABS tablet Take 1 tablet (5 mg total) by mouth 2 (two) times daily with a meal. 60 tablet 6  . ketorolac (ACULAR) 0.5 % ophthalmic solution Place 1 drop into the left eye 2 (two) times daily.     . Multiple Vitamin (MULTIVITAMIN) tablet Take 1 tablet by  mouth daily.    . pantoprazole (PROTONIX) 40 MG tablet Take 40 mg by mouth daily.    . potassium chloride SA (K-DUR,KLOR-CON) 20 MEQ tablet Take 20 mEq by mouth daily.    . prednisoLONE acetate (PRED FORTE) 1 % ophthalmic suspension Place 1 drop into the left eye 4 (four) times daily.    . Psyllium (METAMUCIL PO) Take 1 tablet by mouth daily.    Marland Kitchen spironolactone (ALDACTONE) 25 MG tablet Take 1 tablet (25 mg total) by mouth daily. 90 tablet 3  . vitamin A 8000 UNIT capsule Take 8,000 Units by mouth 3 (three) times a week.     . vitamin B-12 (CYANOCOBALAMIN) 100 MCG tablet Take 100 mcg by mouth daily.    . vitamin C (ASCORBIC ACID) 500 MG tablet Take 500 mg by mouth every other day.     . zolpidem (AMBIEN) 10 MG tablet Take 10 mg by mouth at bedtime as needed for sleep (Take 0.5 to 1.0 tablet by mouth as needed for insomnia.).      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  Was beginning to feel SOB after ambulating ~300-400 ft. Functional Status:  Mobility: Bed Mobility Overal bed mobility: Needs Assistance Bed Mobility: Supine to Sit Supine to sit: +2 for physical assistance, Mod assist Sit to supine: Max assist Sit to sidelying: Mod assist General bed mobility comments: Assist to move legs off bed and elevate trunk into  sitting. Transfers Overall transfer level: Needs assistance Equipment used: 4-wheeled walker Transfer via Lift Equipment: Stedy Transfers: Sit to/from Stand Sit to Stand: Min assist, +2 physical assistance Stand pivot transfers: Min assist, +2 physical assistance General transfer comment: Assist to bring hips up. Had pt place hands on knees Ambulation/Gait Ambulation/Gait assistance: Min assist Ambulation Distance (Feet): 400 Feet Assistive device: 4-wheeled walker Gait Pattern/deviations: Step-through pattern, Decreased stride length, Drifts right/left, Trunk flexed General Gait Details: Pt drifting left with rollator and needed cues to correct. Verbal cues to stand more erect and slow down. Pt required 1 standing rest break. Gait velocity: Too fast for his abilities Gait velocity interpretation: at or above normal speed for age/gender    ADL: ADL Overall ADL's : Needs assistance/impaired Eating/Feeding: Moderate assistance Grooming: Supervision/safety, Set up, Sitting Grooming Details (indicate cue type and reason): Pt able to wash face and comb hair after set up and continued VC. Pt then using comb to "rub lotion on his face". Upper Body Bathing: Minimal assitance, Sitting Lower Body Bathing: Moderate assistance, Sit to/from stand Upper Body Dressing : Moderate assistance, Sitting Lower Body Dressing: Moderate assistance, Sit to/from stand Toilet Transfer: Minimal assistance, +2 for physical assistance, Stand-pivot, BSC, RW Toileting- Clothing Manipulation and Hygiene: Moderate assistance, Sit to/from stand, +2 for safety/equipment Functional mobility during ADLs: Minimal assistance, +2 for physical assistance, Rolling walker General ADL Comments: Pt requires increased time to complete tasks.  Intermittent confusion noted.  Pt not yet able to manage power/battery connections.  Pt ambulated 500'   Cognition: Cognition Overall Cognitive Status: Impaired/Different from  baseline Orientation Level: Oriented X4 Cognition Arousal/Alertness: Awake/alert Behavior During Therapy: WFL for tasks assessed/performed Overall Cognitive Status: Impaired/Different from baseline Area of Impairment: Orientation, Attention, Memory, Following commands, Safety/judgement, Problem solving Orientation Level: Disoriented to, Time, Situation Current Attention Level: Sustained Memory: Decreased recall of precautions, Decreased short-term memory Following Commands: Follows one step commands consistently Safety/Judgement: Decreased awareness of safety, Decreased awareness of deficits Awareness: Intellectual Problem Solving: Slow processing, Decreased initiation, Difficulty sequencing, Requires verbal cues, Requires tactile cues General Comments: motor plannind deficits also noted   Blood pressure 88/75, pulse 70, temperature 98.8 F (37.1 C), temperature source Oral, resp. rate 16, height 6\' 2"  (1.88 m), weight 79.2 kg (174 lb 9.7 oz), SpO2 98 %. Physical Exam  Vitals reviewed. Constitutional: He is oriented to person, place, and time. He appears well-developed and well-nourished.  HENT:  Head: Normocephalic and atraumatic.  Eyes: Conjunctivae and EOM are normal.  Neck: Normal range of motion. Neck supple.  Cardiovascular: Normal rate and regular rhythm.   +Hum  Respiratory: Effort normal and breath  sounds normal.  GI: Soft. Bowel sounds are normal.  Musculoskeletal: He exhibits no edema or tenderness.  Neurological: He is alert and oriented to person, place, and time.  Fair but limited awareness of deficits. Sensation intact to light touch Motor: 4/5 grossly throughout  Skin: Skin is warm and dry.  Psychiatric: He has a normal mood and affect.  ?Slightly altered    Results for orders placed or performed during the hospital encounter of 08/21/15 (from the past 24 hour(s))  Glucose, capillary     Status: Abnormal   Collection Time: 09/16/15 11:31 AM  Result Value Ref  Range   Glucose-Capillary 156 (H) 65 - 99 mg/dL   Comment 1 Notify RN   Glucose, capillary     Status: Abnormal   Collection Time: 09/16/15  3:42 PM  Result Value Ref Range   Glucose-Capillary 105 (H) 65 - 99 mg/dL  Lactate dehydrogenase     Status: Abnormal   Collection Time: 09/17/15  4:25 AM  Result Value Ref Range   LDH 210 (H) 98 - 192 U/L  Protime-INR     Status: Abnormal   Collection Time: 09/17/15  4:25 AM  Result Value Ref Range   Prothrombin Time 21.0 (H) 11.6 - 15.2 seconds   INR 1.81 (H) 0.00 - 1.49  Basic metabolic panel     Status: Abnormal   Collection Time: 09/17/15  4:25 AM  Result Value Ref Range   Sodium 139 135 - 145 mmol/L   Potassium 3.9 3.5 - 5.1 mmol/L   Chloride 106 101 - 111 mmol/L   CO2 27 22 - 32 mmol/L   Glucose, Bld 98 65 - 99 mg/dL   BUN 11 6 - 20 mg/dL   Creatinine, Ser 0.10 0.61 - 1.24 mg/dL   Calcium 8.6 (L) 8.9 - 10.3 mg/dL   GFR calc non Af Amer >60 >60 mL/min   GFR calc Af Amer >60 >60 mL/min   Anion gap 6 5 - 15  CBC     Status: Abnormal   Collection Time: 09/17/15  4:25 AM  Result Value Ref Range   WBC 4.1 4.0 - 10.5 K/uL   RBC 3.25 (L) 4.22 - 5.81 MIL/uL   Hemoglobin 8.3 (L) 13.0 - 17.0 g/dL   HCT 93.2 (L) 35.5 - 73.2 %   MCV 87.1 78.0 - 100.0 fL   MCH 25.5 (L) 26.0 - 34.0 pg   MCHC 29.3 (L) 30.0 - 36.0 g/dL   RDW 20.2 (H) 54.2 - 70.6 %   Platelets 190 150 - 400 K/uL   Dg Chest Port 1 View  09/16/2015  CLINICAL DATA:  LVAD.  Shortness of breath. EXAM: PORTABLE CHEST 1 VIEW COMPARISON:  09/15/2015 FINDINGS: LVAD and left AICD remain in place, unchanged. Right PICC line is unchanged. Cardiomegaly. Left lower lobe atelectasis and suspected small left effusion. No pneumothorax. IMPRESSION: Stable left base atelectasis.  Suspect small left effusion. Stable cardiomegaly. Electronically Signed   By: Charlett Nose M.D.   On: 09/16/2015 09:23    Assessment/Plan: Diagnosis: Deconditioning related to acute on chronic systolic congestive  heart failure Labs and images independently reviewed.  Records reviewed and summated above.  1. Does the need for close, 24 hr/day medical supervision in concert with the patient's rehab needs make it unreasonable for this patient to be served in a less intensive setting? Potentially  2. Co-Morbidities requiring supervision/potential complications: acute on chronic cardiomyopathy (Monitor in accordance with increased physical activity and avoid UE resistance excercises), left  bundle branch block (monitor with increased activity), LVAD, Acute on chronic anemia (transfuse if necessary to ensure appropriate perfusion for increased activity tolerance), generalized OA (ensure pain does not limit therapies), Prediabetes (Monitor in accordance with exercise and adjust meds as necessary), delrium (adjust sleep/wake cycle, avoid cognitively altering medications), malnutrition (supplment with protein) 3. Due to safety, skin/wound care, disease management and patient education, does the patient require 24 hr/day rehab nursing? Yes 4. Does the patient require coordinated care of a physician, rehab nurse, PT (1-2 hrs/day, 5 days/week) and OT (1-2 hrs/day, 5 days/week) to address physical and functional deficits in the context of the above medical diagnosis(es)? Yes Addressing deficits in the following areas: endurance, locomotion, strength, transferring, bathing, dressing, toileting, cognition and psychosocial support 5. Can the patient actively participate in an intensive therapy program of at least 3 hrs of therapy per day at least 5 days per week? Yes 6. The potential for patient to make measurable gains while on inpatient rehab is good and fair 7. Anticipated functional outcomes upon discharge from inpatient rehab are n/a  with PT, n/a with OT, n/a with SLP. 8. Estimated rehab length of stay to reach the above functional goals is: NA 9. Does the patient have adequate social supports and living environment to  accommodate these discharge functional goals? Yes 10. Anticipated D/C setting: Home 11. Anticipated post D/C treatments: HH therapy and Home excercise program 12. Overall Rehab/Functional Prognosis: good  RECOMMENDATIONS: This patient's condition is appropriate for continued rehabilitative care in the following setting: Pt ambulating 300-670ft with PT.  He will not require IRF services.  Would recommend home with [facility] when medically stable.  Patient has agreed to participate in recommended program. Yes Note that insurance prior authorization may be required for reimbursement for recommended care.  Comment: Rehab Admissions Coordinator to follow up.  Maryla Morrow, MD 09/17/2015

## 2015-09-17 NOTE — Progress Notes (Signed)
Occupational Therapy Treatment Patient Details Name: [name] MRN: [id number] DOB: [birth year] Today's Date: 09/17/2015    History of present illness Pt is a 75 y/o M who was admitted on 08/21/2015 from the Heart Failure Clinic with acute on chronic systolic heart failure.Pt had LVAD placed 6/23. Post op course complicated by delirium and confusion. Pt's PMH inlcudes frequent PVCs, nonischemic cardiomyopathy, automatic ICD.    OT comments  This 75 yo male admitted and underwent above presents to acute OT making progress with basic ADLs he will benefit from continued OT on CIR to get him to a S level to go home with wife.  Follow Up Recommendations  CIR;Supervision/Assistance - 24 hour    Equipment Recommendations  None recommended by OT    Recommendations for Other Services Rehab consult    Precautions / Restrictions Precautions Precautions: Sternal;Other (comment) Precaution Comments: LVAD  Restrictions Weight Bearing Restrictions: Yes Other Position/Activity Restrictions: sternal precautions       Mobility Bed Mobility Overal bed mobility: Needs Assistance Bed Mobility: Supine to Sit     Supine to sit: +2 for physical assistance;Mod assist     General bed mobility comments: Pt up in recliner upon my arrival  Transfers Overall transfer level: Needs assistance Equipment used: 4-wheeled walker Transfers: Sit to/from Stand Sit to Stand: Min assist;+2 physical assistance         General transfer comment: Assist to Ramsey hips up. Had pt place hands on knees    Balance Overall balance assessment: Needs assistance Sitting-balance support: No upper extremity supported Sitting balance-Leahy Scale: Fair     Standing balance support: Bilateral upper extremity supported Standing balance-Leahy Scale: Poor Standing balance comment: during session he was reliant on rollator in standing or one hand on sink while standing or leanging against sink while he brushed  his teeth                   ADL Overall ADL's : Needs assistance/impaired     Grooming: Wash/dry face;Oral care;Min guard;Standing Grooming Details (indicate cue type and reason): Pt brushed teeth and washed face. Needed VCs for finding toothpaste top and putting it back on toothpaste. Also was going to wash is face when a very dripping washcloth that he had not rung out--VCs to the need to wring it out first so as to not get water all over the place                 Toilet Transfer: Minimal assistance;+2 for physical assistance Toilet Transfer Details (indicate cue type and reason): simulated sit<>stand from recliner                            Cognition   Behavior During Therapy: [facility] for tasks assessed/performed Overall Cognitive Status: Impaired/Different from baseline Area of Impairment: Orientation;Attention;Memory;Following commands;Safety/judgement;Awareness;Problem solving Orientation Level:  (asked him how many years he had been married and he responded with year that he was married) Current Attention Level: Sustained Memory: Decreased recall of precautions;Decreased short-term memory  Following Commands: Follows one step commands inconsistently (needed more than one step commands for problem solving through how to manuever rollator to turn to sink and get it out of his way so he could stand at the sink to brush teeth) Safety/Judgement: Decreased awareness of deficits;Decreased awareness of safety Awareness: Intellectual Problem Solving: Slow processing;Difficulty sequencing;Requires verbal cues  Pertinent Vitals/ Pain       Pain Assessment: No/denies pain         Frequency Min 3X/week     Progress Toward Goals  OT Goals(current goals can now be found in the care plan section)  Progress towards OT goals: Progressing toward goals (able to stand at sink and work on grooming tasks v. sitting to do them)  ADL Goals Pt Will  Perform Grooming: with set-up;with supervision;standing (2 tasks at sink without VCs)  Plan Discharge plan remains appropriate       End of Session Equipment Utilized During Treatment:  (rollator)   Activity Tolerance Patient tolerated treatment well   Patient Left in chair;with call bell/phone within reach;with chair alarm set;with family/visitor present           Time: 8403-7543 OT Time Calculation (min): 33 min  Charges: OT General Charges $OT Visit: 1 Procedure OT Treatments $Self Care/Home Management : 23-37 mins  Evette Georges [phone] 09/17/2015, 11:13 AM

## 2015-09-17 NOTE — Progress Notes (Signed)
CARDIAC REHAB PHASE I   PRE:  Rate/Rhythm: 70 paced    BP: sitting 78    SaO2: 98 RA  MODE:  Ambulation: 840 ft   POST:  Rate/Rhythm: 87 paced    BP: sitting 96     SaO2: 97 RA  Pt stood with min-mod assist x2 and gait belt. Steady walking with rollator (assist x2 for safety) however needed reminders at times to stand tall or not walk too fast. Seemed to have good endurance however he was SOB once sitting after walk. VSS. Pt transferred himself from batteries to power cord with many verbal cues/instructions. Pt actually connected cords together without problems but he lacks understanding of where the cords go (i.e. Tried to plug battery cord into system controller panel). Daughter present. Will continue to follow. [id number]  Harriet Masson CES, ACSM 09/17/2015 2:08 PM

## 2015-09-17 NOTE — Progress Notes (Signed)
Patient ID: [name], male   DOB: 1941-01-27, 75 y.o.   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 Normal cors. EF 10% with low cardiac output.   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    Foley placed 7/3 for urinary retention. S/P CT with 1700 cc fluid out.   Ramp echo 7/10 with increase in speed to 9400 rpm.   Mental status continues to improved. Slept last night. On Zyprexa and Celexa. Continues to ambulate with PT.   Stable off milrinone. Denies SOB/pain. Was able to rest last night.   LDH 237>210  MAPs 70s  Flow: 5.2   L/min  Speed: 9400 PI 6.3  Power 6.  Occasional PI events. On 7/9 had Power Spike 10.4.               Objective:   Vital Signs: MAPs 70s   Temp:  [97.5 F (36.4 C)-98.8 F (37.1 C)] 98.8 F (37.1 C) (07/11 0445) Pulse Rate:  [59-123] 70 (07/11 0445) Resp:  [13-23] 16 (07/11 0445) BP: (83-101)/(66-87) 88/75 mmHg (07/11 0400) SpO2:  [94 %-100 %] 98 % (07/11 0445) Weight:  [174 lb 9.7 oz (79.2 kg)] 174 lb 9.7 oz (79.2 kg) (07/11 0445) Last BM Date: 09/11/15  Weight change: Filed Weights   09/15/15 0500 09/16/15 0600 09/17/15 0445  Weight: 171 lb 8.3 oz (77.8 kg) 170 lb 13.7 oz (77.5 kg) 174 lb 9.7 oz (79.2 kg)    Intake/Output:   Intake/Output Summary (Last 24 hours) at 09/17/15 1013 Last data filed at 09/17/15 0503  Gross per 24 hour  Intake    240 ml  Output    336 ml  Net    -96 ml    MAPS  80-90s   Physical Exam: General: In the chair.  NAD.  HEENT: normal  Neck: supple. RIJ TLC. JVP flat  Cor:  LVAD hum.  Lungs: LLL decreased Abdomen: soft, nondistended. No hepatosplenomegaly. Bowel sound present. Driveline site ok. Securement device in place.  Extremities: no cyanosis, clubbing, rash, R and LLE trce -1 + edema. RUE PICC Neuro:  Alert and oriented  GU: Foley.   Telemetry:  Sinus with PVCs  Labs: Basic Metabolic  Panel:  Recent Labs Lab 09/13/15 0815 09/14/15 0350 09/15/15 0336 09/16/15 0415 09/17/15 0425  NA 136 137 137 137 139  K 3.5 4.0 3.9 4.0 3.9  CL 110 108 108 107 106  CO2 GLUCOSE 113* 100* 93 98 98  BUN CREATININE 0.68 0.73 0.75 0.77 0.77  CALCIUM 7.8* 8.2* 8.3* 8.4* 8.6*    Liver Function Tests:  Recent Labs Lab 09/11/15 0419 09/12/15 0450  AST 30 27  ALT 32 29  ALKPHOS 60 56  BILITOT 1.0 0.9  PROT 5.4* 5.2*  ALBUMIN 2.7* 2.6*   No results for input(s): LIPASE, AMYLASE in the last 168 hours. No results for input(s): AMMONIA in the last 168 hours.  CBC:  Recent Labs Lab 09/13/15 0340 09/14/15 0350 09/15/15 0336 09/16/15 0415 09/17/15 0425  WBC 6.2 5.5 4.5 4.7 4.1  HGB 8.0* 7.8* 7.9* 8.6* 8.3*  HCT 25.8* 26.1* 26.3* 28.6* 28.3*  MCV 86.6 86.4 86.8 86.7 87.1  PLT 213 225 201 200 190    Cardiac Enzymes: No results for input(s): CKTOTAL, CKMB, CKMBINDEX, TROPONINI in the last 168  hours.  BNP: BNP (last 3 results)  Recent Labs  05/13/15 2248 08/21/15 1345 08/31/15 0400  BNP 1235.6* 2132.9* 1387.0*    ProBNP (last 3 results) No results for input(s): PROBNP in the last 8760 hours.    Other results:  Imaging: Dg Chest Port 1 View  09/16/2015  CLINICAL DATA:  LVAD.  Shortness of breath. EXAM: PORTABLE CHEST 1 VIEW COMPARISON:  09/15/2015 FINDINGS: LVAD and left AICD remain in place, unchanged. Right PICC line is unchanged. Cardiomegaly. Left lower lobe atelectasis and suspected small left effusion. No pneumothorax. IMPRESSION: Stable left base atelectasis.  Suspect small left effusion. Stable cardiomegaly. Electronically Signed   By: Charlett Nose M.D.   On: 09/16/2015 09:23     Medications:     Scheduled Medications: . amiodarone  200 mg Oral BID  . aspirin EC  81 mg Oral Daily  . bisacodyl  10 mg Oral Daily   Or  . bisacodyl  10 mg Rectal Daily  . citalopram  10 mg Oral Daily  . feeding supplement  1 Container  Oral TID BM  . ferrous fumarate-b12-vitamic C-folic acid  1 capsule Oral TID PC  . mirtazapine  15 mg Oral QHS  . OLANZapine  5 mg Oral QHS  . pantoprazole  40 mg Oral Daily  . sodium chloride flush  10-40 mL Intracatheter Q12H  . sodium chloride flush  3 mL Intravenous Q12H  . tamsulosin  0.4 mg Oral Daily  . Warfarin - Pharmacist Dosing Inpatient   Does not apply q1800    Infusions: . sodium chloride Stopped (08/31/15 1700)  . sodium chloride 250 mL (08/31/15 0540)  . sodium chloride 10 mL/hr (09/08/15 1609)  . sodium chloride 10 mL/hr at 09/14/15 2000  . lactated ringers Stopped (08/30/15 1500)  . lactated ringers Stopped (09/02/15 1200)    PRN Medications: sodium chloride, antiseptic oral rinse, docusate sodium, fentaNYL (SUBLIMAZE) injection, ondansetron (ZOFRAN) IV, polyvinyl alcohol, sodium chloride flush, sodium chloride flush, traMADol   Assessment:   1. 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 EF 15%. Cath 8/14 with normal cors. 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  2. Frequent PVCs 3. Severe Malnutrition- Prealbumin 14.7 on 6/20 4. Acute post-op delirium/sundowning 5. Urinary Retention requiring Foley 6. Left pleural effusion s/p chest tube   Plan/Discussion:    Mental status continues to improve. Continue Zyprexa and Celexa.  CO-OX pending. Volume status stable.  MAP 80s. Add 12.5 mg spiro daily. Renal function stable.    INR 1.81.  Pharmacy dosing coumadin.    Leave Foley in for urinary retention. Continue Flomax.   VAD parameters reviewed. Isolated power spike noted (10.4). Event log was reviewed thought to be pump burn in. INR and LDH stable.    Continue to work with PT and ambulate. Encourage oral intake. PT recommending CIR.   Hopefully can go to CIR later this week.    Length of Stay: 54  Amy Clegg, NP-C 10:13 AM   Patient seen with NP, agree with the above note.  Ramp echo done yesterday, increased speed to 9400 rpm.   Felt better with walk today.  Weight trending up.  Adding spironolactone today, may need Lasix tomorrow will reassess.   Isolated power spike, may be "burning in" phenomenon, has not been repeated.   CIR consult, may be ready later this week.   Marca Ancona 09/17/2015

## 2015-09-18 ENCOUNTER — Inpatient Hospital Stay (HOSPITAL_COMMUNITY): Payer: Medicare Other

## 2015-09-18 LAB — LACTATE DEHYDROGENASE: LDH: 216 U/L — ABNORMAL HIGH (ref 98–192)

## 2015-09-18 LAB — BASIC METABOLIC PANEL
Anion gap: 4 — ABNORMAL LOW (ref 5–15)
BUN: 10 mg/dL (ref 6–20)
CO2: 26 mmol/L (ref 22–32)
Calcium: 8.2 mg/dL — ABNORMAL LOW (ref 8.9–10.3)
Chloride: 109 mmol/L (ref 101–111)
Creatinine, Ser: 0.73 mg/dL (ref 0.61–1.24)
GFR calc Af Amer: >60 mL/min (ref >60)
GFR calc non Af Amer: >60 mL/min (ref >60)
Glucose, Bld: 135 mg/dL — ABNORMAL HIGH (ref 65–99)
Potassium: 3.5 mmol/L (ref 3.5–5.1)
Sodium: 139 mmol/L (ref 135–145)

## 2015-09-18 LAB — CBC
HCT: 28.3 % — ABNORMAL LOW (ref 39.0–52.0)
Hemoglobin: 8.4 g/dL — ABNORMAL LOW (ref 13.0–17.0)
MCH: 25.7 pg — ABNORMAL LOW (ref 26.0–34.0)
MCHC: 29.7 g/dL — ABNORMAL LOW (ref 30.0–36.0)
MCV: 86.5 fL (ref 78.0–100.0)
Platelets: 178 10*3/uL (ref 150–400)
RBC: 3.27 MIL/uL — ABNORMAL LOW (ref 4.22–5.81)
RDW: 17 % — ABNORMAL HIGH (ref 11.5–15.5)
WBC: 4.4 10*3/uL (ref 4.0–10.5)

## 2015-09-18 LAB — GLUCOSE, CAPILLARY
GLUCOSE-CAPILLARY: 166 mg/dL — AB (ref 65–99)
GLUCOSE-CAPILLARY: 120 mg/dL — AB (ref 65–99)
Glucose-Capillary: 99 mg/dL (ref 65–99)
Glucose-Capillary: 183 mg/dL — ABNORMAL HIGH (ref 65–99)

## 2015-09-18 LAB — PROTIME-INR
INR: 1.95 — ABNORMAL HIGH (ref 0.00–1.49)
Prothrombin Time: 22.1 seconds — ABNORMAL HIGH (ref 11.6–15.2)

## 2015-09-18 LAB — CARBOXYHEMOGLOBIN
CARBOXYHEMOGLOBIN: 1.8 % — AB (ref 0.5–1.5)
Methemoglobin: 0.7 % (ref 0.0–1.5)
O2 SAT: 83.4 %
Total hemoglobin: 8 g/dL — ABNORMAL LOW (ref 13.5–18.0)

## 2015-09-18 MED ORDER — POTASSIUM CHLORIDE CRYS ER 20 MEQ PO TBCR
40.0000 meq | EXTENDED_RELEASE_TABLET | Freq: Once | ORAL | Status: AC
  Administered 2015-09-18: 40 meq via ORAL
  Filled 2015-09-18: qty 2

## 2015-09-18 MED ORDER — WARFARIN SODIUM 5 MG PO TABS
5.0000 mg | ORAL_TABLET | Freq: Once | ORAL | Status: AC
  Administered 2015-09-18: 5 mg via ORAL
  Filled 2015-09-18: qty 1

## 2015-09-18 MED ORDER — ACETAMINOPHEN 325 MG PO TABS
650.0000 mg | ORAL_TABLET | ORAL | Status: DC | PRN
  Administered 2015-09-18 – 2015-09-19 (×2): 650 mg via ORAL
  Filled 2015-09-18 (×2): qty 2

## 2015-09-18 MED ORDER — FUROSEMIDE 40 MG PO TABS
40.0000 mg | ORAL_TABLET | Freq: Once | ORAL | Status: AC
  Administered 2015-09-18: 40 mg via ORAL
  Filled 2015-09-18: qty 1

## 2015-09-18 MED ORDER — SPIRONOLACTONE 25 MG PO TABS
25.0000 mg | ORAL_TABLET | Freq: Every day | ORAL | Status: DC
  Administered 2015-09-18 – 2015-09-19 (×2): 25 mg via ORAL
  Filled 2015-09-18: qty 1

## 2015-09-18 NOTE — Progress Notes (Signed)
Occupational Therapy Treatment Patient Details Name: [name] MRN: [id number] DOB: 03-18-1940 Today's Date: 09/18/2015    History of present illness Pt is a 75 y/o M who was [facility] on 08[facility]21/[facility] with acute on chronic systolic heart failure.Pt had LVAD placed 6/23. Post op course complicated by delirium and confusion. Pt's PMH inlcudes frequent PVCs, nonischemic cardiomyopathy, automatic ICD.    OT comments  Family complaining of L hand weakness and decreased use of his L hand. L hand strength similar to R hand strength. Pt states he is "afraid to use his L hand because he doesn't want to pull the stuff out of his chest"; that if he "does something to the lines that he will be gone". Pt unable to identify lines or functions of lines this pm. Pt was unable to identify that he was connected to the power module and not to battery system. Pt also thought it was June and asked if I knew anything about the delivery from the doctor. Required Max vc to problem solved how to scoot forward/backward in chair. Wife present for session and asking why pt had not learned how to switch out his power source. Educated wife on importance of pt being clear cognitively in order to safely learn how to transfer power sources. Pt then discussed how Dr. Milas Kocher is out ot town for 2 weeks, then wife turned and said " see, there is nothing wrong with his memory". Wife expressed her concern of "learning the switch over" also. Will continue to follow acutely. At this time, if pt is denied by CIR, he will benefit from rehab at SNF to facilitate safe D/C home.  Follow Up Recommendations  CIR;Supervision/Assistance - 24 hour    Equipment Recommendations  None recommended by OT    Recommendations for Other Services Rehab consult    Precautions / Restrictions Precautions Precautions: Sternal;Other (comment) Precaution Comments: LVAD        Mobility Bed Mobility Overal bed mobility:  Needs Assistance;+2 for physical assistance Bed Mobility: Sit to Supine     Supine to sit: Mod assist;HOB elevated        Transfers Overall transfer level: Needs assistance Equipment used: 2 person hand held assist   Sit to Stand: Min assist              Balance     Sitting balance-Leahy Scale: Fair       Standing balance-Leahy Scale: Poor  LOB posteriorly initially                   ADL Overall ADL's : Needs assistance/impaired                                     Functional mobility during ADLs: Minimal assistance;+2 for physical assistance;Rolling walker General ADL Comments: Max verbal and tactile cues to get pt to scoot hips forward/backward in chair. Pt not wanting to use his L hand becuase  he "doesn't want to pull anything loose". Pt appeared confused as to the power source he was connected to. wife also though pt was on battery source when he was connected to wall power. Educated wife that pt needed to be hooked up to his main power source if there was a chance he could fall asleep. Pt then began discussing how someone was "delivering" something and also thought it was "June". wife then asked why pt  hadn't learned how to switch power sources himself.       Vision                     Perception     Praxis      Cognition   Behavior During Therapy: [facility] for tasks assessed/performed Overall Cognitive Status: Impaired/Different from baseline Area of Impairment: Orientation;Attention;Memory;Following commands;Problem solving;Awareness;Safety/judgement Orientation Level: Disoriented to;Time Current Attention Level: Sustained Memory: Decreased recall of precautions;Decreased short-term memory  Following Commands: Follows one step commands consistently Safety/Judgement: Decreased awareness of safety;Decreased awareness of deficits Awareness: Emergent Problem Solving: Slow processing;Difficulty sequencing      Extremity/Trunk  Assessment   BUE generalized weakness.             Exercises     Shoulder Instructions       General Comments      Pertinent Vitals/ Pain       Pain Assessment: Faces Faces Pain Scale: No hurt  Home Living                                          Prior Functioning/Environment              Frequency Min 3X/week     Progress Toward Goals  OT Goals(current goals can now be found in the care plan section)  Progress towards OT goals: Progressing toward goals (continue with current goals)  Acute Rehab OT Goals Patient Stated Goal: to be able to go to the beach again OT Goal Formulation: With patient/family Time For Goal Achievement: 10/02/15 Potential to Achieve Goals: Good ADL Goals Pt Will Perform Grooming: with set-up;with supervision;standing Pt Will Perform Upper Body Bathing: with set-up;with supervision;sitting Pt Will Perform Lower Body Bathing: sit to/from stand;with min assist Pt Will Perform Lower Body Dressing: with min assist;sit to/from stand Pt Will Transfer to Toilet: with min guard assist;bedside commode;stand pivot transfer Additional ADL Goal #1: Pt will demonstrte ability to manage LVAD equipmetn during ADL task with min vc  Plan Discharge plan remains appropriate    Co-evaluation                 End of Session     Activity Tolerance Patient tolerated treatment well   Patient Left in bed;with call bell/phone within reach;with nursing/sitter in room;with family/visitor present   Nurse Communication Mobility status        Time: 1610-9604 OT Time Calculation (min): 20 min  Charges: OT General Charges $OT Visit: 1 Procedure OT Treatments $Self Care/Home Management : 8-22 mins  Zayli Villafuerte,HILLARY 09/18/2015, 4:14 PM   [facility], OTR/L  [phone] 09/18/2015

## 2015-09-18 NOTE — Progress Notes (Signed)
Patient ID: [name], male   DOB: 04/19/1940, 75 y.o.   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 Normal cors. EF 10% with low cardiac output.   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    Foley placed 7/3 for urinary retention. S/P CT with 1700 cc fluid out.   Ramp echo 7/10 with increase in speed to 9400 rpm.   Mental status continues to improved. Slept last night. On Zyprexa and Celexa. Continues to ambulate with PT.   Stable off milrinone. Denies SOB/pain. Ambulated 400 feet with PT.    LDH 237>210 >216 MAPs 80-90s   Flow: 5.4   L/min  Speed: 9400 PI 5.2  Power 6.  Occasional PI events. On 7/9 had Power Spike 10.4.               Objective:   Vital Signs: MAPs 70s   Temp:  [98.3 F (36.8 C)-98.8 F (37.1 C)] 98.3 F (36.8 C) (07/12 0451) Pulse Rate:  [63-68] 67 (07/12 0451) Resp:  [18] 18 (07/12 0451) BP: (95-96)/(84) 96/84 mmHg (07/12 0500) SpO2:  [97 %-100 %] 100 % (07/12 0451) Weight:  [174 lb 13.2 oz (79.3 kg)] 174 lb 13.2 oz (79.3 kg) (07/12 0451) Last BM Date: 09/11/15  Weight change: Filed Weights   09/16/15 0600 09/17/15 0445 09/18/15 0451  Weight: 170 lb 13.7 oz (77.5 kg) 174 lb 9.7 oz (79.2 kg) 174 lb 13.2 oz (79.3 kg)    Intake/Output:   Intake/Output Summary (Last 24 hours) at 09/18/15 1031 Last data filed at 09/18/15 0745  Gross per 24 hour  Intake    720 ml  Output    750 ml  Net    -30 ml    MAPS  80-90s   Physical Exam: General: In the chair.  NAD.  HEENT: normal  Neck: supple. JVP ~10  Cor:  LVAD hum.  Lungs: LLL decreased Abdomen: soft, nondistended. No hepatosplenomegaly. Bowel sound present. Driveline site ok. Securement device in place.  Extremities: no cyanosis, clubbing, rash, R and LLE 1 + edema. RUE PICC Neuro:  Alert and oriented to person and place GU: Foley.   Telemetry:  Sinus with PVCs  Labs: Basic Metabolic  Panel:  Recent Labs Lab 09/13/15 0815 09/14/15 0350 09/15/15 0336 09/16/15 0415 09/17/15 0425  NA 136 137 137 137 139  K 3.5 4.0 3.9 4.0 3.9  CL 110 108 108 107 106  CO2 23 25 26 26 27   GLUCOSE 113* 100* 93 98 98  BUN 8 9 9 9 11   CREATININE 0.68 0.73 0.75 0.77 0.77  CALCIUM 7.8* 8.2* 8.3* 8.4* 8.6*    Liver Function Tests:  Recent Labs Lab 09/12/15 0450  AST 27  ALT 29  ALKPHOS 56  BILITOT 0.9  PROT 5.2*  ALBUMIN 2.6*   No results for input(s): LIPASE, AMYLASE in the last 168 hours. No results for input(s): AMMONIA in the last 168 hours.  CBC:  Recent Labs Lab 09/13/15 0340 09/14/15 0350 09/15/15 0336 09/16/15 0415 09/17/15 0425  WBC 6.2 5.5 4.5 4.7 4.1  HGB 8.0* 7.8* 7.9* 8.6* 8.3*  HCT 25.8* 26.1* 26.3* 28.6* 28.3*  MCV 86.6 86.4 86.8 86.7 87.1  PLT 213 225 201 200 190    Cardiac Enzymes: No results for input(s): CKTOTAL, CKMB, CKMBINDEX, TROPONINI in the last 168 hours.  BNP: BNP (last 3 results)  Recent Labs  05/13/15 2248 08/21/15 1345 08/31/15 0400  BNP 1235.6* 2132.9* 1387.0*    ProBNP (last 3 results) No results for input(s): PROBNP in the last 8760 hours.    Other results:  Imaging: Dg Chest 2 View  09/18/2015  CLINICAL DATA:  Follow-up positioning of left ventricular assist device placed on June 23rd; no current chest complaints. EXAM: CHEST  2 VIEW COMPARISON:  Portable chest x-ray of September 16, 2015 FINDINGS: The left ventricular assist device appears to be in stable position positioned over the cardiac apex. The cardiac silhouette remains mildly enlarged. The pulmonary vascularity is normal. There is noalveolar infiltrate. Small pleural effusions layer posteriorly, bilaterally. The permanent pacemaker defibrillator is in stable position. The sternal wires are intact. The PICC line tip projects over the midportion of the SVC. There is calcification in the wall of the aortic arch. The observed bony structures exhibit no acute  abnormalities. IMPRESSION: Stable appearance of the left ventricular assist device. Stable mild cardiomegaly without pulmonary vascular congestion or pulmonary edema. Bilateral small posterior pleural effusions. Aortic atherosclerosis. Electronically Signed   By: David  [place] M.D.   On: 09/18/2015 09:01     Medications:     Scheduled Medications: . amiodarone  200 mg Oral BID  . aspirin EC  81 mg Oral Daily  . bisacodyl  10 mg Oral Daily   Or  . bisacodyl  10 mg Rectal Daily  . citalopram  10 mg Oral Daily  . feeding supplement  1 Container Oral TID BM  . ferrous fumarate-b12-vitamic C-folic acid  1 capsule Oral TID PC  . mirtazapine  15 mg Oral QHS  . OLANZapine  5 mg Oral QHS  . pantoprazole  40 mg Oral Daily  . sodium chloride flush  10-40 mL Intracatheter Q12H  . sodium chloride flush  3 mL Intravenous Q12H  . spironolactone  12.5 mg Oral Daily  . tamsulosin  0.4 mg Oral Daily  . Warfarin - Pharmacist Dosing Inpatient   Does not apply q1800    Infusions: . sodium chloride Stopped (08/31/15 1700)  . sodium chloride 250 mL (08/31/15 0540)  . sodium chloride 10 mL/hr (09/08/15 1609)  . sodium chloride 10 mL/hr at 09/14/15 2000  . lactated ringers Stopped (08/30/15 1500)  . lactated ringers Stopped (09/02/15 1200)    PRN Medications: sodium chloride, acetaminophen, antiseptic oral rinse, docusate sodium, fentaNYL (SUBLIMAZE) injection, ondansetron (ZOFRAN) IV, polyvinyl alcohol, sodium chloride flush, sodium chloride flush, traMADol   Assessment:   1. 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 EF 15%. Cath 8/14 with normal cors. 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  2. Frequent PVCs 3. Severe Malnutrition- Prealbumin 14.7 on 6/20 4. Acute post-op delirium/sundowning 5. Urinary Retention requiring Foley 6. Left pleural effusion s/p chest tube   Plan/Discussion:    Mental status continues to improve. Continue Zyprexa and Celexa.  CO-OX 83%.  Volume status mildly elevated.  MAP 80-90s. Increase spiro 25 mg daily and add 40 mg  po lasix.   INR 1.95.  Pharmacy dosing coumadin.    Remove foley. Start voiding trial. Scan bladder after he voids. If less 100 leave foley out. Continue Flomax.   VAD parameters reviewed. Isolated power spike noted (10.4). Event log was reviewed thought to be pump burn in. INR and LDH stable.    Continue to work with PT/OT. CIR MD evaluated. Recommending HH.   Home once family can manage at home.     Length of  Stay: 50  Amy Clegg, NP-C 10:31 AM   Patient seen with NP, agree with the above note.  He continues to gradually improve.  Evaluated by CIR MD, recommend home with home health at this point.  We are starting Lasix today with some volume overload.    Continue to work with rehab and PT, possibly home with home health/PT end of week.   Marca Ancona 09/18/2015

## 2015-09-18 NOTE — Progress Notes (Signed)
Physical Therapy Treatment Patient Details Name: [name] MRN: [id number] DOB: 1940-11-15 Today's Date: 09/18/2015    History of Present Illness Pt is a 75 y/o M who was admitted on 08/21/2015 from [facility] with acute on chronic systolic heart failure.Pt had LVAD placed 6/23. Post op course complicated by delirium and confusion. Pt's PMH inlcudes frequent PVCs, nonischemic cardiomyopathy, automatic ICD.     PT Comments    Pt continues to make steady progress. CIR denied. Do not think wife alone can assist pt up out of bed and into standing. Unsure if family has the availability to assist her for this. May need ST-SNF if wife will be alone when caring for pt.  Follow Up Recommendations  SNF (Unless family can assist wife with his care. CIR denied.)     Equipment Recommendations  None recommended by PT    Recommendations for Other Services       Precautions / Restrictions Precautions Precautions: Sternal;Other (comment) Precaution Comments: LVAD     Mobility  Bed Mobility Overal bed mobility: Needs Assistance Bed Mobility: Supine to Sit     Supine to sit: Mod assist     General bed mobility comments: Assist to elevate trunk into sitting  Transfers Overall transfer level: Needs assistance Equipment used: 4-wheeled walker Transfers: Sit to/from Stand Sit to Stand: Min assist;Mod assist         General transfer comment: Assist to bring hips up and verbal cues to have pt place hands on knees. More assist from low chair than elevated bed  Ambulation/Gait Ambulation/Gait assistance: Min guard Ambulation Distance (Feet): 550 Feet Assistive device: 4-wheeled walker Gait Pattern/deviations: Step-through pattern;Decreased stride length;Trunk flexed Gait velocity: Too fast for his abilities at times.   General Gait Details: Pt handling rollator without difficulty. Verbal cues to stand more erect and look up. Occasional cues to slow  pace.   Stairs            Wheelchair Mobility    Modified Rankin (Stroke Patients Only)       Balance Overall balance assessment: Needs assistance Sitting-balance support: No upper extremity supported Sitting balance-Leahy Scale: Fair     Standing balance support: Bilateral upper extremity supported Standing balance-Leahy Scale: Poor Standing balance comment: UE support                    Cognition Arousal/Alertness: Awake/alert Behavior During Therapy: WFL for tasks assessed/performed Overall Cognitive Status: Impaired/Different from baseline Area of Impairment: Orientation;Attention;Memory;Following commands;Safety/judgement;Problem solving Orientation Level: Disoriented to;Time Current Attention Level: Sustained Memory: Decreased recall of precautions;Decreased short-term memory Following Commands: Follows one step commands consistently Safety/Judgement: Decreased awareness of safety;Decreased awareness of deficits Awareness: Intellectual Problem Solving: Slow processing;Decreased initiation;Difficulty sequencing;Requires verbal cues;Requires tactile cues      Exercises General Exercises - Upper Extremity Shoulder Flexion: AROM;10 reps;Both;Seated General Exercises - Lower Extremity Ankle Circles/Pumps: AROM;10 reps;Seated;Both Quad Sets: Strengthening;Both;5 reps;Seated Long Arc Quad: Strengthening;Both;10 reps;Seated    General Comments        Pertinent Vitals/Pain      Home Living                      Prior Function            PT Goals (current goals can now be found in the care plan section) Progress towards PT goals: Progressing toward goals    Frequency  Min 3X/week    PT Plan Discharge plan needs to be updated  Co-evaluation             End of Session Equipment Utilized During Treatment: Gait belt Activity Tolerance: Patient tolerated treatment well Patient left: with call bell/phone within reach;with  family/visitor present;in chair;with chair alarm set     Time: 1001-1037 PT Time Calculation (min) (ACUTE ONLY): 36 min  Charges:  $Gait Training: 23-37 mins                    G Codes:      Miyonna Ormiston Oct 02, 2015, 11:34 AM [name] PT [phone]

## 2015-09-18 NOTE — Progress Notes (Signed)
Pt driveline dressing changed using sterile technique. Daughter at bedside during dressing change and educated. Scant drainage noted at site. Pt tolerated well. RN will continue to monitor.  Roselie Awkward, RN

## 2015-09-18 NOTE — Progress Notes (Signed)
CARDIAC REHAB PHASE I   PRE:  Rate/Rhythm: 64     BP: sitting 70    SaO2: 9 RA  MODE:  Ambulation: 550 ft   POST:  Rate/Rhythm: 80    BP: sitting 96     SaO2: 98 RA  Pt on batteries in room. Controller alarmed when pt sat up, PI 2.2. Pt asymptomatic. Pt struggles to get hips to EOB. Attempted to stand with gait belt, assist x1 but unsuccessful. Had pt move hips further forward. Eventually leaned back to move hips forward. Able to stand with rocking and gait belt assist. Rest x1 while walking with rollator due to SOB. Tired toward end of walk. To recliner. Had pt transfer to power cord, needed max verbal cues. Will f/u. Pt left hand has been weak and he is not using it much. Encouraged him to work with it in the room. [id number]  Harriet Masson CES, ACSM 09/18/2015 2:15 PM

## 2015-09-18 NOTE — Progress Notes (Signed)
ANTICOAGULATION CONSULT NOTE - Follow Up Consult  Pharmacy Consult for Coumadin Indication: LVAD   Labs:  Recent Labs  09/16/15 0415 09/17/15 0425 09/18/15 0450  HGB 8.6* 8.3*  --   HCT 28.6* 28.3*  --   PLT 200 190  --   LABPROT 22.0* 21.0* 22.1*  INR 1.94* 1.81* 1.95*  CREATININE 0.77 0.77  --     Estimated Creatinine Clearance: 92.2 mL/min (by C-G formula based on Cr of 0.77).  Assessment: 74yom s/p LVAD 6/23 continues on coumadin (started low dose per MD on 6/24, pharmacy dosing starting 7/3). INR trended up to 3.38 and doses held 7/4 and 7/5.   INR falling after doses held last week, 1.9 this am. Hgb low but stable yesterday with no cbc this am. Continues on po amiodarone.  LDH stable 216  Goal of Therapy:  INR 2-2.5 Monitor platelets by anticoagulation protocol: Yes   Plan:  1) Coumadin 5mg  again tonight  2) Daily INR  Sheppard Coil PharmD., BCPS Clinical Pharmacist Pager [phone] 09/18/2015 9:45 AM

## 2015-09-19 ENCOUNTER — Inpatient Hospital Stay (HOSPITAL_COMMUNITY)
Admission: RE | Admit: 2015-09-19 | Discharge: 2015-10-02 | DRG: 948 | Disposition: A | Discharge status: Home Health | Payer: Medicare Other | Source: Intra-hospital | Attending: Physical Medicine & Rehabilitation | Admitting: Physical Medicine & Rehabilitation

## 2015-09-19 DIAGNOSIS — I429 Cardiomyopathy, unspecified: Secondary | ICD-10-CM | POA: Diagnosis present

## 2015-09-19 DIAGNOSIS — I509 Heart failure, unspecified: Secondary | ICD-10-CM | POA: Insufficient documentation

## 2015-09-19 DIAGNOSIS — Z95811 Presence of heart assist device: Secondary | ICD-10-CM | POA: Diagnosis not present

## 2015-09-19 DIAGNOSIS — R338 Other retention of urine: Secondary | ICD-10-CM | POA: Diagnosis present

## 2015-09-19 DIAGNOSIS — Z961 Presence of intraocular lens: Secondary | ICD-10-CM | POA: Diagnosis present

## 2015-09-19 DIAGNOSIS — Z9842 Cataract extraction status, left eye: Secondary | ICD-10-CM

## 2015-09-19 DIAGNOSIS — I110 Hypertensive heart disease with heart failure: Secondary | ICD-10-CM | POA: Diagnosis present

## 2015-09-19 DIAGNOSIS — G4700 Insomnia, unspecified: Secondary | ICD-10-CM | POA: Diagnosis present

## 2015-09-19 DIAGNOSIS — F329 Major depressive disorder, single episode, unspecified: Secondary | ICD-10-CM | POA: Diagnosis present

## 2015-09-19 DIAGNOSIS — G931 Anoxic brain damage, not elsewhere classified: Secondary | ICD-10-CM | POA: Diagnosis present

## 2015-09-19 DIAGNOSIS — Z981 Arthrodesis status: Secondary | ICD-10-CM

## 2015-09-19 DIAGNOSIS — F0150 Vascular dementia without behavioral disturbance: Secondary | ICD-10-CM | POA: Diagnosis present

## 2015-09-19 DIAGNOSIS — F0390 Unspecified dementia without behavioral disturbance: Secondary | ICD-10-CM | POA: Diagnosis not present

## 2015-09-19 DIAGNOSIS — Z8249 Family history of ischemic heart disease and other diseases of the circulatory system: Secondary | ICD-10-CM | POA: Diagnosis not present

## 2015-09-19 DIAGNOSIS — J90 Pleural effusion, not elsewhere classified: Secondary | ICD-10-CM

## 2015-09-19 DIAGNOSIS — Z7901 Long term (current) use of anticoagulants: Secondary | ICD-10-CM

## 2015-09-19 DIAGNOSIS — N401 Enlarged prostate with lower urinary tract symptoms: Secondary | ICD-10-CM | POA: Diagnosis present

## 2015-09-19 DIAGNOSIS — I5022 Chronic systolic (congestive) heart failure: Secondary | ICD-10-CM | POA: Diagnosis present

## 2015-09-19 DIAGNOSIS — F17220 Nicotine dependence, chewing tobacco, uncomplicated: Secondary | ICD-10-CM | POA: Diagnosis present

## 2015-09-19 DIAGNOSIS — G9340 Encephalopathy, unspecified: Secondary | ICD-10-CM | POA: Insufficient documentation

## 2015-09-19 DIAGNOSIS — I4891 Unspecified atrial fibrillation: Secondary | ICD-10-CM | POA: Diagnosis present

## 2015-09-19 DIAGNOSIS — Z9841 Cataract extraction status, right eye: Secondary | ICD-10-CM | POA: Diagnosis not present

## 2015-09-19 DIAGNOSIS — E869 Volume depletion, unspecified: Secondary | ICD-10-CM | POA: Diagnosis not present

## 2015-09-19 DIAGNOSIS — I493 Ventricular premature depolarization: Secondary | ICD-10-CM | POA: Diagnosis present

## 2015-09-19 DIAGNOSIS — Z888 Allergy status to other drugs, medicaments and biological substances status: Secondary | ICD-10-CM

## 2015-09-19 DIAGNOSIS — R5381 Other malaise: Secondary | ICD-10-CM | POA: Diagnosis present

## 2015-09-19 DIAGNOSIS — K5901 Slow transit constipation: Secondary | ICD-10-CM | POA: Diagnosis not present

## 2015-09-19 DIAGNOSIS — F05 Delirium due to known physiological condition: Secondary | ICD-10-CM | POA: Diagnosis present

## 2015-09-19 DIAGNOSIS — Z885 Allergy status to narcotic agent status: Secondary | ICD-10-CM

## 2015-09-19 DIAGNOSIS — I428 Other cardiomyopathies: Secondary | ICD-10-CM | POA: Diagnosis present

## 2015-09-19 DIAGNOSIS — D62 Acute posthemorrhagic anemia: Secondary | ICD-10-CM | POA: Diagnosis present

## 2015-09-19 DIAGNOSIS — K5900 Constipation, unspecified: Secondary | ICD-10-CM | POA: Diagnosis present

## 2015-09-19 DIAGNOSIS — Z79899 Other long term (current) drug therapy: Secondary | ICD-10-CM

## 2015-09-19 DIAGNOSIS — K219 Gastro-esophageal reflux disease without esophagitis: Secondary | ICD-10-CM | POA: Diagnosis present

## 2015-09-19 DIAGNOSIS — R531 Weakness: Secondary | ICD-10-CM | POA: Diagnosis present

## 2015-09-19 DIAGNOSIS — I5023 Acute on chronic systolic (congestive) heart failure: Secondary | ICD-10-CM | POA: Diagnosis not present

## 2015-09-19 LAB — BASIC METABOLIC PANEL
Anion gap: 5 (ref 5–15)
BUN: 9 mg/dL (ref 6–20)
CO2: 27 mmol/L (ref 22–32)
Calcium: 8.4 mg/dL — ABNORMAL LOW (ref 8.9–10.3)
Chloride: 107 mmol/L (ref 101–111)
Creatinine, Ser: 0.79 mg/dL (ref 0.61–1.24)
GFR calc Af Amer: >60 mL/min (ref >60)
GFR calc non Af Amer: >60 mL/min (ref >60)
Glucose, Bld: 85 mg/dL (ref 65–99)
Potassium: 4.1 mmol/L (ref 3.5–5.1)
Sodium: 139 mmol/L (ref 135–145)

## 2015-09-19 LAB — CBC
HCT: 28.5 % — ABNORMAL LOW (ref 39.0–52.0)
Hemoglobin: 8.5 g/dL — ABNORMAL LOW (ref 13.0–17.0)
MCH: 25.7 pg — ABNORMAL LOW (ref 26.0–34.0)
MCHC: 29.8 g/dL — ABNORMAL LOW (ref 30.0–36.0)
MCV: 86.1 fL (ref 78.0–100.0)
Platelets: 176 10*3/uL (ref 150–400)
RBC: 3.31 MIL/uL — ABNORMAL LOW (ref 4.22–5.81)
RDW: 17.1 % — ABNORMAL HIGH (ref 11.5–15.5)
WBC: 4.1 10*3/uL (ref 4.0–10.5)

## 2015-09-19 LAB — CARBOXYHEMOGLOBIN
Carboxyhemoglobin: 2.1 % — ABNORMAL HIGH (ref 0.5–1.5)
Methemoglobin: 0.8 % (ref 0.0–1.5)
O2 SAT: 71.8 %
Total hemoglobin: 9 g/dL — ABNORMAL LOW (ref 13.5–18.0)

## 2015-09-19 LAB — PROTIME-INR
INR: 2.12 — AB (ref 0.00–1.49)
PROTHROMBIN TIME: 23.5 s — AB (ref 11.6–15.2)

## 2015-09-19 LAB — LACTATE DEHYDROGENASE: LDH: 196 U/L — AB (ref 98–192)

## 2015-09-19 LAB — GLUCOSE, CAPILLARY
Glucose-Capillary: 106 mg/dL — ABNORMAL HIGH (ref 65–99)
Glucose-Capillary: 133 mg/dL — ABNORMAL HIGH (ref 65–99)

## 2015-09-19 MED ORDER — BOOST / RESOURCE BREEZE PO LIQD: 1.0000 | Freq: Three times a day (TID) | ORAL | Status: DC

## 2015-09-19 MED ORDER — BISACODYL 5 MG PO TBEC
10.0000 mg | DELAYED_RELEASE_TABLET | Freq: Every day | ORAL | Status: DC
  Administered 2015-09-20 – 2015-10-02 (×12): 10 mg via ORAL
  Filled 2015-09-19 (×12): qty 2

## 2015-09-19 MED ORDER — MIRTAZAPINE 15 MG PO TABS: 15.0000 mg | ORAL_TABLET | Freq: Every day | ORAL | Status: DC

## 2015-09-19 MED ORDER — ACETAMINOPHEN 325 MG PO TABS: 650.0000 mg | ORAL_TABLET | ORAL | Status: DC | PRN

## 2015-09-19 MED ORDER — WARFARIN SODIUM 4 MG PO TABS: 4.0000 mg | ORAL_TABLET | Freq: Once | ORAL | Status: DC

## 2015-09-19 MED ORDER — TRAMADOL HCL 50 MG PO TABS: 50.0000 mg | ORAL_TABLET | ORAL | Status: DC | PRN

## 2015-09-19 MED ORDER — BOOST / RESOURCE BREEZE PO LIQD
1.0000 | Freq: Three times a day (TID) | ORAL | Status: DC
  Administered 2015-09-19 – 2015-09-20 (×2): 1 via ORAL

## 2015-09-19 MED ORDER — CITALOPRAM HYDROBROMIDE 10 MG PO TABS: 10.0000 mg | ORAL_TABLET | Freq: Every day | ORAL | Status: DC

## 2015-09-19 MED ORDER — PANTOPRAZOLE SODIUM 40 MG PO TBEC
40.0000 mg | DELAYED_RELEASE_TABLET | Freq: Every day | ORAL | Status: DC
  Administered 2015-09-20 – 2015-10-02 (×13): 40 mg via ORAL
  Filled 2015-09-19 (×13): qty 1

## 2015-09-19 MED ORDER — TAMSULOSIN HCL 0.4 MG PO CAPS
0.4000 mg | ORAL_CAPSULE | Freq: Every day | ORAL | Status: DC
  Administered 2015-09-20 – 2015-10-02 (×13): 0.4 mg via ORAL
  Filled 2015-09-19 (×13): qty 1

## 2015-09-19 MED ORDER — DOCUSATE SODIUM 100 MG PO CAPS: 100.0000 mg | ORAL_CAPSULE | Freq: Two times a day (BID) | ORAL | Status: DC | PRN

## 2015-09-19 MED ORDER — AMIODARONE HCL 200 MG PO TABS
200.0000 mg | ORAL_TABLET | Freq: Two times a day (BID) | ORAL | Status: DC
  Administered 2015-09-19 – 2015-10-02 (×26): 200 mg via ORAL
  Filled 2015-09-19 (×26): qty 1

## 2015-09-19 MED ORDER — ASPIRIN EC 81 MG PO TBEC
81.0000 mg | DELAYED_RELEASE_TABLET | Freq: Every day | ORAL | Status: DC
  Administered 2015-09-20 – 2015-10-02 (×13): 81 mg via ORAL
  Filled 2015-09-19 (×13): qty 1

## 2015-09-19 MED ORDER — ACETAMINOPHEN 325 MG PO TABS
650.0000 mg | ORAL_TABLET | ORAL | Status: DC | PRN
  Filled 2015-09-19: qty 2

## 2015-09-19 MED ORDER — ONDANSETRON HCL 4 MG/2ML IJ SOLN: 4.0000 mg | Freq: Four times a day (QID) | INTRAMUSCULAR | Status: DC | PRN

## 2015-09-19 MED ORDER — FUROSEMIDE 40 MG PO TABS: 40.0000 mg | ORAL_TABLET | Freq: Every day | ORAL | Status: DC

## 2015-09-19 MED ORDER — SPIRONOLACTONE 25 MG PO TABS
25.0000 mg | ORAL_TABLET | Freq: Every day | ORAL | Status: DC
  Administered 2015-09-20 – 2015-09-22 (×3): 25 mg via ORAL
  Filled 2015-09-19 (×3): qty 1

## 2015-09-19 MED ORDER — FUROSEMIDE 40 MG PO TABS
40.0000 mg | ORAL_TABLET | Freq: Every day | ORAL | Status: DC
  Administered 2015-09-20: 40 mg via ORAL
  Filled 2015-09-19: qty 1

## 2015-09-19 MED ORDER — FE FUMARATE-B12-VIT C-FA-IFC PO CAPS: 1.0000 | ORAL_CAPSULE | Freq: Three times a day (TID) | ORAL | Status: DC

## 2015-09-19 MED ORDER — FUROSEMIDE 40 MG PO TABS
40.0000 mg | ORAL_TABLET | Freq: Every day | ORAL | Status: DC
  Administered 2015-09-19: 40 mg via ORAL
  Filled 2015-09-19: qty 1

## 2015-09-19 MED ORDER — BISACODYL 5 MG PO TBEC: 10.0000 mg | DELAYED_RELEASE_TABLET | Freq: Every day | ORAL | Status: DC

## 2015-09-19 MED ORDER — SORBITOL 70 % SOLN
30.0000 mL | Freq: Every day | Status: DC | PRN
  Administered 2015-09-26: 30 mL via ORAL
  Filled 2015-09-19: qty 30

## 2015-09-19 MED ORDER — MIRTAZAPINE 15 MG PO TABS
15.0000 mg | ORAL_TABLET | Freq: Every day | ORAL | Status: DC
  Administered 2015-09-19 – 2015-09-22 (×4): 15 mg via ORAL
  Filled 2015-09-19 (×4): qty 1

## 2015-09-19 MED ORDER — DOCUSATE SODIUM 100 MG PO CAPS
100.0000 mg | ORAL_CAPSULE | Freq: Two times a day (BID) | ORAL | Status: DC | PRN
  Administered 2015-09-20: 100 mg via ORAL
  Filled 2015-09-19: qty 1

## 2015-09-19 MED ORDER — ONDANSETRON HCL 4 MG PO TABS
4.0000 mg | ORAL_TABLET | Freq: Four times a day (QID) | ORAL | Status: DC | PRN
Start: 2015-09-19 — End: 2015-10-02

## 2015-09-19 MED ORDER — OLANZAPINE 5 MG PO TABS
5.0000 mg | ORAL_TABLET | Freq: Every day | ORAL | Status: DC
  Administered 2015-09-19 – 2015-09-24 (×5): 5 mg via ORAL
  Filled 2015-09-19 (×6): qty 1

## 2015-09-19 MED ORDER — BISACODYL 10 MG RE SUPP
10.0000 mg | Freq: Every day | RECTAL | Status: DC
  Administered 2015-09-30: 10 mg via RECTAL
  Filled 2015-09-19 (×3): qty 1

## 2015-09-19 MED ORDER — WARFARIN - PHARMACIST DOSING INPATIENT
Freq: Every day | Status: DC
  Administered 2015-09-25 – 2015-09-26 (×2)

## 2015-09-19 MED ORDER — POLYVINYL ALCOHOL 1.4 % OP SOLN: 1.0000 [drp] | Freq: Four times a day (QID) | OPHTHALMIC | Status: DC | PRN

## 2015-09-19 MED ORDER — ASPIRIN 81 MG PO TBEC: 81.0000 mg | DELAYED_RELEASE_TABLET | Freq: Every day | ORAL | Status: DC

## 2015-09-19 MED ORDER — FE FUMARATE-B12-VIT C-FA-IFC PO CAPS
1.0000 | ORAL_CAPSULE | Freq: Three times a day (TID) | ORAL | Status: DC
  Administered 2015-09-19 – 2015-10-02 (×36): 1 via ORAL
  Filled 2015-09-19 (×39): qty 1

## 2015-09-19 MED ORDER — POLYVINYL ALCOHOL 1.4 % OP SOLN
1.0000 [drp] | Freq: Four times a day (QID) | OPHTHALMIC | Status: DC | PRN
  Administered 2015-09-28: 1 [drp] via OPHTHALMIC
  Filled 2015-09-19: qty 15

## 2015-09-19 MED ORDER — CITALOPRAM HYDROBROMIDE 10 MG PO TABS
10.0000 mg | ORAL_TABLET | Freq: Every day | ORAL | Status: DC
  Administered 2015-09-20 – 2015-09-22 (×3): 10 mg via ORAL
  Filled 2015-09-19 (×3): qty 1

## 2015-09-19 MED ORDER — TAMSULOSIN HCL 0.4 MG PO CAPS: 0.4000 mg | ORAL_CAPSULE | Freq: Every day | ORAL | Status: DC

## 2015-09-19 MED ORDER — WARFARIN SODIUM 4 MG PO TABS
4.0000 mg | ORAL_TABLET | Freq: Once | ORAL | Status: DC
  Administered 2015-09-19: 4 mg via ORAL
  Filled 2015-09-19: qty 1

## 2015-09-19 MED ORDER — AMIODARONE HCL 200 MG PO TABS: 200.0000 mg | ORAL_TABLET | Freq: Two times a day (BID) | ORAL | Status: DC

## 2015-09-19 NOTE — Care Management Note (Signed)
Case Management Note Previous CM note initiated by Raynald Blend RN, CM  Patient Details  Name: [name] MRN: [id number] Date of Birth: Jan 24, 1941  Subjective/Objective:     Pt S/p LVAD               Action/Plan: From home with wife. Continues to struggle with post-op with confusion. Remains on milrinone 0.25 mcg. Weight up 6 pounds. Increase lasix to twice a daily. Continue metolazone. Renal function stable.   Ongoing sundowning/acute delirium. On zyprexa. Has sitter.   Continue amio 400 mg po twice a day.   VAD parameters stable. Off heparin. INR 2.5. On coumadin. On 81 mg aspirin.   PT following. CIR potential.  Pt tx to 2W from ICU-   Expected Discharge Date: 09/19/15              Expected Discharge Plan:  IP Rehab Facility  In-House Referral:  Clinical Social Work  Discharge planning Services  CM Consult  Post Acute Care Choice:    Choice offered to:     DME Arranged:    DME Agency:     HH Arranged:    HH Agency:     Status of Service:  Completed, signed off  If discussed at [employer], dates discussed:    Additional Comments:   09/19/15- 1200- Donn Pierini RN, BSN- have received word from [place] with  CIR that they will extend a bed offer for pt to admit to CIR today- have spoken with rounding team who are working on d/c LVAD education teaching in order for pt to go to CIR today - will plan for pt to d/c later today to CIR   09/18/15- Donn Pierini RN, BSN- per [employer] MD- pt not a candidate for CIR- will need to look at Home with HH vs SNF- pt remains somewhat confused and there is concern about pt going home with family and having 24/7 supervision and needed support by wife- wife however does not want SNF- have spoken with Melissa from CIR who feels like pt could benefit from [facility] program and will speak with rehab MD regarding pts situation. To f/u in AM - CSW continues to follow for possible SNF need- CM will follow for possible  [facility]   Per Raynald Blend RN, CM- S/P CT with 1700 cc fluid out.   CSW consulted for CIR back up plan.   Zenda Alpers [place], RN 09/19/2015, 2:28 PM [phone]

## 2015-09-19 NOTE — Progress Notes (Signed)
Edgar Karis Juba, MD Physician Signed Physical Medicine and Rehabilitation Consult Note 09/17/2015 10:45 AM  Related encounter: Admission (Current) from 08/21/2015 in [facility] 2W CARDIAC UNIT    Expand All Collapse All        Physical Medicine and Rehabilitation Consult Reason for Consult: Deconditioning related to acute on chronic systolic congestive heart failure Referring Physician: Dr. Zenaida Niece trigt   HPI: [name] is a 75 y.o. right handed male with history of cardiomyopathy and left bundle branch block. Per chart review patient is married and independent prior to admission. 2 level home with bedroom on main floor. 3 steps to entry. Family can assist as needed. Presented 08/21/2015 through the heart failure clinic with dizziness and low volume output. 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 Found to be in cardiogenic shock. Workup per cardiology services and cardiothoracic surgery patient underwent LVAD 08/30/2015 per Dr. Zenaida Niece trigt. He was extubated 08/31/2015. Received chest tube placement 09/12/2015 for left pleural effusion drained 1650 mL initially. Bouts of confusion with therapies initiated. Patient presently maintained on aspirin and Coumadin therapy. Acute on chronic anemia 8.3 and monitored. Physical therapy evaluations completed and ongoing with recommendations of physical medicine rehabilitation consult.   Review of Systems  Constitutional: Positive for malaise/fatigue. Negative for fever and chills.  HENT: Negative for hearing loss.  Eyes: Negative for blurred vision and double vision.  Respiratory: Positive for shortness of breath. Negative for cough.  Cardiovascular: Negative for chest pain.  Gastrointestinal: Positive for constipation. Negative for nausea.   GERD  Genitourinary: Positive for urgency. Negative for dysuria and hematuria.  Musculoskeletal: Positive for myalgias.  Neurological: Positive  for weakness. Negative for seizures and headaches.  Psychiatric/Behavioral: The patient has insomnia.  All other systems reviewed and are negative.  Past Medical History  Diagnosis Date  . Nephrolithiasis   . GERD (gastroesophageal reflux disease)   . Insomnia   . Nonischemic cardiomyopathy (HCC)     Cardiac catheterization 12/2011 with LVEF 45%, no significant obstructive CAD  . Automatic implantable cardioverter-defibrillator in situ   . H/O hiatal hernia   . Osteoarthritis   . Pneumonia     Dec 2016   Past Surgical History  Procedure Laterality Date  . Exploratory laparotomy  2006?  Marland Kitchen Appendectomy    . Volvulus reduction  2007  . Posterior fusion cervical spine  2001  . Bi-ventricular implantable cardioverter defibrillator (crt-d)  12/14/2012  . Cardiac catheterization  2003?; ~ 11/2012  . Knee arthroscopy Left 1990's  . Lithotripsy      "twice" (12/14/2012)  . Cystoscopy w/ stone manipulation      "3-4 times" (12/14/2012)  . Bi-ventricular implantable cardioverter defibrillator N/A 12/14/2012    Procedure: BI-VENTRICULAR IMPLANTABLE CARDIOVERTER DEFIBRILLATOR (CRT-D); Surgeon: Duke Salvia, MD; Location: [facility] CATH LAB; Service: Cardiovascular; Laterality: N/A;  . Cataract extraction w/phaco Left 05/13/2015    Procedure: CATARACT EXTRACTION PHACO AND INTRAOCULAR LENS PLACEMENT LEFT EYE; CDE: 3.49; Surgeon: Susa Simmonds, MD; Location: [facility] ORS; Service: Ophthalmology; Laterality: Left;  . Cataract extraction w/phaco Right 07/22/2015    Procedure: CATARACT EXTRACTION PHACO AND INTRAOCULAR LENS PLACEMENT RIGHT EYE CDE=4.50; Surgeon: Susa Simmonds, MD; Location: [facility] ORS; Service: Ophthalmology; Laterality: Right;  . Cardiac catheterization N/A 08/22/2015    Procedure: Right/Left Heart Cath and Coronary Angiography; Surgeon: Dolores Patty, MD; Location: [facility]  INVASIVE CV LAB; Service: Cardiovascular; Laterality: N/A;  . Insertion of implantable left ventricular assist device N/A 08/30/2015  Procedure: INSERTION OF IMPLANTABLE LEFT VENTRICULAR ASSIST DEVICE; Surgeon: Kerin Perna, MD; Location: [facility] OR; Service: Open Heart Surgery; Laterality: N/A;  . Intraoperative transesophageal echocardiogram N/A 08/30/2015    Procedure: INTRAOPERATIVE TRANSESOPHAGEAL ECHOCARDIOGRAM; Surgeon: Kerin Perna, MD; Location: [facility] OR; Service: Open Heart Surgery; Laterality: N/A;   Family History  Problem Relation Age of Onset  . Cancer - Prostate Maternal Uncle   . Heart failure Mother   . Stroke Mother   . Asthma Mother   . Heart disease Father   . Heart failure Father   . Congestive Heart Failure Brother     @ 62 Heart transplant, [place]  . Congestive Heart Failure Brother     ICD  . Heart failure Sister     ICD  . Cancer - Ovarian Sister     deceased @ 74   Social History:  reports that he has never smoked. His smokeless tobacco use includes Chew. He reports that he does not drink alcohol or use illicit drugs. Allergies:  Allergies  Allergen Reactions  . Phenergan [Promethazine Hcl] Nausea And Vomiting  . Lasix [Furosemide] Other (See Comments)    "Dropped blood pressure too low"  . Morphine And Related Other (See Comments)    Goes crazy    Medications Prior to Admission  Medication Sig Dispense Refill  . Calcium Carbonate-Vitamin D (CALTRATE 600+D PO) Take 1 tablet by mouth daily.    . Carboxymethylcellulose Sodium (THERATEARS OP) Apply 1 drop to eye 3 (three) times daily as needed (dry eyes).     Marland Kitchen Cod Liver Oil 1000 MG CAPS Take 1 capsule by mouth daily.    . digoxin (LANOXIN) 0.125 MG tablet Take 0.125 mg by mouth daily. Take 0.5 tablet by mouth each morning.    . escitalopram (LEXAPRO) 10 MG tablet Take 10 mg by mouth  daily.    . furosemide (LASIX) 20 MG tablet Take 1 tablet (20 mg total) by mouth daily. 90 tablet 3  . Glucosamine Sulfate 1000 MG CAPS Take 1,000 mg by mouth daily.    . ivabradine (CORLANOR) 5 MG TABS tablet Take 1 tablet (5 mg total) by mouth 2 (two) times daily with a meal. 60 tablet 6  . ketorolac (ACULAR) 0.5 % ophthalmic solution Place 1 drop into the left eye 2 (two) times daily.     . Multiple Vitamin (MULTIVITAMIN) tablet Take 1 tablet by mouth daily.    . pantoprazole (PROTONIX) 40 MG tablet Take 40 mg by mouth daily.    . potassium chloride SA (K-DUR,KLOR-CON) 20 MEQ tablet Take 20 mEq by mouth daily.    . prednisoLONE acetate (PRED FORTE) 1 % ophthalmic suspension Place 1 drop into the left eye 4 (four) times daily.    . Psyllium (METAMUCIL PO) Take 1 tablet by mouth daily.    Marland Kitchen spironolactone (ALDACTONE) 25 MG tablet Take 1 tablet (25 mg total) by mouth daily. 90 tablet 3  . vitamin A 8000 UNIT capsule Take 8,000 Units by mouth 3 (three) times a week.     . vitamin B-12 (CYANOCOBALAMIN) 100 MCG tablet Take 100 mcg by mouth daily.    . vitamin C (ASCORBIC ACID) 500 MG tablet Take 500 mg by mouth every other day.     . zolpidem (AMBIEN) 10 MG tablet Take 10 mg by mouth at bedtime as needed for sleep (Take 0.5 to 1.0 tablet by mouth as needed for insomnia.).      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 Was beginning to feel SOB after ambulating ~300-400 ft. Functional Status:  Mobility: Bed Mobility Overal bed mobility: Needs Assistance Bed Mobility: Supine to Sit Supine to sit: +2 for physical assistance, Mod assist Sit to supine: Max assist Sit to sidelying: Mod assist General bed mobility comments: Assist to move legs off bed and elevate trunk into sitting. Transfers Overall transfer level: Needs assistance Equipment used: 4-wheeled walker Transfer via Lift Equipment: Stedy Transfers: Sit to/from Stand Sit to Stand: Min assist, +2 physical assistance Stand pivot transfers: Min assist, +2 physical assistance General transfer comment: Assist to bring hips up. Had pt place hands on knees Ambulation/Gait Ambulation/Gait assistance: Min assist Ambulation Distance (Feet): 400 Feet Assistive device: 4-wheeled walker Gait Pattern/deviations: Step-through pattern, Decreased stride length, Drifts right/left, Trunk flexed General Gait Details: Pt drifting left with rollator and needed cues to correct. Verbal cues to stand more erect and slow down. Pt required 1 standing rest break. Gait velocity: Too fast for his abilities Gait velocity interpretation: at or above normal speed for age/gender    ADL: ADL Overall ADL's : Needs assistance/impaired Eating/Feeding: Moderate assistance Grooming: Supervision/safety, Set up, Sitting Grooming Details (indicate cue type and reason): Pt able to wash face and comb hair after set up and continued VC. Pt then using comb to "rub lotion on his face". Upper Body Bathing: Minimal assitance, Sitting Lower Body Bathing: Moderate assistance, Sit to/from stand Upper Body Dressing : Moderate assistance, Sitting Lower Body Dressing: Moderate assistance, Sit to/from stand Toilet Transfer: Minimal  assistance, +2 for physical assistance, Stand-pivot, BSC, RW Toileting- Clothing Manipulation and Hygiene: Moderate assistance, Sit to/from stand, +2 for safety/equipment Functional mobility during ADLs: Minimal assistance, +2 for physical assistance, Rolling walker General ADL Comments: Pt requires increased time to complete tasks. Intermittent confusion noted. Pt not yet able to manage power/battery connections. Pt ambulated 500'   Cognition: Cognition Overall Cognitive Status: Impaired/Different from baseline Orientation Level: Oriented X4 Cognition Arousal/Alertness: Awake/alert Behavior During Therapy: WFL for tasks assessed/performed Overall Cognitive Status: Impaired/Different from baseline Area of Impairment: Orientation, Attention, Memory, Following commands, Safety/judgement, Problem solving Orientation Level: Disoriented to, Time, Situation Current Attention Level: Sustained Memory: Decreased recall of precautions, Decreased short-term memory Following Commands: Follows one step commands consistently Safety/Judgement: Decreased awareness of safety, Decreased awareness of deficits Awareness: Intellectual Problem Solving: Slow processing, Decreased initiation, Difficulty sequencing, Requires verbal cues, Requires tactile cues General Comments: motor plannind deficits also noted   Blood pressure 88/75, pulse 70, temperature 98.8 F (37.1 C), temperature source Oral, resp. rate 16, height  (1.88 m), weight 79.2 kg (174 lb 9.7 oz), SpO2 98 %. Physical Exam  Vitals reviewed. Constitutional: He is oriented to person, place, and time. He appears well-developed and well-nourished.  HENT:  Head: Normocephalic and atraumatic.  Eyes: Conjunctivae and EOM are normal.  Neck: Normal range of motion. Neck supple.  Cardiovascular: Normal rate and regular rhythm.  +Hum  Respiratory: Effort normal and  breath sounds normal.  GI: Soft. Bowel sounds are normal.  Musculoskeletal: He  exhibits no edema or tenderness.  Neurological: He is alert and oriented to person, place, and time.  Fair but limited awareness of deficits. Sensation intact to light touch Motor: 4/5 grossly throughout  Skin: Skin is warm and dry.  Psychiatric: He has a normal mood and affect.  ?Slightly altered     Lab Results Last 24 Hours    Results for orders placed or performed during the hospital encounter of 08/21/15 (from the past 24 hour(s))  Glucose, capillary Status: Abnormal   Collection Time: 09/16/15 11:31 AM  Result Value Ref Range   Glucose-Capillary 156 (H) 65 - 99 mg/dL   Comment 1 Notify RN   Glucose, capillary Status: Abnormal   Collection Time: 09/16/15 3:42 PM  Result Value Ref Range   Glucose-Capillary 105 (H) 65 - 99 mg/dL  Lactate dehydrogenase Status: Abnormal   Collection Time: 09/17/15 4:25 AM  Result Value Ref Range   LDH 210 (H) 98 - 192 U/L  Protime-INR Status: Abnormal   Collection Time: 09/17/15 4:25 AM  Result Value Ref Range   Prothrombin Time 21.0 (H) 11.6 - 15.2 seconds   INR 1.81 (H) 0.00 - 1.49  Basic metabolic panel Status: Abnormal   Collection Time: 09/17/15 4:25 AM  Result Value Ref Range   Sodium 139 135 - 145 mmol/L   Potassium 3.9 3.5 - 5.1 mmol/L   Chloride 106 101 - 111 mmol/L   CO2 27 22 - 32 mmol/L   Glucose, Bld 98 65 - 99 mg/dL   BUN 11 6 - 20 mg/dL   Creatinine, Ser 0.94 0.61 - 1.24 mg/dL   Calcium 8.6 (L) 8.9 - 10.3 mg/dL   GFR calc non Af Amer >60 >60 mL/min   GFR calc Af Amer >60 >60 mL/min   Anion gap 6 5 - 15  CBC Status: Abnormal   Collection Time: 09/17/15 4:25 AM  Result Value Ref Range   WBC 4.1 4.0 - 10.5 K/uL   RBC 3.25 (L) 4.22 - 5.81 MIL/uL   Hemoglobin 8.3 (L) 13.0 - 17.0 g/dL   HCT 07.6 (L) 80.8 - 81.1 %   MCV 87.1 78.0 - 100.0 fL   MCH 25.5 (L) 26.0 - 34.0  pg   MCHC 29.3 (L) 30.0 - 36.0 g/dL   RDW 03.1 (H) 59.4 - 58.5 %   Platelets 190 150 - 400 K/uL      Imaging Results (Last 48 hours)    Dg Chest Port 1 View  09/16/2015 CLINICAL DATA: LVAD. Shortness of breath. EXAM: PORTABLE CHEST 1 VIEW COMPARISON: 09/15/2015 FINDINGS: LVAD and left AICD remain in place, unchanged. Right PICC line is unchanged. Cardiomegaly. Left lower lobe atelectasis and suspected small left effusion. No pneumothorax. IMPRESSION: Stable left base atelectasis. Suspect small left effusion. Stable cardiomegaly. Electronically Signed By: Charlett Nose M.D. On: 09/16/2015 09:23     Assessment/Plan: Diagnosis: Deconditioning related to acute on chronic systolic congestive heart failure Labs and images independently reviewed. Records reviewed and summated above.  1. Does the need for close, 24 hr/day medical supervision in concert with the patient's rehab needs make it unreasonable for this patient to be served in a less intensive setting? Potentially  2. Co-Morbidities requiring supervision/potential complications: acute on chronic cardiomyopathy (Monitor in accordance with increased physical activity and avoid UE resistance excercises), left bundle branch block (monitor with increased activity), LVAD, Acute on chronic anemia (transfuse if necessary to ensure appropriate  perfusion for increased activity tolerance), generalized OA (ensure pain does not limit therapies), Prediabetes (Monitor in accordance with exercise and adjust meds as necessary), delrium (adjust sleep/wake cycle, avoid cognitively altering medications), malnutrition (supplment with protein) 3. Due to safety, skin/wound care, disease management and patient education, does the patient require 24 hr/day rehab nursing? Yes 4. Does the patient require coordinated care of a physician, rehab nurse, PT (1-2 hrs/day, 5 days/week) and OT (1-2 hrs/day, 5 days/week) to address physical and functional  deficits in the context of the above medical diagnosis(es)? Yes Addressing deficits in the following areas: endurance, locomotion, strength, transferring, bathing, dressing, toileting, cognition and psychosocial support 5. Can the patient actively participate in an intensive therapy program of at least 3 hrs of therapy per day at least 5 days per week? Yes 6. The potential for patient to make measurable gains while on inpatient rehab is good and fair 7. Anticipated functional outcomes upon discharge from inpatient rehab are n/a with PT, n/a with OT, n/a with SLP. 8. Estimated rehab length of stay to reach the above functional goals is: NA 9. Does the patient have adequate social supports and living environment to accommodate these discharge functional goals? Yes 10. Anticipated D/C setting: Home 11. Anticipated post D/C treatments: HH therapy and Home excercise program 12. Overall Rehab/Functional Prognosis: good  RECOMMENDATIONS: This patient's condition is appropriate for continued rehabilitative care in the following setting: Pt ambulating 300-622ft with PT. He will not require IRF services. Would recommend home with [facility] when medically stable.  Patient has agreed to participate in recommended program. Yes Note that insurance prior authorization may be required for reimbursement for recommended care.  Comment: Rehab Admissions Coordinator to follow up.  Maryla Morrow, MD 09/17/2015       Revision History     Date/Time User Provider Type Action   09/17/2015 2:54 PM Edgar Karis Juba, MD Physician Sign   09/17/2015 11:13 AM Charlton Amor, PA-C Physician Assistant Pend   View Details Report       Routing History     Date/Time From To Method   09/17/2015 2:54 PM Edgar Karis Juba, MD Richardean Chimera, MD Fax

## 2015-09-19 NOTE — H&P (Signed)
Physical Medicine and Rehabilitation Admission H&P    Chief complaint: Weakness  HPI: [name] is a 75 y.o. right handed male with history of cardiomyopathy and left bundle branch block. Per chart review patient is married and independent prior to admission. 2 level home with bedroom on main floor. 3 steps to entry. Family can assist as needed. Presented 08/21/2015 through the heart failure clinic with dizziness and low volume output. 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 Found to be in cardiogenic shock. Workup per cardiology services and cardiothoracic surgery patient underwent LVAD 08/30/2015 per Dr. Lucianne Lei trigt. He was extubated 08/31/2015. Received chest tube placement 09/12/2015 for left pleural effusion drained 1650 mL initially. Bouts of confusion with therapies initiated. Placed on Remeron and Celexa for mood stabilization. Patient presently maintained on aspirin and Coumadin therapy. Acute on chronic anemia 8.5 and monitored. Physical and occupational therapy evaluations completed and ongoing with recommendations of physical medicine rehabilitation consult. Patient was admitted for a comprehensive rehabilitation program  Patient is discussing the events of the day he was admitted.  His wife states he gets a lot of the details wrong. She states that he usually is not forgetful at home.  ROS Constitutional: Positive for malaise/fatigue. Negative for fever and chills.  HENT: Negative for hearing loss.  Eyes: Negative for blurred vision and double vision.  Respiratory: Positive for shortness of breath. Negative for cough.  Cardiovascular: Negative for chest pain.  Gastrointestinal: Positive for constipation. Negative for nausea.   GERD  Genitourinary: Positive for urgency. Negative for dysuria and hematuria.  Musculoskeletal: Positive for myalgias.  Neurological: Positive for weakness. Negative for seizures and headaches.    Psychiatric/Behavioral: The patient has insomnia.  All other systems reviewed and are negative   Past Medical History  Diagnosis Date  . Nephrolithiasis   . GERD (gastroesophageal reflux disease)   . Insomnia   . Nonischemic cardiomyopathy ([place])     Cardiac catheterization 12/2011 with LVEF 45%, no significant obstructive CAD  . Automatic implantable cardioverter-defibrillator in situ   . H/O hiatal hernia   . Osteoarthritis   . Pneumonia     Dec 2016   Past Surgical History  Procedure Laterality Date  . Exploratory laparotomy  2006?  Marland Kitchen Appendectomy    . Volvulus reduction  2007  . Posterior fusion cervical spine  2001  . Bi-ventricular implantable cardioverter defibrillator  (crt-d)  12/14/2012  . Cardiac catheterization  2003?; ~ 11/2012  . Knee arthroscopy Left 1990's  . Lithotripsy      "twice" (12/14/2012)  . Cystoscopy w/ stone manipulation      "3-4 times" (12/14/2012)  . Bi-ventricular implantable cardioverter defibrillator N/A 12/14/2012    Procedure: BI-VENTRICULAR IMPLANTABLE CARDIOVERTER DEFIBRILLATOR  (CRT-D);  Surgeon: Deboraha Sprang, MD;  Location: [facility] CATH LAB;  Service: Cardiovascular;  Laterality: N/A;  . Cataract extraction w/phaco Left 05/13/2015    Procedure: CATARACT EXTRACTION PHACO AND INTRAOCULAR LENS PLACEMENT LEFT EYE;  CDE:  3.49;  Surgeon: Williams Che, MD;  Location: [facility] ORS;  Service: Ophthalmology;  Laterality: Left;  . Cataract extraction w/phaco Right 07/22/2015    Procedure: CATARACT EXTRACTION PHACO AND INTRAOCULAR LENS PLACEMENT RIGHT EYE CDE=4.50;  Surgeon: Williams Che, MD;  Location: [facility] ORS;  Service: Ophthalmology;  Laterality: Right;  . Cardiac catheterization N/A 08/22/2015    Procedure: Right/Left Heart Cath and Coronary Angiography;  Surgeon: Jolaine Artist, MD;  Location: [facility] CV LAB;  Service: Cardiovascular;  Laterality:  N/A;  . Insertion of implantable left ventricular assist device N/A 08/30/2015    Procedure:  INSERTION OF IMPLANTABLE LEFT VENTRICULAR ASSIST DEVICE;  Surgeon: Ivin Poot, MD;  Location: [facility];  Service: Open Heart Surgery;  Laterality: N/A;  . Intraoperative transesophageal echocardiogram N/A 08/30/2015    Procedure: INTRAOPERATIVE TRANSESOPHAGEAL ECHOCARDIOGRAM;  Surgeon: Ivin Poot, MD;  Location: [facility];  Service: Open Heart Surgery;  Laterality: N/A;   Family History  Problem Relation Age of Onset  . Cancer - Prostate Maternal Uncle   . Heart failure Mother   . Stroke Mother   . Asthma Mother   . Heart disease Father   . Heart failure Father   . Congestive Heart Failure Brother     @ 62 Heart transplant, [place]  . Congestive Heart Failure Brother     ICD  . Heart failure Sister     ICD  . Cancer - Ovarian Sister     deceased @ 33   Social History:  reports that he has never smoked. His smokeless tobacco use includes Chew. He reports that he does not drink alcohol or use illicit drugs. Allergies:  Allergies  Allergen Reactions  . Phenergan [Promethazine Hcl] Nausea And Vomiting  . Lasix [Furosemide] Other (See Comments)    "Dropped blood pressure too low"  . Morphine And Related Other (See Comments)    Goes crazy    Medications Prior to Admission  Medication Sig Dispense Refill  . Calcium Carbonate-Vitamin D (CALTRATE 600+D PO) Take 1 tablet by mouth daily.    . Carboxymethylcellulose Sodium (THERATEARS OP) Apply 1 drop to eye 3 (three) times daily as needed (dry eyes).     Marland Kitchen Cod Liver Oil 1000 MG CAPS Take 1 capsule by mouth daily.    . digoxin (LANOXIN) 0.125 MG tablet Take 0.125 mg by mouth daily. Take 0.5 tablet by mouth each morning.    . escitalopram (LEXAPRO) 10 MG tablet Take 10 mg by mouth daily.    . furosemide (LASIX) 20 MG tablet Take 1 tablet (20 mg total) by mouth daily. 90 tablet 3  . Glucosamine Sulfate 1000 MG CAPS Take 1,000 mg by mouth daily.    . ivabradine (CORLANOR) 5 MG TABS tablet Take 1 tablet (5 mg total) by mouth 2 (two) times daily  with a meal. 60 tablet 6  . ketorolac (ACULAR) 0.5 % ophthalmic solution Place 1 drop into the left eye 2 (two) times daily.     . Multiple Vitamin (MULTIVITAMIN) tablet Take 1 tablet by mouth daily.    . pantoprazole (PROTONIX) 40 MG tablet Take 40 mg by mouth daily.    . potassium chloride SA (K-DUR,KLOR-CON) 20 MEQ tablet Take 20 mEq by mouth daily.    . prednisoLONE acetate (PRED FORTE) 1 % ophthalmic suspension Place 1 drop into the left eye 4 (four) times daily.    . Psyllium (METAMUCIL PO) Take 1 tablet by mouth daily.    Marland Kitchen spironolactone (ALDACTONE) 25 MG tablet Take 1 tablet (25 mg total) by mouth daily. 90 tablet 3  . vitamin A 8000 UNIT capsule Take 8,000 Units by mouth 3 (three) times a week.     . vitamin B-12 (CYANOCOBALAMIN) 100 MCG tablet Take 100 mcg by mouth daily.    . vitamin C (ASCORBIC ACID) 500 MG tablet Take 500 mg by mouth every other day.     . zolpidem (AMBIEN) 10 MG tablet Take 10 mg by mouth at bedtime as needed for  sleep (Take 0.5 to 1.0 tablet by mouth as needed for insomnia.).      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  Was beginning to feel SOB after ambulating ~300-400 ft.  Functional Status:  Mobility: Bed Mobility Overal bed mobility: Needs Assistance, +2 for physical assistance Bed  Mobility: Sit to Supine Supine to sit: Mod assist, HOB elevated Sit to supine: Max assist Sit to sidelying: Mod assist General bed mobility comments: Assist to elevate trunk into sitting Transfers Overall transfer level: Needs assistance Equipment used: 2 person hand held assist Transfer via Lift Equipment: Stedy Transfers: Sit to/from Stand Sit to Stand: Min assist Stand pivot transfers: Min assist, +2 physical assistance General transfer comment: Assist to bring hips up and verbal cues to have pt place hands on knees. More assist from low chair than elevated bed Ambulation/Gait Ambulation/Gait assistance: Min guard Ambulation Distance (Feet): 550 Feet Assistive device: 4-wheeled walker Gait Pattern/deviations: Step-through pattern, Decreased stride length, Trunk flexed General Gait Details: Pt handling rollator without difficulty. Verbal cues to stand more erect and look up. Occasional cues to slow pace. Gait velocity: Too fast for his abilities at times. Gait velocity interpretation: at or above normal speed for age/gender    ADL: ADL Overall ADL's : Needs assistance/impaired Eating/Feeding: Moderate assistance Grooming: Wash/dry face, Oral care, Min guard, Standing Grooming Details (indicate cue type and reason): Pt brushed teeth and washed face. Needed VCs for finding toothpaste top and putting it back on toothpaste. Also was going to wash is face when a very dripping washcloth that he had not rung out--VCs to the need to wring it out first so as to not get water all over the place Upper Body Bathing: Minimal assitance, Sitting Lower Body Bathing: Moderate assistance, Sit to/from stand Upper Body Dressing : Moderate assistance, Sitting Lower Body Dressing: Moderate assistance, Sit to/from stand Toilet Transfer: Minimal assistance, +2 for physical assistance Toilet Transfer Details (indicate cue type and reason): simulated sit<>stand from recliner Toileting- Clothing  Manipulation and Hygiene: Moderate assistance, Sit to/from stand, +2 for safety/equipment Functional mobility during ADLs: Minimal assistance, +2 for physical assistance, Rolling walker General ADL Comments: Max verbal and tactile cues to get pt to scoot hips forward/backward in chair. Pt not wanting to use his L hand becuase  he "doesn't want to pull anything loose". Pt appeared confused as to the power source he was connected to. wife also though pt was on battery source when he was connected to wall power. Educated wife that pt needed to be hooked up to his main power source if there was a chance he could fall asleep. Pt then began discussing how someone was "delivering" something and also thought it was "June". wife then asked why pt hadn't learned how to switch power sources himself.   Cognition: Cognition Overall Cognitive Status: Impaired/Different from baseline Orientation Level: Oriented to person, Oriented to place Cognition Arousal/Alertness: Awake/alert Behavior During Therapy: [facility] for  tasks assessed/performed Overall Cognitive Status: Impaired/Different from baseline Area of Impairment: Orientation, Attention, Memory, Following commands, Problem solving, Awareness, Safety/judgement Orientation Level: Disoriented to, Time Current Attention Level: Sustained Memory: Decreased recall of precautions, Decreased short-term memory Following Commands: Follows one step commands consistently Safety/Judgement: Decreased awareness of safety, Decreased awareness of deficits Awareness: Emergent Problem Solving: Slow processing, Difficulty sequencing General Comments: motor plannind deficits also noted   Physical Exam: Blood pressure 84/72, pulse 70, temperature 98.3 F (36.8 C), temperature source Oral, resp. rate 18, height '6\' 2"'$  (1.88 m), weight 79.3 kg (174 lb 13.2 oz), SpO2 97 %. Physical Exam Constitutional: He is oriented to person, place, and time. He appears well-developed and  well-nourished.  HENT:  Head: Normocephalic and atraumatic.  Eyes: Conjunctivae and EOM are normal.  Neck: Normal range of motion. Neck supple.  Cardiovascular: Normal rate and regular rhythm.  +Hum  Respiratory: Effort normal and breath sounds normal.  GI: Soft. Bowel sounds are normal.  Musculoskeletal: He exhibits no edema or tenderness.  Neurological: He is alert and oriented to person, place, and time with some mild delay in processing.  Fair but limited awareness of deficits. Sensation intact to light touch Motor: 4/5 grossly throughout Bilateral deltoid, biceps, triceps, grip, hip flexor, knee extensor, ankle dorsi flexors and plantar flexors Sensation is intact to light touch in bilateral upper and lower limbs Skin: Skin is warm and dry.  Psychiatric: He has a normal mood and affect   Results for orders placed or performed during the hospital encounter of 08/21/15 (from the past 48 hour(s))  Carboxyhemoglobin     Status: Abnormal   Collection Time: 09/17/15 12:25 PM  Result Value Ref Range   Total hemoglobin 8.9 (L) 13.5 - 18.0 g/dL   O2 Saturation 61.9 %   Carboxyhemoglobin 2.0 (H) 0.5 - 1.5 %   Methemoglobin 0.7 0.0 - 1.5 %  Glucose, capillary     Status: Abnormal   Collection Time: 09/17/15  4:24 PM  Result Value Ref Range   Glucose-Capillary 122 (H) 65 - 99 mg/dL   Comment 1 Notify RN    Comment 2 Document in Chart   Glucose, capillary     Status: Abnormal   Collection Time: 09/17/15  9:30 PM  Result Value Ref Range   Glucose-Capillary 130 (H) 65 - 99 mg/dL  Carboxyhemoglobin     Status: Abnormal   Collection Time: 09/18/15  4:44 AM  Result Value Ref Range   Total hemoglobin 8.0 (L) 13.5 - 18.0 g/dL   O2 Saturation 83.4 %   Carboxyhemoglobin 1.8 (H) 0.5 - 1.5 %   Methemoglobin 0.7 0.0 - 1.5 %  Lactate dehydrogenase     Status: Abnormal   Collection Time: 09/18/15  4:50 AM  Result Value Ref Range   LDH 216 (H) 98 - 192 U/L  Protime-INR     Status: Abnormal    Collection Time: 09/18/15  4:50 AM  Result Value Ref Range   Prothrombin Time 22.1 (H) 11.6 - 15.2 seconds   INR 1.95 (H) 0.00 - 1.49  Glucose, capillary     Status: None   Collection Time: 09/18/15  6:25 AM  Result Value Ref Range   Glucose-Capillary 99 65 - 99 mg/dL  Glucose, capillary     Status: Abnormal   Collection Time: 09/18/15 11:22 AM  Result Value Ref Range   Glucose-Capillary 166 (H) 65 - 99 mg/dL   Comment 1 Notify RN    Comment 2 Document in Chart  Basic metabolic panel     Status: Abnormal   Collection Time: 09/18/15 12:04 PM  Result Value Ref Range   Sodium 139 135 - 145 mmol/L   Potassium 3.5 3.5 - 5.1 mmol/L   Chloride 109 101 - 111 mmol/L   CO2 26 22 - 32 mmol/L   Glucose, Bld 135 (H) 65 - 99 mg/dL   BUN 10 6 - 20 mg/dL   Creatinine, Ser 0.73 0.61 - 1.24 mg/dL   Calcium 8.2 (L) 8.9 - 10.3 mg/dL   GFR calc non Af Amer >60 >60 mL/min   GFR calc Af Amer >60 >60 mL/min    Comment: (NOTE) The eGFR has been calculated using the CKD EPI equation. This calculation has not been validated in all clinical situations. eGFR's persistently <60 mL/min signify possible Chronic Kidney Disease.    Anion gap 4 (L) 5 - 15  CBC     Status: Abnormal   Collection Time: 09/18/15 12:04 PM  Result Value Ref Range   WBC 4.4 4.0 - 10.5 K/uL   RBC 3.27 (L) 4.22 - 5.81 MIL/uL   Hemoglobin 8.4 (L) 13.0 - 17.0 g/dL   HCT 28.3 (L) 39.0 - 52.0 %   MCV 86.5 78.0 - 100.0 fL   MCH 25.7 (L) 26.0 - 34.0 pg   MCHC 29.7 (L) 30.0 - 36.0 g/dL   RDW 17.0 (H) 11.5 - 15.5 %   Platelets 178 150 - 400 K/uL  Glucose, capillary     Status: Abnormal   Collection Time: 09/18/15  4:25 PM  Result Value Ref Range   Glucose-Capillary 120 (H) 65 - 99 mg/dL   Comment 1 Notify RN    Comment 2 Document in Chart   Glucose, capillary     Status: Abnormal   Collection Time: 09/18/15  8:56 PM  Result Value Ref Range   Glucose-Capillary 183 (H) 65 - 99 mg/dL  Lactate dehydrogenase     Status: Abnormal    Collection Time: 09/19/15  4:53 AM  Result Value Ref Range   LDH 196 (H) 98 - 192 U/L  Protime-INR     Status: Abnormal   Collection Time: 09/19/15  4:53 AM  Result Value Ref Range   Prothrombin Time 23.5 (H) 11.6 - 15.2 seconds   INR 2.12 (H) 0.00 - 9.37  Basic metabolic panel     Status: Abnormal   Collection Time: 09/19/15  4:53 AM  Result Value Ref Range   Sodium 139 135 - 145 mmol/L   Potassium 4.1 3.5 - 5.1 mmol/L   Chloride 107 101 - 111 mmol/L   CO2 27 22 - 32 mmol/L   Glucose, Bld 85 65 - 99 mg/dL   BUN 9 6 - 20 mg/dL   Creatinine, Ser 0.79 0.61 - 1.24 mg/dL   Calcium 8.4 (L) 8.9 - 10.3 mg/dL   GFR calc non Af Amer >60 >60 mL/min   GFR calc Af Amer >60 >60 mL/min    Comment: (NOTE) The eGFR has been calculated using the CKD EPI equation. This calculation has not been validated in all clinical situations. eGFR's persistently <60 mL/min signify possible Chronic Kidney Disease.    Anion gap 5 5 - 15  CBC     Status: Abnormal   Collection Time: 09/19/15  4:53 AM  Result Value Ref Range   WBC 4.1 4.0 - 10.5 K/uL   RBC 3.31 (L) 4.22 - 5.81 MIL/uL   Hemoglobin 8.5 (L) 13.0 - 17.0 g/dL   HCT  28.5 (L) 39.0 - 52.0 %   MCV 86.1 78.0 - 100.0 fL   MCH 25.7 (L) 26.0 - 34.0 pg   MCHC 29.8 (L) 30.0 - 36.0 g/dL   RDW 50.9 (H) 28.8 - 01.3 %   Platelets 176 150 - 400 K/uL  Carboxyhemoglobin     Status: Abnormal   Collection Time: 09/19/15  4:57 AM  Result Value Ref Range   Total hemoglobin 9.0 (L) 13.5 - 18.0 g/dL   O2 Saturation 27.2 %   Carboxyhemoglobin 2.1 (H) 0.5 - 1.5 %   Methemoglobin 0.8 0.0 - 1.5 %  Glucose, capillary     Status: Abnormal   Collection Time: 09/19/15  6:23 AM  Result Value Ref Range   Glucose-Capillary 106 (H) 65 - 99 mg/dL   Dg Chest 2 View  0/50/8461  CLINICAL DATA:  Follow-up positioning of left ventricular assist device placed on June 23rd; no current chest complaints. EXAM: CHEST  2 VIEW COMPARISON:  Portable chest x-ray of September 16, 2015  FINDINGS: The left ventricular assist device appears to be in stable position positioned over the cardiac apex. The cardiac silhouette remains mildly enlarged. The pulmonary vascularity is normal. There is noalveolar infiltrate. Small pleural effusions layer posteriorly, bilaterally. The permanent pacemaker defibrillator is in stable position. The sternal wires are intact. The PICC line tip projects over the midportion of the SVC. There is calcification in the wall of the aortic arch. The observed bony structures exhibit no acute abnormalities. IMPRESSION: Stable appearance of the left ventricular assist device. Stable mild cardiomegaly without pulmonary vascular congestion or pulmonary edema. Bilateral small posterior pleural effusions. Aortic atherosclerosis. Electronically Signed   By: David  [place] M.D.   On: 09/18/2015 09:01       Medical Problem List and Plan: 1.  Debilitation secondary to acute on chronic systolic congestive heart failure/recent LVAD 08/30/2015 complicated by left pleural effusion with chest tube 2.  DVT Prophylaxis/Anticoagulation: Chronic Coumadin therapy. Monitor for any bleeding episodes 3. Pain Management: Ultram as needed 4. Mood: Remeron 15 mg daily at bedtime, Zyprexa 5 mg daily at bedtime, Celexa 10 mg daily 5. Neuropsych: This patient is capable of making decisions on his own behalf. 6. Skin/Wound Care: Routine skin checks 7. Fluids/Electrolytes/Nutrition: Routine I&O's with follow-up chemistries 8. Acute blood loss anemia. Follow-up CBC, Hemoglobins running in the 8 range 9. Hypertension/atrial fibrillation. Amiodarone 200 mg twice a day, Aldactone 25 mg daily. Monitor with increased mobility 10. BPH. Flomax 0.4 mg daily. Check PVR 3 11. Decreased nutritional storage. Follow-up dietary consult 12.  Memory deficits,, per wife this was not a pre-existing problem.  May have some element of encephalopathy  associated with his acute illness.We will ask speech therapy  to evaluate  Post Admission Physician Evaluation: 1. Functional deficits secondary  to Debilitation  Secondary to chronic systolic congestive heart failure. 2. Patient is admitted to receive collaborative, interdisciplinary care between the physiatrist, rehab nursing staff, and therapy team. 3. Patient's level of medical complexity and substantial therapy needs in context of that medical necessity cannot be provided at a lesser intensity of care such as a SNF. 4. Patient has experienced substantial functional loss from his/her baseline which was documented above under the "Functional History" and "Functional Status" headings.  Judging by the patient's diagnosis, physical exam, and functional history, the patient has potential for functional progress which will result in measurable gains while on inpatient rehab.  These gains will be of substantial and practical use upon discharge  in  facilitating mobility and self-care at the household level. 5. Physiatrist will provide 24 hour management of medical needs as well as oversight of the therapy plan/treatment and provide guidance as appropriate regarding the interaction of the two. 6. 24 hour rehab nursing will assist with bladder management, bowel management, safety, skin/wound care, disease management, medication administration, pain management and patient education  and help integrate therapy concepts, techniques,education, etc. 7. PT will assess and treat for/with: pre gait, gait training, endurance , safety, equipment, neuromuscular re education.   Goals are: Sup. 8. OT will assess and treat for/with: ADLs, Cognitive perceptual skills, Neuromuscular re education, safety, endurance, equipment.   Goals are: ModI/Sup. Therapy may not proceed with showering this patient. 9. SLP will assess and treat for/with: Memory, attention, concentration, problem solving.  Goals are: Modified independent. 10. Case Management and Social Worker will assess and treat for  psychological issues and discharge planning. 11. Team conference will be held weekly to assess progress toward goals and to determine barriers to discharge. 12. Patient will receive at least 3 hours of therapy per day at least 5 days per week. 13. ELOS: 5-7 days       14. Prognosis:  good     Charlett Blake M.D. [facility] FAAPM&R (Sports Med, Neuromuscular Med) Diplomate Am Board of Electrodiagnostic Med   09/19/2015

## 2015-09-19 NOTE — Progress Notes (Signed)
Patient ID: [name], male   DOB: 27-Feb-1941, 75 y.o.   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 Normal cors. EF 10% with low cardiac output.   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    Foley placed 7/3 for urinary retention. S/P CT with 1700 cc fluid out.   Ramp echo 7/10 with increase in speed to 9400 rpm.   Mental status continues to improved. Slept last night. On Zyprexa and Celexa. Continues to ambulate with PT.   Complains of fatigue. Denies SOB.   Ambulated 550 feet but requires assist x1.   LDH 196 MAPs 70s   Flow: 5.1   L/min  Speed: 9400 Pump flow 5.1  Power 6 PI 4.9. Occasional PI events. On 7/9 had Power Spike 10.4.               Objective:   Vital Signs: MAPs 70s   Temp:  [98.1 F (36.7 C)-98.3 F (36.8 C)] 98.3 F (36.8 C) (07/13 0500) Pulse Rate:  [70-96] 70 (07/13 0500) Resp:  [18] 18 (07/12 1900) BP: (80-97)/(51-82) 84/72 mmHg (07/13 0811) SpO2:  [97 %-100 %] 97 % (07/13 0500) Weight:  [174 lb 13.2 oz (79.3 kg)] 174 lb 13.2 oz (79.3 kg) (07/13 0500) Last BM Date: 09/11/15  Weight change: Filed Weights   09/17/15 0445 09/18/15 0451 09/19/15 0500  Weight: 174 lb 9.7 oz (79.2 kg) 174 lb 13.2 oz (79.3 kg) 174 lb 13.2 oz (79.3 kg)    Intake/Output:   Intake/Output Summary (Last 24 hours) at 09/19/15 0955 Last data filed at 09/19/15 0900  Gross per 24 hour  Intake    600 ml  Output   1625 ml  Net  -1025 ml    MAPS  70s   Physical Exam: General: In the chair.  NAD.  HEENT: normal  Neck: supple. JVP ~10  Cor:  LVAD hum.  Lungs: RLL LLL decreased Abdomen: soft, nondistended. No hepatosplenomegaly. Bowel sound present. Driveline site ok. Securement device in place.  Extremities: no cyanosis, clubbing, rash, R and LLE ted hose 1 + edema. RUE PICC Neuro:  Alert and oriented to person and place GU: Condom Cath   Telemetry:  Sinus with  PVCs  Labs: Basic Metabolic Panel:  Recent Labs Lab 09/15/15 0336 09/16/15 0415 09/17/15 0425 09/18/15 1204 09/19/15 0453  NA 137 137 139 139 139  K 3.9 4.0 3.9 3.5 4.1  CL 108 107 106 109 107  CO2 26 26 27 26 27   GLUCOSE 93 98 98 135* 85  BUN 9 9 11 10 9   CREATININE 0.75 0.77 0.77 0.73 0.79  CALCIUM 8.3* 8.4* 8.6* 8.2* 8.4*    Liver Function Tests: No results for input(s): AST, ALT, ALKPHOS, BILITOT, PROT, ALBUMIN in the last 168 hours. No results for input(s): LIPASE, AMYLASE in the last 168 hours. No results for input(s): AMMONIA in the last 168 hours.  CBC:  Recent Labs Lab 09/15/15 0336 09/16/15 0415 09/17/15 0425 09/18/15 1204 09/19/15 0453  WBC 4.5 4.7 4.1 4.4 4.1  HGB 7.9* 8.6* 8.3* 8.4* 8.5*  HCT 26.3* 28.6* 28.3* 28.3* 28.5*  MCV 86.8 86.7 87.1 86.5 86.1  PLT 201 200 190 178 176    Cardiac Enzymes: No results for input(s): CKTOTAL, CKMB, CKMBINDEX, TROPONINI in the last 168 hours.  BNP: BNP (last 3 results)  Recent Labs  05/13/15 2248  08/21/15 1345 08/31/15 0400  BNP 1235.6* 2132.9* 1387.0*    ProBNP (last 3 results) No results for input(s): PROBNP in the last 8760 hours.    Other results:  Imaging: Dg Chest 2 View  09/18/2015  CLINICAL DATA:  Follow-up positioning of left ventricular assist device placed on June 23rd; no current chest complaints. EXAM: CHEST  2 VIEW COMPARISON:  Portable chest x-ray of September 16, 2015 FINDINGS: The left ventricular assist device appears to be in stable position positioned over the cardiac apex. The cardiac silhouette remains mildly enlarged. The pulmonary vascularity is normal. There is noalveolar infiltrate. Small pleural effusions layer posteriorly, bilaterally. The permanent pacemaker defibrillator is in stable position. The sternal wires are intact. The PICC line tip projects over the midportion of the SVC. There is calcification in the wall of the aortic arch. The observed bony structures exhibit no  acute abnormalities. IMPRESSION: Stable appearance of the left ventricular assist device. Stable mild cardiomegaly without pulmonary vascular congestion or pulmonary edema. Bilateral small posterior pleural effusions. Aortic atherosclerosis. Electronically Signed   By: David  [place] M.D.   On: 09/18/2015 09:01     Medications:     Scheduled Medications: . amiodarone  200 mg Oral BID  . aspirin EC  81 mg Oral Daily  . bisacodyl  10 mg Oral Daily   Or  . bisacodyl  10 mg Rectal Daily  . citalopram  10 mg Oral Daily  . feeding supplement  1 Container Oral TID BM  . ferrous fumarate-b12-vitamic C-folic acid  1 capsule Oral TID PC  . mirtazapine  15 mg Oral QHS  . OLANZapine  5 mg Oral QHS  . pantoprazole  40 mg Oral Daily  . sodium chloride flush  10-40 mL Intracatheter Q12H  . sodium chloride flush  3 mL Intravenous Q12H  . spironolactone  25 mg Oral Daily  . tamsulosin  0.4 mg Oral Daily  . Warfarin - Pharmacist Dosing Inpatient   Does not apply q1800    Infusions: . sodium chloride Stopped (08/31/15 1700)  . sodium chloride 250 mL (08/31/15 0540)  . sodium chloride 10 mL/hr (09/08/15 1609)  . sodium chloride 10 mL/hr at 09/14/15 2000  . lactated ringers Stopped (08/30/15 1500)  . lactated ringers Stopped (09/02/15 1200)    PRN Medications: sodium chloride, acetaminophen, antiseptic oral rinse, docusate sodium, fentaNYL (SUBLIMAZE) injection, ondansetron (ZOFRAN) IV, polyvinyl alcohol, sodium chloride flush, sodium chloride flush, traMADol   Assessment:   1. 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 EF 15%. Cath 8/14 with normal cors. 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  2. Frequent PVCs 3. Severe Malnutrition- Prealbumin 14.7 on 6/20 4. Acute post-op delirium/sundowning 5. Urinary Retention requiring Foley 6. Left pleural effusion s/p chest tube   Plan/Discussion:    Mental status continues to improve. Continue Zyprexa and Celexa.  CO-OX 72%.  Volume status mildly elevated.  MAP 70s. Continue spiro 25 mg daily and continue  40 mg  po lasix.   INR 2.12 .  Pharmacy dosing coumadin.    Foley out yesterday . No post void residual. Continue condom cath. Continue Flomax.   VAD parameters reviewed. Isolated power spike noted (10.4). Event log was reviewed thought to be pump burn in. INR and LDH stable.    Continue to work with PT/OT. CIR MD evaluated. CIR denied. Recommending HH. Home once family can manage at home  His wife is very concerned about going home. PT and OT recommended CIR  again. ?    Length of Stay: 67  Amy Clegg, NP-C 9:55 AM   Patient seen NP, agree with the above note.  He is stable today.  Now he is going to be able to go to inpatient rehab.  Family needs teaching today, LVAD nurse to come by.  He should be able to go to inpatient rehab this afternoon after teaching.    Marca Ancona 09/19/2015 12:30 PM

## 2015-09-19 NOTE — Progress Notes (Signed)
Gunnar Fusi Rehab Admission Coordinator Signed Physical Medicine and Rehabilitation PMR Pre-admission 09/19/2015 2:27 PM  Related encounter: Admission (Current) from 08/21/2015 in [facility] Collapse All   PMR Admission Coordinator Pre-Admission Assessment  Patient: [name] is an 75 y.o., male MRN: [id number] DOB: 1940-05-25 Height: 6' 2" (188 cm) Weight: 79.3 kg (174 lb 13.2 oz)  Insurance Information HMO: PPO: PCP: IPA: 80/20: OTHER:  PRIMARY: Medicare A and B Policy#: [id number] Subscriber: Self CM Name: Phone#: Fax#:  Pre-Cert#: Employer: Full time  Benefits: Phone #: Name:  Eff. Date: 10/08/06 Deduct: $1,316 Out of Pocket Max: none Life Max: none CIR: 100% SNF: 100% days 1-20, 80% days 21-100 Outpatient: PT/OT/SLP Co-Pay: 80% Home Health: PT/OT/SLP Co-Pay: none DME: 80% Co-Pay: 20% Providers: patient's choice   SECONDARY: BCBS Supplement Policy#: [id number] Subscriber: self CM Name: Phone#: Fax#:  Pre-Cert#: Employer: full time Benefits: Phone #: [phone] Name:  Eff. Date: Deduct: Out of Pocket Max: Life Max:  CIR: SNF:  Outpatient: Co-Pay:  Home Health: Co-Pay:  DME: Co-Pay:   Medicaid Application Date: Case Manager:  Disability Application Date: Case Worker:   Emergency Contact Information Contact Information    Name Relation Home Work Mobile   Taylor Spouse [phone]  [phone]   Evans,Rhonda Daughter [phone]  [phone]   Martin,Jennifer    [phone]     Current Medical History  Patient Admitting Diagnosis: Deconditioning  related to acute on chronic systolic congestive heart failure  History of Present Illness: [name] is a 75 y.o. right handed male with history of cardiomyopathy and left bundle branch block. Per chart review patient is married and independent prior to admission. 2 level home with bedroom on main floor. 3 steps to entry. Family can assist as needed. Presented 08/21/2015 through the heart failure clinic with dizziness and low volume output. 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 Found to be in cardiogenic shock. Workup per cardiology services and cardiothoracic surgery patient underwent LVAD 08/30/2015 per Dr. Lucianne Lei trigt. He was extubated 08/31/2015. Received chest tube placement 09/12/2015 for left pleural effusion drained 1650 mL initially. Bouts of confusion with therapies initiated. Placed on Remeron and Celexa for mood stabilization. Patient presently maintained on aspirin and Coumadin therapy. Acute on chronic anemia 8.5 and monitored. Physical and occupational therapy evaluations completed and ongoing with recommendations of physical medicine rehabilitation consult. Patient was admitted for a comprehensive rehabilitation program.      Past Medical History  Past Medical History  Diagnosis Date  . Nephrolithiasis   . GERD (gastroesophageal reflux disease)   . Insomnia   . Nonischemic cardiomyopathy ([place])     Cardiac catheterization 12/2011 with LVEF 45%, no significant obstructive CAD  . Automatic implantable cardioverter-defibrillator in situ   . H/O hiatal hernia   . Osteoarthritis   . Pneumonia     Dec 2016    Family History  family history includes Asthma in his mother; Cancer - Ovarian in his sister; Cancer - Prostate in his maternal uncle; Congestive Heart Failure in his brother and brother; Heart disease in his father; Heart failure in his father, mother, and sister; Stroke in his mother.  Prior  Rehab/Hospitalizations:  Has the patient had major surgery during 100 days prior to admission? No  Current Medications   Current facility-administered medications:  . 0.45 % sodium chloride infusion, , Intravenous, Continuous PRN, Ivin Poot, MD, Stopped at 08/31/15 1700 . 0.9 %  sodium chloride infusion, 250 mL, Intravenous, Continuous, Ivin Poot, MD, Last Rate: 1 mL/hr at 08/31/15 0540, 250 mL at 08/31/15 0540 . 0.9 % sodium chloride infusion, , Intravenous, Continuous, Ivin Poot, MD, Last Rate: 10 mL/hr at 09/08/15 1609, 10 mL/hr at 09/08/15 1609 . 0.9 % sodium chloride infusion, , Intravenous, Continuous, Ivin Poot, MD, Last Rate: 10 mL/hr at 09/14/15 2000 . acetaminophen (TYLENOL) tablet 650 mg, 650 mg, Oral, Q4H PRN, Amy D Clegg, NP, 650 mg at 09/19/15 0934 . amiodarone (PACERONE) tablet 200 mg, 200 mg, Oral, BID, Jolaine Artist, MD, 200 mg at 09/19/15 1041 . antiseptic oral rinse (BIOTENE) solution 15 mL, 15 mL, Topical, PRN, Melton Alar, PA-C . aspirin EC tablet 81 mg, 81 mg, Oral, Daily, 81 mg at 09/19/15 1041 **OR** [DISCONTINUED] aspirin chewable tablet 324 mg, 324 mg, Per Tube, Daily, 324 mg at 09/01/15 1006 **OR** [DISCONTINUED] aspirin suppository 300 mg, 300 mg, Rectal, Daily, Ivin Poot, MD . bisacodyl (DULCOLAX) EC tablet 10 mg, 10 mg, Oral, Daily, 10 mg at 09/19/15 1041 **OR** bisacodyl (DULCOLAX) suppository 10 mg, 10 mg, Rectal, Daily, Ivin Poot, MD . citalopram (CELEXA) tablet 10 mg, 10 mg, Oral, Daily, Jolaine Artist, MD, 10 mg at 09/19/15 1041 . docusate sodium (COLACE) capsule 100 mg, 100 mg, Oral, BID PRN, Ivin Poot, MD, 100 mg at 09/17/15 1127 . feeding supplement (BOOST / RESOURCE BREEZE) liquid 1 Container, 1 Container, Oral, TID BM, Ivin Poot, MD, 1 Container at 09/18/15 2159 . fentaNYL (SUBLIMAZE) injection 50 mcg, 50 mcg, Intravenous, Q1H PRN, Ivin Poot, MD, 25 mcg at 09/12/15 2145 .  ferrous [id number] C-folic acid (TRINSICON / FOLTRIN) capsule 1 capsule, 1 capsule, Oral, TID PC, Ivin Poot, MD, 1 capsule at 09/19/15 1040 . furosemide (LASIX) tablet 40 mg, 40 mg, Oral, Daily, Amy D Clegg, NP . lactated ringers infusion, , Intravenous, Continuous, Ivin Poot, MD, Stopped at 08/30/15 1500 . lactated ringers infusion, , Intravenous, Continuous, Ivin Poot, MD, Stopped at 09/02/15 1200 . mirtazapine (REMERON) tablet 15 mg, 15 mg, Oral, QHS, Ivin Poot, MD, 15 mg at 09/18/15 2159 . OLANZapine (ZYPREXA) tablet 5 mg, 5 mg, Oral, QHS, Marianne L York, PA-C, 5 mg at 09/18/15 2200 . ondansetron (ZOFRAN) injection 4 mg, 4 mg, Intravenous, Q6H PRN, Ivin Poot, MD, 4 mg at 09/01/15 1257 . pantoprazole (PROTONIX) EC tablet 40 mg, 40 mg, Oral, Daily, Ivin Poot, MD, 40 mg at 09/19/15 1041 . polyvinyl alcohol (LIQUIFILM TEARS) 1.4 % ophthalmic solution 1 drop, 1 drop, Both Eyes, QID PRN, Bobby Rumpf York, PA-C . sodium chloride flush (NS) 0.9 % injection 10-40 mL, 10-40 mL, Intracatheter, Q12H, Ivin Poot, MD, 10 mL at 09/16/15 1024 . sodium chloride flush (NS) 0.9 % injection 10-40 mL, 10-40 mL, Intracatheter, PRN, Ivin Poot, MD, 20 mL at 09/19/15 0453 . sodium chloride flush (NS) 0.9 % injection 3 mL, 3 mL, Intravenous, Q12H, Ivin Poot, MD, 3 mL at 09/14/15 0926 . sodium chloride flush (NS) 0.9 % injection 3 mL, 3 mL, Intravenous, PRN, Ivin Poot, MD . spironolactone (ALDACTONE) tablet 25 mg, 25 mg, Oral, Daily, Amy D Clegg, NP, 25 mg at 09/19/15 1041 . tamsulosin (FLOMAX) capsule 0.4 mg, 0.4 mg, Oral, Daily, Jolaine Artist, MD, 0.4 mg at 09/19/15 1041 . traMADol (ULTRAM) tablet 50-100 mg, 50-100 mg, Oral, Q4H PRN, Ivin Poot, MD, 100 mg at 09/13/15 1341 . warfarin (COUMADIN)  tablet 4 mg, 4 mg, Oral, ONCE-1800, Lyndee Leo, RPH . Warfarin - Pharmacist Dosing Inpatient, , Does not apply, q1800, Ivin Poot, MD  Patients Current Diet: Diet regular Room service appropriate?: Yes; Fluid consistency:: Thin Diet - low sodium heart healthy  Precautions / Restrictions Precautions Precautions: Sternal, Other (comment) Precaution Comments: LVAD  Restrictions Weight Bearing Restrictions: Yes Other Position/Activity Restrictions: sternal precautions   Has the patient had 2 or more falls or a fall with injury in the past year?No  Prior Activity Level Community (5-7x/wk): Prior to admission patient was workign daily in Environmental health practitioner, Loss adjuster, chartered, and supervising installing of [employer]. He was completely independent, met friends for breakfast daily, and also enjoyed doing yard work.   Home Assistive Devices / Equipment Home Assistive Devices/Equipment: None Home Equipment: Shower seat - built in, Other (comment)  Prior Device Use: Indicate devices/aids used by the patient prior to current illness, exacerbation or injury? 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 Was beginning to feel SOB after ambulating ~300-400 ft.  Self Care: Did the patient need help bathing, dressing, using the toilet or eating? Independent  Indoor Mobility: Did the patient need assistance with walking from room to room (with or without device)? Independent  Stairs: Did the patient need assistance with internal or external stairs (with or without device)? Independent  Functional Cognition: Did the patient need help planning regular tasks such as shopping or remembering to take medications? Independent  Current Functional Level Cognition  Overall Cognitive Status: Impaired/Different from baseline Current Attention Level: Sustained Orientation Level: Oriented to person, Oriented to place Following Commands: Follows one step commands  consistently Safety/Judgement: Decreased awareness of safety, Decreased awareness of deficits General Comments: motor plannind deficits also noted    Extremity Assessment (includes Sensation/Coordination)  Upper Extremity Assessment: Generalized weakness  Lower Extremity Assessment: Generalized weakness    ADLs  Overall ADL's : Needs assistance/impaired Eating/Feeding: Moderate assistance Grooming: Wash/dry face, Oral care, Min guard, Standing Grooming Details (indicate cue type and reason): Pt brushed teeth and washed face. Needed VCs for finding toothpaste top and putting it back on toothpaste. Also was going to wash is face when a very dripping washcloth that he had not rung out--VCs to the need to wring it out first so as to not get water all over the place Upper Body Bathing: Minimal assitance, Sitting Lower Body Bathing: Moderate assistance, Sit to/from stand Upper Body Dressing : Moderate assistance, Sitting Lower Body Dressing: Moderate assistance, Sit to/from stand Toilet Transfer: Minimal assistance, +2 for physical assistance Toilet Transfer Details (indicate cue type and reason): simulated sit<>stand from recliner Toileting- Clothing Manipulation and Hygiene: Moderate assistance, Sit to/from stand, +2 for safety/equipment Functional mobility during ADLs: Minimal assistance, +2 for physical assistance, Rolling walker General ADL Comments: Max verbal and tactile cues to get pt to scoot hips forward/backward in chair. Pt not wanting to use his L hand becuase he "doesn't want to pull anything loose". Pt appeared confused as to the power source he was connected to. wife also though pt was on battery source when he was connected to wall power. Educated wife that pt needed to be hooked up to his main power source if there was a chance he could fall asleep. Pt then began discussing how someone was "delivering" something and also thought it was "June". wife then asked  why pt hadn't  learned how to switch power sources himself.     Mobility  Overal bed mobility: Needs Assistance, +2 for physical assistance Bed Mobility: Sit to Supine Supine to sit: Mod assist, HOB elevated Sit to supine: Max assist Sit to sidelying: Mod assist General bed mobility comments: Assist to elevate trunk into sitting    Transfers  Overall transfer level: Needs assistance Equipment used: 2 person hand held assist Transfer via Lift Equipment: Stedy Transfers: Sit to/from Stand Sit to Stand: Min assist Stand pivot transfers: Min assist, +2 physical assistance General transfer comment: Assist to bring hips up and verbal cues to have pt place hands on knees. More assist from low chair than elevated bed    Ambulation / Gait / Stairs / Wheelchair Mobility  Ambulation/Gait Ambulation/Gait assistance: Min guard Ambulation Distance (Feet): 550 Feet Assistive device: 4-wheeled walker Gait Pattern/deviations: Step-through pattern, Decreased stride length, Trunk flexed General Gait Details: Pt handling rollator without difficulty. Verbal cues to stand more erect and look up. Occasional cues to slow pace. Gait velocity: Too fast for his abilities at times. Gait velocity interpretation: at or above normal speed for age/gender    Posture / Balance Dynamic Sitting Balance Sitting balance - Comments: Sat EOB x 5 minutes with supervision Balance Overall balance assessment: Needs assistance Sitting-balance support: No upper extremity supported Sitting balance-Leahy Scale: Fair Sitting balance - Comments: Sat EOB x 5 minutes with supervision Postural control: Posterior lean Standing balance support: Bilateral upper extremity supported Standing balance-Leahy Scale: Poor Standing balance comment: UE support High level balance activites: Backward walking, Direction changes, Turns, Sudden stops High Level Balance Comments: Instability and min assist provided with backward walking, direction  changes and turns.  Standardized Balance Assessment Standardized Balance Assessment : Dynamic Gait Index Dynamic Gait Index Level Surface: Mild Impairment (dec gait speed) Change in Gait Speed: Normal Gait with Horizontal Head Turns: Mild Impairment Gait with Vertical Head Turns: Mild Impairment Step Over Obstacle: Severe Impairment    Special needs/care consideration BiPAP/CPAP: No CPM: No Continuous Drip IV: No Dialysis: No  Life Vest: No but patient has a LVAD  Oxygen: No Special Bed: No Trach Size: no Wound Vac (area): No  Skin: Skin tears on right and left arms  Bowel mgmt: 09/11/15 Bladder mgmt: condom cath  Diabetic mgmt: No     Previous Home Environment Living Arrangements: Spouse/significant other Available Help at Discharge: Family, Available 24 hours/day Type of Home: House Home Layout: Two level, Able to live on main level with bedroom/bathroom Home Access: Stairs to enter Entrance Stairs-Rails: Can reach both, Left, Right Entrance Stairs-Number of Steps: 3 Bathroom Shower/Tub: Walk-in shower Home Care Services: No Additional Comments: Pt has almost every home equipment imaginable as 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  Discharge Living Setting Plans for Discharge Living Setting: Patient's home, House Type of Home at Discharge: House Discharge Home Layout: Two level, Able to live on main level with bedroom/bathroom Alternate Level Stairs-Rails: Left Alternate Level Stairs-Number of Steps: 14 Discharge Home Access: Stairs to enter Entrance Stairs-Rails: Left Entrance Stairs-Number of Steps: 4 from garage with left rail; 2 from back with no rails  Discharge Bathroom Shower/Tub: Walk-in shower, Door (benches) Discharge Bathroom Toilet: Handicapped height Discharge Bathroom Accessibility: Yes How Accessible: Accessible via walker Does the patient have any problems obtaining your medications?:  No  Social/Family/Support Systems Patient Roles: Spouse, Parent Anticipated Caregiver: Len Blalock [phone] cell Anticipated Caregiver's Contact Information: [phone] home  Ability/Limitations of Caregiver: Wife can only provide  Sueprvision assist  Caregiver Availability: 24/7 Discharge Plan Discussed with Primary Caregiver: Yes Is Caregiver In Agreement with Plan?: Yes Does Caregiver/Family have Issues with Lodging/Transportation while Pt is in Rehab?: No  Goals/Additional Needs Patient/Family Goal for Rehab: PT/OT Supervision  Expected length of stay: 5-7 days  Cultural Considerations: None Dietary Needs: None Equipment Needs: TBD Special Service Needs: on going LVAD teaching  Additional Information: Family has been and will need to continue to provide 24/7 Supervision due to confusion to ensure that he does not pull at his LVAD drive line  Pt/Family Agrees to Admission and willing to participate: Yes Program Orientation Provided & Reviewed with Pt/Caregiver Including Roles & Responsibilities: Yes Additional Information Needs: Contact the LVAD team for questions as needed  Information Needs to be Provided By: LVAD team as a resource   Decrease burden of Care through IP rehab admission: No  Possible need for SNF placement upon discharge: Not anticipated   Patient Condition: This patient's medical and functional status has changed since the consult dated 09/17/15 in which the Rehabilitation Physician determined and documented that the patient was potentially appropriate for intensive rehabilitative care in an inpatient rehabilitation facility. Issues have been addressed and update has been discussed with Dr. Letta Pate  and patient now appropriate for inpatient rehabilitation. Will admit to inpatient rehab today.   Preadmission Screen Completed By: Gunnar Fusi, 09/19/2015 2:37 PM ______________________________________________________________________  Discussed  status with Dr. Letta Pate on 09/19/15 at 1445 and received telephone approval for admission today.  Admission Coordinator: Gunnar Fusi, time 1445/Date 09/19/15          Cosigned by: Charlett Blake, MD at 09/19/2015 3:15 PM  Revision History     Date/Time User Provider Type Action   09/19/2015 3:15 PM Charlett Blake, MD Physician Cosign   09/19/2015 2:45 PM Gunnar Fusi Rehab Admission Coordinator Sign

## 2015-09-19 NOTE — Progress Notes (Signed)
VAD Discharge Teaching Note:  Discharge VAD teaching completed with [name] and his wife, Carney Bern.     The home inspection checklist has been reviewed and no unsafe conditions have been identified. Family reports that there are at least two dedicated grounded, 3-prong outlets with clearly labeled circuit breaker has been established in the bedroom for power module and Magazine features editor.   Both patient and caregivers have been trained on the following:  1. HM II LVAD overview of system operations  2. Overview of major lifestyle accommodations and cautions   3. Overview of system components (features and functions) 4. Changing power sources 5. Overview of alerts and alarms 6. How to identify and manage an emergency including when pump is running and when pump has stopped  7. Changing system controller 8. Maintain emergency contact list and medications  The patient and caregiver(s) have successfully demonstrated:  1. Changing power source (from batteries to MPU, MPU to  batteries, and replacing batteries) 2. Perform system controller self test  3. Perform power module self test  4. Check and charge batteries  5. Change system controller 6. Paged VAD pager and programmed number in phones  A daily flow sheet with patient  weight, temperature,  flow, speed, power, and PI, along with daily self checks on system controller and power module have been performed by patient and caregiver(s) during hospitalization and will also be done daily at home.   The caregiver(s) has been trained on percutaneous lead exit site care, care of the driveline and dressing changes. They have performed dressing changes during patient's hospitalization under my supervision with the support of the nursing staff. The importance of lead immobilization has been stressed to patient and caregiver(s) using the attachment device. The caregiver has successfully demonstrated the following: 1. Cleansing site with sterile technique 2.  Dressing care and maintenance  3. Immobilizing driveline  The following routine activities and maintenance have been reviewed with patient and caregiver(s) and both verbalize understanding:  1. Stressed importance of never disconnecting power from both controller power leads at the same time, and never disconnecting both batteries at the same time, or the pump will stop 2. Plug the Mobile Power Unit (MPU) or the universal Magazine features editor (UBC) into properly grounded (3 prong) outlets dedicated to PM use. Do NOT use adapter (cheater plug) for ungrounded outlets or multiple portable socket outlets (power strips) 3. Do not connect the MPU or UBC to an outlet controlled by wall switch or the device may not work 4. The MPU does not have an internal battery. The AA batteries that are in the unit provide power only to the light speaker to the unit. In the event external power is removed from the MPU the patient's system controller back up battery will provide power to the pump for 15 minutes until batteries can be applied.  5. AA batteries are to be replaced every 6 months to the MPU.  7. Keep a backup system controller, charged batteries, battery clips, and flashlight near you during sleep in case of electrical power outage 8. Clean battery, battery clip, and universal battery charger contacts weekly 9. Visually inspect percutaneous lead daily 10. Check cables and connectors when changing power source  11. Rotate batteries; keep all eight batteries charged 12. Always have backup system controller, battery clips, fully charged batteries, and spare fully charged batteries when traveling 13. Re-calibrate batteries every 70 uses; monitor battery life of 36 months or 360 uses; replace batteries at end of battery life  Identified the following changes in activities of daily living with pump:  1. No driving for at least six weeks and then only if doctor gives permission to do so 2. No tub baths while pump  implanted, and shower only if doctor gives permission 3. No swimming or submersion in water while implanted with pump 4. Keep all VAD equipment away from water or moisture 5. Keep all VAD connections clean and dry 6. No contact sports or engage in jumping activities 7. Avoid strong static electricity (touching TV/computer screens, vacuuming) 8. Never have an MRI while implanted with the pump 9. Never leave or store batteries in extremely hot or cold places (such as   trunk of your car), or the battery life will be shortened 10. Call the doctor or hospital contact person if any change in how the pump sounds, feels, or works 11. Plan to sleep only when connected to the power module. 12. Keep a backup system controller, charged batteries, battery clips, and flashlight near you during sleep in case of electrical power outage 13. Do not sleep on your stomach 14. Talk with doctor before any long distance travel plans 15. Patient will need antibiotics prior to any dental procedure; instructed to contact VAD coordinator before any dental procedures (including routine cleaning)   Discharge binder given to patient and include the following:  1. Discharge instructions sheet 2. List of emergency contacts 3. Wallet card 4. HM II Luggage tags 5. Guide to Percutaneous Lead Care and Equipment Maintenance 6. HM II Alarms for Patients and their Caregivers 7. HM II Patient Handbook 8. HM II Patient Education Program DVD 9. HM II Information and Emergency Assistance Guide 10. MPU Reference DVD 11. Daily diary sheets 12. [place] VAD Patient Education Booklet  Discharge equipment includes:  1. Two system controllers with preset: Fixed speed of 9400 RPM Low speed limit 8800 RPM 2. One MPU with 57' patient cable 3. One UBC 4. Eight fully charged batteries  5. Four battery clips 6. One travel case 7. One holster vest 8. Wearable accessory package 9. Shower bags (2) 10. 20 dressings and 5  anchors  Following notification process completed with:  [facility] [employer]  Dr. Dr. Donzetta Sprung , MD, PCP   Discussed frequency and importance of INR checks; emphasized importance of maintaining INR goal to prevent clotting and or bleeding issues with pump.  She has been on coumadin in the past and is familiar with management. Able to answer questions and asked good questions pertaining to warfarin and diet/lifestyle changes necessary to be successful and safe.  Patient will have INR managed with the heart failure clinic and current INR goal is 2.0 - 2.5 and is D/C home on 81 mg ASA.   Based on information received from [facility] II manufacturer ([employer]) about reported difficulties that have occurred with some patients and caregivers when changing from primary to back up pocket controller, the HM II LVAD addendum document [id number] was given and reviewed with pt and caregiver.  Additional instructions were provided as follows:         Patient should have a caregiver present during system controller exchange or call 911 and VAD pager before attempting to change controller if alone.  As part of the weekly safety checklist, the patient and caregiver should review and understand the steps and instructions involved with replacing the system controller.   As part of monthly safety checklist, the patient and caregiver should review and understand  the system controller alarms and how to resolve them.   Updated copy of HM II Guide to Replacing the Running System Controller with the Backup Controller for Patients and Their Caregiveriven reviewed and given to the patient.  HM II Home Flowsheets given that include the weekly and monthly checks as noted above.  The patient and his wife has completed a proficiency test for the HM II and all questions have been answered. The pt and family have been instructed to call if any questions, problems, or concerns arise. Pt and  caregiver successfully paged VAD coordinator using VAD pager emergency number and have been instructed to use this number only for emergencies.  Patient and caregiver(s) asked appropriate questions, had good interaction with VAD coordinator, and verbalized understanding of above instructions.   Patient is being discharged to [facility]. D/W Melissa (admissions coordinator) that Mr. Keats will need strong reinforcement during therapy sessions as he is very weak and his dexterity is poor to be independent on equipment. He was unable to perform controller change out due to the above however his wife did very well. They passed the post-education test together however needed prompting. Repeat session tomorrow to educate daughters Victorino Dike and Bjorn Loser in addition to review with Carney Bern and [name].   Total elapsed time:  4 hours   [name], RN VAD Coordinator   Office: [phone] 24/7 Emergency VAD Pager: [phone]

## 2015-09-19 NOTE — Care Management Important Message (Signed)
Important Message  Patient Details  Name: [name] MRN: [id number] Date of Birth: 29-Jun-1940   Medicare Important Message Given:  Yes    Kyla Balzarine 09/19/2015, 1:24 PM

## 2015-09-19 NOTE — PMR Pre-admission (Signed)
PMR Admission Coordinator Pre-Admission Assessment  Patient: [name] is an 75 y.o., male MRN: [id number] DOB: [birth year] Height: _0  (188 cm) Weight: 79.3 kg (174 lb 13.2 oz)              Insurance Information HMO:     PPO:      PCP:      IPA:      80/20:      OTHER:  PRIMARY: Medicare A and B      Policy#: [id number]      Subscriber: Self CM Name:       Phone#:      Fax#:  Pre-Cert#:       Employer: Full time  Benefits:  Phone #:      Name:  Eff. Date: 10/08/06     Deduct: $1,316      Out of Pocket Max: none      Life Max: none CIR: 100%      SNF: 100% days 1-20, 80% days 21-100 Outpatient: PT/OT/SLP     Co-Pay: 80% Home Health: PT/OT/SLP      Co-Pay: none DME: 80%     Co-Pay: 20% Providers: patient's choice   SECONDARY: BCBS Supplement       Policy#: [id number]      Subscriber: self CM Name:       Phone#:      Fax#:  Pre-Cert#:       Employer: full time Benefits:  Phone #:  [phone]    Name:  Eff. Date:      Deduct:       Out of Pocket Max:       Life Max:  CIR:       SNF:  Outpatient:      Co-Pay:  Home Health:       Co-Pay:  DME:      Co-Pay:   Medicaid Application Date:       Case Manager:  Disability Application Date:       Case Worker:   Emergency Contact Information Contact Information    Name Relation Home Work Mobile   Arnett Spouse [phone]  [phone]   Evans,Rhonda Daughter [phone]  [phone]   Martin,Jennifer    [phone]     Current Medical History  Patient Admitting Diagnosis: Deconditioning related to acute on chronic systolic congestive heart failure  History of Present Illness: [name] is a 75 y.o. right handed male with history of cardiomyopathy and left bundle branch block. Per chart review patient is married and independent prior to admission. 2 level home with bedroom on main floor. 3 steps to entry. Family can assist as needed. Presented 08/21/2015 through the heart failure clinic with  dizziness and low volume output. 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 Found to be in cardiogenic shock. Workup per cardiology services and cardiothoracic surgery patient underwent LVAD 08/30/2015 per Dr. Lucianne Lei trigt. He was extubated 08/31/2015. Received chest tube placement 09/12/2015 for left pleural effusion drained 1650 mL initially. Bouts of confusion with therapies initiated. Placed on Remeron and Celexa for mood stabilization. Patient presently maintained on aspirin and Coumadin therapy. Acute on chronic anemia 8.5 and monitored. Physical and occupational therapy evaluations completed and ongoing with recommendations of physical medicine rehabilitation consult. Patient was admitted for a comprehensive rehabilitation program.      Past Medical History  Past Medical History  Diagnosis Date  . Nephrolithiasis   . GERD (gastroesophageal reflux disease)   . Insomnia   .  Nonischemic cardiomyopathy ([place])     Cardiac catheterization 12/2011 with LVEF 45%, no significant obstructive CAD  . Automatic implantable cardioverter-defibrillator in situ   . H/O hiatal hernia   . Osteoarthritis   . Pneumonia     Dec 2016    Family History  family history includes Asthma in his mother; Cancer - Ovarian in his sister; Cancer - Prostate in his maternal uncle; Congestive Heart Failure in his brother and brother; Heart disease in his father; Heart failure in his father, mother, and sister; Stroke in his mother.  Prior Rehab/Hospitalizations:  Has the patient had major surgery during 100 days prior to admission? No  Current Medications   Current facility-administered medications:  .  0.45 % sodium chloride infusion, , Intravenous, Continuous PRN, Ivin Poot, MD, Stopped at 08/31/15 1700 .  0.9 %  sodium chloride infusion, 250 mL, Intravenous, Continuous, Ivin Poot, MD, Last Rate: 1 mL/hr at 08/31/15 0540, 250 mL at 08/31/15 0540 .  0.9 %  sodium  chloride infusion, , Intravenous, Continuous, Ivin Poot, MD, Last Rate: 10 mL/hr at 09/08/15 1609, 10 mL/hr at 09/08/15 1609 .  0.9 %  sodium chloride infusion, , Intravenous, Continuous, Ivin Poot, MD, Last Rate: 10 mL/hr at 09/14/15 2000 .  acetaminophen (TYLENOL) tablet 650 mg, 650 mg, Oral, Q4H PRN, Amy D Clegg, NP, 650 mg at 09/19/15 0934 .  amiodarone (PACERONE) tablet 200 mg, 200 mg, Oral, BID, Jolaine Artist, MD, 200 mg at 09/19/15 1041 .  antiseptic oral rinse (BIOTENE) solution 15 mL, 15 mL, Topical, PRN, Melton Alar, PA-C .  aspirin EC tablet 81 mg, 81 mg, Oral, Daily, 81 mg at 09/19/15 1041 **OR** [DISCONTINUED] aspirin chewable tablet 324 mg, 324 mg, Per Tube, Daily, 324 mg at 09/01/15 1006 **OR** [DISCONTINUED] aspirin suppository 300 mg, 300 mg, Rectal, Daily, Ivin Poot, MD .  bisacodyl (DULCOLAX) EC tablet 10 mg, 10 mg, Oral, Daily, 10 mg at 09/19/15 1041 **OR** bisacodyl (DULCOLAX) suppository 10 mg, 10 mg, Rectal, Daily, Ivin Poot, MD .  citalopram (CELEXA) tablet 10 mg, 10 mg, Oral, Daily, Jolaine Artist, MD, 10 mg at 09/19/15 1041 .  docusate sodium (COLACE) capsule 100 mg, 100 mg, Oral, BID PRN, Ivin Poot, MD, 100 mg at 09/17/15 1127 .  feeding supplement (BOOST / RESOURCE BREEZE) liquid 1 Container, 1 Container, Oral, TID BM, Ivin Poot, MD, 1 Container at 09/18/15 2159 .  fentaNYL (SUBLIMAZE) injection 50 mcg, 50 mcg, Intravenous, Q1H PRN, Ivin Poot, MD, 25 mcg at 09/12/15 2145 .  ferrous [id number] C-folic acid (TRINSICON / FOLTRIN) capsule 1 capsule, 1 capsule, Oral, TID PC, Ivin Poot, MD, 1 capsule at 09/19/15 1040 .  furosemide (LASIX) tablet 40 mg, 40 mg, Oral, Daily, Amy D Clegg, NP .  lactated ringers infusion, , Intravenous, Continuous, Ivin Poot, MD, Stopped at 08/30/15 1500 .  lactated ringers infusion, , Intravenous, Continuous, Ivin Poot, MD, Stopped at 09/02/15 1200 .  mirtazapine  (REMERON) tablet 15 mg, 15 mg, Oral, QHS, Ivin Poot, MD, 15 mg at 09/18/15 2159 .  OLANZapine (ZYPREXA) tablet 5 mg, 5 mg, Oral, QHS, Marianne L York, PA-C, 5 mg at 09/18/15 2200 .  ondansetron (ZOFRAN) injection 4 mg, 4 mg, Intravenous, Q6H PRN, Ivin Poot, MD, 4 mg at 09/01/15 1257 .  pantoprazole (PROTONIX) EC tablet 40 mg, 40 mg, Oral, Daily, Ivin Poot, MD, 40 mg at 09/19/15 1041 .  polyvinyl alcohol (LIQUIFILM TEARS) 1.4 % ophthalmic solution 1 drop, 1 drop, Both Eyes, QID PRN, Bobby Rumpf York, PA-C .  sodium chloride flush (NS) 0.9 % injection 10-40 mL, 10-40 mL, Intracatheter, Q12H, Ivin Poot, MD, 10 mL at 09/16/15 1024 .  sodium chloride flush (NS) 0.9 % injection 10-40 mL, 10-40 mL, Intracatheter, PRN, Ivin Poot, MD, 20 mL at 09/19/15 0453 .  sodium chloride flush (NS) 0.9 % injection 3 mL, 3 mL, Intravenous, Q12H, Ivin Poot, MD, 3 mL at 09/14/15 0926 .  sodium chloride flush (NS) 0.9 % injection 3 mL, 3 mL, Intravenous, PRN, Ivin Poot, MD .  spironolactone (ALDACTONE) tablet 25 mg, 25 mg, Oral, Daily, Amy D Clegg, NP, 25 mg at 09/19/15 1041 .  tamsulosin (FLOMAX) capsule 0.4 mg, 0.4 mg, Oral, Daily, Jolaine Artist, MD, 0.4 mg at 09/19/15 1041 .  traMADol (ULTRAM) tablet 50-100 mg, 50-100 mg, Oral, Q4H PRN, Ivin Poot, MD, 100 mg at 09/13/15 1341 .  warfarin (COUMADIN) tablet 4 mg, 4 mg, Oral, ONCE-1800, Lyndee Leo, RPH .  Warfarin - Pharmacist Dosing Inpatient, , Does not apply, q1800, Ivin Poot, MD  Patients Current Diet: Diet regular Room service appropriate?: Yes; Fluid consistency:: Thin Diet - low sodium heart healthy  Precautions / Restrictions Precautions Precautions: Sternal, Other (comment) Precaution Comments: LVAD  Restrictions Weight Bearing Restrictions: Yes Other Position/Activity Restrictions: sternal precautions   Has the patient had 2 or more falls or a fall with injury in the past year?No  Prior  Activity Level Community (5-7x/wk): Prior to admission patient was workign daily in Environmental health practitioner, Loss adjuster, chartered, and supervising installing of [employer].  He was completely independent, met friends for breakfast daily, and also enjoyed doing yard work.    Home Assistive Devices / Equipment Home Assistive Devices/Equipment: None Home Equipment: Shower seat - built in, Other (comment)  Prior Device Use: Indicate devices/aids used by the patient prior to current illness, exacerbation or injury? 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  Was beginning to feel SOB after ambulating ~300-400 ft.  Self Care: Did the patient need help bathing, dressing, using the toilet or eating?  Independent  Indoor Mobility: Did the patient need assistance with walking from room to room (with or without device)? Independent  Stairs: Did the patient need assistance with internal or external stairs (with or without device)? Independent  Functional Cognition: Did the patient need help planning regular tasks such as shopping or remembering to take medications? Independent  Current Functional Level Cognition  Overall Cognitive Status: Impaired/Different from baseline Current Attention Level: Sustained Orientation Level: Oriented to person, Oriented to place Following Commands: Follows one step commands consistently Safety/Judgement: Decreased awareness of safety, Decreased awareness of deficits General Comments: motor plannind deficits also noted     Extremity Assessment (includes Sensation/Coordination)  Upper Extremity Assessment: Generalized weakness  Lower Extremity Assessment: Generalized weakness    ADLs  Overall ADL's : Needs assistance/impaired Eating/Feeding: Moderate assistance Grooming: Wash/dry face, Oral care, Min guard,  Standing Grooming Details (indicate cue type and reason): Pt brushed teeth and washed face. Needed VCs for finding toothpaste top and putting it back on toothpaste. Also was going to wash is face when a very dripping washcloth that he had not rung out--VCs to the need to wring it out first so as to  not get water all over the place Upper Body Bathing: Minimal assitance, Sitting Lower Body Bathing: Moderate assistance, Sit to/from stand Upper Body Dressing : Moderate assistance, Sitting Lower Body Dressing: Moderate assistance, Sit to/from stand Toilet Transfer: Minimal assistance, +2 for physical assistance Toilet Transfer Details (indicate cue type and reason): simulated sit<>stand from recliner Toileting- Clothing Manipulation and Hygiene: Moderate assistance, Sit to/from stand, +2 for safety/equipment Functional mobility during ADLs: Minimal assistance, +2 for physical assistance, Rolling walker General ADL Comments: Max verbal and tactile cues to get pt to scoot hips forward/backward in chair. Pt not wanting to use his L hand becuase  he "doesn't want to pull anything loose". Pt appeared confused as to the power source he was connected to. wife also though pt was on battery source when he was connected to wall power. Educated wife that pt needed to be hooked up to his main power source if there was a chance he could fall asleep. Pt then began discussing how someone was "delivering" something and also thought it was "June". wife then asked why pt hadn't learned how to switch power sources himself.     Mobility  Overal bed mobility: Needs Assistance, +2 for physical assistance Bed Mobility: Sit to Supine Supine to sit: Mod assist, HOB elevated Sit to supine: Max assist Sit to sidelying: Mod assist General bed mobility comments: Assist to elevate trunk into sitting    Transfers  Overall transfer level: Needs assistance Equipment used: 2 person hand held assist Transfer via Lift Equipment:  Stedy Transfers: Sit to/from Stand Sit to Stand: Min assist Stand pivot transfers: Min assist, +2 physical assistance General transfer comment: Assist to bring hips up and verbal cues to have pt place hands on knees. More assist from low chair than elevated bed    Ambulation / Gait / Stairs / Wheelchair Mobility  Ambulation/Gait Ambulation/Gait assistance: Min guard Ambulation Distance (Feet): 550 Feet Assistive device: 4-wheeled walker Gait Pattern/deviations: Step-through pattern, Decreased stride length, Trunk flexed General Gait Details: Pt handling rollator without difficulty. Verbal cues to stand more erect and look up. Occasional cues to slow pace. Gait velocity: Too fast for his abilities at times. Gait velocity interpretation: at or above normal speed for age/gender    Posture / Balance Dynamic Sitting Balance Sitting balance - Comments: Sat EOB x 5 minutes with supervision Balance Overall balance assessment: Needs assistance Sitting-balance support: No upper extremity supported Sitting balance-Leahy Scale: Fair Sitting balance - Comments: Sat EOB x 5 minutes with supervision Postural control: Posterior lean Standing balance support: Bilateral upper extremity supported Standing balance-Leahy Scale: Poor Standing balance comment: UE support High level balance activites: Backward walking, Direction changes, Turns, Sudden stops High Level Balance Comments: Instability and min assist provided with backward walking, direction changes and turns.  Standardized Balance Assessment Standardized Balance Assessment : Dynamic Gait Index Dynamic Gait Index Level Surface: Mild Impairment (dec gait speed) Change in Gait Speed: Normal Gait with Horizontal Head Turns: Mild Impairment Gait with Vertical Head Turns: Mild Impairment Step Over Obstacle: Severe Impairment    Special needs/care consideration BiPAP/CPAP: No CPM: No Continuous Drip IV: No Dialysis: No         Life Vest: No  but patient has a LVAD  Oxygen: No Special Bed: No Trach Size: no Wound Vac (area): No    Skin: Skin tears on right and left arms  Bowel mgmt: 09/11/15 Bladder mgmt: condom cath  Diabetic mgmt: No     Previous Home Environment Living Arrangements: Spouse/significant other Available Help at Discharge: Family, Available 24 hours/day Type of Home: House Home Layout: Two level, Able to live on main level with bedroom/bathroom Home Access: Stairs to enter Entrance Stairs-Rails: Can reach both, Left, Right Entrance Stairs-Number of Steps: 3 Bathroom Shower/Tub: Walk-in [place]: No Additional Comments: Pt has almost every home equipment imaginable as 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  Discharge Living Setting Plans for Discharge Living Setting: Patient's home, House Type of Home at Discharge: House Discharge Home Layout: Two level, Able to live on main level with bedroom/bathroom Alternate Level Stairs-Rails: Left Alternate Level Stairs-Number of Steps: 14 Discharge Home Access: Stairs to enter Entrance Stairs-Rails: Left Entrance Stairs-Number of Steps: 4 from garage with left rail; 2 from back with no rails  Discharge Bathroom Shower/Tub: Walk-in shower, Door (benches) Discharge Bathroom Toilet: Handicapped height Discharge Bathroom Accessibility: Yes How Accessible: Accessible via walker Does the patient have any problems obtaining your medications?: No  Social/Family/Support Systems Patient Roles: Spouse, Parent Anticipated Caregiver: Len Blalock [phone] cell Anticipated Caregiver's Contact Information: [phone] home  Ability/Limitations of Caregiver: Wife can only provide Sueprvision assist  Caregiver Availability: 24/7 Discharge Plan Discussed with Primary Caregiver: Yes Is Caregiver In Agreement with Plan?: Yes Does Caregiver/Family have Issues with Lodging/Transportation while Pt is in Rehab?: No  Goals/Additional  Needs Patient/Family Goal for Rehab: PT/OT Supervision  Expected length of stay: 5-7 days  Cultural Considerations: None Dietary Needs: None Equipment Needs: TBD Special Service Needs: on going LVAD teaching  Additional Information: Family has been and will need to continue to provide 24/7 Supervision due to confusion to ensure that he does not pull at his LVAD drive line  Pt/Family Agrees to Admission and willing to participate: Yes Program Orientation Provided & Reviewed with Pt/Caregiver Including Roles  & Responsibilities: Yes Additional Information Needs: Contact the LVAD team for questions as needed  Information Needs to be Provided By: LVAD team as a resource   Decrease burden of Care through IP rehab admission: No  Possible need for SNF placement upon discharge: Not anticipated   Patient Condition: This patient's medical and functional status has changed since the consult dated 09/17/15 in which the Rehabilitation Physician determined and documented that the patient was potentially appropriate for intensive rehabilitative care in an inpatient rehabilitation facility. Issues have been addressed and update has been discussed with Dr. Letta Pate  and patient now appropriate for inpatient rehabilitation. Will admit to inpatient rehab today.   Preadmission Screen Completed By:  Gunnar Fusi, 09/19/2015 2:37 PM ______________________________________________________________________   Discussed status with Dr. Letta Pate on 09/19/15 at 1445 and received telephone approval for admission today.  Admission Coordinator:  Gunnar Fusi, time 1445/Date 09/19/15

## 2015-09-19 NOTE — Progress Notes (Signed)
Discussed sternal precautions, mobility, IS, ex gl and CRPII with pt and wife. Pt slightly better cognitively today, able to answer questions about LVAD appropriately. Will send referral to [facility] for in 2 months or so. [id number] Ethelda Chick CES, ACSM 1:45 PM 09/19/2015

## 2015-09-19 NOTE — Discharge Instructions (Signed)

## 2015-09-19 NOTE — Discharge Summary (Signed)
Advanced Heart Failure Team  Discharge Summary   Patient ID: [name] MRN: [id number], DOB/AGE: [birth year] 75 y.o. Admit date: 08/21/2015 D/C date:     09/19/2015   Primary Discharge Diagnoses:  1. 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 EF 15%. Cath 8/14 with normal cors. 2. S/PHM II LVAD placed 08/30/15 for destination therapy.      LVAD speed set at 9400. Aspirin 81 mg daily Coumadin with INR goal 2-2.5 2. Frequent PVCs 3. Severe Malnutrition- Prealbumin 14.7 on 6/20 4. Acute post-op delirium/sundowning- resolving on celexa and zyprexa 5. Urinary Retention- on flomax  6. Left pleural effusion s/p chest tube  Hospital Course:  Mr. Juncaj is a pleasant 75 yo male with a h/o familial cardiomyopathy and LBBB, CRT-D medtronic, PVCs, and chronic systolic heart failure. He has been followed closely in the HF clinic for advanced therapies and consideration for LVAD for DT.   Admitted from HF clinic with increased fatigue and dyspnea on exertion. PICC line was placed for CVP and CO-OX.  On June 15th he underwent  RHC/LHC with swan left in to guide therapy. CO-OX was low and CVP was elevated so he was started on milrinone and IV lasix. Due to hypotension with SBP <80 norepinephrine was added.   Cardiac Surgery and VAD team consulted for VAD consideration. He was presented to [facility] and deemed appropriate HMII for destination therapy. On June 23rd he underwent HMII LVAD for DT. He was extubated on post op day 1. All drips gradually weaned off as he improved.  Post operative course was complicated by confusion. With the addition of celexa and zyprexa he mentally started to improve and was able to participate in therapy.   He will continue on 81 mg aspirin and coumadin with INR goal 2-2.5. Hemoglobin was 8.5 on the day of discharge. Plan to follow daily INR, CBC, BMET, and LDH.   Due to severe deconditioning PT/OT recommended CIR. Today he is being discharged  to CIR in stable condition. Family members plan to stay with him while on rehab. His wife received LVAD education and will continue to follow on CIR. The VAD team will continue to follow daily on CIR.   1. Acute on chronic systolic HF -> cardiogenic shock Admitted with volume overload and cardiogenic shock requiring milrinone and norepi prior to LVAD implant. Diuresed with IV lasix prior to and after surgery. As his volume status improved he transitioned to po lasix.  2. S/P HM II LVAD placed 08/30/15 for destination therapy  Ramp ECHO was performed on 7/10 with speed adjusted to 9400. Continue aspirin 81 mg daily. Continue coumadin to maintain INR 2-2.5.  3. Frequent PVCs  4. Severe Malnutrition- Prealbumin 14.7 on 6/20 Dietitian consulted required short term tube feeds prior to surgery. Also had supplements provided after surgery.  5. Acute post-op delirium/sundowning Post operatively he was confused and required 24 hour sitters. With the addition of celexa and zyprexa he improved. Family was present almost 24 hours a day will be present on rehab as well. At the time of discharge he was cooperative and conversant. He was confused about the month.  6. Urinary Retention requiring Foley Resolved. Foley was removed 7/12. Bladder scan completed with no further retention noted.  7. Left pleural effusion - On 09/12/15 had chest tube placed with 1700 cc removed.    Flow: 5.1 L/min Speed: 9400 Pump flow 5.1 Power 6 PI 4.9. Occasional PI events. On 7/9 had Power Spike  10.4.  Procedures 1- Left Chest Tube 09/13/2015  2- Ramp ECHO 09/16/2015 LVAD Speed 9400 3- S/P HMII LVAD 08/30/2015  4. RHC/LHC 08/22/15 Mid LAD to Dist LAD lesion, 20% stenosed RA = 10 RV = 54/8 PA = 62/24 (33) PCW = 28 Fick cardiac output/index = 4.0/2.0 Thermo CO/CO = 4.6/2.3 PVR = 1.2 WU SVR = 1240 Ao sat = 97% PA sat = 53%, 54%        Discharge Weight: 174 pounds Discharge Vitals: Blood pressure 84/72, pulse  60, temperature 97.6 F (36.4 C), temperature source Oral, resp. rate 18, height 6\' 2"  (1.88 m), weight 174 lb 13.2 oz (79.3 kg), SpO2 97 %.  Labs: Lab Results  Component Value Date   WBC 4.1 09/19/2015   HGB 8.5* 09/19/2015   HCT 28.5* 09/19/2015   MCV 86.1 09/19/2015   PLT 176 09/19/2015     Recent Labs Lab 09/19/15 0453  NA 139  K 4.1  CL 107  CO2 27  BUN 9  CREATININE 0.79  CALCIUM 8.4*  GLUCOSE 85   Lab Results  Component Value Date   CHOL 101 08/21/2015   HDL 28* 08/21/2015   LDLCALC 64 08/21/2015   TRIG 44 08/21/2015   BNP (last 3 results)  Recent Labs  05/13/15 2248 08/21/15 1345 08/31/15 0400  BNP 1235.6* 2132.9* 1387.0*    ProBNP (last 3 results) No results for input(s): PROBNP in the last 8760 hours.   Diagnostic Studies/Procedures   Dg Chest 2 View  09/18/2015  CLINICAL DATA:  Follow-up positioning of left ventricular assist device placed on June 23rd; no current chest complaints. EXAM: CHEST  2 VIEW COMPARISON:  Portable chest x-ray of September 16, 2015 FINDINGS: The left ventricular assist device appears to be in stable position positioned over the cardiac apex. The cardiac silhouette remains mildly enlarged. The pulmonary vascularity is normal. There is noalveolar infiltrate. Small pleural effusions layer posteriorly, bilaterally. The permanent pacemaker defibrillator is in stable position. The sternal wires are intact. The PICC line tip projects over the midportion of the SVC. There is calcification in the wall of the aortic arch. The observed bony structures exhibit no acute abnormalities. IMPRESSION: Stable appearance of the left ventricular assist device. Stable mild cardiomegaly without pulmonary vascular congestion or pulmonary edema. Bilateral small posterior pleural effusions. Aortic atherosclerosis. Electronically Signed   By: David  [place] M.D.   On: 09/18/2015 09:01    Discharge Medications     Medication List    STOP taking these  medications        Cod Liver Oil 1000 MG Caps     digoxin 0.125 MG tablet  Commonly known as:  LANOXIN     escitalopram 10 MG tablet  Commonly known as:  LEXAPRO     ivabradine 5 MG Tabs tablet  Commonly known as:  CORLANOR     METAMUCIL PO     potassium chloride SA 20 MEQ tablet  Commonly known as:  K-DUR,KLOR-CON     prednisoLONE acetate 1 % ophthalmic suspension  Commonly known as:  PRED FORTE     vitamin A 8000 UNIT capsule     vitamin B-12 100 MCG tablet  Commonly known as:  CYANOCOBALAMIN     vitamin C 500 MG tablet  Commonly known as:  ASCORBIC ACID     zolpidem 10 MG tablet  Commonly known as:  AMBIEN      TAKE these medications        acetaminophen 325 MG tablet  Commonly known as:  TYLENOL  Take 2 tablets (650 mg total) by mouth every 4 (four) hours as needed for mild pain, moderate pain or fever.     amiodarone 200 MG tablet  Commonly known as:  PACERONE  Take 1 tablet (200 mg total) by mouth 2 (two) times daily.     aspirin 81 MG EC tablet  Take 1 tablet (81 mg total) by mouth daily.     bisacodyl 5 MG EC tablet  Commonly known as:  DULCOLAX  Take 2 tablets (10 mg total) by mouth daily.     CALTRATE 600+D PO  Take 1 tablet by mouth daily.     citalopram 10 MG tablet  Commonly known as:  CELEXA  Take 1 tablet (10 mg total) by mouth daily.     docusate sodium 100 MG capsule  Commonly known as:  COLACE  Take 1 capsule (100 mg total) by mouth 2 (two) times daily as needed for mild constipation.     feeding supplement Liqd  Take 1 Container by mouth 3 (three) times daily between meals.     ferrous fumarate-b12-vitamic C-folic acid capsule  Commonly known as:  TRINSICON / FOLTRIN  Take 1 capsule by mouth 3 (three) times daily after meals.     furosemide 40 MG tablet  Commonly known as:  LASIX  Take 1 tablet (40 mg total) by mouth daily.     Glucosamine Sulfate 1000 MG Caps  Take 1,000 mg by mouth daily.     ketorolac 0.5 % ophthalmic  solution  Commonly known as:  ACULAR  Place 1 drop into the left eye 2 (two) times daily.     mirtazapine 15 MG tablet  Commonly known as:  REMERON  Take 1 tablet (15 mg total) by mouth at bedtime.     multivitamin tablet  Take 1 tablet by mouth daily.     pantoprazole 40 MG tablet  Commonly known as:  PROTONIX  Take 40 mg by mouth daily.     polyvinyl alcohol 1.4 % ophthalmic solution  Commonly known as:  LIQUIFILM TEARS  Place 1 drop into both eyes 4 (four) times daily as needed for dry eyes.     spironolactone 25 MG tablet  Commonly known as:  ALDACTONE  Take 1 tablet (25 mg total) by mouth daily.     tamsulosin 0.4 MG Caps capsule  Commonly known as:  FLOMAX  Take 1 capsule (0.4 mg total) by mouth daily.     THERATEARS OP  Apply 1 drop to eye 3 (three) times daily as needed (dry eyes).     traMADol 50 MG tablet  Commonly known as:  ULTRAM  Take 1-2 tablets (50-100 mg total) by mouth every 4 (four) hours as needed for moderate pain.     warfarin 4 MG tablet  Commonly known as:  COUMADIN  Take 1 tablet (4 mg total) by mouth one time only at 6 PM.        Disposition   The patient will be discharged in stable condition to home. Discharge Instructions    Amb Referral to Cardiac Rehabilitation    Complete by:  As directed   Diagnosis:  Heart Failure (see criteria below if ordering Phase II)  Heart Failure Type:  Chronic Systolic & Diastolic     Care order/instruction    Complete by:  As directed   VAD set speed 9400 VAD low speed set 8800     Diet - low sodium  heart healthy    Complete by:  As directed      Heart Failure patients record your daily weight using the same scale at the same time of day    Complete by:  As directed      Increase activity slowly    Complete by:  As directed               Duration of Discharge Encounter: Greater than 35 minutes   Signed, Avrian Delfavero NP-C  09/19/2015, 2:32 PM

## 2015-09-19 NOTE — Progress Notes (Signed)
ANTICOAGULATION CONSULT NOTE - Follow Up Consult  Pharmacy Consult for Coumadin Indication: LVAD   Labs:  Recent Labs  09/17/15 0425 09/18/15 0450 09/18/15 1204 09/19/15 0453  HGB 8.3*  --  8.4* 8.5*  HCT 28.3*  --  28.3* 28.5*  PLT 190  --  178 176  LABPROT 21.0* 22.1*  --  23.5*  INR 1.81* 1.95*  --  2.12*  CREATININE 0.77  --  0.73 0.79    Estimated Creatinine Clearance: 92.2 mL/min (by C-G formula based on Cr of 0.79).  Assessment: 74yom s/p LVAD 6/23 continues on coumadin (started low dose per MD on 6/24, pharmacy dosing starting 7/3). INR trended up to 3.38 and doses held 7/4 and 7/5.   INR now trending up and is at goal this am 2.1. Continues on po amiodarone.  LDH stable 196  Goal of Therapy:  INR 2-2.5 Monitor platelets by anticoagulation protocol: Yes   Plan:  1) Coumadin 4mg  tonight  2) Daily INR  Sheppard Coil PharmD., BCPS Clinical Pharmacist Pager [phone] 09/19/2015 11:30 AM

## 2015-09-19 NOTE — Progress Notes (Addendum)
Inpatient Rehabilitation  Following IP Rehab consult chart was reviewed and noted that despite gait distance patient is requiring Mod assist for transfers, cues to problem solve basic self-care tasks, as well as ongoing LVAD education.  Therefore, discussed case with today's admitting MD, Dr. Letta Pate who was in agreement with need for IP Rehab.  Met with patient and wife at bedside to share booklets and answer questions, who are in agreement with plan.  Following completion of LVAD education later today plan to proceed with IP Rehab admission today.    Please call with questions.   [name]., CCC/SLP Admission Coordinator  [place]  Cell [phone]

## 2015-09-20 ENCOUNTER — Inpatient Hospital Stay (HOSPITAL_COMMUNITY): Payer: Medicare Other | Admitting: Occupational Therapy

## 2015-09-20 ENCOUNTER — Inpatient Hospital Stay (HOSPITAL_COMMUNITY): Payer: Medicare Other | Admitting: Speech Pathology

## 2015-09-20 ENCOUNTER — Inpatient Hospital Stay (HOSPITAL_COMMUNITY): Payer: Medicare Other | Admitting: Physical Therapy

## 2015-09-20 DIAGNOSIS — R5381 Other malaise: Secondary | ICD-10-CM

## 2015-09-20 DIAGNOSIS — Z95811 Presence of heart assist device: Secondary | ICD-10-CM

## 2015-09-20 LAB — COMPREHENSIVE METABOLIC PANEL
ALK PHOS: 67 U/L (ref 38–126)
ALT: 22 U/L (ref 17–63)
ANION GAP: 7 (ref 5–15)
AST: 19 U/L (ref 15–41)
Albumin: 2.5 g/dL — ABNORMAL LOW (ref 3.5–5.0)
BILIRUBIN TOTAL: 0.8 mg/dL (ref 0.3–1.2)
BUN: 9 mg/dL (ref 6–20)
CALCIUM: 8.6 mg/dL — AB (ref 8.9–10.3)
CO2: 28 mmol/L (ref 22–32)
Chloride: 103 mmol/L (ref 101–111)
Creatinine, Ser: 0.86 mg/dL (ref 0.61–1.24)
Glucose, Bld: 95 mg/dL (ref 65–99)
POTASSIUM: 3.8 mmol/L (ref 3.5–5.1)
Sodium: 138 mmol/L (ref 135–145)
TOTAL PROTEIN: 5.5 g/dL — AB (ref 6.5–8.1)

## 2015-09-20 LAB — CBC WITH DIFFERENTIAL/PLATELET
BASOS PCT: 1 %
Basophils Absolute: 0 10*3/uL (ref 0.0–0.1)
Eosinophils Absolute: 0.5 10*3/uL (ref 0.0–0.7)
Eosinophils Relative: 9 %
HEMATOCRIT: 29.6 % — AB (ref 39.0–52.0)
HEMOGLOBIN: 9 g/dL — AB (ref 13.0–17.0)
LYMPHS ABS: 0.9 10*3/uL (ref 0.7–4.0)
Lymphocytes Relative: 18 %
MCH: 26.2 pg (ref 26.0–34.0)
MCHC: 30.4 g/dL (ref 30.0–36.0)
MCV: 86 fL (ref 78.0–100.0)
MONO ABS: 0.3 10*3/uL (ref 0.1–1.0)
MONOS PCT: 6 %
NEUTROS ABS: 3.4 10*3/uL (ref 1.7–7.7)
Neutrophils Relative %: 66 %
Platelets: 182 10*3/uL (ref 150–400)
RBC: 3.44 MIL/uL — ABNORMAL LOW (ref 4.22–5.81)
RDW: 17.1 % — AB (ref 11.5–15.5)
WBC: 5 10*3/uL (ref 4.0–10.5)

## 2015-09-20 LAB — PROTIME-INR
INR: 2.47 — ABNORMAL HIGH (ref 0.00–1.49)
PROTHROMBIN TIME: 26.4 s — AB (ref 11.6–15.2)

## 2015-09-20 MED ORDER — SODIUM CHLORIDE 0.9% FLUSH
10.0000 mL | Freq: Two times a day (BID) | INTRAVENOUS | Status: DC
  Administered 2015-09-20 – 2015-09-21 (×3): 10 mL
  Administered 2015-09-24: 20 mL
  Administered 2015-09-25 (×2): 10 mL

## 2015-09-20 MED ORDER — WARFARIN SODIUM 2.5 MG PO TABS
2.5000 mg | ORAL_TABLET | Freq: Once | ORAL | Status: AC
  Administered 2015-09-20: 2.5 mg via ORAL
  Filled 2015-09-20: qty 1

## 2015-09-20 MED ORDER — SODIUM CHLORIDE 0.9% FLUSH
10.0000 mL | INTRAVENOUS | Status: DC | PRN
Start: 2015-09-20 — End: 2015-10-02
  Administered 2015-09-20: 10 mL
  Administered 2015-09-22: 20 mL
  Administered 2015-09-23: 10 mL
  Administered 2015-09-26: 20 mL
  Administered 2015-09-27: 10 mL
  Filled 2015-09-20 (×4): qty 40

## 2015-09-20 NOTE — Evaluation (Signed)
Physical Therapy Assessment and Plan  Patient Details  Name: [name] MRN: [id number] Date of Birth: 1940/09/17  PT Diagnosis: Cognitive deficits, Difficulty walking and Muscle weakness Rehab Potential: Good ELOS: 12-14 days   Today's Date: 09/20/2015 PT Individual Time: 0800-0910 PT Individual Time Calculation (min): 70 min    Problem List:  Patient Active Problem List   Diagnosis Date Noted  . Physical deconditioning 09/19/2015  . 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 Debilitated 09/19/2015  . Other congestive heart failure ([place])   . Encephalopathy   . H/O chest tube placement   . LBBB (left bundle branch block)   . Generalized OA   . Prediabetes   . Acute delirium   . LVAD (left ventricular assist device) present ([place])   . Insomnia   . Protein-calorie malnutrition, severe 08/23/2015  . Palliative care encounter   . Goals of care, counseling/discussion   . Acute on chronic systolic (congestive) heart failure ([place]) 08/21/2015  . Acute on chronic systolic CHF (congestive heart failure) ([place]) 08/21/2015  . PVC (premature ventricular contraction) 01/04/2015  . 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 Symptomatic bradycardia 11/28/2012  . Familial cardiomyopathy ([place]) 10/27/2012  . Chronic systolic heart failure ([place]) 10/27/2012  . Secondary cardiomyopathy ([place]) 10/06/2012  . Left bundle branch block 10/06/2012  . Syncope 10/06/2012    Past Medical History:  Past Medical History  Diagnosis Date  . Nephrolithiasis   . GERD (gastroesophageal reflux disease)   . Insomnia   . Nonischemic cardiomyopathy ([place])     Cardiac catheterization 12/2011 with LVEF 45%, no significant obstructive CAD  . Automatic implantable cardioverter-defibrillator in situ   . H/O hiatal hernia   . Osteoarthritis   . Pneumonia     Dec 2016   Past Surgical History:  Past Surgical History  Procedure Laterality Date  . Exploratory laparotomy  2006?  Marland Kitchen Appendectomy    .  Volvulus reduction  2007  . Posterior fusion cervical spine  2001  . Bi-ventricular implantable cardioverter defibrillator  (crt-d)  12/14/2012  . Cardiac catheterization  2003?; ~ 11/2012  . Knee arthroscopy Left 1990's  . Lithotripsy      "twice" (12/14/2012)  . Cystoscopy w/ stone manipulation      "3-4 times" (12/14/2012)  . Bi-ventricular implantable cardioverter defibrillator N/A 12/14/2012    Procedure: BI-VENTRICULAR IMPLANTABLE CARDIOVERTER DEFIBRILLATOR  (CRT-D);  Surgeon: Deboraha Sprang, MD;  Location: [facility] CATH LAB;  Service: Cardiovascular;  Laterality: N/A;  . Cataract extraction w/phaco Left 05/13/2015    Procedure: CATARACT EXTRACTION PHACO AND INTRAOCULAR LENS PLACEMENT LEFT EYE;  CDE:  3.49;  Surgeon: Williams Che, MD;  Location: [facility] ORS;  Service: Ophthalmology;  Laterality: Left;  . Cataract extraction w/phaco Right 07/22/2015    Procedure: CATARACT EXTRACTION PHACO AND INTRAOCULAR LENS PLACEMENT RIGHT EYE CDE=4.50;  Surgeon: Williams Che, MD;  Location: [facility] ORS;  Service: Ophthalmology;  Laterality: Right;  . Cardiac catheterization N/A 08/22/2015    Procedure: Right/Left Heart Cath and Coronary Angiography;  Surgeon: Jolaine Artist, MD;  Location: [facility] CV LAB;  Service: Cardiovascular;  Laterality: N/A;  . Insertion of implantable left ventricular assist device N/A 08/30/2015    Procedure: INSERTION OF IMPLANTABLE LEFT VENTRICULAR ASSIST DEVICE;  Surgeon: Ivin Poot, MD;  Location: [place];  Service: Open Heart Surgery;  Laterality: N/A;  . Intraoperative transesophageal echocardiogram N/A 08/30/2015    Procedure: INTRAOPERATIVE TRANSESOPHAGEAL ECHOCARDIOGRAM;  Surgeon: Ivin Poot, MD;  Location: [facility];  Service:  Open Heart Surgery;  Laterality: N/A;    Assessment & Plan Clinical Impression: Patient is a 75 y.o. right handed male with history of cardiomyopathy and left bundle branch block. Per chart review patient is married and independent prior to  admission. 2 level home with bedroom on main floor. 3 steps to entry. Family can assist as needed. Presented 08/21/2015 through the heart failure clinic with dizziness and low volume output. 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 Found to be in cardiogenic shock. Workup per cardiology services and cardiothoracic surgery patient underwent LVAD 08/30/2015 per Dr. Lucianne Lei trigt. He was extubated 08/31/2015. Received chest tube placement 09/12/2015 for left pleural effusion drained 1650 mL initially. Bouts of confusion with therapies initiated. Placed on Remeron and Celexa for mood stabilization. Patient presently maintained on aspirin and Coumadin therapy. Acute on chronic anemia 8.5 and monitored.  Patient transferred to CIR on 09/19/2015 .   Patient currently requires max with mobility secondary to muscle weakness, decreased cardiorespiratoy endurance, decreased attention, decreased problem solving, decreased safety awareness, decreased memory and delayed processing and decreased standing balance, decreased postural control, decreased balance strategies and difficulty maintaining precautions.  Prior to hospitalization, patient was independent  with mobility and lived with Spouse in a House home.  Home access is 3-4Stairs to enter.  Patient will benefit from skilled PT intervention to maximize safe functional mobility, minimize fall risk and decrease caregiver burden for planned discharge home with 24 hour supervision.  Anticipate patient will benefit from follow up [place] at discharge.  PT - End of Session Activity Tolerance: Tolerates < 10 min activity with changes in vital signs Endurance Deficit: Yes Endurance Deficit Description: requires multiple rest breaks PT Assessment Rehab Potential (ACUTE/IP ONLY): Good PT Patient demonstrates impairments in the following area(s): Balance;Endurance;Motor;Safety PT Transfers Functional Problem(s): Bed Mobility;Bed to  Chair;Car;Furniture PT Locomotion Functional Problem(s): Ambulation;Stairs PT Plan PT Intensity: Minimum of 1-2 x/day ,45 to 90 minutes PT Frequency: 5 out of 7 days PT Duration Estimated Length of Stay: 12-14 days PT Treatment/Interventions: Ambulation/gait training;Discharge planning;Functional mobility training;Psychosocial support;Therapeutic Activities;Balance/vestibular training;Disease management/prevention;Neuromuscular re-education;Therapeutic Exercise;Cognitive remediation/compensation;DME/adaptive equipment instruction;Pain management;UE/LE Strength taining/ROM;Community reintegration;Patient/family education;Stair training PT Transfers Anticipated Outcome(s): Supervision PT Locomotion Anticipated Outcome(s): Supervision PT Recommendation Recommendations for Other Services: Neuropsych consult;Speech consult Follow Up Recommendations: Home health PT;24 hour supervision/assistance Patient destination: Home Equipment Recommended: Wheelchair (measurements);Wheelchair cushion (measurements);Rolling walker with 5" wheels Equipment Details: transport w/c  Skilled Therapeutic Intervention Pt received in bed with wife present assisting pt with breakfast.  While pt finishing breakfast discussed with pt and wife PLOF, home set up, equipment and assistance available at D/C, goals, purpose of PT eval and cognitive issues wife has observed.  Pt's wife performed LVAD management switching pt to battery power and placing extra batteries and clips in black bag to take to therapy; pt currently unable to sequence LVAD management at this time due to impaired cognition.  Pt participated in PT eval with assessment of bed mobility, bed <> chair transfers with pt requiring max lifting and lowering assistance for sit <> stand to shift COG over BOS and mod A for balance while pivoting to w/c while hugging pillow to chest to maintain sternal precautions, gait, stair negotiation as described below.  Following stair  negotiation pt required +2 for sit > stand from w/c due to LE weakness and fatigue and due to inability to push up with UE due to sternal precautions.  At end of session pt left in w/c with family present  for LVAD training.  PT Evaluation Precautions/Restrictions Precautions Precautions: Sternal Precaution Comments: LVAD, Call VAD Pager [phone]. Flow less than 2.5, or sustained increase or decrease of 2.0 L Restrictions Weight Bearing Restrictions: Yes RUE Weight Bearing: Non weight bearing LUE Weight Bearing: Non weight bearing General Chart Reviewed: Yes Response to Previous Treatment: Patient reporting fatigue but able to participate. 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  Was beginning to feel SOB after ambulating ~300-400 ft. Cognition Overall Cognitive Status: Impaired/Different from baseline Arousal/Alertness: Awake/alert Orientation Level: Oriented to person;Oriented to place;Oriented to situation Attention: Sustained Sustained Attention: Appears intact Memory: Impaired Memory Impairment: Decreased short term memory;Retrieval  deficit Awareness: Impaired Problem Solving: Impaired Sensation Sensation Light Touch: Appears Intact Stereognosis: Not tested Hot/Cold: Not tested Proprioception: Appears Intact Coordination Gross Motor Movements are Fluid and Coordinated: Not tested Fine Motor Movements are Fluid and Coordinated: Not tested Motor  Motor Motor: Other (comment) Motor - Skilled Clinical Observations: Generalized weakness  Mobility Bed Mobility Bed Mobility: Supine to Sit;Sit to Supine Supine to Sit: HOB flat;2: Max assist Sit to Supine: HOB flat;2: Max assist Transfers Transfers: Yes Stand to Sit: 2: Max assist Locomotion  Ambulation Ambulation: Yes Ambulation/Gait Assistance: 3: Mod assist Ambulation Distance (Feet): 25 Feet Assistive device: Rolling walker Gait Gait: Yes Gait Pattern: Impaired Gait Pattern: Step-through pattern;Decreased trunk rotation;Trunk flexed Stairs / Additional Locomotion Stairs: Yes Stairs Assistance: 3: Mod assist Stair Management Technique: Two rails;Step to pattern;Forwards;Backwards Number of Stairs: 4 Height of Stairs: 3 Wheelchair Mobility Wheelchair Mobility: No (due to sternal precautions)  Trunk/Postural Assessment  Cervical Assessment Cervical Assessment: Within Functional Limits Thoracic Assessment Thoracic Assessment: Within Functional Limits Lumbar Assessment Lumbar Assessment: Within Functional Limits Postural Control Postural Control: Deficits on evaluation (impaired balance reactions)  Balance Balance Balance Assessed: Yes Static Sitting Balance Static Sitting - Balance Support: Feet supported Static Sitting - Level of Assistance: 5: Stand by assistance Dynamic Sitting Balance Dynamic Sitting - Balance Support: Feet supported Dynamic Sitting - Level of Assistance: 4: Min assist Static Standing Balance Static Standing - Balance Support: Right upper extremity supported;Left upper extremity supported Static Standing - Level of  Assistance: 4: Min assist Dynamic Standing Balance Dynamic Standing - Balance Support: Right upper extremity supported;Left upper extremity supported Dynamic Standing - Level of Assistance: 3: Mod assist Extremity Assessment  RUE Assessment RUE Assessment: Within Functional Limits (strength grossly 4/5, guarded throughout due to sternal precautions) LUE Assessment LUE Assessment: Within Functional Limits (strength grossly 4/5, guarded throughout due to sternal precautions) RLE Assessment RLE Assessment: Exceptions to WFL (3/5) LLE Assessment LLE Assessment: Exceptions to [facility] (4-/5)   See Function Navigator for Current Functional Status.   Refer to Care Plan for Long Term Goals  Recommendations for other services: Neuropsych  Discharge Criteria: Patient will be discharged from PT if patient refuses treatment 3 consecutive times without  medical reason, if treatment goals not met, if there is a change in medical status, if patient makes no progress towards goals or if patient is discharged from hospital.  The above assessment, treatment plan, treatment alternatives and goals were discussed and mutually agreed upon: by patient and by family  Malachy Mood 09/20/2015, 12:56 PM

## 2015-09-20 NOTE — Progress Notes (Signed)
VAD Discharge Teaching Note:  Discharge VAD teaching completed with Bjorn Loser and Jennifer-pts daughters.     The home inspection checklist has been reviewed and no unsafe conditions have been identified. Family reports that there are at least two dedicated grounded, 3-prong outlets with clearly labeled circuit breaker has been established in the bedroom for power module and Magazine features editor.   Both patient and caregivers have been trained on the following:  1. HM II LVAD overview of system operations  2. Overview of major lifestyle accommodations and cautions   3. Overview of system components (features and functions) 4. Changing power sources 5. Overview of alerts and alarms 6. How to identify and manage an emergency including when pump is running and when pump has stopped  7. Changing system controller 8. Maintain emergency contact list and medications  The patient and caregiver(s) have successfully demonstrated:  1. Changing power source (from batteries to MPU, MPU to  batteries, and replacing batteries) 2. Perform system controller self test  3. Perform power module self test  4. Check and charge batteries  5. Change system controller 6. Paged VAD pager and programmed number in phones  A daily flow sheet with patient  weight, temperature,  flow, speed, power, and PI, along with daily self checks on system controller and power module have been performed by patient and caregiver(s) during hospitalization and will also be done daily at home.   The caregiver(s) has been trained on percutaneous lead exit site care, care of the driveline and dressing changes. They have performed dressing changes during patient's hospitalization under my supervision with the support of the nursing staff. The importance of lead immobilization has been stressed to patient and caregiver(s) using the attachment device. The caregiver has successfully demonstrated the following: 1. Cleansing site with sterile  technique 2. Dressing care and maintenance  3. Immobilizing driveline  The following routine activities and maintenance have been reviewed with patient and caregiver(s) and both verbalize understanding:  1. Stressed importance of never disconnecting power from both controller power leads at the same time, and never disconnecting both batteries at the same time, or the pump will stop 2. Plug the Mobile Power Unit (MPU) or the universal Magazine features editor (UBC) into properly grounded (3 prong) outlets dedicated to PM use. Do NOT use adapter (cheater plug) for ungrounded outlets or multiple portable socket outlets (power strips) 3. Do not connect the MPU or UBC to an outlet controlled by wall switch or the device may not work 4. The MPU does not have an internal battery. The AA batteries that are in the unit provide power only to the light speaker to the unit. In the event external power is removed from the MPU the patient's system controller back up battery will provide power to the pump for 15 minutes until batteries can be applied.  5. AA batteries are to be replaced every 6 months to the MPU.  7. Keep a backup system controller, charged batteries, battery clips, and flashlight near you during sleep in case of electrical power outage 8. Clean battery, battery clip, and universal battery charger contacts weekly 9. Visually inspect percutaneous lead daily 10. Check cables and connectors when changing power source  11. Rotate batteries; keep all eight batteries charged 12. Always have backup system controller, battery clips, fully charged batteries, and spare fully charged batteries when traveling 13. Re-calibrate batteries every 70 uses; monitor battery life of 36 months or 360 uses; replace batteries at end of battery life  Identified the following changes in activities of daily living with pump:  1. No driving for at least six weeks and then only if doctor gives permission to do so 2. No tub  baths while pump implanted, and shower only if doctor gives permission 3. No swimming or submersion in water while implanted with pump 4. Keep all VAD equipment away from water or moisture 5. Keep all VAD connections clean and dry 6. No contact sports or engage in jumping activities 7. Avoid strong static electricity (touching TV/computer screens, vacuuming) 8. Never have an MRI while implanted with the pump 9. Never leave or store batteries in extremely hot or cold places (such as   trunk of your car), or the battery life will be shortened 10. Call the doctor or hospital contact person if any change in how the pump sounds, feels, or works 11. Plan to sleep only when connected to the power module. 12. Keep a backup system controller, charged batteries, battery clips, and flashlight near you during sleep in case of electrical power outage 13. Do not sleep on your stomach 14. Talk with doctor before any long distance travel plans 15. Patient will need antibiotics prior to any dental procedure; instructed to contact VAD coordinator before any dental procedures (including routine cleaning)   Discharge binder given to patient and include the following:  1. Discharge instructions sheet 2. List of emergency contacts 3. Wallet card 4. HM II Luggage tags 5. Guide to Percutaneous Lead Care and Equipment Maintenance 6. HM II Alarms for Patients and their Caregivers 7. HM II Patient Handbook 8. HM II Patient Education Program DVD 9. HM II Information and Emergency Assistance Guide 10. MPU Reference DVD 11. Daily diary sheets 12. [place] VAD Patient Education Booklet  Discharge equipment includes:  1. Two system controllers with preset: Fixed speed of 9400 RPM Low speed limit 8800 RPM 2. One MPU with 73' patient cable 3. One UBC 4. Eight fully charged batteries  5. Four battery clips 6. One travel case 7. One holster vest 8. Wearable accessory package 9. Shower bags (2) 10. 20  dressings and 5 anchors  Following notification process completed with:  [facility] [employer]  Dr. Dr. Donzetta Sprung , MD, PCP   Based on information received from [facility] [employer] II manufacturer ([employer]) about reported difficulties that have occurred with some patients and caregivers when changing from primary to back up pocket controller, the HM II LVAD addendum document 161096 was given and reviewed with pt and caregiver.  Additional instructions were provided as follows:         Patient should have a caregiver present during system controller exchange or call 911 and VAD pager before attempting to change controller if alone.  As part of the weekly safety checklist, the patient and caregiver should review and understand the steps and instructions involved with replacing the system controller.   As part of monthly safety checklist, the patient and caregiver should review and understand the system controller alarms and how to resolve them.   Updated copy of HM II Guide to Replacing the Running System Controller with the Backup Controller for Patients and Their Caregiveriven reviewed and given to the patient.  HM II Home Flowsheets given that include the weekly and monthly checks as noted above.  Bjorn Loser and [place] completed a proficiency test for the HM II and all questions have been answered. The pt and family have been instructed to call if any questions, problems,  or concerns arise. Pt and caregiver successfully paged VAD coordinator using VAD pager emergency number and have been instructed to use this number only for emergencies.  Patient and caregiver(s) asked appropriate questions, had good interaction with VAD coordinator, and verbalized understanding of above instructions.   Patient is being discharged to [facility]. D/W Melissa (admissions coordinator) that Mr. Baumgart will need strong reinforcement during therapy sessions as he is very weak and  his dexterity is poor to be independent on equipment. He was unable to perform controller change out due to the above however his wife did very well. They passed the post-education test together with no problems.  Total elapsed time:  4 hours   Carlton Adam, RN VAD Coordinator   Office: [phone] 24/7 Emergency VAD Pager: [phone]

## 2015-09-20 NOTE — Progress Notes (Signed)
75 y.o. right handed male with history of cardiomyopathy and left bundle branch block. Per chart review patient is married and independent prior to admission. 2 level home with bedroom on main floor. 3 steps to entry. Family can assist as needed. Presented 08/21/2015 through the heart failure clinic with dizziness and low volume output. 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 Found to be in cardiogenic shock. Workup per cardiology services and cardiothoracic surgery patient underwent LVAD 08/30/2015 per Dr. Lucianne Lei trigt. He was extubated 08/31/2015. Received chest tube placement 09/12/2015 for left pleural effusion drained 1650 mL initially. Bouts of confusion with therapies initiated. Placed on Remeron and Celexa for mood stabilization. Patient presently maintained on aspirin and Coumadin therapy  Subjective/Complaints: "I saw you 2 wks ago" We discussed that it was yesterday CP with movementROS- , SOB, N/V/D  Objective: Vital Signs: Blood pressure 74/55, pulse 55, temperature 98.6 F (37 C), temperature source Oral, resp. rate 16, weight 77.3 kg (170 lb 6.7 oz), SpO2 97 %. Dg Chest 2 View  09/18/2015  CLINICAL DATA:  Follow-up positioning of left ventricular assist device placed on June 23rd; no current chest complaints. EXAM: CHEST  2 VIEW COMPARISON:  Portable chest x-ray of September 16, 2015 FINDINGS: The left ventricular assist device appears to be in stable position positioned over the cardiac apex. The cardiac silhouette remains mildly enlarged. The pulmonary vascularity is normal. There is noalveolar infiltrate. Small pleural effusions layer posteriorly, bilaterally. The permanent pacemaker defibrillator is in stable position. The sternal wires are intact. The PICC line tip projects over the midportion of the SVC. There is calcification in the wall of the aortic arch. The observed bony structures exhibit no acute abnormalities. IMPRESSION: Stable appearance of the  left ventricular assist device. Stable mild cardiomegaly without pulmonary vascular congestion or pulmonary edema. Bilateral small posterior pleural effusions. Aortic atherosclerosis. Electronically Signed   By: David  Martinique M.D.   On: 09/18/2015 09:01   Results for orders placed or performed during the hospital encounter of 08/21/15 (from the past 72 hour(s))  Carboxyhemoglobin     Status: Abnormal   Collection Time: 09/17/15 12:25 PM  Result Value Ref Range   Total hemoglobin 8.9 (L) 13.5 - 18.0 g/dL   O2 Saturation 61.9 %   Carboxyhemoglobin 2.0 (H) 0.5 - 1.5 %   Methemoglobin 0.7 0.0 - 1.5 %  Glucose, capillary     Status: Abnormal   Collection Time: 09/17/15  4:24 PM  Result Value Ref Range   Glucose-Capillary 122 (H) 65 - 99 mg/dL   Comment 1 Notify RN    Comment 2 Document in Chart   Glucose, capillary     Status: Abnormal   Collection Time: 09/17/15  9:30 PM  Result Value Ref Range   Glucose-Capillary 130 (H) 65 - 99 mg/dL  Carboxyhemoglobin     Status: Abnormal   Collection Time: 09/18/15  4:44 AM  Result Value Ref Range   Total hemoglobin 8.0 (L) 13.5 - 18.0 g/dL   O2 Saturation 83.4 %   Carboxyhemoglobin 1.8 (H) 0.5 - 1.5 %   Methemoglobin 0.7 0.0 - 1.5 %  Lactate dehydrogenase     Status: Abnormal   Collection Time: 09/18/15  4:50 AM  Result Value Ref Range   LDH 216 (H) 98 - 192 U/L  Protime-INR     Status: Abnormal   Collection Time: 09/18/15  4:50 AM  Result Value Ref Range   Prothrombin Time 22.1 (H) 11.6 - 15.2 seconds  INR 1.95 (H) 0.00 - 1.49  Glucose, capillary     Status: None   Collection Time: 09/18/15  6:25 AM  Result Value Ref Range   Glucose-Capillary 99 65 - 99 mg/dL  Glucose, capillary     Status: Abnormal   Collection Time: 09/18/15 11:22 AM  Result Value Ref Range   Glucose-Capillary 166 (H) 65 - 99 mg/dL   Comment 1 Notify RN    Comment 2 Document in Chart   Basic metabolic panel     Status: Abnormal   Collection Time: 09/18/15 12:04 PM   Result Value Ref Range   Sodium 139 135 - 145 mmol/L   Potassium 3.5 3.5 - 5.1 mmol/L   Chloride 109 101 - 111 mmol/L   CO2 26 22 - 32 mmol/L   Glucose, Bld 135 (H) 65 - 99 mg/dL   BUN 10 6 - 20 mg/dL   Creatinine, Ser 0.73 0.61 - 1.24 mg/dL   Calcium 8.2 (L) 8.9 - 10.3 mg/dL   GFR calc non Af Amer >60 >60 mL/min   GFR calc Af Amer >60 >60 mL/min    Comment: (NOTE) The eGFR has been calculated using the CKD EPI equation. This calculation has not been validated in all clinical situations. eGFR's persistently <60 mL/min signify possible Chronic Kidney Disease.    Anion gap 4 (L) 5 - 15  CBC     Status: Abnormal   Collection Time: 09/18/15 12:04 PM  Result Value Ref Range   WBC 4.4 4.0 - 10.5 K/uL   RBC 3.27 (L) 4.22 - 5.81 MIL/uL   Hemoglobin 8.4 (L) 13.0 - 17.0 g/dL   HCT 28.3 (L) 39.0 - 52.0 %   MCV 86.5 78.0 - 100.0 fL   MCH 25.7 (L) 26.0 - 34.0 pg   MCHC 29.7 (L) 30.0 - 36.0 g/dL   RDW 17.0 (H) 11.5 - 15.5 %   Platelets 178 150 - 400 K/uL  Glucose, capillary     Status: Abnormal   Collection Time: 09/18/15  4:25 PM  Result Value Ref Range   Glucose-Capillary 120 (H) 65 - 99 mg/dL   Comment 1 Notify RN    Comment 2 Document in Chart   Glucose, capillary     Status: Abnormal   Collection Time: 09/18/15  8:56 PM  Result Value Ref Range   Glucose-Capillary 183 (H) 65 - 99 mg/dL  Lactate dehydrogenase     Status: Abnormal   Collection Time: 09/19/15  4:53 AM  Result Value Ref Range   LDH 196 (H) 98 - 192 U/L  Protime-INR     Status: Abnormal   Collection Time: 09/19/15  4:53 AM  Result Value Ref Range   Prothrombin Time 23.5 (H) 11.6 - 15.2 seconds   INR 2.12 (H) 0.00 - 2.42  Basic metabolic panel     Status: Abnormal   Collection Time: 09/19/15  4:53 AM  Result Value Ref Range   Sodium 139 135 - 145 mmol/L   Potassium 4.1 3.5 - 5.1 mmol/L   Chloride 107 101 - 111 mmol/L   CO2 27 22 - 32 mmol/L   Glucose, Bld 85 65 - 99 mg/dL   BUN 9 6 - 20 mg/dL   Creatinine,  Ser 0.79 0.61 - 1.24 mg/dL   Calcium 8.4 (L) 8.9 - 10.3 mg/dL   GFR calc non Af Amer >60 >60 mL/min   GFR calc Af Amer >60 >60 mL/min    Comment: (NOTE) The eGFR has been calculated  using the CKD EPI equation. This calculation has not been validated in all clinical situations. eGFR's persistently <60 mL/min signify possible Chronic Kidney Disease.    Anion gap 5 5 - 15  CBC     Status: Abnormal   Collection Time: 09/19/15  4:53 AM  Result Value Ref Range   WBC 4.1 4.0 - 10.5 K/uL   RBC 3.31 (L) 4.22 - 5.81 MIL/uL   Hemoglobin 8.5 (L) 13.0 - 17.0 g/dL   HCT 46.1 (L) 90.1 - 22.2 %   MCV 86.1 78.0 - 100.0 fL   MCH 25.7 (L) 26.0 - 34.0 pg   MCHC 29.8 (L) 30.0 - 36.0 g/dL   RDW 41.1 (H) 46.4 - 31.4 %   Platelets 176 150 - 400 K/uL  Carboxyhemoglobin     Status: Abnormal   Collection Time: 09/19/15  4:57 AM  Result Value Ref Range   Total hemoglobin 9.0 (L) 13.5 - 18.0 g/dL   O2 Saturation 27.6 %   Carboxyhemoglobin 2.1 (H) 0.5 - 1.5 %   Methemoglobin 0.8 0.0 - 1.5 %  Glucose, capillary     Status: Abnormal   Collection Time: 09/19/15  6:23 AM  Result Value Ref Range   Glucose-Capillary 106 (H) 65 - 99 mg/dL  Glucose, capillary     Status: Abnormal   Collection Time: 09/19/15 11:31 AM  Result Value Ref Range   Glucose-Capillary 133 (H) 65 - 99 mg/dL     HEENT: normal Cardio: LVAD hum Resp: CTA B/L and unlabored GI: BS positive and NT, ND Extremity:  BS positive and NT, ND Skin:   Other LVAD sites dressed Neuro: Confused, Normal Sensory and Abnormal Motor 4/5 in BUE and BLE Musc/Skel:  Other chest pain at surgical sites with movement Gen NAD   Assessment/Plan: 1. Functional deficits secondary to Debilitation secondary to acute on chronic systolic congestive heart failure/recent LVAD 08/30/2015 complicated by left pleural effusion which require 3+ hours per day of interdisciplinary therapy in a comprehensive inpatient rehab setting. Physiatrist is providing close team  supervision and 24 hour management of active medical problems listed below. Physiatrist and rehab team continue to assess barriers to discharge/monitor patient progress toward functional and medical goals. FIM:                                  Medical Problem List and Plan: 1. Debilitation secondary to acute on chronic systolic congestive heart failure/recent LVAD 08/30/2015 complicated by left pleural effusion with chest tube- initiate CIR PT, OT,  2. DVT Prophylaxis/Anticoagulation: Chronic Coumadin therapy. Monitor for any bleeding episodes 3. Pain Management: Ultram as needed, post op pain, avoid narcotics due to confusion 4. Mood: Remeron 15 mg daily at bedtime, Zyprexa 5 mg daily at bedtime, Celexa 10 mg daily 5. Neuropsych: This patient is capable of making decisions on his own behalf. 6. Skin/Wound Care: Routine skin checks 7. Fluids/Electrolytes/Nutrition: Routine I&O's with follow-up chemistries 8. Acute blood loss anemia. Follow-up CBC, Hemoglobins running in the 8 range 9. Hypertension/atrial fibrillation. Amiodarone 200 mg twice a day, Aldactone 25 mg daily. Monitor with increased mobility 10. BPH. Flomax 0.4 mg daily. Check PVR 3 11. Decreased nutritional storage. Follow-up dietary consult 12. Memory deficits,, per wife this was not a pre-existing problem. May have some element of encephalopathy associated with his acute illness- SLP +/- Neuropsych  LOS (Days) 1 A FACE TO FACE EVALUATION WAS PERFORMED  Aleana Fifita E 09/20/2015, 8:21  AM    

## 2015-09-20 NOTE — Progress Notes (Signed)
Driveline Dressing Change  New driveline site dressing removed and site care performed using sterile technique. Drive line exit site cleaned with Chlora prep applicators x 2, allowed to dry, and gauze dressing with aquacel strip re-applied. Exit site healing, the velour is fully implanted at exit site. No tenderness, or foul odor noted. Small amount of thin Sangineous drainage oozed from driveline while cleaning. Drive line anchor re-applied. Pt denies fever or chills. Driveline dressing is being changed daily per sterile technique. Distal Suture at the driveline dressing site removed along with all CT sutures.    Carlton Adam, RN VAD Coordinator 24/7 Pager [phone]

## 2015-09-20 NOTE — Interval H&P Note (Signed)
[name] was admitted today to Inpatient Rehabilitation with the diagnosis of Chronic systolic ht failure s/p LVAD, 9/79.  The patient's history has been reviewed, patient examined, and there is no change in status.  Patient continues to be appropriate for intensive inpatient rehabilitation.  I have reviewed the patient's chart and labs.  Questions were answered to the patient's satisfaction. The PAPE has been reviewed and assessment remains appropriate.  Erick Colace 09/20/2015, 8:35 AM

## 2015-09-20 NOTE — Evaluation (Signed)
Speech Language Pathology Assessment and Plan  Patient Details  Name: [name] MRN: [id number] Date of Birth: 10/27/1940  SLP Diagnosis: Cognitive Impairments  Rehab Potential: Fair ELOS: 10-12 days     Today's Date: 09/20/2015 SLP Individual Time: 1445-1540 SLP Individual Time Calculation (min): 55 min   Problem List:  Patient Active Problem List   Diagnosis Date Noted  . Physical deconditioning 09/19/2015  . 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 Debilitated 09/19/2015  . Other congestive heart failure (HCC)   . Encephalopathy   . H/O chest tube placement   . LBBB (left bundle branch block)   . Generalized OA   . Prediabetes   . Acute delirium   . LVAD (left ventricular assist device) present (HCC)   . Insomnia   . Protein-calorie malnutrition, severe 08/23/2015  . Palliative care encounter   . Goals of care, counseling/discussion   . Acute on chronic systolic (congestive) heart failure (HCC) 08/21/2015  . Acute on chronic systolic CHF (congestive heart failure) (HCC) 08/21/2015  . PVC (premature ventricular contraction) 01/04/2015  . 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 Symptomatic bradycardia 11/28/2012  . 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 Chronic systolic heart failure (HCC) 10/27/2012  . 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 Left bundle branch block 10/06/2012  . Syncope 10/06/2012   Past Medical History:  Past Medical History  Diagnosis Date  . Nephrolithiasis   . GERD (gastroesophageal reflux disease)   . Insomnia   . Nonischemic cardiomyopathy (HCC)     Cardiac catheterization 12/2011 with LVEF 45%, no significant obstructive CAD  . Automatic implantable cardioverter-defibrillator in situ   . H/O hiatal hernia   . Osteoarthritis   . Pneumonia     Dec 2016   Past Surgical History:  Past Surgical History  Procedure Laterality Date  . Exploratory laparotomy  2006?  Marland Kitchen Appendectomy    . Volvulus reduction  2007   . Posterior fusion cervical spine  2001  . Bi-ventricular implantable cardioverter defibrillator  (crt-d)  12/14/2012  . Cardiac catheterization  2003?; ~ 11/2012  . Knee arthroscopy Left 1990's  . Lithotripsy      "twice" (12/14/2012)  . Cystoscopy w/ stone manipulation      "3-4 times" (12/14/2012)  . Bi-ventricular implantable cardioverter defibrillator N/A 12/14/2012    Procedure: BI-VENTRICULAR IMPLANTABLE CARDIOVERTER DEFIBRILLATOR  (CRT-D);  Surgeon: Duke Salvia, MD;  Location: [facility] CATH LAB;  Service: Cardiovascular;  Laterality: N/A;  . Cataract extraction w/phaco Left 05/13/2015    Procedure: CATARACT EXTRACTION PHACO AND INTRAOCULAR LENS PLACEMENT LEFT EYE;  CDE:  3.49;  Surgeon: Susa Simmonds, MD;  Location: [facility] ORS;  Service: Ophthalmology;  Laterality: Left;  . Cataract extraction w/phaco Right 07/22/2015    Procedure: CATARACT EXTRACTION PHACO AND INTRAOCULAR LENS PLACEMENT RIGHT EYE CDE=4.50;  Surgeon: Susa Simmonds, MD;  Location: [facility] ORS;  Service: Ophthalmology;  Laterality: Right;  . Cardiac catheterization N/A 08/22/2015    Procedure: Right/Left Heart Cath and Coronary Angiography;  Surgeon: Dolores Patty, MD;  Location: [facility] INVASIVE CV LAB;  Service: Cardiovascular;  Laterality: N/A;  . Insertion of implantable left ventricular assist device N/A 08/30/2015    Procedure: INSERTION OF IMPLANTABLE LEFT VENTRICULAR ASSIST DEVICE;  Surgeon: Kerin Perna, MD;  Location: [facility] OR;  Service: Open Heart Surgery;  Laterality: N/A;  . Intraoperative transesophageal echocardiogram N/A 08/30/2015    Procedure: INTRAOPERATIVE TRANSESOPHAGEAL ECHOCARDIOGRAM;  Surgeon: Kerin Perna, MD;  Location: [facility] OR;  Service: Open Heart Surgery;  Laterality: N/A;    Assessment / Plan / Recommendation Clinical Impression  [name] is a 75 y.o. right handed male with history of cardiomyopathy and left bundle branch block. Presented 08/21/2015 through the heart failure clinic with  dizziness and low volume output. 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 Found to be in cardiogenic shock. Workup per cardiology services and cardiothoracic surgery patient underwent LVAD 08/30/2015 per Dr. Lucianne Lei trigt. He was extubated 08/31/2015. Bouts of confusion with therapies initiated. Patient was admitted for a comprehensive rehabilitation program 09/19/2015.  SLP evaluation completed on 09/19/2015 with the following results:  Pt presents with moderately severe cognitive impairment characterized by decreased orientation to date and situation, decreased sustained attention to task, and poor intellectual awareness of deficits.  Pt was working full time and independent prior to admission and the abovementioned deficits impact his ability to complete familiar self care tasks independently and safely.  As a result, pt would benefit from skilled ST while inpatient in order to maximize functional independence and reduce burden of care prior to discharge.  Recommend 24/7 supervision at discharge in addition to [place] follow up at next level of care.    Skilled Therapeutic Interventions          Cognitive-linguistic evaluation completed with results and recommendations reviewed with family.   Pt's daughter and wife were present and noted to respond to pt's confused statements by laughing.  Pt's family members also frequently answered questions for pt rather than allow pt to speak for himself.  SLP provided extensive education regarding the importance of orienting to pt to place and situation during periods of confusion. During education, therapist discussed distraction management techniques and ways to improve pt's awareness of his current limitations as he was frequently noted to state that he could drive, take himself to the bathroom, and return to his work responsibilities without difficulty despite therapist's attempts to redirect.  All questions were answered to pt's and family's  satisfaction at this time.  Will continue to reinforce skilled strategy instruction to maximize carryover of techniques in between therapy sessions.      SLP Assessment  Patient will need skilled [place] Pathology Services during CIR admission    Recommendations  Recommendations for Other Services: Neuropsych consult Patient destination: Home Follow up Recommendations: 24 hour supervision/assistance;Home Health SLP;Outpatient SLP Equipment Recommended: None recommended by SLP    SLP Frequency 3 to 5 out of 7 days   SLP Duration  SLP Intensity  SLP Treatment/Interventions 10-12 days   Minumum of 1-2 x/day, 30 to 90 minutes  Cognitive remediation/compensation;Cueing hierarchy;Environmental controls;Functional tasks;Internal/external aids;Patient/family education    Pain Pain Assessment Pain Assessment: No/denies pain  Prior Functioning Cognitive/Linguistic Baseline: Within functional limits Type of Home: House  Lives With: Spouse Available Help at Discharge: Family;Available 24 hours/day Vocation: Full time employment  Function:  Eating Eating                 Cognition Comprehension Comprehension assist level: Understands basic 75 - 89% of the time/ requires cueing 10 - 24% of the time  Expression   Expression assist level: Expresses basic 75 - 89% of the time/requires cueing 10 - 24% of the time. Needs helper to occlude trach/needs to repeat words.  Social Interaction Social Interaction assist level: Interacts appropriately 50 - 74% of the time - May be physically or verbally inappropriate.  Problem Solving Problem solving assist level: Solves basic 25 - 49% of the time - needs direction  more than half the time to initiate, plan or complete simple activities  Memory Memory assist level: Recognizes or recalls 25 - 49% of the time/requires cueing 50 - 75% of the time   Short Term Goals: Week 1: SLP Short Term Goal 1 (Week 1): Pt will sustain his attention  to a basic, familiar tasks for 3 minutes with min verbal cues for redirection.  SLP Short Term Goal 2 (Week 1): Pt will complete a basic, familiar task with min assist verbal cues for functional problem solving.   SLP Short Term Goal 3 (Week 1): Pt will utilize external aids to orient to date and situation with min assist verbal cues.    Refer to Care Plan for Long Term Goals  Recommendations for other services: Neuropsych  Discharge Criteria: Patient will be discharged from SLP if patient refuses treatment 3 consecutive times without medical reason, if treatment goals not met, if there is a change in medical status, if patient makes no progress towards goals or if patient is discharged from hospital.  The above assessment, treatment plan, treatment alternatives and goals were discussed and mutually agreed upon: by patient and by family  Tamorah Hada, Selinda Orion 09/20/2015, 4:25 PM

## 2015-09-20 NOTE — Progress Notes (Signed)
Social Work  Social Work Assessment and Plan  Patient Details  Name: [name] MRN: [id number] Date of Birth: 1940-03-13  Today's Date: 09/20/2015  Problem List:  Patient Active Problem List   Diagnosis Date Noted  . Physical deconditioning 09/19/2015  . 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 Debilitated 09/19/2015  . Other congestive heart failure (HCC)   . Encephalopathy   . H/O chest tube placement   . LBBB (left bundle branch block)   . Generalized OA   . Prediabetes   . Acute delirium   . LVAD (left ventricular assist device) present (HCC)   . Insomnia   . Protein-calorie malnutrition, severe 08/23/2015  . Palliative care encounter   . Goals of care, counseling/discussion   . Acute on chronic systolic (congestive) heart failure (HCC) 08/21/2015  . Acute on chronic systolic CHF (congestive heart failure) (HCC) 08/21/2015  . PVC (premature ventricular contraction) 01/04/2015  . 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 Symptomatic bradycardia 11/28/2012  . 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 Chronic systolic heart failure (HCC) 10/27/2012  . 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 Left bundle branch block 10/06/2012  . Syncope 10/06/2012   Past Medical History:  Past Medical History  Diagnosis Date  . Nephrolithiasis   . GERD (gastroesophageal reflux disease)   . Insomnia   . Nonischemic cardiomyopathy (HCC)     Cardiac catheterization 12/2011 with LVEF 45%, no significant obstructive CAD  . Automatic implantable cardioverter-defibrillator in situ   . H/O hiatal hernia   . Osteoarthritis   . Pneumonia     Dec 2016   Past Surgical History:  Past Surgical History  Procedure Laterality Date  . Exploratory laparotomy  2006?  Marland Kitchen Appendectomy    . Volvulus reduction  2007  . Posterior fusion cervical spine  2001  . Bi-ventricular implantable cardioverter defibrillator  (crt-d)  12/14/2012  . Cardiac catheterization  2003?; ~ 11/2012  .  Knee arthroscopy Left 1990's  . Lithotripsy      "twice" (12/14/2012)  . Cystoscopy w/ stone manipulation      "3-4 times" (12/14/2012)  . Bi-ventricular implantable cardioverter defibrillator N/A 12/14/2012    Procedure: BI-VENTRICULAR IMPLANTABLE CARDIOVERTER DEFIBRILLATOR  (CRT-D);  Surgeon: Duke Salvia, MD;  Location: [facility] CATH LAB;  Service: Cardiovascular;  Laterality: N/A;  . Cataract extraction w/phaco Left 05/13/2015    Procedure: CATARACT EXTRACTION PHACO AND INTRAOCULAR LENS PLACEMENT LEFT EYE;  CDE:  3.49;  Surgeon: Susa Simmonds, MD;  Location: [facility] ORS;  Service: Ophthalmology;  Laterality: Left;  . Cataract extraction w/phaco Right 07/22/2015    Procedure: CATARACT EXTRACTION PHACO AND INTRAOCULAR LENS PLACEMENT RIGHT EYE CDE=4.50;  Surgeon: Susa Simmonds, MD;  Location: [facility] ORS;  Service: Ophthalmology;  Laterality: Right;  . Cardiac catheterization N/A 08/22/2015    Procedure: Right/Left Heart Cath and Coronary Angiography;  Surgeon: Dolores Patty, MD;  Location: [facility] INVASIVE CV LAB;  Service: Cardiovascular;  Laterality: N/A;  . Insertion of implantable left ventricular assist device N/A 08/30/2015    Procedure: INSERTION OF IMPLANTABLE LEFT VENTRICULAR ASSIST DEVICE;  Surgeon: Kerin Perna, MD;  Location: [facility] OR;  Service: Open Heart Surgery;  Laterality: N/A;  . Intraoperative transesophageal echocardiogram N/A 08/30/2015    Procedure: INTRAOPERATIVE TRANSESOPHAGEAL ECHOCARDIOGRAM;  Surgeon: Kerin Perna, MD;  Location: [facility] OR;  Service: Open Heart Surgery;  Laterality: N/A;   Social History:  reports that he has never smoked. His smokeless tobacco use includes Chew. He reports that he does  not drink alcohol or use illicit drugs.  Family / Support Systems Marital Status: Married Patient Roles: Spouse, Parent, Other (Comment) (employee-[employer]) Spouse/Significant Other: Carney Bern [phone]  [phone] Children: Bjorn Loser Evans-daughter  [phone]  [phone] Other Supports: Sister here Anticipated Caregiver: Carney Bern Ability/Limitations of Caregiver: Wife is only able to provide supervision level Caregiver Availability: 24/7 Family Dynamics: Close knit family who are supportive and involved with one another. Staying here due to pt's confusion so he does not pull out his LVAD-drive line. He has friend and church members who are supportive  Social History Preferred language: English Religion: Baptist Cultural Background: No issues Education: [employer] Read: Yes Write: Yes Employment Status: Employed Name of Employer: [employer]  Return to Work Plans: Unsure at this time Fish farm manager Issues: No issues Guardian/Conservator: None-according to MD pt is not capable of making his own deicisons at this time. Will look toward wife to make any decisions while here   Abuse/Neglect Physical Abuse: Denies Verbal Abuse: Denies Sexual Abuse: Denies Exploitation of patient/patient's resources: Denies Self-Neglect: Denies  Emotional Status Pt's affect, behavior adn adjustment status: Pt is not able to participate in interview due to his confusion. Information recieved from wife. Pt has always been very independent and relied upon himself he has been healthy and helped others, this is completely opposite roles for him. Recent Psychosocial Issues: other health issues were managed Pyschiatric History: No history deferred depression screen due to pt not able to participate due to confusion. Wife reports he has no issues Substance Abuse History: None  Patient / Family Perceptions, Expectations & Goals Pt/Family understanding of illness & functional limitations: Wife talks with MD daily and gets her questions answered, she has numerous ones daily and she feels they have been patient with her and her family. She will be here daily to learn all of his care in regards to the LVAD. She hopes this confusion clears. Premorbid pt/family  roles/activities: Husband, Father, grandfather, Employee, church member, Chief Financial Officer, etc Anticipated changes in roles/activities/participation: resume Pt/family expectations/goals: Wife states: " I hope he can clear this is so different than he has ever been."  Daughter states: " We were told with time he will get better."  [employer]: None Premorbid Home Care/DME Agencies: None Transportation available at discharge: [employer] referrals recommended: Neuropsychology, Support group (specify)  Discharge Planning Living Arrangements: Spouse/significant other Support Systems: Spouse/significant other, Children, Other relatives, Manufacturing engineer, Psychologist, clinical community Type of Residence: Private residence Community education officer Resources: [employer], Media planner (specify) Herbalist) Financial Resources: Employment, Garment/textile technologist Screen Referred: No Living Expenses: Own Money Management: Spouse, Patient Does the patient have any problems obtaining your medications?: No Home Management: Wife does home management Patient/Family Preliminary Plans: Return home with wife who can provide supervision level only due to her own helath issues. His sister and daughter will assist if needed. Will await team' evaluations and work on a safe discharge plan. LVAD teaching will take place here on rehab. Social Work Anticipated Follow Up Needs: HH/OP, Support Group  Clinical Impression Very confused patient who is unable to participate in assessment, wife provided information. Wife is staying here with pt so he does not pull his drive line-LVAD out. She can provide supervision at discharge. All are hopeful pt's confusion clears due to he had none of this prior to admission. Pt was very active and worked full time prior to admission. Will await therapy evaluations and refer to neuro-psych services. LVAD training has already begun with family.   Koren Plyler,  Lemar Livings 09/20/2015, 11:40  AM

## 2015-09-20 NOTE — IPOC Note (Signed)
Overall Plan of Care [facility]) Patient Details Name: [name] MRN: [id number] DOB: 12-16-1940  Admitting Diagnosis: LAVD  Hospital Problems: Active Problems:   Debilitated     Functional Problem List: Nursing    PT Balance, Endurance, Motor, Safety  OT Balance, Cognition, Endurance, Pain, Safety  SLP Cognition  TR         Basic ADL's: OT Grooming, Bathing, Dressing, Toileting     Advanced  ADL's: OT       Transfers: PT Bed Mobility, Bed to Chair, Car, Oncologist: PT Ambulation, Stairs     Additional Impairments: OT    SLP Social Cognition   Problem Solving, Memory, Attention, Awareness  TR      Anticipated Outcomes Item Anticipated Outcome  Self Feeding no goal  Swallowing      Basic self-care  Mod I - supervision  Toileting  Mod I   Bathroom Transfers Mod I  Bowel/Bladder     Transfers  Supervision  Locomotion  Supervision  Communication     Cognition  Min assist   Pain     Safety/Judgment      Therapy Plan: PT Intensity: Minimum of 1-2 x/day ,45 to 90 minutes PT Frequency: 5 out of 7 days PT Duration Estimated Length of Stay: 12-14 days OT Intensity: Minimum of 1-2 x/day, 45 to 90 minutes OT Frequency: 5 out of 7 days OT Duration/Estimated Length of Stay: 10-12 days SLP Intensity: Minumum of 1-2 x/day, 30 to 90 minutes SLP Frequency: 3 to 5 out of 7 days SLP Duration/Estimated Length of Stay: 10-12 days        Team Interventions: Nursing Interventions    PT interventions Ambulation/gait training, Discharge planning, Functional mobility training, Psychosocial support, Therapeutic Activities, Balance/vestibular training, Disease management/prevention, Neuromuscular re-education, Therapeutic Exercise, Cognitive remediation/compensation, DME/adaptive equipment instruction, Pain management, UE/LE Strength taining/ROM, Community reintegration, Equities trader education, Museum/gallery curator  OT Interventions  Warden/ranger, Cognitive remediation/compensation, Firefighter, Discharge planning, Disease mangement/prevention, Fish farm manager, Functional mobility training, Pain management, Patient/family education, Psychosocial support, Self Care/advanced ADL retraining, Skin care/wound managment, Therapeutic Activities, Therapeutic Exercise  SLP Interventions Cognitive remediation/compensation, Cueing hierarchy, Environmental controls, Functional tasks, Internal/external aids, Patient/family education  TR Interventions    SW/CM Interventions Discharge Planning, Psychosocial Support, Patient/Family Education    Team Discharge Planning: Destination: PT-Home ,OT- Home , SLP-Home Projected Follow-up: PT-Home health PT, 24 hour supervision/assistance, OT-  Home health OT, SLP-24 hour supervision/assistance, Home Health SLP, Outpatient SLP Projected Equipment Needs: PT-Wheelchair (measurements), Wheelchair cushion (measurements), Rolling walker with 5" wheels, OT- To be determined, SLP-None recommended by SLP Equipment Details: PT-transport w/c, 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 right handed male with history of cardiomyopathy and left bundle branch block. Per chart review patient is married and independent prior to admission. 2 level home with bedroom on main floor. 3 steps to entry. Family can assist as needed. 75 08/21/2015 through the heart failure clinic with dizziness and low volume output. 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 Found to be in cardiogenic shock. Workup per cardiology services and cardiothoracic surgery patient underwent LVAD 08/30/2015 per Dr. Zenaida Niece trigt. He was  extubated 08/31/2015. Received chest tube placement 09/12/2015 for left pleural effusion drained 1650 mL initially. Bouts of confusion with therapies  initiated. Placed on Remeron and Celexa for mood stabilization. Patient presently maintained on aspirin and Coumadin therapy. Acute on chronic anemia 8.5 and monitored. Physical and occupational therapy evaluations completed and ongoing with recommendations of physical medicine rehabilitation consult  Now requiring 24/7 Rehab RN,MD, as well as CIR level PT, OT and SLP.  Treatment team will focus on ADLs and mobility with goals set at    See Team Conference Notes for weekly updates to the plan of care

## 2015-09-20 NOTE — H&P (View-Only) (Signed)
Physical Medicine and Rehabilitation Admission H&P    Chief complaint: Weakness  HPI: [name] is a 75 y.o. right handed male with history of cardiomyopathy and left bundle branch block. Per chart review patient is married and independent prior to admission. 2 level home with bedroom on main floor. 3 steps to entry. Family can assist as needed. Presented 08/21/2015 through the heart failure clinic with dizziness and low volume output. 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 Found to be in cardiogenic shock. Workup per cardiology services and cardiothoracic surgery patient underwent LVAD 08/30/2015 per Dr. Lucianne Lei trigt. He was extubated 08/31/2015. Received chest tube placement 09/12/2015 for left pleural effusion drained 1650 mL initially. Bouts of confusion with therapies initiated. Placed on Remeron and Celexa for mood stabilization. Patient presently maintained on aspirin and Coumadin therapy. Acute on chronic anemia 8.5 and monitored. Physical and occupational therapy evaluations completed and ongoing with recommendations of physical medicine rehabilitation consult. Patient was admitted for a comprehensive rehabilitation program  Patient is discussing the events of the day he was admitted.  His wife states he gets a lot of the details wrong. She states that he usually is not forgetful at home.  ROS Constitutional: Positive for malaise/fatigue. Negative for fever and chills.  HENT: Negative for hearing loss.  Eyes: Negative for blurred vision and double vision.  Respiratory: Positive for shortness of breath. Negative for cough.  Cardiovascular: Negative for chest pain.  Gastrointestinal: Positive for constipation. Negative for nausea.   GERD  Genitourinary: Positive for urgency. Negative for dysuria and hematuria.  Musculoskeletal: Positive for myalgias.  Neurological: Positive for weakness. Negative for seizures and headaches.    Psychiatric/Behavioral: The patient has insomnia.  All other systems reviewed and are negative   Past Medical History  Diagnosis Date  . Nephrolithiasis   . GERD (gastroesophageal reflux disease)   . Insomnia   . Nonischemic cardiomyopathy ([place])     Cardiac catheterization 12/2011 with LVEF 45%, no significant obstructive CAD  . Automatic implantable cardioverter-defibrillator in situ   . H/O hiatal hernia   . Osteoarthritis   . Pneumonia     Dec 2016   Past Surgical History  Procedure Laterality Date  . Exploratory laparotomy  2006?  Marland Kitchen Appendectomy    . Volvulus reduction  2007  . Posterior fusion cervical spine  2001  . Bi-ventricular implantable cardioverter defibrillator  (crt-d)  12/14/2012  . Cardiac catheterization  2003?; ~ 11/2012  . Knee arthroscopy Left 1990's  . Lithotripsy      "twice" (12/14/2012)  . Cystoscopy w/ stone manipulation      "3-4 times" (12/14/2012)  . Bi-ventricular implantable cardioverter defibrillator N/A 12/14/2012    Procedure: BI-VENTRICULAR IMPLANTABLE CARDIOVERTER DEFIBRILLATOR  (CRT-D);  Surgeon: Deboraha Sprang, MD;  Location: [facility] CATH LAB;  Service: Cardiovascular;  Laterality: N/A;  . Cataract extraction w/phaco Left 05/13/2015    Procedure: CATARACT EXTRACTION PHACO AND INTRAOCULAR LENS PLACEMENT LEFT EYE;  CDE:  3.49;  Surgeon: Williams Che, MD;  Location: [facility] ORS;  Service: Ophthalmology;  Laterality: Left;  . Cataract extraction w/phaco Right 07/22/2015    Procedure: CATARACT EXTRACTION PHACO AND INTRAOCULAR LENS PLACEMENT RIGHT EYE CDE=4.50;  Surgeon: Williams Che, MD;  Location: [facility] ORS;  Service: Ophthalmology;  Laterality: Right;  . Cardiac catheterization N/A 08/22/2015    Procedure: Right/Left Heart Cath and Coronary Angiography;  Surgeon: Jolaine Artist, MD;  Location: [facility] CV LAB;  Service: Cardiovascular;  Laterality:  N/A;  . Insertion of implantable left ventricular assist device N/A 08/30/2015    Procedure:  INSERTION OF IMPLANTABLE LEFT VENTRICULAR ASSIST DEVICE;  Surgeon: Ivin Poot, MD;  Location: [facility];  Service: Open Heart Surgery;  Laterality: N/A;  . Intraoperative transesophageal echocardiogram N/A 08/30/2015    Procedure: INTRAOPERATIVE TRANSESOPHAGEAL ECHOCARDIOGRAM;  Surgeon: Ivin Poot, MD;  Location: [facility];  Service: Open Heart Surgery;  Laterality: N/A;   Family History  Problem Relation Age of Onset  . Cancer - Prostate Maternal Uncle   . Heart failure Mother   . Stroke Mother   . Asthma Mother   . Heart disease Father   . Heart failure Father   . Congestive Heart Failure Brother     @ 62 Heart transplant, [place]  . Congestive Heart Failure Brother     ICD  . Heart failure Sister     ICD  . Cancer - Ovarian Sister     deceased @ 33   Social History:  reports that he has never smoked. His smokeless tobacco use includes Chew. He reports that he does not drink alcohol or use illicit drugs. Allergies:  Allergies  Allergen Reactions  . Phenergan [Promethazine Hcl] Nausea And Vomiting  . Lasix [Furosemide] Other (See Comments)    "Dropped blood pressure too low"  . Morphine And Related Other (See Comments)    Goes crazy    Medications Prior to Admission  Medication Sig Dispense Refill  . Calcium Carbonate-Vitamin D (CALTRATE 600+D PO) Take 1 tablet by mouth daily.    . Carboxymethylcellulose Sodium (THERATEARS OP) Apply 1 drop to eye 3 (three) times daily as needed (dry eyes).     Marland Kitchen Cod Liver Oil 1000 MG CAPS Take 1 capsule by mouth daily.    . digoxin (LANOXIN) 0.125 MG tablet Take 0.125 mg by mouth daily. Take 0.5 tablet by mouth each morning.    . escitalopram (LEXAPRO) 10 MG tablet Take 10 mg by mouth daily.    . furosemide (LASIX) 20 MG tablet Take 1 tablet (20 mg total) by mouth daily. 90 tablet 3  . Glucosamine Sulfate 1000 MG CAPS Take 1,000 mg by mouth daily.    . ivabradine (CORLANOR) 5 MG TABS tablet Take 1 tablet (5 mg total) by mouth 2 (two) times daily  with a meal. 60 tablet 6  . ketorolac (ACULAR) 0.5 % ophthalmic solution Place 1 drop into the left eye 2 (two) times daily.     . Multiple Vitamin (MULTIVITAMIN) tablet Take 1 tablet by mouth daily.    . pantoprazole (PROTONIX) 40 MG tablet Take 40 mg by mouth daily.    . potassium chloride SA (K-DUR,KLOR-CON) 20 MEQ tablet Take 20 mEq by mouth daily.    . prednisoLONE acetate (PRED FORTE) 1 % ophthalmic suspension Place 1 drop into the left eye 4 (four) times daily.    . Psyllium (METAMUCIL PO) Take 1 tablet by mouth daily.    Marland Kitchen spironolactone (ALDACTONE) 25 MG tablet Take 1 tablet (25 mg total) by mouth daily. 90 tablet 3  . vitamin A 8000 UNIT capsule Take 8,000 Units by mouth 3 (three) times a week.     . vitamin B-12 (CYANOCOBALAMIN) 100 MCG tablet Take 100 mcg by mouth daily.    . vitamin C (ASCORBIC ACID) 500 MG tablet Take 500 mg by mouth every other day.     . zolpidem (AMBIEN) 10 MG tablet Take 10 mg by mouth at bedtime as needed for  sleep (Take 0.5 to 1.0 tablet by mouth as needed for insomnia.).      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  Was beginning to feel SOB after ambulating ~300-400 ft.  Functional Status:  Mobility: Bed Mobility Overal bed mobility: Needs Assistance, +2 for physical assistance Bed  Mobility: Sit to Supine Supine to sit: Mod assist, HOB elevated Sit to supine: Max assist Sit to sidelying: Mod assist General bed mobility comments: Assist to elevate trunk into sitting Transfers Overall transfer level: Needs assistance Equipment used: 2 person hand held assist Transfer via Lift Equipment: Stedy Transfers: Sit to/from Stand Sit to Stand: Min assist Stand pivot transfers: Min assist, +2 physical assistance General transfer comment: Assist to bring hips up and verbal cues to have pt place hands on knees. More assist from low chair than elevated bed Ambulation/Gait Ambulation/Gait assistance: Min guard Ambulation Distance (Feet): 550 Feet Assistive device: 4-wheeled walker Gait Pattern/deviations: Step-through pattern, Decreased stride length, Trunk flexed General Gait Details: Pt handling rollator without difficulty. Verbal cues to stand more erect and look up. Occasional cues to slow pace. Gait velocity: Too fast for his abilities at times. Gait velocity interpretation: at or above normal speed for age/gender    ADL: ADL Overall ADL's : Needs assistance/impaired Eating/Feeding: Moderate assistance Grooming: Wash/dry face, Oral care, Min guard, Standing Grooming Details (indicate cue type and reason): Pt brushed teeth and washed face. Needed VCs for finding toothpaste top and putting it back on toothpaste. Also was going to wash is face when a very dripping washcloth that he had not rung out--VCs to the need to wring it out first so as to not get water all over the place Upper Body Bathing: Minimal assitance, Sitting Lower Body Bathing: Moderate assistance, Sit to/from stand Upper Body Dressing : Moderate assistance, Sitting Lower Body Dressing: Moderate assistance, Sit to/from stand Toilet Transfer: Minimal assistance, +2 for physical assistance Toilet Transfer Details (indicate cue type and reason): simulated sit<>stand from recliner Toileting- Clothing  Manipulation and Hygiene: Moderate assistance, Sit to/from stand, +2 for safety/equipment Functional mobility during ADLs: Minimal assistance, +2 for physical assistance, Rolling walker General ADL Comments: Max verbal and tactile cues to get pt to scoot hips forward/backward in chair. Pt not wanting to use his L hand becuase  he "doesn't want to pull anything loose". Pt appeared confused as to the power source he was connected to. wife also though pt was on battery source when he was connected to wall power. Educated wife that pt needed to be hooked up to his main power source if there was a chance he could fall asleep. Pt then began discussing how someone was "delivering" something and also thought it was "June". wife then asked why pt hadn't learned how to switch power sources himself.   Cognition: Cognition Overall Cognitive Status: Impaired/Different from baseline Orientation Level: Oriented to person, Oriented to place Cognition Arousal/Alertness: Awake/alert Behavior During Therapy: [facility] for  tasks assessed/performed Overall Cognitive Status: Impaired/Different from baseline Area of Impairment: Orientation, Attention, Memory, Following commands, Problem solving, Awareness, Safety/judgement Orientation Level: Disoriented to, Time Current Attention Level: Sustained Memory: Decreased recall of precautions, Decreased short-term memory Following Commands: Follows one step commands consistently Safety/Judgement: Decreased awareness of safety, Decreased awareness of deficits Awareness: Emergent Problem Solving: Slow processing, Difficulty sequencing General Comments: motor plannind deficits also noted   Physical Exam: Blood pressure 84/72, pulse 70, temperature 98.3 F (36.8 C), temperature source Oral, resp. rate 18, height '6\' 2"'$  (1.88 m), weight 79.3 kg (174 lb 13.2 oz), SpO2 97 %. Physical Exam Constitutional: He is oriented to person, place, and time. He appears well-developed and  well-nourished.  HENT:  Head: Normocephalic and atraumatic.  Eyes: Conjunctivae and EOM are normal.  Neck: Normal range of motion. Neck supple.  Cardiovascular: Normal rate and regular rhythm.  +Hum  Respiratory: Effort normal and breath sounds normal.  GI: Soft. Bowel sounds are normal.  Musculoskeletal: He exhibits no edema or tenderness.  Neurological: He is alert and oriented to person, place, and time with some mild delay in processing.  Fair but limited awareness of deficits. Sensation intact to light touch Motor: 4/5 grossly throughout Bilateral deltoid, biceps, triceps, grip, hip flexor, knee extensor, ankle dorsi flexors and plantar flexors Sensation is intact to light touch in bilateral upper and lower limbs Skin: Skin is warm and dry.  Psychiatric: He has a normal mood and affect   Results for orders placed or performed during the hospital encounter of 08/21/15 (from the past 48 hour(s))  Carboxyhemoglobin     Status: Abnormal   Collection Time: 09/17/15 12:25 PM  Result Value Ref Range   Total hemoglobin 8.9 (L) 13.5 - 18.0 g/dL   O2 Saturation 61.9 %   Carboxyhemoglobin 2.0 (H) 0.5 - 1.5 %   Methemoglobin 0.7 0.0 - 1.5 %  Glucose, capillary     Status: Abnormal   Collection Time: 09/17/15  4:24 PM  Result Value Ref Range   Glucose-Capillary 122 (H) 65 - 99 mg/dL   Comment 1 Notify RN    Comment 2 Document in Chart   Glucose, capillary     Status: Abnormal   Collection Time: 09/17/15  9:30 PM  Result Value Ref Range   Glucose-Capillary 130 (H) 65 - 99 mg/dL  Carboxyhemoglobin     Status: Abnormal   Collection Time: 09/18/15  4:44 AM  Result Value Ref Range   Total hemoglobin 8.0 (L) 13.5 - 18.0 g/dL   O2 Saturation 83.4 %   Carboxyhemoglobin 1.8 (H) 0.5 - 1.5 %   Methemoglobin 0.7 0.0 - 1.5 %  Lactate dehydrogenase     Status: Abnormal   Collection Time: 09/18/15  4:50 AM  Result Value Ref Range   LDH 216 (H) 98 - 192 U/L  Protime-INR     Status: Abnormal    Collection Time: 09/18/15  4:50 AM  Result Value Ref Range   Prothrombin Time 22.1 (H) 11.6 - 15.2 seconds   INR 1.95 (H) 0.00 - 1.49  Glucose, capillary     Status: None   Collection Time: 09/18/15  6:25 AM  Result Value Ref Range   Glucose-Capillary 99 65 - 99 mg/dL  Glucose, capillary     Status: Abnormal   Collection Time: 09/18/15 11:22 AM  Result Value Ref Range   Glucose-Capillary 166 (H) 65 - 99 mg/dL   Comment 1 Notify RN    Comment 2 Document in Chart  Basic metabolic panel     Status: Abnormal   Collection Time: 09/18/15 12:04 PM  Result Value Ref Range   Sodium 139 135 - 145 mmol/L   Potassium 3.5 3.5 - 5.1 mmol/L   Chloride 109 101 - 111 mmol/L   CO2 26 22 - 32 mmol/L   Glucose, Bld 135 (H) 65 - 99 mg/dL   BUN 10 6 - 20 mg/dL   Creatinine, Ser 0.73 0.61 - 1.24 mg/dL   Calcium 8.2 (L) 8.9 - 10.3 mg/dL   GFR calc non Af Amer >60 >60 mL/min   GFR calc Af Amer >60 >60 mL/min    Comment: (NOTE) The eGFR has been calculated using the CKD EPI equation. This calculation has not been validated in all clinical situations. eGFR's persistently <60 mL/min signify possible Chronic Kidney Disease.    Anion gap 4 (L) 5 - 15  CBC     Status: Abnormal   Collection Time: 09/18/15 12:04 PM  Result Value Ref Range   WBC 4.4 4.0 - 10.5 K/uL   RBC 3.27 (L) 4.22 - 5.81 MIL/uL   Hemoglobin 8.4 (L) 13.0 - 17.0 g/dL   HCT 28.3 (L) 39.0 - 52.0 %   MCV 86.5 78.0 - 100.0 fL   MCH 25.7 (L) 26.0 - 34.0 pg   MCHC 29.7 (L) 30.0 - 36.0 g/dL   RDW 17.0 (H) 11.5 - 15.5 %   Platelets 178 150 - 400 K/uL  Glucose, capillary     Status: Abnormal   Collection Time: 09/18/15  4:25 PM  Result Value Ref Range   Glucose-Capillary 120 (H) 65 - 99 mg/dL   Comment 1 Notify RN    Comment 2 Document in Chart   Glucose, capillary     Status: Abnormal   Collection Time: 09/18/15  8:56 PM  Result Value Ref Range   Glucose-Capillary 183 (H) 65 - 99 mg/dL  Lactate dehydrogenase     Status: Abnormal    Collection Time: 09/19/15  4:53 AM  Result Value Ref Range   LDH 196 (H) 98 - 192 U/L  Protime-INR     Status: Abnormal   Collection Time: 09/19/15  4:53 AM  Result Value Ref Range   Prothrombin Time 23.5 (H) 11.6 - 15.2 seconds   INR 2.12 (H) 0.00 - 9.37  Basic metabolic panel     Status: Abnormal   Collection Time: 09/19/15  4:53 AM  Result Value Ref Range   Sodium 139 135 - 145 mmol/L   Potassium 4.1 3.5 - 5.1 mmol/L   Chloride 107 101 - 111 mmol/L   CO2 27 22 - 32 mmol/L   Glucose, Bld 85 65 - 99 mg/dL   BUN 9 6 - 20 mg/dL   Creatinine, Ser 0.79 0.61 - 1.24 mg/dL   Calcium 8.4 (L) 8.9 - 10.3 mg/dL   GFR calc non Af Amer >60 >60 mL/min   GFR calc Af Amer >60 >60 mL/min    Comment: (NOTE) The eGFR has been calculated using the CKD EPI equation. This calculation has not been validated in all clinical situations. eGFR's persistently <60 mL/min signify possible Chronic Kidney Disease.    Anion gap 5 5 - 15  CBC     Status: Abnormal   Collection Time: 09/19/15  4:53 AM  Result Value Ref Range   WBC 4.1 4.0 - 10.5 K/uL   RBC 3.31 (L) 4.22 - 5.81 MIL/uL   Hemoglobin 8.5 (L) 13.0 - 17.0 g/dL   HCT  28.5 (L) 39.0 - 52.0 %   MCV 86.1 78.0 - 100.0 fL   MCH 25.7 (L) 26.0 - 34.0 pg   MCHC 29.8 (L) 30.0 - 36.0 g/dL   RDW 17.1 (H) 11.5 - 15.5 %   Platelets 176 150 - 400 K/uL  Carboxyhemoglobin     Status: Abnormal   Collection Time: 09/19/15  4:57 AM  Result Value Ref Range   Total hemoglobin 9.0 (L) 13.5 - 18.0 g/dL   O2 Saturation 71.8 %   Carboxyhemoglobin 2.1 (H) 0.5 - 1.5 %   Methemoglobin 0.8 0.0 - 1.5 %  Glucose, capillary     Status: Abnormal   Collection Time: 09/19/15  6:23 AM  Result Value Ref Range   Glucose-Capillary 106 (H) 65 - 99 mg/dL   Dg Chest 2 View  09/18/2015  CLINICAL DATA:  Follow-up positioning of left ventricular assist device placed on June 23rd; no current chest complaints. EXAM: CHEST  2 VIEW COMPARISON:  Portable chest x-ray of September 16, 2015  FINDINGS: The left ventricular assist device appears to be in stable position positioned over the cardiac apex. The cardiac silhouette remains mildly enlarged. The pulmonary vascularity is normal. There is noalveolar infiltrate. Small pleural effusions layer posteriorly, bilaterally. The permanent pacemaker defibrillator is in stable position. The sternal wires are intact. The PICC line tip projects over the midportion of the SVC. There is calcification in the wall of the aortic arch. The observed bony structures exhibit no acute abnormalities. IMPRESSION: Stable appearance of the left ventricular assist device. Stable mild cardiomegaly without pulmonary vascular congestion or pulmonary edema. Bilateral small posterior pleural effusions. Aortic atherosclerosis. Electronically Signed   By: David  Martinique M.D.   On: 09/18/2015 09:01       Medical Problem List and Plan: 1.  Debilitation secondary to acute on chronic systolic congestive heart failure/recent LVAD 46/50/3546 complicated by left pleural effusion with chest tube 2.  DVT Prophylaxis/Anticoagulation: Chronic Coumadin therapy. Monitor for any bleeding episodes 3. Pain Management: Ultram as needed 4. Mood: Remeron 15 mg daily at bedtime, Zyprexa 5 mg daily at bedtime, Celexa 10 mg daily 5. Neuropsych: This patient is capable of making decisions on his own behalf. 6. Skin/Wound Care: Routine skin checks 7. Fluids/Electrolytes/Nutrition: Routine I&O's with follow-up chemistries 8. Acute blood loss anemia. Follow-up CBC, Hemoglobins running in the 8 range 9. Hypertension/atrial fibrillation. Amiodarone 200 mg twice a day, Aldactone 25 mg daily. Monitor with increased mobility 10. BPH. Flomax 0.4 mg daily. Check PVR 3 11. Decreased nutritional storage. Follow-up dietary consult 12.  Memory deficits,, per wife this was not a pre-existing problem.  May have some element of encephalopathy  associated with his acute illness.We will ask speech therapy  to evaluate  Post Admission Physician Evaluation: 1. Functional deficits secondary  to Debilitation  Secondary to chronic systolic congestive heart failure. 2. Patient is admitted to receive collaborative, interdisciplinary care between the physiatrist, rehab nursing staff, and therapy team. 3. Patient's level of medical complexity and substantial therapy needs in context of that medical necessity cannot be provided at a lesser intensity of care such as a SNF. 4. Patient has experienced substantial functional loss from his/her baseline which was documented above under the "Functional History" and "Functional Status" headings.  Judging by the patient's diagnosis, physical exam, and functional history, the patient has potential for functional progress which will result in measurable gains while on inpatient rehab.  These gains will be of substantial and practical use upon discharge  in  facilitating mobility and self-care at the household level. 5. Physiatrist will provide 24 hour management of medical needs as well as oversight of the therapy plan/treatment and provide guidance as appropriate regarding the interaction of the two. 6. 24 hour rehab nursing will assist with bladder management, bowel management, safety, skin/wound care, disease management, medication administration, pain management and patient education  and help integrate therapy concepts, techniques,education, etc. 7. PT will assess and treat for/with: pre gait, gait training, endurance , safety, equipment, neuromuscular re education.   Goals are: Sup. 8. OT will assess and treat for/with: ADLs, Cognitive perceptual skills, Neuromuscular re education, safety, endurance, equipment.   Goals are: ModI/Sup. Therapy may not proceed with showering this patient. 9. SLP will assess and treat for/with: Memory, attention, concentration, problem solving.  Goals are: Modified independent. 10. Case Management and Social Worker will assess and treat for  psychological issues and discharge planning. 11. Team conference will be held weekly to assess progress toward goals and to determine barriers to discharge. 12. Patient will receive at least 3 hours of therapy per day at least 5 days per week. 13. ELOS: 5-7 days       14. Prognosis:  good     Charlett Blake M.D. [facility] FAAPM&R (Sports Med, Neuromuscular Med) Diplomate Am Board of Electrodiagnostic Med   09/19/2015

## 2015-09-20 NOTE — Evaluation (Signed)
Occupational Therapy Assessment and Plan  Patient Details  Name: [name] MRN: [id number] Date of Birth: 16-Oct-1940  OT Diagnosis: abnormal posture, cognitive deficits and muscle weakness (generalized) Rehab Potential: Rehab Potential (ACUTE ONLY): Good ELOS: 10-12 days   Today's Date: 09/20/2015 OT Individual Time: 8756-4332 OT Individual Time Calculation (min): 63 min     Problem List:  Patient Active Problem List   Diagnosis Date Noted  . Physical deconditioning 09/19/2015  . 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 Debilitated 09/19/2015  . Other congestive heart failure ([place])   . Encephalopathy   . H/O chest tube placement   . LBBB (left bundle branch block)   . Generalized OA   . Prediabetes   . Acute delirium   . LVAD (left ventricular assist device) present ([place])   . Insomnia   . Protein-calorie malnutrition, severe 08/23/2015  . Palliative care encounter   . Goals of care, counseling/discussion   . Acute on chronic systolic (congestive) heart failure ([place]) 08/21/2015  . Acute on chronic systolic CHF (congestive heart failure) ([place]) 08/21/2015  . PVC (premature ventricular contraction) 01/04/2015  . Complete heart block ([place]) 12/07/2012  . Symptomatic bradycardia 11/28/2012  . 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 Chronic systolic heart failure ([place]) 10/27/2012  . Secondary cardiomyopathy ([place]) 10/06/2012  . Left bundle branch block 10/06/2012  . Syncope 10/06/2012    Past Medical History:  Past Medical History  Diagnosis Date  . Nephrolithiasis   . GERD (gastroesophageal reflux disease)   . Insomnia   . Nonischemic cardiomyopathy ([place])     Cardiac catheterization 12/2011 with LVEF 45%, no significant obstructive CAD  . Automatic implantable cardioverter-defibrillator in situ   . H/O hiatal hernia   . Osteoarthritis   . Pneumonia     Dec 2016   Past Surgical History:  Past Surgical History  Procedure Laterality Date  .  Exploratory laparotomy  2006?  Marland Kitchen Appendectomy    . Volvulus reduction  2007  . Posterior fusion cervical spine  2001  . Bi-ventricular implantable cardioverter defibrillator  (crt-d)  12/14/2012  . Cardiac catheterization  2003?; ~ 11/2012  . Knee arthroscopy Left 1990's  . Lithotripsy      "twice" (12/14/2012)  . Cystoscopy w/ stone manipulation      "3-4 times" (12/14/2012)  . Bi-ventricular implantable cardioverter defibrillator N/A 12/14/2012    Procedure: BI-VENTRICULAR IMPLANTABLE CARDIOVERTER DEFIBRILLATOR  (CRT-D);  Surgeon: Deboraha Sprang, MD;  Location: [facility] CATH LAB;  Service: Cardiovascular;  Laterality: N/A;  . Cataract extraction w/phaco Left 05/13/2015    Procedure: CATARACT EXTRACTION PHACO AND INTRAOCULAR LENS PLACEMENT LEFT EYE;  CDE:  3.49;  Surgeon: Williams Che, MD;  Location: [facility] ORS;  Service: Ophthalmology;  Laterality: Left;  . Cataract extraction w/phaco Right 07/22/2015    Procedure: CATARACT EXTRACTION PHACO AND INTRAOCULAR LENS PLACEMENT RIGHT EYE CDE=4.50;  Surgeon: Williams Che, MD;  Location: [facility] ORS;  Service: Ophthalmology;  Laterality: Right;  . Cardiac catheterization N/A 08/22/2015    Procedure: Right/Left Heart Cath and Coronary Angiography;  Surgeon: Jolaine Artist, MD;  Location: [facility] CV LAB;  Service: Cardiovascular;  Laterality: N/A;  . Insertion of implantable left ventricular assist device N/A 08/30/2015    Procedure: INSERTION OF IMPLANTABLE LEFT VENTRICULAR ASSIST DEVICE;  Surgeon: Ivin Poot, MD;  Location: [facility];  Service: Open Heart Surgery;  Laterality: N/A;  . Intraoperative transesophageal echocardiogram N/A 08/30/2015    Procedure: INTRAOPERATIVE TRANSESOPHAGEAL ECHOCARDIOGRAM;  Surgeon: Ivin Poot, MD;  Location: [facility] OR;  Service: Open Heart Surgery;  Laterality: N/A;    Assessment & Plan Clinical Impression: Patient is a 75 y.o. right handed male with history of cardiomyopathy and left bundle branch block. Per chart review  patient is married and independent prior to admission. 2 level home with bedroom on main floor. 3 steps to entry. Family can assist as needed. Presented 08/21/2015 through the heart failure clinic with dizziness and low volume output. 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 Found to be in cardiogenic shock. Workup per cardiology services and cardiothoracic surgery patient underwent LVAD 08/30/2015 per Dr. Lucianne Lei trigt. He was extubated 08/31/2015. Received chest tube placement 09/12/2015 for left pleural effusion drained 1650 mL initially. Bouts of confusion with therapies initiated. Placed on Remeron and Celexa for mood stabilization. Patient presently maintained on aspirin and Coumadin therapy. Acute on chronic anemia 8.5 and monitored. Physical and occupational therapy evaluations completed and ongoing with recommendations of physical medicine rehabilitation consult.   Patient transferred to CIR on 09/19/2015 .    Patient currently requires mod with basic self-care skills secondary to muscle weakness, decreased cardiorespiratoy endurance, decreased attention, decreased awareness, decreased problem solving, decreased safety awareness and decreased memory and decreased standing balance, decreased postural control, decreased balance strategies and difficulty maintaining precautions.  Prior to hospitalization, patient could complete ADLs and IADLs with independent .  Patient will benefit from skilled intervention to increase independence with basic self-care skills prior to discharge home with care partner.  Anticipate patient will require 24 hour supervision and follow up home health.  OT - End of Session Activity Tolerance: Tolerates 30+ min activity with multiple rests Endurance Deficit: Yes Endurance Deficit Description: requires multiple rest breaks OT Assessment Rehab Potential (ACUTE ONLY): Good OT Patient demonstrates impairments in the following area(s):  Balance;Cognition;Endurance;Pain;Safety OT Basic ADL's Functional Problem(s): Grooming;Bathing;Dressing;Toileting OT Transfers Functional Problem(s): Toilet OT Plan OT Intensity: Minimum of 1-2 x/day, 45 to 90 minutes OT Frequency: 5 out of 7 days OT Duration/Estimated Length of Stay: 10-12 days OT Treatment/Interventions: Balance/vestibular training;Cognitive remediation/compensation;Community reintegration;Discharge planning;Disease mangement/prevention;DME/adaptive equipment instruction;Functional mobility training;Pain management;Patient/family education;Psychosocial support;Self Care/advanced ADL retraining;Skin care/wound managment;Therapeutic Activities;Therapeutic Exercise OT Self Feeding Anticipated Outcome(s): no goal OT Basic Self-Care Anticipated Outcome(s): Mod I - supervision OT Toileting Anticipated Outcome(s): Mod I OT Bathroom Transfers Anticipated Outcome(s): Mod I OT Recommendation Patient destination: Home Follow Up Recommendations: Home health OT Equipment Recommended: To be determined Equipment Details: Pt owns a DME business and should have access to all recommended DME   Skilled Therapeutic Intervention OT eval completed with discussion of rehab process, OT purpose, POC, ELOS, and goals.  ADL assessment completed at sit > stand level at sink with focus on adhering to sternal precautions during sit > stand and bathing and dressing tasks.  Noted cognitive deficits with difficulty dividing attention, recall of precautions, and repeating stories during session.  Mod assist sit > stand without use of UE due to sternal precautions.  Educated on functional implementations of sternal precautions when completing UB dressing.    OT Evaluation Precautions/Restrictions  Precautions Precautions: Sternal Precaution Comments: LVAD, Call VAD Pager [phone]. Flow less than 2.5, or sustained increase or decrease of 2.0 L Restrictions Weight Bearing Restrictions: Yes RUE Weight  Bearing: Non weight bearing LUE Weight Bearing: Non weight bearing Pain Pain Assessment Pain Assessment: No/denies pain Home Living/Prior Functioning Home Living Living Arrangements: Spouse/significant other Available Help at Discharge: Family, Available 24 hours/day Type of Home: House Home Access: Stairs to enter  Entrance Stairs-Number of Steps: 3-4 Entrance Stairs-Rails: Left, Right, Can reach both Home Layout: Two level, Able to live on main level with bedroom/bathroom Bathroom Shower/Tub: Walk-in shower Additional Comments: Pt has almost every home equipment imaginable as 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  Was beginning to feel SOB after ambulating ~300-400 ft. ADL  See Function Navigator Vision/Perception  Vision- History Baseline Vision/History: Cataracts (had cataract surgery recently) Patient Visual Report: No change from baseline Vision- Assessment Vision Assessment?: No apparent visual deficits  Cognition Overall Cognitive Status: Impaired/Different from baseline Arousal/Alertness: Awake/alert Orientation Level: Person;Place;Situation Person: Oriented Place: Oriented Situation: Oriented Year: 2017 Month: August Day of Week: Incorrect (Wednesday or Thursday) Memory: Impaired Memory Impairment: Decreased short term memory;Retrieval deficit Immediate Memory Recall: Sock;Blue;Bed Memory Recall: Blue;Bed Memory Recall Blue: With Cue Memory Recall Bed: With Cue Attention: Sustained Sustained Attention: Appears intact Awareness: Impaired Problem Solving: Impaired Sensation Sensation Light  Touch: Appears Intact Stereognosis: Not tested Hot/Cold: Not tested Proprioception: Appears Intact Coordination Fine Motor Movements are Fluid and Coordinated: Yes Extremity/Trunk Assessment RUE Assessment RUE Assessment: Within Functional Limits (strength grossly 4/5, guarded throughout due to sternal precautions) LUE Assessment LUE Assessment: Within Functional Limits (strength grossly 4/5, guarded throughout due to sternal precautions)   See Function Navigator for Current Functional Status.   Refer to Care Plan for Long Term Goals  Recommendations for other services: None  Discharge Criteria: Patient will be discharged from OT if patient refuses treatment 3 consecutive times without medical reason, if treatment goals not met, if there is a change in medical status, if patient makes no progress towards goals or if patient is discharged from hospital.  The above assessment, treatment plan, treatment alternatives and goals were discussed and mutually agreed upon: by patient and by family  Ellwood Dense [facility] 09/20/2015, 12:29 PM

## 2015-09-20 NOTE — Progress Notes (Addendum)
ANTICOAGULATION CONSULT NOTE - Follow Up Consult  Pharmacy Consult for Coumadin Indication: LVAD   Labs:  Recent Labs  09/18/15 0450  09/18/15 1204 09/19/15 0453 09/20/15 0810  HGB  --   < > 8.4* 8.5* 9.0*  HCT  --   --  28.3* 28.5* 29.6*  PLT  --   --  178 176 182  LABPROT 22.1*  --   --  23.5* 26.4*  INR 1.95*  --   --  2.12* 2.47*  CREATININE  --   --  0.73 0.79 0.86  < > = values in this interval not displayed.  Estimated Creatinine Clearance: 83.6 mL/min (by C-G formula based on Cr of 0.86).  Assessment: 74yom s/p LVAD 6/23 continues on coumadin (started low dose per MD on 6/24, pharmacy dosing starting 7/3). INR trended up to 3.38 and doses held 7/4 and 7/5.   INR now trending up and is at goal this am 2.4. Continues on po amiodarone. Will reduce warfarin dose for tonight.  LDH stable 196  Goal of Therapy:  INR 2-2.5 Monitor platelets by anticoagulation protocol: Yes   Plan:  1) Coumadin 2.5 mg tonight 2) Daily INR  Sheppard Coil PharmD., BCPS Clinical Pharmacist Pager [phone] 09/20/2015 10:09 AM

## 2015-09-20 NOTE — Progress Notes (Signed)
Patient information reviewed and entered into eRehab system by Jaeli Grubb, RN, CRRN, PPS Coordinator.  Information including medical coding and functional independence measure will be reviewed and updated through discharge.     Per nursing patient was given "Data Collection Information Summary for Patients in Inpatient Rehabilitation Facilities with attached "Privacy Act Statement-Health Care Records" upon admission.  

## 2015-09-20 NOTE — Care Management Note (Signed)
Inpatient Rehabilitation Center Individual Statement of Services  Patient Name:  [name]  Date:  09/20/2015  Welcome to the Inpatient Rehabilitation Center.  Our goal is to provide you with an individualized program based on your diagnosis and situation, designed to meet your specific needs.  With this comprehensive rehabilitation program, you will be expected to participate in at least 3 hours of rehabilitation therapies Monday-Friday, with modified therapy programming on the weekends.  Your rehabilitation program will include the following services:  Physical Therapy (PT), Occupational Therapy (OT), Speech Therapy (ST), 24 hour per day rehabilitation nursing, Neuropsychology, Case Management (Social Worker), Rehabilitation Medicine, Nutrition Services and Pharmacy Services  Weekly team conferences will be held on Wednesday to discuss your progress.  Your Social Worker will talk with you frequently to get your input and to update you on team discussions.  Team conferences with you and your family in attendance may also be held.  Expected length of stay: 10-12 days Overall anticipated outcome: supervision with cueing  Depending on your progress and recovery, your program may change. Your Social Worker will coordinate services and will keep you informed of any changes. Your Social Worker's name and contact numbers are listed  below.  The following services may also be recommended but are not provided by the Inpatient Rehabilitation Center:   Driving Evaluations  Home Health Rehabiltiation Services  Outpatient Rehabilitation Services  Vocational Rehabilitation   Arrangements will be made to provide these services after discharge if needed.  Arrangements include referral to agencies that provide these services.  Your insurance has been verified to be:  Medicare & BCBS Your primary doctor is:  Donzetta Sprung  Pertinent information will be shared with your doctor and your insurance  company.  Social Worker:  Dossie Der, SW [phone] or [phone]  Information discussed with and copy given to patient by: Lucy Chris, 09/20/2015, 11:22 AM

## 2015-09-20 NOTE — Progress Notes (Signed)
HeartMate 2 Rounding Note  Subjective:    75 y/o with LMNA cardiomyopathy admitted 6/14 with low output HF  Underwent cath 6/15. Normal cors. EF 10% with low cardiac output.   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   Foley placed 7/3 for urinary retention. S/P CT with 1700 cc fluid out.   Ramp echo 7/10 with increase in speed to 9400 rpm.   Feeling OK this morning. Tired as he just returned from therapy. Has not been eating or drinking much. Low appetite.   LVAD INTERROGATION:  HeartMate II LVAD:  Flow 4.0 liters/min, speed 9400, power 5.2, PI 2.1. 6-8 PI events   Objective:    Vital Signs:   Temp:  [97.6 F (36.4 C)-98.8 F (37.1 C)] 98.6 F (37 C) (07/14 0505) Pulse Rate:  [55-60] 55 (07/14 0505) Resp:  [16-18] 16 (07/14 0505) BP: (74-86)/(55-74) 74/55 mmHg (07/14 0505) SpO2:  [97 %-100 %] 97 % (07/14 0505) Weight:  [170 lb 6.7 oz (77.3 kg)-179 lb 10.8 oz (81.5 kg)] 170 lb 6.7 oz (77.3 kg) (07/14 0505) Last BM Date: 09/17/15 Mean arterial Pressure 60-70s  Intake/Output:   Intake/Output Summary (Last 24 hours) at 09/20/15 1128 Last data filed at 09/20/15 1100  Gross per 24 hour  Intake    140 ml  Output   2500 ml  Net  -2360 ml     Physical Exam: General: In the chair. NAD.  HEENT: normal  Neck: supple. JVP 6-7  Cor: LVAD hum.  Lungs: RLL LLL decreased Abdomen: soft, NT, ND. No HSM. Bowel sound present. Driveline site ok. Securement device in place.  Extremities: no cyanosis, clubbing, rash, R and LLE 1 + edema. RUE PICC Neuro: Alert and oriented to person and place GU: Condom Cath   Telemetry: Sinus with PVCs  Labs: Basic Metabolic Panel:  Recent Labs Lab 09/16/15 0415 09/17/15 0425 09/18/15 1204 09/19/15 0453 09/20/15 0810  NA 137 139 139 139 138  K 4.0 3.9 3.5 4.1 3.8  CL 107 106 109 107 103  CO2 26 27 26 27 28   GLUCOSE 98 98 135* 85 95  BUN 9 11 10 9 9   CREATININE 0.77 0.77 0.73 0.79 0.86  CALCIUM 8.4* 8.6* 8.2* 8.4* 8.6*    Liver Function  Tests:  Recent Labs Lab 09/20/15 0810  AST 19  ALT 22  ALKPHOS 67  BILITOT 0.8  PROT 5.5*  ALBUMIN 2.5*   No results for input(s): LIPASE, AMYLASE in the last 168 hours. No results for input(s): AMMONIA in the last 168 hours.  CBC:  Recent Labs Lab 09/16/15 0415 09/17/15 0425 09/18/15 1204 09/19/15 0453 09/20/15 0810  WBC 4.7 4.1 4.4 4.1 5.0  NEUTROABS  --   --   --   --  3.4  HGB 8.6* 8.3* 8.4* 8.5* 9.0*  HCT 28.6* 28.3* 28.3* 28.5* 29.6*  MCV 86.7 87.1 86.5 86.1 86.0  PLT 200 190 178 176 182    INR:  Recent Labs Lab 09/16/15 0415 09/17/15 0425 09/18/15 0450 09/19/15 0453 09/20/15 0810  INR 1.94* 1.81* 1.95* 2.12* 2.47*    Other results:  EKG:   Imaging:  No results found.   Medications:     Scheduled Medications: . amiodarone  200 mg Oral BID  . aspirin EC  81 mg Oral Daily  . bisacodyl  10 mg Oral Daily   Or  . bisacodyl  10 mg Rectal Daily  . citalopram  10 mg Oral Daily  . feeding supplement  1 Container  Oral TID BM  . ferrous fumarate-b12-vitamic C-folic acid  1 capsule Oral TID PC  . mirtazapine  15 mg Oral QHS  . OLANZapine  5 mg Oral QHS  . pantoprazole  40 mg Oral Daily  . sodium chloride flush  10-40 mL Intracatheter Q12H  . spironolactone  25 mg Oral Daily  . tamsulosin  0.4 mg Oral Daily  . warfarin  4 mg Oral ONCE-1800  . Warfarin - Pharmacist Dosing Inpatient   Does not apply q1800     Infusions:     PRN Medications:  acetaminophen, docusate sodium, ondansetron **OR** ondansetron (ZOFRAN) IV, polyvinyl alcohol, sodium chloride flush, sorbitol, traMADol   Assessment:   1. 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 EF 15%. Cath 8/14 with normal cors. 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  2. Frequent PVCs 3. Severe Malnutrition- Prealbumin 14.7 on 6/20 4. Acute post-op delirium/sundowning 5. Urinary Retention requiring Foley 6. Left pleural effusion s/p chest tube  Plan/Discussion:     Mental status continues to improve. Continue Zyprexa and Celexa.  CO-OX 72% 09/19/15.   Weight shows down 9 lbs ? Accuracy.  PI into 2s, usually 4-5.  Will hold po lasix today.  Likely resume tomorrow. MAP 70s. Continue spiro 25 mg daily. Replace [facility]. Add back daily BMETs.   INR 2.47. Pharmacy dosing coumadin.   Foley out 09/18/15. No post void residual. Continue condom cath. Continue Flomax.   VAD parameters reviewed. Isolated power spike noted (10.4). Event log was reviewed thought to be pump burn in. INR and LDH stable.   Appreciate CIR and input.  Continue therapy.   I reviewed the LVAD parameters from today, and compared the results to the patient's prior recorded data.  No programming changes were made.  The LVAD is functioning within specified parameters.  The patient performs LVAD self-test daily.  LVAD interrogation was negative for any significant power changes, alarms or PI events/speed drops.  LVAD equipment check completed and is in good working order.  Back-up equipment present.   LVAD education done on emergency procedures and precautions and reviewed exit site care.  Length of Stay: 1  Luane School 09/20/2015, 11:28 AM  VAD Team --- VAD ISSUES ONLY--- Pager [phone] (7am - 7am)  Advanced Heart Failure Team  Pager [phone] (M-F; 7a - 4p)  Please contact [facility] for night-coverage after hours (4p -7a ) and weekends on [email or web address]  Patient seen with PA, agree with the above note.  PI lower, poor po intake.  Stop Lasix today.  Still with periodic confusion.  Continue rehab work, has a way to go.   Marca Ancona 09/20/2015

## 2015-09-21 ENCOUNTER — Inpatient Hospital Stay (HOSPITAL_COMMUNITY): Payer: Medicare Other | Admitting: Occupational Therapy

## 2015-09-21 ENCOUNTER — Inpatient Hospital Stay (HOSPITAL_COMMUNITY): Payer: Medicare Other | Admitting: *Deleted

## 2015-09-21 ENCOUNTER — Inpatient Hospital Stay (HOSPITAL_COMMUNITY): Payer: Medicare Other | Admitting: Speech Pathology

## 2015-09-21 DIAGNOSIS — I5022 Chronic systolic (congestive) heart failure: Secondary | ICD-10-CM

## 2015-09-21 LAB — BASIC METABOLIC PANEL
ANION GAP: 7 (ref 5–15)
BUN: 10 mg/dL (ref 6–20)
CALCIUM: 8.9 mg/dL (ref 8.9–10.3)
CHLORIDE: 104 mmol/L (ref 101–111)
CO2: 30 mmol/L (ref 22–32)
CREATININE: 0.88 mg/dL (ref 0.61–1.24)
GFR calc non Af Amer: >60 mL/min (ref >60)
Glucose, Bld: 90 mg/dL (ref 65–99)
Potassium: 3.9 mmol/L (ref 3.5–5.1)
SODIUM: 141 mmol/L (ref 135–145)

## 2015-09-21 LAB — PROTIME-INR
INR: 3 — AB (ref 0.00–1.49)
PROTHROMBIN TIME: 30.6 s — AB (ref 11.6–15.2)

## 2015-09-21 LAB — LACTATE DEHYDROGENASE: LDH: 266 U/L — ABNORMAL HIGH (ref 98–192)

## 2015-09-21 NOTE — Progress Notes (Signed)
Occupational Therapy Session Note  Patient Details  Name: [name] MRN: [id number] Date of Birth: 1941/01/11  Today's Date: 09/21/2015 OT Individual Time: 0865-7846 and 9629-5284 OT Individual Time Calculation (min): 60 min and 50 min   Short Term Goals: Week 1:  OT Short Term Goal 1 (Week 1): Pt will complete UB dressing with supervision while adhering to sternal precautions with min cues OT Short Term Goal 2 (Week 1): Pt will complete toilet transfers with min assist while adhering to sternal precautions with min cues OT Short Term Goal 3 (Week 1): Pt will complete LB dressing with min assist at sit > stand level while adhering to sternal precautions OT Short Term Goal 4 (Week 1): Pt will complete bathing with min assist at sit > stand levle while adhering to sternal precautions OT Short Term Goal 5 (Week 1): Pt will tolerate standing for 4 mins while completing grooming tasks with supervision  Skilled Therapeutic Interventions/Progress Updates:   1) Treatment session with focus on activity tolerance, sequencing, orientation, and recall and adherence to sternal precautions during mobility and self-care tasks.  Pt received in bed expressing hesitancy to complete bathing and dressing tasks as he reportedly did not sleep well.  After some encouragement, pt willing to participate in self-care tasks at sink.  Pt's wife reports that pt continues to be confused reporting he has done tasks he hasn't or has not done tasks that he has.  Pt with awareness of precautions regarding LVAD and sternal but unsure of specifics.  Therapist provided cues for sequencing during bathing and orientation of clothing when donning shirt and underwear as pt attempted to don head through head hole upside down and then threading underwear upside down as well.  Pt did demonstrate increased problem solving and awareness with crossing leg over opposite knee to increase success with donning underwear and socks.  Mod -  max assist sit > stand without use of BUE due to sternal precautions.  Pt's wife present throughout session providing clarification to pt reports as needed.  2) Treatment session with focus on activity tolerance and sit > stand.  Pt received upright in w/c ready to participate in treatment session.  Pt propelled w/c to therapy gym with use of BLE for strengthening and endurance.  Engaged in sit > stand from elevated mat surface to improve technique and increase success with sit > stand.  Lowered mat from 24", to 23", to 22" with pt able to complete sit > stand at 24" with supervision and then min assist with lowering.  Increased cues for weight shift as mat lowered while continuing to utilize "heart" pillow to eliminate use of BUE due to sternal precautions.  Ambulated 90 feet, then 210 feet with RW and min guard with cues for RW placement and safe speed.  Increased cognitive demand with standing task to following verbal sequence of colored clothespins and then naming of food items in alphabetical order with pt requiring mod-max verbal cues.  Returned to room via RW with min guard and cues again for safety.  Left semi-reclined in bed with wife present and assisting with transferring from LVAD batteries to wall power.  Therapy Documentation Precautions:  Precautions Precautions: Sternal Precaution Comments: LVAD, Call VAD Pager [phone]. Flow less than 2.5, or sustained increase or decrease of 2.0 L Restrictions Weight Bearing Restrictions: Yes RUE Weight Bearing: Non weight bearing LUE Weight Bearing: Non weight bearing General:   Vital Signs: Therapy Vitals Temp: 98.6 F (37 C) Temp Source:  Oral Pulse Rate: (!) 59 Resp: 17 Oxygen Therapy SpO2: 98 % O2 Device: Not Delivered Pain:  Pt with no c/o pain  See Function Navigator for Current Functional Status.   Therapy/Group: Individual Therapy  Olumide Dolinger 09/21/2015, 10:00 AM

## 2015-09-21 NOTE — Progress Notes (Signed)
[name] is a 75 y.o. male 26-Apr-1940 [id number]  Subjective: No new complaints, still tired. No new problems. Slept well. Feeling OK.  Objective: Vital signs in last 24 hours: Temp:  [98.4 F (36.9 C)-98.6 F (37 C)] 98.6 F (37 C) (07/15 0635) Pulse Rate:  [59] 59 (07/15 0635) Resp:  [17-18] 17 (07/15 0635) SpO2:  [98 %-100 %] 98 % (07/15 0635) Weight:  [76.295 kg (168 lb 3.2 oz)] 76.295 kg (168 lb 3.2 oz) (07/15 0635) Weight change: -5.205 kg (-11 lb 7.6 oz) Last BM Date: 09/20/15  Intake/Output from previous day: 07/14 0701 - 07/15 0700 In: 520 [P.O.:480; I.V.:40] Out: 1400 [Urine:1400]  Physical Exam General: No apparent distress   In [facility] Wife at side Lungs: Normal effort. Lungs clear to auscultation, no crackles or wheezes. Cardiovascular: Regular rate and rhythm, no edema Neurological: No neurological deficits   Lab Results: BMET    Component Value Date/Time   NA 141 09/21/2015 0505   K 3.9 09/21/2015 0505   CL 104 09/21/2015 0505   CO2 30 09/21/2015 0505   GLUCOSE 90 09/21/2015 0505   BUN 10 09/21/2015 0505   CREATININE 0.88 09/21/2015 0505   CALCIUM 8.9 09/21/2015 0505   GFRNONAA >60 09/21/2015 0505   GFRAA >60 09/21/2015 0505   CBC    Component Value Date/Time   WBC 5.0 09/20/2015 0810   RBC 3.44* 09/20/2015 0810   HGB 9.0* 09/20/2015 0810   HCT 29.6* 09/20/2015 0810   PLT 182 09/20/2015 0810   MCV 86.0 09/20/2015 0810   MCH 26.2 09/20/2015 0810   MCHC 30.4 09/20/2015 0810   RDW 17.1* 09/20/2015 0810   LYMPHSABS 0.9 09/20/2015 0810   MONOABS 0.3 09/20/2015 0810   EOSABS 0.5 09/20/2015 0810   BASOSABS 0.0 09/20/2015 0810   CBG's (last 3):   Recent Labs  09/18/15 2056 09/19/15 0623 09/19/15 1131  GLUCAP 183* 106* 133*   LFT's Lab Results  Component Value Date   ALT 22 09/20/2015   AST 19 09/20/2015   ALKPHOS 67 09/20/2015   BILITOT 0.8 09/20/2015    Studies/Results: No results found.  Medications:  I have reviewed  the patient's current medications. Scheduled Medications: . amiodarone  200 mg Oral BID  . aspirin EC  81 mg Oral Daily  . bisacodyl  10 mg Oral Daily   Or  . bisacodyl  10 mg Rectal Daily  . citalopram  10 mg Oral Daily  . feeding supplement  1 Container Oral TID BM  . ferrous fumarate-b12-vitamic C-folic acid  1 capsule Oral TID PC  . mirtazapine  15 mg Oral QHS  . OLANZapine  5 mg Oral QHS  . pantoprazole  40 mg Oral Daily  . sodium chloride flush  10-40 mL Intracatheter Q12H  . spironolactone  25 mg Oral Daily  . tamsulosin  0.4 mg Oral Daily  . Warfarin - Pharmacist Dosing Inpatient   Does not apply q1800   PRN Medications: acetaminophen, docusate sodium, ondansetron **OR** ondansetron (ZOFRAN) IV, polyvinyl alcohol, sodium chloride flush, sorbitol, traMADol  Assessment/Plan: Active Problems:   Debilitated  1. 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 DVT Prophylaxis/Anticoagulation: Chronic Coumadin therapy. Monitor for any bleeding episodes 3. Pain Management: Ultram as needed 4. Mood: Remeron 15 mg daily at bedtime, Zyprexa 5 mg daily at bedtime, Celexa 10 mg daily 5. Neuropsych: This patient is capable of making decisions  on his own behalf. 6. Skin/Wound Care: Routine skin checks 7. Fluids/Electrolytes/Nutrition: Routine I&O's with follow-up chemistries 8. Acute blood loss anemia. Follow-up CBC, Hemoglobins running in the 8 range 9. Hypertension/atrial fibrillation. Amiodarone 200 mg twice a day, Aldactone 25 mg daily. Monitor with increased mobility 10. BPH. Flomax 0.4 mg daily. Check PVR 3 11. Decreased nutritional storage. Follow-up dietary consult 12. Memory deficits, per wife this was not a pre-existing problem. May have some element of encephalopathy associated with his acute illness. monitor  Length of stay, days: 2   Valerie A.  Felicity Coyer, MD 09/21/2015, 11:51 AM

## 2015-09-21 NOTE — Progress Notes (Signed)
ANTICOAGULATION CONSULT NOTE - Follow Up Consult  Pharmacy Consult for Warfarin Indication: LVAD  Allergies  Allergen Reactions  . Phenergan [Promethazine Hcl] Nausea And Vomiting  . Lasix [Furosemide] Other (See Comments)    "Dropped blood pressure too low"  . Morphine And Related Other (See Comments)    Goes crazy     Patient Measurements: Weight: 168 lb 3.2 oz (76.295 kg)  Vital Signs: Temp: 98.6 F (37 C) (07/15 0635) Temp Source: Oral (07/15 0635) Pulse Rate: 59 (07/15 0635)  Labs:  Recent Labs  09/19/15 0453 09/20/15 0810 09/21/15 0505  HGB 8.5* 9.0*  --   HCT 28.5* 29.6*  --   PLT 176 182  --   LABPROT 23.5* 26.4* 30.6*  INR 2.12* 2.47* 3.00*  CREATININE 0.79 0.86 0.88    Estimated Creatinine Clearance: 80.7 mL/min (by C-G formula based on Cr of 0.88).  Assessment: [name] s/p LVAD 6/23 continues on coumadin (started low dose per MD on 6/24, pharmacy dosing starting 7/3). INR is supratherapeutic and trending up, likely due to higher doses earlier in the week and poor intake. Amiodarone continues, and effects on INR should be realized. No reports of bleeding noted.   H&H is low but stable. Platelets wnl.  Given INR trending up and patient's decreased intake, will hold dose tonight.  Goal of Therapy:  INR 2-2.5   Plan:  - HOLD warfarin dose tonight.  - Monitor daily INR while inpatient.  - Monitor for x/sx of bleeding.   Carylon Perches, PharmD Acute Care Pharmacy Resident  Pager: [phone] 09/21/2015
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
## 2015-09-21 NOTE — Progress Notes (Signed)
Speech Language Pathology Daily Session Note  Patient Details  Name: [name] MRN: [id number] Date of Birth: Sep 02, 1940  Today's Date: 09/21/2015 SLP Individual Time: 1300-1330 SLP Individual Time Calculation (min): 30 min  Short Term Goals: Week 1: SLP Short Term Goal 1 (Week 1): Pt will sustain his attention to a basic, familiar tasks for 3 minutes with min verbal cues for redirection.  SLP Short Term Goal 2 (Week 1): Pt will complete a basic, familiar task with min assist verbal cues for functional problem solving.   SLP Short Term Goal 3 (Week 1): Pt will utilize external aids to orient to date and situation with min assist verbal cues.    Skilled Therapeutic Interventions: Skilled treatment session focused on cognition goals. SLP facilitated session by providing Mod A verbal cues for completing  A basic, familiar task. Pt required Max A verbal cues for use of external aids for orientation of date. Despite cues, pt stated that the calendar needed to be changed to month of June. Pt with Mod A verbal cues for appropriate attention to basic familiar tasks. Pt perseverated on being "almost 75 years old" and doing "well for his age." Pt returned to his room, left upright in wheelchair and in the care of his wife. All needs left within reach. Continue current plan of care.   Function:    Cognition Comprehension Comprehension assist level: Understands basic 90% of the time/cues < 10% of the time  Expression   Expression assist level: Expresses basic 90% of the time/requires cueing < 10% of the time.  Social Interaction Social Interaction assist level: Interacts appropriately 90% of the time - Needs monitoring or encouragement for participation or interaction.  Problem Solving Problem solving assist level: Solves basic 50 - 74% of the time/requires cueing 25 - 49% of the time  Memory Memory assist level: Recognizes or recalls 25 - 49% of the time/requires cueing 50 - 75% of the time     Pain    Therapy/Group: Individual Therapy  [name] 09/21/2015, 2:07 PM

## 2015-09-21 NOTE — Progress Notes (Signed)
Physical Therapy Session Note  Patient Details  Name: [name] MRN: [id number] Date of Birth: 03/11/1940  Today's Date: 09/21/2015 PT Individual Time: 1110-1213 PT Individual Time Calculation (min): 63 min   Short Term Goals: Week 1:  PT Short Term Goal 1 (Week 1): Pt will perform bed mobility on flat bed with min A while maintaining sternal precautions PT Short Term Goal 2 (Week 1): Pt will perform sit <> stand and stand pivot transfers with min A from various heights of surfaces while maintaining sternal precautions PT Short Term Goal 3 (Week 1): Pt will perform gait x 100' with RW and min A  PT Short Term Goal 4 (Week 1): Pt will negotiate 4 stairs (6 inches) with 2 rails with min A   Skilled Therapeutic Interventions/Progress Updates:    Tx focused on functional mobility training within precautions, gait with RW, car transfer, therex for strengthening and 5x sit-to-stand test.  Pt up in recliner, but scooted out to end unsafely with wife present. PT assisted pt back up to safe sitting position and procured 2 pressure relief cushions for WC and recliner to reduce tendency to scoot due to discomfort. Spouse performed LVAD management safely prior to exiting room.   Therapeutic activity Sit<>stand x10 throughout tx with up to Max A for lifting and lowering and max verbal cues for no UE support; pillow helped as reminder. Pt unable to verbalize any precautions, but wife was well aware.  Stand-step transfer with up to Max A to control balance due to posterior lean.   Pt propelled WC x150' with bil LEs only for strengthening.  Seated therex including each of the following 2x10: LAQ and marching.   5/17 5x STS 51sec from 22" mat with up to mod A  Car transfer with Mod A and cues for sequence.   Gait with RW 2x120' with Min A for steadying due to narrow BOS and lateral instability. Pt needed cues for positioning in RW and safety during turns.   Standing balance trainin gon wedge  to correct posterior lean x2 min with Min A overall.   Pt left up in [facility] with wife present and all needs in reach.  Therapy Documentation Precautions:  Precautions Precautions: Sternal Precaution Comments: LVAD, Call VAD Pager [phone]. Flow less than 2.5, or sustained increase or decrease of 2.0 L Restrictions Weight Bearing Restrictions: Yes RUE Weight Bearing: Non weight bearing LUE Weight Bearing: Non weight bearing   Pain: none   See Function Navigator for Current Functional Status.   Therapy/Group: Individual Therapy   Clydene Laming, PT, DPT   Eulogio Ditch, Richardson Dopp M 09/21/2015, 12:08 PM

## 2015-09-22 ENCOUNTER — Inpatient Hospital Stay (HOSPITAL_COMMUNITY): Payer: Medicare Other | Admitting: Physical Therapy

## 2015-09-22 DIAGNOSIS — I5022 Chronic systolic (congestive) heart failure: Secondary | ICD-10-CM | POA: Diagnosis not present

## 2015-09-22 LAB — CBC
HEMATOCRIT: 27.1 % — AB (ref 39.0–52.0)
Hemoglobin: 8.3 g/dL — ABNORMAL LOW (ref 13.0–17.0)
MCH: 26.2 pg (ref 26.0–34.0)
MCHC: 30.6 g/dL (ref 30.0–36.0)
MCV: 85.5 fL (ref 78.0–100.0)
PLATELETS: 158 10*3/uL (ref 150–400)
RBC: 3.17 MIL/uL — ABNORMAL LOW (ref 4.22–5.81)
RDW: 17.1 % — AB (ref 11.5–15.5)
WBC: 3.9 10*3/uL — AB (ref 4.0–10.5)

## 2015-09-22 LAB — BASIC METABOLIC PANEL
Anion gap: 3 — ABNORMAL LOW (ref 5–15)
BUN: 10 mg/dL (ref 6–20)
CALCIUM: 8.7 mg/dL — AB (ref 8.9–10.3)
CO2: 28 mmol/L (ref 22–32)
CREATININE: 0.91 mg/dL (ref 0.61–1.24)
Chloride: 108 mmol/L (ref 101–111)
GFR calc Af Amer: >60 mL/min (ref >60)
GLUCOSE: 94 mg/dL (ref 65–99)
POTASSIUM: 3.8 mmol/L (ref 3.5–5.1)
SODIUM: 139 mmol/L (ref 135–145)

## 2015-09-22 LAB — LACTATE DEHYDROGENASE: LDH: 188 U/L (ref 98–192)

## 2015-09-22 LAB — PROTIME-INR
INR: 2.95 — AB (ref 0.00–1.49)
Prothrombin Time: 30.2 seconds — ABNORMAL HIGH (ref 11.6–15.2)

## 2015-09-22 MED ORDER — WARFARIN SODIUM 1 MG PO TABS
1.0000 mg | ORAL_TABLET | Freq: Once | ORAL | Status: AC
  Administered 2015-09-22: 1 mg via ORAL
  Filled 2015-09-22: qty 1

## 2015-09-22 NOTE — Progress Notes (Signed)
[name] is a 75 y.o. male 1940-08-04 [id number]  Subjective: No new complaints, requests hall pass to go outside with family later today. No pain or SOB. Mild swelling R foot  Objective: Vital signs in last 24 hours: Temp:  [98.5 F (36.9 C)] 98.5 F (36.9 C) (07/16 0645) Pulse Rate:  [60] 60 (07/16 0645) Resp:  [18] 18 (07/16 0645) SpO2:  [96 %] 96 % (07/16 0645) Weight:  [75.841 kg (167 lb 3.2 oz)] 75.841 kg (167 lb 3.2 oz) (07/16 0645) Weight change: -0.454 kg (-1 lb) Last BM Date: 09/20/15  Intake/Output from previous day: 07/15 0701 - 07/16 0700 In: 600 [P.O.:600] Out: 1125 [Urine:1125]  Physical Exam General: No apparent distress   In [facility]  Lungs: Normal effort. Lungs clear to auscultation, no crackles or wheezes. Cardiovascular: Regular rate and rhythm, tr R pedal edema Neurological: No neurological deficits   Lab Results: BMET    Component Value Date/Time   NA 139 09/22/2015 0417   K 3.8 09/22/2015 0417   CL 108 09/22/2015 0417   CO2 28 09/22/2015 0417   GLUCOSE 94 09/22/2015 0417   BUN 10 09/22/2015 0417   CREATININE 0.91 09/22/2015 0417   CALCIUM 8.7* 09/22/2015 0417   GFRNONAA >60 09/22/2015 0417   GFRAA >60 09/22/2015 0417   CBC    Component Value Date/Time   WBC 3.9* 09/22/2015 0417   RBC 3.17* 09/22/2015 0417   HGB 8.3* 09/22/2015 0417   HCT 27.1* 09/22/2015 0417   PLT 158 09/22/2015 0417   MCV 85.5 09/22/2015 0417   MCH 26.2 09/22/2015 0417   MCHC 30.6 09/22/2015 0417   RDW 17.1* 09/22/2015 0417   LYMPHSABS 0.9 09/20/2015 0810   MONOABS 0.3 09/20/2015 0810   EOSABS 0.5 09/20/2015 0810   BASOSABS 0.0 09/20/2015 0810   CBG's (last 3):    Recent Labs  09/19/15 1131  GLUCAP 133*   LFT's Lab Results  Component Value Date   ALT 22 09/20/2015   AST 19 09/20/2015   ALKPHOS 67 09/20/2015   BILITOT 0.8 09/20/2015    Studies/Results: No results found.  Medications:  I have reviewed the patient's current  medications. Scheduled Medications: . amiodarone  200 mg Oral BID  . aspirin EC  81 mg Oral Daily  . bisacodyl  10 mg Oral Daily   Or  . bisacodyl  10 mg Rectal Daily  . citalopram  10 mg Oral Daily  . feeding supplement  1 Container Oral TID BM  . ferrous fumarate-b12-vitamic C-folic acid  1 capsule Oral TID PC  . mirtazapine  15 mg Oral QHS  . OLANZapine  5 mg Oral QHS  . pantoprazole  40 mg Oral Daily  . sodium chloride flush  10-40 mL Intracatheter Q12H  . spironolactone  25 mg Oral Daily  . tamsulosin  0.4 mg Oral Daily  . Warfarin - Pharmacist Dosing Inpatient   Does not apply q1800   PRN Medications: acetaminophen, docusate sodium, ondansetron **OR** ondansetron (ZOFRAN) IV, polyvinyl alcohol, sodium chloride flush, sorbitol, traMADol  Assessment/Plan: Active Problems:   Debilitated  1. 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 DVT Prophylaxis/Anticoagulation: Chronic Coumadin therapy. Monitor for any bleeding episodes 3. Pain Management: Ultram as needed 4. Mood: Remeron 15 mg daily at bedtime, Zyprexa 5 mg daily at bedtime, Celexa 10 mg daily 5. Neuropsych: This patient is capable of making decisions on his own  behalf. 6. Skin/Wound Care: Routine skin checks 7. Fluids/Electrolytes/Nutrition: Routine I&O's with follow-up chemistries 8. Acute blood loss anemia. Follow-up CBC, Hemoglobins running in the 8 range 9. Hypertension/atrial fibrillation. Amiodarone 200 mg twice a day, Aldactone 25 mg daily. Monitor with increased mobility 10. BPH. Flomax 0.4 mg daily. Check PVR 3 11. Decreased nutritional storage. Follow-up dietary consult 12. Memory deficits, per wife this was not a pre-existing problem. May have some element of encephalopathy associated with his acute illness. monitor  Length of stay, days: 3   Valerie A. Felicity Coyer,  MD 09/22/2015, 10:11 AM

## 2015-09-22 NOTE — Progress Notes (Signed)
ANTICOAGULATION CONSULT NOTE - Follow Up Consult  Pharmacy Consult for Warfarin Indication: LVAD  Allergies  Allergen Reactions  . Phenergan [Promethazine Hcl] Nausea And Vomiting  . Lasix [Furosemide] Other (See Comments)    "Dropped blood pressure too low"  . Morphine And Related Other (See Comments)    Goes crazy     Patient Measurements: Weight: 167 lb 3.2 oz (75.841 kg)  Vital Signs: Temp: 98.5 F (36.9 C) (07/16 0645) Temp Source: Oral (07/16 0645) Pulse Rate: 60 (07/16 0645)  Labs:  Recent Labs  09/20/15 0810 09/21/15 0505 09/22/15 0417  HGB 9.0*  --  8.3*  HCT 29.6*  --  27.1*  PLT 182  --  158  LABPROT 26.4* 30.6* 30.2*  INR 2.47* 3.00* 2.95*  CREATININE 0.86 0.88 0.91    Estimated Creatinine Clearance: 77.5 mL/min (by C-G formula based on Cr of 0.91).  Assessment: 74yom s/p LVAD 6/23 continues on coumadin (started low dose per MD on 6/24, pharmacy dosing starting 7/3). INR is supratherapeutic at 2.95, which is slightly lower after held dose last night. Anticipate continued trend down. Continued poor food intake noted.   Amiodarone continues, and effects on INR should be realized. No reports of bleeding noted.   H&H is low but stable. Platelets wnl.  Given supratherapeutic INR trending down, will give low dose of warfarin 1mg  PO tonight.  Goal of Therapy:  INR 2-2.5   Plan:  - Warfarin 1mg  PO tonight.  - Monitor daily INR while inpatient.  - Monitor for x/sx of bleeding.   Carylon Perches, PharmD Acute Care Pharmacy Resident  Pager: [phone] 09/22/2015

## 2015-09-22 NOTE — Progress Notes (Signed)
Physical Therapy Session Note  Patient Details  Name: [name] MRN: [id number] Date of Birth: April 14, 1940  Today's Date: 09/22/2015 PT Individual Time: 0730-0830 PT Individual Time Calculation (min): 60 min   Short Term Goals: Week 1:  PT Short Term Goal 1 (Week 1): Pt will perform bed mobility on flat bed with min A while maintaining sternal precautions PT Short Term Goal 2 (Week 1): Pt will perform sit <> stand and stand pivot transfers with min A from various heights of surfaces while maintaining sternal precautions PT Short Term Goal 3 (Week 1): Pt will perform gait x 100' with RW and min A  PT Short Term Goal 4 (Week 1): Pt will negotiate 4 stairs (6 inches) with 2 rails with min A   Skilled Therapeutic Interventions/Progress Updates:    Pt received supine in bed, lethargic and difficult to arouse but ultimately agreeable to treatment. All activities require increased time due to lethargy, memory impairments and poor attention. Supine>sit with minA to reduce use of UEs with sternal precautions. Stand pivot transfer attempted with RW however pt has significant difficulty managing RW, coordination and sequencing to turn and sit in chair, with LEs giving out and pt returned totalA to sit EOB. Demonstrated to pt x2 squat pivot transfer; when pt initiated transfer he performed stand pivot maxA to w/c. Seated in w/c, pt performed bathing and dressing with minA overall. Cues for UE placement during sit <>stand, and mod/maxA for standing balance with posterior bias, improved when given task to perform in standing. Requires multimodal cueing throughout for sequencing, attention, problem solving. Remained seated in w/c with wife present and setup for breakfast; all needs in reach.   Therapy Documentation Precautions:  Precautions Precautions: Sternal Precaution Comments: LVAD, Call VAD Pager [phone]. Flow less than 2.5, or sustained increase or decrease of 2.0 L Restrictions Weight  Bearing Restrictions: Yes RUE Weight Bearing: Non weight bearing LUE Weight Bearing: Non weight bearing Pain: Pain Assessment Pain Assessment: Faces Faces Pain Scale: Hurts even more Pain Location:  (unable to determine; "all over") Pain Onset: With Activity Pain Intervention(s): Repositioned   See Function Navigator for Current Functional Status.   Therapy/Group: Individual Therapy  [name] 09/22/2015, 8:33 AM

## 2015-09-22 NOTE — Progress Notes (Signed)
Patient ID: [name], male   DOB: 07-Feb-1941, 75 y.o.   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 Normal cors. EF 10% with low cardiac output.   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   Foley placed 7/3 for urinary retention. S/P CT with 1700 cc fluid out.   Ramp echo 7/10 with increase in speed to 9400 rpm.   Walking with walker with PT.  Gradually getting stronger.  Still eating/drinking poorly, multiple PI events in last 24 hrs.  Sleeping now after PT session.   LVAD INTERROGATION:  HeartMate II LVAD:  Flow 5 liters/min, speed 9400, power 5.7, PI 4. 18 PI events/24 hrs.   Objective:    Vital Signs:   Temp:  [98.5 F (36.9 C)] 98.5 F (36.9 C) (07/16 0645) Pulse Rate:  [60] 60 (07/16 0645) Resp:  [18] 18 (07/16 0645) SpO2:  [96 %] 96 % (07/16 0645) Weight:  [167 lb 3.2 oz (75.841 kg)] 167 lb 3.2 oz (75.841 kg) (07/16 0645) Last BM Date: 09/21/15 Mean arterial Pressure 76  Intake/Output:   Intake/Output Summary (Last 24 hours) at 09/22/15 1225 Last data filed at 09/22/15 0900  Gross per 24 hour  Intake    240 ml  Output    925 ml  Net   -685 ml     Physical Exam: General: In the chair. NAD.  HEENT: normal  Neck: supple. JVP 6-7  Cor: LVAD hum.  Lungs: RLL LLL decreased Abdomen: soft, NT, ND. No HSM. Bowel sound present. Driveline site ok. Securement device in place. Extremities: no cyanosis, clubbing, rash, R and LLE trace edema. RUE PICC Neuro: Alert and oriented to person and place GU: Condom Cath   Labs: Basic Metabolic Panel:  Recent Labs Lab 09/18/15 1204 09/19/15 0453 09/20/15 0810 09/21/15 0505 09/22/15 0417  NA 139 139 138 141 139  K 3.5 4.1 3.8 3.9 3.8  CL 109 107 103 104 108  CO2 GLUCOSE 135* 85 95 90 94  BUN CREATININE 0.73 0.79 0.86 0.88 0.91  CALCIUM 8.2* 8.4* 8.6* 8.9 8.7*    Liver Function  Tests:  Recent Labs Lab 09/20/15 0810  AST 19  ALT 22  ALKPHOS 67  BILITOT 0.8  PROT 5.5*  ALBUMIN 2.5*   No results for input(s): LIPASE, AMYLASE in the last 168 hours. No results for input(s): AMMONIA in the last 168 hours.  CBC:  Recent Labs Lab 09/17/15 0425 09/18/15 1204 09/19/15 0453 09/20/15 0810 09/22/15 0417  WBC 4.1 4.4 4.1 5.0 3.9*  NEUTROABS  --   --   --  3.4  --   HGB 8.3* 8.4* 8.5* 9.0* 8.3*  HCT 28.3* 28.3* 28.5* 29.6* 27.1*  MCV 87.1 86.5 86.1 86.0 85.5  PLT 190 178 176 182 158    INR:  Recent Labs Lab 09/18/15 0450 09/19/15 0453 09/20/15 0810 09/21/15 0505 09/22/15 0417  INR 1.95* 2.12* 2.47* 3.00* 2.95*    Other results:  EKG:   Imaging: No results found.   Medications:     Scheduled Medications: . amiodarone  200 mg Oral BID  . aspirin EC  81 mg Oral Daily  . bisacodyl  10 mg Oral Daily   Or  . bisacodyl  10 mg Rectal Daily  . citalopram  10 mg Oral Daily  . feeding supplement  1 Container Oral TID BM  . ferrous fumarate-b12-vitamic C-folic acid  1 capsule Oral TID PC  . mirtazapine  15 mg Oral QHS  . OLANZapine  5 mg Oral QHS  . pantoprazole  40 mg Oral Daily  . sodium chloride flush  10-40 mL Intracatheter Q12H  . tamsulosin  0.4 mg Oral Daily  . Warfarin - Pharmacist Dosing Inpatient   Does not apply q1800    Infusions:    PRN Medications: acetaminophen, docusate sodium, ondansetron **OR** ondansetron (ZOFRAN) IV, polyvinyl alcohol, sodium chloride flush, sorbitol, traMADol   Assessment:   1. 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 EF 15%. Cath 8/14 with normal cors. 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  2. Frequent PVCs 3. Severe malnutrition- Prealbumin 14.7 on 6/20 4. Acute post-op delirium/sundowning 5. Urinary Retention requiring Foley 6. Left pleural effusion s/p chest tube  Plan/Discussion:    Occasional confusion but generally clear mentally.   Not eating/drinking well,  increased number of PI events.  I asked him to make an effort to push po intake. Stop spironolactone.   Pharmacy dosing coumadin, goal INR 2-2.5.   Isolated power spike noted (10.4) several days ago. Event log was reviewed thought to be pump burn in.  LDH 188 today.   Appreciate CIR and input. Continue therapy.   I reviewed the LVAD parameters from today, and compared the results to the patient's prior recorded data.  No programming changes were made.  The LVAD is functioning within specified parameters.  The patient performs LVAD self-test daily.  LVAD interrogation was negative for any significant power changes, alarms or PI events/speed drops.  LVAD equipment check completed and is in good working order.  Back-up equipment present.   LVAD education done on emergency procedures and precautions and reviewed exit site care.  Length of Stay: 3  Marca Ancona 09/22/2015, 12:25 PM  VAD Team --- VAD ISSUES ONLY--- Pager [phone] (7am - 7am)  Advanced Heart Failure Team  Pager [phone] (M-F; 7a - 4p)  Please contact [facility] for night-coverage after hours (4p -7a ) and weekends on [email or web address]

## 2015-09-23 ENCOUNTER — Inpatient Hospital Stay (HOSPITAL_COMMUNITY): Payer: Medicare Other | Admitting: Speech Pathology

## 2015-09-23 ENCOUNTER — Inpatient Hospital Stay (HOSPITAL_COMMUNITY): Payer: Medicare Other | Admitting: Occupational Therapy

## 2015-09-23 ENCOUNTER — Inpatient Hospital Stay (HOSPITAL_COMMUNITY): Payer: Medicare Other | Admitting: Physical Therapy

## 2015-09-23 LAB — BASIC METABOLIC PANEL
Anion gap: 4 — ABNORMAL LOW (ref 5–15)
BUN: 10 mg/dL (ref 6–20)
CHLORIDE: 107 mmol/L (ref 101–111)
CO2: 28 mmol/L (ref 22–32)
Calcium: 8.5 mg/dL — ABNORMAL LOW (ref 8.9–10.3)
Creatinine, Ser: 0.9 mg/dL (ref 0.61–1.24)
GFR calc Af Amer: >60 mL/min (ref >60)
GFR calc non Af Amer: >60 mL/min (ref >60)
GLUCOSE: 94 mg/dL (ref 65–99)
POTASSIUM: 4 mmol/L (ref 3.5–5.1)
Sodium: 139 mmol/L (ref 135–145)

## 2015-09-23 LAB — PROTIME-INR
INR: 2.45 — ABNORMAL HIGH (ref 0.00–1.49)
Prothrombin Time: 26.3 seconds — ABNORMAL HIGH (ref 11.6–15.2)

## 2015-09-23 LAB — LACTATE DEHYDROGENASE: LDH: 190 U/L (ref 98–192)

## 2015-09-23 MED ORDER — BOOST / RESOURCE BREEZE PO LIQD: 1.0000 | Freq: Every day | ORAL | Status: DC

## 2015-09-23 MED ORDER — ENSURE ENLIVE PO LIQD
237.0000 mL | Freq: Two times a day (BID) | ORAL | Status: DC
  Administered 2015-09-23 – 2015-10-01 (×12): 237 mL via ORAL

## 2015-09-23 MED ORDER — MIRTAZAPINE 15 MG PO TABS
30.0000 mg | ORAL_TABLET | Freq: Every day | ORAL | Status: DC
  Administered 2015-09-23 – 2015-10-01 (×9): 30 mg via ORAL
  Filled 2015-09-23 (×9): qty 2

## 2015-09-23 MED ORDER — WARFARIN SODIUM 2.5 MG PO TABS
2.5000 mg | ORAL_TABLET | Freq: Once | ORAL | Status: AC
  Administered 2015-09-23: 2.5 mg via ORAL
  Filled 2015-09-23: qty 1

## 2015-09-23 MED ORDER — BENEPROTEIN PO POWD
1.0000 | Freq: Three times a day (TID) | ORAL | Status: DC
  Administered 2015-09-23 – 2015-10-01 (×19): 6 g via ORAL
  Filled 2015-09-23 (×2): qty 227

## 2015-09-23 NOTE — Progress Notes (Signed)
Occupational Therapy Session Note  Patient Details  Name: [name] MRN: [id number] Date of Birth: [birth year]  Today's Date: 09/23/2015 OT Individual Time: 7681-1572 and 1430-1500 OT Individual Time Calculation (min): 60 min and 30 min   Short Term Goals: Week 1:  OT Short Term Goal 1 (Week 1): Pt will complete UB dressing with supervision while adhering to sternal precautions with min cues OT Short Term Goal 2 (Week 1): Pt will complete toilet transfers with min assist while adhering to sternal precautions with min cues OT Short Term Goal 3 (Week 1): Pt will complete LB dressing with min assist at sit > stand level while adhering to sternal precautions OT Short Term Goal 4 (Week 1): Pt will complete bathing with min assist at sit > stand levle while adhering to sternal precautions OT Short Term Goal 5 (Week 1): Pt will tolerate standing for 4 mins while completing grooming tasks with supervision  Skilled Therapeutic Interventions/Progress Updates:    1) Treatment session with focus on activity tolerance, sit > stand, sequencing, orientation, and recall and adherence to sternal precautions during self-care tasks.  Pt received in bed reporting fatigue and not sleeping well overnight.  After some encouragement, pt willing to come to sitting at EOB for RN to assess MAP.  Once upright pt willing to participate in self-care tasks.  Max cues for sequencing transfer with RW.  Bathing and dressing completed at sit > stand level with mod assist without use of BUE due to sternal precautions.  Pt's daughter present and reports mostly elevated surfaces at home with higher chairs, [facility] available for use, and even a lift chair if needed.  Pt demonstrated improved orientation and sequencing with UB dressing, still requiring min-mod cues for LB dressing.   2) Treatment session with focus on functional mobility, direction following, and adherence to sternal precautions.  Pt received upright and alert in  recliner with wife present.  Asked wife to measure toilet, bed, preferred seat, and car seat height to better simulate as pt continues to have difficulty with sit > stand from lower surfaces.  Pt more engaged in conversation this session with ability to understand and convey to wife possibility of needing BSC over toilet to increase height if needed.  Ambulated to therapy gym without AD with min guard and cues for turning.  Engaged in sit > stand from 22" surface with min assist to min guard.  Engaged in "box step" with cues for directions with stepping forward, backward, and sidestepping.  Returned to room as above and left seated in reclined with all needs in reach.  Therapy Documentation Precautions:  Precautions Precautions: Sternal Precaution Comments: LVAD, Call VAD Pager [phone]. Flow less than 2.5, or sustained increase or decrease of 2.0 L Restrictions Weight Bearing Restrictions: Yes RUE Weight Bearing: Non weight bearing LUE Weight Bearing: Non weight bearing Pain:  Pt with no c/o pain  See Function Navigator for Current Functional Status.   Therapy/Group: Individual Therapy  Rosalio Loud 09/23/2015, 10:28 AM

## 2015-09-23 NOTE — Progress Notes (Signed)
Speech Language Pathology Daily Session Note  Patient Details  Name: [name] MRN: [id number] Date of Birth: 08-09-1940  Today's Date: 09/23/2015 SLP Individual Time: 1030-1100 SLP Individual Time Calculation (min): 30 min  Short Term Goals: Week 1: SLP Short Term Goal 1 (Week 1): Pt will sustain his attention to a basic, familiar tasks for 3 minutes with min verbal cues for redirection.  SLP Short Term Goal 2 (Week 1): Pt will complete a basic, familiar task with min assist verbal cues for functional problem solving.   SLP Short Term Goal 3 (Week 1): Pt will utilize external aids to orient to date and situation with min assist verbal cues.    Skilled Therapeutic Interventions: Skilled treatment session focused on addressing cognition goals. SLP facilitated session by providing Mod assist verbal and visual cues to orient with use of daily schedule.  SLP also facilitated session with Mod verbal cues to attend to task of switching practice LVAD battery connectors.  Spouse and daughter were present during session and more appropriate with their interactions today.  Continue with current plan of care.    Function:   Cognition Comprehension Comprehension assist level: Understands basic 75 - 89% of the time/ requires cueing 10 - 24% of the time  Expression   Expression assist level: Expresses basic 75 - 89% of the time/requires cueing 10 - 24% of the time. Needs helper to occlude trach/needs to repeat words.  Social Interaction Social Interaction assist level: Interacts appropriately 75 - 89% of the time - Needs redirection for appropriate language or to initiate interaction.  Problem Solving Problem solving assist level: Solves basic 25 - 49% of the time - needs direction more than half the time to initiate, plan or complete simple activities  Memory Memory assist level: Recognizes or recalls 25 - 49% of the time/requires cueing 50 - 75% of the time    Pain Pain Assessment Pain  Assessment: No/denies pain  Therapy/Group: Individual Therapy  Charlane Ferretti., CCC-SLP [phone]  [place]mare 09/23/2015, 1:48 PM

## 2015-09-23 NOTE — Progress Notes (Signed)
Subjective/Complaints:   Objective: Vital Signs: Blood pressure 74/55, pulse 68, temperature 98.2 F (36.8 C), temperature source Oral, resp. rate 18, weight 73.4 kg (161 lb 13.1 oz), SpO2 98 %. No results found. Results for orders placed or performed during the hospital encounter of 09/19/15 (from the past 72 hour(s))  Protime-INR     Status: Abnormal   Collection Time: 09/21/15  5:05 AM  Result Value Ref Range   Prothrombin Time 30.6 (H) 11.6 - 15.2 seconds   INR 3.00 (H) 0.00 - 1.49  Lactate dehydrogenase     Status: Abnormal   Collection Time: 09/21/15  5:05 AM  Result Value Ref Range   LDH 266 (H) 98 - 192 U/L  Basic metabolic panel     Status: None   Collection Time: 09/21/15  5:05 AM  Result Value Ref Range   Sodium 141 135 - 145 mmol/L   Potassium 3.9 3.5 - 5.1 mmol/L   Chloride 104 101 - 111 mmol/L   CO2 30 22 - 32 mmol/L   Glucose, Bld 90 65 - 99 mg/dL   BUN 10 6 - 20 mg/dL   Creatinine, Ser 0.88 0.61 - 1.24 mg/dL   Calcium 8.9 8.9 - 10.3 mg/dL   GFR calc non Af Amer >60 >60 mL/min   GFR calc Af Amer >60 >60 mL/min    Comment: (NOTE) The eGFR has been calculated using the CKD EPI equation. This calculation has not been validated in all clinical situations. eGFR's persistently <60 mL/min signify possible Chronic Kidney Disease.    Anion gap 7 5 - 15  Protime-INR     Status: Abnormal   Collection Time: 09/22/15  4:17 AM  Result Value Ref Range   Prothrombin Time 30.2 (H) 11.6 - 15.2 seconds   INR 2.95 (H) 0.00 - 1.49  Lactate dehydrogenase     Status: None   Collection Time: 09/22/15  4:17 AM  Result Value Ref Range   LDH 188 98 - 192 U/L  Basic metabolic panel     Status: Abnormal   Collection Time: 09/22/15  4:17 AM  Result Value Ref Range   Sodium 139 135 - 145 mmol/L   Potassium 3.8 3.5 - 5.1 mmol/L   Chloride 108 101 - 111 mmol/L   CO2 28 22 - 32 mmol/L   Glucose, Bld 94 65 - 99 mg/dL   BUN 10 6 - 20 mg/dL   Creatinine, Ser 0.91 0.61 - 1.24 mg/dL    Calcium 8.7 (L) 8.9 - 10.3 mg/dL   GFR calc non Af Amer >60 >60 mL/min   GFR calc Af Amer >60 >60 mL/min    Comment: (NOTE) The eGFR has been calculated using the CKD EPI equation. This calculation has not been validated in all clinical situations. eGFR's persistently <60 mL/min signify possible Chronic Kidney Disease.    Anion gap 3 (L) 5 - 15  CBC     Status: Abnormal   Collection Time: 09/22/15  4:17 AM  Result Value Ref Range   WBC 3.9 (L) 4.0 - 10.5 K/uL   RBC 3.17 (L) 4.22 - 5.81 MIL/uL   Hemoglobin 8.3 (L) 13.0 - 17.0 g/dL   HCT 27.1 (L) 39.0 - 52.0 %   MCV 85.5 78.0 - 100.0 fL   MCH 26.2 26.0 - 34.0 pg   MCHC 30.6 30.0 - 36.0 g/dL   RDW 17.1 (H) 11.5 - 15.5 %   Platelets 158 150 - 400 K/uL  Protime-INR  Status: Abnormal   Collection Time: 09/23/15  5:12 AM  Result Value Ref Range   Prothrombin Time 26.3 (H) 11.6 - 15.2 seconds   INR 2.45 (H) 0.00 - 1.49  Lactate dehydrogenase     Status: None   Collection Time: 09/23/15  5:12 AM  Result Value Ref Range   LDH 190 98 - 192 U/L  Basic metabolic panel     Status: Abnormal   Collection Time: 09/23/15  5:12 AM  Result Value Ref Range   Sodium 139 135 - 145 mmol/L   Potassium 4.0 3.5 - 5.1 mmol/L   Chloride 107 101 - 111 mmol/L   CO2 28 22 - 32 mmol/L   Glucose, Bld 94 65 - 99 mg/dL   BUN 10 6 - 20 mg/dL   Creatinine, Ser 1.90 0.61 - 1.24 mg/dL   Calcium 8.5 (L) 8.9 - 10.3 mg/dL   GFR calc non Af Amer >60 >60 mL/min   GFR calc Af Amer >60 >60 mL/min    Comment: (NOTE) The eGFR has been calculated using the CKD EPI equation. This calculation has not been validated in all clinical situations. eGFR's persistently <60 mL/min signify possible Chronic Kidney Disease.    Anion gap 4 (L) 5 - 15     HEENT: normal Cardio: LVAD hum Resp: CTA B/L and unlabored GI: NT, ND Extremity:  Pulses positive and No Edema Skin:   Breakdown sacral gr 2 Neuro: Confused and Abnormal Motor 4/5 in BUE and BLE Musc/Skel:  Other  no pain with UE or LE ROM Gen NAD   Assessment/Plan: 1. Functional deficits secondary to deconditioning and encephalopathy which require 3+ hours per day of interdisciplinary therapy in a comprehensive inpatient rehab setting. Physiatrist is providing close team supervision and 24 hour management of active medical problems listed below. Physiatrist and rehab team continue to assess barriers to discharge/monitor patient progress toward functional and medical goals. FIM: Function - Bathing Position: Wheelchair/chair at sink Body parts bathed by patient: Right arm, Left arm, Chest, Front perineal area, Right upper leg, Left upper leg, Abdomen, Buttocks Body parts bathed by helper: Back Bathing not applicable: Left lower leg, Right lower leg Assist Level: Touching or steadying assistance(Pt > 75%)  Function- Upper Body Dressing/Undressing What is the patient wearing?: Pull over shirt/dress Pull over shirt/dress - Perfomed by patient: Thread/unthread right sleeve, Thread/unthread left sleeve Pull over shirt/dress - Perfomed by helper: Pull shirt over trunk, Put head through opening Assist Level:  (Mod assist) Function - Lower Body Dressing/Undressing What is the patient wearing?: Underwear, Non-skid slipper socks, Ted Hose Position: Wheelchair/chair at [employer] - Performed by patient: Pull underwear up/down, Thread/unthread left underwear leg Underwear - Performed by helper: Thread/unthread right underwear leg Non-skid slipper socks- Performed by patient: Don/doff right sock, Don/doff left sock Non-skid slipper socks- Performed by helper: Don/doff right sock, Don/doff left sock TED Hose - Performed by helper: Don/doff right TED hose, Don/doff left TED hose Assist for footwear: Partial/moderate assist Assist for lower body dressing: Touching or steadying assistance (Pt > 75%) (Mod assist sit > stand)  Function - Toileting Toileting steps completed by patient: Adjust clothing after  toileting Toileting steps completed by helper: Adjust clothing prior to toileting, Performs perineal hygiene Assist level: More than reasonable time, Two helpers, Touching or steadying assistance (Pt.75%)  Function - Toilet Transfers Toilet transfer assistive device: Bedside commode Assist level to bedside commode (at bedside): Moderate assist (Pt 50 - 74%/lift or lower) Assist level from bedside commode (at  bedside): Moderate assist (Pt 50 - 74%/lift or lower)  Function - Chair/bed transfer Chair/bed transfer method: Stand pivot Chair/bed transfer assist level: Maximal assist (Pt 25 - 49%/lift and lower) Chair/bed transfer assistive device: Walker Chair/bed transfer details: Tactile cues for sequencing, Tactile cues for weight shifting, Tactile cues for posture, Verbal cues for sequencing, Verbal cues for technique, Verbal cues for precautions/safety, Manual facilitation for weight shifting  Function - Locomotion: Wheelchair Type: Manual (bil LE propulsion) Wheelchair activity did not occur: Safety/medical concerns (sternal precautions) Max wheelchair distance: 150 Assist Level: Supervision or verbal cues Wheel 50 feet with 2 turns activity did not occur: Safety/medical concerns Assist Level: Supervision or verbal cues Wheel 150 feet activity did not occur: Safety/medical concerns Assist Level: Supervision or verbal cues Turns around,maneuvers to table,bed, and toilet,negotiates 3% grade,maneuvers on rugs and over doorsills: No Function - Locomotion: Ambulation Assistive device: Walker-rolling Max distance: 130 Assist level: Touching or steadying assistance (Pt > 75%) Assist level: Touching or steadying assistance (Pt > 75%) Walk 50 feet with 2 turns activity did not occur: Safety/medical concerns Assist level: Moderate assist (Pt 50 - 74%) Walk 150 feet activity did not occur: Safety/medical concerns Walk 10 feet on uneven surfaces activity did not occur: Safety/medical  concerns  Function - Comprehension Comprehension: Auditory Comprehension assist level: Understands basic 75 - 89% of the time/ requires cueing 10 - 24% of the time  Function - Expression Expression: Verbal Expression assist level: Expresses basic 75 - 89% of the time/requires cueing 10 - 24% of the time. Needs helper to occlude trach/needs to repeat words.  Function - Social Interaction Social Interaction assist level: Interacts appropriately 50 - 74% of the time - May be physically or verbally inappropriate.  Function - Problem Solving Problem solving assist level: Solves basic 25 - 49% of the time - needs direction more than half the time to initiate, plan or complete simple activities  Function - Memory Memory assist level: Recognizes or recalls 25 - 49% of the time/requires cueing 50 - 75% of the time Patient normally able to recall (first 3 days only): Current season, That he or she is in a hospital  1. Debilitation secondary to acute on chronic systolic congestive heart failure/recent LVAD 14/97/0263 complicated by left pleural effusion with chest tube Continue CIR PT, OT, SLP 2. DVT Prophylaxis/Anticoagulation: Chronic Coumadin therapy. Monitor for any bleeding episodes 3. Pain Management: Ultram as needed 4. Mood: Remeron increase to 30 mg daily at bedtime, Zyprexa 5 mg daily at bedtime, d/c celexa 5. Neuropsych: This patient is capable of making decisions on his own behalf. 6. Skin/Wound Care: Routine skin checks 7. Fluids/Electrolytes/Nutrition: Routine I&O's with follow-up chemistries 8. Acute blood loss anemia. Follow-up CBC, Hemoglobins running in the 8 range 9. Hypertension/atrial fibrillation. Amiodarone 200 mg twice a day, Aldactone 25 mg daily. Monitor with increased mobility 10. BPH. Flomax 0.4 mg daily. Check PVR 3 11. Decreased nutritional storage. Follow-up dietary consult, enc protein intake, trial beneprotein 12. Memory deficits, per wife this was not a  pre-existing problem. May have some element of encephalopathy  LOS (Days) 4 A FACE TO FACE EVALUATION WAS PERFORMED  Tamy Accardo E 09/23/2015, 8:25 AM

## 2015-09-23 NOTE — Progress Notes (Signed)
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 Implant hospitalization was from 08/22/15 - 09/19/15.   Vital signs: Temo: 98.0 HR: 68 Doppler MAP: 84  O2 Sat: 98% on RA Wt in lbs:  179 > 170 > 168 > 137 > 161   LVAD interrogation reveals:  Speed: 9400 Flow: 4.2 Power: 5.3w PI: 5.5 Alarms: none Events:  10 PI events Fixed speed:  9400 Low speed limit: 8800  Drive Line:  C/D/I. Daily dressing changes using guaze dressing with Aquacel silver strip on exit site being maintained per wife. Dressing supplies at bedside.     Labs:  LDH trend:  196 > 266 > 188 > 190  INR trend: 2.12 > 2.47 > 3.0 > 2.95 > 2.45  Hgb:  8.5 > 9.0 > 8.3  Antithrombotic Management:   INR goal: 2.0 - 2.5 ASA 81 mg    Plan/Recommendations:  1. Patient did not sleep well last night; wife at bedside. Pt asleep now, nurse, OT/PT adjusting daily schedule to accommodate pt's rest. 2. Nurse will obtain loveseat/recliner to improve wife's comfort.  3. Reinforce VAD training prior to discharge.   Hessie Diener, RN VAD Coordinator  Office: [phone] 24/7 Emergency VAD Pager: [phone]

## 2015-09-23 NOTE — Progress Notes (Signed)
Physical Therapy Session Note  Patient Details  Name: [name] MRN: [id number] Date of Birth: 1940/06/15  Today's Date: 09/23/2015 PT Individual Time: 1100-1200 PT Individual Time Calculation (min): 60 min   Short Term Goals: Week 1:  PT Short Term Goal 1 (Week 1): Pt will perform bed mobility on flat bed with min A while maintaining sternal precautions PT Short Term Goal 2 (Week 1): Pt will perform sit <> stand and stand pivot transfers with min A from various heights of surfaces while maintaining sternal precautions PT Short Term Goal 3 (Week 1): Pt will perform gait x 100' with RW and min A  PT Short Term Goal 4 (Week 1): Pt will negotiate 4 stairs (6 inches) with 2 rails with min A   Skilled Therapeutic Interventions/Progress Updates:    Pt received seated in recliner with handoff from SLP; denies pain and agreeable to treatment. Wife transferred pt from wall to battery power with assist from therapist for time management. Gait within room x10-15' per trial with variable HHA vs no AD, and min guard/minA overall. Occasional mild staggering, R lateral lean, and cues for attention, recall of task, problem solving. Pt brushed teeth, used restroom and washed hands all with min guard for dynamic standing balance and gait. Requires seated rest break after completion of these tasks d/t fatigue. Gait to/from gym with min guard overall, one major LOB while initially exiting room as pt impulsively changes direction, reporting "I thought I saw someone I knew", requiring maxA to maintain balance and prevent fall. Pt and wife flustered by near-fall, and discussed goals of therapy to improve balance, and work towards integrating cognitive dual task into therapy to improve safety with functional gait. Sit <>stand 3x5 reps from elevated mat table lowered with each set; performed for LE strengthening, body mechanics, and reinforcing BUE weight bearing precautions during functional mobility. Stairs 2x12  with B handrails and min guard. Returned to room as described above; remained seated in recliner at completion of session, all needs in reach and wife present.   Therapy Documentation Precautions:  Precautions Precautions: Sternal Precaution Comments: LVAD, Call VAD Pager [phone]. Flow less than 2.5, or sustained increase or decrease of 2.0 L Restrictions Weight Bearing Restrictions: Yes RUE Weight Bearing: Non weight bearing LUE Weight Bearing: Non weight bearing Pain: Pain Assessment Pain Assessment: No/denies pain   See Function Navigator for Current Functional Status.   Therapy/Group: Individual Therapy  [name] 09/23/2015, 12:23 PM

## 2015-09-23 NOTE — Progress Notes (Signed)
Speech Language Pathology Daily Session Note  Patient Details  Name: [name] MRN: [id number] Date of Birth: 1940-06-03  Today's Date: 09/23/2015 SLP Individual Time: 1304-1330 SLP Individual Time Calculation (min): 26 min  Short Term Goals: Week 1: SLP Short Term Goal 1 (Week 1): Pt will sustain his attention to a basic, familiar tasks for 3 minutes with min verbal cues for redirection.  SLP Short Term Goal 2 (Week 1): Pt will complete a basic, familiar task with min assist verbal cues for functional problem solving.   SLP Short Term Goal 3 (Week 1): Pt will utilize external aids to orient to date and situation with min assist verbal cues.    Skilled Therapeutic Interventions:  Pt was seen for skilled ST targeting cognitive goals.  Pt was oriented to situation with supervision question cues but required max assist verbal and visual cues to identify components of LVAD.  Pt required max faded to mod assist verbal cues to return demonstration of how to disconnect and reconnect wires for battery pack.  Performance improved with repetition of trials although inconsistencies were still noted.  Pt was left in recliner at the end of today's therapy session with wife at bedside.  Continue per current plan of care.    Function:  Eating Eating                 Cognition Comprehension Comprehension assist level: Understands basic 75 - 89% of the time/ requires cueing 10 - 24% of the time  Expression   Expression assist level: Expresses basic 75 - 89% of the time/requires cueing 10 - 24% of the time. Needs helper to occlude trach/needs to repeat words.  Social Interaction Social Interaction assist level: Interacts appropriately 75 - 89% of the time - Needs redirection for appropriate language or to initiate interaction.  Problem Solving Problem solving assist level: Solves basic 25 - 49% of the time - needs direction more than half the time to initiate, plan or complete simple activities   Memory Memory assist level: Recognizes or recalls 25 - 49% of the time/requires cueing 50 - 75% of the time    Pain Pain Assessment Pain Assessment: No/denies pain  Therapy/Group: Individual Therapy  Joliana Claflin, Melanee Spry 09/23/2015, 3:55 PM

## 2015-09-23 NOTE — Progress Notes (Signed)
Patient ID: [name], male   DOB: Jun 23, 1940, 75 y.o.   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 Normal cors. EF 10% with low cardiac output.   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   Foley placed 7/3 for urinary retention. S/P CT with 1700 cc fluid out.   Ramp echo 7/10 with increase in speed to 9400 rpm.   Continue to improve with PT.  Ate most of his breakfast this morning, though to his wifes chagrin without protein. Still having multiple PI events in last 24 hrs.  More alert and clear this am. Having more R ankle swelling  LVAD INTERROGATION:  HeartMate II LVAD:  Flow 4.9 liters/min, speed 9400, power 5.6, PI 4.1 10 PI events/24 hrs.   Objective:    Vital Signs:   Temp:  [98 F (36.7 C)-98.2 F (36.8 C)] 98.2 F (36.8 C) (07/17 0500) Pulse Rate:  [68] 68 (07/17 0500) Resp:  [18-19] 18 (07/17 0500) SpO2:  [98 %-100 %] 98 % (07/17 0500) Weight:  [161 lb 13.1 oz (73.4 kg)] 161 lb 13.1 oz (73.4 kg) (07/17 0500) Last BM Date: 09/21/15 Mean arterial Pressure 70s-80s  Intake/Output:   Intake/Output Summary (Last 24 hours) at 09/23/15 1029 Last data filed at 09/23/15 0900  Gross per 24 hour  Intake   1120 ml  Output   2050 ml  Net   -930 ml     Physical Exam: General: In the chair. NAD.  HEENT: normal  Neck: supple. JVP ~7 cm Cor: LVAD hum.  Lungs: Decrease basilar sounds Abdomen: soft, non-tender, non-distended. No HSM. Bowel sound present. Driveline site ok. Securement device in place. Extremities: no cyanosis, clubbing, rash, R and LLE trace- 1+ edema R>L. RUE PICC Neuro: Alert and oriented to person and place GU: Condom Cath   Labs: Basic Metabolic Panel:  Recent Labs Lab 09/19/15 0453 09/20/15 0810 09/21/15 0505 09/22/15 0417 09/23/15 0512  NA 139 138 141 139 139  K 4.1 3.8 3.9 3.8 4.0  CL 107 103 104 108 107  CO2 GLUCOSE 85  95 90 94 94  BUN CREATININE 0.79 0.86 0.88 0.91 0.90  CALCIUM 8.4* 8.6* 8.9 8.7* 8.5*    Liver Function Tests:  Recent Labs Lab 09/20/15 0810  AST 19  ALT 22  ALKPHOS 67  BILITOT 0.8  PROT 5.5*  ALBUMIN 2.5*   No results for input(s): LIPASE, AMYLASE in the last 168 hours. No results for input(s): AMMONIA in the last 168 hours.  CBC:  Recent Labs Lab 09/17/15 0425 09/18/15 1204 09/19/15 0453 09/20/15 0810 09/22/15 0417  WBC 4.1 4.4 4.1 5.0 3.9*  NEUTROABS  --   --   --  3.4  --   HGB 8.3* 8.4* 8.5* 9.0* 8.3*  HCT 28.3* 28.3* 28.5* 29.6* 27.1*  MCV 87.1 86.5 86.1 86.0 85.5  PLT 190 178 176 182 158    INR:  Recent Labs Lab 09/19/15 0453 09/20/15 0810 09/21/15 0505 09/22/15 0417 09/23/15 0512  INR 2.12* 2.47* 3.00* 2.95* 2.45*    Other results:  EKG:   Imaging: No results found.   Medications:     Scheduled Medications: . amiodarone  200 mg Oral BID  . aspirin EC  81 mg Oral Daily  . bisacodyl  10 mg Oral Daily   Or  . bisacodyl  10 mg Rectal Daily  . feeding supplement  1 Container Oral TID BM  . ferrous fumarate-b12-vitamic C-folic acid  1 capsule Oral TID PC  . mirtazapine  30 mg Oral QHS  . OLANZapine  5 mg Oral QHS  . pantoprazole  40 mg Oral Daily  . protein supplement  1 scoop Oral TID WC  . sodium chloride flush  10-40 mL Intracatheter Q12H  . tamsulosin  0.4 mg Oral Daily  . Warfarin - Pharmacist Dosing Inpatient   Does not apply q1800    Infusions:    PRN Medications: acetaminophen, docusate sodium, ondansetron **OR** ondansetron (ZOFRAN) IV, polyvinyl alcohol, sodium chloride flush, sorbitol, traMADol   Assessment:   1. 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 EF 15%. Cath 8/14 with normal cors. 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  2. Frequent PVCs 3. Severe malnutrition- Prealbumin 14.7 on 6/20 4. Acute post-op delirium/sundowning 5. Urinary Retention requiring Foley 6. Left  pleural effusion s/p chest tube  Plan/Discussion:    Occasional confusion but generally clear mentally.   Limited oral intake. Cleda Daub held as of 09/23/15. Lasix on hold as well. Have asked him to increase po intake. Ate most of his breakfast this morning. Is getting some increase ankle edema.  Will need to add back diuretics as oral intake improves.   Pharmacy dosing coumadin, goal INR 2-2.5.   Isolated power spike noted (10.4) several days ago. Event log was reviewed thought to be pump burn in.  LDH stable at 190 today.   Appreciate CIR and input. Continue therapy.   I reviewed the LVAD parameters from today, and compared the results to the patient's prior recorded data.  No programming changes were made.  The LVAD is functioning within specified parameters.  The patient performs LVAD self-test daily.  LVAD interrogation was negative for any significant power changes, alarms or PI events/speed drops.  LVAD equipment check completed and is in good working order.  Back-up equipment present.   LVAD education done on emergency procedures and precautions and reviewed exit site care.  Length of Stay: 4  Graciella Freer PA-C 09/23/2015, 10:29 AM  VAD Team --- VAD ISSUES ONLY--- Pager [phone] (7am - 7am)  Advanced Heart Failure Team  Pager [phone] (M-F; 7a - 4p)  Please contact [facility] for night-coverage after hours (4p -7a ) and weekends on [email or web address]  Patient seen with PA, agree with the above note.  Seems to be doing better today.  Working with PT.  PI a bit higher, weight lower.  Labs stable.  No changes.   Marca Ancona 09/23/2015

## 2015-09-23 NOTE — Progress Notes (Signed)
ANTICOAGULATION CONSULT NOTE - Follow Up Consult  Pharmacy Consult for Warfarin Indication: LVAD  Allergies  Allergen Reactions  . Phenergan [Promethazine Hcl] Nausea And Vomiting  . Lasix [Furosemide] Other (See Comments)    "Dropped blood pressure too low"  . Morphine And Related Other (See Comments)    Goes crazy     Patient Measurements: Weight: 161 lb 13.1 oz (73.4 kg)  Vital Signs: Temp: 98.2 F (36.8 C) (07/17 0500) Temp Source: Oral (07/17 0500) Pulse Rate: 68 (07/17 0500)  Labs:  Recent Labs  09/21/15 0505 09/22/15 0417 09/23/15 0512  HGB  --  8.3*  --   HCT  --  27.1*  --   PLT  --  158  --   LABPROT 30.6* 30.2* 26.3*  INR 3.00* 2.95* 2.45*  CREATININE 0.88 0.91 0.90    Estimated Creatinine Clearance: 75.9 mL/min (by C-G formula based on Cr of 0.9).  Assessment: 74yom s/p LVAD 6/23 continues on coumadin (started low dose per MD on 6/24, pharmacy dosing starting 7/3). INR is therapeutic at 2.45 down from 2.95 (after holding one dose and giving only 1mg  last night). Anticipate that it will continue to trend down so will increase dose tonight. Of note, patient is on amiodarone. No reports of bleeding. Hgb 8.3 and PLT wnl.  Goal of Therapy:  INR 2-2.5   Plan:  - Warfarin 2.5mg  PO tonight.  - Monitor daily INR while inpatient.  - Monitor for x/sx of bleeding.    Sherron Monday, PharmD Clinical Pharmacy Resident Pager: [phone] 09/23/2015 2:07 PM

## 2015-09-23 NOTE — Progress Notes (Signed)
Initial Nutrition Assessment  DOCUMENTATION CODES:   Not applicable  INTERVENTION:  Continue Beneprotein (1 scoop) TID with meals, each scoop provides 25 kcal, and 6 grams of protein.   Provide Ensure Enlive po BID, each supplement provides 350 kcal and 20 grams of protein.  Provide Boost Breeze po once daily, each supplement provides 250 kcal and 9 grams of protein.  Encourage adequate PO intake.   NUTRITION DIAGNOSIS:   Malnutrition related to chronic illness as evidenced by percent weight loss, moderate depletions of muscle mass.  GOAL:   Patient will meet greater than or equal to 90% of their needs  MONITOR:   PO intake, Supplement acceptance, Weight trends, Labs, I & O's, Skin  REASON FOR ASSESSMENT:   Consult Poor PO  ASSESSMENT:   75 y.o. right handed male with history of cardiomyopathy and left bundle branch block. Presented 08/21/2015 through the heart failure clinic with dizziness and low volume output. 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 Found to be in cardiogenic shock. Workup per cardiology services and cardiothoracic surgery patient underwent LVAD 08/30/2015   Meal completion has been varied from 25-100%. Pt reports appetite is fine. Wife at bedside reports pt dislikes some to most food served. Family has been bringing in food from outside/home. Noted pt with a protein supplement canister at bedside (Isagenix) protein powder, each serving provides 240 kcal and 24 grams of protein, however canister is close to empty. Wife requests Ensure Shakes to be ordered. Pt currently has Boost Breeze ordered and pt has been refusing most of them. Pt reports they are "too sweet" and says he will drink them if mixed with some water. RD to modify orders. MD additionally has ordered Beneprotein with meals. Usual body weight reported to be ~171 lbs one month PTA. Pt with a 5.8% weight loss in 1 month. RD to continue to monitor.    Nutrition-Focused physical exam completed. Findings are no fat depletion, moderate muscle depletion, and mild edema.   Labs and medications reviewed.   Diet Order:  Diet regular Room service appropriate?: Yes; Fluid consistency:: Thin  Skin:  Wound (see comment) (Stage II on coccyx)  Last BM:  7/15  Height:   Ht Readings from Last 1 Encounters:  08/21/15 6\' 2"  (1.88 m)    Weight:   Wt Readings from Last 1 Encounters:  09/23/15 161 lb 13.1 oz (73.4 kg)    Ideal Body Weight:  86.36 kg  BMI:  Body mass index is 20.77 kg/(m^2).  Estimated Nutritional Needs:   Kcal:  2000-2200  Protein:  95-105 grams  Fluid:  Per MD  EDUCATION NEEDS:   Education needs addressed  Roslyn Smiling, MS, RD, LDN Pager # [phone] After hours/ weekend pager # [phone]

## 2015-09-24 ENCOUNTER — Inpatient Hospital Stay (HOSPITAL_COMMUNITY): Payer: Medicare Other | Admitting: Physical Therapy

## 2015-09-24 ENCOUNTER — Inpatient Hospital Stay (HOSPITAL_COMMUNITY): Payer: Medicare Other | Admitting: Occupational Therapy

## 2015-09-24 ENCOUNTER — Inpatient Hospital Stay (HOSPITAL_COMMUNITY): Payer: Medicare Other | Admitting: Speech Pathology

## 2015-09-24 DIAGNOSIS — Z95811 Presence of heart assist device: Secondary | ICD-10-CM | POA: Diagnosis present

## 2015-09-24 LAB — PROTIME-INR
INR: 1.92 — ABNORMAL HIGH (ref 0.00–1.49)
PROTHROMBIN TIME: 21.9 s — AB (ref 11.6–15.2)

## 2015-09-24 LAB — BASIC METABOLIC PANEL
Anion gap: 5 (ref 5–15)
BUN: 10 mg/dL (ref 6–20)
CALCIUM: 8.7 mg/dL — AB (ref 8.9–10.3)
CO2: 28 mmol/L (ref 22–32)
CREATININE: 0.82 mg/dL (ref 0.61–1.24)
Chloride: 108 mmol/L (ref 101–111)
Glucose, Bld: 87 mg/dL (ref 65–99)
Potassium: 3.9 mmol/L (ref 3.5–5.1)
SODIUM: 141 mmol/L (ref 135–145)

## 2015-09-24 LAB — LACTATE DEHYDROGENASE: LDH: 186 U/L (ref 98–192)

## 2015-09-24 MED ORDER — OXYBUTYNIN CHLORIDE 5 MG PO TABS
2.5000 mg | ORAL_TABLET | Freq: Every day | ORAL | Status: DC
  Administered 2015-09-24 – 2015-09-25 (×2): 2.5 mg via ORAL
  Filled 2015-09-24 (×2): qty 1

## 2015-09-24 MED ORDER — WARFARIN SODIUM 4 MG PO TABS
4.0000 mg | ORAL_TABLET | Freq: Once | ORAL | Status: AC
  Administered 2015-09-24: 4 mg via ORAL
  Filled 2015-09-24: qty 1

## 2015-09-24 MED ORDER — DOCUSATE SODIUM 100 MG PO CAPS
100.0000 mg | ORAL_CAPSULE | Freq: Two times a day (BID) | ORAL | Status: DC
  Administered 2015-09-24 – 2015-09-26 (×5): 100 mg via ORAL
  Filled 2015-09-24 (×5): qty 1

## 2015-09-24 NOTE — Progress Notes (Addendum)
Patient tolerating therapy throughout day. Assessment of LVAD completed this am WNL. No complaint of pain reported. Patient complained of left rib pain radiating to left chest area during ambulation with daughter in hallway. Patient reported pain denied shortness of breath. Assisted patient back to room via wheelchair at 1808. Skin warm and dry WNL. Patient assessed,  MAP 89. Patient reports feeling better and denies chest pain while sitting up in bed. Instructed patient and family to call if chest pain reoccurred. LVAD coordinator notified for questions regarding decrease of power to 9200. Patient reported no chest pain until shift change. Night shift RN Dahlia Client reviewed controller check and patient reported chest pain again while lying on side. repositioned patient and relief of pain reported supine position. Patient controller reporting power cable disconnect. Green light remains on. Notified Molly, LVAD coordinator for questions concerning controlling and power module reports. Dr. Shirlee Latch office called at 2020. Continue with plan of care.  Oletha Cruel S  LVAD coordinator assessing patient controller at this time. 2100.

## 2015-09-24 NOTE — Progress Notes (Signed)
Occupational Therapy Session Note  Patient Details  Name: [name] MRN: [id number] Date of Birth: 1941-02-26  Today's Date: 09/24/2015 OT Individual Time: 4920-1007 OT Individual Time Calculation (min): 69 min    Short Term Goals: Week 1:  OT Short Term Goal 1 (Week 1): Pt will complete UB dressing with supervision while adhering to sternal precautions with min cues OT Short Term Goal 2 (Week 1): Pt will complete toilet transfers with min assist while adhering to sternal precautions with min cues OT Short Term Goal 3 (Week 1): Pt will complete LB dressing with min assist at sit > stand level while adhering to sternal precautions OT Short Term Goal 4 (Week 1): Pt will complete bathing with min assist at sit > stand levle while adhering to sternal precautions OT Short Term Goal 5 (Week 1): Pt will tolerate standing for 4 mins while completing grooming tasks with supervision  Skilled Therapeutic Interventions/Progress Updates:    Treatment session with focus on sit > stand, activity tolerance, management of symptoms and sternal precautions during self-care tasks.  Pt in bed upon arrival reporting fatigue but easily encouraged to participate.  LVAD RN present initially in session, noting PI dropping with sit > stand similar to orthostatic.  Encouraged to monitor symptoms during activity and include seated rest breaks as well as increase fluid intake.  Pt with no reports of dizziness or light headedness in standing or throughout session.  Pt with improved sequencing during bathing, washing feet by crossing over opposite knee.  Pt demonstrated difficulty with donning underwear and shorts but improved orientation.  Attempted to thread arms through head hole, requiring cues for orientation.  Pt attempting to move LVAD controller off of chair, reoriented to LVAD purpose and need to maintain controller close due to drive line.  Mod assist sit > stand throughout session, cues each time for arm  placement so as not to push up with UE due to sternal precautions.  Encouraged pt to drink fluids throughout and left pt seated upright in recliner with fresh drink and brother present.  Therapy Documentation Precautions:  Precautions Precautions: Sternal Precaution Comments: LVAD, Call VAD Pager [phone]. Flow less than 2.5, or sustained increase or decrease of 2.0 L Restrictions Weight Bearing Restrictions: No RUE Weight Bearing: Non weight bearing LUE Weight Bearing: Non weight bearing Pain: Pain Assessment Pain Assessment: No/denies pain  See Function Navigator for Current Functional Status.   Therapy/Group: Individual Therapy  Rosalio Loud 09/24/2015, 10:34 AM

## 2015-09-24 NOTE — Progress Notes (Signed)
Physical Therapy Session Note  Patient Details  Name: [name] MRN: [id number] Date of Birth: Mar 31, 1940  Today's Date: 09/24/2015 PT Individual Time: 1400-1500 PT Individual Time Calculation (min): 60 min   Short Term Goals: Week 1:  PT Short Term Goal 1 (Week 1): Pt will perform bed mobility on flat bed with min A while maintaining sternal precautions PT Short Term Goal 2 (Week 1): Pt will perform sit <> stand and stand pivot transfers with min A from various heights of surfaces while maintaining sternal precautions PT Short Term Goal 3 (Week 1): Pt will perform gait x 100' with RW and min A  PT Short Term Goal 4 (Week 1): Pt will negotiate 4 stairs (6 inches) with 2 rails with min A   Skilled Therapeutic Interventions/Progress Updates:    Pt received in restroom with daughter providing min guard for balance and assist for hygiene and clothing management. Ambulated out of bathroom with min guard and stood at sink to wash hands with S. Seated rest break d/t fatigue. Gait with daughter providing min guard x175' with daughter appropriately giving pt cueing for attention to surroundings, identifying tripping hazards. Stairs 1x12 with 1 handrail to simulate home environment. Biodex catch game to improve weight shifting, ankle control and postural awareness. Dynamic gait in parallel bars including side stepping and braiding to improve BLE coordination, sequencing, LE strengthening and motor control. Pt requires mod/max cues for sequencing, attention, recall/memory throughout session. Returned to room with gait min guard; remained seated in recliner with daughter present at completion of session. Daughter educated on safety with ambulation in room and hallway, requested that if leaving the unit that pt use w/c and not ambulate; pt and daughter agreeable.   Therapy Documentation Precautions:  Precautions Precautions: Sternal Precaution Comments: LVAD, Call VAD Pager [phone]. Flow less than  2.5, or sustained increase or decrease of 2.0 L Restrictions Weight Bearing Restrictions: No RUE Weight Bearing: Non weight bearing LUE Weight Bearing: Non weight bearing Pain: Pain Assessment Pain Assessment: No/denies pain   See Function Navigator for Current Functional Status.   Therapy/Group: Individual Therapy  [name] 09/24/2015, 3:24 PM

## 2015-09-24 NOTE — Progress Notes (Addendum)
Patient ID: [name], male   DOB: 1940/08/25, 75 y.o.   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 Normal cors. EF 10% with low cardiac output.   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   Foley placed 7/3 for urinary retention. S/P CT with 1700 cc fluid out.   Ramp echo 7/10 with increase in speed to 9400 rpm.   Continue to improve with PT.  Overall doing ok, still needs work on his balance.   LVAD INTERROGATION:  HeartMate II LVAD:  Flow 5.1 liters/min, speed 9400, power 5.7, PI 6.3    Objective:    Vital Signs:   Temp:  [98.4 F (36.9 C)-98.6 F (37 C)] 98.4 F (36.9 C) (07/18 1543) Pulse Rate:  [60] 60 (07/18 0500) Resp:  [16] 16 (07/18 0500) SpO2:  [96 %] 96 % (07/18 0500) Weight:  [157 lb 13.6 oz (71.6 kg)] 157 lb 13.6 oz (71.6 kg) (07/18 0500) Last BM Date: 09/21/15 Mean arterial Pressure 88  Intake/Output:   Intake/Output Summary (Last 24 hours) at 09/24/15 1552 Last data filed at 09/24/15 0928  Gross per 24 hour  Intake    480 ml  Output   1100 ml  Net   -620 ml     Physical Exam: General: In the chair. NAD.  HEENT: normal  Neck: supple. JVP ~7 cm Cor: LVAD hum.  Lungs: Decrease basilar sounds Abdomen: soft, non-tender, non-distended. No HSM. Bowel sound present. Driveline site ok. Securement device in place. Extremities: no cyanosis, clubbing, rash.  1+ ankle edema.  Neuro: Alert and oriented to person and place  Labs: Basic Metabolic Panel:  Recent Labs Lab 09/20/15 0810 09/21/15 0505 09/22/15 0417 09/23/15 0512 09/24/15 0445  NA 138 141 139 139 141  K 3.8 3.9 3.8 4.0 3.9  CL 103 104 108 107 108  CO2 GLUCOSE 95 90 94 94 87  BUN CREATININE 0.86 0.88 0.91 0.90 0.82  CALCIUM 8.6* 8.9 8.7* 8.5* 8.7*    Liver Function Tests:  Recent Labs Lab 09/20/15 0810  AST 19  ALT 22  ALKPHOS 67  BILITOT 0.8   PROT 5.5*  ALBUMIN 2.5*   No results for input(s): LIPASE, AMYLASE in the last 168 hours. No results for input(s): AMMONIA in the last 168 hours.  CBC:  Recent Labs Lab 09/18/15 1204 09/19/15 0453 09/20/15 0810 09/22/15 0417  WBC 4.4 4.1 5.0 3.9*  NEUTROABS  --   --  3.4  --   HGB 8.4* 8.5* 9.0* 8.3*  HCT 28.3* 28.5* 29.6* 27.1*  MCV 86.5 86.1 86.0 85.5  PLT 178 176 182 158    INR:  Recent Labs Lab 09/20/15 0810 09/21/15 0505 09/22/15 0417 09/23/15 0512 09/24/15 0445  INR 2.47* 3.00* 2.95* 2.45* 1.92*    Other results:  EKG:   Imaging: No results found.   Medications:     Scheduled Medications: . amiodarone  200 mg Oral BID  . aspirin EC  81 mg Oral Daily  . bisacodyl  10 mg Oral Daily   Or  . bisacodyl  10 mg Rectal Daily  . docusate sodium  100 mg Oral BID  . feeding supplement  1 Container Oral Q1500  . feeding supplement (ENSURE ENLIVE)  237 mL Oral BID BM  . ferrous fumarate-b12-vitamic C-folic acid  1 capsule Oral TID  PC  . mirtazapine  30 mg Oral QHS  . OLANZapine  5 mg Oral QHS  . oxybutynin  2.5 mg Oral QHS  . pantoprazole  40 mg Oral Daily  . protein supplement  1 scoop Oral TID WC  . sodium chloride flush  10-40 mL Intracatheter Q12H  . tamsulosin  0.4 mg Oral Daily  . warfarin  4 mg Oral ONCE-1800  . Warfarin - Pharmacist Dosing Inpatient   Does not apply q1800    Infusions:    PRN Medications: acetaminophen, ondansetron **OR** ondansetron (ZOFRAN) IV, polyvinyl alcohol, sodium chloride flush, sorbitol, traMADol   Assessment:   1. 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 EF 15%. Cath 8/14 with normal cors. 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  2. Frequent PVCs 3. Severe malnutrition- Prealbumin 14.7 on 6/20 4. Acute post-op delirium/sundowning 5. Urinary Retention requiring Foley 6. Left pleural effusion s/p chest tube  Plan/Discussion:    Occasional confusion but generally clear mentally.    Limited oral intake. Cleda Daub held as of 09/23/15. Lasix on hold as well. Have asked him to increase po intake. Seems to be doing better.    Pharmacy dosing coumadin, goal INR 2-2.5.    MAP stable.   Isolated power spike noted (10.4) last week. Event log was reviewed thought to be pump burn in.  LDH stable at 186 today.   Appreciate CIR and input. Continue therapy.   I reviewed the LVAD parameters from today, and compared the results to the patient's prior recorded data.  No programming changes were made.  The LVAD is functioning within specified parameters.  The patient performs LVAD self-test daily.  LVAD interrogation was negative for any significant power changes, alarms or PI events/speed drops.  LVAD equipment check completed and is in good working order.  Back-up equipment present.   LVAD education done on emergency procedures and precautions and reviewed exit site care.  Length of Stay: 5  Marca Ancona  09/24/2015, 3:52 PM  VAD Team --- VAD ISSUES ONLY--- Pager [phone] (7am - 7am)  Advanced Heart Failure Team  Pager [phone] (M-F; 7a - 4p)  Please contact [facility] for night-coverage after hours (4p -7a ) and weekends on [email or web address]

## 2015-09-24 NOTE — Progress Notes (Signed)
LATE ENTRY:   Dayshift RN and this RN in to room to do LVAD dressing change, upon entering room family/patient made team aware monitor saying "power cable disconnect" although green light present.  No alarms going at this time. Patient still with complaints of chest pain, relief after position change. LVAD coordinator, Kirt Boys, paged to troubleshoot without success over phone and to room shortly. Dayshift RN called on-call MD for LVAD team Shirlee Latch) to make aware of situation. No new orders received.  Molly and this RN at bedside assessing patient; no further complaints of chest pain, no alarms sounding at this time.  Driveline dressing change performed by [facility] at this time. Will continue to monitor patient.

## 2015-09-24 NOTE — Progress Notes (Signed)
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 Implant hospitalization was from 08/22/15 - 09/19/15.   Vital signs: Temo: 98.4 HR: 88 Doppler MAP: not done O2 Sat: 96% on RA Wt in lbs:  179 > 170 > 168 > 137 > 161 > 157 lbs   LVAD interrogation reveals:  Speed: 9400 Flow: 5.1 Power: 5.0w PI: 4.9 Alarms: none Events:  6 PI events Fixed speed:  9400 Low speed limit: 8800  Drive Line:  C/D/I. Daily dressing changes using gauze dressing with Aquacel silver strip on exit site being maintained per wife. Dressing supplies at bedside.     Labs:  LDH trend:  196 > 266 > 188 > 190 > 186  INR trend: 2.12 > 2.47 > 3.0 > 2.95 > 2.45 > 1.92  Hgb:  8.5 > 9.0 > 8.3   Antithrombotic Management:   INR goal: 2.0 - 2.5 ASA 81 mg    Plan/Recommendations:  1. Patient working with OT while I was in the room. Pts PI dropped consistenly to the 2 range every time he stands. Speed dropped to 9200 per Dr. Cher Nakai notified of change in speed. 2. Reinforce VAD training prior to discharge.   Carlton Adam, RN VAD Coordinator  Office: [phone] 24/7 Emergency VAD Pager: [phone]

## 2015-09-24 NOTE — Progress Notes (Signed)
ANTICOAGULATION CONSULT NOTE - Follow Up Consult  Pharmacy Consult for Coumadin Indication: LVAD  Allergies  Allergen Reactions  . Phenergan [Promethazine Hcl] Nausea And Vomiting  . Lasix [Furosemide] Other (See Comments)    "Dropped blood pressure too low"  . Morphine And Related Other (See Comments)    Goes crazy     Patient Measurements: Weight: 157 lb 13.6 oz (71.6 kg) (one pillow, sheet and blanket)  Vital Signs: Temp: 98.6 F (37 C) (07/18 0500) Temp Source: Oral (07/18 0500) Pulse Rate: 60 (07/18 0500)  Labs:  Recent Labs  09/22/15 0417 09/23/15 0512 09/24/15 0445  HGB 8.3*  --   --   HCT 27.1*  --   --   PLT 158  --   --   LABPROT 30.2* 26.3* 21.9*  INR 2.95* 2.45* 1.92*  CREATININE 0.91 0.90 0.82    Estimated Creatinine Clearance: 81.3 mL/min (by C-G formula based on Cr of 0.82).  Assessment: 74yom s/p LVAD 6/23 continues on coumadin (started low dose per MD on 6/24, pharmacy dosing starting 7/3). INR trended down yesterday and dose was increased. Today's INR continues to trend down to 1.92. He continues on po amiodarone. LDH stable, no CBC today.  No bleeding.  Goal of Therapy:  INR 2-2.5 Monitor platelets by anticoagulation protocol: yes   Plan:  1) Increase coumadin to 4mg  x 1 2) Daily INR  Louie Casa, PharmD, BCPS 09/24/2015 10:39 AM

## 2015-09-24 NOTE — Progress Notes (Signed)
Subjective/Complaints: Was up voiding 3-4 times last noc also had a hard BM, tried not to strain VOiding normal volumes  ROS- no CP, SOB, N/V/D  Objective: Vital Signs: Blood pressure 74/55, pulse 60, temperature 98.6 F (37 C), temperature source Oral, resp. rate 16, weight 71.6 kg (157 lb 13.6 oz), SpO2 96 %. No results found. Results for orders placed or performed during the hospital encounter of 09/19/15 (from the past 72 hour(s))  Protime-INR     Status: Abnormal   Collection Time: 09/22/15  4:17 AM  Result Value Ref Range   Prothrombin Time 30.2 (H) 11.6 - 15.2 seconds   INR 2.95 (H) 0.00 - 1.49  Lactate dehydrogenase     Status: None   Collection Time: 09/22/15  4:17 AM  Result Value Ref Range   LDH 188 98 - 192 U/L  Basic metabolic panel     Status: Abnormal   Collection Time: 09/22/15  4:17 AM  Result Value Ref Range   Sodium 139 135 - 145 mmol/L   Potassium 3.8 3.5 - 5.1 mmol/L   Chloride 108 101 - 111 mmol/L   CO2 28 22 - 32 mmol/L   Glucose, Bld 94 65 - 99 mg/dL   BUN 10 6 - 20 mg/dL   Creatinine, Ser 0.91 0.61 - 1.24 mg/dL   Calcium 8.7 (L) 8.9 - 10.3 mg/dL   GFR calc non Af Amer >60 >60 mL/min   GFR calc Af Amer >60 >60 mL/min    Comment: (NOTE) The eGFR has been calculated using the CKD EPI equation. This calculation has not been validated in all clinical situations. eGFR's persistently <60 mL/min signify possible Chronic Kidney Disease.    Anion gap 3 (L) 5 - 15  CBC     Status: Abnormal   Collection Time: 09/22/15  4:17 AM  Result Value Ref Range   WBC 3.9 (L) 4.0 - 10.5 K/uL   RBC 3.17 (L) 4.22 - 5.81 MIL/uL   Hemoglobin 8.3 (L) 13.0 - 17.0 g/dL   HCT 27.1 (L) 39.0 - 52.0 %   MCV 85.5 78.0 - 100.0 fL   MCH 26.2 26.0 - 34.0 pg   MCHC 30.6 30.0 - 36.0 g/dL   RDW 17.1 (H) 11.5 - 15.5 %   Platelets 158 150 - 400 K/uL  Protime-INR     Status: Abnormal   Collection Time: 09/23/15  5:12 AM  Result Value Ref Range   Prothrombin Time 26.3 (H) 11.6 -  15.2 seconds   INR 2.45 (H) 0.00 - 1.49  Lactate dehydrogenase     Status: None   Collection Time: 09/23/15  5:12 AM  Result Value Ref Range   LDH 190 98 - 192 U/L  Basic metabolic panel     Status: Abnormal   Collection Time: 09/23/15  5:12 AM  Result Value Ref Range   Sodium 139 135 - 145 mmol/L   Potassium 4.0 3.5 - 5.1 mmol/L   Chloride 107 101 - 111 mmol/L   CO2 28 22 - 32 mmol/L   Glucose, Bld 94 65 - 99 mg/dL   BUN 10 6 - 20 mg/dL   Creatinine, Ser 0.90 0.61 - 1.24 mg/dL   Calcium 8.5 (L) 8.9 - 10.3 mg/dL   GFR calc non Af Amer >60 >60 mL/min   GFR calc Af Amer >60 >60 mL/min    Comment: (NOTE) The eGFR has been calculated using the CKD EPI equation. This calculation has not been validated in all clinical situations.  eGFR's persistently <60 mL/min signify possible Chronic Kidney Disease.    Anion gap 4 (L) 5 - 15  Lactate dehydrogenase     Status: None   Collection Time: 09/24/15  4:45 AM  Result Value Ref Range   LDH 186 98 - 192 U/L  Basic metabolic panel     Status: Abnormal   Collection Time: 09/24/15  4:45 AM  Result Value Ref Range   Sodium 141 135 - 145 mmol/L   Potassium 3.9 3.5 - 5.1 mmol/L   Chloride 108 101 - 111 mmol/L   CO2 28 22 - 32 mmol/L   Glucose, Bld 87 65 - 99 mg/dL   BUN 10 6 - 20 mg/dL   Creatinine, Ser 0.82 0.61 - 1.24 mg/dL   Calcium 8.7 (L) 8.9 - 10.3 mg/dL   GFR calc non Af Amer >60 >60 mL/min   GFR calc Af Amer >60 >60 mL/min    Comment: (NOTE) The eGFR has been calculated using the CKD EPI equation. This calculation has not been validated in all clinical situations. eGFR's persistently <60 mL/min signify possible Chronic Kidney Disease.    Anion gap 5 5 - 15     HEENT: normal Cardio: LVAD hum Resp: CTA B/L and unlabored GI: NT, ND Extremity:  Pulses positive and No Edema Skin:   Breakdown sacral gr 2 Neuro: Confused and Abnormal Motor 4/5 in BUE and BLE Musc/Skel:  Other no pain with UE or LE ROM Gen  NAD   Assessment/Plan: 1. Functional deficits secondary to deconditioning and encephalopathy which require 3+ hours per day of interdisciplinary therapy in a comprehensive inpatient rehab setting. Physiatrist is providing close team supervision and 24 hour management of active medical problems listed below. Physiatrist and rehab team continue to assess barriers to discharge/monitor patient progress toward functional and medical goals. FIM: Function - Bathing Position: Wheelchair/chair at sink Body parts bathed by patient: Right arm, Left arm, Chest, Front perineal area, Right upper leg, Left upper leg, Abdomen, Buttocks Body parts bathed by helper: Back Bathing not applicable: Left lower leg, Right lower leg Assist Level: Touching or steadying assistance(Pt > 75%)  Function- Upper Body Dressing/Undressing What is the patient wearing?: Pull over shirt/dress Pull over shirt/dress - Perfomed by patient: Thread/unthread right sleeve, Thread/unthread left sleeve, Put head through opening, Pull shirt over trunk Pull over shirt/dress - Perfomed by helper: Pull shirt over trunk, Put head through opening Assist Level: Supervision or verbal cues, Set up Set up : To obtain clothing/put away Function - Lower Body Dressing/Undressing What is the patient wearing?: Underwear, Non-skid slipper socks, Ted Hose, Pants Position: Wheelchair/chair at [employer] - Performed by patient: Pull underwear up/down Underwear - Performed by helper: Thread/unthread left underwear leg, Thread/unthread right underwear leg Pants- Performed by patient: Thread/unthread right pants leg, Thread/unthread left pants leg, Pull pants up/down Non-skid slipper socks- Performed by patient: Don/doff right sock, Don/doff left sock Non-skid slipper socks- Performed by helper: Don/doff right sock, Don/doff left sock TED Hose - Performed by helper: Don/doff right TED hose, Don/doff left TED hose Assist for footwear: Maximal  assist Assist for lower body dressing:  (Mod assist)  Function - Toileting Toileting steps completed by patient: Adjust clothing after toileting Toileting steps completed by helper: Adjust clothing prior to toileting, Performs perineal hygiene Toileting Assistive Devices: Grab bar or rail Assist level: Touching or steadying assistance (Pt.75%)  Function - Air cabin crew transfer assistive device: Grab bar Assist level to toilet: Touching or steadying assistance (Pt >  75%) Assist level from toilet: Touching or steadying assistance (Pt > 75%) Assist level to bedside commode (at bedside): Moderate assist (Pt 50 - 74%/lift or lower) Assist level from bedside commode (at bedside): Moderate assist (Pt 50 - 74%/lift or lower)  Function - Chair/bed transfer Chair/bed transfer method: Ambulatory Chair/bed transfer assist level: Touching or steadying assistance (Pt > 75%) Chair/bed transfer assistive device: Walker Chair/bed transfer details: Verbal cues for technique, Verbal cues for precautions/safety, Manual facilitation for weight shifting  Function - Locomotion: Wheelchair Type: Manual (bil LE propulsion) Wheelchair activity did not occur: Safety/medical concerns (sternal precautions) Max wheelchair distance: 150 Assist Level: Supervision or verbal cues Wheel 50 feet with 2 turns activity did not occur: Safety/medical concerns Assist Level: Supervision or verbal cues Wheel 150 feet activity did not occur: Safety/medical concerns Assist Level: Supervision or verbal cues Turns around,maneuvers to table,bed, and toilet,negotiates 3% grade,maneuvers on rugs and over doorsills: No Function - Locomotion: Ambulation Assistive device: No device Max distance: 150 Assist level: Touching or steadying assistance (Pt > 75%) Assist level: Touching or steadying assistance (Pt > 75%) Walk 50 feet with 2 turns activity did not occur: Safety/medical concerns Assist level: Touching or  steadying assistance (Pt > 75%) Walk 150 feet activity did not occur: Safety/medical concerns Assist level: Touching or steadying assistance (Pt > 75%) Walk 10 feet on uneven surfaces activity did not occur: Safety/medical concerns  Function - Comprehension Comprehension: Auditory Comprehension assist level: Understands basic 75 - 89% of the time/ requires cueing 10 - 24% of the time  Function - Expression Expression: Verbal Expression assist level: Expresses basic 75 - 89% of the time/requires cueing 10 - 24% of the time. Needs helper to occlude trach/needs to repeat words.  Function - Social Interaction Social Interaction assist level: Interacts appropriately 75 - 89% of the time - Needs redirection for appropriate language or to initiate interaction.  Function - Problem Solving Problem solving assist level: Solves basic 25 - 49% of the time - needs direction more than half the time to initiate, plan or complete simple activities  Function - Memory Memory assist level: Recognizes or recalls 25 - 49% of the time/requires cueing 50 - 75% of the time Patient normally able to recall (first 3 days only): Current season, That he or she is in a hospital  1. Debilitation secondary to acute on chronic systolic congestive heart failure/recent LVAD 62/13/0865 complicated by left pleural effusion with chest tube Continue CIR PT, OT, SLP 2. DVT Prophylaxis/Anticoagulation: Chronic Coumadin therapy. Monitor for any bleeding episodes 3. Pain Management: Ultram as needed 4. Mood: Remeron increase to 30 mg daily at bedtime, Zyprexa 5 mg daily at bedtime, d/c celexa 5. Neuropsych: This patient is capable of making decisions on his own behalf. 6. Skin/Wound Care: Routine skin checks 7. Fluids/Electrolytes/Nutrition: Routine I&O's with follow-up chemistries 8. Acute blood loss anemia. Follow-up CBC, Hemoglobins running in the 8 range 9. Hypertension/atrial fibrillation. Amiodarone 200 mg twice a day,  Aldactone 25 mg daily. Monitor with increased mobility 10. BPH. Flomax 0.4 mg daily. Check PVR 3 11. Decreased nutritional storage. Follow-up dietary consult, enc protein intake, trial beneprotein, Appreciate dietary f/u 12. Memory deficits, per wife this was not a pre-existing problem. May have some element of encephalopathy 13.  Freq urination- give oxybutnin low dose tonite LOS (Days) 5 A FACE TO FACE EVALUATION WAS PERFORMED  Ramon Brant E 09/24/2015, 8:17 AM

## 2015-09-24 NOTE — Progress Notes (Signed)
Speech Language Pathology Daily Session Note  Patient Details  Name: [name] MRN: [id number] Date of Birth: 09/15/1940  Today's Date: 09/24/2015 SLP Individual Time: 7517-0017 SLP Individual Time Calculation (min): 43 min  Short Term Goals: Week 1: SLP Short Term Goal 1 (Week 1): Pt will sustain his attention to a basic, familiar tasks for 3 minutes with min verbal cues for redirection.  SLP Short Term Goal 2 (Week 1): Pt will complete a basic, familiar task with min assist verbal cues for functional problem solving.   SLP Short Term Goal 3 (Week 1): Pt will utilize external aids to orient to date and situation with min assist verbal cues.    Skilled Therapeutic Interventions:  Pt was seen for skilled ST targeting cognitive goals.  Pt was able to return demonstration of connecting and disconnecting wires to LVAD battery x10 with max faded to min assist instructional cues.  Pt sustained his attention to task for ~20 minutes with supervision verbal cues for redirection.  Pt was able to identify x1 deficit s/p hospitalization and its impact on his functional independence with min assist question cues.   Pt compliant with sternal precautions during transfer in and out of bed with supervision cues.  Pt was left on toilet with daughter present at the end of today's therapy session.  Continue per current plan of care.    Function:  Eating Eating                 Cognition Comprehension Comprehension assist level: Understands basic 90% of the time/cues < 10% of the time  Expression   Expression assist level: Expresses basic 90% of the time/requires cueing < 10% of the time.  Social Interaction Social Interaction assist level: Interacts appropriately 75 - 89% of the time - Needs redirection for appropriate language or to initiate interaction.  Problem Solving Problem solving assist level: Solves basic 50 - 74% of the time/requires cueing 25 - 49% of the time  Memory Memory assist  level: Recognizes or recalls 25 - 49% of the time/requires cueing 50 - 75% of the time    Pain Pain Assessment Pain Assessment: No/denies pain  Therapy/Group: Individual Therapy  Dmario Russom, Melanee Spry 09/24/2015, 2:06 PM

## 2015-09-24 NOTE — Progress Notes (Signed)
Speech Language Pathology Daily Session Note  Patient Details  Name: [name] MRN: [id number] Date of Birth: 03-Feb-1941  Today's Date: 09/24/2015 SLP Individual Time: 0830-0900 SLP Individual Time Calculation (min): 30 min  Short Term Goals: Week 1: SLP Short Term Goal 1 (Week 1): Pt will sustain his attention to a basic, familiar tasks for 3 minutes with min verbal cues for redirection.  SLP Short Term Goal 2 (Week 1): Pt will complete a basic, familiar task with min assist verbal cues for functional problem solving.   SLP Short Term Goal 3 (Week 1): Pt will utilize external aids to orient to date and situation with min assist verbal cues.    Skilled Therapeutic Interventions: Skilled treatment session focused on addressing cognition goals. Upon SLP arrival patient was beginning tray set-up with breakfast of SLP facilitated session by providing assist with partially opening 3/5 containers to get patient started.  Wife assisted with cutting and Total assist set-up of biscuit.  SLP implemented a visual aid to assist with recall of location of LVAD battery pack connection, which appeared to positivley impact carryover and performance today; cues were faded from Max verbal and visual cues to Min verbal cues.  Patient demonstrated adequate sustained attention to task today and required no redirections for 30 minutes, will continue to monitor.  Continue with current plan of care.     Function:  Eating Eating   Modified Consistency Diet: No Eating Assist Level: Set up assist for   Eating Set Up Assist For: Opening containers;Cutting food       Cognition Comprehension Comprehension assist level: Understands basic 75 - 89% of the time/ requires cueing 10 - 24% of the time  Expression   Expression assist level: Expresses basic 75 - 89% of the time/requires cueing 10 - 24% of the time. Needs helper to occlude trach/needs to repeat words.  Social Interaction Social Interaction assist  level: Interacts appropriately 75 - 89% of the time - Needs redirection for appropriate language or to initiate interaction.  Problem Solving Problem solving assist level: Solves basic 25 - 49% of the time - needs direction more than half the time to initiate, plan or complete simple activities  Memory Memory assist level: Recognizes or recalls 25 - 49% of the time/requires cueing 50 - 75% of the time    Pain Pain Assessment Pain Assessment: No/denies pain  Therapy/Group: Individual Therapy  Charlane Ferretti., CCC-SLP [phone]  Adrin Julian 09/24/2015, 9:14 AM

## 2015-09-25 ENCOUNTER — Inpatient Hospital Stay (HOSPITAL_COMMUNITY): Payer: Medicare Other | Admitting: Occupational Therapy

## 2015-09-25 ENCOUNTER — Inpatient Hospital Stay (HOSPITAL_COMMUNITY): Payer: Medicare Other | Admitting: Physical Therapy

## 2015-09-25 ENCOUNTER — Inpatient Hospital Stay (HOSPITAL_COMMUNITY): Payer: Medicare Other | Admitting: Speech Pathology

## 2015-09-25 LAB — PROTIME-INR
INR: 2 — AB (ref 0.00–1.49)
PROTHROMBIN TIME: 22.6 s — AB (ref 11.6–15.2)

## 2015-09-25 LAB — URINALYSIS, ROUTINE W REFLEX MICROSCOPIC
BILIRUBIN URINE: NEGATIVE
GLUCOSE, UA: NEGATIVE mg/dL
HGB URINE DIPSTICK: NEGATIVE
Ketones, ur: NEGATIVE mg/dL
Leukocytes, UA: NEGATIVE
Nitrite: NEGATIVE
PROTEIN: NEGATIVE mg/dL
SPECIFIC GRAVITY, URINE: 1.008 (ref 1.005–1.030)
pH: 7 (ref 5.0–8.0)

## 2015-09-25 LAB — BASIC METABOLIC PANEL
Anion gap: 6 (ref 5–15)
BUN: 12 mg/dL (ref 6–20)
CALCIUM: 8.6 mg/dL — AB (ref 8.9–10.3)
CHLORIDE: 106 mmol/L (ref 101–111)
CO2: 28 mmol/L (ref 22–32)
CREATININE: 0.9 mg/dL (ref 0.61–1.24)
GFR calc Af Amer: >60 mL/min (ref >60)
GFR calc non Af Amer: >60 mL/min (ref >60)
GLUCOSE: 87 mg/dL (ref 65–99)
Potassium: 4 mmol/L (ref 3.5–5.1)
Sodium: 140 mmol/L (ref 135–145)

## 2015-09-25 LAB — CBC
HCT: 27.6 % — ABNORMAL LOW (ref 39.0–52.0)
Hemoglobin: 8.4 g/dL — ABNORMAL LOW (ref 13.0–17.0)
MCH: 25.9 pg — AB (ref 26.0–34.0)
MCHC: 30.4 g/dL (ref 30.0–36.0)
MCV: 85.2 fL (ref 78.0–100.0)
PLATELETS: 144 10*3/uL — AB (ref 150–400)
RBC: 3.24 MIL/uL — ABNORMAL LOW (ref 4.22–5.81)
RDW: 17.3 % — ABNORMAL HIGH (ref 11.5–15.5)
WBC: 3.7 10*3/uL — ABNORMAL LOW (ref 4.0–10.5)

## 2015-09-25 LAB — LACTATE DEHYDROGENASE: LDH: 182 U/L (ref 98–192)

## 2015-09-25 MED ORDER — WARFARIN SODIUM 3 MG PO TABS
3.0000 mg | ORAL_TABLET | Freq: Every day | ORAL | Status: DC
  Administered 2015-09-25 – 2015-09-27 (×3): 3 mg via ORAL
  Filled 2015-09-25 (×3): qty 1

## 2015-09-25 MED ORDER — OLANZAPINE 2.5 MG PO TABS
2.5000 mg | ORAL_TABLET | Freq: Every day | ORAL | Status: DC
  Administered 2015-09-25: 2.5 mg via ORAL
  Filled 2015-09-25: qty 1

## 2015-09-25 NOTE — Progress Notes (Signed)
Physical Therapy Session Note  Patient Details  Name: [name] MRN: [id number] Date of Birth: [birth year]  Today's Date: 09/25/2015 PT Individual Time: 1300-1400 PT Individual Time Calculation (min): 60 min   Short Term Goals: Week 1:  PT Short Term Goal 1 (Week 1): Pt will perform bed mobility on flat bed with min A while maintaining sternal precautions PT Short Term Goal 2 (Week 1): Pt will perform sit <> stand and stand pivot transfers with min A from various heights of surfaces while maintaining sternal precautions PT Short Term Goal 3 (Week 1): Pt will perform gait x 100' with RW and min A  PT Short Term Goal 4 (Week 1): Pt will negotiate 4 stairs (6 inches) with 2 rails with min A   Skilled Therapeutic Interventions/Progress Updates:    Pt received seated in recliner, denies pain and agreeable to treatment. Pt's daughter present for session. Pt instructed to verbalize instructions for changing LVAD from wall to battery power; daughter repetitively cueing pt throughout process giving little time for pt to answer questions or process cues. Educated daughter in allowing increased time for pt to respond; despite cueing daughter frequently over-cues pt throughout remainder of session. Gait to/from gym no AD and min guard. Gait within hall and gym to locate numbered targets in order; requires max cueing to recall sequencing, attention and memory. Assessed Berg Balance Scale with results as below. Patient demonstrates increased fall risk as noted by score of 30/56 on Berg Balance Scale.  (<36= high risk for falls, close to 100%; 37-45 significant >80%; 46-51 moderate >50%; 52-55 lower >25%). Stairs 2x12 with 2 handrails and S at 6-inch height. Returned to room as above; remained seated in recliner at completion of session, all needs in reach.    Therapy Documentation Precautions:  Precautions Precautions: Sternal Precaution Comments: LVAD, Call VAD Pager [phone]. Flow less than 2.5, or  sustained increase or decrease of 2.0 L Restrictions Weight Bearing Restrictions: No RUE Weight Bearing: Non weight bearing LUE Weight Bearing: Non weight bearing Pain: Pain Assessment Pain Assessment: No/denies pain Balance: Balance Balance Assessed: Yes Standardized Balance Assessment Standardized Balance Assessment: Berg Balance Test Berg Balance Test Sit to Stand: Needs minimal aid to stand or to stabilize (d/t sternal precautions) Standing Unsupported: Able to stand 2 minutes with supervision Sitting with Back Unsupported but Feet Supported on Floor or Stool: Able to sit safely and securely 2 minutes Stand to Sit: Sits safely with minimal use of hands Transfers: Needs one person to assist Standing Unsupported with Eyes Closed: Able to stand 10 seconds with supervision Standing Ubsupported with Feet Together: Able to place feet together independently and stand for 1 minute with supervision From Standing, Reach Forward with Outstretched Arm: Reaches forward but needs supervision From Standing Position, Pick up Object from Floor: Able to pick up shoe, needs supervision From Standing Position, Turn to Look Behind Over each Shoulder: Needs supervision when turning Turn 360 Degrees: Needs close supervision or verbal cueing Standing Unsupported, Alternately Place Feet on Step/Stool: Able to complete >2 steps/needs minimal assist Standing Unsupported, One Foot in Front: Able to plae foot ahead of the other independently and hold 30 seconds Standing on One Leg: Tries to lift leg/unable to hold 3 seconds but remains standing independently Total Score: 30   See Function Navigator for Current Functional Status.   Therapy/Group: Individual Therapy  [name] 09/25/2015, 2:03 PM

## 2015-09-25 NOTE — Progress Notes (Signed)
ANTICOAGULATION CONSULT NOTE - Follow Up Consult  Pharmacy Consult for Coumadin Indication: LVAD  Allergies  Allergen Reactions  . Phenergan [Promethazine Hcl] Nausea And Vomiting  . Lasix [Furosemide] Other (See Comments)    "Dropped blood pressure too low"  . Morphine And Related Other (See Comments)    Goes crazy     Patient Measurements: Weight: 165 lb 8 oz (75.07 kg) (one pillow, sheet and blanket)  Vital Signs: Temp: 98.2 F (36.8 C) (07/19 0505) Temp Source: Oral (07/19 0505) Pulse Rate: 66 (07/19 0505)  Labs:  Recent Labs  09/23/15 0512 09/24/15 0445 09/25/15 0445  HGB  --   --  8.4*  HCT  --   --  27.6*  PLT  --   --  144*  LABPROT 26.3* 21.9* 22.6*  INR 2.45* 1.92* 2.00*  CREATININE 0.90 0.82 0.90    Estimated Creatinine Clearance: 77.6 mL/min (by C-G formula based on Cr of 0.9).  Assessment: 74yom s/p LVAD 6/23 continues on coumadin (started low dose per MD on 6/24, pharmacy dosing starting 7/3). INR trended down yesterday and dose was increased.  INR 1.92>2 after warfarin 4mg  dose.   He continues on po amiodarone. LDH stable, CBC stable today.  No bleeding.  Goal of Therapy:  INR 2-2.5 Monitor platelets by anticoagulation protocol: yes   Plan:  1) Coumadin 3mg  daily 2) Daily INR  Leota Sauers Pharm.D. CPP, BCPS Clinical Pharmacist [phone] 09/25/2015 2:01 PM

## 2015-09-25 NOTE — Progress Notes (Signed)
Speech Language Pathology Daily Session Note  Patient Details  Name: [name] MRN: [id number] Date of Birth: Dec 30, 1940  Today's Date: 09/25/2015 SLP Individual Time: 1135-1200 SLP Individual Time Calculation (min): 25 min  Short Term Goals: Week 1: SLP Short Term Goal 1 (Week 1): Pt will sustain his attention to a basic, familiar tasks for 3 minutes with min verbal cues for redirection.  SLP Short Term Goal 2 (Week 1): Pt will complete a basic, familiar task with min assist verbal cues for functional problem solving.   SLP Short Term Goal 3 (Week 1): Pt will utilize external aids to orient to date and situation with min assist verbal cues.    Skilled Therapeutic Interventions:  Pt was seen for skilled ST targeting cognitive goals. Pt required supervision verbal cues to orient to place; max assist needed to orient to situation.  Pt was able to recall at least 1 detail from previous therapy session with min question cues.  Pt was able to sustain his attention to functional conversations for 15 minutes with supervision verbal cues for redirection. Overall pt's conversational exchanges are much more coherent and pt is able to maintain topic for >6 turns.  Pt was left in room with daughter at bedside.  Continue per current plan of care.     Function:  Eating Eating               Cognition Comprehension Comprehension assist level: Understands basic 90% of the time/cues < 10% of the time  Expression   Expression assist level: Expresses basic 90% of the time/requires cueing < 10% of the time.  Social Interaction Social Interaction assist level: Interacts appropriately 75 - 89% of the time - Needs redirection for appropriate language or to initiate interaction.  Problem Solving Problem solving assist level: Solves basic 75 - 89% of the time/requires cueing 10 - 24% of the time  Memory Memory assist level: Recognizes or recalls 25 - 49% of the time/requires cueing 50 - 75% of the  time    Pain Pain Assessment Pain Assessment: No/denies pain  Therapy/Group: Individual Therapy  Tayia Stonesifer, Melanee Spry 09/25/2015, 4:05 PM

## 2015-09-25 NOTE — Progress Notes (Signed)
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 Implant hospitalization was from 08/22/15 - 09/19/15.   Vital signs: Temo: 97.6 HR: 57 Doppler MAP: 82 O2 Sat: 96% on RA Wt in lbs:  179 > 170 > 168 > 167 > 161 > 157 > 165   LVAD interrogation reveals:  Speed: 9200 Flow: 5.1 Power: 5.5w PI: 5.4 Alarms: none Events:  7 PI events since speed dropped from 9400 to 9200; 22 PI events on previous day Fixed speed:  9200 Low speed limit: 8600  Drive Line:  C/D/I. Daily dressing changes using guaze dressing with Aquacel silver strip on exit site being maintained per wife. Dressing supplies at bedside.     Labs:  LDH trend:  196 > 266 > 188 > 190 > 182  INR trend: 2.12 > 2.47 > 3.0 > 2.95 > 2.45 > 2.0  Hgb:  8.5 > 9.0 > 8.3 > 8.4  Antithrombotic Management:   INR goal: 2.0 - 2.5 ASA 81 mg    Plan/Recommendations:  1. No VAD alarms reported overnight or this am. 2. Pushing PO fluids due to drop in PI with orhtostatic changes.  3. Patient currently drinking Ensure twice daily and Boost once daily. Explained to patient and daughter, these contain       Vitamin K and interact with Coumadin. Stressed he will need to maintain consistency with these supplements when     discharged home to ensure adequate INR management. Both verbalized understanding of same. 4. Nurse will attempt to obtain loveseat/recliner to improve wife's comfort.    Hessie Diener, RN VAD Coordinator  Office: [phone] 24/7 Emergency VAD Pager: [phone]

## 2015-09-25 NOTE — Progress Notes (Signed)
Occupational Therapy Session Note  Patient Details  Name: [name] MRN: [id number] Date of Birth: [birth year]  Today's Date: 09/25/2015 OT Individual Time: 4801-6553 OT Individual Time Calculation (min): 61 min    Short Term Goals: Week 1:  OT Short Term Goal 1 (Week 1): Pt will complete UB dressing with supervision while adhering to sternal precautions with min cues OT Short Term Goal 2 (Week 1): Pt will complete toilet transfers with min assist while adhering to sternal precautions with min cues OT Short Term Goal 3 (Week 1): Pt will complete LB dressing with min assist at sit > stand level while adhering to sternal precautions OT Short Term Goal 4 (Week 1): Pt will complete bathing with min assist at sit > stand levle while adhering to sternal precautions OT Short Term Goal 5 (Week 1): Pt will tolerate standing for 4 mins while completing grooming tasks with supervision  Skilled Therapeutic Interventions/Progress Updates:    Treatment session with focus on sit > stand, standing balance, activity tolerance, recall of sternal precaution and LVAD parameters.  Pt received upright in recliner reporting sleeping better last night.  Bathing and dressing completed at sit > stand level at sink with pt continuing to require mod verbal cues for hand placement with sit > stand due to sternal precautions.  Cues for orientation of shirt as pt attempted to gather at head hole and then thread arm through head hold.  Pt continues to demonstrate difficulty with orientation and sequencing of donning underwear and shorts requiring multimodal cues for setup and sequencing.  Min guard to supervision during standing this session, still requiring mod assist for sit > stand.  Educated daughter, [name], on sternal precautions and proper technique for sit > stand to decrease use of UE.  Discussed use of BSC over toilet to increase height and increase pt independence with toilet transfers, plan to further address  during next session.  Engaged in education regarding LVAD power source and need to be on wall power when sleeping or napping.  Utilized visual aid to assist with recall of technique to change from battery source to wall source with pt requiring max multimodal cues for technique and sequencing.  Continue to recommend 24 hr supervision due to difficulty with LVAD management and safety.  Therapy Documentation Precautions:  Precautions Precautions: Sternal Precaution Comments: LVAD, Call VAD Pager [phone]. Flow less than 2.5, or sustained increase or decrease of 2.0 L Restrictions Weight Bearing Restrictions: No RUE Weight Bearing: Non weight bearing LUE Weight Bearing: Non weight bearing Pain:  Pt with no c/o pain  See Function Navigator for Current Functional Status.   Therapy/Group: Individual Therapy  Rosalio Loud 09/25/2015, 10:35 AM

## 2015-09-25 NOTE — Progress Notes (Signed)
Speech Language Pathology Daily Session Note  Patient Details  Name: [name] MRN: [id number] Date of Birth: 30-Jan-1941  Today's Date: 09/25/2015 SLP Individual Time: 0830-0900 SLP Individual Time Calculation (min): 30 min  Short Term Goals: Week 1: SLP Short Term Goal 1 (Week 1): Pt will sustain his attention to a basic, familiar tasks for 3 minutes with min verbal cues for redirection.  SLP Short Term Goal 2 (Week 1): Pt will complete a basic, familiar task with min assist verbal cues for functional problem solving.   SLP Short Term Goal 3 (Week 1): Pt will utilize external aids to orient to date and situation with min assist verbal cues.   SLP Short Term Goal 4 (Week 1): Pt will return demonstration of how to connect and disconnect from battery power of LVAD with mod assist verbal and tactile cues.   Skilled Therapeutic Interventions: Skilled treatment session focused on cognitive goals. Upon arrival, patient sitting EOB consuming his breakfast. Patient recalled 1 goal for physical therapy but required Max A verbal cues to recall goals of OT/SLP. Patient demonstrated how to appropriately stand while adhering to his sternal precautions with Min A question cues. Patient sustained attention to all tasks with supervision verbal cues and had a functional conversation about topic of interest with supervision verbal cues. Patient left sitting EOB. Continue with current plan of care.    Function:  Eating Eating     Eating Assist Level: Set up assist for   Eating Set Up Assist For: Opening containers;Cutting food       Cognition Comprehension Comprehension assist level: Understands basic 90% of the time/cues < 10% of the time  Expression   Expression assist level: Expresses basic 90% of the time/requires cueing < 10% of the time.  Social Interaction Social Interaction assist level: Interacts appropriately 75 - 89% of the time - Needs redirection for appropriate language or to  initiate interaction.  Problem Solving Problem solving assist level: Solves basic 75 - 89% of the time/requires cueing 10 - 24% of the time  Memory Memory assist level: Recognizes or recalls 25 - 49% of the time/requires cueing 50 - 75% of the time    Pain Pain Assessment Pain Assessment: No/denies pain  Therapy/Group: Individual Therapy  [name] 09/25/2015, 5:25 PM

## 2015-09-25 NOTE — Progress Notes (Signed)
  Subjective: Patient making excellent progress Surgical incisions clean and dry, minimal edema Last chest x-ray one week ago following chest tube placement for left pleural effusion We'll check x-ray once more before discharge to rule out recurrence of effusion VAD parameters this a.m. all satisfactory Objective: Vital signs in last 24 hours: Temp:  [98.2 F (36.8 C)-98.4 F (36.9 C)] 98.2 F (36.8 C) (07/19 0505) Pulse Rate:  [66] 66 (07/19 0505) Cardiac Rhythm:  [-]  Resp:  [16] 16 (07/19 0505) SpO2:  [96 %] 96 % (07/19 0505) Weight:  [165 lb 8 oz (75.07 kg)] 165 lb 8 oz (75.07 kg) (07/19 0505)  Hemodynamic parameters for last 24 hours:  stable afebrile  Intake/Output from previous day: 07/18 0701 - 07/19 0700 In: 490 [P.O.:480; I.V.:10] Out: 950 [Urine:950] Intake/Output this shift: Total I/O In: -  Out: 375 [Urine:375]       Exam    General- alert and comfortable   Lungs- clear without rales, wheezes   Cor- continuous VAD hum   Abdomen- soft, non-tender   Extremities - warm, non-tender, minimal edema   Neuro- oriented, appropriate, no focal weakness   Lab Results:  Recent Labs  09/25/15 0445  WBC 3.7*  HGB 8.4*  HCT 27.6*  PLT 144*   BMET:  Recent Labs  09/24/15 0445 09/25/15 0445  NA 141 140  K 3.9 4.0  CL 108 106  CO2 28 28  GLUCOSE 87 87  BUN 10 12  CREATININE 0.82 0.90  CALCIUM 8.7* 8.6*    PT/INR:  Recent Labs  09/25/15 0445  LABPROT 22.6*  INR 2.00*   ABG    Component Value Date/Time   PHART 7.390 09/01/2015 0345   HCO3 27.4* 09/01/2015 0345   TCO2 28 09/06/2015 1634   ACIDBASEDEF 1.0 08/22/2015 0945   O2SAT 71.8 09/19/2015 0457   CBG (last 3)  No results for input(s): GLUCAP in the last 72 hours.  Assessment/Plan: S/P   Continue current care Appreciate inpatient rehabilitation  - very beneficial care for the patient   LOS: 6 days    Kathlee Nations Trigt III 09/25/2015

## 2015-09-25 NOTE — Progress Notes (Signed)
Patient ID: [name], male   DOB: 12-05-1940, 75 y.o.   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 Normal cors. EF 10% with low cardiac output.   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   Foley placed 7/3 for urinary retention. S/P CT with 1700 cc fluid out.   Ramp echo 7/10 with increase in speed to 9400 rpm.   Speed decreased to 9200 09/24/15. PI events went from 22 in a day to 7 PI events  Feels better with speed decreased. Appetite improving per family. Tentative discharge next week per patient (CIR had discharge planning meeting this am)  Weights all over the place within a range of 20 lbs.   LVAD INTERROGATION:  HeartMate II LVAD:  Flow 5.1 liters/min, speed 9400, power 5.7, PI 6.3    Objective:    Vital Signs:   Temp:  [97.6 F (36.4 C)-98.4 F (36.9 C)] 97.6 F (36.4 C) (07/19 1300) Pulse Rate:  [57-66] 57 (07/19 1300) Resp:  [16-17] 17 (07/19 1300) SpO2:  [96 %] 96 % (07/19 0505) Weight:  [165 lb 8 oz (75.07 kg)] 165 lb 8 oz (75.07 kg) (07/19 0505) Last BM Date: 09/24/15 Mean arterial Pressure 88  Intake/Output:   Intake/Output Summary (Last 24 hours) at 09/25/15 1505 Last data filed at 09/25/15 0816  Gross per 24 hour  Intake    130 ml  Output   1325 ml  Net  -1195 ml     Physical Exam: General: In the chair. NAD.  HEENT: normal  Neck: supple. JVP ~6-7 cm Cor: LVAD hum.  Lungs: Decrease basilar sounds Abdomen: soft, NT, ND, no HSM. No bruits or masses. +BS Driveline site ok. Securement device in place. Extremities: no cyanosis, clubbing, rash.  1+ ankle edema. TED hose in place Neuro: Alert and oriented to person and place  Labs: Basic Metabolic Panel:  Recent Labs Lab 09/21/15 0505 09/22/15 0417 09/23/15 0512 09/24/15 0445 09/25/15 0445  NA 141 139 139 141 140  K 3.9 3.8 4.0 3.9 4.0  CL 104 108 107 108 106  CO2 GLUCOSE  90 94 94 87 87  BUN CREATININE 0.88 0.91 0.90 0.82 0.90  CALCIUM 8.9 8.7* 8.5* 8.7* 8.6*    Liver Function Tests:  Recent Labs Lab 09/20/15 0810  AST 19  ALT 22  ALKPHOS 67  BILITOT 0.8  PROT 5.5*  ALBUMIN 2.5*   No results for input(s): LIPASE, AMYLASE in the last 168 hours. No results for input(s): AMMONIA in the last 168 hours.  CBC:  Recent Labs Lab 09/19/15 0453 09/20/15 0810 09/22/15 0417 09/25/15 0445  WBC 4.1 5.0 3.9* 3.7*  NEUTROABS  --  3.4  --   --   HGB 8.5* 9.0* 8.3* 8.4*  HCT 28.5* 29.6* 27.1* 27.6*  MCV 86.1 86.0 85.5 85.2  PLT 176 182 158 144*    INR:  Recent Labs Lab 09/21/15 0505 09/22/15 0417 09/23/15 0512 09/24/15 0445 09/25/15 0445  INR 3.00* 2.95* 2.45* 1.92* 2.00*    Other results:  EKG:   Imaging: No results found.   Medications:     Scheduled Medications: . amiodarone  200 mg Oral BID  . aspirin EC  81 mg Oral Daily  . bisacodyl  10 mg Oral Daily   Or  . bisacodyl  10 mg Rectal Daily  .  docusate sodium  100 mg Oral BID  . feeding supplement  1 Container Oral Q1500  . feeding supplement (ENSURE ENLIVE)  237 mL Oral BID BM  . ferrous fumarate-b12-vitamic C-folic acid  1 capsule Oral TID PC  . mirtazapine  30 mg Oral QHS  . OLANZapine  2.5 mg Oral QHS  . oxybutynin  2.5 mg Oral QHS  . pantoprazole  40 mg Oral Daily  . protein supplement  1 scoop Oral TID WC  . sodium chloride flush  10-40 mL Intracatheter Q12H  . tamsulosin  0.4 mg Oral Daily  . warfarin  3 mg Oral q1800  . Warfarin - Pharmacist Dosing Inpatient   Does not apply q1800    Infusions:    PRN Medications: acetaminophen, ondansetron **OR** ondansetron (ZOFRAN) IV, polyvinyl alcohol, sodium chloride flush, sorbitol, traMADol   Assessment:   1. 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 EF 15%. Cath 8/14 with normal cors. 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  2. Frequent PVCs 3. Severe malnutrition-  Prealbumin 14.7 on 6/20 4. Acute post-op delirium/sundowning 5. Urinary Retention requiring Foley 6. Left pleural effusion s/p chest tube  Plan/Discussion:    Mentation continues to improve, as does appetite.   Lasix and spiro remain on hold.  With oral intake improving will likely add spiro back in next day or two.  May initially be sufficient diuresis for him.    Pharmacy dosing coumadin, goal INR 2-2.5.    MAP stable.   Isolated power spike noted (10.4) week of 09/18/15. Event log was reviewed thought to be pump burn in.  LDH stable at 186 today.   Appreciate CIR and input. Continue therapy. We will follow along with recommendations.   I reviewed the LVAD parameters from today, and compared the results to the patient's prior recorded data.  No programming changes were made.  The LVAD is functioning within specified parameters.  The patient performs LVAD self-test daily.  LVAD interrogation was negative for any significant power changes, alarms or PI events/speed drops.  LVAD equipment check completed and is in good working order.  Back-up equipment present.   LVAD education done on emergency procedures and precautions and reviewed exit site care.  Length of Stay: [address]  Luane School 09/25/2015, 3:05 PM  VAD Team --- VAD ISSUES ONLY--- Pager [phone] (7am - 7am)  Advanced Heart Failure Team  Pager [phone] (M-F; 7a - 4p)  Please contact [facility] for night-coverage after hours (4p -7a ) and weekends on [email or web address]  Agree with the above note.  Speed decreased yesterday, PIs were falling with standing considerably.  This has improved today.  No changes today.   Marca Ancona 09/25/2015 4:49 PM

## 2015-09-25 NOTE — Progress Notes (Signed)
4W nurse paged VAD coordinator to report pt experiencing VAD alarms. States she has seen (on system monitor only) "power cable disconnect", "pump off", and "driveline disconnect".  She verified green light is "on" meaning pump is running; patient is asymptomatic, lying in bed. Asked nurse to connect patient to batteries, which is an ungrounded power source. Nurse reports controller still indicating "power cable disconnect".    VAD coordinator performed equipment check, no alarms noted on history screen. No bent pins on controller power leads, patient cable, or battery clips. 20' patient cable with adequate voltage. Switched power several times from batteries to PM with no alarms or advisories noted.  Nurse reports sustained "power cable disconnect" was noted on controller history. Explained this is normal, this advisory is prompted with each change in power sources. Family at bedside and reported hearing some alarms earlier today around 13:00 - no alarms noted on history.     LVAD Exit Site:  VAD dressing removed and site care performed using sterile technique. Drive line exit site cleaned with Chlora prep applicators x 2, allowed to dry, and gauze dressing with aquacel strip re-applied. Exit site with moderate tissue ingrowth. The velour is fully implanted at exit site. Small amount yellow drainage with no redness, tenderness,or foul odor noted; one suture remains intact.  Drive line anchor re-applied. Driveline dressing is being changed daily per sterile technique.

## 2015-09-25 NOTE — Progress Notes (Signed)
Occupational Therapy Session Note  Patient Details  Name: [name] MRN: [id number] Date of Birth: 10/21/1940  Today's Date: 09/25/2015 OT Individual Time: 1430-1500 OT Individual Time Calculation (min): 30 min    Short Term Goals: Week 1:  OT Short Term Goal 1 (Week 1): Pt will complete UB dressing with supervision while adhering to sternal precautions with min cues OT Short Term Goal 2 (Week 1): Pt will complete toilet transfers with min assist while adhering to sternal precautions with min cues OT Short Term Goal 3 (Week 1): Pt will complete LB dressing with min assist at sit > stand level while adhering to sternal precautions OT Short Term Goal 4 (Week 1): Pt will complete bathing with min assist at sit > stand levle while adhering to sternal precautions OT Short Term Goal 5 (Week 1): Pt will tolerate standing for 4 mins while completing grooming tasks with supervision  Skilled Therapeutic Interventions/Progress Updates:    1:1 Engaged in therapeutic activity to address dynamic standing balance and activity tolerance. Pt ambulated to the gym with steadying A. Then engaged in navigating a busy hallway with cones as obstacles in hallway while trying to located numbered cards 1-12 (out of order). Pt able to demonstrate alternating attention with min -mod cuing but required mod -max cuing for organization of task to keep track of number pt was looking for. Pt required more than reasonable amt of time to complete task and needing to pace the hallway multiple times to locate all numbers. Pt ambulated back to room passing a ball back and forth with steadying. Required max A to also name foods while passing ball and walking. Left in room with daughter and grandchildren.   Therapy Documentation Precautions:  Precautions Precautions: Sternal Precaution Comments: LVAD, Call VAD Pager [phone]. Flow less than 2.5, or sustained increase or decrease of 2.0 L Restrictions Weight Bearing  Restrictions: No RUE Weight Bearing: Non weight bearing LUE Weight Bearing: Non weight bearing Pain:  no c/o pain  See Function Navigator for Current Functional Status.   Therapy/Group: Individual Therapy  Roney Mans [facility] 09/25/2015, 9:38 PM

## 2015-09-25 NOTE — Plan of Care (Signed)
Problem: RH SKIN INTEGRITY Goal: RH STG SKIN FREE OF INFECTION/BREAKDOWN Free of skin breakdown and infection mod assist  Outcome: Not Progressing Patient requires max to total assist Goal: RH STG MAINTAIN SKIN INTEGRITY WITH ASSISTANCE STG Maintain Skin Integrity With mod Assistance.  Outcome: Not Progressing Requires total assist

## 2015-09-25 NOTE — Progress Notes (Signed)
Subjective/Complaints: Was up voiding 3-4 times last noc also had a hard BM, tried not to strain VOiding normal volumes  ROS- no CP, SOB, N/V/D  Objective: Vital Signs: Blood pressure 74/55, pulse 66, temperature 98.2 F (36.8 C), temperature source Oral, resp. rate 16, weight 75.07 kg (165 lb 8 oz), SpO2 96 %. No results found. Results for orders placed or performed during the hospital encounter of 09/19/15 (from the past 72 hour(s))  Protime-INR     Status: Abnormal   Collection Time: 09/23/15  5:12 AM  Result Value Ref Range   Prothrombin Time 26.3 (H) 11.6 - 15.2 seconds   INR 2.45 (H) 0.00 - 1.49  Lactate dehydrogenase     Status: None   Collection Time: 09/23/15  5:12 AM  Result Value Ref Range   LDH 190 98 - 192 U/L  Basic metabolic panel     Status: Abnormal   Collection Time: 09/23/15  5:12 AM  Result Value Ref Range   Sodium 139 135 - 145 mmol/L   Potassium 4.0 3.5 - 5.1 mmol/L   Chloride 107 101 - 111 mmol/L   CO2 28 22 - 32 mmol/L   Glucose, Bld 94 65 - 99 mg/dL   BUN 10 6 - 20 mg/dL   Creatinine, Ser 0.90 0.61 - 1.24 mg/dL   Calcium 8.5 (L) 8.9 - 10.3 mg/dL   GFR calc non Af Amer >60 >60 mL/min   GFR calc Af Amer >60 >60 mL/min    Comment: (NOTE) The eGFR has been calculated using the CKD EPI equation. This calculation has not been validated in all clinical situations. eGFR's persistently <60 mL/min signify possible Chronic Kidney Disease.    Anion gap 4 (L) 5 - 15  Protime-INR     Status: Abnormal   Collection Time: 09/24/15  4:45 AM  Result Value Ref Range   Prothrombin Time 21.9 (H) 11.6 - 15.2 seconds   INR 1.92 (H) 0.00 - 1.49  Lactate dehydrogenase     Status: None   Collection Time: 09/24/15  4:45 AM  Result Value Ref Range   LDH 186 98 - 192 U/L  Basic metabolic panel     Status: Abnormal   Collection Time: 09/24/15  4:45 AM  Result Value Ref Range   Sodium 141 135 - 145 mmol/L   Potassium 3.9 3.5 - 5.1 mmol/L   Chloride 108 101 - 111  mmol/L   CO2 28 22 - 32 mmol/L   Glucose, Bld 87 65 - 99 mg/dL   BUN 10 6 - 20 mg/dL   Creatinine, Ser 0.82 0.61 - 1.24 mg/dL   Calcium 8.7 (L) 8.9 - 10.3 mg/dL   GFR calc non Af Amer >60 >60 mL/min   GFR calc Af Amer >60 >60 mL/min    Comment: (NOTE) The eGFR has been calculated using the CKD EPI equation. This calculation has not been validated in all clinical situations. eGFR's persistently <60 mL/min signify possible Chronic Kidney Disease.    Anion gap 5 5 - 15  Protime-INR     Status: Abnormal   Collection Time: 09/25/15  4:45 AM  Result Value Ref Range   Prothrombin Time 22.6 (H) 11.6 - 15.2 seconds   INR 2.00 (H) 0.00 - 1.49  Lactate dehydrogenase     Status: None   Collection Time: 09/25/15  4:45 AM  Result Value Ref Range   LDH 182 98 - 192 U/L  Basic metabolic panel     Status: Abnormal  Collection Time: 09/25/15  4:45 AM  Result Value Ref Range   Sodium 140 135 - 145 mmol/L   Potassium 4.0 3.5 - 5.1 mmol/L   Chloride 106 101 - 111 mmol/L   CO2 28 22 - 32 mmol/L   Glucose, Bld 87 65 - 99 mg/dL   BUN 12 6 - 20 mg/dL   Creatinine, Ser 0.90 0.61 - 1.24 mg/dL   Calcium 8.6 (L) 8.9 - 10.3 mg/dL   GFR calc non Af Amer >60 >60 mL/min   GFR calc Af Amer >60 >60 mL/min    Comment: (NOTE) The eGFR has been calculated using the CKD EPI equation. This calculation has not been validated in all clinical situations. eGFR's persistently <60 mL/min signify possible Chronic Kidney Disease.    Anion gap 6 5 - 15  CBC     Status: Abnormal   Collection Time: 09/25/15  4:45 AM  Result Value Ref Range   WBC 3.7 (L) 4.0 - 10.5 K/uL   RBC 3.24 (L) 4.22 - 5.81 MIL/uL   Hemoglobin 8.4 (L) 13.0 - 17.0 g/dL   HCT 27.6 (L) 39.0 - 52.0 %   MCV 85.2 78.0 - 100.0 fL   MCH 25.9 (L) 26.0 - 34.0 pg   MCHC 30.4 30.0 - 36.0 g/dL   RDW 17.3 (H) 11.5 - 15.5 %   Platelets 144 (L) 150 - 400 K/uL     HEENT: normal Cardio: LVAD hum Resp: CTA B/L and unlabored GI: NT, ND Extremity:   Pulses positive and No Edema Skin:   Breakdown sacral gr 2 Neuro: Confused and Abnormal Motor 4/5 in BUE and BLE Musc/Skel:  Other no pain with UE or LE ROM Gen NAD   Assessment/Plan: 1. Functional deficits secondary to deconditioning and encephalopathy which require 3+ hours per day of interdisciplinary therapy in a comprehensive inpatient rehab setting. Physiatrist is providing close team supervision and 24 hour management of active medical problems listed below. Physiatrist and rehab team continue to assess barriers to discharge/monitor patient progress toward functional and medical goals. FIM: Function - Bathing Position: Wheelchair/chair at sink Body parts bathed by patient: Right arm, Left arm, Chest, Front perineal area, Right upper leg, Left upper leg, Abdomen, Buttocks, Right lower leg, Left lower leg Body parts bathed by helper: Back Bathing not applicable: Left lower leg, Right lower leg Assist Level: Touching or steadying assistance(Pt > 75%)  Function- Upper Body Dressing/Undressing What is the patient wearing?: Pull over shirt/dress Pull over shirt/dress - Perfomed by patient: Thread/unthread right sleeve, Thread/unthread left sleeve, Pull shirt over trunk Pull over shirt/dress - Perfomed by helper: Put head through opening Assist Level: Touching or steadying assistance(Pt > 75%) Set up : To obtain clothing/put away Function - Lower Body Dressing/Undressing What is the patient wearing?: Underwear, Pants, Non-skid slipper socks, Ted Hose Position: Wheelchair/chair at [employer] - Performed by patient: Pull underwear up/down, Thread/unthread left underwear leg Underwear - Performed by helper: Thread/unthread right underwear leg Pants- Performed by patient: Thread/unthread right pants leg, Pull pants up/down Pants- Performed by helper: Thread/unthread left pants leg Non-skid slipper socks- Performed by patient: Don/doff right sock, Don/doff left sock Non-skid slipper  socks- Performed by helper: Don/doff right sock, Don/doff left sock TED Hose - Performed by helper: Don/doff right TED hose, Don/doff left TED hose Assist for footwear: Partial/moderate assist Assist for lower body dressing: Touching or steadying assistance (Pt > 75%) (Mod assist sit > stand)  Function - Toileting Toileting steps completed by patient: Adjust clothing  after toileting Toileting steps completed by helper: Adjust clothing prior to toileting, Performs perineal hygiene Toileting Assistive Devices: Grab bar or rail Assist level: Touching or steadying assistance (Pt.75%)  Function - Toilet Transfers Toilet transfer assistive device: Grab bar Assist level to toilet: Touching or steadying assistance (Pt > 75%) Assist level from toilet: Touching or steadying assistance (Pt > 75%) Assist level to bedside commode (at bedside): Moderate assist (Pt 50 - 74%/lift or lower) Assist level from bedside commode (at bedside): Moderate assist (Pt 50 - 74%/lift or lower)  Function - Chair/bed transfer Chair/bed transfer method: Ambulatory Chair/bed transfer assist level: Touching or steadying assistance (Pt > 75%) Chair/bed transfer assistive device: Walker Chair/bed transfer details: Verbal cues for technique, Verbal cues for precautions/safety  Function - Locomotion: Wheelchair Type: Manual (bil LE propulsion) Wheelchair activity did not occur: Safety/medical concerns (sternal precautions) Max wheelchair distance: 150 Assist Level: Supervision or verbal cues Wheel 50 feet with 2 turns activity did not occur: Safety/medical concerns Assist Level: Supervision or verbal cues Wheel 150 feet activity did not occur: Safety/medical concerns Assist Level: Supervision or verbal cues Turns around,maneuvers to table,bed, and toilet,negotiates 3% grade,maneuvers on rugs and over doorsills: No Function - Locomotion: Ambulation Assistive device: No device Max distance: 175 Assist level: Touching or  steadying assistance (Pt > 75%) Assist level: Touching or steadying assistance (Pt > 75%) Walk 50 feet with 2 turns activity did not occur: Safety/medical concerns Assist level: Touching or steadying assistance (Pt > 75%) Walk 150 feet activity did not occur: Safety/medical concerns Assist level: Touching or steadying assistance (Pt > 75%) Walk 10 feet on uneven surfaces activity did not occur: Safety/medical concerns  Function - Comprehension Comprehension: Auditory Comprehension assist level: Understands basic 90% of the time/cues < 10% of the time  Function - Expression Expression: Verbal Expression assist level: Expresses basic 90% of the time/requires cueing < 10% of the time.  Function - Social Interaction Social Interaction assist level: Interacts appropriately 75 - 89% of the time - Needs redirection for appropriate language or to initiate interaction.  Function - Problem Solving Problem solving assist level: Solves basic 50 - 74% of the time/requires cueing 25 - 49% of the time  Function - Memory Memory assist level: Recognizes or recalls 25 - 49% of the time/requires cueing 50 - 75% of the time Patient normally able to recall (first 3 days only): Current season, That he or she is in a hospital  1. Debilitation secondary to acute on chronic systolic congestive heart failure/recent LVAD 08/30/2015 complicated by left pleural effusion with chest tube Continue CIR PT, OT, SLP Team conference today please see physician documentation under team conference tab, met with team face-to-face to discuss problems,progress, and goals. Formulized individual treatment plan based on medical history, underlying problem and comorbidities. 2. DVT Prophylaxis/Anticoagulation: Chronic Coumadin therapy. Monitor for any bleeding episodes 3. Pain Management: Ultram as needed 4. Mood: Remeron increase to 30 mg daily at bedtime, Zyprexa 5 mg daily at bedtime, d/c celexa 5. Neuropsych: This patient is  capable of making decisions on his own behalf. 6. Skin/Wound Care: Routine skin checks 7. Fluids/Electrolytes/Nutrition: Routine I&O's with follow-up chemistries 8. Acute blood loss anemia. Follow-up CBC, Hemoglobins running in the 8 range 9. Hypertension/atrial fibrillation. Amiodarone 200 mg twice a day, Aldactone 25 mg daily. Monitor with increased mobility 10. BPH. Flomax 0.4 mg daily. Check PVR 3 11. Decreased nutritional storage. Follow-up dietary consult, enc protein intake, trial beneprotein, Appreciate dietary f/u 12. Memory deficits, per wife this  was not a pre-existing problem.+encephalopathy 13.  Freq urination-  oxybutnin low dose slept better LOS (Days) 6 A FACE TO FACE EVALUATION WAS PERFORMED  KIRSTEINS,ANDREW E 09/25/2015, 9:19 AM

## 2015-09-26 ENCOUNTER — Inpatient Hospital Stay (HOSPITAL_COMMUNITY): Payer: Medicare Other | Admitting: Physical Therapy

## 2015-09-26 ENCOUNTER — Inpatient Hospital Stay (HOSPITAL_COMMUNITY): Payer: Medicare Other | Admitting: Occupational Therapy

## 2015-09-26 ENCOUNTER — Inpatient Hospital Stay (HOSPITAL_COMMUNITY): Payer: Medicare Other | Admitting: Speech Pathology

## 2015-09-26 ENCOUNTER — Inpatient Hospital Stay (HOSPITAL_COMMUNITY): Payer: Medicare Other

## 2015-09-26 DIAGNOSIS — G931 Anoxic brain damage, not elsewhere classified: Secondary | ICD-10-CM

## 2015-09-26 DIAGNOSIS — I6782 Cerebral ischemia: Secondary | ICD-10-CM

## 2015-09-26 LAB — LACTATE DEHYDROGENASE: LDH: 181 U/L (ref 98–192)

## 2015-09-26 LAB — BASIC METABOLIC PANEL
Anion gap: 6 (ref 5–15)
BUN: 12 mg/dL (ref 6–20)
CO2: 25 mmol/L (ref 22–32)
Calcium: 8.7 mg/dL — ABNORMAL LOW (ref 8.9–10.3)
Chloride: 108 mmol/L (ref 101–111)
Creatinine, Ser: 1 mg/dL (ref 0.61–1.24)
GFR calc Af Amer: >60 mL/min (ref >60)
GFR calc non Af Amer: >60 mL/min (ref >60)
Glucose, Bld: 143 mg/dL — ABNORMAL HIGH (ref 65–99)
Potassium: 3.9 mmol/L (ref 3.5–5.1)
Sodium: 139 mmol/L (ref 135–145)

## 2015-09-26 LAB — CBC
HCT: 28.7 % — ABNORMAL LOW (ref 39.0–52.0)
Hemoglobin: 8.3 g/dL — ABNORMAL LOW (ref 13.0–17.0)
MCH: 24.9 pg — AB (ref 26.0–34.0)
MCHC: 28.9 g/dL — AB (ref 30.0–36.0)
MCV: 86.2 fL (ref 78.0–100.0)
PLATELETS: 135 10*3/uL — AB (ref 150–400)
RBC: 3.33 MIL/uL — AB (ref 4.22–5.81)
RDW: 17.4 % — AB (ref 11.5–15.5)
WBC: 3.5 10*3/uL — ABNORMAL LOW (ref 4.0–10.5)

## 2015-09-26 LAB — MAGNESIUM: Magnesium: 2.3 mg/dL (ref 1.7–2.4)

## 2015-09-26 LAB — PROTIME-INR
INR: 2.1 — AB (ref 0.00–1.49)
PROTHROMBIN TIME: 23.4 s — AB (ref 11.6–15.2)

## 2015-09-26 MED ORDER — OXYBUTYNIN CHLORIDE 5 MG PO TABS
5.0000 mg | ORAL_TABLET | Freq: Every day | ORAL | Status: DC
  Administered 2015-09-26 – 2015-10-01 (×6): 5 mg via ORAL
  Filled 2015-09-26 (×7): qty 1

## 2015-09-26 NOTE — Progress Notes (Signed)
ANTICOAGULATION CONSULT NOTE - Follow Up Consult  Pharmacy Consult for Coumadin Indication: LVAD  Allergies  Allergen Reactions  . Phenergan [Promethazine Hcl] Nausea And Vomiting  . Lasix [Furosemide] Other (See Comments)    "Dropped blood pressure too low"  . Morphine And Related Other (See Comments)    Goes crazy     Patient Measurements: Weight: 165 lb 3.2 oz (74.934 kg) (one pillow, sheet and blanket)  Vital Signs: Temp: 99.1 F (37.3 C) (07/20 0517) Temp Source: Oral (07/20 0517) Pulse Rate: 60 (07/20 0517)  Labs:  Recent Labs  09/24/15 0445 09/25/15 0445 09/26/15 0420  HGB  --  8.4* 8.3*  HCT  --  27.6* 28.7*  PLT  --  144* 135*  LABPROT 21.9* 22.6* 23.4*  INR 1.92* 2.00* 2.10*  CREATININE 0.82 0.90  --     Estimated Creatinine Clearance: 77.4 mL/min (by C-G formula based on Cr of 0.9).  Assessment: 74yom s/p LVAD 6/23 continues on coumadin (started low dose per MD on 6/24, pharmacy dosing starting 7/3). INR is therapeutic today at 2.1 He continues on po amiodarone. CBC stable. No bleeding.  Goal of Therapy:  INR 2-2.5 Monitor platelets by anticoagulation protocol: yes   Plan:  1) Continue coumadin 3mg  daily 2) Daily INR  Louie Casa, PharmD, BCPS 09/26/2015 11:30 AM

## 2015-09-26 NOTE — Progress Notes (Signed)
Physical Therapy Session Note  Patient Details  Name: [name] MRN: [id number] Date of Birth: Apr 08, 1940  Today's Date: 09/26/2015 PT Individual Time: 1125-1200 and 1400-1430 PT Individual Time Calculation (min): 35 min and 30 min (total 65 min)   Short Term Goals: Week 1:  PT Short Term Goal 1 (Week 1): Pt will perform bed mobility on flat bed with min A while maintaining sternal precautions PT Short Term Goal 2 (Week 1): Pt will perform sit <> stand and stand pivot transfers with min A from various heights of surfaces while maintaining sternal precautions PT Short Term Goal 3 (Week 1): Pt will perform gait x 100' with RW and min A  PT Short Term Goal 4 (Week 1): Pt will negotiate 4 stairs (6 inches) with 2 rails with min A   Skilled Therapeutic Interventions/Progress Updates:   Tx 1: Pt initially seen in room resting in recliner; RN and LVAD RN present to assess vitals and provide education therefore pt missed 25 min PT. Gait to gym with min guard; increase in staggering and poor coordination of B feet. Max cues for navigation to locate gym. Sit <>stand 2x5 reps with no UE support; close S overall and pt frequently requires several attempts to stand prior to completing full stand d/t decreased power production BLEs. Pt reports increase in L knee pain with sit <>stands; educated pt on importance of LE strengthening to support knee joint and reduce pain. Nustep x8 min total with BLE for strengthening and aerobic endurance. Gait to return to room with min guard. Remained seated in recliner with wife present, all needs in reach.   Tx 2: Pt received seated in recliner, denies pain and agreeable to treatment. Gait to/from gym 2x160' with RW and S; min cues for RW management when turning corners with pt moving feet outside of RW. Monster walks in parallel bars with light orange theraband around BLEs for hip abductor strengthening for carryover into balance; mod cues for attention to task,  carryover of task from one trial to another. 2 sets 15 reps heel raises with light UE support on bars. Returned to room with RW and S. Sit >supine in bed with S and increased time. Remained supine in bed with alarm intact and all needs in reach at completion of session.   Therapy Documentation Precautions:  Precautions Precautions: Sternal Precaution Comments: LVAD, Call VAD Pager [phone]. Flow less than 2.5, or sustained increase or decrease of 2.0 L Restrictions Weight Bearing Restrictions: No RUE Weight Bearing: Non weight bearing LUE Weight Bearing: Non weight bearing General: PT Amount of Missed Time (min): 25 Minutes PT Missed Treatment Reason: Nursing care (LVAD nurse in for assessment)   See Function Navigator for Current Functional Status.   Therapy/Group: Individual Therapy  [name] 09/26/2015, 12:26 PM

## 2015-09-26 NOTE — Progress Notes (Signed)
Patient ID: EURA RADABAUGH, male   DOB: 06/15/1940, 75 y.o.   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 Normal cors. EF 10% with low cardiac output.   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   Foley placed 7/3 for urinary retention. S/P CT with 1700 cc fluid out.   Ramp echo 7/10 with increase in speed to 9400 rpm. Speed decreased to 9200 09/24/15. PI events went from 22 > 7 > 2  Continues to improve. Feels better with speed decreased. Appetite improving per family. Tentative discharge 10/02/15.  Weight stable over night.   Labs pending for today.    LVAD INTERROGATION:  HeartMate II LVAD:  Flow 4.8 liters/min, speed 9200, power 5.4, PI 5.4  Objective:    Vital Signs:   Temp:  [97.6 F (36.4 C)-99.1 F (37.3 C)] 99.1 F (37.3 C) (07/20 0517) Pulse Rate:  [57-60] 60 (07/20 0517) Resp:  [16-17] 16 (07/20 0517) SpO2:  [97 %] 97 % (07/20 0517) Weight:  [165 lb 3.2 oz (74.934 kg)] 165 lb 3.2 oz (74.934 kg) (07/20 0517) Last BM Date: 09/25/15 Mean arterial Pressure 88  Intake/Output:   Intake/Output Summary (Last 24 hours) at 09/26/15 1028 Last data filed at 09/26/15 0803  Gross per 24 hour  Intake     20 ml  Output   1400 ml  Net  -1380 ml     Physical Exam: General: In the chair. NAD.  HEENT: normal  Neck: supple. JVP stable ~6-7 cm Cor: LVAD hum.  Lungs: CTAB, normal effort Abdomen: soft, non-tender, non-distended, no HSM. No bruits or masses. +BS Driveline site ok. Securement device in place. Extremities: no cyanosis, clubbing, rash.  1+ ankle edema.  Neuro: Alert and oriented to person and place  Labs: Basic Metabolic Panel:  Recent Labs Lab 09/21/15 0505 09/22/15 0417 09/23/15 0512 09/24/15 0445 09/25/15 0445  NA 141 139 139 141 140  K 3.9 3.8 4.0 3.9 4.0  CL 104 108 107 108 106  CO2 30 28 28 28 28   GLUCOSE 90 94 94 87 87  BUN 10 10 10 10 12    CREATININE 0.88 0.91 0.90 0.82 0.90  CALCIUM 8.9 8.7* 8.5* 8.7* 8.6*    Liver Function Tests:  Recent Labs Lab 09/20/15 0810  AST 19  ALT 22  ALKPHOS 67  BILITOT 0.8  PROT 5.5*  ALBUMIN 2.5*   No results for input(s): LIPASE, AMYLASE in the last 168 hours. No results for input(s): AMMONIA in the last 168 hours.  CBC:  Recent Labs Lab 09/20/15 0810 09/22/15 0417 09/25/15 0445 09/26/15 0420  WBC 5.0 3.9* 3.7* 3.5*  NEUTROABS 3.4  --   --   --   HGB 9.0* 8.3* 8.4* 8.3*  HCT 29.6* 27.1* 27.6* 28.7*  MCV 86.0 85.5 85.2 86.2  PLT 182 158 144* 135*    INR:  Recent Labs Lab 09/22/15 0417 09/23/15 0512 09/24/15 0445 09/25/15 0445 09/26/15 0420  INR 2.95* 2.45* 1.92* 2.00* 2.10*    Other results:  EKG:   Imaging: Dg Chest 2 View  09/26/2015  CLINICAL DATA:  Placement of left ventricular in cyst device on August 30, 2015, chest soreness. EXAM: CHEST  2 VIEW COMPARISON:  PA and lateral chest x-ray of September 18, 2015 FINDINGS: The lungs are mildly hypoinflated. The lung markings are coarse in the right infrahilar region but are relatively stable. There  is no pleural effusion or pneumothorax. The cardiac silhouette is enlarged. The left ventricular assist device appears to be in stable position. The permanent pacemaker defibrillator also appears to be stable. The sternal wires are intact. IMPRESSION: Mild hypoinflation. No definite acute cardiopulmonary abnormality. The left ventricular assist device and the implantable pacemaker defibrillator appear to be in stable position. Electronically Signed   By: David  [place] M.D.   On: 09/26/2015 07:54     Medications:     Scheduled Medications: . amiodarone  200 mg Oral BID  . aspirin EC  81 mg Oral Daily  . bisacodyl  10 mg Oral Daily   Or  . bisacodyl  10 mg Rectal Daily  . docusate sodium  100 mg Oral BID  . feeding supplement  1 Container Oral Q1500  . feeding supplement (ENSURE ENLIVE)  237 mL Oral BID BM  . ferrous  fumarate-b12-vitamic C-folic acid  1 capsule Oral TID PC  . mirtazapine  30 mg Oral QHS  . oxybutynin  5 mg Oral QHS  . pantoprazole  40 mg Oral Daily  . protein supplement  1 scoop Oral TID WC  . sodium chloride flush  10-40 mL Intracatheter Q12H  . tamsulosin  0.4 mg Oral Daily  . warfarin  3 mg Oral q1800  . Warfarin - Pharmacist Dosing Inpatient   Does not apply q1800    Infusions:    PRN Medications: acetaminophen, ondansetron **OR** ondansetron (ZOFRAN) IV, polyvinyl alcohol, sodium chloride flush, sorbitol, traMADol   Assessment:   1. 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 EF 15%. Cath 8/14 with normal cors. 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  2. Frequent PVCs 3. Severe malnutrition- Prealbumin 14.7 on 6/20 4. Acute post-op delirium/sundowning 5. Urinary Retention requiring Foley 6. Left pleural effusion s/p chest tube  Plan/Discussion:    Continues to work with PT. Appetite improving. With oral intake improving will likely add spiro back in next day or two.  May initially be sufficient diuresis for him.  No changes today  Pharmacy dosing coumadin, goal INR 2-2.5.  INR 2.1 today.  MAP stable.   Pt still has PICC line in for blood work. Will consider removing in next day or two.   Isolated power spike noted (10.4) week of 09/18/15. Event log was reviewed thought to be pump burn in.  LDH stable at 186 today.   Appreciate CIR and input. Continue therapy. We will follow along with recommendations.   I reviewed the LVAD parameters from today, and compared the results to the patient's prior recorded data.  No programming changes were made.  The LVAD is functioning within specified parameters.  The patient performs LVAD self-test daily.  LVAD interrogation was negative for any significant power changes, alarms or PI events/speed drops.  LVAD equipment check completed and is in good working order.  Back-up equipment present.   LVAD education done  on emergency procedures and precautions and reviewed exit site care.  Length of Stay: [address]  [employer] 09/26/2015, 10:28 AM  VAD Team --- VAD ISSUES ONLY--- Pager [phone] (7am - 7am)  Advanced Heart Failure Team  Pager [phone] (M-F; 7a - 4p)  Please contact [facility] for night-coverage after hours (4p -7a ) and weekends on [email or web address]  Patient seen with PA, agree with the above note.  Orthostatics done today, PI dropped to 2.1 from 5.6 with standing and MAP fell.  Flow stable and he was not particularly symptomatic.  HR was flat.  - Needs to wear ted hose.  - Off Lasix/spironolactone.  - Push po intake.  - Device interrogated, back-up atrial rate set to 60 bpm with activity response on.  Hopefully this will help with his HR response.   VF therapies restarted (set for 200 bpm).   Marca Ancona 09/26/2015 1:37 PM

## 2015-09-26 NOTE — Progress Notes (Signed)
Upon entering room this AM, wife reports "the same alarm as yesterday has gone off twice now, but I didn't call since the light is still green."  RN notes no alarms at present; controller and wall unit showing no alarms, patient asymptomatic and sleeping but easily aroused for morning vitals. Will continue to monitor.

## 2015-09-26 NOTE — Plan of Care (Signed)
Problem: RH SKIN INTEGRITY Goal: RH STG ABLE TO PERFORM INCISION/WOUND CARE W/ASSISTANCE STG Able To Perform Incision/Wound Care With total Assistance from caregiver Outcome: Progressing Wife performed LVAD driveline dressing change with supervision of RN, minimal cues needed.

## 2015-09-26 NOTE — Progress Notes (Signed)
Speech Language Pathology Daily Session Note  Patient Details  Name: [name] MRN: [id number] Date of Birth: 1940/10/22  Today's Date: 09/26/2015 SLP Individual Time: 0930-1000 SLP Individual Time Calculation (min): 30 min  Short Term Goals: Week 1: SLP Short Term Goal 1 (Week 1): Pt will sustain his attention to a basic, familiar tasks for 3 minutes with min verbal cues for redirection.  SLP Short Term Goal 2 (Week 1): Pt will complete a basic, familiar task with min assist verbal cues for functional problem solving.   SLP Short Term Goal 3 (Week 1): Pt will utilize external aids to orient to date and situation with min assist verbal cues.   SLP Short Term Goal 4 (Week 1): Pt will return demonstration of how to connect and disconnect from battery power of LVAD with mod assist verbal and tactile cues.   Skilled Therapeutic Interventions: Skilled treatment session focused on cognitive goals. Patient independently was oriented to place, month and year and required Mod A verbal cues for orientation to date and situation. SLP facilitated session by initiating external aids to maximize recall and carryover of information. Patient independently utilized external aids for orientation to place and situation but required total A for use of external aids for orientation to date. Patient's wife present throughout session. Patient left upright in recliner with wife present. Continue with current plan of care.    Function:  Cognition Comprehension Comprehension assist level: Understands basic 90% of the time/cues < 10% of the time  Expression   Expression assist level: Expresses basic 90% of the time/requires cueing < 10% of the time.  Social Interaction Social Interaction assist level: Interacts appropriately 75 - 89% of the time - Needs redirection for appropriate language or to initiate interaction.  Problem Solving Problem solving assist level: Solves basic 75 - 89% of the time/requires  cueing 10 - 24% of the time  Memory Memory assist level: Recognizes or recalls 25 - 49% of the time/requires cueing 50 - 75% of the time    Pain No/Denies Pain   Therapy/Group: Individual Therapy  Lecil Tapp 09/26/2015, 11:08 AM

## 2015-09-26 NOTE — Progress Notes (Signed)
Subjective/Complaints: Was up voiding 3- times last noc per wife   ROS- no CP, SOB, N/V/D  Objective: Vital Signs: Blood pressure 74/55, pulse 60, temperature 99.1 F (37.3 C), temperature source Oral, resp. rate 16, weight 74.934 kg (165 lb 3.2 oz), SpO2 97 %. Dg Chest 2 View  09/26/2015  CLINICAL DATA:  Placement of left ventricular in cyst device on August 30, 2015, chest soreness. EXAM: CHEST  2 VIEW COMPARISON:  PA and lateral chest x-ray of September 18, 2015 FINDINGS: The lungs are mildly hypoinflated. The lung markings are coarse in the right infrahilar region but are relatively stable. There is no pleural effusion or pneumothorax. The cardiac silhouette is enlarged. The left ventricular assist device appears to be in stable position. The permanent pacemaker defibrillator also appears to be stable. The sternal wires are intact. IMPRESSION: Mild hypoinflation. No definite acute cardiopulmonary abnormality. The left ventricular assist device and the implantable pacemaker defibrillator appear to be in stable position. Electronically Signed   By: David  [place] M.D.   On: 09/26/2015 07:54   Results for orders placed or performed during the hospital encounter of 09/19/15 (from the past 72 hour(s))  Protime-INR     Status: Abnormal   Collection Time: 09/24/15  4:45 AM  Result Value Ref Range   Prothrombin Time 21.9 (H) 11.6 - 15.2 seconds   INR 1.92 (H) 0.00 - 1.49  Lactate dehydrogenase     Status: None   Collection Time: 09/24/15  4:45 AM  Result Value Ref Range   LDH 186 98 - 192 U/L  Basic metabolic panel     Status: Abnormal   Collection Time: 09/24/15  4:45 AM  Result Value Ref Range   Sodium 141 135 - 145 mmol/L   Potassium 3.9 3.5 - 5.1 mmol/L   Chloride 108 101 - 111 mmol/L   CO2 28 22 - 32 mmol/L   Glucose, Bld 87 65 - 99 mg/dL   BUN 10 6 - 20 mg/dL   Creatinine, Ser 3.35 0.61 - 1.24 mg/dL   Calcium 8.7 (L) 8.9 - 10.3 mg/dL   GFR calc non Af Amer >60 >60 mL/min   GFR calc Af  Amer >60 >60 mL/min    Comment: (NOTE) The eGFR has been calculated using the CKD EPI equation. This calculation has not been validated in all clinical situations. eGFR's persistently <60 mL/min signify possible Chronic Kidney Disease.    Anion gap 5 5 - 15  Protime-INR     Status: Abnormal   Collection Time: 09/25/15  4:45 AM  Result Value Ref Range   Prothrombin Time 22.6 (H) 11.6 - 15.2 seconds   INR 2.00 (H) 0.00 - 1.49  Lactate dehydrogenase     Status: None   Collection Time: 09/25/15  4:45 AM  Result Value Ref Range   LDH 182 98 - 192 U/L  Basic metabolic panel     Status: Abnormal   Collection Time: 09/25/15  4:45 AM  Result Value Ref Range   Sodium 140 135 - 145 mmol/L   Potassium 4.0 3.5 - 5.1 mmol/L   Chloride 106 101 - 111 mmol/L   CO2 28 22 - 32 mmol/L   Glucose, Bld 87 65 - 99 mg/dL   BUN 12 6 - 20 mg/dL   Creatinine, Ser 4.56 0.61 - 1.24 mg/dL   Calcium 8.6 (L) 8.9 - 10.3 mg/dL   GFR calc non Af Amer >60 >60 mL/min   GFR calc Af Amer >60 >60 mL/min  Comment: (NOTE) The eGFR has been calculated using the CKD EPI equation. This calculation has not been validated in all clinical situations. eGFR's persistently <60 mL/min signify possible Chronic Kidney Disease.    Anion gap 6 5 - 15  CBC     Status: Abnormal   Collection Time: 09/25/15  4:45 AM  Result Value Ref Range   WBC 3.7 (L) 4.0 - 10.5 K/uL   RBC 3.24 (L) 4.22 - 5.81 MIL/uL   Hemoglobin 8.4 (L) 13.0 - 17.0 g/dL   HCT 11.1 (L) 35.6 - 52.7 %   MCV 85.2 78.0 - 100.0 fL   MCH 25.9 (L) 26.0 - 34.0 pg   MCHC 30.4 30.0 - 36.0 g/dL   RDW 80.2 (H) 44.3 - 29.8 %   Platelets 144 (L) 150 - 400 K/uL  Urinalysis, Routine w reflex microscopic (not at [facility])     Status: None   Collection Time: 09/25/15 11:34 PM  Result Value Ref Range   Color, Urine YELLOW YELLOW   APPearance CLEAR CLEAR   Specific Gravity, Urine 1.008 1.005 - 1.030   pH 7.0 5.0 - 8.0   Glucose, UA NEGATIVE NEGATIVE mg/dL   Hgb urine  dipstick NEGATIVE NEGATIVE   Bilirubin Urine NEGATIVE NEGATIVE   Ketones, ur NEGATIVE NEGATIVE mg/dL   Protein, ur NEGATIVE NEGATIVE mg/dL   Nitrite NEGATIVE NEGATIVE   Leukocytes, UA NEGATIVE NEGATIVE    Comment: MICROSCOPIC NOT DONE ON URINES WITH NEGATIVE PROTEIN, BLOOD, LEUKOCYTES, NITRITE, OR GLUCOSE <1000 mg/dL.  Protime-INR     Status: Abnormal   Collection Time: 09/26/15  4:20 AM  Result Value Ref Range   Prothrombin Time 23.4 (H) 11.6 - 15.2 seconds   INR 2.10 (H) 0.00 - 1.49  Lactate dehydrogenase     Status: None   Collection Time: 09/26/15  4:20 AM  Result Value Ref Range   LDH 181 98 - 192 U/L  CBC     Status: Abnormal   Collection Time: 09/26/15  4:20 AM  Result Value Ref Range   WBC 3.5 (L) 4.0 - 10.5 K/uL   RBC 3.33 (L) 4.22 - 5.81 MIL/uL   Hemoglobin 8.3 (L) 13.0 - 17.0 g/dL   HCT 51.1 (L) 00.8 - 38.7 %   MCV 86.2 78.0 - 100.0 fL   MCH 24.9 (L) 26.0 - 34.0 pg   MCHC 28.9 (L) 30.0 - 36.0 g/dL   RDW 82.8 (H) 07.6 - 66.6 %   Platelets 135 (L) 150 - 400 K/uL     HEENT: normal Cardio: LVAD hum Resp: CTA B/L and unlabored GI: NT, ND Extremity:  Pulses positive and No Edema Skin:   Breakdown sacral gr 2 Neuro: Confused and Abnormal Motor 4/5 in BUE and BLE Musc/Skel:  Other no pain with UE or LE ROM Gen NAD   Assessment/Plan: 1. Functional deficits secondary to deconditioning and encephalopathy which require 3+ hours per day of interdisciplinary therapy in a comprehensive inpatient rehab setting. Physiatrist is providing close team supervision and 24 hour management of active medical problems listed below. Physiatrist and rehab team continue to assess barriers to discharge/monitor patient progress toward functional and medical goals. FIM: Function - Bathing Position: Wheelchair/chair at sink Body parts bathed by patient: Right arm, Left arm, Chest, Front perineal area, Right upper leg, Left upper leg, Abdomen, Buttocks Body parts bathed by helper:  Back Bathing not applicable: Left lower leg, Right lower leg Assist Level: Touching or steadying assistance(Pt > 75%)  Function- Upper Body Dressing/Undressing What  is the patient wearing?: Pull over shirt/dress Pull over shirt/dress - Perfomed by patient: Thread/unthread right sleeve, Thread/unthread left sleeve, Pull shirt over trunk, Put head through opening Pull over shirt/dress - Perfomed by helper: Put head through opening Assist Level: Supervision or verbal cues Set up : To obtain clothing/put away Function - Lower Body Dressing/Undressing What is the patient wearing?: Underwear, Pants, Maryln Manuel, Shoes Position: Education officer, museum at [employer] - Performed by patient: Thread/unthread right underwear leg, Pull underwear up/down Underwear - Performed by helper: Thread/unthread left underwear leg Pants- Performed by patient: Thread/unthread left pants leg, Pull pants up/down Pants- Performed by helper: Thread/unthread right pants leg Non-skid slipper socks- Performed by patient: Don/doff right sock, Don/doff left sock Non-skid slipper socks- Performed by helper: Don/doff right sock, Don/doff left sock Shoes - Performed by patient: Don/doff right shoe, Don/doff left shoe TED Hose - Performed by helper: Don/doff right TED hose, Don/doff left TED hose Assist for footwear: Partial/moderate assist Assist for lower body dressing: Touching or steadying assistance (Pt > 75%) (Mod assist sit > stand)  Function - Toileting Toileting steps completed by patient: Adjust clothing after toileting Toileting steps completed by helper: Adjust clothing prior to toileting, Performs perineal hygiene Toileting Assistive Devices: Grab bar or rail Assist level: Touching or steadying assistance (Pt.75%)  Function - Air cabin crew transfer assistive device: Grab bar Assist level to toilet: Touching or steadying assistance (Pt > 75%) Assist level from toilet: Touching or steadying assistance (Pt  > 75%) Assist level to bedside commode (at bedside): Moderate assist (Pt 50 - 74%/lift or lower) Assist level from bedside commode (at bedside): Moderate assist (Pt 50 - 74%/lift or lower)  Function - Chair/bed transfer Chair/bed transfer method: Ambulatory Chair/bed transfer assist level: Touching or steadying assistance (Pt > 75%) Chair/bed transfer assistive device: Walker Chair/bed transfer details: Verbal cues for technique, Verbal cues for precautions/safety  Function - Locomotion: Wheelchair Type: Manual (bil LE propulsion) Wheelchair activity did not occur: Safety/medical concerns (sternal precautions) Max wheelchair distance: 150 Assist Level: Supervision or verbal cues Wheel 50 feet with 2 turns activity did not occur: Safety/medical concerns Assist Level: Supervision or verbal cues Wheel 150 feet activity did not occur: Safety/medical concerns Assist Level: Supervision or verbal cues Turns around,maneuvers to table,bed, and toilet,negotiates 3% grade,maneuvers on rugs and over doorsills: No Function - Locomotion: Ambulation Assistive device: No device Max distance: 175 Assist level: Touching or steadying assistance (Pt > 75%) Assist level: Touching or steadying assistance (Pt > 75%) Walk 50 feet with 2 turns activity did not occur: Safety/medical concerns Assist level: Touching or steadying assistance (Pt > 75%) Walk 150 feet activity did not occur: Safety/medical concerns Assist level: Touching or steadying assistance (Pt > 75%) Walk 10 feet on uneven surfaces activity did not occur: Safety/medical concerns  Function - Comprehension Comprehension: Auditory Comprehension assist level: Understands basic 90% of the time/cues < 10% of the time  Function - Expression Expression: Verbal Expression assist level: Expresses basic 90% of the time/requires cueing < 10% of the time.  Function - Social Interaction Social Interaction assist level: Interacts appropriately 75 -  89% of the time - Needs redirection for appropriate language or to initiate interaction.  Function - Problem Solving Problem solving assist level: Solves basic 75 - 89% of the time/requires cueing 10 - 24% of the time  Function - Memory Memory assist level: Recognizes or recalls 25 - 49% of the time/requires cueing 50 - 75% of the time Patient normally able to recall (first  3 days only): Current season, That he or she is in a hospital  1. Debilitation secondary to acute on chronic systolic congestive heart failure/recent LVAD 09/31/1216 complicated by left pleural effusion with chest tube Continue CIR PT, OT, SLP Planned d/c 7/26 for supervision level.  Anticipate need for 24/7 sup due to cognitive issues that are expected to persist for week or months, discussed with pt and wife, also discussed no driving 2. DVT Prophylaxis/Anticoagulation: Chronic Coumadin therapy. Monitor for any bleeding episodes 3. Pain Management: Ultram as needed 4. Mood: Remeron increase to 30 mg daily at bedtime, Zyprexa 5 mg daily at bedtime, d/c celexa 5. Neuropsych: This patient is capable of making decisions on his own behalf. 6. Skin/Wound Care: Routine skin checks 7. Fluids/Electrolytes/Nutrition: Routine I&O's with follow-up chemistries 8. Acute blood loss anemia. Follow-up CBC, Hemoglobins running in the 8 range 9. Hypertension/atrial fibrillation. Amiodarone 200 mg twice a day, Aldactone 25 mg daily. Monitor with increased mobility 10. BPH. Flomax 0.4 mg daily. Check PVR 3 11. Decreased nutritional storage. Follow-up dietary consult, enc protein intake, trial beneprotein, Appreciate dietary f/u 12. Memory deficits, per wife this was not a pre-existing problem.+encephalopathy 13.  Freq urination-  oxybutnin low dose, still up 3 times, Neg UA, increase dose LOS (Days) 7 A FACE TO FACE EVALUATION WAS PERFORMED  KIRSTEINS,ANDREW E 09/26/2015, 8:03 AM

## 2015-09-26 NOTE — Progress Notes (Signed)
Speech Language Pathology Weekly Progress and Session Note  Patient Details  Name: [name] MRN: [id number] Date of Birth: 1940-04-02  Beginning of progress report period: September 19, 2015   End of progress report period: September 27, 2015   Today's Date: 09/26/2015 SLP Individual Time: 4010-2725 SLP Individual Time Calculation (min): 30 min  Short Term Goals: Week 1: SLP Short Term Goal 1 (Week 1): Pt will sustain his attention to a basic, familiar tasks for 3 minutes with min verbal cues for redirection.  SLP Short Term Goal 1 - Progress (Week 1): Met SLP Short Term Goal 2 (Week 1): Pt will complete a basic, familiar task with min assist verbal cues for functional problem solving.   SLP Short Term Goal 2 - Progress (Week 1): Progressing toward goal SLP Short Term Goal 3 (Week 1): Pt will utilize external aids to orient to date and situation with min assist verbal cues.   SLP Short Term Goal 3 - Progress (Week 1): Met SLP Short Term Goal 4 (Week 1): Pt will return demonstration of how to connect and disconnect from battery power of LVAD with mod assist verbal and tactile cues.  SLP Short Term Goal 4 - Progress (Week 1): Met    New Short Term Goals: Week 2: SLP Short Term Goal 1 (Week 2): Pt will sustain his attention to a basic, familiar task for 10 minutes with min verbal cues for redirection.  SLP Short Term Goal 2 (Week 2): Pt will complete a basic, familiar task with min assist verbal cues for functional problem solving.   SLP Short Term Goal 3 (Week 2): Pt will utilize external aids to orient to date and situation with supervision verbal cues.   SLP Short Term Goal 4 (Week 2): Pt will return demonstration of how to connect and disconnect from battery power of LVAD with min assist verbal and tactile cues.   Weekly Progress Updates:  Pt has made excellent gains this reporting period and has met 3 out of 4 short term goals.  Pt is currently min-mod assist during basic, familiar  tasks due to moderate cognitive impairment.  Pt has demonstrated improved use of external aids to facilitate recall of orientation information, improved sustained attention to tasks, and improved awareness of his deficits and their impact on his independence in the home environment.  Pt would continue to benefit from skilled ST while inpatient in order to maximize functional independence and reduce burden of care prior to discharge home.  Pt and family education has been ongoing with multiple family members.  Continue to recommend 24/7 supervision upon discharge in addition to [place] follow up at next level of care.     Intensity: Minumum of 1-2 x/day, 30 to 90 minutes Frequency: 3 to 5 out of 7 days Duration/Length of Stay: 10-12 days  Treatment/Interventions: Cognitive remediation/compensation;Cueing hierarchy;Environmental controls;Functional tasks;Internal/external aids;Patient/family education   Daily Session  Skilled Therapeutic Interventions: Pt was seen for skilled ST targeting cognitive goals.  Pt utilized external aids to facilitate recall of orientation information with min assist verbal cues.  Min assist verbal cues were also needed to reorient to the components of pt's LVAD.  Pt was able to recall his discharge date with supervision question cues and calculate how many days were remaining for his length of stay with use of calendar.  Pt utilized sternal precautions when standing from recliner and ambulating to bathroom with supervision question cues.  Pt was left on commode with wife present and nurse  tech made aware at the end of today's therapy session.  Goals were updated on this date to reflect current progress and plan of care.        Function:   Eating Eating                 Cognition Comprehension Comprehension assist level: Understands basic 90% of the time/cues < 10% of the time  Expression   Expression assist level: Expresses basic 90% of the time/requires cueing < 10%  of the time.  Social Interaction Social Interaction assist level: Interacts appropriately 75 - 89% of the time - Needs redirection for appropriate language or to initiate interaction.  Problem Solving Problem solving assist level: Solves basic 75 - 89% of the time/requires cueing 10 - 24% of the time  Memory Memory assist level: Recognizes or recalls 50 - 74% of the time/requires cueing 25 - 49% of the time   General    Pain Pain Assessment Pain Assessment: No/denies pain  Therapy/Group: Individual Therapy  Alexah Kivett, Selinda Orion 09/26/2015, 3:58 PM

## 2015-09-26 NOTE — Progress Notes (Signed)
Occupational Therapy Session Note  Patient Details  Name: [name] MRN: [id number] Date of Birth: 11/12/1940  Today's Date: 09/26/2015 OT Individual Time: 0830-0930 OT Individual Time Calculation (min): 60 min    Short Term Goals: Week 1:  OT Short Term Goal 1 (Week 1): Pt will complete UB dressing with supervision while adhering to sternal precautions with min cues OT Short Term Goal 2 (Week 1): Pt will complete toilet transfers with min assist while adhering to sternal precautions with min cues OT Short Term Goal 3 (Week 1): Pt will complete LB dressing with min assist at sit > stand level while adhering to sternal precautions OT Short Term Goal 4 (Week 1): Pt will complete bathing with min assist at sit > stand levle while adhering to sternal precautions OT Short Term Goal 5 (Week 1): Pt will tolerate standing for 4 mins while completing grooming tasks with supervision  Skilled Therapeutic Interventions/Progress Updates:    1:1 self care retraining at sink level (due to LVAD). Pt already on his batteries so bathed at sink level in chair (with w/c cushion for increased height). Pt required max to total cues for basic problem solve of routine self care tasks. Pt with difficulty sequencing bathing and dressing tasks; forgeting where we were in process asking to do something else requiring A to complete task. PT with difficulty orienting clothing (short, underwear and pants) to don and then difficulty coordinating how to don them; requiring max to total cuing. PT with decr awareness reporting "dressing would be easier in standing. Pt allowed to trial donning pants in standing with min A for balance but still with difficulty with donning but decr awareness of why. PT with decr working memory of picking out clothing at begining of session. Pt require mod cuing throughout session for proper hand placement to maintain sternal prec during sit to stand. Min A for sit to stands today with tactile  cues to maintain a forward weight shift. Left in recliner with SLP at end of session  Therapy Documentation Precautions:  Precautions Precautions: Sternal Precaution Comments: LVAD, Call VAD Pager [phone]. Flow less than 2.5, or sustained increase or decrease of 2.0 L Restrictions Weight Bearing Restrictions: No RUE Weight Bearing: Non weight bearing LUE Weight Bearing: Non weight bearing Pain:  no c/o pain in session    See Function Navigator for Current Functional Status.   Therapy/Group: Individual Therapy  Roney Mans [facility] 09/26/2015, 9:14 AM

## 2015-09-27 ENCOUNTER — Inpatient Hospital Stay (HOSPITAL_COMMUNITY): Payer: Medicare Other | Admitting: Physical Therapy

## 2015-09-27 ENCOUNTER — Inpatient Hospital Stay (HOSPITAL_COMMUNITY): Payer: Medicare Other | Admitting: Occupational Therapy

## 2015-09-27 ENCOUNTER — Inpatient Hospital Stay (HOSPITAL_COMMUNITY): Payer: Medicare Other | Admitting: Speech Pathology

## 2015-09-27 DIAGNOSIS — K5901 Slow transit constipation: Secondary | ICD-10-CM

## 2015-09-27 LAB — CBC
HEMATOCRIT: 30.1 % — AB (ref 39.0–52.0)
Hemoglobin: 8.8 g/dL — ABNORMAL LOW (ref 13.0–17.0)
MCH: 25.1 pg — ABNORMAL LOW (ref 26.0–34.0)
MCHC: 29.2 g/dL — ABNORMAL LOW (ref 30.0–36.0)
MCV: 85.8 fL (ref 78.0–100.0)
PLATELETS: 141 10*3/uL — AB (ref 150–400)
RBC: 3.51 MIL/uL — AB (ref 4.22–5.81)
RDW: 17.5 % — ABNORMAL HIGH (ref 11.5–15.5)
WBC: 3.9 10*3/uL — AB (ref 4.0–10.5)

## 2015-09-27 LAB — PROTIME-INR
INR: 2.02 — ABNORMAL HIGH (ref 0.00–1.49)
PROTHROMBIN TIME: 22.8 s — AB (ref 11.6–15.2)

## 2015-09-27 LAB — BASIC METABOLIC PANEL
ANION GAP: 5 (ref 5–15)
BUN: 11 mg/dL (ref 6–20)
CO2: 28 mmol/L (ref 22–32)
Calcium: 8.5 mg/dL — ABNORMAL LOW (ref 8.9–10.3)
Chloride: 108 mmol/L (ref 101–111)
Creatinine, Ser: 0.87 mg/dL (ref 0.61–1.24)
GLUCOSE: 94 mg/dL (ref 65–99)
POTASSIUM: 4.4 mmol/L (ref 3.5–5.1)
Sodium: 141 mmol/L (ref 135–145)

## 2015-09-27 LAB — LACTATE DEHYDROGENASE
LDH: 433 U/L — AB (ref 98–192)
LDH: 214 U/L — ABNORMAL HIGH (ref 98–192)

## 2015-09-27 LAB — URINE CULTURE

## 2015-09-27 MED ORDER — DOCUSATE SODIUM 100 MG PO CAPS
200.0000 mg | ORAL_CAPSULE | Freq: Two times a day (BID) | ORAL | Status: DC
  Administered 2015-09-27 – 2015-10-02 (×10): 200 mg via ORAL
  Filled 2015-09-27 (×11): qty 2

## 2015-09-27 MED ORDER — POLYETHYLENE GLYCOL 3350 17 G PO PACK
17.0000 g | PACK | Freq: Every day | ORAL | Status: DC
  Administered 2015-09-27 – 2015-09-30 (×4): 17 g via ORAL
  Filled 2015-09-27 (×5): qty 1

## 2015-09-27 NOTE — Progress Notes (Cosign Needed)
Social Work Patient ID: [name], male   DOB: 23-Mar-1940, 75 y.o.   MRN: [id number]   CSW met with pt and his dtr, [name], to update them on team conference discussion.  Pt's wife had gone home for a bit, so dtr will relay d/c date to her.  Explained to them that we would want for pt to have home health f/u and will arrange this and order any necessary DME.  Therapists will also provide family with education prior to pt's d/c.  CSW will continue to follow and assist as needed.

## 2015-09-27 NOTE — Progress Notes (Signed)
Social Work Patient ID: [name], male   DOB: 07/17/1940, 75 y.o.   MRN: [id number]   Edgar Sloop, LCSW Social Worker Signed  Patient Care Conference 09/27/2015  2:33 PM    Expand All Collapse All   Inpatient RehabilitationTeam Conference and Plan of Care Update Date: 09/25/2015   Time: 11:20 PM     Patient Name: [name]       Medical Record Number: [id number]  Date of Birth: 04-Jan-1941 Sex: Male         Room/Bed: 4W20C/4W20C-01 Payor Info: Payor: MEDICARE / Plan: MEDICARE PART A AND B / Product Type: *No Product type* /    Admitting Diagnosis: LAVD   Admit Date/Time:  09/19/2015  6:19 PM Admission Comments: No comment available   Primary Diagnosis:  <principal problem not specified> Principal Problem: <principal problem not specified>    Patient Active Problem List     Diagnosis  Date Noted   .  Presence of left ventricular assist device (LVAD) (HCC)     .  Chronic systolic CHF (congestive heart failure) (HCC)     .  Physical deconditioning  09/19/2015   .  Hypoxic-ischemic encephalopathy  09/19/2015   .  Debilitated  09/19/2015   .  Other congestive heart failure (HCC)     .  Encephalopathy     .  H/O chest tube placement     .  LBBB (left bundle branch block)     .  Generalized OA     .  Prediabetes     .  Acute delirium     .  LVAD (left ventricular assist device) present (HCC)     .  Insomnia     .  Protein-calorie malnutrition, severe  08/23/2015   .  Palliative care encounter     .  Goals of care, counseling/discussion     .  Acute on chronic systolic (congestive) heart failure (HCC)  08/21/2015   .  Acute on chronic systolic CHF (congestive heart failure) (HCC)  08/21/2015   .  PVC (premature ventricular contraction)  01/04/2015   .  Complete heart block (HCC)  12/07/2012   .  Symptomatic bradycardia  11/28/2012   .  Familial cardiomyopathy (HCC)  10/27/2012   .  Chronic systolic heart failure (HCC)  59/74/1638   .  Secondary  cardiomyopathy (HCC)  10/06/2012   .  Left bundle branch block  10/06/2012   .  Syncope  10/06/2012     Expected Discharge Date: Expected Discharge Date: 10/04/15  Team Members Present: Physician leading conference: Dr. Claudette Laws Social Worker Present: Staci Acosta, LCSW Nurse Present: Carmie End, RN PT Present: Alyson Reedy, PT OT Present: Rosalio Loud, OT SLP Present: Jackalyn Lombard, SLP PPS Coordinator present : Tora Duck, RN, CRRN        Current Status/Progress  Goal  Weekly Team Focus   Medical     cannot manage LVAD, intermittent confusion , perceptual issues during dressing  home with family, [facility] and MD f/u  increase endurance   Bowel/Bladder     Cont x2 with urgency; LBM 7/18  Remain cont x2  Continue toileting patient with appropriate devices    Swallow/Nutrition/ Hydration               ADL's     Mod assist sit > stand d/t sternal precautions, min assist bathing and dressing, mod assist transfers d/t sternal precautions, impaired cognition, awareness, sequencing, orientation with familiar self-care  tasks   supervision overall  sit > stand, recall of sternal precautions and LVAD battery change, activity tolerance, safety, pt/family education    Mobility     minA sit <>stand d/t sternal precautions, S bed mobility, min guard transfers and gait with no AD,   S overall maintaining precautions  LE strengthening for sit <>stand, activity tolerance, dynamic standing balance, pt/family education   Communication               Safety/Cognition/ Behavioral Observations    Mod assist   min assist   orientation, recall of daily information, attention to tasks    Pain     Occasional L rib pain at VAD placement (  < 3 on 0-10 pain scale  Assess and treat pain   Skin     stage II R buttock; driveline daily dressing change, VAD dressing L chest; BLE edema   No new breakdown while on rehab  Continue monitoring skin qshift, encourage patient to stay off back/reposition      Rehab Goals Patient on target to meet rehab goals: Yes Rehab Goals Revised: none *See Care Plan and progress notes for long and short-term goals.    Barriers to Discharge:  impulsivity, distractable     Possible Resolutions to Barriers:   cont rehab, caregiver training     Discharge Planning/Teaching Needs:   Home with wife who can provide supervision only.   To be scheduled for wife prior to d/c.    Team Discussion:    Pt with encephalopathy and deconditioning from severe CHF, now with LVAD.  Therapists are working to increase endurance level.  Pt is continent of bladder, but with urgency per RN.  Wife has been doing some dressing changes and nursing will continue to educate.  Pt needs a good bit of assistance to gt up from lower surfaces.  He needs lots of cues for dressing and will need 24/7 supervision due to being clear at times and not at other times.  Pt is steady assist for mobility and is a little impulsive.  Therapists will see if wife can do steady assist.  ST reports pt has cleared a bit, but will need to do lots of education with family about his cognition and how to properly cue pt.   Revisions to Treatment Plan:    none    Continued Need for Acute Rehabilitation Level of Care: The patient requires daily medical management by a physician with specialized training in physical medicine and rehabilitation for the following conditions: Daily direction of a multidisciplinary physical rehabilitation program to ensure safe treatment while eliciting the highest outcome that is of practical value to the patient.: Yes Daily medical management of patient stability for increased activity during participation in an intensive rehabilitation regime.: Yes Daily analysis of laboratory values and/or radiology reports with any subsequent need for medication adjustment of medical intervention for : Cardiac problems;Mood/behavior problems  [name], [name] 09/27/2015, 2:33 PM

## 2015-09-27 NOTE — Progress Notes (Signed)
Nutrition Follow-up  DOCUMENTATION CODES:   Not applicable  INTERVENTION:  Continue Beneprotein (1 scoop) TID with meals, each scoop provides 25 kcal, and 6 grams of protein.   Continue Ensure Enlive po BID, each supplement provides 350 kcal and 20 grams of protein.  Discontinue Boost Breeze due to poor acceptance.  Encourage adequate PO intake.   NUTRITION DIAGNOSIS:   Malnutrition related to chronic illness as evidenced by percent weight loss, moderate depletions of muscle mass; ongoing  GOAL:   Patient will meet greater than or equal to 90% of their needs; progressing  MONITOR:   PO intake, Supplement acceptance, Weight trends, Labs, I & O's, Skin  REASON FOR ASSESSMENT:   Consult Poor PO  ASSESSMENT:   74 y.o. right handed male with history of cardiomyopathy and left bundle branch block. Presented 08/21/2015 through the heart failure clinic with dizziness and low volume output. 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 Found to be in cardiogenic shock. Workup per cardiology services and cardiothoracic surgery patient underwent LVAD 08/30/2015   Meal completion has been varied from 25-100%, however most intake has been 50-100%. Beneprotein and Ensure has been consumed more varied. Boost Breeze however has been refused most to all times. RD to discontinue the Boost Breeze orders. RD to continue with Ensure and Beneprotein to aid in caloric and protein needs.   Labs and medications reviewed.   Diet Order:  Diet regular Room service appropriate?: Yes; Fluid consistency:: Thin  Skin:  Wound (see comment) (Stage II on coccyx)  Last BM:  7/19  Height:   Ht Readings from Last 1 Encounters:  08/21/15 6\' 2"  (1.88 m)    Weight:   Wt Readings from Last 1 Encounters:  09/27/15 161 lb 9.6 oz (73.3 kg)    Ideal Body Weight:  86.36 kg  BMI:  Body mass index is 20.74 kg/(m^2).  Estimated Nutritional Needs:   Kcal:   2000-2200  Protein:  95-105 grams  Fluid:  Per MD  EDUCATION NEEDS:   No education needs identified at this time  Roslyn Smiling, MS, RD, LDN Pager # [phone] After hours/ weekend pager # [phone]

## 2015-09-27 NOTE — Progress Notes (Signed)
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 Implant hospitalization was from 08/22/15 - 09/19/15.   Vital signs: Temo: 99.1 HR: 60 Doppler MAP: 80 Auto cuff BP:  88/73 (76) O2 Sat: 96% on RA Wt in lbs:  179 > 170 > 168 > 167 > 161 > 157 > 165 > 165   LVAD interrogation reveals:  Speed: 9200 Flow: 4.8 Power: 5.4w PI: 5.4  Fixed speed:  9200 Low speed limit: 8600  Drive Line:  C/D/I. Daily dressing changes using guaze dressing with Aquacel silver strip on exit site being maintained per wife. Dressing supplies at bedside.     Labs:  LDH trend:  196 > 266 > 188 > 190 > 182 > 181  INR trend: 2.12 > 2.47 > 3.0 > 2.95 > 2.45 > 2.0 > 1.0  Hgb:  8.5 > 9.0 > 8.3 > 8.4 > 8.3  Antithrombotic Management:   INR goal: 2.0 - 2.5 ASA 81 mg   Orthostatic V/S:     HR Doppler Auto BP Flow PI Power  Speed Sitting   59 80  88/73 (76) 4.5 5.6 5.1  9200 Standing  60   73/50 (56) 4.4 2.1 5.1  9200     Plan/Recommendations:  1. Pt and nurse report ongoing"VAD" alarms. Description of alarms indicate ICD alarm. Notifed [employer] rep and ask   for full device interrogation. 2. Pt increased PO fluids due to drop in PI with orhtostatic changes 3. Pt c/o increased pain from sacral skin breakdown. Nurse aware, current plan Mepilex dressing during day, barrier     cream at night.  4.  Patient still having problems with switching equipment, unable to place batteries in clips; questionable vision issues. Nurse reports wife is also having problems changing power source. VAD coordinator worked with pt today, will focus on wife and daughters tomorrow.  4. Nurse will attempt to obtain loveseat/recliner to improve wife's comfort.    Hessie Diener, RN VAD Coordinator  Office: [phone] 24/7 Emergency VAD Pager: [phone]

## 2015-09-27 NOTE — Progress Notes (Signed)
ANTICOAGULATION CONSULT NOTE - Follow Up Consult  Pharmacy Consult for Coumadin Indication: LVAD  Allergies  Allergen Reactions  . Phenergan [Promethazine Hcl] Nausea And Vomiting  . Lasix [Furosemide] Other (See Comments)    "Dropped blood pressure too low"  . Morphine And Related Other (See Comments)    Goes crazy     Patient Measurements: Weight: 161 lb 9.6 oz (73.3 kg)  Vital Signs: Temp: 98 F (36.7 C) (07/21 0552) Temp Source: Oral (07/21 0552)  Labs:  Recent Labs  09/25/15 0445 09/26/15 0420 09/26/15 1225 09/27/15 0355  HGB 8.4* 8.3*  --  8.8*  HCT 27.6* 28.7*  --  30.1*  PLT 144* 135*  --  141*  LABPROT 22.6* 23.4*  --  22.8*  INR 2.00* 2.10*  --  2.02*  CREATININE 0.90  --  1.00 0.87    Estimated Creatinine Clearance: 78.4 mL/min (by C-G formula based on Cr of 0.87).  Assessment: 74yom s/p LVAD 6/23 continues on coumadin (started low dose per MD on 6/24, pharmacy dosing starting 7/3). INR is therapeutic today at 2.0. He continues on po amiodarone. CBC stable. No bleeding. LDH up 181 > 433, likely hemolyzed, awaiting repeat.   Goal of Therapy:  INR 2-2.5 Monitor platelets by anticoagulation protocol: yes   Plan:  1) Continue coumadin 3mg  daily 2) Daily INR  Louie Casa, PharmD, BCPS 09/27/2015 10:50 AM

## 2015-09-27 NOTE — Progress Notes (Signed)
Speech Language Pathology Daily Session Note  Patient Details  Name: [name] MRN: [id number] Date of Birth: 1940-11-07  Today's Date: 09/27/2015 SLP Individual Time: 1400-1500 SLP Individual Time Calculation (min): 60 min  Short Term Goals: Week 2: SLP Short Term Goal 1 (Week 2): Pt will sustain his attention to a basic, familiar task for 10 minutes with min verbal cues for redirection.  SLP Short Term Goal 2 (Week 2): Pt will complete a basic, familiar task with min assist verbal cues for functional problem solving.   SLP Short Term Goal 3 (Week 2): Pt will utilize external aids to orient to date and situation with supervision verbal cues.   SLP Short Term Goal 4 (Week 2): Pt will return demonstration of how to connect and disconnect from battery power of LVAD with min assist verbal and tactile cues.   Skilled Therapeutic Interventions: Skilled treatment session focused on cognitive goals. SLP facilitated session by providing Max-Total A for use of the calendar for orientation to date. Patient also required overall Max A multimodal cues from clinician and family to recall basic, biographical information. A handout was created to maximize recall of information. Patient left upright in recliner with family present. Continue with current plan of care.    Function:  Cognition Comprehension Comprehension assist level: Understands basic 90% of the time/cues < 10% of the time  Expression   Expression assist level: Expresses basic 90% of the time/requires cueing < 10% of the time.  Social Interaction Social Interaction assist level: Interacts appropriately 75 - 89% of the time - Needs redirection for appropriate language or to initiate interaction.  Problem Solving Problem solving assist level: Solves basic 75 - 89% of the time/requires cueing 10 - 24% of the time  Memory Memory assist level: Recognizes or recalls 50 - 74% of the time/requires cueing 25 - 49% of the time     Pain No/Denies Pain  Therapy/Group: Individual Therapy  Kenijah Benningfield 09/27/2015, 4:44 PM

## 2015-09-27 NOTE — Progress Notes (Signed)
Patient ID: [name], male   DOB: 26-Mar-1940, 75 y.o.   MRN: [id number]   HeartMate 2 Rounding Note  Subjective:    75 y/o with LMNA cardiomyopathy admitted 6/14 with low output HF  Underwent cath 6/15. Normal cors. EF 10% with low cardiac output.   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   Foley placed 7/3 for urinary retention. S/P CT with 1700 cc fluid out.   Ramp echo 7/10 with increase in speed to 9400 rpm. Speed decreased to 9200 09/24/15. PI events went from 22 > 7 > 2  Continues to improve working with PT. Still struggling with appetite, family pushing him to eat more of his meals. Bring some food from home this evening to see if that helps. Up most of the night with constipation which may be contributing to appetite. Had hard BM this am. Tentative discharge 10/02/15.    Weight shows down 4 lbs but again, question accuracy. Labs stable.   LVAD INTERROGATION:  HeartMate II LVAD:  Flow 4.9 liters/min, speed 9200, power 5.1, PI 6.1  Objective:    Vital Signs:   Temp:  [98 F (36.7 C)-98.2 F (36.8 C)] 98 F (36.7 C) (07/21 0552) Pulse Rate:  [64] 64 (07/20 1350) Resp:  [18] 18 (07/20 1350) SpO2:  [100 %] 100 % (07/20 1350) Weight:  [161 lb 9.6 oz (73.3 kg)] 161 lb 9.6 oz (73.3 kg) (07/21 0500) Last BM Date: 09/25/15 Mean arterial Pressure 70-80s  Intake/Output:   Intake/Output Summary (Last 24 hours) at 09/27/15 1336 Last data filed at 09/27/15 1300  Gross per 24 hour  Intake    300 ml  Output   1200 ml  Net   -900 ml     Physical Exam: General: In the chair, resting. NAD.  HEENT: normal  Neck: supple. JVP stable ~6-7 cm Cor: LVAD hum.  Lungs: Clear. Normal effort Abdomen: soft, NT, ND, no HSM. No bruits or masses. +BS  Driveline site ok. Securement device in place. Extremities: no cyanosis, clubbing, rash.  trace ankle edema. TED hose in place.  Neuro: Alert and oriented to person and place  Labs: Basic Metabolic Panel:  Recent Labs Lab 09/23/15 0512  09/24/15 0445 09/25/15 0445 09/26/15 1225 09/27/15 0355  NA 139 141 140 139 141  K 4.0 3.9 4.0 3.9 4.4  CL 107 108 106 108 108  CO2 28 28 28 25 28   GLUCOSE 94 87 87 143* 94  BUN 10 10 12 12 11   CREATININE 0.90 0.82 0.90 1.00 0.87  CALCIUM 8.5* 8.7* 8.6* 8.7* 8.5*  MG  --   --   --  2.3  --     Liver Function Tests: No results for input(s): AST, ALT, ALKPHOS, BILITOT, PROT, ALBUMIN in the last 168 hours. No results for input(s): LIPASE, AMYLASE in the last 168 hours. No results for input(s): AMMONIA in the last 168 hours.  CBC:  Recent Labs Lab 09/22/15 0417 09/25/15 0445 09/26/15 0420 09/27/15 0355  WBC 3.9* 3.7* 3.5* 3.9*  HGB 8.3* 8.4* 8.3* 8.8*  HCT 27.1* 27.6* 28.7* 30.1*  MCV 85.5 85.2 86.2 85.8  PLT 158 144* 135* 141*    INR:  Recent Labs Lab 09/23/15 0512 09/24/15 0445 09/25/15 0445 09/26/15 0420 09/27/15 0355  INR 2.45* 1.92* 2.00* 2.10* 2.02*    Other results:  EKG:   Imaging: Dg Chest 2 View  09/26/2015  CLINICAL DATA:  Placement of left ventricular in cyst device on August 30, 2015, chest soreness.  EXAM: CHEST  2 VIEW COMPARISON:  PA and lateral chest x-ray of September 18, 2015 FINDINGS: The lungs are mildly hypoinflated. The lung markings are coarse in the right infrahilar region but are relatively stable. There is no pleural effusion or pneumothorax. The cardiac silhouette is enlarged. The left ventricular assist device appears to be in stable position. The permanent pacemaker defibrillator also appears to be stable. The sternal wires are intact. IMPRESSION: Mild hypoinflation. No definite acute cardiopulmonary abnormality. The left ventricular assist device and the implantable pacemaker defibrillator appear to be in stable position. Electronically Signed   By: David  [place] M.D.   On: 09/26/2015 07:54     Medications:     Scheduled Medications: . amiodarone  200 mg Oral BID  . aspirin EC  81 mg Oral Daily  . bisacodyl  10 mg Oral Daily   Or  .  bisacodyl  10 mg Rectal Daily  . docusate sodium  200 mg Oral BID  . feeding supplement  1 Container Oral Q1500  . feeding supplement (ENSURE ENLIVE)  237 mL Oral BID BM  . ferrous fumarate-b12-vitamic C-folic acid  1 capsule Oral TID PC  . mirtazapine  30 mg Oral QHS  . oxybutynin  5 mg Oral QHS  . pantoprazole  40 mg Oral Daily  . polyethylene glycol  17 g Oral Daily  . protein supplement  1 scoop Oral TID WC  . sodium chloride flush  10-40 mL Intracatheter Q12H  . tamsulosin  0.4 mg Oral Daily  . warfarin  3 mg Oral q1800  . Warfarin - Pharmacist Dosing Inpatient   Does not apply q1800    Infusions:    PRN Medications: acetaminophen, ondansetron **OR** ondansetron (ZOFRAN) IV, polyvinyl alcohol, sodium chloride flush, sorbitol, traMADol   Assessment:   1. 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 EF 15%. Cath 8/14 with normal cors. 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  2. Frequent PVCs 3. Severe malnutrition- Prealbumin 14.7 on 6/20 4. Acute post-op delirium/sundowning 5. Urinary Retention requiring Foley 6. Left pleural effusion s/p chest tube  Plan/Discussion:    Continue working with PT and pushing oral intake. Appetite waxes and wanes. Give miralax for constipation.   Pharmacy dosing coumadin, goal INR 2-2.5.  INR 2.02 today.  MAP stable.   Pt still has PICC line in for blood work. Will consider removing in next day or two.   Isolated power spike noted (10.4) week of 09/18/15. Event log was reviewed thought to be pump burn in.  LDH stable at 214 today. (Initially read as > 400 but this was hemolysis)  Appreciate CIR and input. Continue therapy. We will follow along with recommendations.   I reviewed the LVAD parameters from today, and compared the results to the patient's prior recorded data.  No programming changes were made.  The LVAD is functioning within specified parameters.  The patient performs LVAD self-test daily.  LVAD  interrogation was negative for any significant power changes, alarms or PI events/speed drops.  LVAD equipment check completed and is in good working order.  Back-up equipment present.   LVAD education done on emergency procedures and precautions and reviewed exit site care.  Length of Stay: [address]  [employer] 09/27/2015, 1:36 PM  VAD Team --- VAD ISSUES ONLY--- Pager [phone] (7am - 7am)  Advanced Heart Failure Team  Pager [phone] (M-F; 7a - 4p)  Please contact [facility] for night-coverage after hours (4p -7a ) and  weekends on [email or web address]  Patient seen with PA, agree with the above note.  Minimal fall in PI today, think pacemaker adjustment yesterday may have helped.  Continues to work with PT.  No changes today.   Marca Ancona 09/27/2015 4:44 PM

## 2015-09-27 NOTE — Progress Notes (Signed)
Subjective/Complaints: Was up tryiung to have BM last noc   ROS- no CP, SOB, N/V/D  Objective: Vital Signs: Blood pressure 74/55, pulse 64, temperature 98 F (36.7 C), temperature source Oral, resp. rate 18, weight 73.3 kg (161 lb 9.6 oz), SpO2 100 %. Dg Chest 2 View  09/26/2015  CLINICAL DATA:  Placement of left ventricular in cyst device on August 30, 2015, chest soreness. EXAM: CHEST  2 VIEW COMPARISON:  PA and lateral chest x-ray of September 18, 2015 FINDINGS: The lungs are mildly hypoinflated. The lung markings are coarse in the right infrahilar region but are relatively stable. There is no pleural effusion or pneumothorax. The cardiac silhouette is enlarged. The left ventricular assist device appears to be in stable position. The permanent pacemaker defibrillator also appears to be stable. The sternal wires are intact. IMPRESSION: Mild hypoinflation. No definite acute cardiopulmonary abnormality. The left ventricular assist device and the implantable pacemaker defibrillator appear to be in stable position. Electronically Signed   By: David  Martinique M.D.   On: 09/26/2015 07:54   Results for orders placed or performed during the hospital encounter of 09/19/15 (from the past 72 hour(s))  Protime-INR     Status: Abnormal   Collection Time: 09/25/15  4:45 AM  Result Value Ref Range   Prothrombin Time 22.6 (H) 11.6 - 15.2 seconds   INR 2.00 (H) 0.00 - 1.49  Lactate dehydrogenase     Status: None   Collection Time: 09/25/15  4:45 AM  Result Value Ref Range   LDH 182 98 - 192 U/L  Basic metabolic panel     Status: Abnormal   Collection Time: 09/25/15  4:45 AM  Result Value Ref Range   Sodium 140 135 - 145 mmol/L   Potassium 4.0 3.5 - 5.1 mmol/L   Chloride 106 101 - 111 mmol/L   CO2 28 22 - 32 mmol/L   Glucose, Bld 87 65 - 99 mg/dL   BUN 12 6 - 20 mg/dL   Creatinine, Ser 0.90 0.61 - 1.24 mg/dL   Calcium 8.6 (L) 8.9 - 10.3 mg/dL   GFR calc non Af Amer >60 >60 mL/min   GFR calc Af Amer >60  >60 mL/min    Comment: (NOTE) The eGFR has been calculated using the CKD EPI equation. This calculation has not been validated in all clinical situations. eGFR's persistently <60 mL/min signify possible Chronic Kidney Disease.    Anion gap 6 5 - 15  CBC     Status: Abnormal   Collection Time: 09/25/15  4:45 AM  Result Value Ref Range   WBC 3.7 (L) 4.0 - 10.5 K/uL   RBC 3.24 (L) 4.22 - 5.81 MIL/uL   Hemoglobin 8.4 (L) 13.0 - 17.0 g/dL   HCT 27.6 (L) 39.0 - 52.0 %   MCV 85.2 78.0 - 100.0 fL   MCH 25.9 (L) 26.0 - 34.0 pg   MCHC 30.4 30.0 - 36.0 g/dL   RDW 17.3 (H) 11.5 - 15.5 %   Platelets 144 (L) 150 - 400 K/uL  Urinalysis, Routine w reflex microscopic (not at [facility])     Status: None   Collection Time: 09/25/15 11:34 PM  Result Value Ref Range   Color, Urine YELLOW YELLOW   APPearance CLEAR CLEAR   Specific Gravity, Urine 1.008 1.005 - 1.030   pH 7.0 5.0 - 8.0   Glucose, UA NEGATIVE NEGATIVE mg/dL   Hgb urine dipstick NEGATIVE NEGATIVE   Bilirubin Urine NEGATIVE NEGATIVE   Ketones, ur NEGATIVE  NEGATIVE mg/dL   Protein, ur NEGATIVE NEGATIVE mg/dL   Nitrite NEGATIVE NEGATIVE   Leukocytes, UA NEGATIVE NEGATIVE    Comment: MICROSCOPIC NOT DONE ON URINES WITH NEGATIVE PROTEIN, BLOOD, LEUKOCYTES, NITRITE, OR GLUCOSE <1000 mg/dL.  Protime-INR     Status: Abnormal   Collection Time: 09/26/15  4:20 AM  Result Value Ref Range   Prothrombin Time 23.4 (H) 11.6 - 15.2 seconds   INR 2.10 (H) 0.00 - 1.49  Lactate dehydrogenase     Status: None   Collection Time: 09/26/15  4:20 AM  Result Value Ref Range   LDH 181 98 - 192 U/L  CBC     Status: Abnormal   Collection Time: 09/26/15  4:20 AM  Result Value Ref Range   WBC 3.5 (L) 4.0 - 10.5 K/uL   RBC 3.33 (L) 4.22 - 5.81 MIL/uL   Hemoglobin 8.3 (L) 13.0 - 17.0 g/dL   HCT 28.7 (L) 39.0 - 52.0 %   MCV 86.2 78.0 - 100.0 fL   MCH 24.9 (L) 26.0 - 34.0 pg   MCHC 28.9 (L) 30.0 - 36.0 g/dL   RDW 17.4 (H) 11.5 - 15.5 %   Platelets 135 (L) 150  - 400 K/uL  Basic metabolic panel     Status: Abnormal   Collection Time: 09/26/15 12:25 PM  Result Value Ref Range   Sodium 139 135 - 145 mmol/L   Potassium 3.9 3.5 - 5.1 mmol/L   Chloride 108 101 - 111 mmol/L   CO2 25 22 - 32 mmol/L   Glucose, Bld 143 (H) 65 - 99 mg/dL   BUN 12 6 - 20 mg/dL   Creatinine, Ser 1.00 0.61 - 1.24 mg/dL   Calcium 8.7 (L) 8.9 - 10.3 mg/dL   GFR calc non Af Amer >60 >60 mL/min   GFR calc Af Amer >60 >60 mL/min    Comment: (NOTE) The eGFR has been calculated using the CKD EPI equation. This calculation has not been validated in all clinical situations. eGFR's persistently <60 mL/min signify possible Chronic Kidney Disease.    Anion gap 6 5 - 15  Magnesium     Status: None   Collection Time: 09/26/15 12:25 PM  Result Value Ref Range   Magnesium 2.3 1.7 - 2.4 mg/dL  Protime-INR     Status: Abnormal   Collection Time: 09/27/15  3:55 AM  Result Value Ref Range   Prothrombin Time 22.8 (H) 11.6 - 15.2 seconds   INR 2.02 (H) 0.00 - 1.49  Lactate dehydrogenase     Status: Abnormal   Collection Time: 09/27/15  3:55 AM  Result Value Ref Range   LDH 433 (H) 98 - 192 U/L    Comment: SPECIMEN HEMOLYZED. HEMOLYSIS MAY AFFECT INTEGRITY OF RESULTS.  CBC     Status: Abnormal   Collection Time: 09/27/15  3:55 AM  Result Value Ref Range   WBC 3.9 (L) 4.0 - 10.5 K/uL   RBC 3.51 (L) 4.22 - 5.81 MIL/uL   Hemoglobin 8.8 (L) 13.0 - 17.0 g/dL   HCT 30.1 (L) 39.0 - 52.0 %   MCV 85.8 78.0 - 100.0 fL   MCH 25.1 (L) 26.0 - 34.0 pg   MCHC 29.2 (L) 30.0 - 36.0 g/dL   RDW 17.5 (H) 11.5 - 15.5 %   Platelets 141 (L) 150 - 400 K/uL  Basic metabolic panel     Status: Abnormal   Collection Time: 09/27/15  3:55 AM  Result Value Ref Range   Sodium 141  135 - 145 mmol/L   Potassium 4.4 3.5 - 5.1 mmol/L   Chloride 108 101 - 111 mmol/L   CO2 28 22 - 32 mmol/L   Glucose, Bld 94 65 - 99 mg/dL   BUN 11 6 - 20 mg/dL   Creatinine, Ser 0.87 0.61 - 1.24 mg/dL   Calcium 8.5 (L) 8.9 -  10.3 mg/dL   GFR calc non Af Amer >60 >60 mL/min   GFR calc Af Amer >60 >60 mL/min    Comment: (NOTE) The eGFR has been calculated using the CKD EPI equation. This calculation has not been validated in all clinical situations. eGFR's persistently <60 mL/min signify possible Chronic Kidney Disease.    Anion gap 5 5 - 15     HEENT: normal Cardio: LVAD hum Resp: CTA B/L and unlabored GI: NT, ND Extremity:  Pulses positive and No Edema Skin:   Breakdown sacral gr 2 Neuro: Confused and Abnormal Motor 4/5 in BUE and BLE Musc/Skel:  Other no pain with UE or LE ROM Gen NAD   Assessment/Plan: 1. Functional deficits secondary to deconditioning and encephalopathy which require 3+ hours per day of interdisciplinary therapy in a comprehensive inpatient rehab setting. Physiatrist is providing close team supervision and 24 hour management of active medical problems listed below. Physiatrist and rehab team continue to assess barriers to discharge/monitor patient progress toward functional and medical goals. FIM: Function - Bathing Position: Wheelchair/chair at sink Body parts bathed by patient: Right arm, Left arm, Chest, Front perineal area, Right upper leg, Left upper leg, Abdomen, Buttocks, Right lower leg, Left lower leg Body parts bathed by helper: Back Bathing not applicable: Left lower leg, Right lower leg Assist Level: Supervision or verbal cues  Function- Upper Body Dressing/Undressing What is the patient wearing?: Pull over shirt/dress Pull over shirt/dress - Perfomed by patient: Thread/unthread right sleeve, Thread/unthread left sleeve, Pull shirt over trunk, Put head through opening Pull over shirt/dress - Perfomed by helper: Put head through opening, Thread/unthread right sleeve, Thread/unthread left sleeve, Pull shirt over trunk Assist Level: Touching or steadying assistance(Pt > 75%) Set up : To obtain clothing/put away Function - Lower Body Dressing/Undressing What is the  patient wearing?: Underwear, Pants, Maryln Manuel, Shoes Position: Education officer, museum at [employer] - Performed by patient: Thread/unthread right underwear leg, Pull underwear up/down Underwear - Performed by helper: Thread/unthread left underwear leg Pants- Performed by patient: Thread/unthread left pants leg, Pull pants up/down Pants- Performed by helper: Thread/unthread right pants leg Non-skid slipper socks- Performed by patient: Don/doff right sock, Don/doff left sock Non-skid slipper socks- Performed by helper: Don/doff right sock, Don/doff left sock Shoes - Performed by patient: Don/doff right shoe, Don/doff left shoe TED Hose - Performed by helper: Don/doff right TED hose, Don/doff left TED hose Assist for footwear: Partial/moderate assist Assist for lower body dressing: Touching or steadying assistance (Pt > 75%) (Mod assist sit > stand)  Function - Toileting Toileting steps completed by patient: Adjust clothing after toileting Toileting steps completed by helper: Adjust clothing prior to toileting, Performs perineal hygiene Toileting Assistive Devices: Grab bar or rail Assist level: Touching or steadying assistance (Pt.75%)  Function - Air cabin crew transfer assistive device: Grab bar Assist level to toilet: Touching or steadying assistance (Pt > 75%) Assist level from toilet: Touching or steadying assistance (Pt > 75%) Assist level to bedside commode (at bedside): Moderate assist (Pt 50 - 74%/lift or lower) Assist level from bedside commode (at bedside): Moderate assist (Pt 50 - 74%/lift or lower)  Function -  Chair/bed transfer Chair/bed transfer method: Ambulatory Chair/bed transfer assist level: Touching or steadying assistance (Pt > 75%) Chair/bed transfer assistive device: Walker Chair/bed transfer details: Verbal cues for technique, Verbal cues for precautions/safety  Function - Locomotion: Wheelchair Type: Manual (bil LE propulsion) Wheelchair activity did  not occur: Safety/medical concerns (sternal precautions) Max wheelchair distance: 150 Assist Level: Supervision or verbal cues Wheel 50 feet with 2 turns activity did not occur: Safety/medical concerns Assist Level: Supervision or verbal cues Wheel 150 feet activity did not occur: Safety/medical concerns Assist Level: Supervision or verbal cues Turns around,maneuvers to table,bed, and toilet,negotiates 3% grade,maneuvers on rugs and over doorsills: No Function - Locomotion: Ambulation Assistive device: Walker-rolling Max distance: 175 Assist level: Supervision or verbal cues Assist level: Supervision or verbal cues Walk 50 feet with 2 turns activity did not occur: Safety/medical concerns Assist level: Supervision or verbal cues Walk 150 feet activity did not occur: Safety/medical concerns Assist level: Supervision or verbal cues Walk 10 feet on uneven surfaces activity did not occur: Safety/medical concerns  Function - Comprehension Comprehension: Auditory Comprehension assist level: Understands basic 90% of the time/cues < 10% of the time  Function - Expression Expression: Verbal Expression assist level: Expresses basic 90% of the time/requires cueing < 10% of the time.  Function - Social Interaction Social Interaction assist level: Interacts appropriately 75 - 89% of the time - Needs redirection for appropriate language or to initiate interaction.  Function - Problem Solving Problem solving assist level: Solves basic 75 - 89% of the time/requires cueing 10 - 24% of the time  Function - Memory Memory assist level: Recognizes or recalls 50 - 74% of the time/requires cueing 25 - 49% of the time Patient normally able to recall (first 3 days only): Current season, That he or she is in a hospital  1. Debilitation secondary to acute on chronic systolic congestive heart failure/recent LVAD 64/33/2951 complicated by left pleural effusion with chest tube Continue CIR PT, OT,  SLP Planned d/c 7/26 for supervision level.   2. DVT Prophylaxis/Anticoagulation: Chronic Coumadin therapy. Monitor for any bleeding episodes 3. Pain Management: Ultram as needed 4. Mood: Remeron increase to 30 mg daily at bedtime, Zyprexa weaned down to 2.5 mg daily at bedtime will d/c  5. Neuropsych: This patient is capable of making decisions on his own behalf. 6. Skin/Wound Care: Routine skin checks 7. Fluids/Electrolytes/Nutrition: Routine I&O's with follow-up chemistries 8. Acute blood loss anemia. Follow-up CBC, Hemoglobins running in the 8 range 9. Hypertension/atrial fibrillation. Amiodarone 200 mg twice a day, Aldactone 25 mg daily. Monitor with increased mobility 10. BPH. Flomax 0.4 mg daily. Check PVR 3 11. Decreased nutritional storage. Follow-up dietary consult, enc protein intake, trial beneprotein, Appreciate dietary f/u 12. Memory deficits, per wife this was not a pre-existing problem.+encephalopathy 13.  Freq urination-  oxybutnin low dose, still up 3 times, Neg UA, increase dose 14.  Constipation, hard stool, increase colace, likely needs supp today, d/w RN LOS (Days) 8 A FACE TO FACE EVALUATION WAS PERFORMED  Jaelee Laughter E 09/27/2015, 8:21 AM

## 2015-09-27 NOTE — Progress Notes (Signed)
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 Implant hospitalization was from 08/22/15 - 09/19/15.   Vital signs: Temo: 98.0 HR: 60 Doppler MAP: 82  Wt in lbs:  179 > 170 > 168 > 167 > 161 > 157 > 165 > 165 > 161   LVAD interrogation reveals:  Speed: 9200 Flow: 5.1 Power: 5.4w PI: 6.6 Alarms: none Events: 4 PI events  Fixed speed:  9200 Low speed limit: 8600  Drive Line:  C/D/I. Daily dressing changes using guaze dressing with Aquacel silver strip on exit site being maintained per wife. Dressing supplies at bedside.     Labs:  LDH trend:  196 > 266 > 188 > 190 > 182 > 181 > 214  INR trend: 2.12 > 2.47 > 3.0 > 2.95 > 2.45 > 2.0 > 1.0 > 2.02  Hgb:  8.5 > 9.0 > 8.3 > 8.4 > 8.3 > 8.8  Antithrombotic Management:   INR goal: 2.0 - 2.5 ASA 81 mg      Plan/Recommendations:  1. ICD device interrogation performed yesterday with device programming. No further alarms reported from staff or family. 2. Less drop in PI today with orthostatic changes; baseline PI 6.6 lying; 5.1 with standing.  3. Pt c/o increased pain from sacral skin breakdown. Nurse aware, current plan Mepilex dressing during day, barrier     cream at night.  4.  No family at bedside for equipment review today. PT plans on working with wife and family if available.  4.  Nurse will attempt to obtain loveseat/recliner to improve wife's comfort.    Hessie Diener, RN VAD Coordinator  Office: [phone] 24/7 Emergency VAD Pager: [phone]

## 2015-09-27 NOTE — Progress Notes (Signed)
Occupational Therapy daily and Weekly Progress Note  Patient Details  Name: [name] MRN: [id number] Date of Birth: 1940/06/19  Beginning of progress report period: September 20, 2015 End of progress report period: September 27, 2015  Today's Date: 09/27/2015 OT Individual Time: 4650-3546 OT Individual Time Calculation (min): 45 min  and Today's Date: 09/27/2015 OT Missed Time: 30 Minutes Missed Time Reason: Nursing care;Patient ill (comment) (RN needing pt to have a BM )   1:1 Nursing reporting pt needing to have a BM today (medical issue). Therapeutic activity. Focus on functional mobility around room and unit to help to induce a BM due to difficulty. RN had ordered an edema.  Pt required total cuing for navigating around unit and mod A throughout session to maintain safety with RW. PT able to demonstrate functional mobility with RW with supervision without hands on assistance. Pt not fully oriented to self (did not know his age). Also noted some language of confusion when talking about his brother and his heart condition. Pt with difficulty following basic directions during balance tasks (sequencing alternating toe tapping on one step). REturned to restroom to attempt BM with nursing.  Patient has met 4 of 5 short term goals.  Pt continues to require min to max A for basic dressing tasks due to decr sequencing/ organization, attention and working memory. Pt able to bathe at sink level with supervision with VC for sequence for thoroughness. Pt able to ambulation around unit and room with supervision with a RW and min A without. Pt still demonstrates significant cognitive impairments impacting his ability to operate his LVAD independently and make safe decisions about self care familiar tasks. Family is aware of this and understands 24 hr supervision is recommended.   Patient continues to demonstrate the following deficits: muscle weakness, decreased cardiorespiratoy endurance, decreased attention,  decreased awareness, decreased problem solving, decreased safety awareness and decreased memory and decreased balance strategies and difficulty maintaining precautions and therefore will continue to benefit from skilled OT intervention to enhance overall performance with BADL and Reduce care partner burden.  Patient progressing toward long term goals..  Continue plan of care. More cognition goals have been added to continue to address decr memory, attention and awareness.  OT Short Term Goals Week 1:  OT Short Term Goal 1 (Week 1): Pt will complete UB dressing with supervision while adhering to sternal precautions with min cues OT Short Term Goal 1 - Progress (Week 1): Met OT Short Term Goal 2 (Week 1): Pt will complete toilet transfers with min assist while adhering to sternal precautions with min cues OT Short Term Goal 2 - Progress (Week 1): Met OT Short Term Goal 3 (Week 1): Pt will complete LB dressing with min assist at sit > stand level while adhering to sternal precautions OT Short Term Goal 3 - Progress (Week 1): Progressing toward goal OT Short Term Goal 4 (Week 1): Pt will complete bathing with min assist at sit > stand levle while adhering to sternal precautions OT Short Term Goal 4 - Progress (Week 1): Met OT Short Term Goal 5 (Week 1): Pt will tolerate standing for 4 mins while completing grooming tasks with supervision OT Short Term Goal 5 - Progress (Week 1): Met Week 2:  OT Short Term Goal 1 (Week 2): LTG=STG OT Short Term Goal 2 (Week 2): Family education reguarding cogntion and how to provide cues and 24 hr supervision  Skilled Therapeutic Interventions/Progress Updates:    continue with POC  Therapy  Documentation Precautions:  Precautions Precautions: Sternal Precaution Comments: LVAD, Call VAD Pager [phone]. Flow less than 2.5, or sustained increase or decrease of 2.0 L Restrictions Weight Bearing Restrictions: No RUE Weight Bearing: Non weight bearing LUE Weight  Bearing: Non weight bearing General:   Vital Signs: Therapy Vitals Temp: 98 F (36.7 C) Temp Source: Oral Pain:   ADL:   Exercises:   Other Treatments:    See Function Navigator for Current Functional Status.   Therapy/Group: Individual Therapy  Willeen Cass [facility] 09/27/2015, 7:48 AM

## 2015-09-27 NOTE — Progress Notes (Signed)
Physical Therapy Weekly Progress Note  Patient Details  Name: [name] MRN: [id number] Date of Birth: Nov 20, 1940  Beginning of progress report period: September 20, 2015 End of progress report period: September 27, 2015  Today's Date: 09/27/2015 PT Individual Time: 1100-1200 PT Individual Time Calculation (min): 60 min   Patient has met 4 of 4 short term goals.  Pt currently requires minA to S overall for transfers, gait and dynamic standing balance. Occasionally requires minA for sit <>stand due to sternal precautions. Pt is progressing physically with strength,activity tolerance, and dynamic balance, however is primarily limited by cognitive impairments, poor memory, attention, processing and poor carryover from one session to the next. Pt requires mod/max verbal cueing for all tasks for sequencing, safety with RW, and LVAD management. Plan to continue extensive hands-on education with wife for LVAD management and safety with RW to prepare for d/c home.   Patient continues to demonstrate the following deficits:  activity tolerance, balance, postural control, ability to compensate for deficits, attention, awareness, coordination and knowledge of precautions and therefore will continue to benefit from skilled PT intervention to enhance overall performance with bed mobility, transfers, gait, home and community access.  Patient progressing toward long term goals..  Continue plan of care.  PT Short Term Goals Week 1:  PT Short Term Goal 1 (Week 1): Pt will perform bed mobility on flat bed with min A while maintaining sternal precautions PT Short Term Goal 1 - Progress (Week 1): Met PT Short Term Goal 2 (Week 1): Pt will perform sit <> stand and stand pivot transfers with min A from various heights of surfaces while maintaining sternal precautions PT Short Term Goal 2 - Progress (Week 1): Met PT Short Term Goal 3 (Week 1): Pt will perform gait x 100' with RW and min A  PT Short Term Goal 3 -  Progress (Week 1): Met PT Short Term Goal 4 (Week 1): Pt will negotiate 4 stairs (6 inches) with 2 rails with min A  PT Short Term Goal 4 - Progress (Week 1): Met Week 2:  PT Short Term Goal 1 (Week 2): =LTG due to estimated LOS, S overall  Skilled Therapeutic Interventions/Progress Updates:    Pt received seated on BSC over toilet; requires several minutes to complete continent bowel movement. Sit <>stand min guard with therapist performing hygiene totalA. Pt performed lower body dressing in standing with BUE support on RW, therapist assisting with threading BLEs. Seated on EOB, performed upper body dressing totalA for management of LVAD equipment, minA for actual donning of shirt, with pt initially putting arms in through head opening, required assist for orienting to shirt for correct sequencing. Gait to gym with RW and close S x160'. Gait weaving around cones for RW management, max multimodal cues for correct sequencing to alternate R/L around cones, as well as cues for maintaining feet within RW. Pt also repeatedly leaves behind RW when preparing to sit, requires cues every time he takes a rest break. Stairs 1x12 with BUE handrails and close S. Returned to room with gait x160' S with RW; max verbal cues for sequencing upon returning to room in order to maintain RW in front of body while turning to sit in recliner. Quick release belt intact and all needs in reach at end of session.   Therapy Documentation Precautions:  Precautions Precautions: Sternal Precaution Comments: LVAD, Call VAD Pager [phone]. Flow less than 2.5, or sustained increase or decrease of 2.0 L Restrictions Weight Bearing Restrictions: No  RUE Weight Bearing: Non weight bearing LUE Weight Bearing: Non weight bearing  See Function Navigator for Current Functional Status.  Therapy/Group: Individual Therapy  Luberta Mutter 09/27/2015, 12:22 PM

## 2015-09-27 NOTE — Patient Care Conference (Signed)
Inpatient RehabilitationTeam Conference and Plan of Care Update Date: 09/25/2015   Time: 11:20 PM    Patient Name: [name]      Medical Record Number: [id number]  Date of Birth: 1940-09-01 Sex: Male         Room/Bed: 4W20C/4W20C-01 Payor Info: Payor: MEDICARE / Plan: MEDICARE PART A AND B / Product Type: *No Product type* /    Admitting Diagnosis: LAVD  Admit Date/Time:  09/19/2015  6:19 PM Admission Comments: No comment available   Primary Diagnosis:  <principal problem not specified> Principal Problem: <principal problem not specified>  Patient Active Problem List   Diagnosis Date Noted  . Presence of left ventricular assist device (LVAD) (HCC)   . Chronic systolic CHF (congestive heart failure) (HCC)   . Physical deconditioning 09/19/2015  . 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 Debilitated 09/19/2015  . Other congestive heart failure (HCC)   . Encephalopathy   . H/O chest tube placement   . LBBB (left bundle branch block)   . Generalized OA   . Prediabetes   . Acute delirium   . LVAD (left ventricular assist device) present (HCC)   . Insomnia   . Protein-calorie malnutrition, severe 08/23/2015  . Palliative care encounter   . Goals of care, counseling/discussion   . Acute on chronic systolic (congestive) heart failure (HCC) 08/21/2015  . Acute on chronic systolic CHF (congestive heart failure) (HCC) 08/21/2015  . PVC (premature ventricular contraction) 01/04/2015  . 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 Symptomatic bradycardia 11/28/2012  . 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 Chronic systolic heart failure (HCC) 10/27/2012  . 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 Left bundle branch block 10/06/2012  . Syncope 10/06/2012    Expected Discharge Date: Expected Discharge Date: 10/04/15  Team Members Present: Physician leading conference: Dr. Claudette Laws Social Worker Present: Staci Acosta, LCSW Nurse Present: Carmie End,  RN PT Present: Alyson Reedy, PT OT Present: Rosalio Loud, OT SLP Present: Jackalyn Lombard, SLP PPS Coordinator present : Tora Duck, RN, CRRN     Current Status/Progress Goal Weekly Team Focus  Medical   cannot manage LVAD, intermittent confusion , perceptual issues during dressing  home with family, [facility] and MD f/u  increase endurance   Bowel/Bladder   Cont x2 with urgency; LBM 7/18  Remain cont x2  Continue toileting patient with appropriate devices   Swallow/Nutrition/ Hydration             ADL's   Mod assist sit > stand d/t sternal precautions, min assist bathing and dressing, mod assist transfers d/t sternal precautions, impaired cognition, awareness, sequencing, orientation with familiar self-care tasks  supervision overall  sit > stand, recall of sternal precautions and LVAD battery change, activity tolerance, safety, pt/family education   Mobility   minA sit <>stand d/t sternal precautions, S bed mobility, min guard transfers and gait with no AD,   S overall maintaining precautions  LE strengthening for sit <>stand, activity tolerance, dynamic standing balance, pt/family education   Communication             Safety/Cognition/ Behavioral Observations  Mod assist   min assist   orientation, recall of daily information, attention to tasks   Pain   Occasional L rib pain at VAD placement (  < 3 on 0-10 pain scale  Assess and treat pain   Skin   stage II R buttock; driveline daily dressing change, VAD dressing L chest; BLE edema   No new breakdown while on rehab  Continue monitoring skin qshift, encourage patient to stay off back/reposition     Rehab Goals Patient on target to meet rehab goals: Yes Rehab Goals Revised: none *See Care Plan and progress notes for long and short-term goals.  Barriers to Discharge: impulsivity, distractable    Possible Resolutions to Barriers:  cont rehab, caregiver training    Discharge Planning/Teaching Needs:  Home with wife who can  provide supervision only.  To be scheduled for wife prior to d/c.   Team Discussion:  Pt with encephalopathy and deconditioning from severe CHF, now with LVAD.  Therapists are working to increase endurance level.  Pt is continent of bladder, but with urgency per RN.  Wife has been doing some dressing changes and nursing will continue to educate.  Pt needs a good bit of assistance to gt up from lower surfaces.  He needs lots of cues for dressing and will need 24/7 supervision due to being clear at times and not at other times.  Pt is steady assist for mobility and is a little impulsive.  Therapists will see if wife can do steady assist.  ST reports pt has cleared a bit, but will need to do lots of education with family about his cognition and how to properly cue pt.  Revisions to Treatment Plan:  none   Continued Need for Acute Rehabilitation Level of Care: The patient requires daily medical management by a physician with specialized training in physical medicine and rehabilitation for the following conditions: Daily direction of a multidisciplinary physical rehabilitation program to ensure safe treatment while eliciting the highest outcome that is of practical value to the patient.: Yes Daily medical management of patient stability for increased activity during participation in an intensive rehabilitation regime.: Yes Daily analysis of laboratory values and/or radiology reports with any subsequent need for medication adjustment of medical intervention for : Cardiac problems;Mood/behavior problems  Montoya Brandel, [name] 09/27/2015, 2:33 PM

## 2015-09-28 ENCOUNTER — Inpatient Hospital Stay (HOSPITAL_COMMUNITY): Payer: Medicare Other

## 2015-09-28 LAB — PROTIME-INR
INR: 2.44 — AB (ref 0.00–1.49)
PROTHROMBIN TIME: 26.2 s — AB (ref 11.6–15.2)

## 2015-09-28 LAB — BASIC METABOLIC PANEL
Anion gap: 4 — ABNORMAL LOW (ref 5–15)
BUN: 9 mg/dL (ref 6–20)
CHLORIDE: 109 mmol/L (ref 101–111)
CO2: 29 mmol/L (ref 22–32)
CREATININE: 0.91 mg/dL (ref 0.61–1.24)
Calcium: 8.8 mg/dL — ABNORMAL LOW (ref 8.9–10.3)
GFR calc Af Amer: >60 mL/min (ref >60)
GFR calc non Af Amer: >60 mL/min (ref >60)
Glucose, Bld: 83 mg/dL (ref 65–99)
Potassium: 4.2 mmol/L (ref 3.5–5.1)
SODIUM: 142 mmol/L (ref 135–145)

## 2015-09-28 LAB — CBC
HCT: 28.7 % — ABNORMAL LOW (ref 39.0–52.0)
Hemoglobin: 8.5 g/dL — ABNORMAL LOW (ref 13.0–17.0)
MCH: 25.6 pg — AB (ref 26.0–34.0)
MCHC: 29.6 g/dL — AB (ref 30.0–36.0)
MCV: 86.4 fL (ref 78.0–100.0)
PLATELETS: 135 10*3/uL — AB (ref 150–400)
RBC: 3.32 MIL/uL — ABNORMAL LOW (ref 4.22–5.81)
RDW: 17.8 % — AB (ref 11.5–15.5)
WBC: 3.5 10*3/uL — ABNORMAL LOW (ref 4.0–10.5)

## 2015-09-28 LAB — LACTATE DEHYDROGENASE: LDH: 202 U/L — ABNORMAL HIGH (ref 98–192)

## 2015-09-28 MED ORDER — WARFARIN SODIUM 2.5 MG PO TABS
2.5000 mg | ORAL_TABLET | Freq: Every day | ORAL | Status: DC
  Administered 2015-09-28: 2.5 mg via ORAL
  Filled 2015-09-28: qty 1

## 2015-09-28 NOTE — Progress Notes (Signed)
Physical Therapy Session Note  Patient Details  Name: FLORENTINO DANTIN MRN: [id number] Date of Birth: 12-Dec-1940  Today's Date: 09/28/2015 PT Individual Time: 1000-1100 PT Individual Time Calculation (min): 60 min   Short Term Goals: Week 2:  PT Short Term Goal 1 (Week 2): =LTG due to estimated LOS, S overall  Skilled Therapeutic Interventions/Progress Updates:    Pt's wife independently unhooked LVAD and set-up portable batteries with pt's vest.   Session focused on functional transfers, gait with RW in hallway for endurance and safety with AD as well as dynamic gait through obstacle course to simulate home environment mobility, dynamic standing balance activity on compliant surface for dual task task to address cognition for matching cards, and sit <> stands blocked practice training without UE support for improved transfers from lower surfaces (min assist initially progressing to close supervision and verbal cues for anterior weighshift). Pt required close supervision to min assist for basic transfers and gait due to poor control and safety with RW requiring physical assist at times to redirect. End of session set up in recliner with safety belt donned and wife present.   Therapy Documentation Precautions:  Precautions Precautions: Sternal Precaution Comments: LVAD, Call VAD Pager [phone]. Flow less than 2.5, or sustained increase or decrease of 2.0 L Restrictions Weight Bearing Restrictions: No RUE Weight Bearing: Non weight bearing LUE Weight Bearing: Non weight bearing  Pain: No complaints.    See Function Navigator for Current Functional Status.   Therapy/Group: Individual Therapy  Karolee Stamps Darrol Poke, PT, DPT  09/28/2015, 11:37 AM

## 2015-09-28 NOTE — Progress Notes (Signed)
Subjective/Complaints: Slept well , up to urinate once   ROS- no CP, SOB, N/V/D  Objective: Vital Signs: Blood pressure 74/55, pulse 67, temperature 98 F (36.7 C), temperature source Oral, resp. rate 17, weight 73.3 kg (161 lb 9.6 oz), SpO2 100 %. No results found. Results for orders placed or performed during the hospital encounter of 09/19/15 (from the past 72 hour(s))  Urinalysis, Routine w reflex microscopic (not at [facility])     Status: None   Collection Time: 09/25/15 11:34 PM  Result Value Ref Range   Color, Urine YELLOW YELLOW   APPearance CLEAR CLEAR   Specific Gravity, Urine 1.008 1.005 - 1.030   pH 7.0 5.0 - 8.0   Glucose, UA NEGATIVE NEGATIVE mg/dL   Hgb urine dipstick NEGATIVE NEGATIVE   Bilirubin Urine NEGATIVE NEGATIVE   Ketones, ur NEGATIVE NEGATIVE mg/dL   Protein, ur NEGATIVE NEGATIVE mg/dL   Nitrite NEGATIVE NEGATIVE   Leukocytes, UA NEGATIVE NEGATIVE    Comment: MICROSCOPIC NOT DONE ON URINES WITH NEGATIVE PROTEIN, BLOOD, LEUKOCYTES, NITRITE, OR GLUCOSE <1000 mg/dL.  Urine culture     Status: Abnormal   Collection Time: 09/25/15 11:34 PM  Result Value Ref Range   Specimen Description URINE, CLEAN CATCH    Special Requests NONE    Culture MULTIPLE SPECIES PRESENT, SUGGEST RECOLLECTION (A)    Report Status 09/27/2015 FINAL   Protime-INR     Status: Abnormal   Collection Time: 09/26/15  4:20 AM  Result Value Ref Range   Prothrombin Time 23.4 (H) 11.6 - 15.2 seconds   INR 2.10 (H) 0.00 - 1.49  Lactate dehydrogenase     Status: None   Collection Time: 09/26/15  4:20 AM  Result Value Ref Range   LDH 181 98 - 192 U/L  CBC     Status: Abnormal   Collection Time: 09/26/15  4:20 AM  Result Value Ref Range   WBC 3.5 (L) 4.0 - 10.5 K/uL   RBC 3.33 (L) 4.22 - 5.81 MIL/uL   Hemoglobin 8.3 (L) 13.0 - 17.0 g/dL   HCT 28.7 (L) 39.0 - 52.0 %   MCV 86.2 78.0 - 100.0 fL   MCH 24.9 (L) 26.0 - 34.0 pg   MCHC 28.9 (L) 30.0 - 36.0 g/dL   RDW 17.4 (H) 11.5 - 15.5 %    Platelets 135 (L) 150 - 400 K/uL  Basic metabolic panel     Status: Abnormal   Collection Time: 09/26/15 12:25 PM  Result Value Ref Range   Sodium 139 135 - 145 mmol/L   Potassium 3.9 3.5 - 5.1 mmol/L   Chloride 108 101 - 111 mmol/L   CO2 25 22 - 32 mmol/L   Glucose, Bld 143 (H) 65 - 99 mg/dL   BUN 12 6 - 20 mg/dL   Creatinine, Ser 1.00 0.61 - 1.24 mg/dL   Calcium 8.7 (L) 8.9 - 10.3 mg/dL   GFR calc non Af Amer >60 >60 mL/min   GFR calc Af Amer >60 >60 mL/min    Comment: (NOTE) The eGFR has been calculated using the CKD EPI equation. This calculation has not been validated in all clinical situations. eGFR's persistently <60 mL/min signify possible Chronic Kidney Disease.    Anion gap 6 5 - 15  Magnesium     Status: None   Collection Time: 09/26/15 12:25 PM  Result Value Ref Range   Magnesium 2.3 1.7 - 2.4 mg/dL  Protime-INR     Status: Abnormal   Collection Time: 09/27/15  3:55 AM  Result Value Ref Range   Prothrombin Time 22.8 (H) 11.6 - 15.2 seconds   INR 2.02 (H) 0.00 - 1.49  Lactate dehydrogenase     Status: Abnormal   Collection Time: 09/27/15  3:55 AM  Result Value Ref Range   LDH 433 (H) 98 - 192 U/L    Comment: SPECIMEN HEMOLYZED. HEMOLYSIS MAY AFFECT INTEGRITY OF RESULTS.  CBC     Status: Abnormal   Collection Time: 09/27/15  3:55 AM  Result Value Ref Range   WBC 3.9 (L) 4.0 - 10.5 K/uL   RBC 3.51 (L) 4.22 - 5.81 MIL/uL   Hemoglobin 8.8 (L) 13.0 - 17.0 g/dL   HCT 30.1 (L) 39.0 - 52.0 %   MCV 85.8 78.0 - 100.0 fL   MCH 25.1 (L) 26.0 - 34.0 pg   MCHC 29.2 (L) 30.0 - 36.0 g/dL   RDW 17.5 (H) 11.5 - 15.5 %   Platelets 141 (L) 150 - 400 K/uL  Basic metabolic panel     Status: Abnormal   Collection Time: 09/27/15  3:55 AM  Result Value Ref Range   Sodium 141 135 - 145 mmol/L   Potassium 4.4 3.5 - 5.1 mmol/L   Chloride 108 101 - 111 mmol/L   CO2 28 22 - 32 mmol/L   Glucose, Bld 94 65 - 99 mg/dL   BUN 11 6 - 20 mg/dL   Creatinine, Ser 0.87 0.61 - 1.24 mg/dL    Calcium 8.5 (L) 8.9 - 10.3 mg/dL   GFR calc non Af Amer >60 >60 mL/min   GFR calc Af Amer >60 >60 mL/min    Comment: (NOTE) The eGFR has been calculated using the CKD EPI equation. This calculation has not been validated in all clinical situations. eGFR's persistently <60 mL/min signify possible Chronic Kidney Disease.    Anion gap 5 5 - 15  Lactate dehydrogenase     Status: Abnormal   Collection Time: 09/27/15 10:42 AM  Result Value Ref Range   LDH 214 (H) 98 - 192 U/L  Protime-INR     Status: Abnormal   Collection Time: 09/28/15  4:15 AM  Result Value Ref Range   Prothrombin Time 26.2 (H) 11.6 - 15.2 seconds   INR 2.44 (H) 0.00 - 1.49  Lactate dehydrogenase     Status: Abnormal   Collection Time: 09/28/15  4:15 AM  Result Value Ref Range   LDH 202 (H) 98 - 192 U/L  CBC     Status: Abnormal   Collection Time: 09/28/15  4:15 AM  Result Value Ref Range   WBC 3.5 (L) 4.0 - 10.5 K/uL   RBC 3.32 (L) 4.22 - 5.81 MIL/uL   Hemoglobin 8.5 (L) 13.0 - 17.0 g/dL   HCT 28.7 (L) 39.0 - 52.0 %   MCV 86.4 78.0 - 100.0 fL   MCH 25.6 (L) 26.0 - 34.0 pg   MCHC 29.6 (L) 30.0 - 36.0 g/dL   RDW 17.8 (H) 11.5 - 15.5 %   Platelets 135 (L) 150 - 400 K/uL  Basic metabolic panel     Status: Abnormal   Collection Time: 09/28/15  4:15 AM  Result Value Ref Range   Sodium 142 135 - 145 mmol/L   Potassium 4.2 3.5 - 5.1 mmol/L   Chloride 109 101 - 111 mmol/L   CO2 29 22 - 32 mmol/L   Glucose, Bld 83 65 - 99 mg/dL   BUN 9 6 - 20 mg/dL   Creatinine, Ser 0.91  0.61 - 1.24 mg/dL   Calcium 8.8 (L) 8.9 - 10.3 mg/dL   GFR calc non Af Amer >60 >60 mL/min   GFR calc Af Amer >60 >60 mL/min    Comment: (NOTE) The eGFR has been calculated using the CKD EPI equation. This calculation has not been validated in all clinical situations. eGFR's persistently <60 mL/min signify possible Chronic Kidney Disease.    Anion gap 4 (L) 5 - 15     HEENT: normal Cardio: LVAD hum Resp: CTA B/L and unlabored GI: NT,  ND Extremity:  Pulses positive and No Edema Skin:   Breakdown sacral gr 2 Neuro: Confused and Abnormal Motor 4/5 in BUE and BLE Musc/Skel:  Other no pain with UE or LE ROM Gen NAD   Assessment/Plan: 1. Functional deficits secondary to deconditioning and encephalopathy which require 3+ hours per day of interdisciplinary therapy in a comprehensive inpatient rehab setting. Physiatrist is providing close team supervision and 24 hour management of active medical problems listed below. Physiatrist and rehab team continue to assess barriers to discharge/monitor patient progress toward functional and medical goals. FIM: Function - Bathing Position: Wheelchair/chair at sink Body parts bathed by patient: Right arm, Left arm, Chest, Front perineal area, Right upper leg, Left upper leg, Abdomen, Buttocks, Right lower leg, Left lower leg Body parts bathed by helper: Back Bathing not applicable: Left lower leg, Right lower leg Assist Level: Supervision or verbal cues  Function- Upper Body Dressing/Undressing What is the patient wearing?: Pull over shirt/dress Pull over shirt/dress - Perfomed by patient: Thread/unthread right sleeve, Thread/unthread left sleeve, Pull shirt over trunk, Put head through opening Pull over shirt/dress - Perfomed by helper: Put head through opening, Thread/unthread right sleeve, Thread/unthread left sleeve, Pull shirt over trunk Assist Level: Touching or steadying assistance(Pt > 75%) (assist to orient to shirt, problem solve) Set up : To obtain clothing/put away Function - Lower Body Dressing/Undressing What is the patient wearing?: Pants Position:  (Standing from toilet with RW) Underwear - Performed by patient: Thread/unthread right underwear leg, Pull underwear up/down Underwear - Performed by helper: Thread/unthread left underwear leg Pants- Performed by patient: Pull pants up/down Pants- Performed by helper: Thread/unthread right pants leg, Thread/unthread left  pants leg Non-skid slipper socks- Performed by patient: Don/doff right sock, Don/doff left sock Non-skid slipper socks- Performed by helper: Don/doff right sock, Don/doff left sock Shoes - Performed by patient: Don/doff right shoe, Don/doff left shoe TED Hose - Performed by helper: Don/doff right TED hose, Don/doff left TED hose Assist for footwear: Partial/moderate assist Assist for lower body dressing: Touching or steadying assistance (Pt > 75%) (Mod assist sit > stand)  Function - Toileting Toileting steps completed by patient: Adjust clothing after toileting Toileting steps completed by helper: Adjust clothing prior to toileting, Performs perineal hygiene Toileting Assistive Devices: Grab bar or rail Assist level: Touching or steadying assistance (Pt.75%)  Function - Air cabin crew transfer assistive device: Grab bar Assist level to toilet: Touching or steadying assistance (Pt > 75%) Assist level from toilet: Touching or steadying assistance (Pt > 75%) Assist level to bedside commode (at bedside): Moderate assist (Pt 50 - 74%/lift or lower) Assist level from bedside commode (at bedside): Moderate assist (Pt 50 - 74%/lift or lower)  Function - Chair/bed transfer Chair/bed transfer method: Ambulatory Chair/bed transfer assist level: Supervision or verbal cues Chair/bed transfer assistive device: Walker Chair/bed transfer details: Verbal cues for technique, Verbal cues for precautions/safety  Function - Locomotion: Wheelchair Type: Manual (bil LE propulsion) Wheelchair activity  did not occur: Safety/medical concerns (sternal precautions) Max wheelchair distance: 150 Assist Level: Supervision or verbal cues Wheel 50 feet with 2 turns activity did not occur: Safety/medical concerns Assist Level: Supervision or verbal cues Wheel 150 feet activity did not occur: Safety/medical concerns Assist Level: Supervision or verbal cues Turns around,maneuvers to table,bed, and  toilet,negotiates 3% grade,maneuvers on rugs and over doorsills: No Function - Locomotion: Ambulation Assistive device: Walker-rolling Max distance: 175 Assist level: Supervision or verbal cues Assist level: Supervision or verbal cues Walk 50 feet with 2 turns activity did not occur: Safety/medical concerns Assist level: Supervision or verbal cues Walk 150 feet activity did not occur: Safety/medical concerns Assist level: Supervision or verbal cues Walk 10 feet on uneven surfaces activity did not occur: Safety/medical concerns  Function - Comprehension Comprehension: Auditory Comprehension assist level: Understands basic 90% of the time/cues < 10% of the time  Function - Expression Expression: Verbal Expression assist level: Expresses basic 90% of the time/requires cueing < 10% of the time.  Function - Social Interaction Social Interaction assist level: Interacts appropriately 75 - 89% of the time - Needs redirection for appropriate language or to initiate interaction.  Function - Problem Solving Problem solving assist level: Solves basic 75 - 89% of the time/requires cueing 10 - 24% of the time  Function - Memory Memory assist level: Recognizes or recalls 50 - 74% of the time/requires cueing 25 - 49% of the time Patient normally able to recall (first 3 days only): Current season, That he or she is in a hospital  1. Debilitation secondary to acute on chronic systolic congestive heart failure/recent LVAD 03/17/3233 complicated by left pleural effusion with chest tube Continue CIR PT, OT, SLP Planned d/c 7/26 for supervision level.   2. DVT Prophylaxis/Anticoagulation: Chronic Coumadin therapy. Monitor for any bleeding episodes 3. Pain Management: Ultram as needed 4. Mood: Remeron increase to 30 mg daily at bedtime, Zyprexa d/ced 5. Neuropsych: This patient is capable of making decisions on his own behalf. 6. Skin/Wound Care: Routine skin checks 7. Fluids/Electrolytes/Nutrition:  Routine I&O's with follow-up chemistries 8. Acute blood loss anemia. Follow-up CBC, Hemoglobins running in the 8 range 9. Hypertension/atrial fibrillation. Amiodarone 200 mg twice a day, Aldactone 25 mg daily. Monitor with increased mobility 10. BPH. Flomax 0.4 mg daily. Check PVR 3 11. Decreased nutritional storage. Follow-up dietary consult, enc protein intake, trial beneprotein, Appreciate dietary f/u 12. Memory deficits, per wife this was not a pre-existing problem.+encephalopathy 13.  Freq urination-  oxybutnin low dose, still up 3 times, Neg UA, increase dose 14.  Constipation, hard stool, increase colace, likely needs supp today, d/w RN LOS (Days) 9 A FACE TO FACE EVALUATION WAS PERFORMED  Camrin Lapre E 09/28/2015, 8:17 AM

## 2015-09-28 NOTE — Progress Notes (Signed)
Patient ID: [name], male   DOB: 1941-02-09, 75 y.o.   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 Normal cors. EF 10% with low cardiac output.   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   Foley placed 7/3 for urinary retention. S/P CT with 1700 cc fluid out.   Ramp echo 7/10 with increase in speed to 9400 rpm. Speed decreased to 9200 09/24/15. PI events went from 22 > 7 > 2  Device adjusted to given activity-responsive a-pacing.   Continues to improve working with PT. Still struggling with appetite, family pushing him to eat more of his meals.  Tentative discharge 10/02/15.    Labs stable.  Weight still downtrending.  MAP in 80s.     LVAD INTERROGATION:  HeartMate II LVAD:  Flow 4.8 liters/min, speed 9200, power 5.1, PI 4.0 (standing)  Objective:    Vital Signs:   Temp:  [98 F (36.7 C)-98.3 F (36.8 C)] 98 F (36.7 C) (07/22 0520) Pulse Rate:  [60-67] 67 (07/22 0520) Resp:  [17-18] 17 (07/22 0520) SpO2:  [100 %] 100 % (07/22 0520) Last BM Date: 09/27/15 Mean arterial Pressure 80s  Intake/Output:   Intake/Output Summary (Last 24 hours) at 09/28/15 1038 Last data filed at 09/28/15 0023  Gross per 24 hour  Intake    360 ml  Output    800 ml  Net   -440 ml     Physical Exam: General: In the chair, resting. NAD.  HEENT: normal  Neck: supple. JVP stable ~6-7 cm Cor: LVAD hum.  Lungs: Clear. Normal effort Abdomen: soft, NT, ND, no HSM. No bruits or masses. +BS  Driveline site ok. Securement device in place. Extremities: no cyanosis, clubbing, rash.  trace ankle edema. TED hose in place.  Neuro: Alert and oriented to person and place  Labs: Basic Metabolic Panel:  Recent Labs Lab 09/24/15 0445 09/25/15 0445 09/26/15 1225 09/27/15 0355 09/28/15 0415  NA 141 140 139 141 142  K 3.9 4.0 3.9 4.4 4.2  CL 108 106 108 108 109  CO2 28 28 25 28 29   GLUCOSE 87 87  143* 94 83  BUN 10 12 12 11 9   CREATININE 0.82 0.90 1.00 0.87 0.91  CALCIUM 8.7* 8.6* 8.7* 8.5* 8.8*  MG  --   --  2.3  --   --     Liver Function Tests: No results for input(s): AST, ALT, ALKPHOS, BILITOT, PROT, ALBUMIN in the last 168 hours. No results for input(s): LIPASE, AMYLASE in the last 168 hours. No results for input(s): AMMONIA in the last 168 hours.  CBC:  Recent Labs Lab 09/22/15 0417 09/25/15 0445 09/26/15 0420 09/27/15 0355 09/28/15 0415  WBC 3.9* 3.7* 3.5* 3.9* 3.5*  HGB 8.3* 8.4* 8.3* 8.8* 8.5*  HCT 27.1* 27.6* 28.7* 30.1* 28.7*  MCV 85.5 85.2 86.2 85.8 86.4  PLT 158 144* 135* 141* 135*    INR:  Recent Labs Lab 09/24/15 0445 09/25/15 0445 09/26/15 0420 09/27/15 0355 09/28/15 0415  INR 1.92* 2.00* 2.10* 2.02* 2.44*    Other results:  EKG:   Imaging: No results found.   Medications:     Scheduled Medications: . amiodarone  200 mg Oral BID  . aspirin EC  81 mg Oral Daily  . bisacodyl  10 mg Oral Daily   Or  . bisacodyl  10 mg Rectal Daily  . docusate sodium  200 mg Oral BID  . feeding supplement (ENSURE ENLIVE)  237 mL Oral BID BM  . ferrous fumarate-b12-vitamic C-folic acid  1 capsule Oral TID PC  . mirtazapine  30 mg Oral QHS  . oxybutynin  5 mg Oral QHS  . pantoprazole  40 mg Oral Daily  . polyethylene glycol  17 g Oral Daily  . protein supplement  1 scoop Oral TID WC  . sodium chloride flush  10-40 mL Intracatheter Q12H  . tamsulosin  0.4 mg Oral Daily  . warfarin  2.5 mg Oral q1800  . Warfarin - Pharmacist Dosing Inpatient   Does not apply q1800    Infusions:    PRN Medications: acetaminophen, ondansetron **OR** ondansetron (ZOFRAN) IV, polyvinyl alcohol, sodium chloride flush, sorbitol, traMADol   Assessment:   1. 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 EF 15%. Cath 8/14 with normal cors. 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  2. Frequent PVCs 3. Severe malnutrition- Prealbumin 14.7 on  6/20 4. Acute post-op delirium/sundowning 5. Urinary Retention requiring Foley 6. Left pleural effusion s/p chest tube  Plan/Discussion:    Continue working with PT and pushing oral intake. Appetite waxes and wanes.   Pharmacy dosing coumadin, goal INR 2-2.5.  INR 2.44 today.  MAP stable in 80s.   Pt still has PICC line in for blood work. Will consider removing in next day or two.   Isolated power spike noted (10.4) week of 09/18/15. Event log was reviewed thought to be pump burn in.  LDH stable.   PI had been dropping considerably with standing.  This is now improved.  I checked him today while standing and PI remained about 4.    Appreciate CIR and input. Continue therapy. We will follow along with recommendations.   I reviewed the LVAD parameters from today, and compared the results to the patient's prior recorded data.  No programming changes were made.  The LVAD is functioning within specified parameters.  The patient performs LVAD self-test daily.  LVAD interrogation was negative for any significant power changes, alarms or PI events/speed drops.  LVAD equipment check completed and is in good working order.  Back-up equipment present.   LVAD education done on emergency procedures and precautions and reviewed exit site care.  Length of Stay: 9  Yesmin Mutch Shirlee Latch 09/28/2015, 10:38 AM  VAD Team --- VAD ISSUES ONLY--- Pager [phone] (7am - 7am)  Advanced Heart Failure Team  Pager [phone] (M-F; 7a - 4p)  Please contact [facility] for night-coverage after hours (4p -7a ) and weekends on [email or web address]

## 2015-09-28 NOTE — Progress Notes (Signed)
ANTICOAGULATION CONSULT NOTE - Follow Up Consult  Pharmacy Consult for Coumadin Indication: LVAD  Allergies  Allergen Reactions  . Phenergan [Promethazine Hcl] Nausea And Vomiting  . Lasix [Furosemide] Other (See Comments)    "Dropped blood pressure too low"  . Morphine And Related Other (See Comments)    Goes crazy     Patient Measurements: Weight: 161 lb 9.6 oz (73.3 kg)  Vital Signs: Temp: 98 F (36.7 C) (07/22 0520) Temp Source: Oral (07/22 0520) Pulse Rate: 67 (07/22 0520)  Labs:  Recent Labs  09/26/15 0420 09/26/15 1225 09/27/15 0355 09/28/15 0415  HGB 8.3*  --  8.8* 8.5*  HCT 28.7*  --  30.1* 28.7*  PLT 135*  --  141* 135*  LABPROT 23.4*  --  22.8* 26.2*  INR 2.10*  --  2.02* 2.44*  CREATININE  --  1.00 0.87 0.91    Estimated Creatinine Clearance: 75 mL/min (by C-G formula based on Cr of 0.91).  Assessment: 37 yom s/p LVAD 6/23 continues on coumadin (started low dose per MD on 6/24, pharmacy dosing starting 7/3). INR is therapeutic today at 2.44, rising a bit after 3 mg x several days. He continues on po amiodarone. CBC stable. No bleeding.   Goal of Therapy:  INR 2-2.5 Monitor platelets by anticoagulation protocol: yes   Plan:  1) Reduce Coumadin to 2.5 mg daily for now.   2) Daily INR  Tad Moore, BCPS  Clinical Pharmacist Pager [phone]  09/28/2015 8:06 AM

## 2015-09-29 ENCOUNTER — Inpatient Hospital Stay (HOSPITAL_COMMUNITY): Payer: Medicare Other | Admitting: Physical Therapy

## 2015-09-29 LAB — BASIC METABOLIC PANEL
Anion gap: 4 — ABNORMAL LOW (ref 5–15)
BUN: 9 mg/dL (ref 6–20)
CHLORIDE: 108 mmol/L (ref 101–111)
CO2: 29 mmol/L (ref 22–32)
Calcium: 8.6 mg/dL — ABNORMAL LOW (ref 8.9–10.3)
Creatinine, Ser: 0.87 mg/dL (ref 0.61–1.24)
GFR calc Af Amer: >60 mL/min (ref >60)
GLUCOSE: 88 mg/dL (ref 65–99)
POTASSIUM: 4 mmol/L (ref 3.5–5.1)
Sodium: 141 mmol/L (ref 135–145)

## 2015-09-29 LAB — CBC
HCT: 27.1 % — ABNORMAL LOW (ref 39.0–52.0)
Hemoglobin: 8.2 g/dL — ABNORMAL LOW (ref 13.0–17.0)
MCH: 25.9 pg — AB (ref 26.0–34.0)
MCHC: 30.3 g/dL (ref 30.0–36.0)
MCV: 85.8 fL (ref 78.0–100.0)
PLATELETS: 114 10*3/uL — AB (ref 150–400)
RBC: 3.16 MIL/uL — AB (ref 4.22–5.81)
RDW: 18 % — ABNORMAL HIGH (ref 11.5–15.5)
WBC: 3.3 10*3/uL — ABNORMAL LOW (ref 4.0–10.5)

## 2015-09-29 LAB — LACTATE DEHYDROGENASE: LDH: 185 U/L (ref 98–192)

## 2015-09-29 LAB — PROTIME-INR
INR: 2.63 — ABNORMAL HIGH (ref 0.00–1.49)
PROTHROMBIN TIME: 27.7 s — AB (ref 11.6–15.2)

## 2015-09-29 MED ORDER — WARFARIN SODIUM 1 MG PO TABS
1.0000 mg | ORAL_TABLET | Freq: Once | ORAL | Status: AC
  Administered 2015-09-29: 1 mg via ORAL
  Filled 2015-09-29: qty 1

## 2015-09-29 MED ORDER — PSYLLIUM 95 % PO PACK
1.0000 | PACK | Freq: Two times a day (BID) | ORAL | Status: DC
  Administered 2015-09-29 – 2015-10-01 (×5): 1 via ORAL
  Filled 2015-09-29 (×5): qty 1

## 2015-09-29 NOTE — Progress Notes (Signed)
ANTICOAGULATION CONSULT NOTE - Follow Up Consult  Pharmacy Consult for Coumadin Indication: LVAD  Allergies  Allergen Reactions  . Phenergan [Promethazine Hcl] Nausea And Vomiting  . Lasix [Furosemide] Other (See Comments)    "Dropped blood pressure too low"  . Morphine And Related Other (See Comments)    Goes crazy     Patient Measurements: Weight: 164 lb 14.5 oz (74.8 kg)  Vital Signs: Temp: 99.7 F (37.6 C) (07/23 0535) Temp Source: Oral (07/23 0535) Pulse Rate: 62 (07/23 0535)  Labs:  Recent Labs  09/27/15 0355 09/28/15 0415 09/29/15 0415  HGB 8.8* 8.5* 8.2*  HCT 30.1* 28.7* 27.1*  PLT 141* 135* 114*  LABPROT 22.8* 26.2* 27.7*  INR 2.02* 2.44* 2.63*  CREATININE 0.87 0.91 0.87    Estimated Creatinine Clearance: 80 mL/min (by C-G formula based on SCr of 0.87 mg/dL).  Assessment: 25 yom s/p LVAD 6/23 continues on coumadin (started low dose per MD on 6/24, pharmacy dosing starting 7/3). INR is slightly above goal today at 2.63, rising a bit after 3 mg x several days. He continues on po amiodarone. CBC stable. No bleeding. Appetite waxing and waning  Goal of Therapy:  INR 2-2.5 Monitor platelets by anticoagulation protocol: yes   Plan:  1) Coumadin 1 mg x 1, then maybe try 2 mg daily.  2) Daily INR  Tad Moore, BCPS  Clinical Pharmacist Pager [phone]  09/29/2015 7:43 AM

## 2015-09-29 NOTE — Progress Notes (Signed)
Patient ID: [name], male   DOB: 02-06-1941, 75 y.o.   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 Normal cors. EF 10% with low cardiac output.   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   Foley placed 7/3 for urinary retention. S/P CT with 1700 cc fluid out.   Ramp echo 7/10 with increase in speed to 9400 rpm. Speed decreased to 9200 09/24/15. PI events went from 22 > 7 > 2  Device adjusted to given activity-responsive a-pacing.   Continues to improve working with PT. Still struggling with appetite, family pushing him to eat more of his meals.  Tentative discharge 10/02/15.    Labs stable.  Weight stable.  MAP in 80s.     LVAD INTERROGATION:  HeartMate II LVAD:  Flow 4.4 liters/min, speed 9200, power 5.2, PI 5.4.  No PI events.   Objective:    Vital Signs:   Temp:  [97.7 F (36.5 C)-99.7 F (37.6 C)] 99.7 F (37.6 C) (07/23 0535) Pulse Rate:  [59-62] 62 (07/23 0535) Resp:  [16-18] 18 (07/23 0535) SpO2:  [99 %-100 %] 99 % (07/23 0535) Weight:  [164 lb 14.5 oz (74.8 kg)] 164 lb 14.5 oz (74.8 kg) (07/23 0535) Last BM Date: 09/27/15 Mean arterial Pressure 80s  Intake/Output:   Intake/Output Summary (Last 24 hours) at 09/29/15 1021 Last data filed at 09/29/15 0817  Gross per 24 hour  Intake              720 ml  Output             1000 ml  Net             -280 ml     Physical Exam: General: NAD.  HEENT: normal  Neck: supple. JVP stable ~6-7 cm Cor: LVAD hum.  Lungs: Clear. Normal effort Abdomen: soft, NT, ND, no HSM. No bruits or masses. +BS  Driveline site ok. Securement device in place. Extremities: no cyanosis, clubbing, rash.  trace ankle edema. TED hose in place.  Neuro: Alert and oriented to person and place  Labs: Basic Metabolic Panel:  Recent Labs Lab 09/25/15 0445 09/26/15 1225 09/27/15 0355 09/28/15 0415 09/29/15 0415  NA 140 139 141 142 141   K 4.0 3.9 4.4 4.2 4.0  CL 106 108 108 109 108  CO2 GLUCOSE 87 143* 94 83 88  BUN CREATININE 0.90 1.00 0.87 0.91 0.87  CALCIUM 8.6* 8.7* 8.5* 8.8* 8.6*  MG  --  2.3  --   --   --     Liver Function Tests: No results for input(s): AST, ALT, ALKPHOS, BILITOT, PROT, ALBUMIN in the last 168 hours. No results for input(s): LIPASE, AMYLASE in the last 168 hours. No results for input(s): AMMONIA in the last 168 hours.  CBC:  Recent Labs Lab 09/25/15 0445 09/26/15 0420 09/27/15 0355 09/28/15 0415 09/29/15 0415  WBC 3.7* 3.5* 3.9* 3.5* 3.3*  HGB 8.4* 8.3* 8.8* 8.5* 8.2*  HCT 27.6* 28.7* 30.1* 28.7* 27.1*  MCV 85.2 86.2 85.8 86.4 85.8  PLT 144* 135* 141* 135* 114*    INR:  Recent Labs Lab 09/25/15 0445 09/26/15 0420 09/27/15 0355 09/28/15 0415 09/29/15 0415  INR 2.00* 2.10* 2.02* 2.44* 2.63*    Other results:  EKG:   Imaging: No results found.   Medications:  Scheduled Medications: . amiodarone  200 mg Oral BID  . aspirin EC  81 mg Oral Daily  . bisacodyl  10 mg Oral Daily   Or  . bisacodyl  10 mg Rectal Daily  . docusate sodium  200 mg Oral BID  . feeding supplement (ENSURE ENLIVE)  237 mL Oral BID BM  . ferrous fumarate-b12-vitamic C-folic acid  1 capsule Oral TID PC  . mirtazapine  30 mg Oral QHS  . oxybutynin  5 mg Oral QHS  . pantoprazole  40 mg Oral Daily  . polyethylene glycol  17 g Oral Daily  . protein supplement  1 scoop Oral TID WC  . sodium chloride flush  10-40 mL Intracatheter Q12H  . tamsulosin  0.4 mg Oral Daily  . warfarin  1 mg Oral ONCE-1800  . Warfarin - Pharmacist Dosing Inpatient   Does not apply q1800    Infusions:    PRN Medications: acetaminophen, ondansetron **OR** ondansetron (ZOFRAN) IV, polyvinyl alcohol, sodium chloride flush, sorbitol, traMADol   Assessment:   1. 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 EF 15%. Cath 8/14 with normal cors. - HM II  LVAD placed 6/23.  2. Frequent PVCs 3. Severe malnutrition- Prealbumin 14.7 on 6/20 4. Acute post-op delirium/sundowning 5. Urinary Retention requiring Foley 6. Left pleural effusion s/p chest tube  Plan/Discussion:    Continue working with PT and pushing oral intake. Appetite waxes and wanes.   Pharmacy dosing coumadin, goal INR 2-2.5.  INR 2.6 today. Hemoglobin a little lower, no overt bleeding.   MAP stable in 80s.   Pt still has PICC line in for blood work. Will consider removing in next day or two.   Isolated power spike noted (10.4) week of 09/18/15. Event log was reviewed thought to be pump burn in.  LDH stable.   PI had been dropping considerably with standing.  This is now improved.    Appreciate CIR and input. Continue therapy. We will follow along with recommendations.   I reviewed the LVAD parameters from today, and compared the results to the patient's prior recorded data.  No programming changes were made.  The LVAD is functioning within specified parameters.  The patient performs LVAD self-test daily.  LVAD interrogation was negative for any significant power changes, alarms or PI events/speed drops.  LVAD equipment check completed and is in good working order.  Back-up equipment present.   LVAD education done on emergency procedures and precautions and reviewed exit site care.  Length of Stay: 10  Marca Ancona 09/29/2015, 10:21 AM  VAD Team --- VAD ISSUES ONLY--- Pager [phone] (7am - 7am)  Advanced Heart Failure Team  Pager [phone] (M-F; 7a - 4p)  Please contact [facility] for night-coverage after hours (4p -7a ) and weekends on [email or web address]

## 2015-09-29 NOTE — Progress Notes (Signed)
Subjective/Complaints: Decreased nocturia, able to fall back asleep after one nocturnal void   ROS- no CP, SOB, N/V/D  Objective: Vital Signs: Blood pressure (!) 74/55, pulse 62, temperature 99.7 F (37.6 C), temperature source Oral, resp. rate 18, weight 74.8 kg (164 lb 14.5 oz), SpO2 99 %. No results found. Results for orders placed or performed during the hospital encounter of 09/19/15 (from the past 72 hour(s))  Basic metabolic panel     Status: Abnormal   Collection Time: 09/26/15 12:25 PM  Result Value Ref Range   Sodium 139 135 - 145 mmol/L   Potassium 3.9 3.5 - 5.1 mmol/L   Chloride 108 101 - 111 mmol/L   CO2 25 22 - 32 mmol/L   Glucose, Bld 143 (H) 65 - 99 mg/dL   BUN 12 6 - 20 mg/dL   Creatinine, Ser 1.00 0.61 - 1.24 mg/dL   Calcium 8.7 (L) 8.9 - 10.3 mg/dL   GFR calc non Af Amer >60 >60 mL/min   GFR calc Af Amer >60 >60 mL/min    Comment: (NOTE) The eGFR has been calculated using the CKD EPI equation. This calculation has not been validated in all clinical situations. eGFR's persistently <60 mL/min signify possible Chronic Kidney Disease.    Anion gap 6 5 - 15  Magnesium     Status: None   Collection Time: 09/26/15 12:25 PM  Result Value Ref Range   Magnesium 2.3 1.7 - 2.4 mg/dL  Protime-INR     Status: Abnormal   Collection Time: 09/27/15  3:55 AM  Result Value Ref Range   Prothrombin Time 22.8 (H) 11.6 - 15.2 seconds   INR 2.02 (H) 0.00 - 1.49  Lactate dehydrogenase     Status: Abnormal   Collection Time: 09/27/15  3:55 AM  Result Value Ref Range   LDH 433 (H) 98 - 192 U/L    Comment: SPECIMEN HEMOLYZED. HEMOLYSIS MAY AFFECT INTEGRITY OF RESULTS.  CBC     Status: Abnormal   Collection Time: 09/27/15  3:55 AM  Result Value Ref Range   WBC 3.9 (L) 4.0 - 10.5 K/uL   RBC 3.51 (L) 4.22 - 5.81 MIL/uL   Hemoglobin 8.8 (L) 13.0 - 17.0 g/dL   HCT 30.1 (L) 39.0 - 52.0 %   MCV 85.8 78.0 - 100.0 fL   MCH 25.1 (L) 26.0 - 34.0 pg   MCHC 29.2 (L) 30.0 - 36.0  g/dL   RDW 17.5 (H) 11.5 - 15.5 %   Platelets 141 (L) 150 - 400 K/uL  Basic metabolic panel     Status: Abnormal   Collection Time: 09/27/15  3:55 AM  Result Value Ref Range   Sodium 141 135 - 145 mmol/L   Potassium 4.4 3.5 - 5.1 mmol/L   Chloride 108 101 - 111 mmol/L   CO2 28 22 - 32 mmol/L   Glucose, Bld 94 65 - 99 mg/dL   BUN 11 6 - 20 mg/dL   Creatinine, Ser 0.87 0.61 - 1.24 mg/dL   Calcium 8.5 (L) 8.9 - 10.3 mg/dL   GFR calc non Af Amer >60 >60 mL/min   GFR calc Af Amer >60 >60 mL/min    Comment: (NOTE) The eGFR has been calculated using the CKD EPI equation. This calculation has not been validated in all clinical situations. eGFR's persistently <60 mL/min signify possible Chronic Kidney Disease.    Anion gap 5 5 - 15  Lactate dehydrogenase     Status: Abnormal   Collection Time: 09/27/15  10:42 AM  Result Value Ref Range   LDH 214 (H) 98 - 192 U/L  Protime-INR     Status: Abnormal   Collection Time: 09/28/15  4:15 AM  Result Value Ref Range   Prothrombin Time 26.2 (H) 11.6 - 15.2 seconds   INR 2.44 (H) 0.00 - 1.49  Lactate dehydrogenase     Status: Abnormal   Collection Time: 09/28/15  4:15 AM  Result Value Ref Range   LDH 202 (H) 98 - 192 U/L  CBC     Status: Abnormal   Collection Time: 09/28/15  4:15 AM  Result Value Ref Range   WBC 3.5 (L) 4.0 - 10.5 K/uL   RBC 3.32 (L) 4.22 - 5.81 MIL/uL   Hemoglobin 8.5 (L) 13.0 - 17.0 g/dL   HCT 28.7 (L) 39.0 - 52.0 %   MCV 86.4 78.0 - 100.0 fL   MCH 25.6 (L) 26.0 - 34.0 pg   MCHC 29.6 (L) 30.0 - 36.0 g/dL   RDW 17.8 (H) 11.5 - 15.5 %   Platelets 135 (L) 150 - 400 K/uL  Basic metabolic panel     Status: Abnormal   Collection Time: 09/28/15  4:15 AM  Result Value Ref Range   Sodium 142 135 - 145 mmol/L   Potassium 4.2 3.5 - 5.1 mmol/L   Chloride 109 101 - 111 mmol/L   CO2 29 22 - 32 mmol/L   Glucose, Bld 83 65 - 99 mg/dL   BUN 9 6 - 20 mg/dL   Creatinine, Ser 0.91 0.61 - 1.24 mg/dL   Calcium 8.8 (L) 8.9 - 10.3 mg/dL    GFR calc non Af Amer >60 >60 mL/min   GFR calc Af Amer >60 >60 mL/min    Comment: (NOTE) The eGFR has been calculated using the CKD EPI equation. This calculation has not been validated in all clinical situations. eGFR's persistently <60 mL/min signify possible Chronic Kidney Disease.    Anion gap 4 (L) 5 - 15  Protime-INR     Status: Abnormal   Collection Time: 09/29/15  4:15 AM  Result Value Ref Range   Prothrombin Time 27.7 (H) 11.6 - 15.2 seconds   INR 2.63 (H) 0.00 - 1.49  Lactate dehydrogenase     Status: None   Collection Time: 09/29/15  4:15 AM  Result Value Ref Range   LDH 185 98 - 192 U/L  CBC     Status: Abnormal   Collection Time: 09/29/15  4:15 AM  Result Value Ref Range   WBC 3.3 (L) 4.0 - 10.5 K/uL   RBC 3.16 (L) 4.22 - 5.81 MIL/uL   Hemoglobin 8.2 (L) 13.0 - 17.0 g/dL   HCT 27.1 (L) 39.0 - 52.0 %   MCV 85.8 78.0 - 100.0 fL   MCH 25.9 (L) 26.0 - 34.0 pg   MCHC 30.3 30.0 - 36.0 g/dL   RDW 18.0 (H) 11.5 - 15.5 %   Platelets 114 (L) 150 - 400 K/uL    Comment: REPEATED TO VERIFY CONSISTENT WITH PREVIOUS RESULT   Basic metabolic panel     Status: Abnormal   Collection Time: 09/29/15  4:15 AM  Result Value Ref Range   Sodium 141 135 - 145 mmol/L   Potassium 4.0 3.5 - 5.1 mmol/L   Chloride 108 101 - 111 mmol/L   CO2 29 22 - 32 mmol/L   Glucose, Bld 88 65 - 99 mg/dL   BUN 9 6 - 20 mg/dL   Creatinine, Ser 0.87 0.61 -  1.24 mg/dL   Calcium 8.6 (L) 8.9 - 10.3 mg/dL   GFR calc non Af Amer >60 >60 mL/min   GFR calc Af Amer >60 >60 mL/min    Comment: (NOTE) The eGFR has been calculated using the CKD EPI equation. This calculation has not been validated in all clinical situations. eGFR's persistently <60 mL/min signify possible Chronic Kidney Disease.    Anion gap 4 (L) 5 - 15     HEENT: normal Cardio: LVAD hum Resp: CTA B/L and unlabored GI: NT, ND Extremity:  Pulses positive and No Edema Skin:   Breakdown sacral gr 2 Neuro: Confused and Abnormal Motor  4/5 in BUE and BLE Musc/Skel:  Other no pain with UE or LE ROM Gen NAD   Assessment/Plan: 1. Functional deficits secondary to deconditioning and encephalopathy which require 3+ hours per day of interdisciplinary therapy in a comprehensive inpatient rehab setting. Physiatrist is providing close team supervision and 24 hour management of active medical problems listed below. Physiatrist and rehab team continue to assess barriers to discharge/monitor patient progress toward functional and medical goals. FIM: Function - Bathing Position: Wheelchair/chair at sink Body parts bathed by patient: Right arm, Left arm, Chest, Front perineal area, Right upper leg, Left upper leg, Abdomen, Buttocks, Right lower leg, Left lower leg Body parts bathed by helper: Back Bathing not applicable: Left lower leg, Right lower leg Assist Level: Supervision or verbal cues  Function- Upper Body Dressing/Undressing What is the patient wearing?: Pull over shirt/dress Pull over shirt/dress - Perfomed by patient: Thread/unthread right sleeve, Thread/unthread left sleeve, Pull shirt over trunk, Put head through opening Pull over shirt/dress - Perfomed by helper: Put head through opening, Thread/unthread right sleeve, Thread/unthread left sleeve, Pull shirt over trunk Assist Level: Touching or steadying assistance(Pt > 75%) (assist to orient to shirt, problem solve) Set up : To obtain clothing/put away Function - Lower Body Dressing/Undressing What is the patient wearing?: Pants Position:  (Standing from toilet with RW) Underwear - Performed by patient: Thread/unthread right underwear leg, Pull underwear up/down Underwear - Performed by helper: Thread/unthread left underwear leg Pants- Performed by patient: Pull pants up/down Pants- Performed by helper: Thread/unthread right pants leg, Thread/unthread left pants leg Non-skid slipper socks- Performed by patient: Don/doff right sock, Don/doff left sock Non-skid slipper  socks- Performed by helper: Don/doff right sock, Don/doff left sock Shoes - Performed by patient: Don/doff right shoe, Don/doff left shoe TED Hose - Performed by helper: Don/doff right TED hose, Don/doff left TED hose Assist for footwear: Partial/moderate assist Assist for lower body dressing: Touching or steadying assistance (Pt > 75%) (Mod assist sit > stand)  Function - Toileting Toileting steps completed by patient: Adjust clothing after toileting Toileting steps completed by helper: Adjust clothing prior to toileting, Performs perineal hygiene Toileting Assistive Devices: Grab bar or rail Assist level: Touching or steadying assistance (Pt.75%)  Function - Air cabin crew transfer assistive device: Grab bar Assist level to toilet: Touching or steadying assistance (Pt > 75%) Assist level from toilet: Touching or steadying assistance (Pt > 75%) Assist level to bedside commode (at bedside): Moderate assist (Pt 50 - 74%/lift or lower) Assist level from bedside commode (at bedside): Moderate assist (Pt 50 - 74%/lift or lower)  Function - Chair/bed transfer Chair/bed transfer method: Ambulatory Chair/bed transfer assist level: Supervision or verbal cues Chair/bed transfer assistive device: Walker Chair/bed transfer details: Verbal cues for technique, Verbal cues for precautions/safety  Function - Locomotion: Wheelchair Type: Manual (bil LE propulsion) Wheelchair activity did not  occur: Safety/medical concerns (sternal precautions) Max wheelchair distance: 150 Assist Level: Supervision or verbal cues Wheel 50 feet with 2 turns activity did not occur: Safety/medical concerns Assist Level: Supervision or verbal cues Wheel 150 feet activity did not occur: Safety/medical concerns Assist Level: Supervision or verbal cues Turns around,maneuvers to table,bed, and toilet,negotiates 3% grade,maneuvers on rugs and over doorsills: No Function - Locomotion: Ambulation Assistive device:  Walker-rolling Max distance: 150' Assist level: Supervision or verbal cues Assist level: Supervision or verbal cues Walk 50 feet with 2 turns activity did not occur: Safety/medical concerns Assist level: Supervision or verbal cues Walk 150 feet activity did not occur: Safety/medical concerns Assist level: Touching or steadying assistance (Pt > 75%) Walk 10 feet on uneven surfaces activity did not occur: Safety/medical concerns  Function - Comprehension Comprehension: Auditory Comprehension assist level: Understands basic 90% of the time/cues < 10% of the time  Function - Expression Expression: Verbal Expression assist level: Expresses basic 90% of the time/requires cueing < 10% of the time.  Function - Social Interaction Social Interaction assist level: Interacts appropriately 75 - 89% of the time - Needs redirection for appropriate language or to initiate interaction.  Function - Problem Solving Problem solving assist level: Solves basic 75 - 89% of the time/requires cueing 10 - 24% of the time  Function - Memory Memory assist level: Recognizes or recalls 50 - 74% of the time/requires cueing 25 - 49% of the time Patient normally able to recall (first 3 days only): Current season, That he or she is in a hospital  1. Hypoxic encephalopathy and Deconditioning secondary to acute on chronic systolic congestive heart failure/recent LVAD 46/65/9935 complicated by left pleural effusion with chest tube Continue CIR PT, OT, SLP Planned d/c 7/26 for supervision level.  Grounds pass today 2. DVT Prophylaxis/Anticoagulation: Chronic Coumadin therapy. Monitor for any bleeding episodes 3. Pain Management: Ultram as needed 4. Mood: Remeron increase to 30 mg daily at bedtime,  5. Neuropsych: This patient is capable of making decisions on his own behalf. 6. Skin/Wound Care: Routine skin checks 7. Fluids/Electrolytes/Nutrition: Routine I&O's with follow-up chemistries 8. Acute blood loss anemia.  Follow-up CBC, Hemoglobins running in the 8 range 9. Hypertension/atrial fibrillation. Amiodarone 200 mg twice a day, Aldactone 25 mg daily. Monitor with increased mobility 10. BPH. Flomax 0.4 mg daily. Check PVR 3 11. Decreased nutritional storage. Follow-up dietary consult, enc protein intake, trial beneprotein, Appreciate dietary f/u 12. Memory deficits, per wife this was not a pre-existing problem.+encephalopathy, probable hypoxic, was having mental status changes prior to presentation with cardiogenic shock.  Slowly improving but still not oriented to time 13.  Freq urination-  oxybutnin low dose, still up 3 times, Neg UA, increase dose 14.  Constipation, hard stool, increase colace, likely needs supp today, d/w RN LOS (Days) 10 A FACE TO FACE EVALUATION WAS PERFORMED  Magnus Crescenzo E 09/29/2015, 6:57 AM

## 2015-09-29 NOTE — Progress Notes (Signed)
Physical Therapy Session Note  Patient Details  Name: [name] MRN: [id number] Date of Birth: [birth year]  Today's Date: 09/29/2015 PT Individual Time: 1345-1430 PT Individual Time Calculation (min): 45 min    Short Term Goals: Week 2:  PT Short Term Goal 1 (Week 2): =LTG due to estimated LOS, S overall  Skilled Therapeutic Interventions/Progress Updates:   Pt received seated in w/c with granddaughter and her husband present, denies pain and agreeable to treatment. Pt ambulated to gym with RW and S with min cues to maintain RW in front of body. Stairs 2x12 with light UE support on rails, reciprocal pattern up/down. Gait in hallway with RW and S overall with numbered targets placed around hall at various heights including on the floor. Pt cued to locate numbers in order. Pt required approximately 15 min to locate targets #1-12 with mod verbal cues to locate numbers, recall which number he was on, recall where he had seen a number previously. While ambulating in hall, cues to maintain RW level on the floor especially while turning. Educated family members on goal of activity for activity tolerance, RW management, attention, memory, visual scanning. Returned to room with gait S x170; remained seated in recliner at end of session, all needs in reach.   Therapy Documentation Precautions:  Precautions Precautions: Sternal Precaution Comments: LVAD, Call VAD Pager [phone]. Flow less than 2.5, or sustained increase or decrease of 2.0 L Restrictions Weight Bearing Restrictions: No RUE Weight Bearing: Non weight bearing LUE Weight Bearing: Non weight bearing Pain: Pain Assessment Pain Assessment: No/denies pain   See Function Navigator for Current Functional Status.   Therapy/Group: Individual Therapy  [name] 09/29/2015, 3:24 PM

## 2015-09-29 NOTE — Plan of Care (Signed)
Problem: RH BOWEL ELIMINATION Goal: RH STG MANAGE BOWEL WITH ASSISTANCE STG Manage Bowel with min Assistance.   Outcome: Not Progressing Last BM 09/27/15, disimpaction

## 2015-09-30 ENCOUNTER — Inpatient Hospital Stay (HOSPITAL_COMMUNITY): Payer: Medicare Other | Admitting: Physical Therapy

## 2015-09-30 ENCOUNTER — Inpatient Hospital Stay (HOSPITAL_COMMUNITY): Payer: Medicare Other | Admitting: Speech Pathology

## 2015-09-30 ENCOUNTER — Inpatient Hospital Stay (HOSPITAL_COMMUNITY): Payer: Medicare Other | Admitting: Occupational Therapy

## 2015-09-30 ENCOUNTER — Encounter (HOSPITAL_COMMUNITY): Payer: Medicare Other

## 2015-09-30 LAB — CBC
HEMATOCRIT: 28.4 % — AB (ref 39.0–52.0)
HEMOGLOBIN: 8.4 g/dL — AB (ref 13.0–17.0)
MCH: 25.4 pg — AB (ref 26.0–34.0)
MCHC: 29.6 g/dL — AB (ref 30.0–36.0)
MCV: 85.8 fL (ref 78.0–100.0)
Platelets: 112 10*3/uL — ABNORMAL LOW (ref 150–400)
RBC: 3.31 MIL/uL — ABNORMAL LOW (ref 4.22–5.81)
RDW: 18.5 % — ABNORMAL HIGH (ref 11.5–15.5)
WBC: 3.5 10*3/uL — ABNORMAL LOW (ref 4.0–10.5)

## 2015-09-30 LAB — LACTATE DEHYDROGENASE: LDH: 214 U/L — ABNORMAL HIGH (ref 98–192)

## 2015-09-30 LAB — BASIC METABOLIC PANEL
Anion gap: 4 — ABNORMAL LOW (ref 5–15)
BUN: 12 mg/dL (ref 6–20)
CHLORIDE: 107 mmol/L (ref 101–111)
CO2: 29 mmol/L (ref 22–32)
Calcium: 8.5 mg/dL — ABNORMAL LOW (ref 8.9–10.3)
Creatinine, Ser: 0.98 mg/dL (ref 0.61–1.24)
GFR calc Af Amer: >60 mL/min (ref >60)
GFR calc non Af Amer: >60 mL/min (ref >60)
GLUCOSE: 98 mg/dL (ref 65–99)
Potassium: 4 mmol/L (ref 3.5–5.1)
SODIUM: 140 mmol/L (ref 135–145)

## 2015-09-30 LAB — PROTIME-INR
INR: 2.5 — AB (ref 0.00–1.49)
Prothrombin Time: 26.7 seconds — ABNORMAL HIGH (ref 11.6–15.2)

## 2015-09-30 MED ORDER — WARFARIN SODIUM 2.5 MG PO TABS
2.5000 mg | ORAL_TABLET | Freq: Once | ORAL | Status: AC
  Administered 2015-09-30: 2.5 mg via ORAL
  Filled 2015-09-30: qty 1

## 2015-09-30 NOTE — Progress Notes (Signed)
Physical Therapy Session Note  Patient Details  Name: LENNYX GAAL MRN: [id number] Date of Birth: 11-15-1940  Today's Date: 09/30/2015 PT Individual Time: 1100-1205 PT Individual Time Calculation (min): 65 min    Short Term Goals: Week 2:  PT Short Term Goal 1 (Week 2): =LTG due to estimated LOS, S overall  Skilled Therapeutic Interventions/Progress Updates:   Pt received seated in recliner, very fatigued however agreeable to treatment. While pt waking up, discussed with pt and wife home health vs outpatient therapy, and home modifications to raise chair height to reduce pt's instinct to use UEs to stand. Gait with rollator 2x250' with S; significantly improved management with rollator compared to RW, with pt maintaining rollator close to body while turning to sit. Furniture transfer from low couch with minA due to low height. Car transfer with S and rollator. Standing activity tolerance while engaged in [employer]; requires max cues for problem solving to find a word that began with the letter rolled on the dice. Returned to room with S. Remained seated in recliner with wife present, all needs in reach.   Therapy Documentation Precautions:  Precautions Precautions: Sternal Precaution Comments: LVAD, Call VAD Pager [phone]. Flow less than 2.5, or sustained increase or decrease of 2.0 L Restrictions Weight Bearing Restrictions: No RUE Weight Bearing: Non weight bearing LUE Weight Bearing: Non weight bearing RLE Weight Bearing: Weight bearing as tolerated LLE Weight Bearing: Weight bearing as tolerated Pain: Pain Assessment Pain Assessment: No/denies pain   See Function Navigator for Current Functional Status.   Therapy/Group: Individual Therapy  [name] 09/30/2015, 12:17 PM

## 2015-09-30 NOTE — Progress Notes (Signed)
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 Implant hospitalization was from 08/22/15 - 09/19/15.   Vital signs: Temo: 97.6 HR: 60 Doppler MAP: 66  Wt in lbs:  179 > 170 > 168 > 167 > 161 > 157 > 165 > 165 > 161 > 166lbs   LVAD interrogation reveals:  Speed: 9200 Flow:4.2 Power: 5.2w PI: 4.5 Alarms: none Events: 0 PI events  Fixed speed:  9200 Low speed limit: 8600  Drive Line:  C/D/I. Daily dressing changes using guaze dressing with Aquacel silver strip on exit site being maintained per wife. Dressing supplies at bedside. Wife is requesting that I observe her change the dressing today. We will try to accommodate this.   Labs:  LDH trend:  196 > 266 > 188 > 190 > 182 > 181 > 214   INR trend: 2.12 > 2.47 > 3.0 > 2.95 > 2.45 > 2.0 > 1.0 > 2.02 > 2.5  Hgb:  8.5 > 9.0 > 8.3 > 8.4 > 8.3 > 8.8 > 8.4  Antithrombotic Management:   INR goal: 2.0 - 2.5 ASA 81 mg      Plan/Recommendations:  1. Pt performing well in rehab, tentative d/c Wednesday.  2. Less drop in PI today with orthostatic changes; baseline PI 6.6 lying; 5.1 with standing.  3. Pt c/o increased pain from sacral skin breakdown. Nurse aware, current plan Mepilex dressing during day, barrier cream at night.  4. Reviewed with the wife the pager system and how to page the VAD coordinator. Also, reviewed emergency procedures at the wife's request.   Carlton Adam, RN VAD Coordinator  Office: [phone] 24/7 Emergency VAD Pager: [phone]

## 2015-09-30 NOTE — Progress Notes (Signed)
Speech Language Pathology Daily Session Note  Patient Details  Name: [name] MRN: [id number] Date of Birth: 1940/07/30  Today's Date: 09/30/2015 SLP Individual Time: 6773-7366 SLP Individual Time Calculation (min): 33 min   Short Term Goals: Week 2: SLP Short Term Goal 1 (Week 2): Pt will sustain his attention to a basic, familiar task for 10 minutes with min verbal cues for redirection.  SLP Short Term Goal 2 (Week 2): Pt will complete a basic, familiar task with min assist verbal cues for functional problem solving.   SLP Short Term Goal 3 (Week 2): Pt will utilize external aids to orient to date and situation with supervision verbal cues.   SLP Short Term Goal 4 (Week 2): Pt will return demonstration of how to connect and disconnect from battery power of LVAD with min assist verbal and tactile cues.   Skilled Therapeutic Interventions: Skilled treatment session focused on cognitive goals. Patient received from OT. SLP facilitated session by providing Mod-Max A question cues for utilization of external memory aids for orientation to date, however, patient was independently oriented to place and situation. Patient also participated in a basic money management task with Mod A verbal cues for problem solving. Patient ambulated back to his room and required Mod A verbal cues navigation. Patient left upright in recliner with all needs within reach. Continue with current plan of care.   Function:   Cognition Comprehension Comprehension assist level: Understands basic 75 - 89% of the time/ requires cueing 10 - 24% of the time  Expression   Expression assist level: Expresses basic 90% of the time/requires cueing < 10% of the time.  Social Interaction Social Interaction assist level: Interacts appropriately 75 - 89% of the time - Needs redirection for appropriate language or to initiate interaction.  Problem Solving Problem solving assist level: Solves basic 25 - 49% of the time - needs  direction more than half the time to initiate, plan or complete simple activities  Memory Memory assist level: Recognizes or recalls 25 - 49% of the time/requires cueing 50 - 75% of the time    Pain Pain Assessment Pain Assessment: No/denies pain  Therapy/Group: Individual Therapy  Emillia Weatherly 09/30/2015, 3:38 PM

## 2015-09-30 NOTE — Progress Notes (Signed)
ANTICOAGULATION CONSULT NOTE - Follow Up Consult  Pharmacy Consult for Coumadin Indication: LVAD  Allergies  Allergen Reactions  . Phenergan [Promethazine Hcl] Nausea And Vomiting  . Lasix [Furosemide] Other (See Comments)    "Dropped blood pressure too low"  . Morphine And Related Other (See Comments)    Goes crazy     Patient Measurements: Weight: 166 lb 14.4 oz (75.7 kg)  Vital Signs:    Labs:  Recent Labs  09/28/15 0415 09/29/15 0415 09/30/15 0540  HGB 8.5* 8.2* 8.4*  HCT 28.7* 27.1* 28.4*  PLT 135* 114* 112*  LABPROT 26.2* 27.7* 26.7*  INR 2.44* 2.63* 2.50*  CREATININE 0.91 0.87 0.98    Estimated Creatinine Clearance: 71.9 mL/min (by C-G formula based on SCr of 0.98 mg/dL).  Assessment: 11 yom s/p LVAD 6/23 continues on coumadin (started low dose per MD on 6/24, pharmacy dosing starting 7/3). INR is therapeutic today. He continues on po amiodarone. CBC stable. No bleeding. Appetite waxing and waning  Goal of Therapy:  INR 2-2.5 Monitor platelets by anticoagulation protocol: yes   Plan:  1) Coumadin 2.5 mg x 1, then maybe try 2.5 mg daily? 2) Daily INR  Tad Moore, BCPS  Clinical Pharmacist Pager [phone]  09/30/2015 3:13 PM

## 2015-09-30 NOTE — Progress Notes (Signed)
Patient ID: [name], male   DOB: Oct 31, 1940, 75 y.o.   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   Ramp echo 7/10 with increase in speed to 9400 rpm. Speed decreased to 9200 09/24/15. PI events went from 22 > 7 > 2  Getting stronger. Confusion resolved. Continues to make progress with rehab staff. PI drops when lying on his side. No dizziness.   Tentative discharge 10/02/15.    LVAD INTERROGATION:  HeartMate II LVAD:  Flow 4.9 liters/min, speed 9200, power 6, PI 5.4.  < 10 PI events daily.    Objective:    Vital Signs:   Temp:  [97.6 F (36.4 C)] 97.6 F (36.4 C) (07/23 1435) Pulse Rate:  [62] 62 (07/23 1435) Resp:  [17] 17 (07/23 1435) BP: (80)/(60) 80/60 (07/23 2200) SpO2:  [100 %] 100 % (07/23 1435) Weight:  [166 lb 14.4 oz (75.7 kg)] 166 lb 14.4 oz (75.7 kg) (07/24 0500) Last BM Date: 09/27/15 Mean arterial Pressure 80s  Intake/Output:   Intake/Output Summary (Last 24 hours) at 09/30/15 1326 Last data filed at 09/30/15 1200  Gross per 24 hour  Intake              480 ml  Output              550 ml  Net              -70 ml     Physical Exam: General: NAD. In the chair HEENT: normal  Neck: supple. JVP 5 Cor: LVAD hum.  Lungs: Clear. Normal effort  +chest tube site with sutures remaining Abdomen: soft, NT, ND, no HSM. No bruits or masses. +BS  Driveline site ok. Securement device in place. Extremities: no cyanosis, clubbing, rash.  trace ankle edema. TED hose in place.  Neuro: Alert and oriented to person and place  Labs: Basic Metabolic Panel:  Recent Labs Lab 09/26/15 1225 09/27/15 0355 09/28/15 0415 09/29/15 0415 09/30/15 0540  NA 139 141 142 141 140  K 3.9 4.4 4.2 4.0 4.0  CL 108 108 109 108 107  CO2 25 28 29 29 29   GLUCOSE 143* 94 83 88 98  BUN 12 11 9 9 12   CREATININE 1.00 0.87 0.91 0.87 0.98  CALCIUM 8.7* 8.5* 8.8* 8.6*  8.5*  MG 2.3  --   --   --   --     Liver Function Tests: No results for input(s): AST, ALT, ALKPHOS, BILITOT, PROT, ALBUMIN in the last 168 hours. No results for input(s): LIPASE, AMYLASE in the last 168 hours. No results for input(s): AMMONIA in the last 168 hours.  CBC:  Recent Labs Lab 09/26/15 0420 09/27/15 0355 09/28/15 0415 09/29/15 0415 09/30/15 0540  WBC 3.5* 3.9* 3.5* 3.3* 3.5*  HGB 8.3* 8.8* 8.5* 8.2* 8.4*  HCT 28.7* 30.1* 28.7* 27.1* 28.4*  MCV 86.2 85.8 86.4 85.8 85.8  PLT 135* 141* 135* 114* 112*    INR:  Recent Labs Lab 09/26/15 0420 09/27/15 0355 09/28/15 0415 09/29/15 0415 09/30/15 0540  INR 2.10* 2.02* 2.44* 2.63* 2.50*    Other results:  EKG:   Imaging: No results found.   Medications:     Scheduled Medications: . amiodarone  200 mg Oral BID  . aspirin EC  81 mg Oral Daily  . bisacodyl  10 mg Oral Daily   Or  .  bisacodyl  10 mg Rectal Daily  . docusate sodium  200 mg Oral BID  . feeding supplement (ENSURE ENLIVE)  237 mL Oral BID BM  . ferrous fumarate-b12-vitamic C-folic acid  1 capsule Oral TID PC  . mirtazapine  30 mg Oral QHS  . oxybutynin  5 mg Oral QHS  . pantoprazole  40 mg Oral Daily  . polyethylene glycol  17 g Oral Daily  . protein supplement  1 scoop Oral TID WC  . psyllium  1 packet Oral BID  . sodium chloride flush  10-40 mL Intracatheter Q12H  . tamsulosin  0.4 mg Oral Daily  . Warfarin - Pharmacist Dosing Inpatient   Does not apply q1800    Infusions:    PRN Medications: acetaminophen, ondansetron **OR** ondansetron (ZOFRAN) IV, polyvinyl alcohol, sodium chloride flush, sorbitol, traMADol   Assessment:   1. 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 EF 15%. Cath 8/14 with normal cors. 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  2. Frequent PVCs 3. Severe malnutrition- Prealbumin 14.7 on 6/20 4. Acute post-op delirium/sundowning 5. Urinary Retention requiring Foley 6. Left pleural  effusion s/p chest tube  Plan/Discussion:    Continue working with PT and pushing oral intake. Appetite waxes and wanes.   Pharmacy dosing coumadin, goal INR 2-2.5.  INR 2.5 today. Hemoglobin 8.4, no overt bleeding.   MAP stable in 80s.   Pt still has PICC line in for blood work. Remove PICC tomorrow.    Isolated power spike noted (10.4) week of 09/18/15. Event log was reviewed thought to be pump burn in.  LDH stable.   PI had been dropping considerably with standing.  This is now improved.    Appreciate CIR and input. Continue therapy. HF follow up has been set up.    I reviewed the LVAD parameters from today, and compared the results to the patient's prior recorded data.  No programming changes were made.  The LVAD is functioning within specified parameters.  The patient performs LVAD self-test daily.  LVAD interrogation was negative for any significant power changes, alarms or PI events/speed drops.  LVAD equipment check completed and is in good working order.  Back-up equipment present.   LVAD education done on emergency procedures and precautions and reviewed exit site care.  Length of Stay: 11  Amy Clegg NP-C 09/30/2015, 1:26 PM  VAD Team --- VAD ISSUES ONLY--- Pager [phone] (7am - 7am)  Advanced Heart Failure Team  Pager [phone] (M-F; 7a - 4p)  Please contact [facility] for night-coverage after hours (4p -7a ) and weekends on [email or web address]  Patient seen and examined with Tonye Becket, NP. We discussed all aspects of the encounter. I agree with the assessment and plan as stated above.   Improving with rehab. Confusion resolved. PI dropping with lying on side (likely positional reduction in pump flow). INR and hgb ok. VAD parameters stable.  Will remove PICC and CT sutures tomorrow. Potentially home Wednesday. We reviewed LVAD teaching.   Estie Sproule,MD 5:44 PM

## 2015-09-30 NOTE — Progress Notes (Signed)
Social Work Patient ID: [name], male   DOB: 10-17-1940, 75 y.o.   MRN: [id number]   Met with pt and wife to discuss discharge needs. Wife wants Neoma Laming Dab who I found works for [employer]. Both want referral to [facility] for follow up therapies. Referral made to [facility] for bedside commode has rollator rolling walker. Both pleased with progress he is making and feel prepared for discharge Wed.

## 2015-09-30 NOTE — Progress Notes (Signed)
Subjective/Complaints: Up just once to void last noc , no other issues noted Wife is comfortable with transfers   ROS- no CP, SOB, N/V/D  Objective: Vital Signs: Blood pressure (!) 80/60, pulse 62, temperature 97.6 F (36.4 C), temperature source Oral, resp. rate 17, weight 75.7 kg (166 lb 14.4 oz), SpO2 100 %. No results found. Results for orders placed or performed during the hospital encounter of 09/19/15 (from the past 72 hour(s))  Lactate dehydrogenase     Status: Abnormal   Collection Time: 09/27/15 10:42 AM  Result Value Ref Range   LDH 214 (H) 98 - 192 U/L  Protime-INR     Status: Abnormal   Collection Time: 09/28/15  4:15 AM  Result Value Ref Range   Prothrombin Time 26.2 (H) 11.6 - 15.2 seconds   INR 2.44 (H) 0.00 - 1.49  Lactate dehydrogenase     Status: Abnormal   Collection Time: 09/28/15  4:15 AM  Result Value Ref Range   LDH 202 (H) 98 - 192 U/L  CBC     Status: Abnormal   Collection Time: 09/28/15  4:15 AM  Result Value Ref Range   WBC 3.5 (L) 4.0 - 10.5 K/uL   RBC 3.32 (L) 4.22 - 5.81 MIL/uL   Hemoglobin 8.5 (L) 13.0 - 17.0 g/dL   HCT 28.7 (L) 39.0 - 52.0 %   MCV 86.4 78.0 - 100.0 fL   MCH 25.6 (L) 26.0 - 34.0 pg   MCHC 29.6 (L) 30.0 - 36.0 g/dL   RDW 17.8 (H) 11.5 - 15.5 %   Platelets 135 (L) 150 - 400 K/uL  Basic metabolic panel     Status: Abnormal   Collection Time: 09/28/15  4:15 AM  Result Value Ref Range   Sodium 142 135 - 145 mmol/L   Potassium 4.2 3.5 - 5.1 mmol/L   Chloride 109 101 - 111 mmol/L   CO2 29 22 - 32 mmol/L   Glucose, Bld 83 65 - 99 mg/dL   BUN 9 6 - 20 mg/dL   Creatinine, Ser 0.91 0.61 - 1.24 mg/dL   Calcium 8.8 (L) 8.9 - 10.3 mg/dL   GFR calc non Af Amer >60 >60 mL/min   GFR calc Af Amer >60 >60 mL/min    Comment: (NOTE) The eGFR has been calculated using the CKD EPI equation. This calculation has not been validated in all clinical situations. eGFR's persistently <60 mL/min signify possible Chronic Kidney Disease.    Anion gap 4 (L) 5 - 15  Protime-INR     Status: Abnormal   Collection Time: 09/29/15  4:15 AM  Result Value Ref Range   Prothrombin Time 27.7 (H) 11.6 - 15.2 seconds   INR 2.63 (H) 0.00 - 1.49  Lactate dehydrogenase     Status: None   Collection Time: 09/29/15  4:15 AM  Result Value Ref Range   LDH 185 98 - 192 U/L  CBC     Status: Abnormal   Collection Time: 09/29/15  4:15 AM  Result Value Ref Range   WBC 3.3 (L) 4.0 - 10.5 K/uL   RBC 3.16 (L) 4.22 - 5.81 MIL/uL   Hemoglobin 8.2 (L) 13.0 - 17.0 g/dL   HCT 27.1 (L) 39.0 - 52.0 %   MCV 85.8 78.0 - 100.0 fL   MCH 25.9 (L) 26.0 - 34.0 pg   MCHC 30.3 30.0 - 36.0 g/dL   RDW 18.0 (H) 11.5 - 15.5 %   Platelets 114 (L) 150 - 400 K/uL  Comment: REPEATED TO VERIFY CONSISTENT WITH PREVIOUS RESULT   Basic metabolic panel     Status: Abnormal   Collection Time: 09/29/15  4:15 AM  Result Value Ref Range   Sodium 141 135 - 145 mmol/L   Potassium 4.0 3.5 - 5.1 mmol/L   Chloride 108 101 - 111 mmol/L   CO2 29 22 - 32 mmol/L   Glucose, Bld 88 65 - 99 mg/dL   BUN 9 6 - 20 mg/dL   Creatinine, Ser 0.87 0.61 - 1.24 mg/dL   Calcium 8.6 (L) 8.9 - 10.3 mg/dL   GFR calc non Af Amer >60 >60 mL/min   GFR calc Af Amer >60 >60 mL/min    Comment: (NOTE) The eGFR has been calculated using the CKD EPI equation. This calculation has not been validated in all clinical situations. eGFR's persistently <60 mL/min signify possible Chronic Kidney Disease.    Anion gap 4 (L) 5 - 15  Protime-INR     Status: Abnormal   Collection Time: 09/30/15  5:40 AM  Result Value Ref Range   Prothrombin Time 26.7 (H) 11.6 - 15.2 seconds   INR 2.50 (H) 0.00 - 1.49  Lactate dehydrogenase     Status: Abnormal   Collection Time: 09/30/15  5:40 AM  Result Value Ref Range   LDH 214 (H) 98 - 192 U/L  CBC     Status: Abnormal   Collection Time: 09/30/15  5:40 AM  Result Value Ref Range   WBC 3.5 (L) 4.0 - 10.5 K/uL   RBC 3.31 (L) 4.22 - 5.81 MIL/uL   Hemoglobin 8.4 (L)  13.0 - 17.0 g/dL   HCT 28.4 (L) 39.0 - 52.0 %   MCV 85.8 78.0 - 100.0 fL   MCH 25.4 (L) 26.0 - 34.0 pg   MCHC 29.6 (L) 30.0 - 36.0 g/dL   RDW 18.5 (H) 11.5 - 15.5 %   Platelets 112 (L) 150 - 400 K/uL    Comment: CONSISTENT WITH PREVIOUS RESULT  Basic metabolic panel     Status: Abnormal   Collection Time: 09/30/15  5:40 AM  Result Value Ref Range   Sodium 140 135 - 145 mmol/L   Potassium 4.0 3.5 - 5.1 mmol/L   Chloride 107 101 - 111 mmol/L   CO2 29 22 - 32 mmol/L   Glucose, Bld 98 65 - 99 mg/dL   BUN 12 6 - 20 mg/dL   Creatinine, Ser 0.98 0.61 - 1.24 mg/dL   Calcium 8.5 (L) 8.9 - 10.3 mg/dL   GFR calc non Af Amer >60 >60 mL/min   GFR calc Af Amer >60 >60 mL/min    Comment: (NOTE) The eGFR has been calculated using the CKD EPI equation. This calculation has not been validated in all clinical situations. eGFR's persistently <60 mL/min signify possible Chronic Kidney Disease.    Anion gap 4 (L) 5 - 15     HEENT: normal Cardio: LVAD hum Resp: CTA B/L and unlabored GI: NT, ND Extremity:  Pulses positive and No Edema Skin:   Breakdown sacral gr 2 Neuro: Confused and Abnormal Motor 4/5 in BUE and BLE Musc/Skel:  Other no pain with UE or LE ROM Gen NAD   Assessment/Plan: 1. Functional deficits secondary to deconditioning and encephalopathy which require 3+ hours per day of interdisciplinary therapy in a comprehensive inpatient rehab setting. Physiatrist is providing close team supervision and 24 hour management of active medical problems listed below. Physiatrist and rehab team continue to assess barriers to discharge/monitor  patient progress toward functional and medical goals. FIM: Function - Bathing Position: Wheelchair/chair at sink Body parts bathed by patient: Right arm, Left arm, Chest, Front perineal area, Right upper leg, Left upper leg, Abdomen, Buttocks, Right lower leg, Left lower leg Body parts bathed by helper: Back Bathing not applicable: Left lower leg, Right  lower leg Assist Level: Supervision or verbal cues  Function- Upper Body Dressing/Undressing What is the patient wearing?: Pull over shirt/dress Pull over shirt/dress - Perfomed by patient: Thread/unthread right sleeve, Thread/unthread left sleeve, Pull shirt over trunk, Put head through opening Pull over shirt/dress - Perfomed by helper: Put head through opening, Thread/unthread right sleeve, Thread/unthread left sleeve, Pull shirt over trunk Assist Level: Touching or steadying assistance(Pt > 75%) (assist to orient to shirt, problem solve) Set up : To obtain clothing/put away Function - Lower Body Dressing/Undressing What is the patient wearing?: Pants Position:  (Standing from toilet with RW) Underwear - Performed by patient: Thread/unthread right underwear leg, Pull underwear up/down Underwear - Performed by helper: Thread/unthread left underwear leg Pants- Performed by patient: Pull pants up/down Pants- Performed by helper: Thread/unthread right pants leg, Thread/unthread left pants leg Non-skid slipper socks- Performed by patient: Don/doff right sock, Don/doff left sock Non-skid slipper socks- Performed by helper: Don/doff right sock, Don/doff left sock Shoes - Performed by patient: Don/doff right shoe, Don/doff left shoe TED Hose - Performed by helper: Don/doff right TED hose, Don/doff left TED hose Assist for footwear: Partial/moderate assist Assist for lower body dressing: Touching or steadying assistance (Pt > 75%) (Mod assist sit > stand)  Function - Toileting Toileting steps completed by patient: Adjust clothing after toileting Toileting steps completed by helper: Adjust clothing prior to toileting, Adjust clothing after toileting, Performs perineal hygiene Toileting Assistive Devices: Grab bar or rail Assist level: Supervision or verbal cues, Touching or steadying assistance (Pt.75%), More than reasonable time  Function - Archivist transfer assistive device:  Walker Assist level to toilet: Touching or steadying assistance (Pt > 75%) Assist level from toilet: Touching or steadying assistance (Pt > 75%) Assist level to bedside commode (at bedside): Moderate assist (Pt 50 - 74%/lift or lower) Assist level from bedside commode (at bedside): Moderate assist (Pt 50 - 74%/lift or lower)  Function - Chair/bed transfer Chair/bed transfer method: Ambulatory Chair/bed transfer assist level: Supervision or verbal cues Chair/bed transfer assistive device: Walker Chair/bed transfer details: Verbal cues for technique, Verbal cues for precautions/safety  Function - Locomotion: Wheelchair Type: Manual (bil LE propulsion) Wheelchair activity did not occur: Safety/medical concerns (sternal precautions) Max wheelchair distance: 150 Assist Level: Supervision or verbal cues Wheel 50 feet with 2 turns activity did not occur: Safety/medical concerns Assist Level: Supervision or verbal cues Wheel 150 feet activity did not occur: Safety/medical concerns Assist Level: Supervision or verbal cues Turns around,maneuvers to table,bed, and toilet,negotiates 3% grade,maneuvers on rugs and over doorsills: No Function - Locomotion: Ambulation Assistive device: Walker-rolling Max distance: 250 Assist level: Supervision or verbal cues Assist level: Supervision or verbal cues Walk 50 feet with 2 turns activity did not occur: Safety/medical concerns Assist level: Supervision or verbal cues Walk 150 feet activity did not occur: Safety/medical concerns Assist level: Supervision or verbal cues Walk 10 feet on uneven surfaces activity did not occur: Safety/medical concerns  Function - Comprehension Comprehension: Auditory Comprehension assist level: Understands basic 75 - 89% of the time/ requires cueing 10 - 24% of the time  Function - Expression Expression: Verbal Expression assist level: Expresses basic needs/ideas: With extra  time/assistive device  Function - Social  Interaction Social Interaction assist level: Interacts appropriately with others with medication or extra time (anti-anxiety, antidepressant).  Function - Problem Solving Problem solving assist level: Solves basic 50 - 74% of the time/requires cueing 25 - 49% of the time  Function - Memory Memory assist level: Recognizes or recalls 50 - 74% of the time/requires cueing 25 - 49% of the time Patient normally able to recall (first 3 days only): Current season, That he or she is in a hospital  1. Hypoxic encephalopathy and Deconditioning secondary to acute on chronic systolic congestive heart failure/recent LVAD 52/09/4095 complicated by left pleural effusion with chest tube Continue CIR PT, OT, SLP Planned d/c 7/26 for supervision level.  Discussed with cardiology hsould be ready from their standpoint as well 2. DVT Prophylaxis/Anticoagulation: Chronic Coumadin therapy. Monitor for any bleeding episodes 3. Pain Management: Ultram as needed 4. Mood: Remeron increase to 30 mg daily at bedtime,  5. Neuropsych: This patient is capable of making decisions on his own behalf. 6. Skin/Wound Care: Routine skin checks 7. Fluids/Electrolytes/Nutrition: Routine I&O's with follow-up chemistries 8. Acute blood loss anemia. Follow-up CBC, Hemoglobins running in the 8 range 9. Hypertension/atrial fibrillation. Amiodarone 200 mg twice a day, Aldactone 25 mg daily. Monitor with increased mobility 10. BPH. Flomax 0.4 mg daily. Check PVR 3 11. Decreased nutritional storage. Follow-up dietary consult, enc protein intake, trial beneprotein, Appreciate dietary f/u 12. Memory deficits, per wife this was not a pre-existing problem.+encephalopathy, probable hypoxic, was having mental status changes prior to presentation with cardiogenic shock.  Slowly improving but still not oriented to time 13.  Freq urination-  oxybutnin low dose, still up 3 times, Neg UA, increase dose 14.  Constipation, hard stool, increase  colace, likely needs supp today, d/w RN BMET reviewed, normal LOS (Days) [place] E 09/30/2015, 8:40 AM

## 2015-09-30 NOTE — Progress Notes (Signed)
Occupational Therapy Session Note  Patient Details  Name: [name] MRN: [id number] Date of Birth: 16-Jul-1940  Today's Date: 09/30/2015 OT Individual Time: 0915-1010 and 1345-1430 OT Individual Time Calculation (min): 55 min and 45 min    Short Term Goals: Week 2:  OT Short Term Goal 1 (Week 2): LTG=STG OT Short Term Goal 2 (Week 2): Family education reguarding cogntion and how to provide cues and 24 hr supervision  Skilled Therapeutic Interventions/Progress Updates:    1) Treatment session with focus on recall of sternal precautions and sequencing with self-care tasks.  Pt received seated EOB finishing breakfast with LVAD RN present and discussing parameters and drop in PI in mornings and need for increase in fluid intake.  Max cues for sequencing and problem solving with locating clothing items prior to bathing.  Bathing and dressing completed at sit > stand level at sink with improved sequencing of bathing and orientation of shirt.  Pt continues to demonstrate decreased recall of sternal precautions and awareness of LVAD.  Wife present throughout session and begun education on recommended DME to decrease burden of care with toilet transfers.  Encouraged setup of routine to increase success and carryover of education.  Oral care completed in standing with focus on increased carryover to home.  Friends arrived at end of session asking pt about d/c, encouraged pt to utilize external aids (calendar) to increase recall.  2) Treatment session with focus on cognition with recall of sternal precautions, sequencing, orientation, and increased awareness.  Engaged in functional mobility with focus on recall and carryover of sternal precautions during sit > stand and safety with Rollator during mobility.  Pt able to locate numbers 1-12 placed on various heights in gym with min cues for sequencing, pt able to recall sequence of numbers however requiring 12 minutes to locate all numbers.  Engaged in  ball toss activity in standing with focus on recall and short term memory while engaging in categorical naming activity in alphabetical order.  Pt able to verbalize to therapist appropriate directions to navigate from pt's home to favorite restaurant with increased time to recall street names, however continues to demonstrate decreased short term memory and problem solving with various other familiar tasks.  Pt passed off to SLP.   Therapy Documentation Precautions:  Precautions Precautions: Sternal Precaution Comments: LVAD, Call VAD Pager [phone]. Flow less than 2.5, or sustained increase or decrease of 2.0 L Restrictions Weight Bearing Restrictions: No RUE Weight Bearing: Non weight bearing LUE Weight Bearing: Non weight bearing RLE Weight Bearing: Weight bearing as tolerated LLE Weight Bearing: Weight bearing as tolerated Pain:  Pt with no c/o pain  See Function Navigator for Current Functional Status.   Therapy/Group: Individual Therapy  [name] 09/30/2015, 10:20 AM

## 2015-09-30 NOTE — Plan of Care (Signed)
Problem: RH BOWEL ELIMINATION Goal: RH STG MANAGE BOWEL WITH ASSISTANCE STG Manage Bowel with min Assistance.   Outcome: Not Progressing Required suppository, no BM x 3 days after miralax and metamucil  Problem: RH PAIN MANAGEMENT Goal: RH STG PAIN MANAGED AT OR BELOW PT'S PAIN GOAL 2 or less   Outcome: Not Progressing Patient having "pocket pain" question neurotin and encourage fluid intake

## 2015-10-01 ENCOUNTER — Inpatient Hospital Stay (HOSPITAL_COMMUNITY): Payer: Medicare Other | Admitting: Speech Pathology

## 2015-10-01 ENCOUNTER — Inpatient Hospital Stay (HOSPITAL_COMMUNITY): Payer: Medicare Other | Admitting: Occupational Therapy

## 2015-10-01 ENCOUNTER — Inpatient Hospital Stay (HOSPITAL_COMMUNITY): Payer: Medicare Other | Admitting: Physical Therapy

## 2015-10-01 DIAGNOSIS — F0390 Unspecified dementia without behavioral disturbance: Secondary | ICD-10-CM

## 2015-10-01 LAB — BASIC METABOLIC PANEL
Anion gap: 5 (ref 5–15)
BUN: 15 mg/dL (ref 6–20)
CALCIUM: 8.5 mg/dL — AB (ref 8.9–10.3)
CO2: 27 mmol/L (ref 22–32)
Chloride: 107 mmol/L (ref 101–111)
Creatinine, Ser: 0.86 mg/dL (ref 0.61–1.24)
GFR calc Af Amer: >60 mL/min (ref >60)
GLUCOSE: 98 mg/dL (ref 65–99)
POTASSIUM: 4 mmol/L (ref 3.5–5.1)
Sodium: 139 mmol/L (ref 135–145)

## 2015-10-01 LAB — CBC
HEMATOCRIT: 29 % — AB (ref 39.0–52.0)
Hemoglobin: 8.5 g/dL — ABNORMAL LOW (ref 13.0–17.0)
MCH: 25.3 pg — AB (ref 26.0–34.0)
MCHC: 29.3 g/dL — ABNORMAL LOW (ref 30.0–36.0)
MCV: 86.3 fL (ref 78.0–100.0)
PLATELETS: 117 10*3/uL — AB (ref 150–400)
RBC: 3.36 MIL/uL — ABNORMAL LOW (ref 4.22–5.81)
RDW: 18.7 % — AB (ref 11.5–15.5)
WBC: 3.3 10*3/uL — ABNORMAL LOW (ref 4.0–10.5)

## 2015-10-01 LAB — PROTIME-INR
INR: 2.1 — ABNORMAL HIGH (ref 0.00–1.49)
PROTHROMBIN TIME: 23.4 s — AB (ref 11.6–15.2)

## 2015-10-01 LAB — LACTATE DEHYDROGENASE: LDH: 226 U/L — ABNORMAL HIGH (ref 98–192)

## 2015-10-01 MED ORDER — WARFARIN SODIUM 2.5 MG PO TABS
2.5000 mg | ORAL_TABLET | Freq: Once | ORAL | Status: AC
  Administered 2015-10-01: 2.5 mg via ORAL
  Filled 2015-10-01: qty 1

## 2015-10-01 MED ORDER — SORBITOL 70 % SOLN
45.0000 mL | Freq: Once | Status: AC
  Administered 2015-10-01: 45 mL via ORAL
  Filled 2015-10-01: qty 60

## 2015-10-01 MED ORDER — MAGNESIUM HYDROXIDE 400 MG/5ML PO SUSP
30.0000 mL | Freq: Every day | ORAL | Status: DC | PRN
  Filled 2015-10-01: qty 30

## 2015-10-01 MED ORDER — FLEET ENEMA 7-19 GM/118ML RE ENEM
1.0000 | ENEMA | Freq: Once | RECTAL | Status: AC
  Administered 2015-10-01: 1 via RECTAL
  Filled 2015-10-01: qty 1

## 2015-10-01 MED ORDER — ACETAMINOPHEN 325 MG PO TABS: 650.0000 mg | ORAL_TABLET | ORAL | Status: AC | PRN

## 2015-10-01 NOTE — Progress Notes (Signed)
Occupational Therapy Discharge Summary  Patient Details  Name: [name] MRN: [id number] Date of Birth: 1940/10/14  Patient has met 9 of 10 long term goals due to improved activity tolerance, improved balance, postural control, ability to compensate for deficits and improved attention.  Patient to discharge at overall Supervision level.  Patient's care partner is independent to provide the necessary physical and cognitive assistance at discharge.  Patient continues to require min-mod verbal cues for sequencing and procedural memory, pt's wife has demonstrated ability to provide verbal cues as needed to increase pt success.  Reasons goals not met: Pt continues to demonstrate decreased awareness of deficits and safety awareness with LVAD, therefore requiring 24 hr supervision/cues to promote safety due to medical necessity/fragility of LVAD.  Recommendation:  Patient will benefit from ongoing skilled OT services in home health setting to continue to advance functional skills in the area of BADL and Reduce care partner burden.  Equipment: 3 in 1 commode  Reasons for discharge: treatment goals met and discharge from hospital  Patient/family agrees with progress made and goals achieved: Yes  OT Discharge Precautions/Restrictions  Precautions Precautions: Sternal Precaution Comments: LVAD, Call VAD Pager [phone]. Flow less than 2.5, or sustained increase or decrease of 2.0 L Restrictions Other Position/Activity Restrictions: sternal precautions General   Vital Signs  Pain Pain Assessment Pain Assessment: No/denies pain ADL   Vision/Perception  Vision- History Baseline Vision/History: Cataracts (had cataract surgery recently) Patient Visual Report: No change from baseline Vision- Assessment Vision Assessment?: No apparent visual deficits  Cognition Overall Cognitive Status: Impaired/Different from baseline Arousal/Alertness: Awake/alert Orientation Level: Oriented to  person;Oriented to place;Disoriented to time;Oriented to situation Attention: Sustained Sustained Attention: Impaired Sustained Attention Impairment: Verbal basic;Functional basic Memory: Impaired Memory Impairment: Storage deficit;Retrieval deficit;Decreased recall of new information Awareness: Impaired Awareness Impairment: Intellectual impairment Problem Solving: Impaired Problem Solving Impairment: Verbal basic;Functional basic Safety/Judgment: Impaired Sensation Sensation Light Touch: Appears Intact Stereognosis: Not tested Hot/Cold: Not tested Proprioception: Appears Intact Coordination Gross Motor Movements are Fluid and Coordinated: Not tested Fine Motor Movements are Fluid and Coordinated: Not tested Motor    Mobility     Trunk/Postural Assessment     Balance Balance Balance Assessed: Yes Standardized Balance Assessment Standardized Balance Assessment: Berg Balance Test Berg Balance Test Sit to Stand: Able to stand without using hands and stabilize independently Standing Unsupported: Able to stand safely 2 minutes Sitting with Back Unsupported but Feet Supported on Floor or Stool: Able to sit safely and securely 2 minutes Stand to Sit: Sits safely with minimal use of hands Transfers: Able to transfer safely, minor use of hands Standing Unsupported with Eyes Closed: Able to stand 10 seconds with supervision Standing Ubsupported with Feet Together: Able to place feet together independently and stand for 1 minute with supervision From Standing, Reach Forward with Outstretched Arm: Can reach forward >5 cm safely (2") From Standing Position, Pick up Object from Floor: Able to pick up shoe, needs supervision From Standing Position, Turn to Look Behind Over each Shoulder: Looks behind one side only/other side shows less weight shift Turn 360 Degrees: Able to turn 360 degrees safely but slowly Standing Unsupported, Alternately Place Feet on Step/Stool: Able to complete >2  steps/needs minimal assist Standing Unsupported, One Foot in Front: Able to plae foot ahead of the other independently and hold 30 seconds Standing on One Leg: Able to lift leg independently and hold equal to or more than 3 seconds Total Score: 42 Static Sitting Balance Static Sitting - Balance Support:  Feet supported Static Sitting - Level of Assistance: 6: Modified independent (Device/Increase time) Dynamic Sitting Balance Dynamic Sitting - Balance Support: Feet supported Dynamic Sitting - Level of Assistance: 6: Modified independent (Device/Increase time) Static Standing Balance Static Standing - Balance Support: Bilateral upper extremity supported Static Standing - Level of Assistance: 5: Stand by assistance Dynamic Standing Balance Dynamic Standing - Balance Support: Right upper extremity supported;Left upper extremity supported Dynamic Standing - Level of Assistance: 5: Stand by assistance Dynamic Standing - Balance Activities: Lateral lean/weight shifting;Forward lean/weight shifting;Compliant surfaces;Reaching for objects Extremity/Trunk Assessment RUE Assessment RUE Assessment: Within Functional Limits (strength grossly 4/5, limited ROM and guarded due to sternal precautions) LUE Assessment LUE Assessment: Within Functional Limits (ROM WFL, strength grossly 4/5 limited assessment and guarded due to sternal precautions)   See Function Navigator for Current Functional Status.  Simonne Come 10/01/2015, 3:53 PM

## 2015-10-01 NOTE — Progress Notes (Signed)
ANTICOAGULATION CONSULT NOTE - Follow Up Consult  Pharmacy Consult for Coumadin Indication: LVAD  Allergies  Allergen Reactions  . Phenergan [Promethazine Hcl] Nausea And Vomiting  . Lasix [Furosemide] Other (See Comments)    "Dropped blood pressure too low"  . Morphine And Related Other (See Comments)    Goes crazy     Patient Measurements: Weight: 166 lb 14.4 oz (75.7 kg)  Vital Signs: Temp: 98.1 F (36.7 C) (07/25 0526) Temp Source: Oral (07/25 0526) Pulse Rate: 67 (07/25 0526)  Labs:  Recent Labs  09/29/15 0415 09/30/15 0540 10/01/15 0410  HGB 8.2* 8.4* 8.5*  HCT 27.1* 28.4* 29.0*  PLT 114* 112* 117*  LABPROT 27.7* 26.7* 23.4*  INR 2.63* 2.50* 2.10*  CREATININE 0.87 0.98 0.86    Estimated Creatinine Clearance: 81.9 mL/min (by C-G formula based on SCr of 0.86 mg/dL).  Assessment: 52 yom s/p LVAD 6/23 continues on coumadin (started low dose per MD on 6/24, pharmacy dosing starting 7/3). INR is therapeutic today. He continues on po amiodarone. CBC stable. No bleeding. Appetite waxing and waning  INR today remains therapeutic at 2.1  Goal of Therapy:  INR 2-2.5 Monitor platelets by anticoagulation protocol: yes   Plan:  1) Repeat Coumadin 2.5 mg x 1 2) Daily INR  Tad Moore, BCPS  Clinical Pharmacist Pager [phone]  10/01/2015 1:23 PM

## 2015-10-01 NOTE — Progress Notes (Signed)
Physical Therapy Discharge Summary  Patient Details  Name: [name] MRN: [id number] Date of Birth: 06/02/1940  Today's Date: 10/01/2015 PT Individual Time: 1300-1400 PT Individual Time Calculation (min): 60 min     Patient has met 8 of 9 long term goals due to improved activity tolerance, improved balance, improved postural control, increased strength, ability to compensate for deficits, functional use of  right lower extremity and left lower extremity, improved attention, improved awareness and improved coordination.  Patient to discharge at an ambulatory level Supervision.   Patient's care partner is independent to provide the necessary physical and cognitive assistance at discharge.  Reasons goals not met:  Day to day recall goal unmet, pt currently requiring mod/maxA  Recommendation:  Patient will benefit from ongoing skilled PT services in home health setting to continue to advance safe functional mobility, address ongoing impairments in activity tolerance, strength, balance, coordination, understanding of deficits, LVAD management, and minimize fall risk.  Equipment: Rollator  Reasons for discharge: treatment goals met and discharge from hospital  Patient/family agrees with progress made and goals achieved: Yes  PT Discharge Precautions/RestrictionsPrecautions Precautions: Sternal Precaution Comments: LVAD, Call VAD Pager [phone]. Flow less than 2.5, or sustained increase or decrease of 2.0 L Restrictions Other Position/Activity Restrictions: sternal precautions Pain Pain Assessment Pain Assessment: No/denies pain Cognition Overall Cognitive Status: Impaired/Different from baseline Arousal/Alertness: Awake/alert Orientation Level: Oriented to person;Oriented to place;Disoriented to time;Oriented to situation Attention: Sustained Sustained Attention: Impaired Sustained Attention Impairment: Verbal basic;Functional basic Memory: Impaired Memory Impairment:  Storage deficit;Retrieval deficit;Decreased recall of new information Awareness: Impaired Awareness Impairment: Intellectual impairment Problem Solving: Impaired Problem Solving Impairment: Verbal basic;Functional basic Safety/Judgment: Impaired Sensation Sensation Light Touch: Appears Intact Stereognosis: Not tested Hot/Cold: Not tested Proprioception: Appears Intact Coordination Gross Motor Movements are Fluid and Coordinated: Not tested Fine Motor Movements are Fluid and Coordinated: Not tested Motor  Motor Motor - Discharge Observations: Generalized weakness  Mobility Bed Mobility Bed Mobility: Supine to Sit;Sit to Supine Supine to Sit: 5: Supervision Sit to Supine: 5: Supervision Transfers Transfers: Yes Stand to Sit: 5: Supervision Stand Pivot Transfers: 5: Supervision Stand Pivot Transfer Details: Verbal cues for precautions/safety Locomotion  Ambulation Ambulation: Yes Ambulation/Gait Assistance: 5: Supervision Ambulation Distance (Feet): 250 Feet Assistive device: Rollator Ambulation/Gait Assistance Details: Verbal cues for safe use of DME/AE;Verbal cues for precautions/safety Ambulation/Gait Assistance Details: cues for management of rollator, locking brakes, maintaining RW in front of body Gait Gait: Yes Gait Pattern: Impaired Gait Pattern: Decreased trunk rotation;Lateral hip instability;Right flexed knee in stance;Left flexed knee in stance;Right foot flat;Left foot flat Gait velocity: 2.42 ft/sec Stairs / Additional Locomotion Stairs: Yes Stairs Assistance: 5: Supervision Stairs Assistance Details: Verbal cues for technique;Verbal cues for precautions/safety Stair Management Technique: Two rails;Forwards;Alternating pattern Number of Stairs: 12 Height of Stairs: 6 Ramp: 5: Supervision Curb: 5: Supervision Wheelchair Mobility Wheelchair Mobility: No  Trunk/Postural Assessment  Cervical Assessment Cervical Assessment: Within Functional  Limits Thoracic Assessment Thoracic Assessment: Within Functional Limits Lumbar Assessment Lumbar Assessment: Within Functional Limits Postural Control Postural Control: Deficits on evaluation (delayed righting/stepping reactions)  Balance Balance Balance Assessed: Yes Standardized Balance Assessment Standardized Balance Assessment: Berg Balance Test Berg Balance Test Sit to Stand: Able to stand without using hands and stabilize independently Standing Unsupported: Able to stand safely 2 minutes Sitting with Back Unsupported but Feet Supported on Floor or Stool: Able to sit safely and securely 2 minutes Stand to Sit: Sits safely with minimal use of hands Transfers: Able to transfer safely, minor use   of hands Standing Unsupported with Eyes Closed: Able to stand 10 seconds with supervision Standing Ubsupported with Feet Together: Able to place feet together independently and stand for 1 minute with supervision From Standing, Reach Forward with Outstretched Arm: Can reach forward >5 cm safely (2") From Standing Position, Pick up Object from Floor: Able to pick up shoe, needs supervision From Standing Position, Turn to Look Behind Over each Shoulder: Looks behind one side only/other side shows less weight shift Turn 360 Degrees: Able to turn 360 degrees safely but slowly Standing Unsupported, Alternately Place Feet on Step/Stool: Able to complete >2 steps/needs minimal assist Standing Unsupported, One Foot in Front: Able to plae foot ahead of the other independently and hold 30 seconds Standing on One Leg: Able to lift leg independently and hold equal to or more than 3 seconds Total Score: 42 Static Sitting Balance Static Sitting - Balance Support: Feet supported Static Sitting - Level of Assistance: 6: Modified independent (Device/Increase time) Dynamic Sitting Balance Dynamic Sitting - Balance Support: Feet supported Dynamic Sitting - Level of Assistance: 6: Modified independent  (Device/Increase time) Static Standing Balance Static Standing - Balance Support: Bilateral upper extremity supported Static Standing - Level of Assistance: 5: Stand by assistance Dynamic Standing Balance Dynamic Standing - Balance Support: Right upper extremity supported;Left upper extremity supported Dynamic Standing - Level of Assistance: 5: Stand by assistance Dynamic Standing - Balance Activities: Lateral lean/weight shifting;Forward lean/weight shifting;Compliant surfaces;Reaching for objects Extremity Assessment  RUE Assessment RUE Assessment: Within Functional Limits (strength grossly 4/5, limited ROM and guarded due to sternal precautions) LUE Assessment LUE Assessment: Within Functional Limits (ROM WFL, strength grossly 4/5 limited assessment and guarded due to sternal precautions) RLE Assessment RLE Assessment: Exceptions to [facility] (grossly 4-/5 to 4/5 throughout) LLE Assessment LLE Assessment: Exceptions to [facility] (grossly 4-/5 to 4/5 throughout)  Skilled Therapeutic Intervention: Pt received seated in recliner; denies pain and agreeable to treatment. Assessed all mobility as described above with S overall using rollator for transfers and ambulation. Pt continues to require cues for hand placement d/t sternal precautions, management of rollator to maintain in front of body when turning, and cues for navigation to familiar places. Assessed Berg with results as above. Patient demonstrates increased fall risk as noted by score of 42/56 on Berg Balance Scale.  (<36= high risk for falls, close to 100%; 37-45 significant >80%; 46-51 moderate >50%; 52-55 lower >25%). Pt, pt's wife, and daughter educated on pt's improving balance however continued risk for falls and recommendation for use of AD at home. Pt's wife reports they were unable to find their family members rollator that they had planned on using, but already discussed and ordered with CSW. Pt remained seated in recliner with family present  at end of session, needs in reach.    See Function Navigator for Current Functional Status.  Benjiman Core Tygielski 10/01/2015, 3:02 PM

## 2015-10-01 NOTE — Consult Note (Signed)
  NEUROBEHAVIORAL STATUS EXAM - [facility]FIDENTIAL [facility]   MEDICAL NECESSITY:  [name] was seen on the [facility] Unit for a neurobehavioral status exam to assist in treatment planning during admission.   Records indicate that [name] is a "75 y.o. right handed male with history of cardiomyopathy and left bundle branch block.Presented 08/21/2015 through the heart failure clinic with dizziness and low volume output. 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 Found to be in cardiogenic shock. Workup per cardiology services and cardiothoracic surgery patient underwent LVAD 08/30/2015 per Dr. Zenaida Niece trigt.Bouts of confusion with therapies initiated. Placed on Remeron and Celexa for mood stabilization. Patient presently maintained on aspirin and Coumadin therapy. Acute on chronic anemia 8.5 and monitored. Physical and occupational therapy evaluations completed and ongoing with recommendations of physical medicine rehabilitation consult. Patient was admitted for a comprehensive rehabilitation program."   During today's visit, [name] was accompanied by his wife who assisted with the history. From a cognitive perspective, his wife has noticed bouts of confusion and mild issues with mixing up words that she believes to be medication side effects. The patient himself denied having any concerns about his cognitive abilities.  From an emotional standpoint, [name] described himself to be in generally good spirits despite his current medical circumstances. He has no history of being treated for mental illness and he denied ever experiencing any prolonged periods of depression or anxiety throughout his life. In fact, when discussing his discharge date he mentioned being quite happy to be going home. No adjustment issues endorsed. Suicidal/homicidal ideation, plan or intent was denied. No manic or hypomanic  episodes were reported. The patient denied ever experiencing any auditory/visual hallucinations. No major behavioral or personality changes were endorsed.   [name] feels that progress is being made in therapy. No barriers to therapy identified. He described the rehabilitation staff as "good." He has his wife and family to support him throughout this endeavor.  PROCEDURES: [2 units 96116] Diagnostic clinical interview  Review of available records [place]'s Cognitive Examination-III  IMPRESSION: Overall, [name] denied experiencing any significant cognitive symptoms. However, brief examination revealed severe deficits across all domains assessed. This would be considered at the level of dementia. Areas impacted include learning and memory, orientation to time and place, attention and concentration, processing speed, and executive functions. Of note, the patient seemed to realize he was struggling during the evaluation and became quite irritable. However, despite this, there are no clear indications of clinical psychopathology.  Provisionally, the patient's performance meets the criteria for a diagnosis of Major Neurocognitive Disorder (i.e., dementia). While an exact etiology is unclear, I wonder whether he suffered an anoxic or hypoxic brain injury during his medical crisis. Regardless, functionally, he is performing quite poorly. As such, I recommended his care providers continue to explore the origin(s) of his cognitive dysfunction and implement treatment is warranted. Neuropsychological evaluation upon discharge would also be valuable.   PROVISIONAL DIAGNOSIS:  Major Neurocognitive Disorder (i.e., dementia), etiology unclear   Debbe Mounts, Psy.D., ABN Board-Certified Clinical Neuropsychologist

## 2015-10-01 NOTE — Progress Notes (Signed)
Per patient and patient wife, driveline dressing changes are every other day and dressing change was last performed 09/30/15; due next 10/02/15.  Refused dressing change at this time.  Patient nauseas, refused offer of Zofran 2000.  Large vomit x1 of undigested food/liquid 2115, immediately pt felt better.    Soap suds enema administered per order, and pt up to [facility] with large stool return.  (small/medium hard stool this afternoon).  Patient with relief.  Will continue to monitor patient.

## 2015-10-01 NOTE — Progress Notes (Signed)
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 Implant hospitalization was from 08/22/15 - 09/19/15.   Vital signs: Temo: 98.1 HR: 67 Doppler MAP: 78  Wt in lbs:  179 > 170 > 168 > 167 > 161 > 157 > 165 > 165 > 161 > 166lbs   LVAD interrogation reveals:  Speed: 9200 Flow:4.8 Power: 5.3w PI: 5.4 Alarms: none Events: 1 PI event  Fixed speed:  9200 Low speed limit: 8600  Drive Line:  C/D/I. Daily dressing changes using gauze dressing with Aquacel silver strip on exit site being maintained per wife. Dressing supplies at bedside. Proximal stitch removed yesterday. Observed wife change dressing yesterday as well. She is getting more comfortable overall did a good job.  Labs:  LDH trend:  196 > 266 > 188 > 190 > 182 > 181 > 214 > 226   INR trend: 2.12 > 2.47 > 3.0 > 2.95 > 2.45 > 2.0 > 1.0 > 2.02 > 2.5 > 2.10  Hgb:  8.5 > 9.0 > 8.3 > 8.4 > 8.3 > 8.8 > 8.4 > 8.5  Antithrombotic Management:   INR goal: 2.0 - 2.5 ASA 81 mg    Plan/Recommendations:  1. Pt performing well in rehab, tentative d/c tomorrow.  2. Less drop in PI today with orthostatic changes; baseline PI 6.6 lying; 5.1 with standing.  3. Will plan to remove sutures from left chest tube site today.  Carlton Adam, RN VAD Coordinator  Office: [phone] 24/7 Emergency VAD Pager: [phone]

## 2015-10-01 NOTE — Progress Notes (Signed)
Speech Language Pathology Discharge Summary and Final Treatment Note   Patient Details  Name: [name] MRN: [id number] Date of Birth: November 17, 1940  Today's Date: 10/01/2015 SLP Individual Time: 1400-1500 SLP Individual Time Calculation (min): 60 min    Skilled Therapeutic Interventions:   Skilled treatment session focused on addressing diagnostic treatment of cognition goals. SLP facilitated session by administering the [facility] Cognitive Assessment, 7.2.  Patient required increased wait time and demonstrated skills consistent with a score of 6/30 with 26 or greater being considered WNL.  Education has been ongoing with patient and spouse for 24/7 Supervision with all basic self-care tasks. Recommended home management strategies for carryover of routines and schedules is recommended as well as follow up SLP services.       Patient has met 1 of 4 long term goals.  Patient to discharge at overall Mod level.  Reasons goals not met:  severity of cognitive impairments, especially attention    Clinical Impression/Discharge Summary:    Patient has made functional gains during this rehab admission and has met 1 out of 4 long term goals due to improved orientation with use of external aids and awareness of physical and cognitive deficits.  Patient is currently an overall Min-Mod assist for safe completion of basic cognitive and ADL tasks.  Patient and family education has been completed, but will need to be reinforced at the next level of care.  Patient will discharge home with 24 hour supervision. Patient would benefit from follow up SLP services to continue efforts to maximize functional independence and further reduce the burden of care.   Care Partner:  Caregiver Able to Provide Assistance: Yes  Type of Caregiver Assistance: Physical;Cognitive  Recommendation:  24 hour supervision/assistance;Home Health SLP;Outpatient SLP  Rationale for SLP Follow Up: Maximize cognitive function and  independence;Reduce caregiver burden   Equipment: none   Reasons for discharge: Discharged from hospital   Patient/Family Agrees with Progress Made and Goals Achieved: Yes   Function:  Cognition Comprehension Comprehension assist level: Understands basic 75 - 89% of the time/ requires cueing 10 - 24% of the time  Expression   Expression assist level: Expresses basic 75 - 89% of the time/requires cueing 10 - 24% of the time. Needs helper to occlude trach/needs to repeat words.  Social Interaction Social Interaction assist level: Interacts appropriately 75 - 89% of the time - Needs redirection for appropriate language or to initiate interaction.  Problem Solving Problem solving assist level: Solves basic 50 - 74% of the time/requires cueing 25 - 49% of the time  Memory Memory assist level: Recognizes or recalls 25 - 49% of the time/requires cueing 50 - 75% of the time   [name]., [place]  Jonea Bukowski 10/01/2015, 5:28 PM

## 2015-10-01 NOTE — Progress Notes (Signed)
Patient ID: [name], male   DOB: January 22, 1941, 75 y.o.   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   Ramp echo 7/10 with increase in speed to 9400 rpm. Speed decreased to 9200 09/24/15.  PIs went from >20 to 4-5 per day.   Tired this am but overall continues to get stronger. Appreciate work with PT. Mental status continues to improve. Denies lightheadedness or dizziness.   MAPs back and forth from 60-80s.  Tentative discharge 10/02/15.    LVAD INTERROGATION:  HeartMate II LVAD:  Flow 4.9 liters/min, speed 9200, power 6, PI 5.4.  < 10 PI events daily.    Objective:    Vital Signs:   Temp:  [98.1 F (36.7 C)] 98.1 F (36.7 C) (07/25 0526) Pulse Rate:  [67] 67 (07/25 0526) Resp:  [17] 17 (07/25 0526) SpO2:  [96 %] 96 % (07/25 0526) Weight:  [166 lb 14.4 oz (75.7 kg)] 166 lb 14.4 oz (75.7 kg) (07/25 0700) Last BM Date: 09/27/15 Mean arterial Pressure 60-80s  Intake/Output:   Intake/Output Summary (Last 24 hours) at 10/01/15 0855 Last data filed at 09/30/15 2155  Gross per 24 hour  Intake              720 ml  Output              800 ml  Net              -80 ml     Physical Exam: General: NAD. Lying in bed HEENT: Normal Neck: Supple. JVP flat Cor: LVAD hum.  Lungs: CTAB, normal effort  +chest tube site with sutures remaining Abdomen: soft, non-tender, non-distended, no HSM. No bruits or masses. +BS  Driveline site ok. Securement device in place.  Extremities: no cyanosis, clubbing, rash.  TED hose on. No edema  Neuro: Alert and oriented x 2. (Person and place)   Labs: Basic Metabolic Panel:  Recent Labs Lab 09/26/15 1225 09/27/15 0355 09/28/15 0415 09/29/15 0415 09/30/15 0540 10/01/15 0410  NA 139 141 142 141 140 139  K 3.9 4.4 4.2 4.0 4.0 4.0  CL 108 108 109 108 107 107  CO2 GLUCOSE 143* 94 83 88 98 98  BUN CREATININE 1.00 0.87 0.91 0.87 0.98 0.86  CALCIUM 8.7* 8.5* 8.8* 8.6* 8.5* 8.5*  MG 2.3  --   --   --   --   --     Liver Function Tests: No results for input(s): AST, ALT, ALKPHOS, BILITOT, PROT, ALBUMIN in the last 168 hours. No results for input(s): LIPASE, AMYLASE in the last 168 hours. No results for input(s): AMMONIA in the last 168 hours.  CBC:  Recent Labs Lab 09/27/15 0355 09/28/15 0415 09/29/15 0415 09/30/15 0540 10/01/15 0410  WBC 3.9* 3.5* 3.3* 3.5* 3.3*  HGB 8.8* 8.5* 8.2* 8.4* 8.5*  HCT 30.1* 28.7* 27.1* 28.4* 29.0*  MCV 85.8 86.4 85.8 85.8 86.3  PLT 141* 135* 114* 112* 117*    INR:  Recent Labs Lab 09/27/15 0355 09/28/15 0415 09/29/15 0415 09/30/15 0540 10/01/15 0410  INR 2.02* HERNY SCURLOCK0* 2.10*    Other results:  EKG:   Imaging: No results found.   Medications:     Scheduled Medications: . amiodarone  200 mg Oral BID  . aspirin  EC  81 mg Oral Daily  . bisacodyl  10 mg Oral Daily   Or  . bisacodyl  10 mg Rectal Daily  . docusate sodium  200 mg Oral BID  . feeding supplement (ENSURE ENLIVE)  237 mL Oral BID BM  . ferrous fumarate-b12-vitamic C-folic acid  1 capsule Oral TID PC  . mirtazapine  30 mg Oral QHS  . oxybutynin  5 mg Oral QHS  . pantoprazole  40 mg Oral Daily  . polyethylene glycol  17 g Oral Daily  . protein supplement  1 scoop Oral TID WC  . psyllium  1 packet Oral BID  . sodium chloride flush  10-40 mL Intracatheter Q12H  . sodium phosphate  1 enema Rectal Once  . tamsulosin  0.4 mg Oral Daily  . Warfarin - Pharmacist Dosing Inpatient   Does not apply q1800    Infusions:    PRN Medications: acetaminophen, magnesium hydroxide, ondansetron **OR** ondansetron (ZOFRAN) IV, polyvinyl alcohol, sodium chloride flush, sorbitol, traMADol   Assessment:   1. 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 EF 15%. Cath 8/14 with normal cors. 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  2. Frequent  PVCs 3. Severe malnutrition- Prealbumin 14.7 on 6/20 4. Acute post-op delirium/sundowning 5. Urinary Retention requiring Foley 6. Left pleural effusion s/p chest tube  Plan/Discussion:    Overall improving working with PT and pushing oral intake. Appetite waxes and wanes.   Pharmacy dosing coumadin, goal INR 2-2.5.  INR 2.1 today. Hemoglobin 8.5, no bleeding noted.  MAP between 60s and 80s.   Remove PICC line today. Sutures to be removed from chest tube site.   Isolated power spike noted (10.4) week of 09/18/15. Event log was reviewed thought to be pump burn in.  LDH relatively stable.    PI has been dropping with standing, but this has improved with improved appetite and po intake.   Appreciate CIR and input. Continue therapy. HF follow up has been set up for next week.  I reviewed the LVAD parameters from today, and compared the results to the patient's prior recorded data.  No programming changes were made.  The LVAD is functioning within specified parameters.  The patient performs LVAD self-test daily.  LVAD interrogation was negative for any significant power changes, alarms or PI events/speed drops.  LVAD equipment check completed and is in good working order.  Back-up equipment present.   LVAD education done on emergency procedures and precautions and reviewed exit site care.  Length of Stay: [address]  Graciella Freer PA-C 10/01/2015, 8:55 AM  VAD Team --- VAD ISSUES ONLY--- Pager [phone] (7am - 7am)  Advanced Heart Failure Team  Pager [phone] (M-F; 7a - 4p)  Please contact [facility] for night-coverage after hours (4p -7a ) and weekends on [email or web address]   Patient seen and examined with Otilio Saber, PA-C. We discussed all aspects of the encounter. I agree with the assessment and plan as stated above.   Continues to improve. Still with mild confusion at times. I removed CT sutures at bedside. Volume status looks good. INR ok .VAD parameters stable. VAD teaching  reinforced.   Bensimhon, Daniel,MD 11:58 AM

## 2015-10-01 NOTE — Progress Notes (Signed)
Subjective/Complaints: Slept poorly Still constipated  No IV meds, dsicussed with RN   ROS- no CP, SOB, N/V/D  Objective: Vital Signs: Blood pressure (!) 80/60, pulse 67, temperature 98.1 F (36.7 C), temperature source Oral, resp. rate 17, weight 75.7 kg (166 lb 14.4 oz), SpO2 96 %. No results found. Results for orders placed or performed during the hospital encounter of 09/19/15 (from the past 72 hour(s))  Protime-INR     Status: Abnormal   Collection Time: 09/29/15  4:15 AM  Result Value Ref Range   Prothrombin Time 27.7 (H) 11.6 - 15.2 seconds   INR 2.63 (H) 0.00 - 1.49  Lactate dehydrogenase     Status: None   Collection Time: 09/29/15  4:15 AM  Result Value Ref Range   LDH 185 98 - 192 U/L  CBC     Status: Abnormal   Collection Time: 09/29/15  4:15 AM  Result Value Ref Range   WBC 3.3 (L) 4.0 - 10.5 K/uL   RBC 3.16 (L) 4.22 - 5.81 MIL/uL   Hemoglobin 8.2 (L) 13.0 - 17.0 g/dL   HCT 27.1 (L) 39.0 - 52.0 %   MCV 85.8 78.0 - 100.0 fL   MCH 25.9 (L) 26.0 - 34.0 pg   MCHC 30.3 30.0 - 36.0 g/dL   RDW 18.0 (H) 11.5 - 15.5 %   Platelets 114 (L) 150 - 400 K/uL    Comment: REPEATED TO VERIFY CONSISTENT WITH PREVIOUS RESULT   Basic metabolic panel     Status: Abnormal   Collection Time: 09/29/15  4:15 AM  Result Value Ref Range   Sodium 141 135 - 145 mmol/L   Potassium 4.0 3.5 - 5.1 mmol/L   Chloride 108 101 - 111 mmol/L   CO2 29 22 - 32 mmol/L   Glucose, Bld 88 65 - 99 mg/dL   BUN 9 6 - 20 mg/dL   Creatinine, Ser 0.87 0.61 - 1.24 mg/dL   Calcium 8.6 (L) 8.9 - 10.3 mg/dL   GFR calc non Af Amer >60 >60 mL/min   GFR calc Af Amer >60 >60 mL/min    Comment: (NOTE) The eGFR has been calculated using the CKD EPI equation. This calculation has not been validated in all clinical situations. eGFR's persistently <60 mL/min signify possible Chronic Kidney Disease.    Anion gap 4 (L) 5 - 15  Protime-INR     Status: Abnormal   Collection Time: 09/30/15  5:40 AM  Result  Value Ref Range   Prothrombin Time 26.7 (H) 11.6 - 15.2 seconds   INR 2.50 (H) 0.00 - 1.49  Lactate dehydrogenase     Status: Abnormal   Collection Time: 09/30/15  5:40 AM  Result Value Ref Range   LDH 214 (H) 98 - 192 U/L  CBC     Status: Abnormal   Collection Time: 09/30/15  5:40 AM  Result Value Ref Range   WBC 3.5 (L) 4.0 - 10.5 K/uL   RBC 3.31 (L) 4.22 - 5.81 MIL/uL   Hemoglobin 8.4 (L) 13.0 - 17.0 g/dL   HCT 28.4 (L) 39.0 - 52.0 %   MCV 85.8 78.0 - 100.0 fL   MCH 25.4 (L) 26.0 - 34.0 pg   MCHC 29.6 (L) 30.0 - 36.0 g/dL   RDW 18.5 (H) 11.5 - 15.5 %   Platelets 112 (L) 150 - 400 K/uL    Comment: CONSISTENT WITH PREVIOUS RESULT  Basic metabolic panel     Status: Abnormal   Collection Time: 09/30/15  5:40 AM  Result Value Ref Range   Sodium 140 135 - 145 mmol/L   Potassium 4.0 3.5 - 5.1 mmol/L   Chloride 107 101 - 111 mmol/L   CO2 29 22 - 32 mmol/L   Glucose, Bld 98 65 - 99 mg/dL   BUN 12 6 - 20 mg/dL   Creatinine, Ser 0.98 0.61 - 1.24 mg/dL   Calcium 8.5 (L) 8.9 - 10.3 mg/dL   GFR calc non Af Amer >60 >60 mL/min   GFR calc Af Amer >60 >60 mL/min    Comment: (NOTE) The eGFR has been calculated using the CKD EPI equation. This calculation has not been validated in all clinical situations. eGFR's persistently <60 mL/min signify possible Chronic Kidney Disease.    Anion gap 4 (L) 5 - 15  Protime-INR     Status: Abnormal   Collection Time: 10/01/15  4:10 AM  Result Value Ref Range   Prothrombin Time 23.4 (H) 11.6 - 15.2 seconds   INR 2.10 (H) 0.00 - 1.49  Lactate dehydrogenase     Status: Abnormal   Collection Time: 10/01/15  4:10 AM  Result Value Ref Range   LDH 226 (H) 98 - 192 U/L  CBC     Status: Abnormal   Collection Time: 10/01/15  4:10 AM  Result Value Ref Range   WBC 3.3 (L) 4.0 - 10.5 K/uL   RBC 3.36 (L) 4.22 - 5.81 MIL/uL   Hemoglobin 8.5 (L) 13.0 - 17.0 g/dL   HCT 29.0 (L) 39.0 - 52.0 %   MCV 86.3 78.0 - 100.0 fL   MCH 25.3 (L) 26.0 - 34.0 pg   MCHC  29.3 (L) 30.0 - 36.0 g/dL   RDW 18.7 (H) 11.5 - 15.5 %   Platelets 117 (L) 150 - 400 K/uL    Comment: CONSISTENT WITH PREVIOUS RESULT  Basic metabolic panel     Status: Abnormal   Collection Time: 10/01/15  4:10 AM  Result Value Ref Range   Sodium 139 135 - 145 mmol/L   Potassium 4.0 3.5 - 5.1 mmol/L   Chloride 107 101 - 111 mmol/L   CO2 27 22 - 32 mmol/L   Glucose, Bld 98 65 - 99 mg/dL   BUN 15 6 - 20 mg/dL   Creatinine, Ser 0.86 0.61 - 1.24 mg/dL   Calcium 8.5 (L) 8.9 - 10.3 mg/dL   GFR calc non Af Amer >60 >60 mL/min   GFR calc Af Amer >60 >60 mL/min    Comment: (NOTE) The eGFR has been calculated using the CKD EPI equation. This calculation has not been validated in all clinical situations. eGFR's persistently <60 mL/min signify possible Chronic Kidney Disease.    Anion gap 5 5 - 15     HEENT: normal Cardio: LVAD hum Resp: CTA B/L and unlabored GI: NT, ND Extremity:  Pulses positive and No Edema Skin:   PICC site CDI Neuro: Confused and Abnormal Motor 4/5 in BUE and BLE Musc/Skel:  Other no pain with UE or LE ROM Gen NAD   Assessment/Plan: 1. Functional deficits secondary to deconditioning and encephalopathy which require 3+ hours per day of interdisciplinary therapy in a comprehensive inpatient rehab setting. Physiatrist is providing close team supervision and 24 hour management of active medical problems listed below. Physiatrist and rehab team continue to assess barriers to discharge/monitor patient progress toward functional and medical goals. FIM: Function - Bathing Position: Wheelchair/chair at sink Body parts bathed by patient: Right arm, Left arm, Chest, Front perineal  area, Right upper leg, Left upper leg, Abdomen, Buttocks, Right lower leg, Left lower leg Body parts bathed by helper: Back Bathing not applicable: Left lower leg, Right lower leg Assist Level: Supervision or verbal cues, Set up Set up : To obtain items  Function- Upper Body  Dressing/Undressing What is the patient wearing?: Pull over shirt/dress Pull over shirt/dress - Perfomed by patient: Thread/unthread right sleeve, Thread/unthread left sleeve, Put head through opening Pull over shirt/dress - Perfomed by helper: Pull shirt over trunk Assist Level: Touching or steadying assistance(Pt > 75%) Set up : To obtain clothing/put away Function - Lower Body Dressing/Undressing What is the patient wearing?: Pants, Underwear, Shoes, Ted Hose Position: Wheelchair/chair at [employer] - Performed by patient: Thread/unthread right underwear leg, Pull underwear up/down Underwear - Performed by helper: Thread/unthread left underwear leg Pants- Performed by patient: Thread/unthread right pants leg, Thread/unthread left pants leg, Pull pants up/down Pants- Performed by helper: Thread/unthread right pants leg, Thread/unthread left pants leg Non-skid slipper socks- Performed by patient: Don/doff right sock, Don/doff left sock Non-skid slipper socks- Performed by helper: Don/doff right sock, Don/doff left sock Shoes - Performed by patient: Don/doff right shoe, Don/doff left shoe TED Hose - Performed by helper: Don/doff right TED hose, Don/doff left TED hose Assist for footwear: Partial/moderate assist Assist for lower body dressing: Touching or steadying assistance (Pt > 75%) (Mod assist sit > stand)  Function - Toileting Toileting activity did not occur: Refused Toileting steps completed by patient: Adjust clothing after toileting Toileting steps completed by helper: Adjust clothing prior to toileting, Adjust clothing after toileting, Performs perineal hygiene Toileting Assistive Devices: Grab bar or rail Assist level: Supervision or verbal cues, Touching or steadying assistance (Pt.75%), More than reasonable time  Function - Air cabin crew transfer assistive device: Walker Assist level to toilet: Touching or steadying assistance (Pt > 75%) Assist level from  toilet: Touching or steadying assistance (Pt > 75%) Assist level to bedside commode (at bedside): Moderate assist (Pt 50 - 74%/lift or lower) Assist level from bedside commode (at bedside): Moderate assist (Pt 50 - 74%/lift or lower)  Function - Chair/bed transfer Chair/bed transfer method: Ambulatory Chair/bed transfer assist level: Supervision or verbal cues Chair/bed transfer assistive device: Walker Chair/bed transfer details: Verbal cues for technique, Verbal cues for precautions/safety  Function - Locomotion: Wheelchair Type: Manual (bil LE propulsion) Wheelchair activity did not occur: Safety/medical concerns (sternal precautions) Max wheelchair distance: 150 Assist Level: Supervision or verbal cues Wheel 50 feet with 2 turns activity did not occur: Safety/medical concerns Assist Level: Supervision or verbal cues Wheel 150 feet activity did not occur: Safety/medical concerns Assist Level: Supervision or verbal cues Turns around,maneuvers to table,bed, and toilet,negotiates 3% grade,maneuvers on rugs and over doorsills: No Function - Locomotion: Ambulation Assistive device:  (rollator) Max distance: 200 Assist level: Supervision or verbal cues Assist level: Supervision or verbal cues Walk 50 feet with 2 turns activity did not occur: Safety/medical concerns Assist level: Supervision or verbal cues Walk 150 feet activity did not occur: Safety/medical concerns Assist level: Supervision or verbal cues Walk 10 feet on uneven surfaces activity did not occur: Safety/medical concerns  Function - Comprehension Comprehension: Auditory Comprehension assist level: Understands basic 75 - 89% of the time/ requires cueing 10 - 24% of the time  Function - Expression Expression: Verbal Expression assist level: Expresses basic 90% of the time/requires cueing < 10% of the time.  Function - Social Interaction Social Interaction assist level: Interacts appropriately 75 - 89% of the time -  Needs redirection for appropriate language or to initiate interaction.  Function - Problem Solving Problem solving assist level: Solves basic 25 - 49% of the time - needs direction more than half the time to initiate, plan or complete simple activities  Function - Memory Memory assist level: Recognizes or recalls 25 - 49% of the time/requires cueing 50 - 75% of the time Patient normally able to recall (first 3 days only): That he or she is in a hospital, Staff names and faces  1. Hypoxic encephalopathy and Deconditioning secondary to acute on chronic systolic congestive heart failure/recent LVAD 18/40/3754 complicated by left pleural effusion with chest tube Continue CIR PT, OT, SLP Planned d/c 7/26 l 2. DVT Prophylaxis/Anticoagulation: Chronic Coumadin therapy. Monitor for any bleeding episodes 3. Pain Management: Ultram as needed 4. Mood: Remeron increase to 30 mg daily at bedtime,  5. Neuropsych: This patient is capable of making decisions on his own behalf. 6. Skin/Wound Care: Routine skin checks, D/C PICC today 7. Fluids/Electrolytes/Nutrition: Routine I&O's with follow-up chemistries 8. Acute blood loss anemia. Follow-up CBC, Hemoglobins running in the 8 range 9. Hypertension/atrial fibrillation. Amiodarone 200 mg twice a day, Aldactone 25 mg daily. Monitor with increased mobility 10. BPH. Flomax 0.4 mg daily. Check PVR 3 11. Decreased nutritional storage. Follow-up dietary consult, enc protein intake, trial beneprotein, Appreciate dietary f/u 12. Memory deficits, per wife this was not a pre-existing problem.+encephalopathy, probable hypoxic, was having mental status changes prior to presentation with cardiogenic shock.  Slowly improving  13.  Freq urination-  oxybutnin low dose, still up 3 times, Neg UA, increase dose 14.  Constipation, adjust laxatives  LOS (Days) 12 A FACE TO FACE EVALUATION WAS PERFORMED  KIRSTEINS,ANDREW E 10/01/2015, 8:27 AM

## 2015-10-01 NOTE — Progress Notes (Signed)
Occupational Therapy Session Note  Patient Details  Name: [name] MRN: [id number] Date of Birth: December 12, 1940  Today's Date: 10/01/2015 OT Individual Time: 0930-1030 and 6213-0865 OT Individual Time Calculation (min): 60 min and 33 min    Short Term Goals: Week 2:  OT Short Term Goal 1 (Week 2): LTG=STG OT Short Term Goal 2 (Week 2): Family education reguarding cogntion and how to provide cues and 24 hr supervision  Skilled Therapeutic Interventions/Progress Updates:    1) Completed ADL retraining at overall supervision with min cues for sequencing of dressing tasks and mod cues for sternal precautions and safety with transitional movements.  Pt demonstrated improved sequencing and orientation of clothing with dressing requiring increased time and pt able to demonstrate problem solving with donning shoes.  Pt's wife introduced fishing vest to fit LVAD batteries into with pt demonstrating increased confusion regarding how to don and why he needed to wear it "I've never worn one before".  Educated on LVAD purpose and decreased burden of wear of batteries in vest.  Pt's wife present throughout session and verbalized understanding of technique and increased time during familiar, functional tasks.    2) Treatment session with focus on functional mobility and cognitive task.  Pt ambulated to therapy gym with Rollator and supervision/cues for pathfinding.  Engaged in cognitive task while completing sit > stand to increase carryover of sternal precautions while sorting food labeled bean bags in to breakfast, lunch, dinner categories.  Pt demonstrated increased difficulty with sorting task with 50% cues to "flip it over" to locate pictures and pt often stating "I've never eaten that" to familiar foods when asked to sort by meal.  Returned to room with Rollator and left with wife.  Reiterated the importance of pt completing tasks for himself to increase carryover and repetition/similar clothing items  to increase success.  Pt's wife reports ready for d/c and no further questions.    Therapy Documentation Precautions:  Precautions Precautions: Sternal Precaution Comments: LVAD, Call VAD Pager [phone]. Flow less than 2.5, or sustained increase or decrease of 2.0 L Restrictions Weight Bearing Restrictions: No RUE Weight Bearing: Non weight bearing LUE Weight Bearing: Non weight bearing RLE Weight Bearing: Weight bearing as tolerated LLE Weight Bearing: Weight bearing as tolerated Pain:  Pt with no c/o pain  See Function Navigator for Current Functional Status.   Therapy/Group: Individual Therapy  Rosalio Loud 10/01/2015, 10:51 AM

## 2015-10-01 NOTE — Progress Notes (Signed)
Social Work Patient ID: [name], male   DOB: 10/17/1940, 75 y.o.   MRN: [id number]   Met with daughter who reports the rollator from [facility] did not have the big wheels they want for pt to go outside. Gave them a prescription for a rollator rolling walker they plan to get on their own. Daughter to get one for him before discharge tomorrow. Cleared up that the PT they want to use does work for [employer]. Ready for discharge tomorrow.

## 2015-10-02 DIAGNOSIS — I5023 Acute on chronic systolic (congestive) heart failure: Secondary | ICD-10-CM

## 2015-10-02 DIAGNOSIS — E869 Volume depletion, unspecified: Secondary | ICD-10-CM

## 2015-10-02 LAB — CBC
HEMATOCRIT: 28.4 % — AB (ref 39.0–52.0)
HEMOGLOBIN: 8.5 g/dL — AB (ref 13.0–17.0)
MCH: 25.8 pg — ABNORMAL LOW (ref 26.0–34.0)
MCHC: 29.9 g/dL — ABNORMAL LOW (ref 30.0–36.0)
MCV: 86.1 fL (ref 78.0–100.0)
Platelets: 108 10*3/uL — ABNORMAL LOW (ref 150–400)
RBC: 3.3 MIL/uL — AB (ref 4.22–5.81)
RDW: 18.8 % — AB (ref 11.5–15.5)
WBC: 4 10*3/uL (ref 4.0–10.5)

## 2015-10-02 LAB — PROTIME-INR
INR: 1.86
PROTHROMBIN TIME: 21.4 s — AB (ref 11.4–15.2)

## 2015-10-02 LAB — LACTATE DEHYDROGENASE: LDH: 221 U/L — AB (ref 98–192)

## 2015-10-02 MED ORDER — POLYETHYLENE GLYCOL 3350 17 G PO PACK: 17.0000 g | PACK | Freq: Every day | ORAL | 0 refills | Status: DC

## 2015-10-02 MED ORDER — SODIUM CHLORIDE 0.9 % IV BOLUS (SEPSIS)
1000.0000 mL | Freq: Once | INTRAVENOUS | Status: AC
  Administered 2015-10-02: 1000 mL via INTRAVENOUS

## 2015-10-02 MED ORDER — PANTOPRAZOLE SODIUM 40 MG PO TBEC: 40.0000 mg | DELAYED_RELEASE_TABLET | Freq: Every day | ORAL | 0 refills | Status: AC

## 2015-10-02 MED ORDER — WARFARIN SODIUM 3 MG PO TABS: 3.0000 mg | ORAL_TABLET | Freq: Every day | ORAL | 6 refills | Status: DC

## 2015-10-02 MED ORDER — WARFARIN SODIUM 3 MG PO TABS
3.0000 mg | ORAL_TABLET | Freq: Once | ORAL | Status: DC
Start: 2015-10-02 — End: 2015-10-02
  Filled 2015-10-02: qty 1

## 2015-10-02 MED ORDER — TAMSULOSIN HCL 0.4 MG PO CAPS: 0.4000 mg | ORAL_CAPSULE | Freq: Every day | ORAL | 0 refills | Status: DC

## 2015-10-02 MED ORDER — OXYBUTYNIN CHLORIDE 5 MG PO TABS: 5.0000 mg | ORAL_TABLET | Freq: Every day | ORAL | 0 refills | Status: DC

## 2015-10-02 MED ORDER — DOCUSATE SODIUM 100 MG PO CAPS: 200.0000 mg | ORAL_CAPSULE | Freq: Two times a day (BID) | ORAL | 0 refills | Status: DC

## 2015-10-02 MED ORDER — AMIODARONE HCL 200 MG PO TABS: 200.0000 mg | ORAL_TABLET | Freq: Two times a day (BID) | ORAL | 0 refills | Status: DC

## 2015-10-02 MED ORDER — MIRTAZAPINE 30 MG PO TABS: 30.0000 mg | ORAL_TABLET | Freq: Every day | ORAL | 0 refills | Status: DC

## 2015-10-02 MED ORDER — BISACODYL 10 MG RE SUPP: 10.0000 mg | Freq: Every day | RECTAL | 0 refills | Status: DC

## 2015-10-02 MED ORDER — FE FUMARATE-B12-VIT C-FA-IFC PO CAPS: 1.0000 | ORAL_CAPSULE | Freq: Three times a day (TID) | ORAL | 0 refills | Status: DC

## 2015-10-02 NOTE — Progress Notes (Signed)
Patient ID: [name], male   DOB: 1940/12/23, 75 y.o.   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   Ramp echo 7/10 with increase in speed to 9400 rpm. Speed decreased to 9200 09/24/15.  PI events stable at 4-5 per day. (Did have upward of 20 prior to adjustment)  Had episode of N/V last night. Afebrile.  Thought to be related to constipation. Had enema with large stool return. Denies lightheadedness or dizziness. No further N/V since last night.    MAPs 80-90s per chart, may be modified systolic.    Tentative discharge today.   LDH and CBC stable ( low at 8.5 though stable) INR 1.86 this am.   LVAD INTERROGATION:  HeartMate II LVAD:  Flow 3.8 liters/min, speed 9200, power 4.8, PI 2.2.  < 10 PI events daily.    Objective:    Vital Signs:   Temp:  [98.6 F (37 C)] 98.6 F (37 C) (07/26 0545) Pulse Rate:  [65] 65 (07/26 0545) Resp:  [17] 17 (07/26 0545) SpO2:  [98 %] 98 % (07/26 0545) Weight:  [166 lb 14.2 oz (75.7 kg)] 166 lb 14.2 oz (75.7 kg) (07/26 0545) Last BM Date: 10/01/15 Mean arterial Pressure 60-80s  Intake/Output:   Intake/Output Summary (Last 24 hours) at 10/02/15 0841 Last data filed at 10/01/15 1347  Gross per 24 hour  Intake              240 ml  Output              200 ml  Net               40 ml     Physical Exam: General: NAD. Lying in bed HEENT: Normal Neck: Supple. JVP not elevated Cor: LVAD hum.  Lungs: Clear, normal effort Abdomen: soft, NT, ND, no HSM. No bruits or masses. +BS  Driveline site ok. Securement device in place.   Extremities: no cyanosis, clubbing, rash.  TED hose on. No edema  Neuro: Alert and oriented to person and place  Labs: Basic Metabolic Panel:  Recent Labs Lab 09/26/15 1225 09/27/15 0355 09/28/15 0415 09/29/15 0415 09/30/15 0540 10/01/15 0410  NA 139 141 142 141 140 139  K 3.9 4.4 4.2 4.0 4.0  4.0  CL 108 108 109 108 107 107  CO2 25 28 29 29 29 27   GLUCOSE 143* 94 83 88 98 98  BUN 12 11 9 9 12 15   CREATININE 1.00 0.87 0.91 0.87 0.98 0.86  CALCIUM 8.7* 8.5* 8.8* 8.6* 8.5* 8.5*  MG 2.3  --   --   --   --   --     Liver Function Tests: No results for input(s): AST, ALT, ALKPHOS, BILITOT, PROT, ALBUMIN in the last 168 hours. No results for input(s): LIPASE, AMYLASE in the last 168 hours. No results for input(s): AMMONIA in the last 168 hours.  CBC:  Recent Labs Lab 09/28/15 0415 09/29/15 0415 09/30/15 0540 10/01/15 0410 10/02/15 0736  WBC 3.5* 3.3* 3.5* 3.3* 4.0  HGB 8.5* 8.2* 8.4* 8.5* 8.5*  HCT 28.7* 27.1* 28.4* 29.0* 28.4*  MCV 86.4 85.8 85.8 86.3 86.1  PLT 135* 114* 112* 117* 108*    INR:  Recent Labs Lab 09/27/15 0355 09/28/15 0415 09/29/15 0415 09/30/15 0540 10/01/15 0410  INR 2.02* 2.44* 2.63* 2.50* 2.10*    Other  results:  EKG:   Imaging: No results found.   Medications:     Scheduled Medications: . amiodarone  200 mg Oral BID  . aspirin EC  81 mg Oral Daily  . bisacodyl  10 mg Oral Daily   Or  . bisacodyl  10 mg Rectal Daily  . docusate sodium  200 mg Oral BID  . feeding supplement (ENSURE ENLIVE)  237 mL Oral BID BM  . ferrous fumarate-b12-vitamic C-folic acid  1 capsule Oral TID PC  . mirtazapine  30 mg Oral QHS  . oxybutynin  5 mg Oral QHS  . pantoprazole  40 mg Oral Daily  . polyethylene glycol  17 g Oral Daily  . protein supplement  1 scoop Oral TID WC  . sodium chloride flush  10-40 mL Intracatheter Q12H  . tamsulosin  0.4 mg Oral Daily  . Warfarin - Pharmacist Dosing Inpatient   Does not apply q1800    Infusions:    PRN Medications: acetaminophen, magnesium hydroxide, ondansetron **OR** ondansetron (ZOFRAN) IV, polyvinyl alcohol, sodium chloride flush, sorbitol, traMADol   Assessment:   1. 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 EF 15%. Cath 8/14 with normal cors. - HM II LVAD  placed 6/23.  2. Frequent PVCs 3. Severe malnutrition- Prealbumin 14.7 on 6/20 4. Acute post-op delirium/sundowning 5. Urinary Retention requiring Foley 6. Left pleural effusion s/p chest tube  Plan/Discussion:    Has improved with his work with CIR. Appetite still comes and goes.   Pharmacy dosing coumadin, goal INR 2-2.5.  INR 1.86 today. Hemoglobin stable at 8.5,  no bleeding noted.    Per pharmacy home coumadin dose to be 3 mg daily. Will need INR check Monday 10/07/15 per [facility].   Recorded MAPs for the most part may be modified systolic with patients chronically low BP.   PICC line and CT sutures removed 10/01/15.   Isolated power spike noted (10.4) week of 09/18/15. Event log was reviewed thought to be pump burn in.  LDH relatively stable.    PIs low in the 2-3 range this am, have been running low in am with N/V last night. MAPs on our check this am in 60s.  Will give 1 L bolus of NS prior to d/c.   We appreciate the work and input CIR have put into his care.   He has follow up scheduled for the HF clinic next week.   HF meds for home.  Coumadin 3 mg daily - Goal INR 2 - 2.5 Amiodarone 200 mg BID ASA 81 mg daily  I reviewed the LVAD parameters from today, and compared the results to the patient's prior recorded data.  No programming changes were made.  The LVAD is functioning within specified parameters.  The patient performs LVAD self-test daily.  LVAD interrogation was negative for any significant power changes, alarms or PI events/speed drops.  LVAD equipment check completed and is in good working order.  Back-up equipment present.   LVAD education done on emergency procedures and precautions and reviewed exit site care.  Length of Stay: [address]  Graciella Freer PA-C 10/02/2015, 8:41 AM  VAD Team --- VAD ISSUES ONLY--- Pager [phone] (7am - 7am)  Advanced Heart Failure Team  Pager [phone] (M-F; 7a - 4p)  Please contact [facility] for night-coverage after hours  (4p -7a ) and weekends on [email or web address]  Patient seen and examined with Otilio Saber, PA-C. We discussed all aspects of the encounter. I agree with the  assessment and plan as stated above.   He is volume depleted after enema and large BM. PI is down. Will give 1L NS and I decreased VAD speed to 9000. Will d/c today with close f/u in VAD Clinic. Start miralax to help with constipation. Encouraged good po intake. Appreciate CIR care.   Bensimhon, Daniel,MD 11:54 AM

## 2015-10-02 NOTE — Progress Notes (Signed)
Patient discharged to home accompanied by his wife and daughter. 

## 2015-10-02 NOTE — Progress Notes (Signed)
Social Work  Discharge Note  The overall goal for the admission was met for:   Discharge location: Yes-HOME WITH WIFE WHO CAN PROVIDE 24 HR SUPERVISION  Length of Stay: Yes-13 DAYS  Discharge activity level: Yes-SUPERVISION LEVEL DUE TO COGNITION  Home/community participation: Yes  Services provided included: MD, RD, PT, OT, SLP, RN, CM, Pharmacy, Neuropsych and SW  Financial Services: Medicare and Private Insurance: [place]  Follow-up services arranged: Home Health: [facility]-PT,OT,RN,SP, DME: [facility]-BEDSIDE COMMODE-TO GET ROLLATOR ON THEIR OWN and Patient/Family request agency HH: [facility], DME: [facility]  Comments (or additional information):WIFE AND DAUGHTER'S WHERE HERE TO LEARN EDUCATION ON LVAD AND PT'S [place].   Patient/Family verbalized understanding of follow-up arrangements: Yes  Individual responsible for coordination of the follow-up plan: JEAN-WIFE & RHONDA-DAUGHTER  Confirmed correct DME delivered: Elease Hashimoto 10/02/2015    Elease Hashimoto

## 2015-10-02 NOTE — Discharge Instructions (Signed)
Inpatient Rehab Discharge Instructions  [name] Discharge date and time: No discharge date for patient encounter.   Activities/Precautions/ Functional Status: Activity: activity as tolerated and no lifting, driving, or strenuous exercise till cleared by MD Diet: regular diet Wound Care: Per LVAD protocol    Functional status:  ___ No restrictions     ___ Walk up steps independently _X__ 24/7 supervision/assistance   ___ Walk up steps with assistance ___ Intermittent supervision/assistance  ___ Bathe/dress independently ___ Walk with walker     _x__ Bathe/dress with assistance ___ Walk Independently    ___ Shower independently ___ Walk with assistance    ___ Shower with assistance _X__ No alcohol     ___ Return to work/school ________  Special Instructions:  1. Patient should have weekly prothrombin times while on Coumadin followed by [facility] [phone] fax number [phone]  2. Family to take away car keys and Lock up guns or other items of self harm.   COMMUNITY REFERRALS UPON DISCHARGE:    Home Health:   PT, OT, SP, RN  Agency:[facility] [phone]   Date of last service:10/02/2015  Medical Equipment/Items Ordered:BEDSIDE COMMODE & ROLLATOR [facility]  Agency/Supplier:[facility]   [phone] OTHER: [facility]-DR ADAM MCDERMOTT [phone] FOLLOW UP COGNITIVE TESTING   GENERAL COMMUNITY RESOURCES FOR PATIENT/FAMILY: Support Groups:ENDURING HEART[facility] LVAD SUPPORT GROUP CONTACT Lasandra Beech [phone] FOR MORE INFORMATION   My questions have been answered and I understand these instructions. I will adhere to these goals and the provided educational materials after my discharge from the hospital.  Patient/Caregiver Signature _______________________________ Date __________  Clinician Signature _______________________________________ Date __________  Please bring this form and your medication list  with you to all your follow-up doctor's appointments.

## 2015-10-02 NOTE — Progress Notes (Signed)
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 Implant hospitalization was from 08/22/15 - 09/19/15.   Vital signs: Temo: 98.6 HR: 65 Doppler MAP: 64  Wt in lbs:  179 > 170 > 168 > 167 > 161 > 157 > 165 > 165 > 161 > 166lbs   LVAD interrogation reveals:  Speed: 9200 Flow:4.0 Power: 4.9w PI: 2.5 Alarms: none Events: 0 PI events  Fixed speed:  9200 Low speed limit: 8600  Pt continues to have low PI. Pt reports vomiting 3 or 4 times last night. Pt will receive 1L bolus and we will decrease speed to 9000 per Dr. Gala Romney. Backup controller programmed to reflect 9000/8400.  After decreasing speed to 9000: LVAD interrogation reveals:  Speed: 9000 Flow:4.0 Power: 5w PI: 4.6  Drive Line:  C/D/I. Daily dressing changes using guaze dressing with Aquacel silver strip on exit site being maintained per wife. Dressing supplies at bedside. Pt given 8 daily dressing kits, 4 aquaseal packages and 3 foley anchors for home use.   Labs:  LDH trend:  196 > 266 > 188 > 190 > 182 > 181 > 214  > 221  INR trend: 2.12 > 2.47 > 3.0 > 2.95 > 2.45 > 2.0 > 1.0 > 2.02 > 2.5 > 1.86  Hgb:  8.5 > 9.0 > 8.3 > 8.4 > 8.3 > 8.8 > 8.4 > 8.5  Antithrombotic Management:   INR goal: 2.0 - 2.5 ASA 81 mg    Plan/Recommendations:  1. Decreased speed to 9000/8400. 2. Receive 1 L bolus. 3. D/C home. Pt has received all equipment and dressing supplies for home use. Pt has f/u scheduled for next week. [facility] will check pts INR on Monday.    Carlton Adam, RN VAD Coordinator  Office: [phone] 24/7 Emergency VAD Pager: [phone]

## 2015-10-02 NOTE — Progress Notes (Signed)
Subjective/Complaints: Became nauseated and vomited last noc, received several laxatives yesterday, finally had BM    ROS- no CP, SOB, N/V/resolved  Objective: Vital Signs: Blood pressure (!) 80/60, pulse 65, temperature 98.6 F (37 C), temperature source Oral, resp. rate 17, weight 75.7 kg (166 lb 14.2 oz), SpO2 98 %. No results found. Results for orders placed or performed during the hospital encounter of 09/19/15 (from the past 72 hour(s))  Protime-INR     Status: Abnormal   Collection Time: 09/30/15  5:40 AM  Result Value Ref Range   Prothrombin Time 26.7 (H) 11.6 - 15.2 seconds   INR 2.50 (H) 0.00 - 1.49  Lactate dehydrogenase     Status: Abnormal   Collection Time: 09/30/15  5:40 AM  Result Value Ref Range   LDH 214 (H) 98 - 192 U/L  CBC     Status: Abnormal   Collection Time: 09/30/15  5:40 AM  Result Value Ref Range   WBC 3.5 (L) 4.0 - 10.5 K/uL   RBC 3.31 (L) 4.22 - 5.81 MIL/uL   Hemoglobin 8.4 (L) 13.0 - 17.0 g/dL   HCT 28.4 (L) 39.0 - 52.0 %   MCV 85.8 78.0 - 100.0 fL   MCH 25.4 (L) 26.0 - 34.0 pg   MCHC 29.6 (L) 30.0 - 36.0 g/dL   RDW 18.5 (H) 11.5 - 15.5 %   Platelets 112 (L) 150 - 400 K/uL    Comment: CONSISTENT WITH PREVIOUS RESULT  Basic metabolic panel     Status: Abnormal   Collection Time: 09/30/15  5:40 AM  Result Value Ref Range   Sodium 140 135 - 145 mmol/L   Potassium 4.0 3.5 - 5.1 mmol/L   Chloride 107 101 - 111 mmol/L   CO2 29 22 - 32 mmol/L   Glucose, Bld 98 65 - 99 mg/dL   BUN 12 6 - 20 mg/dL   Creatinine, Ser 0.98 0.61 - 1.24 mg/dL   Calcium 8.5 (L) 8.9 - 10.3 mg/dL   GFR calc non Af Amer >60 >60 mL/min   GFR calc Af Amer >60 >60 mL/min    Comment: (NOTE) The eGFR has been calculated using the CKD EPI equation. This calculation has not been validated in all clinical situations. eGFR's persistently <60 mL/min signify possible Chronic Kidney Disease.    Anion gap 4 (L) 5 - 15  Protime-INR     Status: Abnormal   Collection Time:  10/01/15  4:10 AM  Result Value Ref Range   Prothrombin Time 23.4 (H) 11.6 - 15.2 seconds   INR 2.10 (H) 0.00 - 1.49  Lactate dehydrogenase     Status: Abnormal   Collection Time: 10/01/15  4:10 AM  Result Value Ref Range   LDH 226 (H) 98 - 192 U/L  CBC     Status: Abnormal   Collection Time: 10/01/15  4:10 AM  Result Value Ref Range   WBC 3.3 (L) 4.0 - 10.5 K/uL   RBC 3.36 (L) 4.22 - 5.81 MIL/uL   Hemoglobin 8.5 (L) 13.0 - 17.0 g/dL   HCT 29.0 (L) 39.0 - 52.0 %   MCV 86.3 78.0 - 100.0 fL   MCH 25.3 (L) 26.0 - 34.0 pg   MCHC 29.3 (L) 30.0 - 36.0 g/dL   RDW 18.7 (H) 11.5 - 15.5 %   Platelets 117 (L) 150 - 400 K/uL    Comment: CONSISTENT WITH PREVIOUS RESULT  Basic metabolic panel     Status: Abnormal   Collection Time: 10/01/15  4:10 AM  Result Value Ref Range   Sodium 139 135 - 145 mmol/L   Potassium 4.0 3.5 - 5.1 mmol/L   Chloride 107 101 - 111 mmol/L   CO2 27 22 - 32 mmol/L   Glucose, Bld 98 65 - 99 mg/dL   BUN 15 6 - 20 mg/dL   Creatinine, Ser 0.86 0.61 - 1.24 mg/dL   Calcium 8.5 (L) 8.9 - 10.3 mg/dL   GFR calc non Af Amer >60 >60 mL/min   GFR calc Af Amer >60 >60 mL/min    Comment: (NOTE) The eGFR has been calculated using the CKD EPI equation. This calculation has not been validated in all clinical situations. eGFR's persistently <60 mL/min signify possible Chronic Kidney Disease.    Anion gap 5 5 - 15  Protime-INR     Status: Abnormal   Collection Time: 10/02/15  7:36 AM  Result Value Ref Range   Prothrombin Time 21.4 (H) 11.4 - 15.2 seconds   INR 1.86   CBC     Status: Abnormal   Collection Time: 10/02/15  7:36 AM  Result Value Ref Range   WBC 4.0 4.0 - 10.5 K/uL   RBC 3.30 (L) 4.22 - 5.81 MIL/uL   Hemoglobin 8.5 (L) 13.0 - 17.0 g/dL   HCT 28.4 (L) 39.0 - 52.0 %   MCV 86.1 78.0 - 100.0 fL   MCH 25.8 (L) 26.0 - 34.0 pg   MCHC 29.9 (L) 30.0 - 36.0 g/dL   RDW 18.8 (H) 11.5 - 15.5 %   Platelets 108 (L) 150 - 400 K/uL    Comment: CONSISTENT WITH PREVIOUS  RESULT     HEENT: normal Cardio: LVAD hum Resp: CTA B/L and unlabored GI: NT, ND Extremity:  Pulses positive and No Edema Skin:   PICC site CDI Neuro: Confused and Abnormal Motor 4/5 in BUE and BLE Musc/Skel:  Other no pain with UE or LE ROM Gen NAD   Assessment/Plan: 1. 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 more prolonged rehab course No driving See D/C summary See D/C instructions FIM: Function - Bathing Position: Wheelchair/chair at sink Body parts bathed by patient: Right arm, Left arm, Chest, Front perineal area, Right upper leg, Left upper leg, Abdomen, Buttocks, Right lower leg, Left lower leg Body parts bathed by helper: Back Bathing not applicable: Left lower leg, Right lower leg Assist Level: Supervision or verbal cues, Set up Set up : To obtain items  Function- Upper Body Dressing/Undressing What is the patient wearing?: Pull over shirt/dress Pull over shirt/dress - Perfomed by patient: Thread/unthread right sleeve, Thread/unthread left sleeve, Put head through opening, Pull shirt over trunk Pull over shirt/dress - Perfomed by helper: Pull shirt over trunk Assist Level: Touching or steadying assistance(Pt > 75%) Set up : To obtain clothing/put away Function - Lower Body Dressing/Undressing What is the patient wearing?: Pants, Underwear, Shoes, [employer], Socks Position: Education officer, museum at [employer] - Performed by patient: Thread/unthread right underwear leg, Pull underwear up/down, Thread/unthread left underwear leg Underwear - Performed by helper: Thread/unthread left underwear leg Pants- Performed by patient: Thread/unthread right pants leg, Thread/unthread left pants leg, Pull pants up/down Pants- Performed by helper: Thread/unthread right pants leg, Thread/unthread left pants leg Non-skid  slipper socks- Performed by patient: Don/doff right sock, Don/doff left sock (doffed) Non-skid slipper socks- Performed by helper:  Don/doff right sock, Don/doff left sock Socks - Performed by patient: Don/doff right sock, Don/doff left sock Shoes - Performed by patient: Don/doff right shoe, Don/doff left shoe TED Hose - Performed by helper: Don/doff right TED hose, Don/doff left TED hose Assist for footwear: Partial/moderate assist (setup and cues for donning socks and shoes, assist to don TEDS) Assist for lower body dressing: Supervision or verbal cues  Function - Toileting Toileting activity did not occur: Refused Toileting steps completed by patient: Adjust clothing after toileting Toileting steps completed by helper: Adjust clothing prior to toileting, Performs perineal hygiene Toileting Assistive Devices: Grab bar or rail Assist level: Set up/obtain supplies, Supervision or verbal cues  Function - Air cabin crew transfer assistive device: Walker, Elevated toilet seat/BSC over toilet Assist level to toilet: Supervision or verbal cues Assist level from toilet: Supervision or verbal cues Assist level to bedside commode (at bedside): Moderate assist (Pt 50 - 74%/lift or lower) Assist level from bedside commode (at bedside): Moderate assist (Pt 50 - 74%/lift or lower)  Function - Chair/bed transfer Chair/bed transfer method: Ambulatory Chair/bed transfer assist level: Supervision or verbal cues Chair/bed transfer assistive device: Walker Chair/bed transfer details: Verbal cues for technique, Verbal cues for precautions/safety  Function - Locomotion: Wheelchair Will patient use wheelchair at discharge?: No Type: Manual (bil LE propulsion) Wheelchair activity did not occur: Safety/medical concerns (sternal precautions) Max wheelchair distance: 150 Assist Level: Supervision or verbal cues Wheel 50 feet with 2 turns activity did not occur: Safety/medical concerns Assist Level:  Supervision or verbal cues Wheel 150 feet activity did not occur: Safety/medical concerns Assist Level: Supervision or verbal cues Turns around,maneuvers to table,bed, and toilet,negotiates 3% grade,maneuvers on rugs and over doorsills: No Function - Locomotion: Ambulation Assistive device: Other (comment) (rollator) Max distance: 250 Assist level: Supervision or verbal cues Assist level: Supervision or verbal cues Walk 50 feet with 2 turns activity did not occur: Safety/medical concerns Assist level: Supervision or verbal cues Walk 150 feet activity did not occur: Safety/medical concerns Assist level: Supervision or verbal cues Walk 10 feet on uneven surfaces activity did not occur: Safety/medical concerns Assist level: Supervision or verbal cues  Function - Comprehension Comprehension: Auditory Comprehension assist level: Understands basic 75 - 89% of the time/ requires cueing 10 - 24% of the time  Function - Expression Expression: Verbal Expression assist level: Expresses basic 75 - 89% of the time/requires cueing 10 - 24% of the time. Needs helper to occlude trach/needs to repeat words.  Function - Social Interaction Social Interaction assist level: Interacts appropriately 75 - 89% of the time - Needs redirection for appropriate language or to initiate interaction.  Function - Problem Solving Problem solving assist level: Solves basic 50 - 74% of the time/requires cueing 25 - 49% of the time  Function - Memory Memory assist level: Recognizes or recalls 25 - 49% of the time/requires cueing 50 - 75% of the time Patient normally able to recall (first 3 days only): Current season, Staff names and faces, That he or she is in a hospital  1. Hypoxic encephalopathy and Deconditioning secondary to acute on chronic systolic congestive heart failure/recent LVAD 31/49/7026 complicated by left pleural effusion with chest tube  Planned d/c today 2. DVT Prophylaxis/Anticoagulation: Chronic  Coumadin therapy. Monitor for any bleeding episodes 3. Pain Management: Ultram as needed 4. Mood: Remeron increase to 30 mg daily at bedtime,  5. Neuropsych: This patient is capable of making decisions on his own behalf. 6. Skin/Wound Care: Routine skin checks,  7. Fluids/Electrolytes/Nutrition: Routine I&O's with follow-up chemistries 8. Acute blood loss anemia. Follow-up CBC, Hemoglobins running in the 8 range 9. Hypertension/atrial fibrillation. Amiodarone 200 mg twice a day, Aldactone 25 mg daily. Monitor with increased mobility 10. BPH. Flomax 0.4 mg daily. Check PVR 3 11. Decreased nutritional storage. Follow-up dietary consult, enc protein intake, trial beneprotein, Appreciate dietary f/u 12. Memory deficits, per wife this was not a pre-existing problem.+encephalopathy, probable hypoxic, was having mental status changes prior to presentation with cardiogenic shock.  Slowly improving  13.  Freq urination-  oxybutnin helpful 14.  Constipation, adjust laxatives  LOS (Days) 13 A FACE TO FACE EVALUATION WAS PERFORMED  Cael Worth E 10/02/2015, 8:48 AM

## 2015-10-02 NOTE — Progress Notes (Signed)
ANTICOAGULATION CONSULT NOTE - Follow Up Consult  Pharmacy Consult for Coumadin Indication: LVAD  Allergies  Allergen Reactions  . Phenergan [Promethazine Hcl] Nausea And Vomiting  . Lasix [Furosemide] Other (See Comments)    "Dropped blood pressure too low"  . Morphine And Related Other (See Comments)    Goes crazy     Patient Measurements: Weight: 166 lb 14.2 oz (75.7 kg) (one pillow, sheet, and blanket)  Vital Signs: Temp: 98.6 F (37 C) (07/26 0545) Temp Source: Oral (07/26 0545) Pulse Rate: 65 (07/26 0545)  Labs:  Recent Labs  09/30/15 0540 10/01/15 0410  HGB 8.4* 8.5*  HCT 28.4* 29.0*  PLT 112* 117*  LABPROT 26.7* 23.4*  INR 2.50* 2.10*  CREATININE 0.98 0.86    Estimated Creatinine Clearance: 81.9 mL/min (by C-G formula based on SCr of 0.86 mg/dL).  Assessment: 33 yom s/p LVAD 6/23 continues on coumadin (started low dose per MD on 6/24, pharmacy dosing starting 7/3). He continues on po amiodarone. CBC stable. No bleeding. Appetite waxing and waning  INR today 2.1.   Goal of Therapy:  INR 2-2.5 Monitor platelets by anticoagulation protocol: yes   Plan:  1) Coumadin 3mg  daily for discharge 2) Daily INR  Sheppard Coil PharmD., BCPS Clinical Pharmacist Pager [phone] 10/02/2015 7:32 AM

## 2015-10-04 ENCOUNTER — Encounter (HOSPITAL_COMMUNITY): Payer: Self-pay | Admitting: Unknown Physician Specialty

## 2015-10-06 NOTE — Discharge Summary (Signed)
Discharge summary job # [id number]

## 2015-10-06 NOTE — Discharge Summary (Signed)
[name], [name] NO.:  [id number]  MEDICAL RECORD NO.:  [id number]  LOCATION:  4W20C                        FACILITY:  [facility]  PHYSICIAN:  Erick Colace, M.D.DATE OF BIRTH:  1940-07-14  DATE OF ADMISSION:  09/19/2015 DATE OF DISCHARGE:  10/02/2015                              DISCHARGE SUMMARY   DISCHARGE DIAGNOSES: 1. 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 2. Chronic Coumadin therapy. 3. Pain management. 4. Depression. 5. Acute blood loss anemia. 6. Hypertension with atrial fibrillation. 7. Benign prostatic hypertrophy. 8. Constipation, resolved.  HISTORY OF PRESENT ILLNESS:  This is a 75 year old right-handed male with history of cardiomyopathy.  He lives with his wife, independent prior to admission.  Presented August 11, 2015 through the Heart Failure Clinic with dizziness, low volume output.  The patient with history of low ejection fraction, nonischemic cardiomyopathy.  Found to be in cardiogenic shock.  Workup per Cardiology Services.  Underwent LVAD, August 30, 2015, per Dr. Donata Clay.  Slowly extubated.  He did have a chest tube for a pleural effusion which drained 1650 mL.  Bouts of confusion.  Agitation slowly improved.  Placed on Remeron and Celexa for mood stabilization.  Maintained on Coumadin therapy.  Acute blood loss anemia and monitored.  The patient was admitted for a comprehensive rehab program.  PAST MEDICAL HISTORY:  See discharge diagnoses.  SOCIAL HISTORY:  Married.  Independent prior to admission.  Functional status upon admission to rehab services was minimal assist for stand pivot transfers, max assist sit to supine; min to mod assist activities of daily living, ambulating minimal guard with a four wheeled walker.  REHABILITATION HOSPITAL COURSE:  The patient was admitted to inpatient rehab services with therapies initiated on a 3-hour daily  basis, consisting of physical therapy, occupational therapy, speech therapy, and rehabilitation nursing.  The following issues were addressed during the patient's rehabilitation stay.  Pertaining to [name]'s hypoxic encephalopathy complicated by acute on chronic systolic congestive heart failure with LVAD, August 30, 2015 he continued to progress nicely throughout his rehab stay.  He would follow up with the Heart Failure Clinic.  He remained on chronic Coumadin therapy.  Had no bleeding episodes.  To be followed at the Coumadin Clinic.  Pain management with the use of Ultram as needed.  He remained on low-dose Remeron for mood stabilization.  Blood pressure and heart rate controlled on amiodarone as well as Aldactone.  Diet slowly improved on a regular consistency diet.  Memory deficits.  Some of this was possibly pre-existing, however his wife said the patient was very independent prior to admission.  Continued to improve nicely.  The patient received weekly collaborative interdisciplinary team conferences to discuss estimated length of stay, family teaching, any barriers to discharge. He was ambulating extended household functional distances to and from his room for therapies, gathering belongings for activities of daily living and homemaking at a supervision level.  Continued to require min mod verbal cues for sequencing and some memory as well as recall.  It was discussed the need for 24-hour supervision for his safety as well as no driving.  He received  followup by neuropsychology on his cognitive deficits and discussed with wife on issues of orientation to time, place, attention, concentration, and processing speed.  The patient discharged to home.  DISCHARGE MEDICATIONS:  At time of dictation included: 1. Amiodarone 200 mg p.o. b.i.d. 2. Aspirin 81 mg p.o. daily. 3. Colace 100 mg 2 capsules twice daily, hold for loose stools. 4. Vitamin B12. 5. Trinsicon t.i.d. 6.  Remeron 30 mg p.o. at bedtime. 7. Ditropan 5 mg p.o. at bedtime. 8. Protonix 40 mg p.o. daily. 9. MiraLAX daily, hold for loose stool. 10.Flomax 0.4 mg p.o. daily. 11.Coumadin 3 mg daily adjusted accordingly as per Coumadin Clinic.  FOLLOWUP:  The patient would follow up with Dr. Claudette Laws as needed; Dr. Kathlee Nations Trigt, 2 weeks call for appointment;  [facility], Dr. Arvilla Meres, August 3; Dr. Donzetta Sprung, Medical Management.     [name], P.A.   ______________________________ Erick Colace, M.D.    DA/MEDQ  D:  10/06/2015  T:  10/06/2015  Job:  [id number]  cc:   Erick Colace, M.D. Bevelyn Buckles. Bensimhon, MD

## 2015-10-07 ENCOUNTER — Ambulatory Visit (HOSPITAL_COMMUNITY): Payer: Self-pay | Admitting: Infectious Diseases

## 2015-10-07 DIAGNOSIS — Z95811 Presence of heart assist device: Secondary | ICD-10-CM

## 2015-10-07 LAB — POCT INR: INR: 1.5

## 2015-10-08 ENCOUNTER — Telehealth (HOSPITAL_COMMUNITY): Payer: Self-pay | Admitting: Infectious Diseases

## 2015-10-08 NOTE — Telephone Encounter (Signed)
Called and LVM requesting CB for Mr. Brodman's home health RN to update about coumadin instructions and dosing with INR being subtherapeutic yesterday.

## 2015-10-08 NOTE — Telephone Encounter (Signed)
Paged VAD pager to report that Mr. Fickert has fallen at home. Was a witnessed and assisted fall in his living room; fell between the couch and daughter Bjorn Loser. Hit his left buttock on the floor while attempting to put shoes on. They are certain he did not injure his head. Reports that he "hurts all over but especially his heels." Currently he is sitting on the ground waiting for some male family members to assist him up.   Explained that the biggest concern for falling while on blood thinners is a head injury. Exacerbated bleeding is always an option as well. Reported he may have more bruising that is expected for the injury and to apply ice to the area 15 min at a time.   INR only 1.5 today - we were going to put him on 0.5mg /kg/dose of lovenox to treat with coumadin bolus however with recent fall D/W Dr. Shirlee Latch and Dr. Donata Clay and will defer the lovenox and go only with coumadin bolus. He is in clinic Thursday for re-check of INR.   Carney Bern is also asking about TED hose stockings - he is up walking around much more than while inpatient and I advised with him being on blood thinners and ambulatory he no longer needs these. Will discuss further in clinic Thursday with MD.   [name], RN VAD Coordinator   Office: [phone] 24/7 Emergency VAD Pager: [phone]

## 2015-10-09 ENCOUNTER — Other Ambulatory Visit (HOSPITAL_COMMUNITY): Payer: Self-pay | Admitting: *Deleted

## 2015-10-09 DIAGNOSIS — Z7901 Long term (current) use of anticoagulants: Secondary | ICD-10-CM

## 2015-10-09 DIAGNOSIS — Z95811 Presence of heart assist device: Secondary | ICD-10-CM

## 2015-10-10 ENCOUNTER — Ambulatory Visit (HOSPITAL_COMMUNITY)
Admit: 2015-10-10 | Discharge: 2015-10-10 | Disposition: A | Discharge status: Home / Self Care | Payer: Medicare Other | Attending: Internal Medicine | Admitting: Internal Medicine

## 2015-10-10 ENCOUNTER — Other Ambulatory Visit (HOSPITAL_COMMUNITY): Payer: Self-pay | Admitting: Unknown Physician Specialty

## 2015-10-10 ENCOUNTER — Ambulatory Visit (HOSPITAL_COMMUNITY): Payer: Self-pay | Admitting: Unknown Physician Specialty

## 2015-10-10 VITALS — BP 106/68 | HR 61 | Ht 74.0 in | Wt 181.0 lb

## 2015-10-10 DIAGNOSIS — Z7901 Long term (current) use of anticoagulants: Secondary | ICD-10-CM | POA: Insufficient documentation

## 2015-10-10 DIAGNOSIS — I429 Cardiomyopathy, unspecified: Secondary | ICD-10-CM | POA: Diagnosis not present

## 2015-10-10 DIAGNOSIS — Z7982 Long term (current) use of aspirin: Secondary | ICD-10-CM | POA: Insufficient documentation

## 2015-10-10 DIAGNOSIS — M1990 Unspecified osteoarthritis, unspecified site: Secondary | ICD-10-CM | POA: Insufficient documentation

## 2015-10-10 DIAGNOSIS — I493 Ventricular premature depolarization: Secondary | ICD-10-CM | POA: Diagnosis not present

## 2015-10-10 DIAGNOSIS — K219 Gastro-esophageal reflux disease without esophagitis: Secondary | ICD-10-CM | POA: Insufficient documentation

## 2015-10-10 DIAGNOSIS — I5022 Chronic systolic (congestive) heart failure: Secondary | ICD-10-CM | POA: Diagnosis not present

## 2015-10-10 DIAGNOSIS — Z95811 Presence of heart assist device: Secondary | ICD-10-CM

## 2015-10-10 LAB — COMPREHENSIVE METABOLIC PANEL
ALK PHOS: 60 U/L (ref 38–126)
ALT: 16 U/L — AB (ref 17–63)
ANION GAP: 6 (ref 5–15)
AST: 20 U/L (ref 15–41)
Albumin: 3.2 g/dL — ABNORMAL LOW (ref 3.5–5.0)
BILIRUBIN TOTAL: 0.6 mg/dL (ref 0.3–1.2)
BUN: 8 mg/dL (ref 6–20)
CALCIUM: 8.8 mg/dL — AB (ref 8.9–10.3)
CO2: 26 mmol/L (ref 22–32)
CREATININE: 0.97 mg/dL (ref 0.61–1.24)
Chloride: 107 mmol/L (ref 101–111)
GFR calc non Af Amer: >60 mL/min (ref >60)
Glucose, Bld: 99 mg/dL (ref 65–99)
Potassium: 3.9 mmol/L (ref 3.5–5.1)
SODIUM: 139 mmol/L (ref 135–145)
TOTAL PROTEIN: 6 g/dL — AB (ref 6.5–8.1)

## 2015-10-10 LAB — CBC
HCT: 31.7 % — ABNORMAL LOW (ref 39.0–52.0)
HEMOGLOBIN: 9.3 g/dL — AB (ref 13.0–17.0)
MCH: 25.8 pg — AB (ref 26.0–34.0)
MCHC: 29.3 g/dL — AB (ref 30.0–36.0)
MCV: 88.1 fL (ref 78.0–100.0)
PLATELETS: 104 10*3/uL — AB (ref 150–400)
RBC: 3.6 MIL/uL — AB (ref 4.22–5.81)
RDW: 18.9 % — ABNORMAL HIGH (ref 11.5–15.5)
WBC: 3.2 10*3/uL — ABNORMAL LOW (ref 4.0–10.5)

## 2015-10-10 LAB — LACTATE DEHYDROGENASE: LDH: 223 U/L — ABNORMAL HIGH (ref 98–192)

## 2015-10-10 LAB — PROTIME-INR
INR: 1.99
PROTHROMBIN TIME: 22.9 s — AB (ref 11.4–15.2)

## 2015-10-10 MED ORDER — FUROSEMIDE 40 MG PO TABS: 40.0000 mg | ORAL_TABLET | Freq: Every day | ORAL | 6 refills | Status: DC

## 2015-10-10 MED ORDER — POTASSIUM CHLORIDE CRYS ER 20 MEQ PO TBCR: 20.0000 meq | EXTENDED_RELEASE_TABLET | Freq: Every day | ORAL | 3 refills | Status: DC

## 2015-10-10 NOTE — Progress Notes (Signed)
Heart Failure    [] Hide copied text  Symptom Yes No Details  Angina  x Activity:  Claudication  x How far:  Syncope  x When:  Stroke  x   Orthopnea  x How many pillows:  1  PND  x How often:  CPAP  n/a How many hrs:  Pedal edema x  2+  Abd fullness  x   N&V  x   Diaphoresis  x When:  Bleeding  x   Urine x  Dark-same in hospital  SOB  x Activity:  Palpitations  x When:  ICD shock  x   Hospitlizaitons x  When/where/why:6/14-7/13-heart failure LVAD implant 7/13-7/26 rehab  ED visit  x When/where/why:  Other MD  x When/who/why:  Activity  x PT/OT 2x week  Fluid   No limitations  Diet   No limitations   Vital Signs: HR: 61  Doppler BP:  88 Automatic BP: 106/68 (78)  SPO2: 99  Weight:  181 lbs with equipment Last weight:  166 lbs - hospital -without equipment  VAD interrogation & Equipment Management: Speed:  9000 Flow:  4.8 Power:  5.2 PI:  7.2  Alarms: none Events: 5 - 10 PI events Fixed speed: 9000 Low speed limit: 8400  Primary Controller:  Replace back up battery in 27 months Back up controller:   Replace back up battery in 27 months   I reviewed the LVAD parameters from today and compared the results to the patient's prior recorded data.  LVAD interrogation was NEGATIVE for significant power changes, NEGATIVE for clinical alarms and NEGATIVE for PI events/speed drops.  LVAD equipment check completed and is in good working order. Back-up equipment present. LVAD education done on emergency procedures and precautions and reviewed exit site care.    Exit Site Care: Small amount of serous drainage. No redness, tenderness or odor. Drive line exit site cleaned with Chlora prep applicators x 2, allowed to dry, and Split gauze dressing silver strip applied. Exit site healing, the velour is fully implanted at exit site. Drive line anchor re-applied. Pt denies fever or chills.      Encounter Details: Patient presents for  hospital d/c follow up in VAD Clinic today. He is currently 6 weeks post LVAD implant. Pt reports that he is feeling well and is walking everyday and working with PT/OT 2x a week. Pt did have a fall earlier in the week but did not have any adverse effects from that. Pt states that he is regaining his appetite and is enjoying being at home.   INR 1.99  today. Anticoagulation management includes INR goal of 2 - 2.5 with 81 mg. See anticoagulation flow sheet.   LDH stable at 223.  Denies tea-colored urine.    Patient Instructions: 1. Take 20 mg of Lasix today, Friday and Saturday. Also, take 20 mEq of Potassium whenever you take Lasix. 2. Increase Coumadin dose to 6 mg on Tuesday and Thursday and 3 mg all other days. 3. Return to clinic in 1 week.   Carlton Adam RN, BSN  VAD Coordinator  Office: [phone] 24/7 VAD Pager: [phone]

## 2015-10-10 NOTE — Patient Instructions (Signed)
1. Take 20 mg of Lasix today, Friday and Saturday. Also, take 20 mEq of Potassium whenever you take Lasix. 2. Increase Coumadin dose to 6 mg on Tuesday and Thursday and 3 mg all other days. 3. Return to clinic in 1 week.

## 2015-10-18 ENCOUNTER — Other Ambulatory Visit (HOSPITAL_COMMUNITY): Payer: Self-pay | Admitting: Infectious Diseases

## 2015-10-18 ENCOUNTER — Ambulatory Visit (HOSPITAL_COMMUNITY)
Admission: RE | Admit: 2015-10-18 | Discharge: 2015-10-18 | Disposition: A | Discharge status: Home / Self Care | Payer: Medicare Other | Source: Ambulatory Visit | Attending: Internal Medicine | Admitting: Internal Medicine

## 2015-10-18 ENCOUNTER — Encounter (HOSPITAL_COMMUNITY): Payer: Self-pay

## 2015-10-18 ENCOUNTER — Ambulatory Visit (HOSPITAL_COMMUNITY): Payer: Self-pay | Admitting: *Deleted

## 2015-10-18 VITALS — BP 80/0 | HR 60 | Wt 175.4 lb

## 2015-10-18 DIAGNOSIS — Z7901 Long term (current) use of anticoagulants: Secondary | ICD-10-CM | POA: Insufficient documentation

## 2015-10-18 DIAGNOSIS — I5022 Chronic systolic (congestive) heart failure: Secondary | ICD-10-CM | POA: Insufficient documentation

## 2015-10-18 DIAGNOSIS — Z95811 Presence of heart assist device: Secondary | ICD-10-CM

## 2015-10-18 DIAGNOSIS — I429 Cardiomyopathy, unspecified: Secondary | ICD-10-CM | POA: Diagnosis not present

## 2015-10-18 LAB — CBC
HCT: 35.3 % — ABNORMAL LOW (ref 39.0–52.0)
HEMOGLOBIN: 10.3 g/dL — AB (ref 13.0–17.0)
MCH: 25.6 pg — AB (ref 26.0–34.0)
MCHC: 29.2 g/dL — ABNORMAL LOW (ref 30.0–36.0)
MCV: 87.6 fL (ref 78.0–100.0)
Platelets: 111 10*3/uL — ABNORMAL LOW (ref 150–400)
RBC: 4.03 MIL/uL — AB (ref 4.22–5.81)
RDW: 17.8 % — ABNORMAL HIGH (ref 11.5–15.5)
WBC: 4.6 10*3/uL (ref 4.0–10.5)

## 2015-10-18 LAB — PROTIME-INR
INR: 2.25
PROTHROMBIN TIME: 25.2 s — AB (ref 11.4–15.2)

## 2015-10-18 LAB — BASIC METABOLIC PANEL
ANION GAP: 8 (ref 5–15)
BUN: 11 mg/dL (ref 6–20)
CALCIUM: 9.2 mg/dL (ref 8.9–10.3)
CHLORIDE: 105 mmol/L (ref 101–111)
CO2: 26 mmol/L (ref 22–32)
Creatinine, Ser: 0.95 mg/dL (ref 0.61–1.24)
GFR calc non Af Amer: >60 mL/min (ref >60)
Glucose, Bld: 109 mg/dL — ABNORMAL HIGH (ref 65–99)
POTASSIUM: 4.3 mmol/L (ref 3.5–5.1)
Sodium: 139 mmol/L (ref 135–145)

## 2015-10-18 LAB — LACTATE DEHYDROGENASE: LDH: 229 U/L — ABNORMAL HIGH (ref 98–192)

## 2015-10-18 MED ORDER — AMIODARONE HCL 200 MG PO TABS: 200.0000 mg | ORAL_TABLET | Freq: Every day | ORAL | 0 refills | Status: DC

## 2015-10-18 NOTE — Patient Instructions (Signed)
1.  Decrease Amiodarone to 200 mg one time daily. 2.  Take Lasix 20 mg every other day. 3.  Dressing changes daily. Clean with 2 prep sticks, rinse area with saline wipe. Don't use skin prep stick. 4.  No change in coumadin dose. 5.  Return to VAD in one wek.

## 2015-10-18 NOTE — Progress Notes (Signed)
Symptom Yes No Details  Angina  x Activity:  Claudication  x How far:  Syncope  x When:  Stroke  x   Orthopnea  x How many pillows: 2  PND  x How often:    CPAP  n/a How many hrs:  Pedal edema x  L>R  Abd fullness  x   N&V  x Good appetite; eating 4 - 6 meals daily  Diaphoresis   When:  Bleeding  x   Urine  x Dark urine; clearing over last few days  SOB  x Activity:   Palpitations  x When:  ICD shock  x   Hospitlizaitons  x When/where/why:  ED visit  x When/where/why:  Other MD  x When/who/why:  Activity   Walking a lot - 1.5 miles every day when the weather is nice  Fluid   2L daily   Diet   Low sodium   Vital Signs: Doppler BP: 86 Automatic BP: 88/57 (69) HR: 60 SPO2: 98%  Weight: 175.4 lb Last weight: 181 lbs  VAD interrogation & Equipment Management: Speed:  9000 Flow:  5.5 Power:  5.5w PI: 7.0  Alarms:  none Events:  Rare   Fixed speed: 9000 Low speed limit: 8400  Primary Controller:  Replace back up battery in  27 months. Back up controller:   Replace back up battery in  27 months.  Annual Equipment Maintenance on UBC/PM was performed on 09/2015.   I reviewed the LVAD parameters from today and compared the results to the patient's prior recorded data.  LVAD interrogation was NEGATIVE for significant power changes, NEGATIVE for clinical alarms and STABLE for PI events/speed drops. No programming changes were made and pump is functioning within specified parameters.   LVAD equipment check completed and is in good working order. Back-up equipment present. LVAD education done on emergency procedures and precautions and reviewed exit site care.   Exit Site Care: Drive line is being maintained every other day by his wife. VAD dressing removed and site care performed using sterile technique. Drive line exit site cleaned with Chlora prep applicators x 2 then cleansed with sterile NS d/t fine papular rash under tape, allowed to dry, and gauze dressing with aquacel  strip re-applied WITHOUT SKIN PREP. Exit site healing, the velour is fully implanted at exit site. Some darkened discoloration at the exit site with wet appearance. Thin serous drainage elicited when compressed along DL site across abdomen while assessing tissue ingrowth. No tenderness, drainage, or foul odor noted. Drive line anchor re-applied and educated them on process to perform (they have not changed this since hospital D/C). Provided with 7 daily dressing kits today. Resume daily dressings with drainage and moist appearance.   Encounter Details: Patient presents for 1 week follow up in VAD Clinic today with his wife Carney Bern and his daughter Victorino Dike. Reports he is doing well but still with some anxiety and uneasiness while walking. Reports no falls/near falls. Reports he is having vision changes since surgery. He had cataract surgery prior to VAD however is also on Amiodarone 200 mg BID for AFib after implant. He has an upcoming opthalmology appointment.   Walking about 1.5 miles daily and PT 2x/week at home. Getting stronger. Seems clearer neurologically and family reports improvement. Phase [facility] at some point in the future will be beneficial for him.   Labs: INR 2.25 today. Anticoagulation management includes INR goal of 2.0 - 2.5 with 81 mg ASA. See anticoagulation flow sheet.  LDH stable at 229 with established baseline of 185 - 229. Denies tea-colored urine.   MAPs controlled on current regimen with doppler 86 and auto cuff 88/57 (69).   Medication Changes at this encounter: decrease Amiodarone to 200 mg QD, Lasix 20 mg every other day starting today with 20 mEq KCl  Patient Instructions: 1.  Decrease Amiodarone to 200 mg one time daily. 2.  Take Lasix 20 mg every other day. 3.  Dressing changes daily. Clean with 2 prep sticks, rinse area with saline wipe. Don't use skin prep stick. 4.  No change in coumadin dose. 5.  Return to VAD in one week  RTC in 1 week for F/U  visit .  [name], RN VAD Coordinator   Office: [phone] 24/7 Emergency VAD Pager: [phone]

## 2015-10-21 ENCOUNTER — Ambulatory Visit (INDEPENDENT_AMBULATORY_CARE_PROVIDER_SITE_OTHER): Payer: Self-pay | Admitting: Cardiothoracic Surgery

## 2015-10-21 ENCOUNTER — Encounter: Payer: Self-pay | Admitting: Cardiothoracic Surgery

## 2015-10-21 VITALS — Resp 20 | Ht 74.0 in | Wt 168.2 lb

## 2015-10-21 DIAGNOSIS — Z95811 Presence of heart assist device: Secondary | ICD-10-CM

## 2015-10-21 NOTE — Progress Notes (Signed)
PCP is Donzetta Sprung, MD Referring Provider is Bensimhon, Bevelyn Buckles, MD  Chief Complaint  Patient presents with  . Routine Post Op     f/u from LVAD 08/30/15    HPI: Postop visit 2 months after implantation of HeartMate 2 LVAD for nonischemic cardiomyopathy. Patient is currently at home with home PT and home nursing. He has not required rehospitalization since discharge. He spent 12 days in the CIR rehabilitation unit. He is walking 20-30 minutes daily. His anticoagulation has been without problem. His overall strength and exercise tolerance is improving. He has had no falls and his dizziness is significantly improved surgical incisions are healing well. Driveline exit site is being changed daily. There is some small amount of serous drainage on the dressing gauze with some erythema of the site. He will be started on oral Keflex for 10 days. The patient's weight has been steady. He has some slight ankle edema and is now on Lasix 20 mg daily. VAD parameters have been satisfactory.  Past Medical History:  Diagnosis Date  . Automatic implantable cardioverter-defibrillator in situ   . GERD (gastroesophageal reflux disease)   . H/O hiatal hernia   . Insomnia   . Nephrolithiasis   . Nonischemic cardiomyopathy (HCC)    Cardiac catheterization 12/2011 with LVEF 45%, no significant obstructive CAD  . Osteoarthritis   . Pneumonia    Dec 2016    Past Surgical History:  Procedure Laterality Date  . APPENDECTOMY    . BI-VENTRICULAR IMPLANTABLE CARDIOVERTER DEFIBRILLATOR N/A 12/14/2012   Procedure: BI-VENTRICULAR IMPLANTABLE CARDIOVERTER DEFIBRILLATOR  (CRT-D);  Surgeon: Duke Salvia, MD;  Location: [facility] CATH LAB;  Service: Cardiovascular;  Laterality: N/A;  . BI-VENTRICULAR IMPLANTABLE CARDIOVERTER DEFIBRILLATOR  (CRT-D)  12/14/2012  . CARDIAC CATHETERIZATION  2003?; ~ 11/2012  . CARDIAC CATHETERIZATION N/A 08/22/2015   Procedure: Right/Left Heart Cath and Coronary Angiography;  Surgeon: Dolores Patty, MD;  Location[facility] INVASIVE CV LAB;  Service: Cardiovascular;  Laterality: N/A;  . CATARACT EXTRACTION W/PHACO Left 05/13/2015   Procedure: CATARACT EXTRACTION PHACO AND INTRAOCULAR LENS PLACEMENT LEFT EYE;  CDE:  3.49;  Surgeon: Susa Simmonds, MD;  Location: [facility] ORS;  Service: Ophthalmology;  Laterality: Left;  . CATARACT EXTRACTION W/PHACO Right 07/22/2015   Procedure: CATARACT EXTRACTION PHACO AND INTRAOCULAR LENS PLACEMENT RIGHT EYE CDE=4.50;  Surgeon: Susa Simmonds, MD;  Location: [facility] ORS;  Service: Ophthalmology;  Laterality: Right;  . CYSTOSCOPY W/ STONE MANIPULATION     "3-4 times" (12/14/2012)  . EXPLORATORY LAPAROTOMY  2006?  . INSERTION OF IMPLANTABLE LEFT VENTRICULAR ASSIST DEVICE N/A 08/30/2015   Procedure: INSERTION OF IMPLANTABLE LEFT VENTRICULAR ASSIST DEVICE;  Surgeon: Kerin Perna, MD;  Location: [facility] OR;  Service: Open Heart Surgery;  Laterality: N/A;  . INTRAOPERATIVE TRANSESOPHAGEAL ECHOCARDIOGRAM N/A 08/30/2015   Procedure: INTRAOPERATIVE TRANSESOPHAGEAL ECHOCARDIOGRAM;  Surgeon: Kerin Perna, MD;  Location: [facility] OR;  Service: Open Heart Surgery;  Laterality: N/A;  . KNEE ARTHROSCOPY Left 1990's  . LITHOTRIPSY     "twice" (12/14/2012)  . POSTERIOR FUSION CERVICAL SPINE  2001  . VOLVULUS REDUCTION  2007    Family History  Problem Relation Age of Onset  . Cancer - Prostate Maternal Uncle   . Heart failure Mother   . Stroke Mother   . Asthma Mother   . Heart disease Father   . Heart failure Father   . Congestive Heart Failure Brother     @ 62 Heart transplant, [place]  . Congestive Heart Failure Brother  ICD  . Heart failure Sister     ICD  . Cancer - Ovarian Sister     deceased @ 65    Social History Social History  Substance Use Topics  . Smoking status: Never Smoker  . Smokeless tobacco: Current User    Types: Chew  . Alcohol use No    Current Outpatient Prescriptions  Medication Sig Dispense Refill  . acetaminophen (TYLENOL) 325 MG tablet  Take 2 tablets (650 mg total) by mouth every 4 (four) hours as needed for mild pain or moderate pain.    Marland Kitchen amiodarone (PACERONE) 200 MG tablet Take 1 tablet (200 mg total) by mouth daily. 60 tablet 0  . aspirin EC 81 MG EC tablet Take 1 tablet (81 mg total) by mouth daily.    . bisacodyl (DULCOLAX) 10 MG suppository Place 1 suppository (10 mg total) rectally daily. 12 suppository 0  . calcium carbonate (OSCAL) 1500 (600 Ca) MG TABS tablet Take by mouth daily.    Marland Kitchen Cod Liver Oil 1000 MG CAPS Take by mouth daily.    Marland Kitchen docusate sodium (COLACE) 100 MG capsule Take 2 capsules (200 mg total) by mouth 2 (two) times daily. 100 capsule 0  . ferrous fumarate-b12-vitamic C-folic acid (TRINSICON / FOLTRIN) capsule Take 1 capsule by mouth 3 (three) times daily after meals. 90 capsule 0  . furosemide (LASIX) 20 MG tablet Take 20 mg by mouth every other day. Pt took Thurs, Fri, Sat of this past week.    . Glucosamine Sulfate 1000 MG CAPS Take by mouth daily.    . Multiple Vitamin (MULTIVITAMIN) tablet Take 1 tablet by mouth daily.    Marland Kitchen oxybutynin (DITROPAN) 5 MG tablet Take 1 tablet (5 mg total) by mouth at bedtime. 30 tablet 0  . pantoprazole (PROTONIX) 40 MG tablet Take 1 tablet (40 mg total) by mouth daily. 30 tablet 0  . polyethylene glycol (MIRALAX / GLYCOLAX) packet Take 17 g by mouth daily. 14 each 0  . potassium chloride SA (K-DUR,KLOR-CON) 20 MEQ tablet Take 1 tablet (20 mEq total) by mouth daily. 90 tablet 3  . psyllium (REGULOID) 0.52 g capsule Take 0.52 g by mouth 2 (two) times daily after a meal.    . tamsulosin (FLOMAX) 0.4 MG CAPS capsule Take 1 capsule (0.4 mg total) by mouth daily. 30 capsule 0  . warfarin (COUMADIN) 3 MG tablet Take 1 tablet (3 mg total) by mouth daily. 36 tablet 6   No current facility-administered medications for this visit.     Allergies  Allergen Reactions  . Phenergan [Promethazine Hcl] Nausea And Vomiting  . Lasix [Furosemide] Other (See Comments)    "Dropped blood  pressure too low"  . Morphine And Related Other (See Comments)    Goes crazy     Review of Systems  Appetite is improving Sleep pattern is improving Mental status is improving.  Resp 20   Ht 6\' 2"  (1.88 m)   Wt 168 lb 3.2 oz (76.3 kg) Comment: this AM at home  BMI 21.60 kg/m  Physical Exam Alert and comfortable Pupils equal, conjunctiva thank No JVD Breath sounds clear bilaterally No cardiac sounds appreciated-VAD machinery hum normal Abdomen soft Driveline dressing dry intact Extremities with 1+ ankle edema Patient walked 100 feet easily in the office without difficulty  Diagnostic Tests: None  Impression: Doing well 2 months status post VAD implantation Patient will begin Keflex 500 mg 3 times a day for possible mild driveline exit site infection Patient will  need home health physical therapy for left frozen shoulder syndrome Continue to wean off amiodarone as tolerated.  Plan: Continue daily dressing changes Patient would benefit from outpatient cardiac rehabilitation in 3-4 weeks. He lives in [place] and would probably need to travel to [facility] in [place].  Mikey BussingPeter Van Trigt III, MD [facility] [phone]

## 2015-10-25 ENCOUNTER — Other Ambulatory Visit (HOSPITAL_COMMUNITY): Payer: Self-pay | Admitting: *Deleted

## 2015-10-25 ENCOUNTER — Ambulatory Visit (HOSPITAL_COMMUNITY): Payer: Self-pay | Admitting: *Deleted

## 2015-10-25 ENCOUNTER — Ambulatory Visit (HOSPITAL_COMMUNITY)
Admission: RE | Admit: 2015-10-25 | Discharge: 2015-10-25 | Disposition: A | Discharge status: Home / Self Care | Payer: Medicare Other | Source: Ambulatory Visit | Attending: Cardiology | Admitting: Cardiology

## 2015-10-25 VITALS — BP 100/0 | HR 61 | Ht 74.0 in | Wt 174.0 lb

## 2015-10-25 DIAGNOSIS — I493 Ventricular premature depolarization: Secondary | ICD-10-CM | POA: Insufficient documentation

## 2015-10-25 DIAGNOSIS — I428 Other cardiomyopathies: Secondary | ICD-10-CM | POA: Insufficient documentation

## 2015-10-25 DIAGNOSIS — R413 Other amnesia: Secondary | ICD-10-CM | POA: Diagnosis not present

## 2015-10-25 DIAGNOSIS — M1990 Unspecified osteoarthritis, unspecified site: Secondary | ICD-10-CM | POA: Insufficient documentation

## 2015-10-25 DIAGNOSIS — K219 Gastro-esophageal reflux disease without esophagitis: Secondary | ICD-10-CM | POA: Diagnosis not present

## 2015-10-25 DIAGNOSIS — Z79899 Other long term (current) drug therapy: Secondary | ICD-10-CM | POA: Insufficient documentation

## 2015-10-25 DIAGNOSIS — Z7901 Long term (current) use of anticoagulants: Secondary | ICD-10-CM

## 2015-10-25 DIAGNOSIS — Z7982 Long term (current) use of aspirin: Secondary | ICD-10-CM | POA: Insufficient documentation

## 2015-10-25 DIAGNOSIS — I429 Cardiomyopathy, unspecified: Secondary | ICD-10-CM

## 2015-10-25 DIAGNOSIS — I5022 Chronic systolic (congestive) heart failure: Secondary | ICD-10-CM

## 2015-10-25 DIAGNOSIS — Z95810 Presence of automatic (implantable) cardiac defibrillator: Secondary | ICD-10-CM | POA: Diagnosis not present

## 2015-10-25 DIAGNOSIS — R410 Disorientation, unspecified: Secondary | ICD-10-CM

## 2015-10-25 DIAGNOSIS — Z95811 Presence of heart assist device: Secondary | ICD-10-CM

## 2015-10-25 LAB — BASIC METABOLIC PANEL
ANION GAP: 4 — AB (ref 5–15)
BUN: 12 mg/dL (ref 6–20)
CHLORIDE: 108 mmol/L (ref 101–111)
CO2: 26 mmol/L (ref 22–32)
Calcium: 9.2 mg/dL (ref 8.9–10.3)
Creatinine, Ser: 0.95 mg/dL (ref 0.61–1.24)
GFR calc non Af Amer: >60 mL/min (ref >60)
Glucose, Bld: 120 mg/dL — ABNORMAL HIGH (ref 65–99)
Potassium: 4.1 mmol/L (ref 3.5–5.1)
Sodium: 138 mmol/L (ref 135–145)

## 2015-10-25 LAB — PROTIME-INR
INR: 2.18
PROTHROMBIN TIME: 24.6 s — AB (ref 11.4–15.2)

## 2015-10-25 LAB — CBC
HCT: 34.9 % — ABNORMAL LOW (ref 39.0–52.0)
HEMOGLOBIN: 10.2 g/dL — AB (ref 13.0–17.0)
MCH: 25.3 pg — AB (ref 26.0–34.0)
MCHC: 29.2 g/dL — ABNORMAL LOW (ref 30.0–36.0)
MCV: 86.6 fL (ref 78.0–100.0)
Platelets: 109 10*3/uL — ABNORMAL LOW (ref 150–400)
RBC: 4.03 MIL/uL — AB (ref 4.22–5.81)
RDW: 17.2 % — ABNORMAL HIGH (ref 11.5–15.5)
WBC: 4 10*3/uL (ref 4.0–10.5)

## 2015-10-25 LAB — LACTATE DEHYDROGENASE: LDH: 190 U/L (ref 98–192)

## 2015-10-25 MED ORDER — FE FUMARATE-B12-VIT C-FA-IFC PO CAPS: 1.0000 | ORAL_CAPSULE | Freq: Three times a day (TID) | ORAL | 0 refills | Status: AC

## 2015-10-25 NOTE — Progress Notes (Addendum)
Symptom Yes No Details  Angina  x Activity:  Claudication  x How far:  Syncope  x When:  Stroke  x   Orthopnea  x How many pillows: 2  PND  x How often:    CPAP  n/a How many hrs:  Pedal edema  x   Abd fullness  x   N&V  x Fair appetite; eating 4 - 6 meals daily  Diaphoresis   When:  Bleeding x  Nosebleed x 1 this week  Urine  x Dark, orange urine  SOB  x Activity:   Palpitations  x When:  ICD shock  x   Hospitlizaitons  x When/where/why:  ED visit  x When/where/why:  Other MD  x When/who/why:  Activity   Walking daily  Fluid   2L daily   Diet   Low sodium   Vital Signs: Doppler BP: 100 Automatic BP: 100/76 (86) HR: 61 SPO2: 100%  Weight: 174 lb Last weight: 175.4 lbs  VAD interrogation & Equipment Management: Speed:  9000 Flow:  5.0 Power:  5.1w PI: 7.7  Alarms:  none Events:  Rare   Fixed speed: 9000 Low speed limit: 8400  Primary Controller:  Replace back up battery in  27 months. Back up controller:   Replace back up battery in  27 months.  Optivol and EKG obtained for Dr. Prescott Gum review.   I reviewed the LVAD parameters from today and compared the results to the patient's prior recorded data.  LVAD interrogation was NEGATIVE for significant power changes, NEGATIVE for clinical alarms and STABLE for PI events/speed drops. No programming changes were made and pump is functioning within specified parameters.   LVAD equipment check completed and is in good working order. Back-up equipment present. LVAD education done on emergency procedures and precautions and reviewed exit site care.   Exit Site Care: Drive line is being maintained daily by his wife. Pt saw Dr. Maren Beach in clinic Monday of this week and was started on Keflex 500 mg three times daily for 10 days. Wife reports she changed dressing last night and saw no drainage, tenderness, or foul odor; site remains red. Gauze dressing dry and intact; drive line anchor intact. Continue daily dressing  changes.    Encounter Details: Patient presents for 1 week follow up in VAD Clinic today with his wife Carney Bern. Wife reports continuing confusion without improvement. Also reports pt will "do nothing for himself".  States he is more sedentary than usual, wanting to sit and watch TV most of the time.  Pt unable to change power sources on VAD.  VAD Coordinator worked with him during clinic visit in making power source exchanges and he "forgets" correct steps and purpose of equipment.   Admits to being scared of "making a mistake"; encouragement offered. Brought another VAD patient and his spouse in to meet and talk with both pt and wife.  All agreed to attend VAD Support Group Monday night, 10/28/15.  Wife reports pt had nosebleed x 1 this week and pt became very anxious and wanted to come to ED. His son-in-law intervened and was able to calm pt down; nosebleed resolved quickly. Reviewed Nosebleed Handout in Discharge Binder with pt and wife; wife will obtain and keep Afrin for future use.   Reports no falls/near falls. Reports he is having vision changes since surgery. He had cataract surgery prior to VAD however is also on Amiodarone 200 mg daily for AFib after implant. He had opthalmology appointment and was told  his vision is fine. He does however need "reading glasses".   Wife reports pt having issues with battery holster and wants to try bag. Fitted him with consolidated bag - he did not like the feel. We reverted back to battery holster with controller on belt. He did try the vest I gave him last week, but wife reports it was "too large", plan on returning.   Pt is exited about [place]. Will get nearest VAD center contact info for him and family.  Pt will need CXR today per Dr. Lorrin MaisVanTrigt's request.   Labs: INR 2.18 today. Anticoagulation management includes INR goal of 2.0 - 2.5 with 81 mg ASA. Wife reports he is taking 3 mg daily except 6 mg on Tues/Thurs.  See anticoagulation flow sheet.   LDH stable at 190 with established baseline of 185 - 229. Denies tea-colored urine.   MAPs controlled on current regimen with doppler modified systolic 100 and auto cuff 100/76 (86).    Patient Instructions: 1.  Stop amiodarone. 2.  Stop Ditropan. 3.  Increase ferrous fumarate-b12-vitamic C-folic acid (TRINSICON / FOLTRIN) capsule to three times daily with meals. 4.  No change in coumadin dose. 5.  May use Afrin nasal spay for nosebleeds. 6.  Increase activity level. 7.  Chest X-ray today. 8.  Return to VAD clinic in 2 weeks.    Hessie DienerMolly Reece, RN VAD Coordinator  Office: [phone] 24/7 Emergency VAD Pager: [phone]

## 2015-10-25 NOTE — Progress Notes (Signed)
LVAD CLINIC NOTE   HPI:  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   Post-op course c/b delirium/sundowning, urinary retention and deconditioning. Was eventually transferred to CIR.   Returns with his family.    Follow up for Heart Failure/LVAD:   Here with his family. Says he is doing well but still with some anxiety and uneasiness while walking. Reports no falls/near falls. Walking about 1.5 miles every day without difficulty. Doing HHPT 2x/week. Getting stronger. Appetite improved. Taking lasix 20 qod. No dizziness. Neuro status improving but still with some memory issues.   Reports he is having vision changes since surgery. He had cataract surgery prior to VAD however is also on Amiodarone 200 mg BID.Marland Kitchen. He has an upcoming opthalmology appointment.   No bleeding or neuro symptoms. Having minimal serous drainage around driveline.    VAD interrogation & Equipment Management: Speed:  9000 Flow:  5.5 Power:  5.5w PI: 7.0  Alarms:  none Events:  Rare   Fixed speed: 9000 Low speed limit: 8400  Primary Controller:  Replace back up battery in  27 months. Back up controller:   Replace back up battery in  27 months.  Annual Equipment Maintenance on UBC/PM was performed on 09/2015.   Denies LVAD alarms.  Denies driveline trauma, erythema or drainage.  Denies ICD shocks.   Reports taking Coumadin as prescribed and adherence to anticoagulation based dietary restrictions.  Denies bright red blood per rectum or melena, no dark urine or hematuria.    VAD interrogation & Equipment Management: Speed: 9000 Flow: 4.8 Power: 5.2 PI: 7.2  Alarms: none Events: 5 - 10 PI events Fixed speed: 9000 Low speed limit: 8400  Primary Controller: Replace back up battery in 27months Back up controller: Replace back up battery in 27months   I reviewed the LVAD parameters  from todayand compared the results to the patient's prior recorded data.LVAD interrogation was NEGATIVEfor significant power changes, NEGATIVEfor clinicalalarms and NEGATIVEfor PI events/speed drops.  LVAD equipment check completed and is in good working order. Back-up equipment present. LVAD education done on emergency procedures and precautions and reviewed exit site care.     I reviewed the LVAD parameters from today, and compared the results to the patient's prior recorded data.  No programming changes were made.  The LVAD is functioning within specified parameters.  The patient performs LVAD self-test daily.  LVAD interrogation was negative for any significant power changes, alarms or PI events/speed drops.  LVAD equipment check completed and is in good working order.  Back-up equipment present.   LVAD education done on emergency procedures and precautions and reviewed exit site care.    Past Medical History:  Diagnosis Date  . Automatic implantable cardioverter-defibrillator in situ   . GERD (gastroesophageal reflux disease)   . H/O hiatal hernia   . Insomnia   . Nephrolithiasis   . Nonischemic cardiomyopathy (HCC)    Cardiac catheterization 12/2011 with LVEF 45%, no significant obstructive CAD  . Osteoarthritis   . Pneumonia    Dec 2016    Current Outpatient Prescriptions  Medication Sig Dispense Refill  . acetaminophen (TYLENOL) 325 MG tablet Take 2 tablets (650 mg total) by mouth every 4 (four) hours as needed for mild pain or moderate pain.    Marland Kitchen. amiodarone (PACERONE) 200 MG tablet Take 1 tablet (200 mg total) by mouth daily. 60 tablet 0  .  aspirin EC 81 MG EC tablet Take 1 tablet (81 mg total) by mouth daily.    . bisacodyl (DULCOLAX) 10 MG suppository Place 1 suppository (10 mg total) rectally daily. 12 suppository 0  . calcium carbonate (OSCAL) 1500 (600 Ca) MG TABS tablet Take by mouth daily.    Marland Kitchen Cod Liver Oil 1000 MG CAPS Take by mouth daily.    Marland Kitchen docusate  sodium (COLACE) 100 MG capsule Take 2 capsules (200 mg total) by mouth 2 (two) times daily. 100 capsule 0  . ferrous fumarate-b12-vitamic C-folic acid (TRINSICON / FOLTRIN) capsule Take 1 capsule by mouth 3 (three) times daily after meals. 90 capsule 0  . furosemide (LASIX) 20 MG tablet Take 20 mg by mouth every other day. Pt took Thurs, Fri, Sat of this past week.    . Glucosamine Sulfate 1000 MG CAPS Take by mouth daily.    . Multiple Vitamin (MULTIVITAMIN) tablet Take 1 tablet by mouth daily.    Marland Kitchen oxybutynin (DITROPAN) 5 MG tablet Take 1 tablet (5 mg total) by mouth at bedtime. 30 tablet 0  . pantoprazole (PROTONIX) 40 MG tablet Take 1 tablet (40 mg total) by mouth daily. 30 tablet 0  . polyethylene glycol (MIRALAX / GLYCOLAX) packet Take 17 g by mouth daily. 14 each 0  . psyllium (REGULOID) 0.52 g capsule Take 0.52 g by mouth 2 (two) times daily after a meal.    . tamsulosin (FLOMAX) 0.4 MG CAPS capsule Take 1 capsule (0.4 mg total) by mouth daily. 30 capsule 0  . warfarin (COUMADIN) 3 MG tablet Take 1 tablet (3 mg total) by mouth daily. 36 tablet 6  . potassium chloride SA (K-DUR,KLOR-CON) 20 MEQ tablet Take 1 tablet (20 mEq total) by mouth daily. 90 tablet 3   No current facility-administered medications for this encounter.     Phenergan [promethazine hcl]; Lasix [furosemide]; and Morphine and related  REVIEW OF SYSTEMS: All systems negative except as listed in HPI, PMH and Problem list.    Vitals:   10/18/15 1134  BP: (!) 80/0  Pulse: 60  SpO2: 96%  Weight: 175 lb 6.4 oz (79.6 kg)   Vital Signs: Doppler BP: 86 Automatic BP: 88/57 (69) HR: 60 SPO2: 98%  Weight: 175.4 lb Last weight: 181 lbs   Physical Exam: GENERAL: Well appearing, male who presents to clinic today in no acute distress. HEENT: normal  NECK: Supple, JVP 7  2+ bilaterally, no bruits.  No lymphadenopathy or thyromegaly appreciated.   CARDIAC:  Mechanical heart sounds with LVAD hum present.  LUNGS:   Clear to auscultation bilaterally.  ABDOMEN:  Soft, round, nontender, positive bowel sounds x4.     LVAD exit site: well-healed and incorporated.  Dressing dry and intact.  No erythema. Very mild non-odorous serous drainage.  Stabilization device present and accurately applied.  Driveline dressing is being changed daily per sterile technique. EXTREMITIES:  Warm and dry, no cyanosis, clubbing, rash or edema  NEUROLOGIC:  Alert and oriented x 4.  Gait steady.  No aphasia.  No dysarthria.  Affect pleasant.      ASSESSMENT AND PLAN:   1. 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 EF 15-20%. S/p HM-II LVAD 08/30/15 --V. Continues to improve. NYHA I-II. Volume status looks good --Continue current therapy. Lasix 20 qod.  --Continue to be acitve. Continue PT.  2. Frequent PVCs/VT --Decrease amio to 200 daily.  3. Deconditioning 4.  Anticoagulation management - INR goal 2.0-3.0. INR today 2.25. LDH ok. No change  5. LVAD - parameters stable. Watch driveline closely. May need abx soon.  Continue coumadin.  6. Mental status --Still with some memory loss but delirium seems improved.   Bensimhon, Daniel,MD 12:37 AM

## 2015-10-25 NOTE — Progress Notes (Signed)
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   Post-op course c/b delirium/sundowning, urinary retention and deconditioning. Was eventually transferred to CIR.   Follow up for Heart Failure/LVAD: At last visit he was feeling very well. Walking regularly. MAPs a bit low so lasix decreased to 20 qod. Amiodarone also dropped to 200 daily. Recently also started on abx for superficial drive line infection. He feels ok today. Continues to walk 2 times a day for about 15 mins. No dizziness or dyspnea. Wife says he is lazy and doesn't do much else. Memory remains poor.     Denies LVAD alarms.  Denies driveline trauma, erythema or drainage.  Denies ICD shocks.   Reports taking Coumadin as prescribed and adherence to anticoagulation based dietary restrictions.  Denies bright red blood per rectum or melena, no dark urine or hematuria.    VAD interrogation & Equipment Management: Speed: 9000 Flow: 4.8 Power: 5.2 PI: 7.2  Alarms: none Events: 5 - 10 PI events Fixed speed: 9000 Low speed limit: 8400  Primary Controller: Replace back up battery in 42months Back up controller: Replace back up battery in 52months   I reviewed the LVAD parameters from todayand compared the results to the patient's prior recorded data.LVAD interrogation was NEGATIVEfor significant power changes, NEGATIVEfor clinicalalarms and NEGATIVEfor PI events/speed drops.  LVAD equipment check completed and is in good working order. Back-up equipment present. LVAD education done on emergency procedures and precautions and reviewed exit site care.     I reviewed the LVAD parameters from today, and compared the results to the patient's prior recorded data.  No programming changes were made.  The LVAD is functioning within specified parameters.  The patient performs LVAD  self-test daily.  LVAD interrogation was negative for any significant power changes, alarms or PI events/speed drops.  LVAD equipment check completed and is in good working order.  Back-up equipment present.   LVAD education done on emergency procedures and precautions and reviewed exit site care.    Past Medical History:  Diagnosis Date  . Automatic implantable cardioverter-defibrillator in situ   . GERD (gastroesophageal reflux disease)   . H/O hiatal hernia   . Insomnia   . Nephrolithiasis   . Nonischemic cardiomyopathy (HCC)    Cardiac catheterization 12/2011 with LVEF 45%, no significant obstructive CAD  . Osteoarthritis   . Pneumonia    Dec 2016    Current Outpatient Prescriptions  Medication Sig Dispense Refill  . amiodarone (PACERONE) 200 MG tablet Take 1 tablet (200 mg total) by mouth daily. 60 tablet 0  . aspirin EC 81 MG EC tablet Take 1 tablet (81 mg total) by mouth daily.    . calcium carbonate (OSCAL) 1500 (600 Ca) MG TABS tablet Take by mouth daily.    Marland Kitchen Cod Liver Oil 1000 MG CAPS Take by mouth daily.    . ferrous fumarate-b12-vitamic C-folic acid (TRINSICON / FOLTRIN) capsule Take 1 capsule by mouth 3 (three) times daily after meals. (Patient taking differently: Take 1 capsule by mouth daily. ) 90 capsule 0  . furosemide (LASIX) 20 MG tablet Take 20 mg by mouth every other day. Pt took Thurs, Fri, Sat of this past week.    . Glucosamine Sulfate 1000 MG CAPS Take by mouth daily.    . Multiple Vitamin (MULTIVITAMIN) tablet Take 1 tablet by mouth  daily.    . oxybutynin (DITROPAN) 5 MG tablet Take 1 tablet (5 mg total) by mouth at bedtime. 30 tablet 0  . pantoprazole (PROTONIX) 40 MG tablet Take 1 tablet (40 mg total) by mouth daily. 30 tablet 0  . acetaminophen (TYLENOL) 325 MG tablet Take 2 tablets (650 mg total) by mouth every 4 (four) hours as needed for mild pain or moderate pain. (Patient not taking: Reported on 10/25/2015)    . potassium chloride SA (K-DUR,KLOR-CON) 20  MEQ tablet Take 1 tablet (20 mEq total) by mouth daily. 90 tablet 3  . psyllium (REGULOID) 0.52 g capsule Take 0.52 g by mouth 2 (two) times daily after a meal.    . tamsulosin (FLOMAX) 0.4 MG CAPS capsule Take 1 capsule (0.4 mg total) by mouth daily. 30 capsule 0  . warfarin (COUMADIN) 3 MG tablet Take 1 tablet (3 mg total) by mouth daily. 36 tablet 6   No current facility-administered medications for this encounter.     Phenergan [promethazine hcl]; Lasix [furosemide]; and Morphine and related  REVIEW OF SYSTEMS: All systems negative except as listed in HPI, PMH and Problem list.    Vitals:   10/25/15 1144 10/25/15 1152  BP: 100/76 (!) 100/0  Pulse: 61   SpO2: 100%   Weight: 174 lb (78.9 kg)   Height: 6\' 2"  (1.88 m)    Vital Signs: HR: 61  Doppler BP: 88 Automatic BP: 106/68(78)  SPO2: 99   Physical Exam: GENERAL: Well appearing, male who presents to clinic today in no acute distress. HEENT: normal  NECK: Supple, JVP 6.  2+ bilaterally, no bruits.  No lymphadenopathy or thyromegaly appreciated.   CARDIAC:  Mechanical heart sounds with LVAD hum present.  LUNGS:  Clear to auscultation bilaterally.  ABDOMEN:  Soft, round, nontender, positive bowel sounds x4.     LVAD exit site: well-healed and incorporated.  Dressing dry and intact.  No erythema or drainage.  Stabilization device present and accurately applied.  Driveline dressing is being changed daily per sterile technique. EXTREMITIES:  Warm and dry, no cyanosis, clubbing, rash or edema  NEUROLOGIC:  Alert and oriented. Knows hospital, president and VP. Doesn't know year.  Unable to make simple calculations.      ASSESSMENT AND PLAN:   1. 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 EF 15-20%. S/p HM-II LVAD 08/30/15 --volume status looks good. NYHA II-II. MAPs ok.  - Continue current meds - VAD parents stable. - ICD interrogated personally. In clinic no VT/VF, No AF. Activity level about 1hr/day - VAD parameters  and vitals stable.  2. Frequent PVCs 3. Deconditioning 4.  Anticoagulation management - INR goal 2.0-3.0.  Will evaluate INR value today and follow up with patient for necessary changes.   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 Hopefully these will improve with time May need neuro referral.  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

## 2015-10-25 NOTE — Patient Instructions (Addendum)
1.  Stop amiodarone. 2.  Stop Ditropan. 3.  Increase ferrous fumarate-b12-vitamic C-folic acid (TRINSICON / FOLTRIN) capsule to three times daily with meals. 4.  No change in coumadin dose. 5.  May use Afrin nasal spay for nosebleeds. 6.  Increase activity level. 7.  Chest X-ray today. 8.  Return to VAD clinic in 2 weeks.

## 2015-10-25 NOTE — Progress Notes (Signed)
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   Post-op course c/b delirium/sundowning, urinary retention and deconditioning. Was eventually transferred to CIR.   Returns with his family.    Follow up for Heart Failure/LVAD:  He is now 6 weeks out from VAD. Feels he is getting stronger every day. Walking everyday and working with PT/OT 2x a week. Pt did have a fall earlier in the week but did not have any adverse effects from that. Appetite improving and mental status seems to be clearing. No dizziness. No fevers or chills. No edema. Compliant with all meds.   Denies LVAD alarms.  Denies driveline trauma, erythema or drainage.  Denies ICD shocks.   Reports taking Coumadin as prescribed and adherence to anticoagulation based dietary restrictions.  Denies bright red blood per rectum or melena, no dark urine or hematuria.     VAD interrogation & Equipment Management: Speed: 9000 Flow: 4.8 Power: 5.2 PI: 7.2  Alarms: none Events: 5 - 10 PI events Fixed speed: 9000 Low speed limit: 8400  Primary Controller: Replace back up battery in 66months Back up controller: Replace back up battery in 98months  I reviewed the LVAD parameters from todayand compared the results to the patient's prior recorded data.LVAD interrogation was NEGATIVEfor significant power changes, NEGATIVEfor clinicalalarms and NEGATIVEfor PI events/speed drops.  LVAD equipment check completed and is in good working order. Back-up equipment present. LVAD education done on emergency procedures and precautions and reviewed exit site care.     I reviewed the LVAD parameters from today, and compared the results to the patient's prior recorded data.  No programming changes were made.  The LVAD is functioning within specified parameters.  The patient performs LVAD self-test  daily.  LVAD interrogation was negative for any significant power changes, alarms or PI events/speed drops.  LVAD equipment check completed and is in good working order.  Back-up equipment present.   LVAD education done on emergency procedures and precautions and reviewed exit site care.    Past Medical History:  Diagnosis Date  . Automatic implantable cardioverter-defibrillator in situ   . GERD (gastroesophageal reflux disease)   . H/O hiatal hernia   . Insomnia   . Nephrolithiasis   . Nonischemic cardiomyopathy (HCC)    Cardiac catheterization 12/2011 with LVEF 45%, no significant obstructive CAD  . Osteoarthritis   . Pneumonia    Dec 2016    Current Outpatient Prescriptions  Medication Sig Dispense Refill  . aspirin EC 81 MG EC tablet Take 1 tablet (81 mg total) by mouth daily.    . calcium carbonate (OSCAL) 1500 (600 Ca) MG TABS tablet Take by mouth daily.    Marland Kitchen Cod Liver Oil 1000 MG CAPS Take by mouth daily.    . ferrous fumarate-b12-vitamic C-folic acid (TRINSICON / FOLTRIN) capsule Take 1 capsule by mouth 3 (three) times daily after meals. (Patient taking differently: Take 1 capsule by mouth daily. ) 90 capsule 0  . Glucosamine Sulfate 1000 MG CAPS Take by mouth daily.    . Multiple Vitamin (MULTIVITAMIN) tablet Take 1 tablet by mouth daily.    Marland Kitchen oxybutynin (DITROPAN) 5 MG tablet Take 1 tablet (5 mg total) by mouth at bedtime. 30 tablet 0  . pantoprazole (PROTONIX) 40 MG tablet Take 1 tablet (40 mg total) by mouth daily. 30 tablet 0  . psyllium (REGULOID) 0.52  g capsule Take 0.52 g by mouth 2 (two) times daily after a meal.    . tamsulosin (FLOMAX) 0.4 MG CAPS capsule Take 1 capsule (0.4 mg total) by mouth daily. 30 capsule 0  . warfarin (COUMADIN) 3 MG tablet Take 1 tablet (3 mg total) by mouth daily. 36 tablet 6  . acetaminophen (TYLENOL) 325 MG tablet Take 2 tablets (650 mg total) by mouth every 4 (four) hours as needed for mild pain or moderate pain. (Patient not taking:  Reported on 10/25/2015)    . amiodarone (PACERONE) 200 MG tablet Take 1 tablet (200 mg total) by mouth daily. 60 tablet 0  . furosemide (LASIX) 20 MG tablet Take 20 mg by mouth every other day. Pt took Thurs, Fri, Sat of this past week.    . potassium chloride SA (K-DUR,KLOR-CON) 20 MEQ tablet Take 1 tablet (20 mEq total) by mouth daily. 90 tablet 3   No current facility-administered medications for this encounter.     Phenergan [promethazine hcl]; Lasix [furosemide]; and Morphine and related  REVIEW OF SYSTEMS: All systems negative except as listed in HPI, PMH and Problem list.    Vitals:   10/10/15 1132  BP: 106/68  Pulse: 61  SpO2: 97%  Weight: 181 lb (82.1 kg)  Height: 6\' 2"  (1.88 m)   Vital Signs: HR: 61  Doppler BP: 88 Automatic BP: 106/68(78)  SPO2: 99  Weight: 181lbs with equipment Last weight: 166lbs - hospital -without equipment  Physical Exam: GENERAL: Well appearing, male who presents to clinic today in no acute distress. HEENT: normal  NECK: Supple, JVP 7.  2+ bilaterally, no bruits.  No lymphadenopathy or thyromegaly appreciated.   CARDIAC:  Mechanical heart sounds with LVAD hum present.  LUNGS:  Clear to auscultation bilaterally.  ABDOMEN:  Soft, round, nontender, positive bowel sounds x4.     LVAD exit site: well-healed and incorporated.  Dressing dry and intact.  No erythema. Minimal serous drainage.  Stabilization device present and accurately applied.  Driveline dressing is being changed daily per sterile technique. EXTREMITIES:  Warm and dry, no cyanosis, clubbing, rash or edema  NEUROLOGIC:  Alert and oriented x 4.  Gait steady.  No aphasia.  No dysarthria.  Affect pleasant.      ASSESSMENT AND PLAN:   1. 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 EF 15-20%. S/p HM-II LVAD 08/30/15 --volume status looks good --NYHA II --Continue current therapy. Lasix prn only.  --Continue to be acitve. Continue PT.  2. Frequent PVCs/VT --Continue amio  for now. Can wean as tolerated.  3. Deconditioning 4.  Anticoagulation management - INR goal 2.0-3.0. INR today 1.99. No change 5. LVAD - parameters stable. Driveline ok. Continue coumadin.    Marites Nath,MD 12:24 AM

## 2015-10-31 ENCOUNTER — Other Ambulatory Visit (HOSPITAL_COMMUNITY): Payer: Self-pay | Admitting: Unknown Physician Specialty

## 2015-10-31 DIAGNOSIS — Z95811 Presence of heart assist device: Secondary | ICD-10-CM

## 2015-10-31 DIAGNOSIS — Z7901 Long term (current) use of anticoagulants: Secondary | ICD-10-CM

## 2015-11-01 ENCOUNTER — Other Ambulatory Visit (HOSPITAL_COMMUNITY): Payer: Self-pay | Admitting: Unknown Physician Specialty

## 2015-11-01 ENCOUNTER — Ambulatory Visit (HOSPITAL_COMMUNITY)
Admission: RE | Admit: 2015-11-01 | Discharge: 2015-11-01 | Disposition: A | Discharge status: Home / Self Care | Payer: Medicare Other | Source: Ambulatory Visit | Attending: Internal Medicine | Admitting: Internal Medicine

## 2015-11-01 ENCOUNTER — Ambulatory Visit (HOSPITAL_COMMUNITY)
Admission: RE | Admit: 2015-11-01 | Discharge: 2015-11-01 | Disposition: A | Discharge status: Home / Self Care | Payer: Medicare Other | Source: Ambulatory Visit | Attending: Cardiology | Admitting: Cardiology

## 2015-11-01 VITALS — BP 109/70 | HR 66 | Ht 74.0 in | Wt 177.2 lb

## 2015-11-01 DIAGNOSIS — M1990 Unspecified osteoarthritis, unspecified site: Secondary | ICD-10-CM | POA: Insufficient documentation

## 2015-11-01 DIAGNOSIS — Z95811 Presence of heart assist device: Secondary | ICD-10-CM

## 2015-11-01 DIAGNOSIS — Z7982 Long term (current) use of aspirin: Secondary | ICD-10-CM | POA: Insufficient documentation

## 2015-11-01 DIAGNOSIS — Z7901 Long term (current) use of anticoagulants: Secondary | ICD-10-CM | POA: Diagnosis not present

## 2015-11-01 DIAGNOSIS — Z95810 Presence of automatic (implantable) cardiac defibrillator: Secondary | ICD-10-CM | POA: Diagnosis not present

## 2015-11-01 DIAGNOSIS — I428 Other cardiomyopathies: Secondary | ICD-10-CM | POA: Insufficient documentation

## 2015-11-01 DIAGNOSIS — R413 Other amnesia: Secondary | ICD-10-CM

## 2015-11-01 DIAGNOSIS — Z79899 Other long term (current) drug therapy: Secondary | ICD-10-CM | POA: Insufficient documentation

## 2015-11-01 DIAGNOSIS — T829XXS Unspecified complication of cardiac and vascular prosthetic device, implant and graft, sequela: Secondary | ICD-10-CM

## 2015-11-01 DIAGNOSIS — I5022 Chronic systolic (congestive) heart failure: Secondary | ICD-10-CM | POA: Diagnosis present

## 2015-11-01 DIAGNOSIS — I700 Atherosclerosis of aorta: Secondary | ICD-10-CM | POA: Insufficient documentation

## 2015-11-01 DIAGNOSIS — K219 Gastro-esophageal reflux disease without esophagitis: Secondary | ICD-10-CM | POA: Diagnosis not present

## 2015-11-01 DIAGNOSIS — I493 Ventricular premature depolarization: Secondary | ICD-10-CM | POA: Diagnosis not present

## 2015-11-01 DIAGNOSIS — R0602 Shortness of breath: Secondary | ICD-10-CM | POA: Diagnosis not present

## 2015-11-01 LAB — CBC
HEMATOCRIT: 34.5 % — AB (ref 39.0–52.0)
HEMOGLOBIN: 10.4 g/dL — AB (ref 13.0–17.0)
MCH: 25.8 pg — ABNORMAL LOW (ref 26.0–34.0)
MCHC: 30.1 g/dL (ref 30.0–36.0)
MCV: 85.6 fL (ref 78.0–100.0)
Platelets: 112 10*3/uL — ABNORMAL LOW (ref 150–400)
RBC: 4.03 MIL/uL — AB (ref 4.22–5.81)
RDW: 17 % — ABNORMAL HIGH (ref 11.5–15.5)
WBC: 3.6 10*3/uL — AB (ref 4.0–10.5)

## 2015-11-01 LAB — BASIC METABOLIC PANEL
Anion gap: 3 — ABNORMAL LOW (ref 5–15)
BUN: 11 mg/dL (ref 6–20)
CALCIUM: 8.9 mg/dL (ref 8.9–10.3)
CHLORIDE: 109 mmol/L (ref 101–111)
CO2: 28 mmol/L (ref 22–32)
Creatinine, Ser: 0.91 mg/dL (ref 0.61–1.24)
GFR calc Af Amer: >60 mL/min (ref >60)
GLUCOSE: 147 mg/dL — AB (ref 65–99)
Potassium: 3.9 mmol/L (ref 3.5–5.1)
Sodium: 140 mmol/L (ref 135–145)

## 2015-11-01 LAB — PROTIME-INR
INR: 2.05
PROTHROMBIN TIME: 23.5 s — AB (ref 11.4–15.2)

## 2015-11-01 LAB — LACTATE DEHYDROGENASE: LDH: 190 U/L (ref 98–192)

## 2015-11-01 MED ORDER — WARFARIN SODIUM 6 MG PO TABS: 6.0000 mg | ORAL_TABLET | Freq: Every day | ORAL | 6 refills | Status: DC

## 2015-11-01 MED ORDER — TAMSULOSIN HCL 0.4 MG PO CAPS: 0.4000 mg | ORAL_CAPSULE | Freq: Every day | ORAL | 0 refills | Status: DC

## 2015-11-01 MED ORDER — DOXYCYCLINE HYCLATE 100 MG PO CAPS: 100.0000 mg | ORAL_CAPSULE | Freq: Two times a day (BID) | ORAL | 0 refills | Status: AC

## 2015-11-01 NOTE — Progress Notes (Addendum)
Symptom Yes No Details  Angina  x Activity:  Claudication  x How far:  Syncope  x When:  Stroke  x   Orthopnea  x How many pillows: 2  PND  x How often:    CPAP  n/a How many hrs:  Pedal edema x  1+  Abd fullness  x   N&V  x Fair appetite; eating 4 - 6 meals daily  Diaphoresis   When:  Bleeding  x Nosebleed x 1 this week  Urine  x Dark, orange urine  SOB  x Activity:   Palpitations  x When:  ICD shock  x   Hospitlizaitons  x When/where/why:  ED visit  x When/where/why:  Other MD  x When/who/why:  Activity   Walking daily  Fluid   2L daily   Diet   Low sodium   Vital Signs: Doppler BP: 98 Automatic BP: 109/70 (82) HR: 66 SPO2: 99%  Weight: 177.2 lb Last weight: 174 lbs  VAD interrogation & Equipment Management: Speed:  9000 Flow:  4.9 Power:  5.2w PI: 7.7  Alarms:  none Events:  Rare   Fixed speed: 9000 Low speed limit: 8400  Primary Controller:  Replace back up battery in  27 months. Back up controller:   Replace back up battery in  27 months.   I reviewed the LVAD parameters from today and compared the results to the patient's prior recorded data.  LVAD interrogation was NEGATIVE for significant power changes, NEGATIVE for clinical alarms and STABLE for PI events/speed drops. No programming changes were made and pump is functioning within specified parameters.   LVAD equipment check completed and is in good working order. Back-up equipment present. LVAD education done on emergency procedures and precautions and reviewed exit site care.   Exit Site Care: Drive line is being maintained daily by his wife. Dressing removed. Driveline site had serous drainage, slightly red, with no tenderness or foul odor. Tonye BecketAmy Clegg, NP applied silver nitrate to red area underneath driveline. Also, cultured driveline site. Drive line exit site cleaned with Chlora prep applicators x 2 then cleansed with sterile NS d/t fine papular rash under tape, allowed to dry, and gauze dressing  with aquacel strip re-applied. Exit site healing, the velour is fully implanted at exit site.  No tenderness, drainage, or foul odor noted. Drive line anchor re-applied and educated them on process to perform (they have not changed this since hospital D/C). Provided with 5 daily dressing kits today. Resume daily dressings with drainage and moist appearance.   Encounter Details: Patient presents for 1 week follow up in VAD Clinic today with [name]. Wife reports continuing confusion without improvement. Also reports pt will "do nothing for himself".    Pt will need CXR today per Dr. Lorrin MaisVanTrigt's request.   Labs: INR 2.18 today. Anticoagulation management includes INR goal of 2.0 - 2.5 with 81 mg ASA. Wife reports he is taking 3 mg daily except 6 mg on Tues/Thurs. See anticoagulation flow sheet.   LDH stable at 190 with established baseline of 185 - 229. Denies tea-colored urine.   MAPs controlled on current regimen with doppler modified systolic 100 and auto cuff 109/70 (82).   Culture obtained from driveline site.   Patient Instructions: 1.  Start Doxycycline 100 mg BID for 7 days for increase drainage and redness around driveline site. 2. Please take 20 mg of Lasix today and tomorrow  And then resume normal dosing of Monday, Wednesday and Friday.  3.  No change in coumadin dose. 4.  Increase activity level. 5.  Chest X-ray today. 6.  Return to VAD clinic in 1 week.    Carlton Adam RN, BSN VAD Coordinator  Office: [phone] 24/7 Emergency VAD Pager: [phone]

## 2015-11-01 NOTE — Progress Notes (Signed)
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   Post-op course c/b delirium/sundowning, urinary retention and deconditioning. Was eventually transferred to CIR.   Follow up for Heart Failure/LVAD: Completed antibiotic course with keflex. Still having drainage around drive line. Overall feeling ok. Appetite improving. Denies SOB but admits to dyspnea with inclines. Denies orthopnea. Weight at home 167-169 pounds. Missed a dose of lasix this week. Able to walk 1 mile. His wife is performing daily dressing changes. No fever. No BPRBP. Continue to work with HHPT, HHOT, and HHST.   ADenies LVAD alarms.  Denies driveline trauma, erythema or drainage.  Denies ICD shocks.   Reports taking Coumadin as prescribed and adherence to anticoagulation based dietary restrictions.  Denies bright red blood per rectum or melena, no dark urine or hematuria.    VAD interrogation & Equipment Management: Speed: 9000 Flow: 5.1 Power: 5.2 PI: 7.3  Alarms: none Events: RarePI events Fixed speed: 9000 Low speed limit: 8400  Primary Controller: Replace back up battery in 61months Back up controller: Replace back up battery in 66months   I reviewed the LVAD parameters from todayand compared the results to the patient's prior recorded data.LVAD interrogation was NEGATIVEfor significant power changes, NEGATIVEfor clinicalalarms and NEGATIVEfor PI events/speed drops.  LVAD equipment check completed and is in good working order. Back-up equipment present. LVAD education done on emergency procedures and precautions and reviewed exit site care.    Past Medical History:  Diagnosis Date  . Automatic implantable cardioverter-defibrillator in situ   . GERD (gastroesophageal reflux disease)   . H/O hiatal hernia   . Insomnia   . Nephrolithiasis   . Nonischemic  cardiomyopathy (HCC)    Cardiac catheterization 12/2011 with LVEF 45%, no significant obstructive CAD  . Osteoarthritis   . Pneumonia    Dec 2016    Current Outpatient Prescriptions  Medication Sig Dispense Refill  . acetaminophen (TYLENOL) 325 MG tablet Take 2 tablets (650 mg total) by mouth every 4 (four) hours as needed for mild pain or moderate pain. (Patient not taking: Reported on 10/25/2015)    . amiodarone (PACERONE) 200 MG tablet Take 1 tablet (200 mg total) by mouth daily. 60 tablet 0  . aspirin EC 81 MG EC tablet Take 1 tablet (81 mg total) by mouth daily.    . calcium carbonate (OSCAL) 1500 (600 Ca) MG TABS tablet Take by mouth daily.    Marland Kitchen Cod Liver Oil 1000 MG CAPS Take by mouth daily.    . ferrous fumarate-b12-vitamic C-folic acid (TRINSICON / FOLTRIN) capsule Take 1 capsule by mouth 3 (three) times daily after meals. 90 capsule 0  . furosemide (LASIX) 20 MG tablet Take 20 mg by mouth every other day. Pt took Thurs, Fri, Sat of this past week.    . Glucosamine Sulfate 1000 MG CAPS Take by mouth daily.    . Multiple Vitamin (MULTIVITAMIN) tablet Take 1 tablet by mouth daily.    . pantoprazole (PROTONIX) 40 MG tablet Take 1 tablet (40 mg total) by mouth daily. 30 tablet 0  . potassium chloride SA (K-DUR,KLOR-CON) 20 MEQ tablet Take 1 tablet (20 mEq total) by mouth daily. 90 tablet 3  . psyllium (REGULOID) 0.52 g capsule Take 0.52 g by mouth 2 (two) times daily after a meal.    . tamsulosin (FLOMAX) 0.4 MG CAPS capsule Take 1  capsule (0.4 mg total) by mouth daily. 30 capsule 0  . warfarin (COUMADIN) 3 MG tablet Take 1 tablet (3 mg total) by mouth daily. 36 tablet 6   No current facility-administered medications for this encounter.     Phenergan [promethazine hcl]; Lasix [furosemide]; and Morphine and related  REVIEW OF SYSTEMS: All systems negative except as listed in HPI, PMH and Problem list.    There were no vitals filed for this visit. Vital Signs: HR: 62 Doppler BP:  88 Automatic BP: 109/70 (82)  SPO2: 99   Physical Exam: GENERAL: Well appearing, male who presents to clinic today in no acute distress. Ambulated in the clinic without difficulty HEENT: normal  NECK: Supple, JVP ~10  2+ bilaterally, no bruits.  No lymphadenopathy or thyromegaly appreciated.   CARDIAC:  Mechanical heart sounds with LVAD hum present.  LUNGS:  Clear to auscultation bilaterally.  ABDOMEN:  Soft, round, nontender, positive bowel sounds x4.     LVAD exit site: well-healed and incorporated.  Dressing with serous exudate noted and mild erythema. At the 9:00 position there is a firm, red moist area.  Stabilization device present and accurately applied.  Driveline dressing is being changed daily per sterile technique. EXTREMITIES:  Warm and dry, no cyanosis, clubbing, rash or R and LLE 1+ edema  NEUROLOGIC:  Alert and oriented. Does not know year.       ASSESSMENT AND PLAN:   1. 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 EF 15-20%. S/p HM-II LVAD 08/30/15 NYHA II-III. Mild volume overload noted but likely because he missed a dose of lasix. Today I have asked him to take lasix 20 mg today and tomorrow with potassium then resume his normal diuretic regimen.  - Continue current meds. Map stable today.  - VAD parents stable. - ICD interrogated personally. In clinic no VT/VF, No AF. Activity level about 1hr/day - VAD parameters and vitals stable.  2. 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 Will need to follow TSH and obtain yearly eye exams.  3. Deconditioning- Continue HHPT HHOT 4.  Anticoagulation management - INR goal 2.0-3.0.  Will evaluate INR value today and follow up with patient for necessary changes.   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 Hopefully these will improve with time May need neuro referral. Continue HH ST.  6. Driveline infection -Completed 10 day course of Keflex. Still with erythema and serous exudate around the driveline. Give  doxycycline 100 mg twice a day x 1 week. Also has an area at the 9:00 position red, firm, and moist. I applied silver nitrate. ? Hypergranulation. Wound culture obtained.   Check labs today. CBC, BMET, INR, LDH--> Stable Follow up 1 week to reassess driveline. Check albumin next visit.  Jakiya Bookbinder NP-C

## 2015-11-01 NOTE — Addendum Note (Signed)
Encounter addended by: Lebron Quam, RN on: 11/01/2015  3:57 PM<BR>    Actions taken: Sign clinical note

## 2015-11-01 NOTE — Patient Instructions (Signed)
1. Start Doxycycline 100 mg twice daily for 7 days. 2. Take 20 mg of Lasix today and tomorrow along with your potassium pill. Then resume your normal schedule of Lasix- Monday, Wednesday and Friday.  3. Will call you with lab results and Coumadin dosing. 4. Return to clinic in 1 week.

## 2015-11-01 NOTE — Addendum Note (Signed)
Encounter addended by: Lebron Quam, RN on: 11/01/2015  3:44 PM<BR>    Actions taken: Vitals modified, Order Reconciliation Section accessed, Home Medications modified, Medication taking status modified, Sign clinical note, Order Entry activity accessed, Medication long-term status modified

## 2015-11-04 LAB — AEROBIC CULTURE W GRAM STAIN (SUPERFICIAL SPECIMEN): Culture: NO GROWTH

## 2015-11-04 LAB — AEROBIC CULTURE  (SUPERFICIAL SPECIMEN)

## 2015-11-07 NOTE — Addendum Note (Signed)
Encounter addended by: Dolores Patty, MD on: 11/07/2015 12:25 AM<BR>    Actions taken: Visit diagnoses modified, LOS modified, Charge Capture section accepted, Sign clinical note

## 2015-11-07 NOTE — Addendum Note (Signed)
Encounter addended by: Dolores Patty, MD on: 11/07/2015 12:38 AM<BR>    Actions taken: LOS modified, Charge Capture section accepted, Sign clinical note

## 2015-11-08 ENCOUNTER — Ambulatory Visit (HOSPITAL_COMMUNITY): Payer: Self-pay | Admitting: Infectious Diseases

## 2015-11-08 ENCOUNTER — Ambulatory Visit (HOSPITAL_COMMUNITY)
Admission: RE | Admit: 2015-11-08 | Discharge: 2015-11-08 | Disposition: A | Discharge status: Home / Self Care | Payer: Medicare Other | Source: Ambulatory Visit | Attending: Cardiology | Admitting: Cardiology

## 2015-11-08 ENCOUNTER — Telehealth (HOSPITAL_COMMUNITY): Payer: Self-pay | Admitting: Vascular Surgery

## 2015-11-08 ENCOUNTER — Other Ambulatory Visit (HOSPITAL_COMMUNITY): Payer: Self-pay | Admitting: Infectious Diseases

## 2015-11-08 DIAGNOSIS — Z4800 Encounter for change or removal of nonsurgical wound dressing: Secondary | ICD-10-CM | POA: Insufficient documentation

## 2015-11-08 DIAGNOSIS — Z792 Long term (current) use of antibiotics: Secondary | ICD-10-CM

## 2015-11-08 DIAGNOSIS — Z95811 Presence of heart assist device: Secondary | ICD-10-CM | POA: Diagnosis not present

## 2015-11-08 LAB — CBC
HCT: 35.5 % — ABNORMAL LOW (ref 39.0–52.0)
Hemoglobin: 10.2 g/dL — ABNORMAL LOW (ref 13.0–17.0)
MCH: 25.3 pg — ABNORMAL LOW (ref 26.0–34.0)
MCHC: 28.7 g/dL — AB (ref 30.0–36.0)
MCV: 88.1 fL (ref 78.0–100.0)
PLATELETS: 110 10*3/uL — AB (ref 150–400)
RBC: 4.03 MIL/uL — ABNORMAL LOW (ref 4.22–5.81)
RDW: 17.1 % — AB (ref 11.5–15.5)
WBC: 4.2 10*3/uL (ref 4.0–10.5)

## 2015-11-08 LAB — PROTIME-INR
INR: 1.63
PROTHROMBIN TIME: 19.5 s — AB (ref 11.4–15.2)

## 2015-11-08 LAB — PREALBUMIN: PREALBUMIN: 17 mg/dL — AB (ref 18–38)

## 2015-11-08 NOTE — Patient Instructions (Addendum)
No more squeeze sticks (CHG sticks) to clean the site. Use ONLY saline wipes - one for the skin around the driveline site and one for the driveline.   Continue every day with the silver. Wound culture did not grow anything.   Come back in 1 week to have [place] see again how it looks.   We will call you with the labs we drew today.   Can try Claritin (10mg  pills) daily for itching. Zyrtec if the Claritin does not work.

## 2015-11-08 NOTE — Telephone Encounter (Signed)
Left pt message.. To give pt appt date and time

## 2015-11-08 NOTE — Progress Notes (Signed)
LVAD RN COORDINATOR ENCOUNTER: Presents today for 1week FU re: drainage to LVAD exit site.   Exit Site Care: Drive line is being maintained daily by his wife. Dressing removed. Driveline site with small amount of brown drainage (see below for drainage over 24h). Still erythematous around site, no tenderness, expressive drainage or foul odor. Previous site where Amy cauterized with silver nitrate healing. Drive line exit site cleaned with sterile NS ONLY d/t fine papular rash under tape, allowed to dry, and gauze dressing with aquacel strip re-applied. NO SKIN PREP. C/O that this rash is very itchy. Exit site healing, the velour is fully implanted at exit site.  No tenderness, drainage, or foul odor noted. Drive line anchor in place correctly. Provided with 5 daily dressing kits and 3 anchorstoday. Resume daily dressings with drainage and moist appearance.     Prealbumin improved compared to 73m ago. No growth to date on the wound culture. Finished course of Doxycycline today. D/W Amy Clegg and will reassess in clinic next week.   [name], RN VAD Coordinator   Office: [phone] 24/7 Emergency VAD Pager: [phone]

## 2015-11-11 ENCOUNTER — Encounter (HOSPITAL_COMMUNITY): Payer: Self-pay | Admitting: Neurology

## 2015-11-11 ENCOUNTER — Emergency Department (HOSPITAL_COMMUNITY)
Admission: EM | Admit: 2015-11-11 | Discharge: 2015-11-11 | Disposition: A | Discharge status: Home / Self Care | Payer: Medicare Other | Attending: Emergency Medicine | Admitting: Emergency Medicine

## 2015-11-11 DIAGNOSIS — Z7901 Long term (current) use of anticoagulants: Secondary | ICD-10-CM | POA: Diagnosis not present

## 2015-11-11 DIAGNOSIS — R040 Epistaxis: Secondary | ICD-10-CM | POA: Insufficient documentation

## 2015-11-11 DIAGNOSIS — Z95810 Presence of automatic (implantable) cardiac defibrillator: Secondary | ICD-10-CM | POA: Diagnosis not present

## 2015-11-11 DIAGNOSIS — Z7982 Long term (current) use of aspirin: Secondary | ICD-10-CM | POA: Diagnosis not present

## 2015-11-11 DIAGNOSIS — I5022 Chronic systolic (congestive) heart failure: Secondary | ICD-10-CM | POA: Diagnosis not present

## 2015-11-11 LAB — CBC WITH DIFFERENTIAL/PLATELET
BASOS PCT: 0 %
Basophils Absolute: 0 10*3/uL (ref 0.0–0.1)
EOS PCT: 2 %
Eosinophils Absolute: 0.1 10*3/uL (ref 0.0–0.7)
HEMATOCRIT: 32.4 % — AB (ref 39.0–52.0)
Hemoglobin: 9.6 g/dL — ABNORMAL LOW (ref 13.0–17.0)
Lymphocytes Relative: 13 %
Lymphs Abs: 0.7 10*3/uL (ref 0.7–4.0)
MCH: 25.5 pg — ABNORMAL LOW (ref 26.0–34.0)
MCHC: 29.6 g/dL — AB (ref 30.0–36.0)
MCV: 86.2 fL (ref 78.0–100.0)
MONO ABS: 0.4 10*3/uL (ref 0.1–1.0)
MONOS PCT: 8 %
NEUTROS ABS: 3.9 10*3/uL (ref 1.7–7.7)
Neutrophils Relative %: 77 %
PLATELETS: 94 10*3/uL — AB (ref 150–400)
RBC: 3.76 MIL/uL — ABNORMAL LOW (ref 4.22–5.81)
RDW: 16.9 % — AB (ref 11.5–15.5)
WBC: 5.1 10*3/uL (ref 4.0–10.5)

## 2015-11-11 LAB — PROTIME-INR
INR: 2.08
PROTHROMBIN TIME: 23.7 s — AB (ref 11.4–15.2)

## 2015-11-11 LAB — I-STAT CHEM 8, ED
BUN: 17 mg/dL (ref 6–20)
CALCIUM ION: 1.19 mmol/L (ref 1.15–1.40)
CREATININE: 0.9 mg/dL (ref 0.61–1.24)
Chloride: 103 mmol/L (ref 101–111)
GLUCOSE: 104 mg/dL — AB (ref 65–99)
HEMATOCRIT: 31 % — AB (ref 39.0–52.0)
Hemoglobin: 10.5 g/dL — ABNORMAL LOW (ref 13.0–17.0)
Potassium: 4.1 mmol/L (ref 3.5–5.1)
Sodium: 142 mmol/L (ref 135–145)
TCO2: 25 mmol/L (ref 0–100)

## 2015-11-11 NOTE — ED Provider Notes (Addendum)
[facility]-EMERGENCY DEPT Provider Note   CSN: [id number] Arrival date & time: 11/11/15  1355     History   Chief Complaint Chief Complaint  Patient presents with  . Epistaxis    HPI [name] is a 75 y.o. male.  HPI Patient developed bleeding from left nostril last night. He is presently asymptomatic. His wife treated him with packing his left nostril. No shortness of breath or lightheadedness No other associated symptoms. Nothing made symptoms better or worse Past Medical History:  Diagnosis Date  . Automatic implantable cardioverter-defibrillator in situ   . GERD (gastroesophageal reflux disease)   . H/O hiatal hernia   . Insomnia   . Nephrolithiasis   . Nonischemic cardiomyopathy (HCC)    Cardiac catheterization 12/2011 with LVEF 45%, no significant obstructive CAD  . Osteoarthritis   . Pneumonia    Dec 2016    Patient Active Problem List   Diagnosis Date Noted  . Volume depletion   . Presence of left ventricular assist device (LVAD) (HCC)   . Chronic systolic CHF (congestive heart failure) (HCC)   . Physical deconditioning 09/19/2015  . 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 Debilitated 09/19/2015  . Other congestive heart failure (HCC)   . Encephalopathy   . H/O chest tube placement   . LBBB (left bundle branch block)   . Generalized OA   . Prediabetes   . Acute delirium   . LVAD (left ventricular assist device) present (HCC)   . Insomnia   . Protein-calorie malnutrition, severe 08/23/2015  . Palliative care encounter   . Goals of care, counseling/discussion   . Acute on chronic systolic (congestive) heart failure (HCC) 08/21/2015  . Acute on chronic systolic CHF (congestive heart failure) (HCC) 08/21/2015  . PVC (premature ventricular contraction) 01/04/2015  . 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 Symptomatic bradycardia 11/28/2012  . 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 Chronic systolic heart failure (HCC) 10/27/2012  .  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 Left bundle branch block 10/06/2012  . Syncope 10/06/2012    Past Surgical History:  Procedure Laterality Date  . APPENDECTOMY    . BI-VENTRICULAR IMPLANTABLE CARDIOVERTER DEFIBRILLATOR N/A 12/14/2012   Procedure: BI-VENTRICULAR IMPLANTABLE CARDIOVERTER DEFIBRILLATOR  (CRT-D);  Surgeon: Duke Salvia, MD;  Location: [facility] CATH LAB;  Service: Cardiovascular;  Laterality: N/A;  . BI-VENTRICULAR IMPLANTABLE CARDIOVERTER DEFIBRILLATOR  (CRT-D)  12/14/2012  . CARDIAC CATHETERIZATION  2003?; ~ 11/2012  . CARDIAC CATHETERIZATION N/A 08/22/2015   Procedure: Right/Left Heart Cath and Coronary Angiography;  Surgeon: Dolores Patty, MD;  Location: [facility] INVASIVE CV LAB;  Service: Cardiovascular;  Laterality: N/A;  . CATARACT EXTRACTION W/PHACO Left 05/13/2015   Procedure: CATARACT EXTRACTION PHACO AND INTRAOCULAR LENS PLACEMENT LEFT EYE;  CDE:  3.49;  Surgeon: Susa Simmonds, MD;  Location: [facility] ORS;  Service: Ophthalmology;  Laterality: Left;  . CATARACT EXTRACTION W/PHACO Right 07/22/2015   Procedure: CATARACT EXTRACTION PHACO AND INTRAOCULAR LENS PLACEMENT RIGHT EYE CDE=4.50;  Surgeon: Susa Simmonds, MD;  Location: [facility] ORS;  Service: Ophthalmology;  Laterality: Right;  . CYSTOSCOPY W/ STONE MANIPULATION     "3-4 times" (12/14/2012)  . EXPLORATORY LAPAROTOMY  2006?  . INSERTION OF IMPLANTABLE LEFT VENTRICULAR ASSIST DEVICE N/A 08/30/2015   Procedure: INSERTION OF IMPLANTABLE LEFT VENTRICULAR ASSIST DEVICE;  Surgeon: Kerin Perna, MD;  Location: [facility] OR;  Service: Open Heart Surgery;  Laterality: N/A;  . INTRAOPERATIVE TRANSESOPHAGEAL ECHOCARDIOGRAM N/A 08/30/2015   Procedure: INTRAOPERATIVE TRANSESOPHAGEAL ECHOCARDIOGRAM;  Surgeon: Kathlee Nations Trigt,  MD;  Location: [facility] OR;  Service: Open Heart Surgery;  Laterality: N/A;  . KNEE ARTHROSCOPY Left 1990's  . LITHOTRIPSY     "twice" (12/14/2012)  . POSTERIOR FUSION CERVICAL SPINE  2001  . VOLVULUS REDUCTION  2007        Home Medications    Prior to Admission medications   Medication Sig Start Date End Date Taking? Authorizing Provider  acetaminophen (TYLENOL) 325 MG tablet Take 2 tablets (650 mg total) by mouth every 4 (four) hours as needed for mild pain or moderate pain. Patient not taking: Reported on 11/01/2015 10/01/15   Jacquelynn CreePamela S Love, PA-C  aspirin EC 81 MG EC tablet Take 1 tablet (81 mg total) by mouth daily. 09/19/15   Amy D Clegg, NP  calcium carbonate (OSCAL) 1500 (600 Ca) MG TABS tablet Take by mouth daily.    Historical Provider, MD  [facility] Liver Oil 1000 MG CAPS Take by mouth daily.    Historical Provider, MD  ferrous fumarate-b12-vitamic C-folic acid (TRINSICON / FOLTRIN) capsule Take 1 capsule by mouth 3 (three) times daily after meals. 10/25/15   Dolores Pattyaniel R Bensimhon, MD  furosemide (LASIX) 20 MG tablet Take 20 mg by mouth every other day. Pt took Thurs, Fri, Sat of this past week.    Historical Provider, MD  Glucosamine Sulfate 1000 MG CAPS Take by mouth daily.    Historical Provider, MD  Multiple Vitamin (MULTIVITAMIN) tablet Take 1 tablet by mouth daily.    Historical Provider, MD  pantoprazole (PROTONIX) 40 MG tablet Take 1 tablet (40 mg total) by mouth daily. 10/02/15   Evlyn KannerPamela S Love, PA-C  potassium chloride SA (K-DUR,KLOR-CON) 20 MEQ tablet Take 1 tablet (20 mEq total) by mouth daily. 10/10/15 01/08/16  Bevelyn Bucklesaniel R Bensimhon, MD  psyllium (REGULOID) 0.52 g capsule Take 0.52 g by mouth 2 (two) times daily after a meal.    Historical Provider, MD  tamsulosin (FLOMAX) 0.4 MG CAPS capsule Take 1 capsule (0.4 mg total) by mouth daily. 11/01/15   Amy D Filbert Schilderlegg, NP  warfarin (COUMADIN) 3 MG tablet Take 1 tablet (3 mg total) by mouth daily. 10/02/15   Graciella FreerMichael Andrew Tillery, PA-C  warfarin (COUMADIN) 6 MG tablet Take 1 tablet (6 mg total) by mouth daily. 11/01/15   Amy Georgie Chard Clegg, NP    Family History Family History  Problem Relation Age of Onset  . Heart failure Mother   . Stroke Mother   . Asthma  Mother   . Heart disease Father   . Heart failure Father   . Congestive Heart Failure Brother     @ 62 Heart transplant, [place]  . Congestive Heart Failure Brother     ICD  . Heart failure Sister     ICD  . Cancer - Ovarian Sister     deceased @ 7248  . Cancer - Prostate Maternal Uncle     Social History Social History  Substance Use Topics  . Smoking status: Never Smoker  . Smokeless tobacco: Current User    Types: Chew  . Alcohol use No     Allergies   Phenergan [promethazine hcl]; Lasix [furosemide]; and Morphine and related   Review of Systems Review of Systems  Constitutional: Negative.   HENT: Positive for nosebleeds.   Respiratory: Negative.   Cardiovascular:       LVAD  Gastrointestinal: Negative.   Musculoskeletal: Negative.   Skin: Negative.   Allergic/Immunologic: Negative.   Neurological: Negative.   Hematological: Bruises/bleeds easily.  Psychiatric/Behavioral: Negative.  All other systems reviewed and are negative.    Physical Exam Updated Vital Signs BP 108/86 (BP Location: Right Arm)   Pulse 63   Temp 97.8 F (36.6 C) (Oral)   Resp 16   SpO2 100%   Physical Exam  Constitutional: He appears well-developed and well-nourished.  HENT:  Head: Normocephalic and atraumatic.  Dried blood at left nostril no bleeding site visualized. Right nostril inspected with headlamp and nasal speculum. Tiny bleeding site visualized at distal septum  Eyes: Conjunctivae are normal. Pupils are equal, round, and reactive to light.  Neck: Neck supple. No tracheal deviation present. No thyromegaly present.  Cardiovascular:  No murmur heard. Heart Sounds absent peripheral pulses absent  Pulmonary/Chest: Effort normal and breath sounds normal.  Abdominal: Soft. Bowel sounds are normal. He exhibits no distension. There is no tenderness.  Musculoskeletal: Normal range of motion. He exhibits no edema or tenderness.  Neurological: He is alert. Coordination normal.  Skin:  Skin is warm and dry. No rash noted.  Psychiatric: He has a normal mood and affect. His behavior is normal.  Nursing note and vitals reviewed.    ED Treatments / Results  Labs (all labs ordered are listed, but only abnormal results are displayed) Labs Reviewed  PROTIME-INR - Abnormal; Notable for the following:       Result Value   Prothrombin Time 23.7 (*)    All other components within normal limits  CBC WITH DIFFERENTIAL/PLATELET - Abnormal; Notable for the following:    RBC 3.76 (*)    Hemoglobin 9.6 (*)    HCT 32.4 (*)    MCH 25.5 (*)    MCHC 29.6 (*)    RDW 16.9 (*)    Platelets 94 (*)    All other components within normal limits  I-STAT CHEM 8, ED - Abnormal; Notable for the following:    Glucose, Bld 104 (*)    Hemoglobin 10.5 (*)    HCT 31.0 (*)    All other components within normal limits   Results for orders placed or performed during the hospital encounter of 11/11/15  Protime-INR  Result Value Ref Range   Prothrombin Time 23.7 (H) 11.4 - 15.2 seconds   INR 2.08   CBC with Differential  Result Value Ref Range   WBC 5.1 4.0 - 10.5 K/uL   RBC 3.76 (L) 4.22 - 5.81 MIL/uL   Hemoglobin 9.6 (L) 13.0 - 17.0 g/dL   HCT 96.0 (L) 45.4 - 09.8 %   MCV 86.2 78.0 - 100.0 fL   MCH 25.5 (L) 26.0 - 34.0 pg   MCHC 29.6 (L) 30.0 - 36.0 g/dL   RDW 11.9 (H) 14.7 - 82.9 %   Platelets 94 (L) 150 - 400 K/uL   Neutrophils Relative % 77 %   Neutro Abs 3.9 1.7 - 7.7 K/uL   Lymphocytes Relative 13 %   Lymphs Abs 0.7 0.7 - 4.0 K/uL   Monocytes Relative 8 %   Monocytes Absolute 0.4 0.1 - 1.0 K/uL   Eosinophils Relative 2 %   Eosinophils Absolute 0.1 0.0 - 0.7 K/uL   Basophils Relative 0 %   Basophils Absolute 0.0 0.0 - 0.1 K/uL  I-Stat Chem 8, ED  Result Value Ref Range   Sodium 142 135 - 145 mmol/L   Potassium 4.1 3.5 - 5.1 mmol/L   Chloride 103 101 - 111 mmol/L   BUN 17 6 - 20 mg/dL   Creatinine, Ser 5.62 0.61 - 1.24 mg/dL   Glucose, Bld  104 (H) 65 - 99 mg/dL   Calcium,  Ion 2.35 3.61 - 1.40 mmol/L   TCO2 25 0 - 100 mmol/L   Hemoglobin 10.5 (L) 13.0 - 17.0 g/dL   HCT 44.3 (L) 15.4 - 00.8 %   Dg Chest 2 View  Result Date: 11/01/2015 CLINICAL DATA:  Left ventricular assist device, implantation date August 30, 2015. EXAM: CHEST  2 VIEW COMPARISON:  PA and lateral chest x-ray of September 26, 2015 FINDINGS: The lungs are well-expanded. There is subtle stable increased density posteriorly in the left lower lobe. There is no pleural effusion. The left ventricular assist device is in stable position. The heart is top-normal in size. The pulmonary vascularity is normal. The mediastinum is normal in width. There is calcification in the wall of the aortic arch. The implantable pacemaker defibrillator is in stable position. IMPRESSION: No CHF nor other acute cardiopulmonary abnormality. The left ventricular assist device and AICD are in stable position. Aortic atherosclerosis. Electronically Signed   By: David  [place] M.D.   On: 11/01/2015 13:42   EKG  EKG Interpretation None       Radiology No results found.  Procedures Procedures (including critical care time)  Medications Ordered in ED Medications - No data to display Procedure :timeout performed. Left nostril bleeding site was cauterized with silver nitrate stick . No further episodes of bleeding after patient observed and ambulated in the emergency department. Initial Impression / Assessment and Plan / ED Course  I have reviewed the triage vital signs and the nursing notes.  Pertinent labs & imaging results that were available during my care of the patient were reviewed by me and considered in my medical decision making (see chart for details).  Clinical Course    Patient therapeutic on Coumadin. Hemoglobin is at baseline . asymptomatic on discharge Plan saline nasal spray. Return if needed Final Clinical Impressions(s) / ED Diagnoses  Diagnosis anterior epistaxis Final diagnoses:  None    New  Prescriptions New Prescriptions   No medications on file     Doug Sou, MD 11/11/15 1634    Doug Sou, MD 11/11/15 1635

## 2015-11-11 NOTE — ED Notes (Signed)
Dr. Jacubowitz at bedside 

## 2015-11-11 NOTE — Discharge Instructions (Signed)
Get saline nasal spray and squirt one time into each nostril every 2 hours while awake. If you need to cough or sneeze try to do it through mouth instead of your nose. If your nose rebleeds, pinch your nostrils shut for 20 minutes while sitting upright in a chair. If you can't get the bleeding to stop, return to the emergency department.

## 2015-11-11 NOTE — ED Notes (Signed)
Pt resting, no active bleeding from nose at this time

## 2015-11-11 NOTE — ED Triage Notes (Signed)
Pt has LVAD, has had nosebleed since Sunday morning. Is taking coumadin. Bleeding stopped at this time. Is a x 4. In NAD. Wife at bedside.

## 2015-11-11 NOTE — ED Notes (Signed)
Pt ambulatory to bathroom without any problems 

## 2015-11-13 ENCOUNTER — Other Ambulatory Visit (HOSPITAL_COMMUNITY): Payer: Self-pay | Admitting: Unknown Physician Specialty

## 2015-11-13 DIAGNOSIS — Z7901 Long term (current) use of anticoagulants: Secondary | ICD-10-CM

## 2015-11-13 DIAGNOSIS — Z95811 Presence of heart assist device: Secondary | ICD-10-CM

## 2015-11-15 ENCOUNTER — Telehealth (HOSPITAL_COMMUNITY): Payer: Self-pay | Admitting: *Deleted

## 2015-11-15 ENCOUNTER — Ambulatory Visit (HOSPITAL_COMMUNITY)
Admission: RE | Admit: 2015-11-15 | Discharge: 2015-11-15 | Disposition: A | Discharge status: Home / Self Care | Payer: Medicare Other | Source: Ambulatory Visit | Attending: Cardiology | Admitting: Cardiology

## 2015-11-15 ENCOUNTER — Ambulatory Visit (HOSPITAL_COMMUNITY): Payer: Self-pay | Admitting: Infectious Diseases

## 2015-11-15 DIAGNOSIS — Z7901 Long term (current) use of anticoagulants: Secondary | ICD-10-CM | POA: Diagnosis not present

## 2015-11-15 DIAGNOSIS — Z95811 Presence of heart assist device: Secondary | ICD-10-CM

## 2015-11-15 LAB — PROTIME-INR
INR: 2.26
PROTHROMBIN TIME: 25.3 s — AB (ref 11.4–15.2)

## 2015-11-15 MED ORDER — WARFARIN SODIUM 3 MG PO TABS: 3.0000 mg | ORAL_TABLET | Freq: Every day | ORAL | 3 refills | Status: DC

## 2015-11-15 MED ORDER — AMOXICILLIN 500 MG PO CAPS: 2000.0000 mg | ORAL_CAPSULE | Freq: Once | ORAL | 0 refills | Status: AC | PRN

## 2015-11-15 NOTE — Patient Instructions (Addendum)
Take 4 capsules (2000 mg) of Amoxicillin 30 - 60 minutes before any dental cleaning or procedure.   Can do dressing changes every other day now (next dressing change will be due Sunday). Continue cleaning as you have been doing (no cleaning sticks or skin preps). Looks much better!  If his weight goes up > 3 lbs overnight or 5 lbs over a week or you see visible swelling in his legs/ankles - this is a sign he is retaining extra fluid. This is the time you want to give him a lasix and potassium.   Return to see [place] in 2 weeks - garage code is [id number].   INR perfect today at 2.26 (3 mg strength)  Sunday - 1.5 pills   Monday - 1 pill   Tuesday - 1.5 pills  Wednesday - 1 pill  Thursday - 1.5 pills   Friday - 1 pill  Saturday - 1.5 pills

## 2015-11-15 NOTE — Telephone Encounter (Signed)
Debbie called to get orders to continue PT another 2 weeks, she states pt is really doing well and is ready to start cardiac rehab as soon as they can get him in, she just doesn't want to stopped PT and then he not get into rehab for another few weeks.  Gave ok to extend PT another 2 weeks.  Also called cardiac rehab and let them know to please get pt in soon, they will call him next week to schedule

## 2015-11-15 NOTE — Progress Notes (Signed)
LVAD RN COORDINATOR ENCOUNTER: Presents today for 1week FU re: drainage to LVAD exit site. Last visit we stopped the CHG swabs d/t allergic presentation. Drive line is being maintained daily by his wife daily.   Exit Site Care: Dressing removed. Driveline site with small amount of brown drainage (see below for drainage over 24h). Improved erythema around site, no tenderness, expressive drainage or foul odor. Scant clear drainage to the Aquacel ribbon which is improved since last visit. Previous site where Amy cauterized with silver nitrate healing. Papular rash improved and is no longer complaining of itching. Has been using OTC hydrocortisone cream to areas around dressing periphery away from DL with improvement - advised to use this PRN and nowhere under dressing.     Drive line exit site cleaned with sterile NS ONLY, allowed to dry, and gauze dressing with aquacel strip re-applied. NO SKIN PREP. Exit site healing, the velour is fully implanted at exit site. Drive line anchor in place correctly. Provided with 9 daily dressing kits (one applied in clinic) and 4 anchors today.   Also discussed at clinic today dental prophylaxis for upcoming cleaning - Rx sent for Amoxicillin 2g to pharmacy. Reinforced daily weights and went over when to take lasix + KCl (has been doing PRN) for weight gain > 3 lbs in 24h or > 5 lbs in 1 week.   He is upset that he cannot drive - explained that this is not due to physical limitations but to cognitive limitations. Explained that he may ride in the front seat now but is not to drive until further discussion with the MD. He is unable to tell me what the equipment is around his neck is for exactly nor how long he will need it.   Advance to every other day dressing changes - RTC in 2 weeks for reassessment and VAD MD Visit.   [name], RN VAD Coordinator   Office: [phone] 24/7 Emergency VAD Pager: [phone]

## 2015-11-19 DIAGNOSIS — I110 Hypertensive heart disease with heart failure: Secondary | ICD-10-CM | POA: Diagnosis not present

## 2015-11-19 DIAGNOSIS — Z48812 Encounter for surgical aftercare following surgery on the circulatory system: Secondary | ICD-10-CM

## 2015-11-19 DIAGNOSIS — I255 Ischemic cardiomyopathy: Secondary | ICD-10-CM | POA: Diagnosis not present

## 2015-11-19 DIAGNOSIS — I5022 Chronic systolic (congestive) heart failure: Secondary | ICD-10-CM

## 2015-11-20 ENCOUNTER — Other Ambulatory Visit (HOSPITAL_COMMUNITY): Payer: Self-pay | Admitting: *Deleted

## 2015-11-25 ENCOUNTER — Telehealth (HOSPITAL_COMMUNITY): Payer: Self-pay | Admitting: Unknown Physician Specialty

## 2015-11-25 NOTE — Telephone Encounter (Signed)
Pts wife paged re: Carney Bern states that the pt has had persistent nosebleeds for the past 3 days. She states that the nose will bleed for a little bit and stop and then start again the next day. Pts wife was instructed to pack the nose and leave the packing to ensure that it will clot. Carney Bern verbalized understanding. Carney Bern also instructed that if bleeding does not stop to call back as we may need to check the pts INR to ensure it is not supratherapeutic.  Carney Bern verbalized understanding of all instructions.

## 2015-11-27 ENCOUNTER — Emergency Department (HOSPITAL_COMMUNITY)
Admission: EM | Admit: 2015-11-27 | Discharge: 2015-11-27 | Disposition: A | Discharge status: Home / Self Care | Payer: Medicare Other | Attending: Emergency Medicine | Admitting: Emergency Medicine

## 2015-11-27 ENCOUNTER — Encounter (HOSPITAL_COMMUNITY): Payer: Self-pay | Admitting: *Deleted

## 2015-11-27 DIAGNOSIS — Z7982 Long term (current) use of aspirin: Secondary | ICD-10-CM | POA: Insufficient documentation

## 2015-11-27 DIAGNOSIS — Z95810 Presence of automatic (implantable) cardiac defibrillator: Secondary | ICD-10-CM | POA: Diagnosis not present

## 2015-11-27 DIAGNOSIS — Z7901 Long term (current) use of anticoagulants: Secondary | ICD-10-CM | POA: Diagnosis not present

## 2015-11-27 DIAGNOSIS — I5023 Acute on chronic systolic (congestive) heart failure: Secondary | ICD-10-CM | POA: Insufficient documentation

## 2015-11-27 DIAGNOSIS — R040 Epistaxis: Secondary | ICD-10-CM

## 2015-11-27 DIAGNOSIS — Z79899 Other long term (current) drug therapy: Secondary | ICD-10-CM | POA: Diagnosis not present

## 2015-11-27 LAB — CBC WITH DIFFERENTIAL/PLATELET
BASOS PCT: 1 %
Basophils Absolute: 0 10*3/uL (ref 0.0–0.1)
EOS ABS: 0.2 10*3/uL (ref 0.0–0.7)
EOS PCT: 4 %
HCT: 35.1 % — ABNORMAL LOW (ref 39.0–52.0)
HEMOGLOBIN: 10.2 g/dL — AB (ref 13.0–17.0)
Lymphocytes Relative: 25 %
Lymphs Abs: 1 10*3/uL (ref 0.7–4.0)
MCH: 25.4 pg — ABNORMAL LOW (ref 26.0–34.0)
MCHC: 29.1 g/dL — ABNORMAL LOW (ref 30.0–36.0)
MCV: 87.5 fL (ref 78.0–100.0)
Monocytes Absolute: 0.3 10*3/uL (ref 0.1–1.0)
Monocytes Relative: 7 %
NEUTROS PCT: 63 %
Neutro Abs: 2.5 10*3/uL (ref 1.7–7.7)
PLATELETS: 93 10*3/uL — AB (ref 150–400)
RBC: 4.01 MIL/uL — AB (ref 4.22–5.81)
RDW: 16.6 % — ABNORMAL HIGH (ref 11.5–15.5)
WBC: 3.9 10*3/uL — AB (ref 4.0–10.5)

## 2015-11-27 LAB — COMPREHENSIVE METABOLIC PANEL
ALBUMIN: 3.7 g/dL (ref 3.5–5.0)
ALT: 16 U/L — AB (ref 17–63)
AST: 22 U/L (ref 15–41)
Alkaline Phosphatase: 55 U/L (ref 38–126)
Anion gap: 7 (ref 5–15)
BILIRUBIN TOTAL: 0.8 mg/dL (ref 0.3–1.2)
BUN: 16 mg/dL (ref 6–20)
CO2: 27 mmol/L (ref 22–32)
Calcium: 9.2 mg/dL (ref 8.9–10.3)
Chloride: 107 mmol/L (ref 101–111)
Creatinine, Ser: 0.95 mg/dL (ref 0.61–1.24)
GFR calc Af Amer: >60 mL/min (ref >60)
Glucose, Bld: 124 mg/dL — ABNORMAL HIGH (ref 65–99)
POTASSIUM: 3.8 mmol/L (ref 3.5–5.1)
SODIUM: 141 mmol/L (ref 135–145)
TOTAL PROTEIN: 6.7 g/dL (ref 6.5–8.1)

## 2015-11-27 LAB — SAMPLE TO BLOOD BANK

## 2015-11-27 LAB — PROTIME-INR
INR: 2.14
PROTHROMBIN TIME: 24.3 s — AB (ref 11.4–15.2)

## 2015-11-27 NOTE — ED Provider Notes (Signed)
[facility]-EMERGENCY DEPT Provider Note   CSN: [id number] Arrival date & time: 11/27/15  0148   By signing my name below, I, Freida Busman, attest that this documentation has been prepared under the direction and in the presence of Gilda Crease, MD . Electronically Signed: Freida Busman, Scribe. 11/27/2015. 2:32 AM.   History   Chief Complaint Chief Complaint  Patient presents with  . Epistaxis    The history is provided by the patient. No language interpreter was used.   HPI Comments:  [name] is a 75 y.o. male who presents to the Emergency Department complaining of intermittent episodes of epistaxis x 4 days; heavy today. Each episode has lasted for several hours. He reports bleeding from the left nostril with each episode. Pt has packed his nose multiple times at home with moderate relief. Pt reports associated nausea and HA. He denies weakness and dizziness. Pt is currently on coumadin.   Past Medical History:  Diagnosis Date  . Automatic implantable cardioverter-defibrillator in situ   . GERD (gastroesophageal reflux disease)   . H/O hiatal hernia   . Insomnia   . Nephrolithiasis   . Nonischemic cardiomyopathy (HCC)    Cardiac catheterization 12/2011 with LVEF 45%, no significant obstructive CAD  . Osteoarthritis   . Pneumonia    Dec 2016    Patient Active Problem List   Diagnosis Date Noted  . Volume depletion   . Presence of left ventricular assist device (LVAD) (HCC)   . Chronic systolic CHF (congestive heart failure) (HCC)   . Physical deconditioning 09/19/2015  . 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 Debilitated 09/19/2015  . Other congestive heart failure (HCC)   . Encephalopathy   . H/O chest tube placement   . LBBB (left bundle branch block)   . Generalized OA   . Prediabetes   . Acute delirium   . LVAD (left ventricular assist device) present (HCC)   . Insomnia   . Protein-calorie malnutrition, severe 08/23/2015  . Palliative  care encounter   . Goals of care, counseling/discussion   . Acute on chronic systolic (congestive) heart failure (HCC) 08/21/2015  . Acute on chronic systolic CHF (congestive heart failure) (HCC) 08/21/2015  . PVC (premature ventricular contraction) 01/04/2015  . 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 Symptomatic bradycardia 11/28/2012  . 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 Chronic systolic heart failure (HCC) 10/27/2012  . 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 Left bundle branch block 10/06/2012  . Syncope 10/06/2012    Past Surgical History:  Procedure Laterality Date  . APPENDECTOMY    . BI-VENTRICULAR IMPLANTABLE CARDIOVERTER DEFIBRILLATOR N/A 12/14/2012   Procedure: BI-VENTRICULAR IMPLANTABLE CARDIOVERTER DEFIBRILLATOR  (CRT-D);  Surgeon: Duke Salvia, MD;  Location: [facility] CATH LAB;  Service: Cardiovascular;  Laterality: N/A;  . BI-VENTRICULAR IMPLANTABLE CARDIOVERTER DEFIBRILLATOR  (CRT-D)  12/14/2012  . CARDIAC CATHETERIZATION  2003?; ~ 11/2012  . CARDIAC CATHETERIZATION N/A 08/22/2015   Procedure: Right/Left Heart Cath and Coronary Angiography;  Surgeon: Dolores Patty, MD;  Location: [facility] INVASIVE CV LAB;  Service: Cardiovascular;  Laterality: N/A;  . CATARACT EXTRACTION W/PHACO Left 05/13/2015   Procedure: CATARACT EXTRACTION PHACO AND INTRAOCULAR LENS PLACEMENT LEFT EYE;  CDE:  3.49;  Surgeon: Susa Simmonds, MD;  Location: [facility] ORS;  Service: Ophthalmology;  Laterality: Left;  . CATARACT EXTRACTION W/PHACO Right 07/22/2015   Procedure: CATARACT EXTRACTION PHACO AND INTRAOCULAR LENS PLACEMENT RIGHT EYE CDE=4.50;  Surgeon: Susa Simmonds, MD;  Location: [facility] ORS;  Service: Ophthalmology;  Laterality:  Right;  Marland Kitchen. CYSTOSCOPY W/ STONE MANIPULATION     "3-4 times" (12/14/2012)  . EXPLORATORY LAPAROTOMY  2006?  . INSERTION OF IMPLANTABLE LEFT VENTRICULAR ASSIST DEVICE N/A 08/30/2015   Procedure: INSERTION OF IMPLANTABLE LEFT VENTRICULAR ASSIST DEVICE;  Surgeon: Kerin PernaPeter Van  Trigt, MD;  [facility];  Service: Open Heart Surgery;  Laterality: N/A;  . INTRAOPERATIVE TRANSESOPHAGEAL ECHOCARDIOGRAM N/A 08/30/2015   Procedure: INTRAOPERATIVE TRANSESOPHAGEAL ECHOCARDIOGRAM;  Surgeon: Kerin PernaPeter Van Trigt, MD;  [facility];  Service: Open Heart Surgery;  Laterality: N/A;  . KNEE ARTHROSCOPY Left 1990's  . LITHOTRIPSY     "twice" (12/14/2012)  . POSTERIOR FUSION CERVICAL SPINE  2001  . VOLVULUS REDUCTION  2007       Home Medications    Prior to Admission medications   Medication Sig Start Date End Date Taking? Authorizing Provider  acetaminophen (TYLENOL) 325 MG tablet Take 2 tablets (650 mg total) by mouth every 4 (four) hours as needed for mild pain or moderate pain. Patient not taking: Reported on 11/01/2015 10/01/15   Evlyn KannerPamela S Love, PA-C  amoxicillin (AMOXIL) 500 MG capsule Take 4 capsules (2,000 mg total) by mouth once as needed (Take 30-60 minutes before dental cleaning/procedure). 11/15/15   Dolores Pattyaniel R Bensimhon, MD  aspirin EC 81 MG EC tablet Take 1 tablet (81 mg total) by mouth daily. 09/19/15   Amy D Clegg, NP  calcium carbonate (OSCAL) 1500 (600 Ca) MG TABS tablet Take by mouth daily.    Historical Provider, MD  [facility] Liver Oil 1000 MG CAPS Take by mouth daily.    Historical Provider, MD  ferrous fumarate-b12-vitamic C-folic acid (TRINSICON / FOLTRIN) capsule Take 1 capsule by mouth 3 (three) times daily after meals. 10/25/15   Dolores Pattyaniel R Bensimhon, MD  furosemide (LASIX) 20 MG tablet Take 20 mg by mouth every other day. Pt took Thurs, Fri, Sat of this past week.    Historical Provider, MD  Glucosamine Sulfate 1000 MG CAPS Take by mouth daily.    Historical Provider, MD  Multiple Vitamin (MULTIVITAMIN) tablet Take 1 tablet by mouth daily.    Historical Provider, MD  pantoprazole (PROTONIX) 40 MG tablet Take 1 tablet (40 mg total) by mouth daily. 10/02/15   Evlyn KannerPamela S Love, PA-C  potassium chloride SA (K-DUR,KLOR-CON) 20 MEQ tablet Take 1 tablet (20 mEq total) by mouth  daily. 10/10/15 01/08/16  Bevelyn Bucklesaniel R Bensimhon, MD  psyllium (REGULOID) 0.52 g capsule Take 0.52 g by mouth 2 (two) times daily after a meal.    Historical Provider, MD  tamsulosin (FLOMAX) 0.4 MG CAPS capsule Take 1 capsule (0.4 mg total) by mouth daily. 11/01/15   Amy D Filbert Schilderlegg, NP  warfarin (COUMADIN) 3 MG tablet Take 1 tablet (3 mg total) by mouth daily. Take 2 pill daily or as directed by LVAD Clinic. 11/15/15   Dolores Pattyaniel R Bensimhon, MD  warfarin (COUMADIN) 6 MG tablet Take 1 tablet (6 mg total) by mouth daily. 11/01/15   Amy Georgie Chard Clegg, NP    Family History Family History  Problem Relation Age of Onset  . Heart failure Mother   . Stroke Mother   . Asthma Mother   . Heart disease Father   . Heart failure Father   . Congestive Heart Failure Brother     @ 62 Heart transplant, [place]  . Congestive Heart Failure Brother     ICD  . Heart failure Sister     ICD  . Cancer - Ovarian Sister     deceased @  48  . Cancer - Prostate Maternal Uncle     Social History Social History  Substance Use Topics  . Smoking status: Never Smoker  . Smokeless tobacco: Current User    Types: Chew  . Alcohol use No     Allergies   Phenergan [promethazine hcl]; Lasix [furosemide]; and Morphine and related   Review of Systems Review of Systems  HENT: Positive for nosebleeds.   Gastrointestinal: Positive for nausea. Negative for vomiting.  Neurological: Positive for headaches. Negative for dizziness and weakness.  All other systems reviewed and are negative.   Physical Exam Updated Vital Signs BP 109/78 (BP Location: Left Arm)   Pulse (!) 59   Temp 97.5 F (36.4 C) (Oral)   Resp 21   Ht 6\' 2"  (1.88 m)   Wt 172 lb (78 kg)   SpO2 100%   BMI 22.08 kg/m   Physical Exam  Constitutional: He is oriented to person, place, and time. He appears well-developed and well-nourished. No distress.  HENT:  Head: Normocephalic and atraumatic.  Right Ear: Hearing normal.  Left Ear: Hearing normal.  Nose: Nose  normal.  Mouth/Throat: Oropharynx is clear and moist and mucous membranes are normal.  Bleeding from left nare  Eyes: Conjunctivae and EOM are normal. Pupils are equal, round, and reactive to light.  Neck: Normal range of motion. Neck supple.  Cardiovascular: Regular rhythm, S1 normal and S2 normal.  Exam reveals no gallop and no friction rub.   No murmur heard. Pulmonary/Chest: Effort normal and breath sounds normal. No respiratory distress. He exhibits no tenderness.  Abdominal: Soft. Normal appearance and bowel sounds are normal. There is no hepatosplenomegaly. There is no tenderness. There is no rebound, no guarding, no tenderness at McBurney's point and negative Murphy's sign. No hernia.  Musculoskeletal: Normal range of motion.  Neurological: He is alert and oriented to person, place, and time. He has normal strength. No cranial nerve deficit or sensory deficit. Coordination normal. GCS eye subscore is 4. GCS verbal subscore is 5. GCS motor subscore is 6.  Skin: Skin is warm, dry and intact. No rash noted. No cyanosis.  Psychiatric: He has a normal mood and affect. His speech is normal and behavior is normal. Thought content normal.  Nursing note and vitals reviewed.    ED Treatments / Results  DIAGNOSTIC STUDIES:  Oxygen Saturation is 98% on RA, normal by my interpretation.    COORDINATION OF CARE:  2:21 AM Discussed treatment plan with pt at bedside and pt agreed to plan.  Labs (all labs ordered are listed, but only abnormal results are displayed) Labs Reviewed  CBC WITH DIFFERENTIAL/PLATELET - Abnormal; Notable for the following:       Result Value   WBC 3.9 (*)    RBC 4.01 (*)    Hemoglobin 10.2 (*)    HCT 35.1 (*)    MCH 25.4 (*)    MCHC 29.1 (*)    RDW 16.6 (*)    Platelets 93 (*)    All other components within normal limits  COMPREHENSIVE METABOLIC PANEL - Abnormal; Notable for the following:    Glucose, Bld 124 (*)    ALT 16 (*)    All other components within  normal limits  PROTIME-INR - Abnormal; Notable for the following:    Prothrombin Time 24.3 (*)    All other components within normal limits  SAMPLE TO BLOOD BANK    EKG  EKG Interpretation None       Radiology No  results found.  Procedures Procedures (including critical care time)  Medications Ordered in ED Medications - No data to display   Initial Impression / Assessment and Plan / ED Course  I have reviewed the triage vital signs and the nursing notes.  Pertinent labs & imaging results that were available during my care of the patient were reviewed by me and considered in my medical decision making (see chart for details).  Clinical Course    Patient presents with complaints of bleeding from the left side of his nose for the last 4 days. He has had episodes of bleeding have lasted 4-8 hours at a time. He has been holding pressure but it continues to bleed. Patient reports tonight bleeding became more heavy and he was having large clots go down the back of his throat.  Presentation is complicated by the fact that he has a ventricular assist device and is on Coumadin. Lab work was performed and his INR is therapeutic. His hemoglobin is at baseline. He is not having weakness, dizziness, chest pain or shortness of breath.  Patient was discussed with Dr. Gala Romney, his cardiologist. He did not feel that the patient would require warrant observation at this time because he is otherwise stable. He recommends holding aspirin for several days but continuing Coumadin. Patient will be referred to ENT. He will need removal of the packing in 3-5 days. All recent evidence shows that patients with nasal packing do not require antibiotic therapy. As the patient is on Coumadin, antibiotics might complicate his bleeding. This was discussed with Dr. Gala Romney as well and he did not feel that the ventricular assist device made him at any higher risk/need for antibiotic.  Final Clinical  Impressions(s) / ED Diagnoses   Final diagnoses:  Epistaxis    New Prescriptions New Prescriptions   No medications on file   I personally performed the services described in this documentation, which was scribed in my presence. The recorded information has been reviewed and is accurate.     Gilda Crease, MD 11/27/15 [phone]

## 2015-11-27 NOTE — ED Notes (Signed)
MD at bedside. 

## 2015-11-27 NOTE — Discharge Instructions (Signed)
Hold aspirin for 3 days. Continue to take Coumadin as prescribed. Return to the ER for recurrent bleeding.

## 2015-11-27 NOTE — ED Triage Notes (Signed)
Pt c/o constant nose bleeds for four days. Pt is on coumadin. Pt states bleeding is going down throat. Wife at bedside states she has packed pt's nose multiple times, bleeding will stop then start up again shortly

## 2015-11-27 NOTE — ED Notes (Signed)
Paged VAD for Pollina -TY

## 2015-11-29 ENCOUNTER — Ambulatory Visit (HOSPITAL_COMMUNITY)
Admission: RE | Admit: 2015-11-29 | Discharge: 2015-11-29 | Disposition: A | Discharge status: Home / Self Care | Payer: Medicare Other | Source: Ambulatory Visit | Attending: Cardiology | Admitting: Cardiology

## 2015-11-29 ENCOUNTER — Ambulatory Visit (HOSPITAL_COMMUNITY): Payer: Self-pay | Admitting: Infectious Diseases

## 2015-11-29 ENCOUNTER — Encounter (HOSPITAL_COMMUNITY): Payer: Self-pay

## 2015-11-29 VITALS — BP 117/75 | HR 66 | Ht 74.0 in | Wt 182.8 lb

## 2015-11-29 DIAGNOSIS — Z7901 Long term (current) use of anticoagulants: Secondary | ICD-10-CM | POA: Insufficient documentation

## 2015-11-29 DIAGNOSIS — R040 Epistaxis: Secondary | ICD-10-CM

## 2015-11-29 DIAGNOSIS — I5022 Chronic systolic (congestive) heart failure: Secondary | ICD-10-CM | POA: Diagnosis not present

## 2015-11-29 DIAGNOSIS — I493 Ventricular premature depolarization: Secondary | ICD-10-CM | POA: Diagnosis not present

## 2015-11-29 DIAGNOSIS — Z95811 Presence of heart assist device: Secondary | ICD-10-CM | POA: Diagnosis not present

## 2015-11-29 DIAGNOSIS — R413 Other amnesia: Secondary | ICD-10-CM

## 2015-11-29 DIAGNOSIS — R229 Localized swelling, mass and lump, unspecified: Secondary | ICD-10-CM

## 2015-11-29 DIAGNOSIS — Z79899 Other long term (current) drug therapy: Secondary | ICD-10-CM | POA: Insufficient documentation

## 2015-11-29 DIAGNOSIS — I429 Cardiomyopathy, unspecified: Secondary | ICD-10-CM | POA: Diagnosis not present

## 2015-11-29 MED ORDER — FUROSEMIDE 20 MG PO TABS: 20.0000 mg | ORAL_TABLET | Freq: Every day | ORAL | 3 refills | Status: DC

## 2015-11-29 NOTE — Progress Notes (Addendum)
Symptom Yes No Details  Angina  x Activity:  Claudication  x How far:  Syncope  x When:  Stroke  x   Orthopnea  x How many pillows: 2  PND  x How often:    CPAP  n/a How many hrs:  Pedal edema x  1 - 2+  Abd fullness  x   N&V  x Fair appetite; eating 4 - 6 meals daily  Diaphoresis   When:  Bleeding x  Nosebleed x 4 days requiring packing this week  Urine  x Light yelllow  SOB  x Activity:   Palpitations  x When:  ICD shock  x   Hospitlizaitons  x When/where/why:  ED visit x  When/where/why: Epistaxis on 11/27/15 - nasal packing  Other MD  x When/who/why:  Activity  x Walking daily - starting cardiac rehab October 4th  Fluid   2L daily   Diet   Eats salt freely   Vital Signs: Doppler BP: 84 Automatic BP:  117/75 (78) HR: 66 SPO2: 98%  Weight: 182.8 lb Last weight: 177.2 lb Home Weights: 172 - 176 lbs  VAD interrogation & Equipment Management: Speed:  9000 Flow: 5.6 Power: 5.5w PI: 7.1 Alarms:  none Events:  none Fixed speed: 9000 Low speed limit: 8400  Primary Controller:  Replace back up battery in  27 months. Back up controller:   Replace back up battery in  27 months.   I reviewed the LVAD parameters from today and compared the results to the patient's prior recorded data.  LVAD interrogation was NEGATIVE for significant power changes, NEGATIVE for clinical alarms and STABLE for PI events/speed drops. No programming changes were made and pump is functioning within specified parameters.   LVAD equipment check completed and is in good working order. Back-up equipment present. LVAD education done on emergency procedures and precautions and reviewed exit site care. Provided patient with medium holster vest today per their request.   Exit Site Care: Drive line is being maintained daily by his wife every other day. Itching is improved and site is reportedly better after eliminating the CHG and prep. Site looks great today. No drainage aside from some brown crusts to  DL exit site. Still with only partial ingrowth. Denies any fevers, erythema, drainage, tenderness. New daily dressing applied in clinic today.  Advance to Q3days. No supplies needed today.   Encounter Details: Patient presents for 2 week follow up in [facility] Clinic today with [name]. Visited ED on 9/20 for recurrent epistaxis requiring nasal packing which is still in place. Really wants it removed >> reviewed ED DC note and recommended to keep in place 3-5 days. Dr. Gala RomneyBensimhon removed packing today in clinic - restarted bleeding shortly after and repacked in clinic with gauze/tape. Will call for ENT referral today per Dr. Gala RomneyBensimhon. Reports he has taken lasix on Sunday, Monday and Wednesday of this week (averages about 3x/w looking at previous encounters).   2+ edema to b/l LE's today. JVP 9 on exam per Dr. Gala RomneyBensimhon. Increase lasix / KCL to 20 mg daily and as needed. Reinforced fluid and salt restriction (he currently eats salt freely). May benefit from some Spironolactone at some point. Repeat BMET in 2 weeks when he returns.   Memory/cognition still unimproved - Neurology consult sent to determine if treatment will be beneficial. He is still unable to identify his equipment or perform basic functions. Wife or family is with him 24/7.   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 Referral to dermatology.   Labs done in ED on 11/27/15 INR 2.14. Anticoagulation management includes INR goal of 2.0 - 2.5 and 81 mg ASA. See anticoagulation flow sheet. STOP ASA today with recurrent nosebleeds per Dr. Gala Romney.   Hgb 10.2 >> stable from previous value. Platelets dropping over last 2 months - down to 93K.   LDH stable at 190 with established baseline of 185 - 229. Denies tea-colored urine.   MAP 78 - 84 with doppler reflecting true mean. Controlled on current regimen.   Patient Instructions: 1. STOP Aspirin   2. Take TWO 20 mg lasix pills and TWO potassium Saturday ONCE. THEN take ONE 20 mg Lasix every day with ONE potassium pill.  --can take an extra lasix if needed for weight gain > 3 lbs overnight or 5 lbs in 1 week or visible swelling in legs 3. Compression stockings back on for now to help with fluid - can get them (Dr. Jari Sportsman [facility] Stockings at [facility]) 3. We will call you with appointments today with Ear Nose and Throat (ENT) doctor and Neurology appointment.  4. Back to office for lab work to check kidney function and drive line site in 2 weeks.  5. Can do every 3 days for drive line dressing. Next due on Monday.   [name], RN VAD Coordinator   Office: [phone] 24/7 Emergency VAD Pager: [phone]

## 2015-11-29 NOTE — Patient Instructions (Signed)
1. STOP Aspirin   2. Take TWO 20 mg lasix pills and TWO potassium Saturday ONCE. THEN take ONE 20 mg Lasix every day with ONE potassium pill.  --can take an extra lasix if needed for weight gain > 3 lbs overnight or 5 lbs in 1 week or visible swelling in legs  3. Compression stockings back on for now to help with fluid - can get them (Dr. Jari Sportsman [facility] Grade Stock[facility] at [facility])  3. We will call you with appointments today with Ear Nose and Throat (ENT) doctor and Neurology appointment.   ENT Office Address: [address] [place], [place], [place] [address] Phone: [phone]   Neurology Office [address], [place], [place] [address], Phone: [phone]  4. Back to office for lab work to check kidney function and drive line site.   5. Can do every 3 days for drive line dressing. Next due on Monday.

## 2015-12-01 NOTE — Progress Notes (Signed)
PCP:   LVAD CLINIC NOTE  HPI: Mr. 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   Post-op course c/b delirium/sundowning, urinary retention and deconditioning. Was eventually transferred to CIR.   Follow up for Heart Failure/LVAD: Returns for routine f/u. On 9/20 seen in ER for severe epistaxis. Nose packed and started on abx. He says he cannot breathe with packing and wants it out. Otherwise doing ok. Walking with his wife and doing ADLs. Still struggling with his memory. Eating a lot of salt. Taking lasix intermittently. + edema. No orthopnea or PND.   Denies LVAD alarms.  Denies driveline trauma, erythema or drainage.  Denies ICD shocks.   Reports taking Coumadin as prescribed and adherence to anticoagulation based dietary restrictions.  Denies bright red blood per rectum or melena, no dark urine or hematuria.    VAD interrogation & Equipment Management: Speed:  9000 Flow: 5.6 Power: 5.5w PI: 7.1 Alarms:  none Events:  none Fixed speed: 9000 Low speed limit: 8400  Primary Controller:  Replace back up battery in  27 months. Back up controller:   Replace back up battery in  27 months.    I reviewed the LVAD parameters from todayand compared the results to the patient's prior recorded data.LVAD interrogation was NEGATIVEfor significant power changes, NEGATIVEfor clinicalalarms and NEGATIVEfor PI events/speed drops LVAD equipment check completed and is in good working order. Back-up equipment present. LVAD education done on emergency procedures and precautions and reviewed exit site care.    Past Medical History:  Diagnosis Date  . Automatic implantable cardioverter-defibrillator in situ   . GERD (gastroesophageal reflux disease)   . H/O hiatal hernia   . Insomnia   . Nephrolithiasis   . Nonischemic cardiomyopathy (HCC)    Cardiac catheterization 12/2011 with LVEF  45%, no significant obstructive CAD  . Osteoarthritis   . Pneumonia    Dec 2016    Current Outpatient Prescriptions  Medication Sig Dispense Refill  . acetaminophen (TYLENOL) 325 MG tablet Take 2 tablets (650 mg total) by mouth every 4 (four) hours as needed for mild pain or moderate pain.    . calcium carbonate (OSCAL) 1500 (600 Ca) MG TABS tablet Take by mouth daily.    Marland Kitchen. Cod Liver Oil 1000 MG CAPS Take by mouth daily.    . ferrous fumarate-b12-vitamic C-folic acid (TRINSICON / FOLTRIN) capsule Take 1 capsule by mouth 3 (three) times daily after meals. 90 capsule 0  . furosemide (LASIX) 20 MG tablet Take 1 tablet (20 mg total) by mouth daily. And as needed. 45 tablet 3  . Glucosamine Sulfate 1000 MG CAPS Take by mouth daily.    . Multiple Vitamin (MULTIVITAMIN) tablet Take 1 tablet by mouth daily.    . pantoprazole (PROTONIX) 40 MG tablet Take 1 tablet (40 mg total) by mouth daily. 30 tablet 0  . potassium chloride SA (K-DUR,KLOR-CON) 20 MEQ tablet Take 1 tablet (20 mEq total) by mouth daily. 90 tablet 3  . psyllium (REGULOID) 0.52 g capsule Take 0.52 g by mouth 2 (two) times daily after a meal.    . tamsulosin (FLOMAX) 0.4 MG CAPS capsule Take 1 capsule (0.4 mg total) by mouth daily. 30 capsule 0  . warfarin (COUMADIN) 3 MG tablet Take 1 tablet (3 mg total) by mouth daily. Take 2 pill daily or as directed by LVAD Clinic. 180 tablet 3  . warfarin (COUMADIN) 6  MG tablet Take 1 tablet (6 mg total) by mouth daily. 30 tablet 6  . amoxicillin (AMOXIL) 500 MG capsule Take 4 capsules (2,000 mg total) by mouth once as needed (Take 30-60 minutes before dental cleaning/procedure). (Patient not taking: Reported on 11/29/2015) 12 capsule 0   No current facility-administered medications for this encounter.     Phenergan [promethazine hcl]; Lasix [furosemide]; and Morphine and related  REVIEW OF SYSTEMS: All systems negative except as listed in HPI, PMH and Problem list.    Vitals:   11/29/15  1154 11/29/15 1155  BP: (!) 84/0 117/75  Pulse: 66   SpO2: 98%   Weight: 182 lb 12.8 oz (82.9 kg)   Height: 6\' 2"  (1.88 m)    Vital Signs: Doppler BP: 84 Automatic BP:  117/75 (78) HR: 66 SPO2: 98%  Weight: 182.8 lb Last weight: 177.2 lb Home Weights: 172 - 176 lbs   Physical Exam: GENERAL: Well appearing, male who presents to clinic today in no acute distress. Ambulated in the clinic without difficulty HEENT: normal x for L nare packing NECK: Supple, JVP ~10  2+ bilaterally, no bruits.  No lymphadenopathy or thyromegaly appreciated.  CARDIAC:  Mechanical heart sounds with LVAD hum present.  LUNGS:  Clear to auscultation bilaterally.  ABDOMEN:  Soft, round, nontender, positive bowel sounds x4.     LVAD exit site: well-healed and incorporated.   Stabilization device present and accurately applied.  Driveline dressing is being changed daily per sterile technique. EXTREMITIES:  Warm and dry, no cyanosis, clubbing, rash or R and LLE 1-2+ edema  NEUROLOGIC:  Alert and oriented.     ASSESSMENT AND PLAN:   1. 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 EF 15-20%. S/p HM-II LVAD 08/30/15 NYHA II.  - Volume is up in setting of dietary indiscretion. Increase lasix to daily with KCL.  - Continue current meds. MAP stable today.  - VAD parents stable. - ICD interrogated personally. In clinic no VT/VF, No AF. Activity level about 1hr/day - VAD parameters and vitals stable.  2. 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 Will need to follow TSH and obtain yearly eye exams.  3. Deconditioning- Continue HHPT HHOT 4.  Anticoagulation management - INR goal 2.0-3.0. INR 2.14 today. With epistaxis will stop ASA.  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 Will make neuro referral. Continue HH ST.  6. Driveline infection -Improved 7. Epistaxis -I removed packing in clinic and site looked stable. ASA stopped. Refer to ENT for evalaution and also assessment of nodule on left  nare.   Aadya Kindler,MD 10:08 PM

## 2015-12-02 ENCOUNTER — Other Ambulatory Visit (HOSPITAL_COMMUNITY): Payer: Self-pay | Admitting: Infectious Diseases

## 2015-12-02 ENCOUNTER — Encounter (HOSPITAL_COMMUNITY): Payer: Self-pay | Admitting: Infectious Diseases

## 2015-12-02 MED ORDER — WARFARIN SODIUM 3 MG PO TABS: 6.0000 mg | ORAL_TABLET | Freq: Every day | ORAL | 3 refills | Status: DC

## 2015-12-11 ENCOUNTER — Encounter (HOSPITAL_COMMUNITY)
Admission: RE | Admit: 2015-12-11 | Discharge: 2015-12-11 | Disposition: A | Discharge status: Home / Self Care | Payer: Medicare Other | Source: Ambulatory Visit | Attending: Internal Medicine | Admitting: Internal Medicine

## 2015-12-11 VITALS — BP 120/60 | HR 61 | Ht 75.0 in | Wt 187.5 lb

## 2015-12-11 DIAGNOSIS — K219 Gastro-esophageal reflux disease without esophagitis: Secondary | ICD-10-CM | POA: Diagnosis not present

## 2015-12-11 DIAGNOSIS — Z79899 Other long term (current) drug therapy: Secondary | ICD-10-CM | POA: Insufficient documentation

## 2015-12-11 DIAGNOSIS — Z7901 Long term (current) use of anticoagulants: Secondary | ICD-10-CM | POA: Insufficient documentation

## 2015-12-11 DIAGNOSIS — Z95811 Presence of heart assist device: Secondary | ICD-10-CM | POA: Insufficient documentation

## 2015-12-11 DIAGNOSIS — M1990 Unspecified osteoarthritis, unspecified site: Secondary | ICD-10-CM | POA: Insufficient documentation

## 2015-12-11 DIAGNOSIS — I5022 Chronic systolic (congestive) heart failure: Secondary | ICD-10-CM | POA: Diagnosis not present

## 2015-12-11 DIAGNOSIS — I428 Other cardiomyopathies: Secondary | ICD-10-CM | POA: Diagnosis not present

## 2015-12-11 NOTE — Progress Notes (Signed)
Cardiac/Pulmonary Rehab Medication Review by a Pharmacist  Does the patient  feel that his/her medications are working for him/her?  yes  Has the patient been experiencing any side effects to the medications prescribed?  no  Does the patient measure his/her own blood pressure or blood glucose at home?  As needed   Does the patient have any problems obtaining medications due to transportation or finances?   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 Fortunately, his wife understands  His regimen, indications and ensures he is compliant. Anticoag clinica is still adjusting coumadin dose. He is tolerating his medications without any problems. Continue to reinforce information about medications and indications.  Thanks for the opportunity to participate in the care of this patient,   [name], BS Loura Back, [place] Clinical Pharmacist Pager [phone] 12/11/2015 2:08 PM

## 2015-12-11 NOTE — Progress Notes (Signed)
Daily Session Note  Patient Details  Name: [name] MRN: [id number] Date of Birth: 26-Dec-1940 Referring Provider:    Encounter Date: 12/11/2015  Check In:     Session Check In - 12/11/15 1230      Check-In   Location [facility]   Staff Present Sallyanne Birkhead Angelina Pih, MS, EP, [facility], Exercise Physiologist;Gregory Luther Parody, BS, EP, Exercise Physiologist;Debra Wynetta Emery, RN, BSN   Supervising physician immediately available to respond to emergencies See telemetry face sheet for immediately available MD   Medication changes reported     No   Fall or balance concerns reported    No   Warm-up and Cool-down Performed as group-led instruction   Resistance Training Performed Yes   VAD Patient? Yes     VAD patient   Has back up controller? Yes   Has spare charged batteries? Yes   Has battery cables? Yes   Has compatible battery clips? Yes     Pain Assessment   Currently in Pain? No/denies   Multiple Pain Sites No      Capillary Blood Glucose: No results found for this or any previous visit (from the past 24 hour(s)).   Goals Met:  Independence with exercise equipment Exercise tolerated well No report of cardiac concerns or symptoms Strength training completed today  Goals Unmet:  Not Applicable  Comments: Check out: 1505, This session was a part of his medical review/orientation.    Dr. Kate Sable is Medical Director for [facility] Cardiac and Pulmonary Rehab.

## 2015-12-12 NOTE — Progress Notes (Signed)
Cardiac Individual Treatment Plan  Patient Details  Name: [name] MRN: [id number] Date of Birth: 07-19-1940 Referring Provider:   Flowsheet Row CARDIAC REHAB PHASE II ORIENTATION from 12/11/2015 in [facility] CARDIAC REHABILITATION  Referring Provider  Dr. Gala Romney      Initial Encounter Date:  Flowsheet Row CARDIAC REHAB PHASE II ORIENTATION from 12/11/2015 in [place] CARDIAC REHABILITATION  Date  12/11/15  Referring Provider  Dr. Gala Romney      Visit Diagnosis: LVAD (left ventricular assist device) present [facility])  CHF (congestive heart failure), NYHA class III, chronic, systolic (HCC)  Patient's Home Medications on Admission:  Current Outpatient Prescriptions:  .  acetaminophen (TYLENOL) 325 MG tablet, Take 2 tablets (650 mg total) by mouth every 4 (four) hours as needed for mild pain or moderate pain., Disp: , Rfl:  .  amoxicillin (AMOXIL) 500 MG capsule, Take 4 capsules (2,000 mg total) by mouth once as needed (Take 30-60 minutes before dental cleaning/procedure). (Patient not taking: Reported on 11/29/2015), Disp: 12 capsule, Rfl: 0 .  calcium carbonate (OSCAL) 1500 (600 Ca) MG TABS tablet, Take by mouth daily., Disp: , Rfl:  .  Cod Liver Oil 1000 MG CAPS, Take by mouth 3 (three) times a week. , Disp: , Rfl:  .  ferrous fumarate-b12-vitamic C-folic acid (TRINSICON / FOLTRIN) capsule, Take 1 capsule by mouth 3 (three) times daily after meals. (Patient taking differently: Take 1 capsule by mouth 2 (two) times daily with a meal. ), Disp: 90 capsule, Rfl: 0 .  furosemide (LASIX) 20 MG tablet, Take 1 tablet (20 mg total) by mouth daily. And as needed., Disp: 45 tablet, Rfl: 3 .  Glucosamine Sulfate 1000 MG CAPS, Take by mouth daily., Disp: , Rfl:  .  Multiple Vitamin (MULTIVITAMIN) tablet, Take 1 tablet by mouth daily., Disp: , Rfl:  .  pantoprazole (PROTONIX) 40 MG tablet, Take 1 tablet (40 mg total) by mouth daily., Disp: 30 tablet, Rfl: 0 .  potassium chloride SA  (K-DUR,KLOR-CON) 20 MEQ tablet, Take 1 tablet (20 mEq total) by mouth daily., Disp: 90 tablet, Rfl: 3 .  psyllium (REGULOID) 0.52 g capsule, Take 0.52 g by mouth 2 (two) times daily after a meal., Disp: , Rfl:  .  tamsulosin (FLOMAX) 0.4 MG CAPS capsule, Take 1 capsule (0.4 mg total) by mouth daily., Disp: 30 capsule, Rfl: 0 .  warfarin (COUMADIN) 3 MG tablet, Take 2 tablets (6 mg total) by mouth daily at 6 PM. Or as directed by LVAD Clinic., Disp: 180 tablet, Rfl: 3  Past Medical History: Past Medical History:  Diagnosis Date  . Automatic implantable cardioverter-defibrillator in situ   . GERD (gastroesophageal reflux disease)   . H/O hiatal hernia   . Insomnia   . Nephrolithiasis   . Nonischemic cardiomyopathy (HCC)    Cardiac catheterization 12/2011 with LVEF 45%, no significant obstructive CAD  . Osteoarthritis   . Pneumonia    Dec 2016    Tobacco Use: History  Smoking Status  . Never Smoker  Smokeless Tobacco  . Current User  . Types: Chew    Labs: Recent Review Flowsheet Data    Labs for ITP Cardiac and Pulmonary Rehab Latest Ref Rng & Units 09/16/2015 09/17/2015 09/18/2015 09/19/2015 11/11/2015   Cholestrol 0 - 200 mg/dL - - - - -   LDLCALC 0 - 99 mg/dL - - - - -   HDL >11 mg/dL - - - - -   Trlycerides <150 mg/dL - - - - -  Hemoglobin A1c 4.8 - 5.6 % - - - - -   PHART 7.350 - 7.450 - - - - -   PCO2ART 35.0 - 45.0 mmHg - - - - -   HCO3 20.0 - 24.0 mEq/L - - - - -   TCO2 0 - 100 mmol/L - - - - 25   ACIDBASEDEF 0.0 - 2.0 mmol/L - - - - -   O2SAT % 70.0 61.9 83.4 71.8 -      Capillary Blood Glucose: Lab Results  Component Value Date   GLUCAP 133 (H) 09/19/2015   GLUCAP 106 (H) 09/19/2015   GLUCAP 183 (H) 09/18/2015   GLUCAP 120 (H) 09/18/2015   GLUCAP 166 (H) 09/18/2015     Exercise Target Goals: Date: 12/11/15  Exercise Program Goal: Individual exercise prescription set with THRR, safety & activity barriers. Participant demonstrates ability to understand  and report RPE using BORG scale, to self-measure pulse accurately, and to acknowledge the importance of the exercise prescription.  Exercise Prescription Goal: Starting with aerobic activity 30 plus minutes a day, 3 days per week for initial exercise prescription. Provide home exercise prescription and guidelines that participant acknowledges understanding prior to discharge.  Activity Barriers & Risk Stratification:     Activity Barriers & Cardiac Risk Stratification - 12/11/15 1530      Activity Barriers & Cardiac Risk Stratification   Activity Barriers None   Cardiac Risk Stratification High      6 Minute Walk:     6 Minute Walk    Row Name 12/12/15 0830         6 Minute Walk   Phase Initial     Distance 1200 feet     Walk Time 6 minutes     # of Rest Breaks 0     MPH 2.27     METS 2.74     RPE 11     Perceived Dyspnea  10     VO2 Peak 8.92     Resting HR 61 bpm     Resting BP 100/80     Max Ex. HR 128 bpm     Max Ex. BP 120/80     2 Minute Post BP 100/80        Initial Exercise Prescription:     Initial Exercise Prescription - 12/12/15 0800      Date of Initial Exercise RX and Referring Provider   Date 12/11/15   Referring Provider Dr. Gala RomneyBensimhon     Treadmill   MPH 1.3   Grade 0   Minutes 15   METs 1.9     NuStep   Level 2   Watts 18   Minutes 15   METs 2.3     Arm Ergometer   Level 2   Watts 15   Minutes 20   METs 2.5     Prescription Details   Frequency (times per week) 3   Duration Progress to 30 minutes of continuous aerobic without signs/symptoms of physical distress     Intensity   THRR REST +  30   THRR 40-80% of Max Heartrate 95-113-130   Ratings of Perceived Exertion 11-13     Progression   Progression Continue to progress workloads to maintain intensity without signs/symptoms of physical distress.     Resistance Training   Training Prescription Yes   Weight 1   Reps 10-12      Perform Capillary Blood Glucose checks  as needed.  Exercise Prescription Changes:  Exercise Comments:    Discharge Exercise Prescription (Final Exercise Prescription Changes):   Nutrition:  Target Goals: Understanding of nutrition guidelines, daily intake of sodium 1500mg , cholesterol 200mg , calories 30% from fat and 7% or less from saturated fats, daily to have 5 or more servings of fruits and vegetables.  Biometrics:     Pre Biometrics - 12/12/15 0843      Pre Biometrics   Height 6\' 3"  (1.905 m)   Weight 187 lb 8 oz (85 kg)   Waist Circumference 38 inches   Hip Circumference 37 inches   Waist to Hip Ratio 1.03 %   BMI (Calculated) 23.5   Triceps Skinfold 9 mm   % Body Fat 22.6 %   Grip Strength 64.6 kg   Flexibility 0 in   Single Leg Stand 2 seconds       Nutrition Therapy Plan and Nutrition Goals:   Nutrition Discharge: Rate Your Plate Scores:     Nutrition Assessments - 12/11/15 1531      MEDFICTS Scores   Pre Score 82      Nutrition Goals Re-Evaluation:   Psychosocial: Target Goals: Acknowledge presence or absence of depression, maximize coping skills, provide positive support system. Participant is able to verbalize types and ability to use techniques and skills needed for reducing stress and depression.  Initial Review & Psychosocial Screening:     Initial Psych Review & Screening - 12/11/15 1532      Initial Review   Current issues with Current Sleep Concerns  Patient says he does feel down and frustrated at times d/t not being able to do the things he used to do like yard work but no depression.      Family Dynamics   Good Support System? Yes     Barriers   Psychosocial barriers to participate in program There are no identifiable barriers or psychosocial needs.     Screening Interventions   Interventions Encouraged to exercise      Quality of Life Scores:     Quality of Life - 12/12/15 0845      Quality of Life Scores   Health/Function Pre 29.5 %   Socioeconomic  Pre 28.44 %   Psych/Spiritual Pre 27.86 %   Family Pre 30 %   GLOBAL Pre 29 %      PHQ-9: Recent Review Flowsheet Data    Depression screen [facility] 2/9 12/11/2015   Decreased Interest 0   Down, Depressed, Hopeless 1   PHQ - 2 Score 1   Altered sleeping 1   Tired, decreased energy 1   Change in appetite 0   Feeling bad or failure about yourself  0   Trouble concentrating 0   Moving slowly or fidgety/restless 0   Suicidal thoughts 0   PHQ-9 Score 3   Difficult doing work/chores Not difficult at all      Psychosocial Evaluation and Intervention:     Psychosocial Evaluation - 12/11/15 1534      Psychosocial Evaluation & Interventions   Interventions Relaxation education;Encouraged to exercise with the program and follow exercise prescription;Stress management education   Continued Psychosocial Services Needed No      Psychosocial Re-Evaluation:   Vocational Rehabilitation: Provide vocational rehab assistance to qualifying candidates.   Vocational Rehab Evaluation & Intervention:     Vocational Rehab - 12/11/15 1531      Initial Vocational Rehab Evaluation & Intervention   Assessment shows need for Vocational Rehabilitation No      Education: Education Goals:  Education classes will be provided on a weekly basis, covering required topics. Participant will state understanding/return demonstration of topics presented.  Learning Barriers/Preferences:     Learning Barriers/Preferences - 12/11/15 1530      Learning Barriers/Preferences   Learning Barriers None   Learning Preferences Skilled Demonstration      Education Topics: Hypertension, Hypertension Reduction -Define heart disease and high blood pressure. Discus how high blood pressure affects the body and ways to reduce high blood pressure.   Exercise and Your Heart -Discuss why it is important to exercise, the FITT principles of exercise, normal and abnormal responses to exercise, and how to exercise  safely.   Angina -Discuss definition of angina, causes of angina, treatment of angina, and how to decrease risk of having angina.   Cardiac Medications -Review what the following cardiac medications are used for, how they affect the body, and side effects that may occur when taking the medications.  Medications include Aspirin, Beta blockers, calcium channel blockers, ACE Inhibitors, angiotensin receptor blockers, diuretics, digoxin, and antihyperlipidemics.   Congestive Heart Failure -Discuss the definition of CHF, how to live with CHF, the signs and symptoms of CHF, and how keep track of weight and sodium intake.   Heart Disease and Intimacy -Discus the effect sexual activity has on the heart, how changes occur during intimacy as we age, and safety during sexual activity.   Smoking Cessation / COPD -Discuss different methods to quit smoking, the health benefits of quitting smoking, and the definition of COPD.   Nutrition I: Fats -Discuss the types of cholesterol, what cholesterol does to the heart, and how cholesterol levels can be controlled.   Nutrition II: Labels -Discuss the different components of food labels and how to read food label   Heart Parts and Heart Disease -Discuss the anatomy of the heart, the pathway of blood circulation through the heart, and these are affected by heart disease.   Stress I: Signs and Symptoms -Discuss the causes of stress, how stress may lead to anxiety and depression, and ways to limit stress.   Stress II: Relaxation -Discuss different types of relaxation techniques to limit stress.   Warning Signs of Stroke / TIA -Discuss definition of a stroke, what the signs and symptoms are of a stroke, and how to identify when someone is having stroke.   Knowledge Questionnaire Score:     Knowledge Questionnaire Score - 12/11/15 1531      Knowledge Questionnaire Score   Pre Score 21/24      Core Components/Risk Factors/Patient Goals  at Admission:     Personal Goals and Risk Factors at Admission - 12/12/15 0746      Core Components/Risk Factors/Patient Goals on Admission    Weight Management Weight Maintenance   Increase Strength and Stamina Yes   Intervention Provide advice, education, support and counseling about physical activity/exercise needs.;Develop an individualized exercise prescription for aerobic and resistive training based on initial evaluation findings, risk stratification, comorbidities and participant's personal goals.   Expected Outcomes Achievement of increased cardiorespiratory fitness and enhanced flexibility, muscular endurance and strength shown through measurements of functional capacity and personal statement of participant.   Heart Failure Yes   Intervention Provide a combined exercise and nutrition program that is supplemented with education, support and counseling about heart failure. Directed toward relieving symptoms such as shortness of breath, decreased exercise tolerance, and extremity edema.   Personal Goal Other Yes   Personal Goal Be able to do things on his own. Have a  better attitude and get rid of fear of death.    Intervention Patient will attend program exercising 3 days/week and supplement with exercise at home 2 to 4 days/week.    Expected Outcomes Patient will complete the program and meet his above stated goals.       Core Components/Risk Factors/Patient Goals Review:      Goals and Risk Factor Review    Row Name 12/12/15 [phone]             Core Components/Risk Factors/Patient Goals Review   Personal Goals Review Weight Management/Obesity;Increase Strength and Stamina;Heart Failure;Other  Be able to do things on his own. Have a better attitude and get rid of fear of death.           Core Components/Risk Factors/Patient Goals at Discharge (Final Review):      Goals and Risk Factor Review - 12/12/15 0752      Core Components/Risk Factors/Patient Goals Review    Personal Goals Review Weight Management/Obesity;Increase Strength and Stamina;Heart Failure;Other  Be able to do things on his own. Have a better attitude and get rid of fear of death.       ITP Comments:   Comments: Patient arrived for 1st visit/orientation/education at 1230. Patient was referred to CR by Dr. Gala Romney due to LVAD placement (Z95.81). During orientation advised patient on arrival and appointment times what to wear, what to do before, during and after exercise. Reviewed attendance and class policy. Talked about inclement weather and class consultation policy. Pt is scheduled to return Cardiac Rehab on 12/16/15 at 0930. Pt was advised to come to class 15 minutes before class starts. Patient was also given instructions on meeting with the dietician and attending the Family Structure classes. Patient was also instructed to always bring is pack up bag with extra batteries. Pt is eager to get started. Patient was able to complete 6 minute walk test. Patient was measured for the equipment. Patient participated in stretching, resistive training and light hand held weights. Discussed equipment safety with patient. Took patient pre-anthropometric measurements. Patient finished visit at 1305.

## 2015-12-13 ENCOUNTER — Ambulatory Visit (HOSPITAL_COMMUNITY)
Admission: RE | Admit: 2015-12-13 | Discharge: 2015-12-13 | Disposition: A | Discharge status: Home / Self Care | Payer: Medicare Other | Source: Ambulatory Visit | Attending: Cardiology | Admitting: Cardiology

## 2015-12-13 ENCOUNTER — Ambulatory Visit (HOSPITAL_COMMUNITY): Payer: Self-pay | Admitting: Infectious Diseases

## 2015-12-13 ENCOUNTER — Other Ambulatory Visit (HOSPITAL_COMMUNITY): Payer: Self-pay | Admitting: Cardiology

## 2015-12-13 ENCOUNTER — Other Ambulatory Visit (HOSPITAL_COMMUNITY): Payer: Self-pay | Admitting: Infectious Diseases

## 2015-12-13 DIAGNOSIS — Z95811 Presence of heart assist device: Secondary | ICD-10-CM | POA: Insufficient documentation

## 2015-12-13 DIAGNOSIS — Z7901 Long term (current) use of anticoagulants: Secondary | ICD-10-CM | POA: Diagnosis not present

## 2015-12-13 DIAGNOSIS — I5022 Chronic systolic (congestive) heart failure: Secondary | ICD-10-CM

## 2015-12-13 LAB — CBC
HEMATOCRIT: 33.1 % — AB (ref 39.0–52.0)
Hemoglobin: 9.8 g/dL — ABNORMAL LOW (ref 13.0–17.0)
MCH: 25.5 pg — AB (ref 26.0–34.0)
MCHC: 29.6 g/dL — AB (ref 30.0–36.0)
MCV: 86.2 fL (ref 78.0–100.0)
Platelets: 89 10*3/uL — ABNORMAL LOW (ref 150–400)
RBC: 3.84 MIL/uL — ABNORMAL LOW (ref 4.22–5.81)
RDW: 15.9 % — AB (ref 11.5–15.5)
WBC: 3.5 10*3/uL — ABNORMAL LOW (ref 4.0–10.5)

## 2015-12-13 LAB — PROTIME-INR
INR: 1.68
Prothrombin Time: 20 seconds — ABNORMAL HIGH (ref 11.4–15.2)

## 2015-12-13 LAB — BASIC METABOLIC PANEL
ANION GAP: 7 (ref 5–15)
BUN: 12 mg/dL (ref 6–20)
CALCIUM: 9.1 mg/dL (ref 8.9–10.3)
CO2: 25 mmol/L (ref 22–32)
CREATININE: 0.85 mg/dL (ref 0.61–1.24)
Chloride: 110 mmol/L (ref 101–111)
GFR calc Af Amer: >60 mL/min (ref >60)
GFR calc non Af Amer: >60 mL/min (ref >60)
GLUCOSE: 89 mg/dL (ref 65–99)
Potassium: 4.2 mmol/L (ref 3.5–5.1)
Sodium: 142 mmol/L (ref 135–145)

## 2015-12-13 LAB — LACTATE DEHYDROGENASE: LDH: 212 U/L — ABNORMAL HIGH (ref 98–192)

## 2015-12-13 NOTE — Addendum Note (Signed)
Encounter addended by: Blanchard Kelch, RN on: 12/13/2015 12:21 PM<BR>    Actions taken: Pend clinical note

## 2015-12-13 NOTE — Addendum Note (Signed)
Encounter addended by: Blanchard Kelch, RN on: 12/13/2015  1:00 PM<BR>    Actions taken: Order Reconciliation Section accessed, Order Entry activity accessed, Home Medications modified, Medication taking status modified, Sign clinical note

## 2015-12-13 NOTE — Addendum Note (Signed)
Encounter addended by: Theresia Bough, CMA on: 12/13/2015  2:02 PM<BR>    Actions taken: Order Entry activity accessed, Diagnosis association updated

## 2015-12-13 NOTE — Patient Instructions (Addendum)
Take TWO lasix (40 mg) Friday, Saturday and Sunday with TWO potassium each of these days. Call Monday to VAD team to report morning weight and VAD numbers.   Office visit with [place] again in 2 weeks. Garage Code [id number].  We taught you weekly dressing changes today in clinic. Will teach you how to use shower bag.

## 2015-12-13 NOTE — Addendum Note (Signed)
Encounter addended by: Blanchard Kelch, RN on: 12/13/2015  1:52 PM<BR>    Actions taken: Chief Complaint modified, LOS modified, Charge Capture section accepted, Pend clinical note

## 2015-12-13 NOTE — Progress Notes (Signed)
Symptom Yes No Details  Angina  x Activity:  Claudication  x How far:  Syncope  x When:  Stroke  x   Orthopnea  x How many pillows: 2  PND  x How often:    CPAP  n/a How many hrs:  Pedal edema x  daily  Abd fullness  x   N&V  x Good appettie  Diaphoresis   When:  Bleeding  x No further episodes of nosebleeds.   Urine  x Light yelllow  SOB  x Activity:   Palpitations  x When:  ICD shock  x   Hospitlizaitons  x When/where/why:  ED visit x  When/where/why: Epistaxis on 11/27/15 - nasal packing  Other MD  x When/who/why:  Activity  x Walking daily - starting cardiac rehab October 4th  Fluid   2L daily   Diet   Eats salt freely   Vital Signs: Doppler BP: 120 Automatic BP:  122/81 (103) HR: 66 SPO2: 98%  Weight: 190 lb  Last weight: 182.8 lb Home Weights: 176 - 181 lbs  VAD interrogation & Equipment Management: Speed:  9000 Flow: 4.8 Power: 5.1 w PI: 7.8 - 8.0 Alarms:  none Events:  none Fixed speed: 9000 Low speed limit: 8400  Primary Controller:  Replace back up battery in  27 months. Back up controller:   Replace back up battery in  27 months.   I reviewed the LVAD parameters from today and compared the results to the patient's prior recorded data.  LVAD interrogation was NEGATIVE for significant power changes, NEGATIVE for clinical alarms and STABLE for PI events/speed drops. No programming changes were made and pump is functioning within specified parameters.   LVAD equipment check completed and is in good working order. Back-up equipment present. LVAD education done on emergency procedures and precautions and reviewed exit site care. Provided patient with medium holster vest today per their request.   Exit Site Care: Drive line is being maintained daily by his wife every other day. Itching is improved and site is reportedly better after eliminating the CHG and prep. Site looks great today. No drainage aside from some brown crusts to DL exit site. Still with only  partial ingrowth. Denies any fevers, erythema, drainage, tenderness. New daily dressing applied in clinic today.  Advance to weekly. Provided 4 weekly, 4 daily kits today.   Encounter Details: Patient presents for 2 week follow up in VAD Clinic today with [name]. No further nosebleeds since cauterization at ENT office. Walking ~131mile daily and starting CR at [facility]. Appetite good. No limitations with fatigue / SOB. Does good with incline reported per patient/wife. Sleeps flat at night with no orthopnea/PND.   PI's persistently >7 last several visits w/o any PI events. Upon interrogation appears his PI is always > 6.5. Reviewed last RAMP with Amy - was on 9200 in hospital but decreased speed in setting of dehydration (diarrhea and poor PO intake) while he was in CIR a few months ago. Increased speed back to 9200 / 8600 >> NO BACK UP PRESENT today. Advised to bring with him in 2 weeks.   Carney BernJean reports his personality is just not the same as before surgery. Understands purpose and function of equipment but he is very nervous about it. I explained that this is normal and hopefully overtime he will grow more comfortable with it as he is now "catching up" with what he missed out on since surgery regarding education of eqt.  2-3+ edema to b/l LE's today up to shins. Elevated last visit. Supposed to be taking lasix daily but was not - taking 3x / week as he is out of the house more with starting Cardiac Rehab M/W/F. Eating higher salt foods and out often. D/W Amy Clegg - take 40 mg lasix Friday, Saturday Sunday with 40 mEq KCl. Instructed to call VAD clinic Monday to report weights and how legs look. Also advised to wear compression stockings to assist with decreasing swelling. Will likely need daily lasix.    Labs: INR 1.7>> Anticoagulation management includes INR goal of 2.0 - 2.5 and no ASA. See anticoagulation flow sheet.   Hgb 9.8 >> stable from previous value. Continues to be  thrombocytopenic.   LDH 212 stable at  with established baseline of 185 - 229. Denies tea-colored urine.   MAP 103 with doppler reflecting modified systolic. He has stopped taking flomax recently and he is volume overloaded today. Will reassess in 2 weeks when he returns. Increasing his diuretics will hopefully drop BP back some.   Patient Instructions: Take TWO lasix (40 mg) Friday, Saturday and Sunday with TWO potassium each of these days. Call Monday to VAD team to report morning weight and VAD numbers.   Office visit with [place] again in 2 weeks. Garage Code 4000.  We taught you weekly dressing changes today in clinic. Will teach you how to use shower bag.   [name], RN VAD Coordinator   Office: [phone] 24/7 Emergency VAD Pager: [phone]

## 2015-12-16 ENCOUNTER — Encounter (HOSPITAL_COMMUNITY)
Admission: RE | Admit: 2015-12-16 | Discharge: 2015-12-16 | Disposition: A | Discharge status: Home / Self Care | Payer: Medicare Other | Source: Ambulatory Visit | Attending: Internal Medicine | Admitting: Internal Medicine

## 2015-12-16 ENCOUNTER — Telehealth (HOSPITAL_COMMUNITY): Payer: Self-pay | Admitting: Infectious Diseases

## 2015-12-16 DIAGNOSIS — Z95811 Presence of heart assist device: Secondary | ICD-10-CM

## 2015-12-16 NOTE — Telephone Encounter (Signed)
Called weight in today after increasing speed + consistent diuretic use over weekend. Weight down 6 lbs (176.8 lbs) today. Reports his LE edema is improved and she can see his ankles for the first time in several weeks. She plans on giving him another dose today. I informed her that he should be taking 20 mg QD w/ 20 mEq KCl for maintenance based on previous MD visit with the ability to given an extra dose if needed for weight gain.   Reinforced importance of weighing the same time, the same way every day regarding equipment.   [name], RN VAD Coordinator   Office: [phone] 24/7 Emergency VAD Pager: [phone]

## 2015-12-16 NOTE — Telephone Encounter (Signed)
Order faxed for standing PT/INR to [facility] on [address] in [place] to F# [phone]. They know to obtain blood level Friday after CR.

## 2015-12-16 NOTE — Progress Notes (Signed)
Daily Session Note  Patient Details  Name: [name] MRN: [id number] Date of Birth: 10-Jul-1940 Referring Provider:   Flowsheet Row CARDIAC REHAB PHASE II ORIENTATION from 12/11/2015 in [place]  Referring Provider  Dr. Haroldine Laws      Encounter Date: 12/16/2015  Check In:     Session Check In - 12/16/15 0930      Check-In   Location [facility]   Staff Present Russella Dar, MS, EP, [facility], Exercise Physiologist;Debra Wynetta Emery, RN, BSN   Supervising physician immediately available to respond to emergencies See telemetry face sheet for immediately available MD   Medication changes reported     No   Fall or balance concerns reported    No   Warm-up and Cool-down Performed as group-led instruction   Resistance Training Performed Yes   VAD Patient? Yes     VAD patient   Has back up controller? Yes   Has spare charged batteries? Yes   Has battery cables? Yes   Has compatible battery clips? Yes     Pain Assessment   Currently in Pain? No/denies   Pain Score 0-No pain   Multiple Pain Sites No      Capillary Blood Glucose: No results found for this or any previous visit (from the past 24 hour(s)).   Goals Met:  Independence with exercise equipment Exercise tolerated well No report of cardiac concerns or symptoms Strength training completed today  Goals Unmet:  Not Applicable  Comments: Check out 1030   Dr. Kate Sable is Medical Director for [facility] and Pulmonary Rehab.

## 2015-12-18 ENCOUNTER — Encounter (HOSPITAL_COMMUNITY)
Admission: RE | Admit: 2015-12-18 | Discharge: 2015-12-18 | Disposition: A | Discharge status: Home / Self Care | Payer: Medicare Other | Source: Ambulatory Visit | Attending: Internal Medicine | Admitting: Internal Medicine

## 2015-12-18 DIAGNOSIS — Z95811 Presence of heart assist device: Secondary | ICD-10-CM

## 2015-12-18 NOTE — Progress Notes (Signed)
Daily Session Note  Patient Details  Name: [name] MRN: [id number] Date of Birth: November 12, 1940 Referring Provider:   Flowsheet Row CARDIAC REHAB PHASE II ORIENTATION from 12/11/2015 in [place]  Referring Provider  Dr. Haroldine Laws      Encounter Date: 12/18/2015  Check In:     Session Check In - 12/18/15 0930      Check-In   Location [facility]   Staff Present Russella Dar, MS, EP, [facility], Exercise Physiologist;Debra Wynetta Emery, RN, BSN;Denaly Gatling, BS, EP, Exercise Physiologist   Supervising physician immediately available to respond to emergencies See telemetry face sheet for immediately available MD   Medication changes reported     No   Fall or balance concerns reported    No   Warm-up and Cool-down Performed as group-led instruction   Resistance Training Performed Yes   VAD Patient? Yes     VAD patient   Has back up controller? Yes   Has spare charged batteries? Yes   Has battery cables? Yes   Has compatible battery clips? Yes     Pain Assessment   Currently in Pain? No/denies   Pain Score 0-No pain   Multiple Pain Sites No      Capillary Blood Glucose: No results found for this or any previous visit (from the past 24 hour(s)).      Exercise Prescription Changes - 12/17/15 1000      Exercise Review   Progression No     Response to Exercise   Blood Pressure (Admit) 91/75  MAP 98   Blood Pressure (Exercise) 123/91  MAP 99   Blood Pressure (Exit) 115/82  MAP 94   Heart Rate (Admit) 69 bpm   Heart Rate (Exercise) 91 bpm   Heart Rate (Exit) 75 bpm   Rating of Perceived Exertion (Exercise) 11   Duration Progress to 30 minutes of continuous aerobic without signs/symptoms of physical distress   Intensity Rest + 30     Progression   Progression Continue progressive overload as per policy without signs/symptoms or physical distress.     Resistance Training   Training Prescription Yes   Weight 1   Reps 10-12     Treadmill   MPH 0   Grade 0   Minutes 0   METs 0     NuStep   Level 2   Watts 14   Minutes 15   METs 3.58     Arm Ergometer   Level 2   Watts 9   Minutes 20   METs 1.8     Home Exercise Plan   Plans to continue exercise at Home   Frequency Add 2 additional days to program exercise sessions.     Goals Met:  Independence with exercise equipment Exercise tolerated well No report of cardiac concerns or symptoms Strength training completed today  Goals Unmet:  Not Applicable  Comments: Check out 1030   Dr. Kate Sable is Medical Director for [facility] and Pulmonary Rehab.

## 2015-12-19 NOTE — Progress Notes (Signed)
Cardiac Individual Treatment Plan  Patient Details  Name: [name] MRN: [id number] Date of Birth: 09-22-1940 Referring Provider:   Flowsheet Row CARDIAC REHAB PHASE II ORIENTATION from 12/11/2015 in [place]  Referring Provider  Dr. Haroldine Laws      Initial Encounter Date:  Flowsheet Row CARDIAC REHAB PHASE II ORIENTATION from 12/11/2015 in [place]  Date  12/11/15  Referring Provider  Dr. Haroldine Laws      Visit Diagnosis: LVAD (left ventricular assist device) present [facility])  Patient's Home Medications on Admission:  Current Outpatient Prescriptions:  .  acetaminophen (TYLENOL) 325 MG tablet, Take 2 tablets (650 mg total) by mouth every 4 (four) hours as needed for mild pain or moderate pain., Disp: , Rfl:  .  amoxicillin (AMOXIL) 500 MG capsule, Take 4 capsules (2,000 mg total) by mouth once as needed (Take 30-60 minutes before dental cleaning/procedure). (Patient not taking: Reported on 12/13/2015), Disp: 12 capsule, Rfl: 0 .  calcium carbonate (OSCAL) 1500 (600 Ca) MG TABS tablet, Take by mouth daily., Disp: , Rfl:  .  Cod Liver Oil 1000 MG CAPS, Take by mouth 3 (three) times a week. , Disp: , Rfl:  .  ferrous [id number] C-folic acid (TRINSICON / FOLTRIN) capsule, Take 1 capsule by mouth 3 (three) times daily after meals. (Patient taking differently: Take 1 capsule by mouth 2 (two) times daily with a meal. ), Disp: 90 capsule, Rfl: 0 .  furosemide (LASIX) 20 MG tablet, Take 1 tablet (20 mg total) by mouth daily. And as needed., Disp: 45 tablet, Rfl: 3 .  Glucosamine Sulfate 1000 MG CAPS, Take by mouth daily., Disp: , Rfl:  .  Multiple Vitamin (MULTIVITAMIN) tablet, Take 1 tablet by mouth daily., Disp: , Rfl:  .  pantoprazole (PROTONIX) 40 MG tablet, Take 1 tablet (40 mg total) by mouth daily., Disp: 30 tablet, Rfl: 0 .  potassium chloride SA (K-DUR,KLOR-CON) 20 MEQ tablet, Take 1 tablet (20 mEq total) by mouth daily.,  Disp: 90 tablet, Rfl: 3 .  psyllium (REGULOID) 0.52 g capsule, Take 0.52 g by mouth 2 (two) times daily after a meal., Disp: , Rfl:  .  warfarin (COUMADIN) 3 MG tablet, Take 2 tablets (6 mg total) by mouth daily at 6 PM. Or as directed by LVAD Clinic., Disp: 180 tablet, Rfl: 3  Past Medical History: Past Medical History:  Diagnosis Date  . Automatic implantable cardioverter-defibrillator in situ   . GERD (gastroesophageal reflux disease)   . H/O hiatal hernia   . Insomnia   . Nephrolithiasis   . Nonischemic cardiomyopathy ([place])    Cardiac catheterization 12/2011 with LVEF 45%, no significant obstructive CAD  . Osteoarthritis   . Pneumonia    Dec 2016    Tobacco Use: History  Smoking Status  . Never Smoker  Smokeless Tobacco  . Current User  . Types: Chew    Labs: Recent Review Flowsheet Data    Labs for ITP Cardiac and Pulmonary Rehab Latest Ref Rng & Units 09/16/2015 09/17/2015 09/18/2015 09/19/2015 11/11/2015   Cholestrol 0 - 200 mg/dL - - - - -   LDLCALC 0 - 99 mg/dL - - - - -   HDL >40 mg/dL - - - - -   Trlycerides <150 mg/dL - - - - -   Hemoglobin A1c 4.8 - 5.6 % - - - - -   PHART 7.350 - 7.450 - - - - -   PCO2ART 35.0 - 45.0 mmHg - - - - -  HCO3 20.0 - 24.0 mEq/L - - - - -   TCO2 0 - 100 mmol/L - - - - 25   ACIDBASEDEF 0.0 - 2.0 mmol/L - - - - -   O2SAT % 70.0 61.9 83.4 71.8 -      Capillary Blood Glucose: Lab Results  Component Value Date   GLUCAP 133 (H) 09/19/2015   GLUCAP 106 (H) 09/19/2015   GLUCAP 183 (H) 09/18/2015   GLUCAP 120 (H) 09/18/2015   GLUCAP 166 (H) 09/18/2015     Exercise Target Goals:    Exercise Program Goal: Individual exercise prescription set with THRR, safety & activity barriers. Participant demonstrates ability to understand and report RPE using BORG scale, to self-measure pulse accurately, and to acknowledge the importance of the exercise prescription.  Exercise Prescription Goal: Starting with aerobic activity 30 plus minutes  a day, 3 days per week for initial exercise prescription. Provide home exercise prescription and guidelines that participant acknowledges understanding prior to discharge.  Activity Barriers & Risk Stratification:     Activity Barriers & Cardiac Risk Stratification - 12/11/15 1530      Activity Barriers & Cardiac Risk Stratification   Activity Barriers None   Cardiac Risk Stratification High      6 Minute Walk:     6 Minute Walk    Row Name 12/12/15 0830         6 Minute Walk   Phase Initial     Distance 1200 feet     Walk Time 6 minutes     # of Rest Breaks 0     MPH 2.27     METS 2.74     RPE 11     Perceived Dyspnea  10     VO2 Peak 8.92     Resting HR 61 bpm     Resting BP 100/80     Max Ex. HR 128 bpm     Max Ex. BP 120/80     2 Minute Post BP 100/80        Initial Exercise Prescription:     Initial Exercise Prescription - 12/12/15 0800      Date of Initial Exercise RX and Referring Provider   Date 12/11/15   Referring Provider Dr. Haroldine Laws     Treadmill   MPH 1.3   Grade 0   Minutes 15   METs 1.9     NuStep   Level 2   Watts 18   Minutes 15   METs 2.3     Arm Ergometer   Level 2   Watts 15   Minutes 20   METs 2.5     Prescription Details   Frequency (times per week) 3   Duration Progress to 30 minutes of continuous aerobic without signs/symptoms of physical distress     Intensity   THRR REST +  30   THRR 40-80% of Max Heartrate 95-113-130   Ratings of Perceived Exertion 11-13     Progression   Progression Continue to progress workloads to maintain intensity without signs/symptoms of physical distress.     Resistance Training   Training Prescription Yes   Weight 1   Reps 10-12      Perform Capillary Blood Glucose checks as needed.  Exercise Prescription Changes:     Exercise Prescription Changes    Row Name 12/17/15 1000             Exercise Review   Progression No  Response to Exercise   Blood Pressure  (Admit) 91/75  MAP 98       Blood Pressure (Exercise) 123/91  MAP 99       Blood Pressure (Exit) 115/82  MAP 94       Heart Rate (Admit) 69 bpm       Heart Rate (Exercise) 91 bpm       Heart Rate (Exit) 75 bpm       Rating of Perceived Exertion (Exercise) 11       Duration Progress to 30 minutes of continuous aerobic without signs/symptoms of physical distress       Intensity Rest + 30         Progression   Progression Continue progressive overload as per policy without signs/symptoms or physical distress.         Resistance Training   Training Prescription Yes       Weight 1       Reps 10-12         Treadmill   MPH 0       Grade 0       Minutes 0       METs 0         NuStep   Level 2       Watts 14       Minutes 15       METs 3.58         Arm Ergometer   Level 2       Watts 9       Minutes 20       METs 1.8         Home Exercise Plan   Plans to continue exercise at Home       Frequency Add 2 additional days to program exercise sessions.          Exercise Comments:     Exercise Comments    Row Name 12/17/15 1040           Exercise Comments Patient has just started rehab and has some cognitive issues. Was not put on treadmill due to this reason as well.            Discharge Exercise Prescription (Final Exercise Prescription Changes):     Exercise Prescription Changes - 12/17/15 1000      Exercise Review   Progression No     Response to Exercise   Blood Pressure (Admit) 91/75  MAP 98   Blood Pressure (Exercise) 123/91  MAP 99   Blood Pressure (Exit) 115/82  MAP 94   Heart Rate (Admit) 69 bpm   Heart Rate (Exercise) 91 bpm   Heart Rate (Exit) 75 bpm   Rating of Perceived Exertion (Exercise) 11   Duration Progress to 30 minutes of continuous aerobic without signs/symptoms of physical distress   Intensity Rest + 30     Progression   Progression Continue progressive overload as per policy without signs/symptoms or physical distress.      Resistance Training   Training Prescription Yes   Weight 1   Reps 10-12     Treadmill   MPH 0   Grade 0   Minutes 0   METs 0     NuStep   Level 2   Watts 14   Minutes 15   METs 3.58     Arm Ergometer   Level 2   Watts 9   Minutes 20   METs 1.8  Home Exercise Plan   Plans to continue exercise at Home   Frequency Add 2 additional days to program exercise sessions.      Nutrition:  Target Goals: Understanding of nutrition guidelines, daily intake of sodium '1500mg'$ , cholesterol '200mg'$ , calories 30% from fat and 7% or less from saturated fats, daily to have 5 or more servings of fruits and vegetables.  Biometrics:     Pre Biometrics - 12/12/15 0843      Pre Biometrics   Height '6\' 3"'$  (1.905 m)   Weight 187 lb 8 oz (85 kg)   Waist Circumference 38 inches   Hip Circumference 37 inches   Waist to Hip Ratio 1.03 %   BMI (Calculated) 23.5   Triceps Skinfold 9 mm   % Body Fat 22.6 %   Grip Strength 64.6 kg   Flexibility 0 in   Single Leg Stand 2 seconds       Nutrition Therapy Plan and Nutrition Goals:   Nutrition Discharge: Rate Your Plate Scores:     Nutrition Assessments - 12/11/15 1531      MEDFICTS Scores   Pre Score 82      Nutrition Goals Re-Evaluation:   Psychosocial: Target Goals: Acknowledge presence or absence of depression, maximize coping skills, provide positive support system. Participant is able to verbalize types and ability to use techniques and skills needed for reducing stress and depression.  Initial Review & Psychosocial Screening:     Initial Psych Review & Screening - 12/11/15 1532      Initial Review   Current issues with Current Sleep Concerns  Patient says he does feel down and frustrated at times d/t not being able to do the things he used to do like yard work but no depression.      Family Dynamics   Good Support System? Yes     Barriers   Psychosocial barriers to participate in program There are no identifiable  barriers or psychosocial needs.     Screening Interventions   Interventions Encouraged to exercise      Quality of Life Scores:     Quality of Life - 12/12/15 0845      Quality of Life Scores   Health/Function Pre 29.5 %   Socioeconomic Pre 28.44 %   Psych/Spiritual Pre 27.86 %   Family Pre 30 %   GLOBAL Pre 29 %      PHQ-9: Recent Review Flowsheet Data    Depression screen [facility] 2/9 12/11/2015   Decreased Interest 0   Down, Depressed, Hopeless 1   PHQ - 2 Score 1   Altered sleeping 1   Tired, decreased energy 1   Change in appetite 0   Feeling bad or failure about yourself  0   Trouble concentrating 0   Moving slowly or fidgety/restless 0   Suicidal thoughts 0   PHQ-9 Score 3   Difficult doing work/chores Not difficult at all      Psychosocial Evaluation and Intervention:     Psychosocial Evaluation - 12/11/15 1534      Psychosocial Evaluation & Interventions   Interventions Relaxation education;Encouraged to exercise with the program and follow exercise prescription;Stress management education   Continued Psychosocial Services Needed No      Psychosocial Re-Evaluation:   Vocational Rehabilitation: Provide vocational rehab assistance to qualifying candidates.   Vocational Rehab Evaluation & Intervention:     Vocational Rehab - 12/11/15 1531      Initial Vocational Rehab Evaluation & Intervention   Assessment shows  need for Vocational Rehabilitation No      Education: Education Goals: Education classes will be provided on a weekly basis, covering required topics. Participant will state understanding/return demonstration of topics presented.  Learning Barriers/Preferences:     Learning Barriers/Preferences - 12/11/15 1530      Learning Barriers/Preferences   Learning Barriers None   Learning Preferences Skilled Demonstration      Education Topics: Hypertension, Hypertension Reduction -Define heart disease and high blood pressure. Discus  how high blood pressure affects the body and ways to reduce high blood pressure.   Exercise and Your Heart -Discuss why it is important to exercise, the FITT principles of exercise, normal and abnormal responses to exercise, and how to exercise safely.   Angina -Discuss definition of angina, causes of angina, treatment of angina, and how to decrease risk of having angina.   Cardiac Medications -Review what the following cardiac medications are used for, how they affect the body, and side effects that may occur when taking the medications.  Medications include Aspirin, Beta blockers, calcium channel blockers, ACE Inhibitors, angiotensin receptor blockers, diuretics, digoxin, and antihyperlipidemics.   Congestive Heart Failure -Discuss the definition of CHF, how to live with CHF, the signs and symptoms of CHF, and how keep track of weight and sodium intake.   Heart Disease and Intimacy -Discus the effect sexual activity has on the heart, how changes occur during intimacy as we age, and safety during sexual activity.   Smoking Cessation / COPD -Discuss different methods to quit smoking, the health benefits of quitting smoking, and the definition of COPD.   Nutrition I: Fats -Discuss the types of cholesterol, what cholesterol does to the heart, and how cholesterol levels can be controlled.   Nutrition II: Labels -Discuss the different components of food labels and how to read food label   Heart Parts and Heart Disease -Discuss the anatomy of the heart, the pathway of blood circulation through the heart, and these are affected by heart disease.   Stress I: Signs and Symptoms -Discuss the causes of stress, how stress may lead to anxiety and depression, and ways to limit stress. Flowsheet Row CARDIAC REHAB PHASE II EXERCISE from 12/18/2015 in [place] CARDIAC REHABILITATION  Date  12/18/15  Educator  Diane coad  Instruction Review Code  2- meets goals/outcomes      Stress  II: Relaxation -Discuss different types of relaxation techniques to limit stress.   Warning Signs of Stroke / TIA -Discuss definition of a stroke, what the signs and symptoms are of a stroke, and how to identify when someone is having stroke.   Knowledge Questionnaire Score:     Knowledge Questionnaire Score - 12/11/15 1531      Knowledge Questionnaire Score   Pre Score 21/24      Core Components/Risk Factors/Patient Goals at Admission:     Personal Goals and Risk Factors at Admission - 12/12/15 0746      Core Components/Risk Factors/Patient Goals on Admission    Weight Management Weight Maintenance   Increase Strength and Stamina Yes   Intervention Provide advice, education, support and counseling about physical activity/exercise needs.;Develop an individualized exercise prescription for aerobic and resistive training based on initial evaluation findings, risk stratification, comorbidities and participant's personal goals.   Expected Outcomes Achievement of increased cardiorespiratory fitness and enhanced flexibility, muscular endurance and strength shown through measurements of functional capacity and personal statement of participant.   Heart Failure Yes   Intervention Provide a combined exercise and nutrition  program that is supplemented with education, support and counseling about heart failure. Directed toward relieving symptoms such as shortness of breath, decreased exercise tolerance, and extremity edema.   Personal Goal Other Yes   Personal Goal Be able to do things on his own. Have a better attitude and get rid of fear of death.    Intervention Patient will attend program exercising 3 days/week and supplement with exercise at home 2 to 4 days/week.    Expected Outcomes Patient will complete the program and meet his above stated goals.       Core Components/Risk Factors/Patient Goals Review:      Goals and Risk Factor Review    Row Name 12/12/15 [phone]              Core Components/Risk Factors/Patient Goals Review   Personal Goals Review Weight Management/Obesity;Increase Strength and Stamina;Heart Failure;Other  Be able to do things on his own. Have a better attitude and get rid of fear of death.           Core Components/Risk Factors/Patient Goals at Discharge (Final Review):      Goals and Risk Factor Review - 12/12/15 0752      Core Components/Risk Factors/Patient Goals Review   Personal Goals Review Weight Management/Obesity;Increase Strength and Stamina;Heart Failure;Other  Be able to do things on his own. Have a better attitude and get rid of fear of death.       ITP Comments:     ITP Comments    Row Name 12/12/15 1520 12/19/15 1308         ITP Comments Patient met with Registered Dietitian to discuss nutrition topics including: Heart healthty eating, heart health cooking and make smart choices when shopping; Portion control; weight management; and hydration. Patient attended a group session with the hospital chaplian called Family [facility] to discuss and share how his recent cardiac diagnosis has effected his life.  Patient new to program. He has had 3 sessions. Will continue to monitor for progress.          Comments: 30 day review:  Patient new to program. He has had 3 sessions. Will continue to monitor for progress.

## 2015-12-20 ENCOUNTER — Other Ambulatory Visit: Payer: Self-pay | Admitting: Internal Medicine

## 2015-12-20 ENCOUNTER — Encounter (HOSPITAL_COMMUNITY)
Admission: RE | Admit: 2015-12-20 | Discharge: 2015-12-20 | Disposition: A | Discharge status: Home / Self Care | Payer: Medicare Other | Source: Ambulatory Visit | Attending: Internal Medicine | Admitting: Internal Medicine

## 2015-12-20 DIAGNOSIS — Z95811 Presence of heart assist device: Secondary | ICD-10-CM

## 2015-12-20 NOTE — Progress Notes (Signed)
Daily Session Note  Patient Details  Name: [name] MRN: [id number] Date of Birth: [birth year] Referring Provider:   Flowsheet Row CARDIAC REHAB PHASE II ORIENTATION from 12/11/2015 in [place]  Referring Provider  Dr. Haroldine Laws      Encounter Date: 12/20/2015  Check In:     Session Check In - 12/20/15 0930      Check-In   Location [facility]   Staff Present Russella Dar, MS, EP, [facility], Exercise Physiologist;Debra Wynetta Emery, RN, BSN   Supervising physician immediately available to respond to emergencies See telemetry face sheet for immediately available MD   Medication changes reported     No   Fall or balance concerns reported    No   Warm-up and Cool-down Performed as group-led instruction   Resistance Training Performed Yes   VAD Patient? Yes     VAD patient   Has back up controller? Yes   Has spare charged batteries? Yes   Has battery cables? Yes   Has compatible battery clips? Yes     Pain Assessment   Currently in Pain? No/denies   Pain Score 0-No pain   Multiple Pain Sites No      Capillary Blood Glucose: No results found for this or any previous visit (from the past 24 hour(s)).   Goals Met:  Independence with exercise equipment Exercise tolerated well No report of cardiac concerns or symptoms Strength training completed today  Goals Unmet:  Not Applicable  Comments: Check out 1030.   Dr. Kate Sable is Medical Director for [facility] Cardiac and Pulmonary Rehab.

## 2015-12-21 LAB — PROTIME-INR
INR: 1.5 — ABNORMAL HIGH (ref 0.8–1.2)
Prothrombin Time: 15.1 s — ABNORMAL HIGH (ref 9.1–12.0)

## 2015-12-23 ENCOUNTER — Ambulatory Visit (HOSPITAL_COMMUNITY): Payer: Self-pay | Admitting: Infectious Diseases

## 2015-12-23 ENCOUNTER — Encounter (HOSPITAL_COMMUNITY)
Admission: RE | Admit: 2015-12-23 | Discharge: 2015-12-23 | Disposition: A | Discharge status: Home / Self Care | Payer: Medicare Other | Source: Ambulatory Visit | Attending: Internal Medicine | Admitting: Internal Medicine

## 2015-12-23 DIAGNOSIS — Z95811 Presence of heart assist device: Secondary | ICD-10-CM

## 2015-12-23 LAB — PROTIME-INR: INR: 1.5 — AB (ref <=1.1)

## 2015-12-23 NOTE — Progress Notes (Signed)
Daily Session Note  Patient Details  Name: VERNON MAISH MRN: [id number] Date of Birth: [birth year] Referring Provider:   Flowsheet Row CARDIAC REHAB PHASE II ORIENTATION from 12/11/2015 in [place]  Referring Provider  Dr. Haroldine Laws      Encounter Date: 12/23/2015  Check In:     Session Check In - 12/23/15 0930      Check-In   Location [facility]   Staff Present Russella Dar, MS, EP, [facility], Exercise Physiologist;Markeeta Scalf Wynetta Emery, RN, BSN   Supervising physician immediately available to respond to emergencies See telemetry face sheet for immediately available MD   Medication changes reported     No   Fall or balance concerns reported    No   Warm-up and Cool-down Performed as group-led instruction   Resistance Training Performed Yes   VAD Patient? Yes     VAD patient   Has back up controller? Yes   Has spare charged batteries? Yes   Has battery cables? Yes   Has compatible battery clips? Yes     Pain Assessment   Currently in Pain? No/denies   Pain Score 0-No pain   Multiple Pain Sites No      Capillary Blood Glucose: No results found for this or any previous visit (from the past 24 hour(s)).   Goals Met:  Independence with exercise equipment Exercise tolerated well No report of cardiac concerns or symptoms Strength training completed today  Goals Unmet:  Not Applicable  Comments: Check out 1030.   Dr. Kate Sable is Medical Director for [facility] Cardiac and Pulmonary Rehab.

## 2015-12-25 ENCOUNTER — Encounter (HOSPITAL_COMMUNITY)
Admission: RE | Admit: 2015-12-25 | Discharge: 2015-12-25 | Disposition: A | Discharge status: Home / Self Care | Payer: Medicare Other | Source: Ambulatory Visit | Attending: Internal Medicine | Admitting: Internal Medicine

## 2015-12-25 DIAGNOSIS — Z95811 Presence of heart assist device: Secondary | ICD-10-CM

## 2015-12-25 NOTE — Progress Notes (Signed)
Daily Session Note  Patient Details  Name: [name] MRN: [id number] Date of Birth: April 11, 1940 Referring Provider:   Flowsheet Row CARDIAC REHAB PHASE II ORIENTATION from 12/11/2015 in [place]  Referring Provider  Dr. Haroldine Laws      Encounter Date: 12/25/2015  Check In:     Session Check In - 12/25/15 0930      Check-In   Location [facility]   Staff Present Russella Dar, MS, EP, [facility], Exercise Physiologist;Gregory Luther Parody, BS, EP, Exercise Physiologist;Kourtney Terriquez Wynetta Emery, RN, BSN   Supervising physician immediately available to respond to emergencies See telemetry face sheet for immediately available MD   Medication changes reported     No   Fall or balance concerns reported    No   Warm-up and Cool-down Performed as group-led instruction   Resistance Training Performed Yes   VAD Patient? Yes     VAD patient   Has back up controller? Yes   Has spare charged batteries? Yes   Has battery cables? Yes   Has compatible battery clips? Yes     Pain Assessment   Currently in Pain? No/denies   Pain Score 0-No pain   Multiple Pain Sites No      Capillary Blood Glucose: No results found for this or any previous visit (from the past 24 hour(s)).   Goals Met:  Independence with exercise equipment Exercise tolerated well No report of cardiac concerns or symptoms Strength training completed today  Goals Unmet:  Not Applicable  Comments: Check out 1030.   Dr. Kate Sable is Medical Director for [facility] Cardiac and Pulmonary Rehab.

## 2015-12-26 ENCOUNTER — Ambulatory Visit (HOSPITAL_COMMUNITY)
Admission: RE | Admit: 2015-12-26 | Discharge: 2015-12-26 | Disposition: A | Discharge status: Home / Self Care | Payer: Medicare Other | Source: Ambulatory Visit | Attending: Cardiology | Admitting: Cardiology

## 2015-12-26 DIAGNOSIS — I429 Cardiomyopathy, unspecified: Secondary | ICD-10-CM | POA: Diagnosis not present

## 2015-12-26 DIAGNOSIS — I493 Ventricular premature depolarization: Secondary | ICD-10-CM | POA: Diagnosis not present

## 2015-12-26 DIAGNOSIS — Z7901 Long term (current) use of anticoagulants: Secondary | ICD-10-CM | POA: Diagnosis not present

## 2015-12-26 DIAGNOSIS — I5022 Chronic systolic (congestive) heart failure: Secondary | ICD-10-CM | POA: Insufficient documentation

## 2015-12-26 DIAGNOSIS — K219 Gastro-esophageal reflux disease without esophagitis: Secondary | ICD-10-CM | POA: Diagnosis not present

## 2015-12-26 DIAGNOSIS — Z95811 Presence of heart assist device: Secondary | ICD-10-CM | POA: Insufficient documentation

## 2015-12-26 DIAGNOSIS — Z79899 Other long term (current) drug therapy: Secondary | ICD-10-CM | POA: Diagnosis not present

## 2015-12-26 DIAGNOSIS — M1990 Unspecified osteoarthritis, unspecified site: Secondary | ICD-10-CM | POA: Diagnosis not present

## 2015-12-26 LAB — BASIC METABOLIC PANEL
ANION GAP: 9 (ref 5–15)
BUN: 14 mg/dL (ref 6–20)
CHLORIDE: 106 mmol/L (ref 101–111)
CO2: 24 mmol/L (ref 22–32)
CREATININE: 1 mg/dL (ref 0.61–1.24)
Calcium: 9.4 mg/dL (ref 8.9–10.3)
GFR calc non Af Amer: >60 mL/min (ref >60)
Glucose, Bld: 108 mg/dL — ABNORMAL HIGH (ref 65–99)
Potassium: 4.2 mmol/L (ref 3.5–5.1)
Sodium: 139 mmol/L (ref 135–145)

## 2015-12-26 LAB — PROTIME-INR
INR: 1.74
PROTHROMBIN TIME: 20.6 s — AB (ref 11.4–15.2)

## 2015-12-26 LAB — CBC
HCT: 35.2 % — ABNORMAL LOW (ref 39.0–52.0)
HEMOGLOBIN: 10.9 g/dL — AB (ref 13.0–17.0)
MCH: 25.5 pg — ABNORMAL LOW (ref 26.0–34.0)
MCHC: 31 g/dL (ref 30.0–36.0)
MCV: 82.4 fL (ref 78.0–100.0)
Platelets: 114 10*3/uL — ABNORMAL LOW (ref 150–400)
RBC: 4.27 MIL/uL (ref 4.22–5.81)
RDW: 16 % — ABNORMAL HIGH (ref 11.5–15.5)
WBC: 4.8 10*3/uL (ref 4.0–10.5)

## 2015-12-26 LAB — LACTATE DEHYDROGENASE: LDH: 242 U/L — ABNORMAL HIGH (ref 98–192)

## 2015-12-26 NOTE — Patient Instructions (Addendum)
INCREASE your Lasix to TWO pills (40 mg) every day. Keep you potassium the same at ONE pill every day for now  Use the compression stockings during the day and remove at night.   Come back to see [place] in 3 weeks in VAD clinic.   Here are some tips for washing:  . Avoid getting the driveline exit site dressing wet, and consider planning bathing times around exit site dressing changes. . Use only the shower bag we gave you to shower with and don't get creative with your equipment to shower. Water and electricity do not mix and will cause your pump to stop.   . Sit on a chair or bathing stool in the bathtub or shower stall, and use a basin of warm water and washcloth or sponge to wash. Or, wash at the sink while standing on a towel, so as not to get the floor or bath rug wet. . To wash your hair, try using a hand-held shower wand or sprayer while standing over the kitchen sink or bathtub. . Be sure that all floor surfaces are dry when walking around after bathing, to avoid slipping. . Do not use powder around the exit site dressing. . Anytime your dressing appears wet CHANGE IT!

## 2015-12-26 NOTE — Progress Notes (Addendum)
Symptom Yes No Details  Angina  x Activity:  Claudication  x How far:  Syncope  x When:  Stroke  x   Orthopnea  x How many pillows: 2  PND  x How often:    CPAP  n/a How many hrs:  Pedal edema x  daily  Abd fullness  x   N&V  x Good appettie  Diaphoresis   When:  Bleeding  x No further episodes of nosebleeds.   Urine  x Light yelllow  SOB  x Activity:   Palpitations  x When:  ICD shock  x   Hospitlizaitons  x When/where/why:  ED visit x  When/where/why: Epistaxis on 11/27/15 - nasal packing  Other MD  x When/who/why:  Activity  x Walking daily - starting cardiac rehab October 4th  Fluid   2L daily   Diet   Eats salt freely   Vital Signs: Doppler Pressure: 92 Automatic BP: 96/66 (78) HR: 76 SPO2: 96%  Weight: 187.6 lb  Last weight: 190 lb Home Weights: 176 - 181 lbs  VAD interrogation & Equipment Management: Speed:  9200 Flow: 4.7 Power: 5.1 w PI: 7.0 Alarms:  none Events:  none Fixed speed: 9200 Low speed limit: 8600  Primary Controller:  Replace back up battery in  27 months. Back up controller:   Replace back up battery in  25 months. (adjusted speed settings to the above)  I reviewed the LVAD parameters from today and compared the results to the patient's prior recorded data.  LVAD interrogation was NEGATIVE for significant power changes, NEGATIVE for clinical alarms and STABLE for PI events/speed drops. No programming changes were made and pump is functioning within specified parameters.   LVAD equipment check completed and is in good working order. Back-up equipment present. LVAD education done on emergency procedures and precautions and reviewed exit site care. Provided patient with medium holster vest today per their request.  Exit Site Care: Drive line is being maintained weekly. Site looks great today. No drainage aside from some brown crusts to DL exit site. More progressive ingrowth. Denies any fevers, erythema, drainage, tenderness. New daily dressing  applied in clinic today - cleanse with only sterile saline, NO CHG or skin preps. Provided 8 weekly kits.   Encounter Details: Patient presents for 2 week follow up in VAD Clinic today with his wife [name]. Has been taking 20 mg lasix almost every day with 40 mg once a week. Still reports his ankles are very swollen. He is complaining of fatigue with exertion. He has started going to cardiac rehab 3x a week 3 weeks ago. Stopped walking with his family due to cold weather and encouraged to resume this on the 'off days' from CR. Also discussed the maintenance plan with CR and to inquire as to cost with next visit.   He has a lot of anxiety over the equipment and managing it. Afraid to be alone. Per his wife "panics at any beep." Reviewed equipment today again and practiced emergency planning for VAD alarms. 40 minutes spent on re-education of alarms and emergency management, equipment basics (for [name]).   He has been given permission to shower at this visit today - discussed shower bag and tips/tricks.   INTERMACs:  Patient completed 1040 feet during 6 minute walk test. Tolerated very well without breaks.   Neurocognitive trail making unable to be completed by patient due to lack of understanding of instructions.   [place] Cardiomyopathy, Post-VAD QOL completed with assistance by  myself - unable to answer questions on his own.   Labs: INR 1.74 >> Anticoagulation management includes INR goal of 2.0 - 2.5 and no ASA. See anticoagulation flow sheet. Has missed a few days. No Lovenox d/t recent severe nose bleeds requiring packing and cautery.   Hgb 10.9 >> stable from previous value.   LDH 212 stable at  with established baseline of 185 - 229. Denies tea-colored urine.   MAP 78 - doppler pressure reflecting modified systolic. Controlled on current regimen.   Increase lasix to 40 mg QD per Dr. Shirlee Latch. KCl replacement to remain at 20 mEq/d pending lab work. Return to clinic in 3 weeks. Instructed  to use compression stockings during the day and remove at night.   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   Post-op course c/b delirium/sundowning, urinary retention and deconditioning. Was eventually transferred to CIR.   Follow up for Heart Failure/LVAD: Returns for routine f/u. He is now off ASA due to epistaxis.  MAP is stable.  Speed increased to 9200 at last appointment, no PI events recently.  Weight is down 3 lbs.  Complains of fatigue.  No dyspnea walking on flat ground.  Going to cardiac rehab.    Denies LVAD alarms.  Denies driveline trauma, erythema or drainage.  Denies ICD shocks.   Reports taking Coumadin as prescribed and adherence to anticoagulation based dietary restrictions.  Denies bright red blood per rectum or melena, no dark urine or hematuria.    VAD interrogation & Equipment Management: See LVAD nurse's note above.   Past Medical History:  Diagnosis Date  . Automatic implantable cardioverter-defibrillator in situ   . GERD (gastroesophageal reflux disease)   . H/O hiatal hernia   . Insomnia   . Nephrolithiasis   . Nonischemic cardiomyopathy (HCC)    Cardiac catheterization 12/2011 with LVEF 45%, no significant obstructive CAD  . Osteoarthritis   . Pneumonia    Dec 2016    Current Outpatient Prescriptions  Medication Sig Dispense Refill  . acetaminophen (TYLENOL) 325 MG tablet Take 2 tablets (650 mg total) by mouth every 4 (four) hours as needed for mild pain or moderate pain.    . calcium carbonate (OSCAL) 1500 (600 Ca) MG TABS tablet Take by mouth daily.    Marland Kitchen Cod Liver Oil 1000 MG CAPS Take by mouth 3 (three) times a week.     . ferrous fumarate-b12-vitamic C-folic acid (TRINSICON / FOLTRIN) capsule Take 1 capsule by mouth 3  (three) times daily after meals. (Patient taking differently: Take 1 capsule by mouth 2 (two) times daily with a meal. ) 90 capsule 0  . furosemide (LASIX) 20 MG tablet Take 1 tablet (20 mg total) by mouth daily. And as needed. 45 tablet 3  . Glucosamine Sulfate 1000 MG CAPS Take by mouth daily.    . Multiple Vitamin (MULTIVITAMIN) tablet Take 1 tablet by mouth daily.    . pantoprazole (PROTONIX) 40 MG tablet Take 1 tablet (40 mg total) by mouth daily. 30 tablet 0  . potassium chloride SA (K-DUR,KLOR-CON) 20 MEQ tablet Take 1 tablet (20 mEq total) by mouth daily. 90 tablet 3  . psyllium (REGULOID) 0.52 g capsule Take 0.52 g by mouth 2 (two) times daily after a meal.    .  amoxicillin (AMOXIL) 500 MG capsule Take 4 capsules (2,000 mg total) by mouth once as needed (Take 30-60 minutes before dental cleaning/procedure). (Patient not taking: Reported on 12/26/2015) 12 capsule 0  . warfarin (COUMADIN) 3 MG tablet Take 2 tablets (6 mg total) by mouth daily at 6 PM. Or as directed by LVAD Clinic. 180 tablet 3   No current facility-administered medications for this encounter.     Phenergan [promethazine hcl]; Lasix [furosemide]; and Morphine and related  REVIEW OF SYSTEMS: All systems negative except as listed in HPI, PMH and Problem list.    There were no vitals filed for this visit. Vital Signs: MAP 83, HR 76  Physical Exam: GENERAL: Well appearing, male who presents to clinic today in no acute distress. Ambulated in the clinic without difficulty HEENT: normal x for L nare packing NECK: Supple, JVP 10-11,  2+ bilaterally, no bruits.  No lymphadenopathy or thyromegaly appreciated.  CARDIAC:  Mechanical heart sounds with LVAD hum present.  LUNGS:  Clear to auscultation bilaterally.  ABDOMEN:  Soft, round, nontender, positive bowel sounds x4.     LVAD exit site: well-healed and incorporated.   Stabilization device present and accurately applied.  Driveline dressing is being changed daily per  sterile technique. EXTREMITIES:  Warm and dry, no cyanosis, clubbing, rash.  2+ edema up lower legs bilaterally.   NEUROLOGIC:  Alert and oriented.     ASSESSMENT AND PLAN:   1. 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 EF 15-20%. S/p HM-II LVAD 08/30/15 NYHA II.  MAP stable.  VAD parameters stable.  He is volume overloaded.  Does not seem to follow a low sodium diet. - Increase Lasix to 40 mg daily. - Wear compression stockings during the day. - Continue cardiac rehab.  2. Frequent PVC: Off amiodarone, will follow.  3. Anticoagulation management: INR goal 2.0-3.0. Off ASA with epistaxis.   Zuleyma Scharf,MD 12/28/2015

## 2015-12-27 ENCOUNTER — Encounter (HOSPITAL_COMMUNITY)
Admission: RE | Admit: 2015-12-27 | Discharge: 2015-12-27 | Disposition: A | Discharge status: Home / Self Care | Payer: Medicare Other | Source: Ambulatory Visit | Attending: Internal Medicine | Admitting: Internal Medicine

## 2015-12-27 ENCOUNTER — Ambulatory Visit (HOSPITAL_COMMUNITY): Payer: Self-pay | Admitting: Infectious Diseases

## 2015-12-27 ENCOUNTER — Encounter (HOSPITAL_COMMUNITY): Payer: Medicare Other

## 2015-12-27 DIAGNOSIS — Z95811 Presence of heart assist device: Secondary | ICD-10-CM

## 2015-12-27 DIAGNOSIS — I5022 Chronic systolic (congestive) heart failure: Secondary | ICD-10-CM

## 2015-12-27 NOTE — Progress Notes (Signed)
Daily Session Note  Patient Details  Name: [name] MRN: [id number] Date of Birth: [birth year] Referring Provider:   Flowsheet Row CARDIAC REHAB PHASE II ORIENTATION from 12/11/2015 in [place]  Referring Provider  Dr. Haroldine Laws      Encounter Date: 12/27/2015  Check In:     Session Check In - 12/27/15 0930      Check-In   Location [facility]   Staff Present Russella Dar, MS, EP, [facility], Exercise Physiologist;Gregory Luther Parody, BS, EP, Exercise Physiologist   Supervising physician immediately available to respond to emergencies See telemetry face sheet for immediately available MD   Medication changes reported     No   Fall or balance concerns reported    No   Warm-up and Cool-down Performed as group-led instruction   Resistance Training Performed Yes   VAD Patient? Yes     VAD patient   Has back up controller? Yes   Has spare charged batteries? Yes   Has compatible battery clips? Yes     Pain Assessment   Multiple Pain Sites No      Capillary Blood Glucose: No results found for this or any previous visit (from the past 24 hour(s)).   Goals Met:  Independence with exercise equipment Exercise tolerated well No report of cardiac concerns or symptoms Strength training completed today  Goals Unmet:  Not Applicable  Comments: Check out: 1030   Dr. Kate Sable is Medical Director for [place] and Pulmonary Rehab.

## 2015-12-30 ENCOUNTER — Encounter (HOSPITAL_COMMUNITY)
Admission: RE | Admit: 2015-12-30 | Discharge: 2015-12-30 | Disposition: A | Discharge status: Home / Self Care | Payer: Medicare Other | Source: Ambulatory Visit | Attending: Internal Medicine | Admitting: Internal Medicine

## 2015-12-30 DIAGNOSIS — Z95811 Presence of heart assist device: Secondary | ICD-10-CM

## 2015-12-30 NOTE — Progress Notes (Signed)
Daily Session Note  Patient Details  Name: [name] MRN: [id number] Date of Birth: 12/20/1940 Referring Provider:   Flowsheet Row CARDIAC REHAB PHASE II ORIENTATION from 12/11/2015 in [place]  Referring Provider  Dr. Haroldine Laws      Encounter Date: 12/30/2015  Check In:     Session Check In - 12/30/15 0930      Check-In   Location [facility]   Staff Present Russella Dar, MS, EP, [facility], Exercise Physiologist;Julez Huseby Wynetta Emery, RN, BSN   Supervising physician immediately available to respond to emergencies See telemetry face sheet for immediately available MD   Medication changes reported     No   Fall or balance concerns reported    No   Warm-up and Cool-down Performed as group-led instruction   Resistance Training Performed Yes   VAD Patient? Yes     VAD patient   Has back up controller? Yes   Has spare charged batteries? Yes   Has battery cables? Yes   Has compatible battery clips? Yes     Pain Assessment   Currently in Pain? No/denies   Pain Score 0-No pain   Multiple Pain Sites No      Capillary Blood Glucose: No results found for this or any previous visit (from the past 24 hour(s)).   Goals Met:  Independence with exercise equipment Exercise tolerated well No report of cardiac concerns or symptoms Strength training completed today  Goals Unmet:  Not Applicable  Comments: Check out 1030.   Dr. Kate Sable is Medical Director for [facility] Cardiac and Pulmonary Rehab.

## 2015-12-31 ENCOUNTER — Encounter (INDEPENDENT_AMBULATORY_CARE_PROVIDER_SITE_OTHER): Payer: Self-pay | Admitting: Orthopaedic Surgery

## 2015-12-31 ENCOUNTER — Ambulatory Visit (INDEPENDENT_AMBULATORY_CARE_PROVIDER_SITE_OTHER): Payer: Medicare Other | Admitting: Orthopedic Surgery

## 2015-12-31 DIAGNOSIS — M170 Bilateral primary osteoarthritis of knee: Secondary | ICD-10-CM | POA: Diagnosis not present

## 2015-12-31 MED ORDER — LIDOCAINE HCL 1 % IJ SOLN
1.0000 mL | INTRAMUSCULAR | Status: AC | PRN
  Administered 2015-12-31: 1 mL

## 2015-12-31 MED ORDER — METHYLPREDNISOLONE ACETATE 40 MG/ML IJ SUSP
40.0000 mg | INTRAMUSCULAR | Status: AC | PRN
  Administered 2015-12-31: 40 mg via INTRA_ARTICULAR

## 2015-12-31 NOTE — Progress Notes (Signed)
   Procedure Note  Patient: [name]             Date of Birth: 1940-06-09           MRN: [id number]             Visit Date: 12/31/2015  Procedures: Visit Diagnoses: No diagnosis found.  Large Joint Inj Date/Time: 12/31/2015 10:58 AM Performed by: Kirtland Bouchard Authorized by: Kirtland Bouchard   Consent Given by:  Patient Indications:  Pain Location:  Knee Site:  L knee Needle Size:  22 G Approach:  Anterolateral Ultrasound Guidance: No   Fluoroscopic Guidance: No   Medications:  1 mL lidocaine 1 %; 40 mg methylPREDNISolone acetate 40 MG/ML Aspiration Attempted: No   Patient tolerance:  Patient tolerated the procedure well with no immediate complications Large Joint Inj Date/Time: 12/31/2015 10:59 AM Performed by: Kirtland Bouchard Authorized by: Kirtland Bouchard   Consent Given by:  Patient Indications:  Pain Location:  Knee Site:  R knee Needle Size:  22 G Approach:  Anterolateral Ultrasound Guidance: No   Fluoroscopic Guidance: No   Medications:  1 mL lidocaine 1 %; 40 mg methylPREDNISolone acetate 40 MG/ML Aspiration Attempted: No   Patient tolerance:  Patient tolerated the procedure well with no immediate complications  Subjective : Bilateral knee moderate arthritis has done well with knee pain until recently. Wants injections in both knees. No new injury.

## 2015-12-31 NOTE — Patient Instructions (Signed)
Quad exercises every other day. Foot straight up and turned in 3 sets of 12 reps each leg.

## 2016-01-01 ENCOUNTER — Other Ambulatory Visit: Payer: Self-pay | Admitting: Internal Medicine

## 2016-01-01 ENCOUNTER — Ambulatory Visit (HOSPITAL_COMMUNITY): Payer: Self-pay | Admitting: Infectious Diseases

## 2016-01-01 ENCOUNTER — Encounter (HOSPITAL_COMMUNITY)
Admission: RE | Admit: 2016-01-01 | Discharge: 2016-01-01 | Disposition: A | Discharge status: Home / Self Care | Payer: Medicare Other | Source: Ambulatory Visit | Attending: Internal Medicine | Admitting: Internal Medicine

## 2016-01-01 DIAGNOSIS — Z95811 Presence of heart assist device: Secondary | ICD-10-CM

## 2016-01-01 LAB — PROTIME-INR: INR: 1.9 — AB (ref <=1.1)

## 2016-01-01 NOTE — Progress Notes (Signed)
Daily Session Note  Patient Details  Name: [name] MRN: [id number] Date of Birth: [birth year] Referring Provider:   Flowsheet Row CARDIAC REHAB PHASE II ORIENTATION from 12/11/2015 in [place]  Referring Provider  Dr. Haroldine Laws      Encounter Date: 01/01/2016  Check In:     Session Check In - 01/01/16 0930      Check-In   Location [facility]   Staff Present Russella Dar, MS, EP, [facility], Exercise Physiologist;Gregory Luther Parody, BS, EP, Exercise Physiologist;Ulanda Tackett Wynetta Emery, RN, BSN   Supervising physician immediately available to respond to emergencies See telemetry face sheet for immediately available MD   Medication changes reported     No   Fall or balance concerns reported    No   Warm-up and Cool-down Performed as group-led instruction   Resistance Training Performed Yes   VAD Patient? Yes     VAD patient   Has back up controller? Yes   Has spare charged batteries? Yes   Has battery cables? Yes   Has compatible battery clips? Yes     Pain Assessment   Currently in Pain? No/denies   Pain Score 0-No pain   Multiple Pain Sites No      Capillary Blood Glucose: No results found for this or any previous visit (from the past 24 hour(s)).   Goals Met:  Independence with exercise equipment Exercise tolerated well No report of cardiac concerns or symptoms Strength training completed today  Goals Unmet:  Not Applicable  Comments: Check out 1030.   Dr. Kate Sable is Medical Director for [facility] Cardiac and Pulmonary Rehab.

## 2016-01-02 LAB — COAGUCHEK XS/INR WAIVED
INR: 1.9 — ABNORMAL HIGH (ref 0.9–1.1)
Prothrombin Time: 23.1 s

## 2016-01-03 ENCOUNTER — Encounter (HOSPITAL_COMMUNITY)
Admission: RE | Admit: 2016-01-03 | Discharge: 2016-01-03 | Disposition: A | Discharge status: Home / Self Care | Payer: Medicare Other | Source: Ambulatory Visit | Attending: Internal Medicine | Admitting: Internal Medicine

## 2016-01-03 DIAGNOSIS — Z95811 Presence of heart assist device: Secondary | ICD-10-CM

## 2016-01-03 DIAGNOSIS — I5022 Chronic systolic (congestive) heart failure: Secondary | ICD-10-CM

## 2016-01-03 NOTE — Progress Notes (Signed)
Daily Session Note  Patient Details  Name: [name] MRN: [id number] Date of Birth: 02/10/1941 Referring Provider:   Flowsheet Row CARDIAC REHAB PHASE II ORIENTATION from 12/11/2015 in [place]  Referring Provider  Dr. Haroldine Laws      Encounter Date: 01/03/2016  Check In:     Session Check In - 01/03/16 0930      Check-In   Location [facility]   Staff Present Russella Dar, MS, EP, [facility], Exercise Physiologist;Debra Wynetta Emery, RN, BSN   Supervising physician immediately available to respond to emergencies See telemetry face sheet for immediately available MD   Medication changes reported     No   Fall or balance concerns reported    No   Warm-up and Cool-down Performed as group-led instruction   Resistance Training Performed Yes   VAD Patient? Yes     VAD patient   Has back up controller? Yes   Has spare charged batteries? Yes   Has battery cables? Yes   Has compatible battery clips? Yes     Pain Assessment   Currently in Pain? No/denies   Pain Score 0-No pain   Multiple Pain Sites No      Capillary Blood Glucose: No results found for this or any previous visit (from the past 24 hour(s)).   Goals Met:  Independence with exercise equipment Exercise tolerated well No report of cardiac concerns or symptoms Strength training completed today  Goals Unmet:  Not Applicable  Comments: Check out: 1030   Dr. Kate Sable is Medical Director for [place] and Pulmonary Rehab.

## 2016-01-06 ENCOUNTER — Encounter (HOSPITAL_COMMUNITY)
Admission: RE | Admit: 2016-01-06 | Discharge: 2016-01-06 | Disposition: A | Discharge status: Home / Self Care | Payer: Medicare Other | Source: Ambulatory Visit | Attending: Internal Medicine | Admitting: Internal Medicine

## 2016-01-06 DIAGNOSIS — Z95811 Presence of heart assist device: Secondary | ICD-10-CM | POA: Diagnosis not present

## 2016-01-06 NOTE — Progress Notes (Signed)
Daily Session Note  Patient Details  Name: [name] MRN: [id number] Date of Birth: [birth year] Referring Provider:   Flowsheet Row CARDIAC REHAB PHASE II ORIENTATION from 12/11/2015 in [place]  Referring Provider  Dr. Haroldine Laws      Encounter Date: 01/06/2016  Check In:     Session Check In - 01/06/16 0930      Check-In   Location [facility]   Staff Present Russella Dar, MS, EP, [facility], Exercise Physiologist;Ilsa Bonello Luther Parody, BS, EP, Exercise Physiologist   Supervising physician immediately available to respond to emergencies See telemetry face sheet for immediately available MD   Medication changes reported     No   Fall or balance concerns reported    No   Warm-up and Cool-down Performed as group-led instruction   Resistance Training Performed Yes   VAD Patient? Yes     VAD patient   Has back up controller? Yes   Has spare charged batteries? Yes   Has battery cables? Yes   Has compatible battery clips? Yes     Pain Assessment   Currently in Pain? No/denies   Pain Score 0-No pain   Multiple Pain Sites No      Capillary Blood Glucose: No results found for this or any previous visit (from the past 24 hour(s)).   Goals Met:  Independence with exercise equipment Exercise tolerated well No report of cardiac concerns or symptoms Strength training completed today  Goals Unmet:  Not Applicable  Comments: Check out 1030   Dr. Kate Sable is Medical Director for [place] and Pulmonary Rehab.

## 2016-01-08 ENCOUNTER — Encounter (HOSPITAL_COMMUNITY)
Admission: RE | Admit: 2016-01-08 | Discharge: 2016-01-08 | Disposition: A | Discharge status: Home / Self Care | Payer: Medicare Other | Source: Ambulatory Visit | Attending: Internal Medicine | Admitting: Internal Medicine

## 2016-01-08 DIAGNOSIS — K219 Gastro-esophageal reflux disease without esophagitis: Secondary | ICD-10-CM | POA: Insufficient documentation

## 2016-01-08 DIAGNOSIS — I428 Other cardiomyopathies: Secondary | ICD-10-CM | POA: Diagnosis not present

## 2016-01-08 DIAGNOSIS — M1990 Unspecified osteoarthritis, unspecified site: Secondary | ICD-10-CM | POA: Diagnosis not present

## 2016-01-08 DIAGNOSIS — Z7901 Long term (current) use of anticoagulants: Secondary | ICD-10-CM | POA: Insufficient documentation

## 2016-01-08 DIAGNOSIS — I5022 Chronic systolic (congestive) heart failure: Secondary | ICD-10-CM | POA: Diagnosis not present

## 2016-01-08 DIAGNOSIS — Z79899 Other long term (current) drug therapy: Secondary | ICD-10-CM | POA: Diagnosis not present

## 2016-01-08 DIAGNOSIS — Z95811 Presence of heart assist device: Secondary | ICD-10-CM | POA: Diagnosis present

## 2016-01-08 NOTE — Progress Notes (Signed)
Daily Session Note  Patient Details  Name: [name] MRN: [id number] Date of Birth: December 20, 1940 Referring Provider:   Flowsheet Row CARDIAC REHAB PHASE II ORIENTATION from 12/11/2015 in [place]  Referring Provider  Dr. Haroldine Laws      Encounter Date: 01/08/2016  Check In:     Session Check In - 01/08/16 0930      Check-In   Location [facility]   Staff Present Russella Dar, MS, EP, [facility], Exercise Physiologist;Gregory Luther Parody, BS, EP, Exercise Physiologist;Lilybelle Mayeda Wynetta Emery, RN, BSN   Supervising physician immediately available to respond to emergencies See telemetry face sheet for immediately available MD   Medication changes reported     No   Fall or balance concerns reported    No   Warm-up and Cool-down Performed as group-led instruction   Resistance Training Performed Yes   VAD Patient? Yes     VAD patient   Has back up controller? Yes   Has spare charged batteries? Yes   Has battery cables? Yes   Has compatible battery clips? Yes     Pain Assessment   Currently in Pain? No/denies   Pain Score 0-No pain   Multiple Pain Sites No      Capillary Blood Glucose: No results found for this or any previous visit (from the past 24 hour(s)).   Goals Met:  Independence with exercise equipment Exercise tolerated well No report of cardiac concerns or symptoms Strength training completed today  Goals Unmet:  Not Applicable  Comments: Check out 1030   Dr. Kate Sable is Medical Director for [place] and Pulmonary Rehab.

## 2016-01-09 ENCOUNTER — Telehealth (HOSPITAL_COMMUNITY): Payer: Self-pay | Admitting: Infectious Diseases

## 2016-01-09 ENCOUNTER — Ambulatory Visit (INDEPENDENT_AMBULATORY_CARE_PROVIDER_SITE_OTHER): Payer: Medicare Other | Admitting: Neurology

## 2016-01-09 ENCOUNTER — Encounter: Payer: Self-pay | Admitting: Neurology

## 2016-01-09 VITALS — BP 92/60 | HR 108 | Ht 75.0 in | Wt 187.4 lb

## 2016-01-09 DIAGNOSIS — Z1329 Encounter for screening for other suspected endocrine disorder: Secondary | ICD-10-CM | POA: Diagnosis not present

## 2016-01-09 DIAGNOSIS — E538 Deficiency of other specified B group vitamins: Secondary | ICD-10-CM | POA: Diagnosis not present

## 2016-01-09 DIAGNOSIS — I638 Other cerebral infarction: Secondary | ICD-10-CM

## 2016-01-09 DIAGNOSIS — Z79899 Other long term (current) drug therapy: Secondary | ICD-10-CM

## 2016-01-09 DIAGNOSIS — R4189 Other symptoms and signs involving cognitive functions and awareness: Secondary | ICD-10-CM | POA: Diagnosis not present

## 2016-01-09 DIAGNOSIS — I6389 Other cerebral infarction: Secondary | ICD-10-CM

## 2016-01-09 MED ORDER — CITALOPRAM HYDROBROMIDE 10 MG PO TABS: 10.0000 mg | ORAL_TABLET | Freq: Every day | ORAL | 6 refills | Status: DC

## 2016-01-09 NOTE — Progress Notes (Signed)
NEUROLOGY CONSULTATION NOTE  [name] MRN: [id number] DOB: October 23, 1940  Referring provider: Dr. Arvilla Meresaniel Bensimhon Primary care provider: Dr. 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 Although his history is well known to you, please allow me to reiterate it for the purpose of our medical record. The patient was accompanied to the clinic by his wife who also provides collateral information. Records and images were personally reviewed where available.  HISTORY OF PRESENT ILLNESS: This is a 75 year old right-handed man with a history of cardiomyopathy and left bundle branch block, who underwent LVAD in June 2017. Records were reviewed, patient had confusion post-op, with note of moderately severe cognitive impairment with decreased orientation to date and situation, decreased sustained attention to task, and poor intellectual awareness of deficits. He had previously been working full time and independent prior to admission. He underwent speech therapy and his symptoms have slowly improved but still not at baseline. His wife reports that "everything has gotten better," particularly in the past 2-3 weeks. He reports his memory is not as good as it used to be. His wife reports difficulty remembering things. When they first got home, he had to re-learn all their light fixtures. He has turned around completely with this the past month. Her main concern is that he cannot seem to comprehend his LVAD, she tells him over and over again how to do it and does not understand why it is so difficult to learn for him. He apparently can follow other instructions pretty well. He reports he is afraid to do it, because he states he only has 15 minutes and is afraid of making a mistake. He started driving last month, overall doing well, except that he occasionally  makes a wrong turn but immediately realizes it and can find his way back. She states this is a change from his baseline where he was very good with directions. Now he has to think harder. They report that he was not taking any medications prior to his illness, and now after hospitalization his wife is in charge of his medications. She has always been in charge of bills. She has noticed personality changes, and is noted to have some difficulty saying this in front of him during the visit today. She does report that he is "real hot-tempered and impatient" now. He states the reason behind this is he does not like people telling him what to do. His wife states he gets upset when he is reminded to do something. He pulls off his belts anywhere and cannot find them later. He has some paranoia that people think he is stupid, for instance when he was getting therapy and was asked questions, he felt that people thought he was stupid. He states the same thing today as we do memory testing. No difficulties with ADLs.  He denies any headaches, dizziness, diplopia, dysarthria, dysphagia, neck/back pain, focal numbness/tingling/weakness, bowel/bladder dysfunction. No anosmia, tremors, no falls. He denies any significant head injuries. No family history of dementia. No alcohol use.   Laboratory Data: Lab Results  Component Value Date   WBC 4.8 12/26/2015   HGB 10.9 (L) 12/26/2015   HCT 35.2 (L) 12/26/2015   MCV 82.4 12/26/2015   PLT 114 (L) 12/26/2015     Chemistry      Component Value Date/Time   NA 139 12/26/2015  1510   K 4.2 12/26/2015 1510   CL 106 12/26/2015 1510   CO2 24 12/26/2015 1510   BUN 14 12/26/2015 1510   CREATININE 1.00 12/26/2015 1510      Component Value Date/Time   CALCIUM 9.4 12/26/2015 1510   ALKPHOS 55 11/27/2015 0205   AST 22 11/27/2015 0205   ALT 16 (L) 11/27/2015 0205   BILITOT 0.8 11/27/2015 0205     Lab Results  Component Value Date   TSH 1.395 08/21/2015    PAST MEDICAL  HISTORY: Past Medical History:  Diagnosis Date  . Automatic implantable cardioverter-defibrillator in situ   . GERD (gastroesophageal reflux disease)   . H/O hiatal hernia   . Insomnia   . Nephrolithiasis   . Nonischemic cardiomyopathy (HCC)    Cardiac catheterization 12/2011 with LVEF 45%, no significant obstructive CAD  . Osteoarthritis   . Pneumonia    Dec 2016    PAST SURGICAL HISTORY: Past Surgical History:  Procedure Laterality Date  . APPENDECTOMY    . BI-VENTRICULAR IMPLANTABLE CARDIOVERTER DEFIBRILLATOR N/A 12/14/2012   Procedure: BI-VENTRICULAR IMPLANTABLE CARDIOVERTER DEFIBRILLATOR  (CRT-D);  Surgeon: Duke Salvia, MD;  Location: [facility] CATH LAB;  Service: Cardiovascular;  Laterality: N/A;  . BI-VENTRICULAR IMPLANTABLE CARDIOVERTER DEFIBRILLATOR  (CRT-D)  12/14/2012  . CARDIAC CATHETERIZATION  2003?; ~ 11/2012  . CARDIAC CATHETERIZATION N/A 08/22/2015   Procedure: Right/Left Heart Cath and Coronary Angiography;  Surgeon: Dolores Patty, MD;  [facility];  Service: Cardiovascular;  Laterality: N/A;  . CATARACT EXTRACTION W/PHACO Left 05/13/2015   Procedure: CATARACT EXTRACTION PHACO AND INTRAOCULAR LENS PLACEMENT LEFT EYE;  CDE:  3.49;  Surgeon: Susa Simmonds, MD;  Location: [facility] ORS;  Service: Ophthalmology;  Laterality: Left;  . CATARACT EXTRACTION W/PHACO Right 07/22/2015   Procedure: CATARACT EXTRACTION PHACO AND INTRAOCULAR LENS PLACEMENT RIGHT EYE CDE=4.50;  Surgeon: Susa Simmonds, MD;  Location: [facility] ORS;  Service: Ophthalmology;  Laterality: Right;  . CYSTOSCOPY W/ STONE MANIPULATION     "3-4 times" (12/14/2012)  . EXPLORATORY LAPAROTOMY  2006?  . INSERTION OF IMPLANTABLE LEFT VENTRICULAR ASSIST DEVICE N/A 08/30/2015   Procedure: INSERTION OF IMPLANTABLE LEFT VENTRICULAR ASSIST DEVICE;  Surgeon: Kerin Perna, MD;  Location: [facility] OR;  Service: Open Heart Surgery;  Laterality: N/A;  . INTRAOPERATIVE TRANSESOPHAGEAL ECHOCARDIOGRAM N/A 08/30/2015    Procedure: INTRAOPERATIVE TRANSESOPHAGEAL ECHOCARDIOGRAM;  Surgeon: Kerin Perna, MD;  Location: [facility] OR;  Service: Open Heart Surgery;  Laterality: N/A;  . KNEE ARTHROSCOPY Left 1990's  . LITHOTRIPSY     "twice" (12/14/2012)  . POSTERIOR FUSION CERVICAL SPINE  2001  . VOLVULUS REDUCTION  2007    MEDICATIONS: Current Outpatient Prescriptions on File Prior to Visit  Medication Sig Dispense Refill  . acetaminophen (TYLENOL) 325 MG tablet Take 2 tablets (650 mg total) by mouth every 4 (four) hours as needed for mild pain or moderate pain.    Marland Kitchen amoxicillin (AMOXIL) 500 MG capsule Take 4 capsules (2,000 mg total) by mouth once as needed (Take 30-60 minutes before dental cleaning/procedure). (Patient not taking: Reported on 12/26/2015) 12 capsule 0  . calcium carbonate (OSCAL) 1500 (600 Ca) MG TABS tablet Take by mouth daily.    Marland Kitchen Cod Liver Oil 1000 MG CAPS Take by mouth 3 (three) times a week.     . ferrous fumarate-b12-vitamic C-folic acid (TRINSICON / FOLTRIN) capsule Take 1 capsule by mouth 3 (three) times daily after meals. (Patient taking differently: Take 1 capsule by mouth 2 (two)  times daily with a meal. ) 90 capsule 0  . furosemide (LASIX) 20 MG tablet Take 1 tablet (20 mg total) by mouth daily. And as needed. 45 tablet 3  . Glucosamine Sulfate 1000 MG CAPS Take by mouth daily.    . Multiple Vitamin (MULTIVITAMIN) tablet Take 1 tablet by mouth daily.    . pantoprazole (PROTONIX) 40 MG tablet Take 1 tablet (40 mg total) by mouth daily. 30 tablet 0  . potassium chloride SA (K-DUR,KLOR-CON) 20 MEQ tablet Take 1 tablet (20 mEq total) by mouth daily. 90 tablet 3  . psyllium (REGULOID) 0.52 g capsule Take 0.52 g by mouth 2 (two) times daily after a meal.    . warfarin (COUMADIN) 3 MG tablet Take 2 tablets (6 mg total) by mouth daily at 6 PM. Or as directed by LVAD Clinic. 180 tablet 3   No current facility-administered medications on file prior to visit.     ALLERGIES: Allergies  Allergen  Reactions  . Phenergan [Promethazine Hcl] Nausea And Vomiting  . Lasix [Furosemide] Other (See Comments)    "Dropped blood pressure too low"  . Morphine And Related Other (See Comments)    Goes crazy     FAMILY HISTORY: Family History  Problem Relation Age of Onset  . Heart failure Mother   . Stroke Mother   . Asthma Mother   . Heart disease Father   . Heart failure Father   . Congestive Heart Failure Brother     @ 62 Heart transplant, [place]  . Congestive Heart Failure Brother     ICD  . Heart failure Sister     ICD  . Cancer - Ovarian Sister     deceased @ 40  . Cancer - Prostate Maternal Uncle     SOCIAL HISTORY: Social History   Social History  . Marital status: Married    Spouse name: N/A  . Number of children: N/A  . Years of education: N/A   Occupational History  . Not on file.   Social History Main Topics  . Smoking status: Never Smoker  . Smokeless tobacco: Current User    Types: Chew  . Alcohol use No  . Drug use: No  . Sexual activity: Yes   Other Topics Concern  . 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 Married 51 years and live in [place], [place].    REVIEW OF SYSTEMS: Constitutional: No fevers, chills, or sweats, no generalized fatigue, change in appetite Eyes: No visual changes, double vision, eye pain Ear, nose and throat: No hearing loss, ear pain, nasal congestion, sore throat Cardiovascular: No chest pain, palpitations Respiratory:  No shortness of breath at rest or with exertion, wheezes GastrointestinaI: No nausea, vomiting, diarrhea, abdominal pain, fecal incontinence Genitourinary:  No dysuria, urinary retention or frequency Musculoskeletal:  No neck pain, back pain Integumentary: No rash, pruritus, skin lesions Neurological: as above Psychiatric: No depression, insomnia, anxiety Endocrine: No palpitations, fatigue, diaphoresis, mood swings, change in appetite, change in weight, increased  thirst Hematologic/Lymphatic:  No anemia, purpura, petechiae. Allergic/Immunologic: no itchy/runny eyes, nasal congestion, recent allergic reactions, rashes  PHYSICAL EXAM: Vitals:   01/09/16 1039  BP: 92/60  Pulse: (!) 108   General: No acute distress Head:  Normocephalic/atraumatic Eyes: Fundoscopic exam shows bilateral sharp discs, no vessel changes, exudates, or hemorrhages Neck: supple, no paraspinal tenderness, full range of motion Back: No paraspinal tenderness Heart: regular rate and rhythm Lungs:  Clear to auscultation bilaterally. Vascular: No carotid bruits. Skin/Extremities: No rash, no edema Neurological Exam: Mental status: alert and oriented to person, place, and time, no dysarthria or aphasia, Fund of knowledge is appropriate.  Remote memory intact.  Attention and concentration are reduced.  Able to repeat, difficulty with naming. Initially called a lion a dog, then corrected himself. Called a rhinoceros a cow, a camel a giraffe. He became upset during testing, refusing to do certain parts (serial 7s, recall), stating he did not want to do it and saying "I'm just stupid."  Montreal Cognitive Assessment  01/09/2016  Visuospatial/ Executive (0/5) 3  Naming (0/3) 1  Attention: Read list of digits (0/2) 1  Attention: Read list of letters (0/1) 1  Attention: Serial 7 subtraction starting at 100 (0/3) 0  Language: Repeat phrase (0/2) 2  Language : Fluency (0/1) 0  Abstraction (0/2) 2  Delayed Recall (0/5) 0  Orientation (0/6) 6  Total 16   Cranial nerves: CN I: not tested CN II: pupils equal, round and reactive to light, visual fields intact, fundi unremarkable. CN III, IV, VI:  full range of motion, no nystagmus, no ptosis CN V: facial sensation intact CN VII: upper and lower face symmetric CN VIII: hearing intact to finger rub CN IX, X: gag intact, uvula midline CN XI: sternocleidomastoid and trapezius muscles intact CN XII: tongue midline Bulk & Tone: normal, no  fasciculations. Motor: 5/5 throughout with no pronator drift. Sensation: intact to light touch, cold, pin, vibration and joint position sense.  No extinction to double simultaneous stimulation.  Romberg test negative Deep Tendon Reflexes: +2 throughout, no ankle clonus Plantar responses: downgoing bilaterally Cerebellar: no incoordination on finger to nose, heel to shin. No dysdiadochokinesia Gait: narrow-based and steady, able to tandem walk adequately. Tremor: none  IMPRESSION: This is a 75 year old right-handed man with a history of cardiomyopathy and left bundle branch block, who underwent LVAD in June 2017. Records were reviewed, patient had confusion post-op, with note of moderately severe cognitive impairment while in the hospital. His wife states that he has slowly improved, but he continues to have cognitive changes which are noted in the office today. His MOCA score is 16/30, which indicates mild dementia, however some part of it is irritability and frustration, he started getting upset and refused to do some parts of the [facility] test, saying "I'm just stupid." We discussed cognitive changes which can occur after LVAD, sometimes due to stroke or subclinical cerebral ischemia. He is unable to do an MRI due to LVAD, a head CT without contrast will be ordered. His wife reports an episode of right leg weakness after surgery, which improved. No weakness noted today. Check TSH and B12 for treatable causes of memory loss. We also discussed how mood changes (depression, anxiety, stress) can also cause memory changes (pseudodementia), his wife feels this is contributing significantly, he is agreeable to starting a low dose SSRI. Side effects of Celexa were discussed. If mood changes improve and cognitive issues are unchanged, we will consider starting Aricept. We discussed the importance of control of vascular risk factors, physical exercise, and brain stimulation exercises for brain health. He will  follow-up in 3 months.  Thank you for allowing me to participate in the care of this patient. Please do not hesitate to call for any questions or concerns.   Patrcia Dolly, M.D.  CC: Dr. Gala Romney, Dr. Reuel Boom

## 2016-01-09 NOTE — Telephone Encounter (Signed)
Called re INR appointment for 11/1. He did not go for labwork this day as they were unaware of the need. Unable to have drawn this week (fatigue from so many appointments). Also needing [place] to perform bloodwork for Dr. Karel Jarvis (TSH / Vitamin B12). INR to be drawn next week.   Since he is 3 months on warfarin will discuss with them next week about possibility of home INR monitoring with Dr. Gala Romney to assist with monitoring.

## 2016-01-09 NOTE — Patient Instructions (Signed)
1. Schedule head CT without contrast 2. Bloodwork for TSH, B12 3. Start Celexa 10mg  daily 4. Physical exercise and brain stimulation exercises (crossword puzzles, word search, reading, etc) are important for brain health 5. Follow-up in 3 months

## 2016-01-10 ENCOUNTER — Encounter: Payer: Self-pay | Admitting: Neurology

## 2016-01-10 ENCOUNTER — Encounter (HOSPITAL_COMMUNITY)
Admission: RE | Admit: 2016-01-10 | Discharge: 2016-01-10 | Disposition: A | Discharge status: Home / Self Care | Payer: Medicare Other | Source: Ambulatory Visit | Attending: Internal Medicine | Admitting: Internal Medicine

## 2016-01-10 DIAGNOSIS — Z95811 Presence of heart assist device: Secondary | ICD-10-CM

## 2016-01-10 DIAGNOSIS — R4189 Other symptoms and signs involving cognitive functions and awareness: Secondary | ICD-10-CM | POA: Insufficient documentation

## 2016-01-10 NOTE — Progress Notes (Signed)
Daily Session Note  Patient Details  Name: [name] MRN: [id number] Date of Birth: 1940/12/28 Referring Provider:   Flowsheet Row CARDIAC REHAB PHASE II ORIENTATION from 12/11/2015 in [place]  Referring Provider  Dr. Haroldine Laws      Encounter Date: 01/10/2016  Check In:     Session Check In - 01/10/16 0930      Check-In   Location [facility]   Staff Present Russella Dar, MS, EP, [facility], Exercise Physiologist;Gregory Luther Parody, BS, EP, Exercise Physiologist;Hurshell Dino Wynetta Emery, RN, BSN   Supervising physician immediately available to respond to emergencies See telemetry face sheet for immediately available MD   Medication changes reported     No   Fall or balance concerns reported    No   Warm-up and Cool-down Performed as group-led instruction   Resistance Training Performed Yes   VAD Patient? Yes     VAD patient   Has back up controller? Yes   Has spare charged batteries? Yes   Has battery cables? Yes   Has compatible battery clips? Yes     Pain Assessment   Currently in Pain? No/denies   Pain Score 0-No pain   Multiple Pain Sites No      Capillary Blood Glucose: No results found for this or any previous visit (from the past 24 hour(s)).   Goals Met:  Independence with exercise equipment Exercise tolerated well No report of cardiac concerns or symptoms Strength training completed today  Goals Unmet:  Not Applicable  Comments: Check out 1030.   Dr. Kate Sable is Medical Director for [facility] Cardiac and Pulmonary Rehab.

## 2016-01-13 ENCOUNTER — Encounter (HOSPITAL_COMMUNITY)
Admission: RE | Admit: 2016-01-13 | Discharge: 2016-01-13 | Disposition: A | Discharge status: Home / Self Care | Payer: Medicare Other | Source: Ambulatory Visit | Attending: Internal Medicine | Admitting: Internal Medicine

## 2016-01-13 ENCOUNTER — Ambulatory Visit
Admission: RE | Admit: 2016-01-13 | Discharge: 2016-01-13 | Disposition: A | Discharge status: Home / Self Care | Payer: Medicare Other | Source: Ambulatory Visit | Attending: Neurology | Admitting: Neurology

## 2016-01-13 DIAGNOSIS — Z95811 Presence of heart assist device: Secondary | ICD-10-CM | POA: Diagnosis not present

## 2016-01-13 DIAGNOSIS — R4189 Other symptoms and signs involving cognitive functions and awareness: Secondary | ICD-10-CM

## 2016-01-13 DIAGNOSIS — I6389 Other cerebral infarction: Secondary | ICD-10-CM

## 2016-01-13 NOTE — Progress Notes (Signed)
Daily Session Note  Patient Details  Name: [name] MRN: [id number] Date of Birth: 1940-08-03 Referring Provider:   Flowsheet Row CARDIAC REHAB PHASE II ORIENTATION from 12/11/2015 in [place]  Referring Provider  Dr. Haroldine Laws      Encounter Date: 01/13/2016  Check In:     Session Check In - 01/13/16 0930      Check-In   Location [facility]   Staff Present Russella Dar, MS, EP, [facility], Exercise Physiologist;Gregory Luther Parody, BS, EP, Exercise Physiologist;Sachi Boulay Wynetta Emery, RN, BSN   Supervising physician immediately available to respond to emergencies See telemetry face sheet for immediately available MD   Medication changes reported     No   Fall or balance concerns reported    No   Warm-up and Cool-down Performed as group-led instruction   Resistance Training Performed Yes   VAD Patient? Yes     VAD patient   Has back up controller? Yes   Has spare charged batteries? Yes   Has battery cables? Yes   Has compatible battery clips? Yes     Pain Assessment   Currently in Pain? No/denies   Pain Score 0-No pain   Multiple Pain Sites No      Capillary Blood Glucose: No results found for this or any previous visit (from the past 24 hour(s)).   Goals Met:  Independence with exercise equipment Exercise tolerated well No report of cardiac concerns or symptoms Strength training completed today  Goals Unmet:  Not Applicable  Comments: Check out 1030.   Dr. Kate Sable is Medical Director for [facility] Cardiac and Pulmonary Rehab.

## 2016-01-15 ENCOUNTER — Encounter (HOSPITAL_COMMUNITY)
Admission: RE | Admit: 2016-01-15 | Discharge: 2016-01-15 | Disposition: A | Discharge status: Home / Self Care | Payer: Medicare Other | Source: Ambulatory Visit | Attending: Internal Medicine | Admitting: Internal Medicine

## 2016-01-15 ENCOUNTER — Ambulatory Visit (HOSPITAL_COMMUNITY)
Admission: RE | Admit: 2016-01-15 | Discharge: 2016-01-15 | Disposition: A | Discharge status: Home / Self Care | Payer: Medicare Other | Source: Ambulatory Visit | Attending: Internal Medicine | Admitting: Internal Medicine

## 2016-01-15 ENCOUNTER — Other Ambulatory Visit (HOSPITAL_COMMUNITY): Payer: Self-pay | Admitting: Pharmacist

## 2016-01-15 ENCOUNTER — Ambulatory Visit (HOSPITAL_COMMUNITY): Payer: Self-pay | Admitting: Infectious Diseases

## 2016-01-15 DIAGNOSIS — Z95811 Presence of heart assist device: Secondary | ICD-10-CM

## 2016-01-15 DIAGNOSIS — I429 Cardiomyopathy, unspecified: Secondary | ICD-10-CM | POA: Diagnosis not present

## 2016-01-15 DIAGNOSIS — Z1329 Encounter for screening for other suspected endocrine disorder: Secondary | ICD-10-CM | POA: Diagnosis not present

## 2016-01-15 DIAGNOSIS — I5022 Chronic systolic (congestive) heart failure: Secondary | ICD-10-CM

## 2016-01-15 DIAGNOSIS — E538 Deficiency of other specified B group vitamins: Secondary | ICD-10-CM | POA: Diagnosis not present

## 2016-01-15 DIAGNOSIS — Z79899 Other long term (current) drug therapy: Secondary | ICD-10-CM

## 2016-01-15 DIAGNOSIS — R413 Other amnesia: Secondary | ICD-10-CM

## 2016-01-15 LAB — BASIC METABOLIC PANEL
ANION GAP: 6 (ref 5–15)
BUN: 12 mg/dL (ref 6–20)
CO2: 28 mmol/L (ref 22–32)
Calcium: 9 mg/dL (ref 8.9–10.3)
Chloride: 107 mmol/L (ref 101–111)
Creatinine, Ser: 0.96 mg/dL (ref 0.61–1.24)
GFR calc Af Amer: >60 mL/min (ref >60)
GLUCOSE: 105 mg/dL — AB (ref 65–99)
POTASSIUM: 4 mmol/L (ref 3.5–5.1)
Sodium: 141 mmol/L (ref 135–145)

## 2016-01-15 LAB — CBC
HEMATOCRIT: 36.3 % — AB (ref 39.0–52.0)
HEMOGLOBIN: 11.1 g/dL — AB (ref 13.0–17.0)
MCH: 25.6 pg — AB (ref 26.0–34.0)
MCHC: 30.6 g/dL (ref 30.0–36.0)
MCV: 83.8 fL (ref 78.0–100.0)
Platelets: 77 10*3/uL — ABNORMAL LOW (ref 150–400)
RBC: 4.33 MIL/uL (ref 4.22–5.81)
RDW: 16.9 % — AB (ref 11.5–15.5)
WBC: 4.6 10*3/uL (ref 4.0–10.5)

## 2016-01-15 LAB — PROTIME-INR
INR: 1.49
Prothrombin Time: 18.2 seconds — ABNORMAL HIGH (ref 11.4–15.2)

## 2016-01-15 LAB — VITAMIN B12: Vitamin B-12: 856 pg/mL (ref 180–914)

## 2016-01-15 LAB — TSH: TSH: 1.643 u[IU]/mL (ref 0.350–4.500)

## 2016-01-15 LAB — LACTATE DEHYDROGENASE: LDH: 208 U/L — AB (ref 98–192)

## 2016-01-15 MED ORDER — ENOXAPARIN SODIUM 40 MG/0.4ML ~~LOC~~ SOLN: 40.0000 mg | Freq: Two times a day (BID) | SUBCUTANEOUS | 1 refills | Status: DC

## 2016-01-15 NOTE — Progress Notes (Signed)
Patient presents for 3 week follow up in VAD Clinic today. Reports no problems with VAD equipment or concerns with drive line. Reports he is taking lasix 5 - 6 times a week. Swelling improved. Performing Cardiac Rehab 3x weekly.   Vital Signs:  Doppler Pressure: 90 Automatc BP: 102/65 (85)  HR:  90 SPO2: 98 %  Weight: 191 lb w/ eqt Last weight: 190 lb   VAD Indication: Destination 50 - age excluding.   VAD interrogation & Equipment Management: Speed: 9200 Flow: 5.0 Power: 5.3 w    PI: 7.0  Alarms: no clinical alarms  Events: rare  Fixed speed: 9200 Low speed limit: 8600  Primary Controller:  Replace back up battery in 24 months. Back up controller:   Replace back up battery in 24 months.  Annual Equipment Maintenance on UBC/PM was performed on 09/2015.   I reviewed the LVAD parameters from today and compared the results to the patient's prior recorded data and reviewed with Dr. Gala Romney. LVAD interrogation was NEGATIVE for significant power changes, NEGATIVE for clinical alarms and STABLE for PI events/speed drops. No programming changes were made and pump is functioning within specified parameters. Pt is performing daily controller and system monitor self tests along with completing weekly and monthly maintenance for LVAD equipment.  LVAD equipment check completed and is in good working order. Back-up equipment present. Charged back up battery and performed self-test on equipment.   Exit Site Care: Drive line is being maintained weekly by wife Carney Bern. Drive line exit site well healed and incorporated. The velour is fully implanted at exit site. Dressing dry and intact. No erythema or drainage. Stabilization device present and accurately applied. Pt denies fever or chills. Pt provided with 8 weekly kits today.   Requested new wearable - provided patient with Consolidated Bag today.   Significant Events on VAD Support:  11/2015 >> Nosebleeds requiring packing x 2  (off ASA)  Device: Medtronic CRT-D Therapies: VF On [place] Monitor Last check: 02/2015  BP & Labs:  MAP 85 - 90 - Doppler is reflecting MAP  Hgb 11.1 - No S/S of bleeding. Specifically denies melena/BRBPR or nosebleeds.  LDH stable at 208 with established baseline of 190 - 230. Denies tea-colored urine. No power elevations noted on interrogation.    [name], RN VAD Coordinator   Office: [phone] 24/7 Emergency VAD Pager: [phone]

## 2016-01-15 NOTE — Progress Notes (Signed)
Daily Session Note  Patient Details  Name: TEOFILO LUPINACCI MRN: [id number] Date of Birth: [birth year] Referring Provider:   Flowsheet Row CARDIAC REHAB PHASE II ORIENTATION from 12/11/2015 in [place]  Referring Provider  Dr. Haroldine Laws      Encounter Date: 01/15/2016  Check In:     Session Check In - 01/15/16 0930      Check-In   Location [facility]   Staff Present Suzanne Boron, BS, EP, Exercise Physiologist;Schae Cando Wynetta Emery, RN, BSN   Supervising physician immediately available to respond to emergencies See telemetry face sheet for immediately available MD   Medication changes reported     No   Fall or balance concerns reported    No   Warm-up and Cool-down Performed as group-led instruction   Resistance Training Performed Yes   VAD Patient? Yes     VAD patient   Has back up controller? Yes   Has spare charged batteries? Yes   Has battery cables? Yes   Has compatible battery clips? Yes     Pain Assessment   Currently in Pain? No/denies   Pain Score 0-No pain   Multiple Pain Sites No      Capillary Blood Glucose: No results found for this or any previous visit (from the past 24 hour(s)).   Goals Met:  Independence with exercise equipment Exercise tolerated well No report of cardiac concerns or symptoms Strength training completed today  Goals Unmet:  Not Applicable  Comments: Check out 1030.   Dr. Kate Sable is Medical Director for [facility] Cardiac and Pulmonary Rehab.

## 2016-01-15 NOTE — Patient Instructions (Signed)
No medicine changes today.   Back in 6 weeks to see [place]. Will be doing a 6 minute walk test so wear comfortable shoes.

## 2016-01-16 ENCOUNTER — Telehealth (HOSPITAL_COMMUNITY): Payer: Self-pay | Admitting: Pharmacist

## 2016-01-16 ENCOUNTER — Telehealth: Payer: Self-pay

## 2016-01-16 NOTE — Telephone Encounter (Signed)
Enoxaparin 40 mg BID PA approved by [employer] Part D through 02/15/16.   Tyler Deis. Bonnye Fava, PharmD, BCPS, CPP Clinical Pharmacist Pager: [phone] Phone: [phone] 01/16/2016 3:23 PM

## 2016-01-16 NOTE — Telephone Encounter (Signed)
Patients wife notified

## 2016-01-16 NOTE — Telephone Encounter (Signed)
-----   Message from Van Clines, MD sent at 01/15/2016  4:43 PM EST ----- Pls let him know b12 and thyroid are normal, thanks

## 2016-01-17 ENCOUNTER — Encounter (HOSPITAL_COMMUNITY): Payer: Medicare Other

## 2016-01-17 ENCOUNTER — Other Ambulatory Visit: Payer: Self-pay | Admitting: Internal Medicine

## 2016-01-17 ENCOUNTER — Telehealth (HOSPITAL_COMMUNITY): Payer: Self-pay | Admitting: Infectious Diseases

## 2016-01-17 ENCOUNTER — Telehealth: Payer: Self-pay

## 2016-01-17 NOTE — Telephone Encounter (Signed)
Called to ensure they had INR drawn today - no answer but VM left requesting call back. Patient is on Lovenox and need INR to verify we can stop medication. Attempted to call [facility] on [address] however unable to reach a person to talk with.

## 2016-01-17 NOTE — Telephone Encounter (Signed)
-----   Message from Van Clines, MD sent at 01/17/2016  9:06 AM EST ----- Pls let wife know the brain scan did not show any tumor, stroke, or bleed. No evidence of old stroke. It showed age-related changes. Thanks

## 2016-01-17 NOTE — Telephone Encounter (Signed)
Patient notified and verbalizes understanding.

## 2016-01-18 LAB — PROTIME-INR
INR: 1.7 — ABNORMAL HIGH (ref 0.8–1.2)
PROTHROMBIN TIME: 17 s — AB (ref 9.1–12.0)

## 2016-01-20 ENCOUNTER — Encounter (HOSPITAL_COMMUNITY)
Admission: RE | Admit: 2016-01-20 | Discharge: 2016-01-20 | Disposition: A | Discharge status: Home / Self Care | Payer: Medicare Other | Source: Ambulatory Visit | Attending: Internal Medicine | Admitting: Internal Medicine

## 2016-01-20 DIAGNOSIS — Z95811 Presence of heart assist device: Secondary | ICD-10-CM | POA: Diagnosis not present

## 2016-01-20 NOTE — Progress Notes (Signed)
Daily Session Note  Patient Details  Name: [name] MRN: [id number] Date of Birth: 10/30/1940 Referring Provider:   Flowsheet Row CARDIAC REHAB PHASE II ORIENTATION from 12/11/2015 in [place]  Referring Provider  Dr. Haroldine Laws      Encounter Date: 01/20/2016  Check In:     Session Check In - 01/20/16 0930      Check-In   Location [facility]   Staff Present Russella Dar, MS, EP, [facility], Exercise Physiologist;Nealy Karapetian Wynetta Emery, RN, BSN;Gregory Cowan, BS, EP, Exercise Physiologist   Supervising physician immediately available to respond to emergencies See telemetry face sheet for immediately available MD   Medication changes reported     No   Fall or balance concerns reported    No   Warm-up and Cool-down Performed as group-led instruction   Resistance Training Performed Yes   VAD Patient? Yes     VAD patient   Has back up controller? Yes   Has spare charged batteries? Yes   Has battery cables? Yes   Has compatible battery clips? Yes     Pain Assessment   Currently in Pain? No/denies   Pain Score 0-No pain   Multiple Pain Sites No      Capillary Blood Glucose: No results found for this or any previous visit (from the past 24 hour(s)).   Goals Met:  Independence with exercise equipment Exercise tolerated well No report of cardiac concerns or symptoms Strength training completed today  Goals Unmet:  Not Applicable  Comments: Check out 1030.   Dr. Kate Sable is Medical Director for [facility] Cardiac and Pulmonary Rehab.

## 2016-01-20 NOTE — Progress Notes (Signed)
Cardiac Individual Treatment Plan  Patient Details  Name: [name] MRN: [id number] Date of Birth: 1940-08-05 Referring Provider:   Flowsheet Row CARDIAC REHAB PHASE II ORIENTATION from 12/11/2015 in [place]  Referring Provider  Dr. Haroldine Laws      Initial Encounter Date:  Flowsheet Row CARDIAC REHAB PHASE II ORIENTATION from 12/11/2015 in [place]  Date  12/11/15  Referring Provider  Dr. Haroldine Laws      Visit Diagnosis: LVAD (left ventricular assist device) present [facility])  Patient's Home Medications on Admission:  Current Outpatient Prescriptions:  .  acetaminophen (TYLENOL) 325 MG tablet, Take 2 tablets (650 mg total) by mouth every 4 (four) hours as needed for mild pain or moderate pain., Disp: , Rfl:  .  amoxicillin (AMOXIL) 500 MG capsule, Take 4 capsules (2,000 mg total) by mouth once as needed (Take 30-60 minutes before dental cleaning/procedure). (Patient not taking: Reported on 01/15/2016), Disp: 12 capsule, Rfl: 0 .  calcium carbonate (OSCAL) 1500 (600 Ca) MG TABS tablet, Take by mouth daily., Disp: , Rfl:  .  citalopram (CELEXA) 10 MG tablet, Take 1 tablet (10 mg total) by mouth daily., Disp: 30 tablet, Rfl: 6 .  Cod Liver Oil 1000 MG CAPS, Take by mouth 3 (three) times a week. , Disp: , Rfl:  .  enoxaparin (LOVENOX) 40 MG/0.4ML injection, Inject 0.4 mLs (40 mg total) into the skin every 12 (twelve) hours., Disp: 10 Syringe, Rfl: 1 .  ferrous [id number] C-folic acid (TRINSICON / FOLTRIN) capsule, Take 1 capsule by mouth 3 (three) times daily after meals. (Patient taking differently: Take 1 capsule by mouth 2 (two) times daily with a meal. ), Disp: 90 capsule, Rfl: 0 .  furosemide (LASIX) 20 MG tablet, Take 1 tablet (20 mg total) by mouth daily. And as needed., Disp: 45 tablet, Rfl: 3 .  Glucosamine Sulfate 1000 MG CAPS, Take by mouth daily., Disp: , Rfl:  .  Multiple Vitamin (MULTIVITAMIN) tablet, Take 1 tablet  by mouth daily., Disp: , Rfl:  .  pantoprazole (PROTONIX) 40 MG tablet, Take 1 tablet (40 mg total) by mouth daily., Disp: 30 tablet, Rfl: 0 .  potassium chloride SA (K-DUR,KLOR-CON) 20 MEQ tablet, Take 1 tablet (20 mEq total) by mouth daily., Disp: 90 tablet, Rfl: 3 .  psyllium (REGULOID) 0.52 g capsule, Take 0.52 g by mouth 2 (two) times daily after a meal., Disp: , Rfl:  .  warfarin (COUMADIN) 3 MG tablet, Take 2 tablets (6 mg total) by mouth daily at 6 PM. Or as directed by LVAD Clinic., Disp: 180 tablet, Rfl: 3  Past Medical History: Past Medical History:  Diagnosis Date  . Automatic implantable cardioverter-defibrillator in situ   . GERD (gastroesophageal reflux disease)   . H/O hiatal hernia   . Insomnia   . Nephrolithiasis   . Nonischemic cardiomyopathy ([place])    Cardiac catheterization 12/2011 with LVEF 45%, no significant obstructive CAD  . Osteoarthritis   . Pneumonia    Dec 2016    Tobacco Use: History  Smoking Status  . Never Smoker  Smokeless Tobacco  . 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 Cardiac and Pulmonary Rehab Latest Ref Rng & Units 09/16/2015 09/17/2015 09/18/2015 09/19/2015 11/11/2015   Cholestrol 0 - 200 mg/dL - - - - -   LDLCALC 0 - 99 mg/dL - - - - -   HDL >40 mg/dL - - - - -  Trlycerides <150 mg/dL - - - - -   Hemoglobin A1c 4.8 - 5.6 % - - - - -   PHART 7.350 - 7.450 - - - - -   PCO2ART 35.0 - 45.0 mmHg - - - - -   HCO3 20.0 - 24.0 mEq/L - - - - -   TCO2 0 - 100 mmol/L - - - - 25   ACIDBASEDEF 0.0 - 2.0 mmol/L - - - - -   O2SAT % 70.0 61.9 83.4 71.8 -      Capillary Blood Glucose: Lab Results  Component Value Date   GLUCAP 133 (H) 09/19/2015   GLUCAP 106 (H) 09/19/2015   GLUCAP 183 (H) 09/18/2015   GLUCAP 120 (H) 09/18/2015   GLUCAP 166 (H) 09/18/2015     Exercise Target Goals:    Exercise Program Goal: Individual exercise prescription set with THRR, safety & activity barriers. Participant demonstrates  ability to understand and report RPE using BORG scale, to self-measure pulse accurately, and to acknowledge the importance of the exercise prescription.  Exercise Prescription Goal: Starting with aerobic activity 30 plus minutes a day, 3 days per week for initial exercise prescription. Provide home exercise prescription and guidelines that participant acknowledges understanding prior to discharge.  Activity Barriers & Risk Stratification:     Activity Barriers & Cardiac Risk Stratification - 12/11/15 1530      Activity Barriers & Cardiac Risk Stratification   Activity Barriers None   Cardiac Risk Stratification High      6 Minute Walk:     6 Minute Walk    Row Name 12/12/15 0830         6 Minute Walk   Phase Initial     Distance 1200 feet     Walk Time 6 minutes     # of Rest Breaks 0     MPH 2.27     METS 2.74     RPE 11     Perceived Dyspnea  10     VO2 Peak 8.92     Resting HR 61 bpm     Resting BP 100/80     Max Ex. HR 128 bpm     Max Ex. BP 120/80     2 Minute Post BP 100/80        Initial Exercise Prescription:     Initial Exercise Prescription - 12/12/15 0800      Date of Initial Exercise RX and Referring Provider   Date 12/11/15   Referring Provider Dr. Haroldine Laws     Treadmill   MPH 1.3   Grade 0   Minutes 15   METs 1.9     NuStep   Level 2   Watts 18   Minutes 15   METs 2.3     Arm Ergometer   Level 2   Watts 15   Minutes 20   METs 2.5     Prescription Details   Frequency (times per week) 3   Duration Progress to 30 minutes of continuous aerobic without signs/symptoms of physical distress     Intensity   THRR REST +  30   THRR 40-80% of Max Heartrate 95-113-130   Ratings of Perceived Exertion 11-13     Progression   Progression Continue to progress workloads to maintain intensity without signs/symptoms of physical distress.     Resistance Training   Training Prescription Yes   Weight 1   Reps 10-12      Perform Capillary  Blood Glucose checks as needed.  Exercise Prescription Changes:      Exercise Prescription Changes    Row Name 12/17/15 1000 01/14/16 0800           Exercise Review   Progression No Yes        Response to Exercise   Blood Pressure (Admit) 91/75  MAP 98 94/71  MAP 82      Blood Pressure (Exercise) 123/91  MAP 99 110/61  MAP 90      Blood Pressure (Exit) 115/82  MAP 94 98/71  MAP 80      Heart Rate (Admit) 69 bpm 59 bpm      Heart Rate (Exercise) 91 bpm 96 bpm      Heart Rate (Exit) 75 bpm 68 bpm      Rating of Perceived Exertion (Exercise) 11 11      Duration Progress to 30 minutes of continuous aerobic without signs/symptoms of physical distress Progress to 30 minutes of continuous aerobic without signs/symptoms of physical distress      Intensity Rest + 30 Rest + 30        Progression   Progression Continue progressive overload as per policy without signs/symptoms or physical distress. Continue progressive overload as per policy without signs/symptoms or physical distress.        Resistance Training   Training Prescription Yes Yes      Weight 1 1      Reps 10-12 10-12        Treadmill   MPH 0 0      Grade 0 0      Minutes 0 0      METs 0 0        NuStep   Level 2 2      Watts 14 16      Minutes 15 15      METs 3.58 3.59        Arm Ergometer   Level 2 2.4      Watts 9 11      Minutes 20 20      METs 1.8 2.3        Home Exercise Plan   Plans to continue exercise at Home Home      Frequency Add 2 additional days to program exercise sessions. Add 2 additional days to program exercise sessions.         Exercise Comments:      Exercise Comments    Row Name 12/17/15 1040 01/14/16 0829         Exercise Comments Patient has just started rehab and has some cognitive issues. Was not put on treadmill due to this reason as well.  Patient is progressing appropriately          Discharge Exercise Prescription (Final Exercise Prescription Changes):      Exercise Prescription Changes - 01/14/16 0800      Exercise Review   Progression Yes     Response to Exercise   Blood Pressure (Admit) 94/71  MAP 82   Blood Pressure (Exercise) 110/61  MAP 90   Blood Pressure (Exit) 98/71  MAP 80   Heart Rate (Admit) 59 bpm   Heart Rate (Exercise) 96 bpm   Heart Rate (Exit) 68 bpm   Rating of Perceived Exertion (Exercise) 11   Duration Progress to 30 minutes of continuous aerobic without signs/symptoms of physical distress   Intensity Rest + 30     Progression   Progression Continue progressive overload  as per policy without signs/symptoms or physical distress.     Resistance Training   Training Prescription Yes   Weight 1   Reps 10-12     Treadmill   MPH 0   Grade 0   Minutes 0   METs 0     NuStep   Level 2   Watts 16   Minutes 15   METs 3.59     Arm Ergometer   Level 2.4   Watts 11   Minutes 20   METs 2.3     Home Exercise Plan   Plans to continue exercise at Home   Frequency Add 2 additional days to program exercise sessions.      Nutrition:  Target Goals: Understanding of nutrition guidelines, daily intake of sodium <1542m, cholesterol <2054m calories 30% from fat and 7% or less from saturated fats, daily to have 5 or more servings of fruits and vegetables.  Biometrics:     Pre Biometrics - 12/12/15 0843      Pre Biometrics   Height 6' 3" (1.905 m)   Weight 187 lb 8 oz (85 kg)   Waist Circumference 38 inches   Hip Circumference 37 inches   Waist to Hip Ratio 1.03 %   BMI (Calculated) 23.5   Triceps Skinfold 9 mm   % Body Fat 22.6 %   Grip Strength 64.6 kg   Flexibility 0 in   Single Leg Stand 2 seconds       Nutrition Therapy Plan and Nutrition Goals:   Nutrition Discharge: Rate Your Plate Scores:     Nutrition Assessments - 12/11/15 1531      MEDFICTS Scores   Pre Score 82      Nutrition Goals Re-Evaluation:   Psychosocial: Target Goals: Acknowledge presence or absence of depression,  maximize coping skills, provide positive support system. Participant is able to verbalize types and ability to use techniques and skills needed for reducing stress and depression.  Initial Review & Psychosocial Screening:     Initial Psych Review & Screening - 12/11/15 1532      Initial Review   Current issues with Current Sleep Concerns  Patient says he does feel down and frustrated at times d/t not being able to do the things he used to do like yard work but no depression.      Family Dynamics   Good Support System? Yes     Barriers   Psychosocial barriers to participate in program There are no identifiable barriers or psychosocial needs.     Screening Interventions   Interventions Encouraged to exercise      Quality of Life Scores:     Quality of Life - 12/12/15 0845      Quality of Life Scores   Health/Function Pre 29.5 %   Socioeconomic Pre 28.44 %   Psych/Spiritual Pre 27.86 %   Family Pre 30 %   GLOBAL Pre 29 %      PHQ-9: Recent Review Flowsheet Data    [facility] 12/11[facility]2015   Decreased Interest 0   Down, Depressed, Hopeless 1   PHQ - 2 Score 1   Altered sleeping 1   Tired, decreased energy 1   Change in appetite 0   Feeling bad or failure about yourself  0   Trouble concentrating 0   Moving slowly or fidgety/restless 0   Suicidal thoughts 0   PHQ-9 Score 3   Difficult doing work/chores Not difficult at all  Psychosocial Evaluation and Intervention:     Psychosocial Evaluation - 12/11/15 1534      Psychosocial Evaluation & Interventions   Interventions Relaxation education;Encouraged to exercise with the program and follow exercise prescription;Stress management education   Continued Psychosocial Services Needed No      Psychosocial Re-Evaluation:     Psychosocial Re-Evaluation    [place] Name 01/20/16 1356             Psychosocial Re-Evaluation   Interventions Encouraged to attend Cardiac Rehabilitation for the  exercise       Comments Patient's QOL score was 29 and his PHQ-9 score was 3. No psychosocial issues identified.        Continued Psychosocial Services Needed No          Vocational Rehabilitation: Provide vocational rehab assistance to qualifying candidates.   Vocational Rehab Evaluation & Intervention:     Vocational Rehab - 12/11/15 1531      Initial Vocational Rehab Evaluation & Intervention   Assessment shows need for Vocational Rehabilitation No      Education: Education Goals: Education classes will be provided on a weekly basis, covering required topics. Participant will state understanding/return demonstration of topics presented.  Learning Barriers/Preferences:     Learning Barriers/Preferences - 12/11/15 1530      Learning Barriers/Preferences   Learning Barriers None   Learning Preferences Skilled Demonstration      Education Topics: Hypertension, Hypertension Reduction -Define heart disease and high blood pressure. Discus how high blood pressure affects the body and ways to reduce high blood pressure. Flowsheet Row CARDIAC REHAB PHASE II EXERCISE from 01/15/2016 in [place]  Date  01/08/16  Educator  DC  Instruction Review Code  2- meets goals/outcomes      Exercise and Your Heart -Discuss why it is important to exercise, the FITT principles of exercise, normal and abnormal responses to exercise, and how to exercise safely. Flowsheet Row CARDIAC REHAB PHASE II EXERCISE from 01/15/2016 in [place]  Date  01/15/16  Educator  Russella Dar  Instruction Review Code  2- meets goals/outcomes      Angina -Discuss definition of angina, causes of angina, treatment of angina, and how to decrease risk of having angina.   Cardiac Medications -Review what the following cardiac medications are used for, how they affect the body, and side effects that may occur when taking the medications.  Medications include  Aspirin, Beta blockers, calcium channel blockers, ACE Inhibitors, angiotensin receptor blockers, diuretics, digoxin, and antihyperlipidemics.   Congestive Heart Failure -Discuss the definition of CHF, how to live with CHF, the signs and symptoms of CHF, and how keep track of weight and sodium intake.   Heart Disease and Intimacy -Discus the effect sexual activity has on the heart, how changes occur during intimacy as we age, and safety during sexual activity.   Smoking Cessation / COPD -Discuss different methods to quit smoking, the health benefits of quitting smoking, and the definition of COPD.   Nutrition I: Fats -Discuss the types of cholesterol, what cholesterol does to the heart, and how cholesterol levels can be controlled.   Nutrition II: Labels -Discuss the different components of food labels and how to read food label   Heart Parts and Heart Disease -Discuss the anatomy of the heart, the pathway of blood circulation through the heart, and these are affected by heart disease.   Stress I: Signs and Symptoms -Discuss the causes of stress, how stress may lead to  anxiety and depression, and ways to limit stress. Flowsheet Row CARDIAC REHAB PHASE II EXERCISE from 01/15/2016 in [place]  Date  12/18/15  Educator  Diane coad  Instruction Review Code  2- meets goals/outcomes      Stress II: Relaxation -Discuss different types of relaxation techniques to limit stress.   Warning Signs of Stroke / TIA -Discuss definition of a stroke, what the signs and symptoms are of a stroke, and how to identify when someone is having stroke.   Knowledge Questionnaire Score:     Knowledge Questionnaire Score - 12/11/15 1531      Knowledge Questionnaire Score   Pre Score 21/24      Core Components/Risk Factors/Patient Goals at Admission:     Personal Goals and Risk Factors at Admission - 12/12/15 0746      Core Components/Risk Factors/Patient Goals on  Admission    Weight Management Weight Maintenance   Increase Strength and Stamina Yes   Intervention Provide advice, education, support and counseling about physical activity/exercise needs.;Develop an individualized exercise prescription for aerobic and resistive training based on initial evaluation findings, risk stratification, comorbidities and participant's personal goals.   Expected Outcomes Achievement of increased cardiorespiratory fitness and enhanced flexibility, muscular endurance and strength shown through measurements of functional capacity and personal statement of participant.   Heart Failure Yes   Intervention Provide a combined exercise and nutrition program that is supplemented with education, support and counseling about heart failure. Directed toward relieving symptoms such as shortness of breath, decreased exercise tolerance, and extremity edema.   Personal Goal Other Yes   Personal Goal Be able to do things on his own. Have a better attitude and get rid of fear of death.    Intervention Patient will attend program exercising 3 days/week and supplement with exercise at home 2 to 4 days/week.    Expected Outcomes Patient will complete the program and meet his above stated goals.       Core Components/Risk Factors/Patient Goals Review:      Goals and Risk Factor Review    Row Name 12/12/15 0752 01/20/16 1352           Core Components/Risk Factors/Patient Goals Review   Personal Goals Review Weight Management/Obesity;Increase Strength and Stamina;Heart Failure;Other  Be able to do things on his own. Have a better attitude and get rid of fear of death.  Weight Management/Obesity;Other  Be able to do things on his own; have a better attitude and get rid of fear of death.       Review  - Patient has had 16 sessions. He is progressing well in the program. His strength and stamina are increasing and he has improved balance. He has gained 8.5 lbs. He interacts well with staff  and other patients in his class. His attitude toward his illness is improving.       Expected Outcomes  - Patient will complete program meeting his personal goals.          Core Components/Risk Factors/Patient Goals at Discharge (Final Review):      Goals and Risk Factor Review - 01/20/16 1352      Core Components/Risk Factors/Patient Goals Review   Personal Goals Review Weight Management/Obesity;Other  Be able to do things on his own; have a better attitude and get rid of fear of death.    Review Patient has had 16 sessions. He is progressing well in the program. His strength and stamina are increasing and he has improved  balance. He has gained 8.5 lbs. He interacts well with staff and other patients in his class. His attitude toward his illness is improving.    Expected Outcomes Patient will complete program meeting his personal goals.       ITP Comments:     ITP Comments    Row Name 12/12/15 1520 12/19/15 1308         ITP Comments Patient met with Registered Dietitian to discuss nutrition topics including: Heart healthty eating, heart health cooking and make smart choices when shopping; Portion control; weight management; and hydration. Patient attended a group session with the hospital chaplian called Family Structure to discuss and share how his recent cardiac diagnosis has effected his life.  Patient new to program. He has had 3 sessions. Will continue to monitor for progress.          Comments: .ITP 30 Day REVIEW Patient doing well with program. Will continue to monitor for progress.

## 2016-01-21 ENCOUNTER — Ambulatory Visit (HOSPITAL_COMMUNITY): Payer: Self-pay | Admitting: Pharmacist

## 2016-01-21 ENCOUNTER — Other Ambulatory Visit: Payer: Self-pay | Admitting: Internal Medicine

## 2016-01-21 DIAGNOSIS — Z95811 Presence of heart assist device: Secondary | ICD-10-CM

## 2016-01-21 LAB — PROTIME-INR: INR: 1.6 — AB (ref <=1.1)

## 2016-01-22 ENCOUNTER — Encounter (HOSPITAL_COMMUNITY)
Admission: RE | Admit: 2016-01-22 | Discharge: 2016-01-22 | Disposition: A | Discharge status: Home / Self Care | Payer: Medicare Other | Source: Ambulatory Visit | Attending: Internal Medicine | Admitting: Internal Medicine

## 2016-01-22 ENCOUNTER — Ambulatory Visit: Payer: Medicare Other | Admitting: Neurology

## 2016-01-22 DIAGNOSIS — Z95811 Presence of heart assist device: Secondary | ICD-10-CM

## 2016-01-22 LAB — PROTIME-INR
INR: 1.6 — ABNORMAL HIGH (ref 0.8–1.2)
Prothrombin Time: 16.2 s — ABNORMAL HIGH (ref 9.1–12.0)

## 2016-01-22 NOTE — Progress Notes (Signed)
Daily Session Note  Patient Details  Name: [name] MRN: [id number] Date of Birth: [birth year] Referring Provider:   Flowsheet Row CARDIAC REHAB PHASE II ORIENTATION from 12/11/2015 in [place]  Referring Provider  Dr. Haroldine Laws      Encounter Date: 01/22/2016  Check In:     Session Check In - 01/22/16 0930      Check-In   Location [facility]   Staff Present Suzanne Boron, BS, EP, Exercise Physiologist;Ernestine Rohman Wynetta Emery, RN, BSN   Supervising physician immediately available to respond to emergencies See telemetry face sheet for immediately available MD   Medication changes reported     No   Fall or balance concerns reported    No   Warm-up and Cool-down Performed as group-led instruction   Resistance Training Performed Yes   VAD Patient? Yes     VAD patient   Has back up controller? Yes   Has spare charged batteries? Yes   Has battery cables? Yes   Has compatible battery clips? Yes     Pain Assessment   Currently in Pain? No/denies   Pain Score 0-No pain   Multiple Pain Sites No      Capillary Blood Glucose: No results found for this or any previous visit (from the past 24 hour(s)).   Goals Met:  Independence with exercise equipment Exercise tolerated well No report of cardiac concerns or symptoms Strength training completed today  Goals Unmet:  Not Applicable  Comments: Check out 1030.   Dr. Kate Sable is Medical Director for [facility] Cardiac and Pulmonary Rehab.

## 2016-01-23 ENCOUNTER — Other Ambulatory Visit: Payer: Self-pay | Admitting: Internal Medicine

## 2016-01-23 ENCOUNTER — Ambulatory Visit (HOSPITAL_COMMUNITY): Payer: Self-pay | Admitting: Pharmacist

## 2016-01-23 DIAGNOSIS — Z95811 Presence of heart assist device: Secondary | ICD-10-CM

## 2016-01-23 LAB — COAGUCHEK XS/INR WAIVED
INR: 2.1 — ABNORMAL HIGH (ref 0.9–1.1)
Prothrombin Time: 25.2 s

## 2016-01-23 LAB — PROTIME-INR: INR: 2.1 — AB (ref <=1.1)

## 2016-01-24 ENCOUNTER — Encounter (HOSPITAL_COMMUNITY)
Admission: RE | Admit: 2016-01-24 | Discharge: 2016-01-24 | Disposition: A | Discharge status: Home / Self Care | Payer: Medicare Other | Source: Ambulatory Visit | Attending: Internal Medicine | Admitting: Internal Medicine

## 2016-01-24 DIAGNOSIS — Z95811 Presence of heart assist device: Secondary | ICD-10-CM

## 2016-01-24 NOTE — Progress Notes (Signed)
Daily Session Note  Patient Details  Name: [name] MRN: [id number] Date of Birth: 04/04/1940 Referring Provider:   Flowsheet Row CARDIAC REHAB PHASE II ORIENTATION from 12/11/2015 in [place]  Referring Provider  Dr. Haroldine Laws      Encounter Date: 01/24/2016  Check In:     Session Check In - 01/24/16 0930      Check-In   Location [facility]   Staff Present Suzanne Boron, BS, EP, Exercise Physiologist;Zari Cly Wynetta Emery, RN, BSN   Supervising physician immediately available to respond to emergencies See telemetry face sheet for immediately available MD   Medication changes reported     No   Fall or balance concerns reported    No   Warm-up and Cool-down Performed as group-led instruction   Resistance Training Performed Yes   VAD Patient? Yes     VAD patient   Has back up controller? Yes   Has spare charged batteries? Yes   Has battery cables? Yes   Has compatible battery clips? Yes     Pain Assessment   Currently in Pain? No/denies   Pain Score 0-No pain   Multiple Pain Sites No      Capillary Blood Glucose: Results for orders placed or performed in visit on 01/23/16 (from the past 24 hour(s))  CoaguChek XS/INR Waived     Status: Abnormal   Collection Time: 01/23/16 11:34 AM  Result Value Ref Range   INR 2.1 (H) 0.9 - 1.1   Prothrombin Time 25.2 sec   Narrative   Performed at:  [facility], [place], [place]  [id number] Lab Director: Darrel Hoover MD, Phone:  [phone] Specimen Comment: A courtesy copy of this report has been sent to Specimen Comment: [phone].     Goals Met:  Independence with exercise equipment Exercise tolerated well No report of cardiac concerns or symptoms Strength training completed today  Goals Unmet:  Not Applicable  Comments: Check out 1030.   Dr. Kate Sable is Medical Director for [facility] Cardiac and Pulmonary Rehab.

## 2016-01-27 ENCOUNTER — Encounter (HOSPITAL_COMMUNITY)
Admission: RE | Admit: 2016-01-27 | Discharge: 2016-01-27 | Disposition: A | Discharge status: Home / Self Care | Payer: Medicare Other | Source: Ambulatory Visit | Attending: Internal Medicine | Admitting: Internal Medicine

## 2016-01-27 DIAGNOSIS — Z95811 Presence of heart assist device: Secondary | ICD-10-CM

## 2016-01-27 NOTE — Progress Notes (Signed)
Daily Session Note  Patient Details  Name: [name] MRN: [id number] Date of Birth: 13-Apr-1940 Referring Provider:   Flowsheet Row CARDIAC REHAB PHASE II ORIENTATION from 12/11/2015 in [place]  Referring Provider  Dr. Haroldine Laws      Encounter Date: 01/27/2016  Check In:     Session Check In - 01/27/16 0930      Check-In   Location [facility]   Staff Present Suzanne Boron, BS, EP, Exercise Physiologist;Diane Coad, MS, EP, CHC, Exercise Physiologist;Solomon Skowronek Wynetta Emery, RN, BSN   Supervising physician immediately available to respond to emergencies See telemetry face sheet for immediately available MD   Medication changes reported     No   Fall or balance concerns reported    No   Warm-up and Cool-down Performed as group-led instruction   Resistance Training Performed Yes   VAD Patient? Yes     VAD patient   Has back up controller? Yes   Has spare charged batteries? Yes   Has battery cables? Yes   Has compatible battery clips? Yes     Pain Assessment   Currently in Pain? No/denies   Pain Score 0-No pain   Multiple Pain Sites No      Capillary Blood Glucose: No results found for this or any previous visit (from the past 24 hour(s)).   Goals Met:  Independence with exercise equipment Exercise tolerated well No report of cardiac concerns or symptoms Strength training completed today  Goals Unmet:  Not Applicable  Comments: Check out 1030.   Dr. Kate Sable is Medical Director for [facility] Cardiac and Pulmonary Rehab.

## 2016-01-28 NOTE — Progress Notes (Signed)
LVAD CLINIC NOTE  HPI: 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   Post-op course c/b delirium/sundowning, urinary retention and deconditioning. Was eventually transferred to CIR.   Follow up for Heart Failure/LVAD: Returns for routine f/u. Continues to feel better. Now going to CR 3x/week. Denies CP or SOB. Taking lasix 4-5x/week. Edema much improved. He is now off ASA due to epistaxis.  MAP is stable. Continue to struggle with his cognitive function. Seen by Neurology and scored 16/30 on MSSE. CT brain pending.   Denies LVAD alarms.  Denies driveline trauma, erythema or drainage.  Denies ICD shocks.   Reports taking Coumadin as prescribed and adherence to anticoagulation based dietary restrictions.  Denies bright red blood per rectum or melena, no dark urine or hematuria. No focal. neuro sx.   VAD interrogation & Equipment Management: Speed: 9200 Flow: 5.0 Power: 5.3 w PI: 7.0  Alarms: no clinical alarms Events: rare  Fixed speed: 9200 Low speed limit: 8600  Primary Controller: Replace back up battery in 24months. Back up controller: Replace back up battery in 24months.    Past Medical History:  Diagnosis Date  . Automatic implantable cardioverter-defibrillator in situ   . GERD (gastroesophageal reflux disease)   . H/O hiatal hernia   . Insomnia   . Nephrolithiasis   . Nonischemic cardiomyopathy (HCC)    Cardiac catheterization 12/2011 with LVEF 45%, no significant obstructive CAD  . Osteoarthritis   . Pneumonia    Dec 2016    Current Outpatient Prescriptions  Medication Sig Dispense Refill  . acetaminophen (TYLENOL) 325 MG tablet Take 2 tablets (650 mg total) by mouth every 4 (four) hours as needed for mild pain or moderate pain.    . calcium carbonate (OSCAL) 1500 (600 Ca) MG TABS tablet Take by mouth daily.    . citalopram (CELEXA) 10 MG  tablet Take 1 tablet (10 mg total) by mouth daily. 30 tablet 6  . Cod Liver Oil 1000 MG CAPS Take by mouth 3 (three) times a week.     . ferrous fumarate-b12-vitamic C-folic acid (TRINSICON / FOLTRIN) capsule Take 1 capsule by mouth 3 (three) times daily after meals. (Patient taking differently: Take 1 capsule by mouth 2 (two) times daily with a meal. ) 90 capsule 0  . furosemide (LASIX) 20 MG tablet Take 1 tablet (20 mg total) by mouth daily. And as needed. 45 tablet 3  . Glucosamine Sulfate 1000 MG CAPS Take by mouth daily.    . Multiple Vitamin (MULTIVITAMIN) tablet Take 1 tablet by mouth daily.    . pantoprazole (PROTONIX) 40 MG tablet Take 1 tablet (40 mg total) by mouth daily. 30 tablet 0  . psyllium (REGULOID) 0.52 g capsule Take 0.52 g by mouth 2 (two) times daily after a meal.    . amoxicillin (AMOXIL) 500 MG capsule Take 4 capsules (2,000 mg total) by mouth once as needed (Take 30-60 minutes before dental cleaning/procedure). (Patient not taking: Reported on 01/15/2016) 12 capsule 0  . enoxaparin (LOVENOX) 40 MG/0.4ML injection Inject 0.4 mLs (40 mg total) into the skin every 12 (twelve) hours. 10 Syringe 1  . potassium chloride SA (K-DUR,KLOR-CON) 20 MEQ tablet Take 1 tablet (20 mEq total) by mouth daily. 90 tablet 3  . warfarin (COUMADIN) 3 MG tablet Take 2 tablets (6 mg total) by mouth daily at 6 PM. Or as directed by LVAD Clinic.  180 tablet 3   No current facility-administered medications for this encounter.     Phenergan [promethazine hcl]; Lasix [furosemide]; and Morphine and related  REVIEW OF SYSTEMS: All systems negative except as listed in HPI, PMH and Problem list.    Vital Signs:  Doppler Pressure:90 Automatc BP: 102/65 (85)  HR: 90 SPO2: 98 %  Weight: 191 lb w/ eqt Last weight: 190 lb   Physical Exam: GENERAL: Well appearing, male who presents to clinic today in no acute distress. Ambulated in the clinic without difficulty HEENT: normal NECK: Supple, JVP  7-8,  2+ bilaterally, no bruits.  No lymphadenopathy or thyromegaly appreciated.  CARDIAC:  Mechanical heart sounds with LVAD hum present.  LUNGS:  Clear to auscultation bilaterally.  ABDOMEN:  Soft, round, nontender, positive bowel sounds x4.     LVAD exit site: well-healed and incorporated.   Stabilization device present and accurately applied.  Driveline dressing is being changed daily per sterile technique. EXTREMITIES:  Warm and dry, no cyanosis, clubbing, rash.  Trace edema  NEUROLOGIC:  Alert and oriented.     ASSESSMENT AND PLAN:   1. 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 EF 15-20%. S/p HM-II LVAD 08/30/15 NYHA II.  MAP stable.  VAD parameters stable.   - Volume status much improved with increased lasix. Continue current regimen. - Wear compression stockings during the day. - Continue cardiac rehab.  2. Frequent PVC: Off amiodarone, will follow.  3. Anticoagulation management: INR goal 2.0-3.0. Off ASA with epistaxis.  4. Dementia - Recently has seen Neurology. MSSE 16/30. They have started low-dose SSRI. If no improvement will consider Aricept.   Ethylene Reznick,MD 01/28/2016

## 2016-01-29 ENCOUNTER — Encounter (HOSPITAL_COMMUNITY)
Admission: RE | Admit: 2016-01-29 | Discharge: 2016-01-29 | Disposition: A | Discharge status: Home / Self Care | Payer: Medicare Other | Source: Ambulatory Visit | Attending: Internal Medicine | Admitting: Internal Medicine

## 2016-01-29 ENCOUNTER — Other Ambulatory Visit: Payer: Self-pay | Admitting: Internal Medicine

## 2016-01-29 ENCOUNTER — Ambulatory Visit (HOSPITAL_COMMUNITY): Payer: Self-pay | Admitting: Pharmacist

## 2016-01-29 DIAGNOSIS — Z95811 Presence of heart assist device: Secondary | ICD-10-CM | POA: Diagnosis not present

## 2016-01-29 LAB — PROTIME-INR: INR: 2.6 — AB (ref <=1.1)

## 2016-01-29 LAB — COAGUCHEK XS/INR WAIVED
INR: 2.6 — ABNORMAL HIGH (ref 0.9–1.1)
Prothrombin Time: 30.8 s

## 2016-01-29 NOTE — Progress Notes (Signed)
Daily Session Note  Patient Details  Name: TRAESON DUSZA MRN: [id number] Date of Birth: 08/06/1940 Referring Provider:   Flowsheet Row CARDIAC REHAB PHASE II ORIENTATION from 12/11/2015 in [place]  Referring Provider  Dr. Haroldine Laws      Encounter Date: 01/29/2016  Check In:     Session Check In - 01/29/16 0930      Check-In   Location [facility]   Staff Present Suzanne Boron, BS, EP, Exercise Physiologist;Seda Kronberg Wynetta Emery, RN, BSN   Supervising physician immediately available to respond to emergencies See telemetry face sheet for immediately available MD   Medication changes reported     No   Fall or balance concerns reported    No   Warm-up and Cool-down Performed as group-led instruction   Resistance Training Performed Yes   VAD Patient? Yes     VAD patient   Has back up controller? Yes   Has spare charged batteries? Yes   Has battery cables? Yes   Has compatible battery clips? Yes     Pain Assessment   Currently in Pain? No/denies   Pain Score 0-No pain   Multiple Pain Sites No      Capillary Blood Glucose: No results found for this or any previous visit (from the past 24 hour(s)).   Goals Met:  Independence with exercise equipment Exercise tolerated well No report of cardiac concerns or symptoms Strength training completed today  Goals Unmet:  Not Applicable  Comments: Check out 1030.   Dr. Kate Sable is Medical Director for [facility] Cardiac and Pulmonary Rehab.

## 2016-01-31 ENCOUNTER — Encounter (HOSPITAL_COMMUNITY): Payer: Medicare Other

## 2016-02-03 ENCOUNTER — Other Ambulatory Visit: Payer: Self-pay | Admitting: Internal Medicine

## 2016-02-03 ENCOUNTER — Encounter (HOSPITAL_COMMUNITY)
Admission: RE | Admit: 2016-02-03 | Discharge: 2016-02-03 | Disposition: A | Discharge status: Home / Self Care | Payer: Medicare Other | Source: Ambulatory Visit | Attending: Internal Medicine | Admitting: Internal Medicine

## 2016-02-03 ENCOUNTER — Ambulatory Visit (HOSPITAL_COMMUNITY): Payer: Self-pay | Admitting: Pharmacist

## 2016-02-03 DIAGNOSIS — Z95811 Presence of heart assist device: Secondary | ICD-10-CM

## 2016-02-03 LAB — COAGUCHEK XS/INR WAIVED
INR: 2.9 — AB (ref 0.9–1.1)
Prothrombin Time: 35 s

## 2016-02-03 LAB — PROTIME-INR: INR: 2.9 — AB (ref <=1.1)

## 2016-02-03 NOTE — Progress Notes (Signed)
Daily Session Note  Patient Details  Name: HAVIER DEEB MRN: [id number] Date of Birth: 1940-06-08 Referring Provider:   Flowsheet Row CARDIAC REHAB PHASE II ORIENTATION from 12/11/2015 in [place]  Referring Provider  Dr. Haroldine Laws      Encounter Date: 02/03/2016  Check In:     Session Check In - 02/03/16 0930      Check-In   Location [facility]   Staff Present Suzanne Boron, BS, EP, Exercise Physiologist;Anokhi Shannon Wynetta Emery, RN, BSN   Supervising physician immediately available to respond to emergencies See telemetry face sheet for immediately available MD   Medication changes reported     No   Fall or balance concerns reported    No   Warm-up and Cool-down Performed as group-led instruction   Resistance Training Performed Yes   VAD Patient? Yes     VAD patient   Has back up controller? Yes   Has spare charged batteries? Yes   Has battery cables? Yes   Has compatible battery clips? Yes     Pain Assessment   Currently in Pain? No/denies   Pain Score 0-No pain   Multiple Pain Sites No      Capillary Blood Glucose: No results found for this or any previous visit (from the past 24 hour(s)).   Goals Met:  Independence with exercise equipment Exercise tolerated well No report of cardiac concerns or symptoms Strength training completed today  Goals Unmet:  Not Applicable  Comments: Check out 1030.   Dr. Kate Sable is Medical Director for [facility] Cardiac and Pulmonary Rehab.

## 2016-02-05 ENCOUNTER — Encounter (HOSPITAL_COMMUNITY)
Admission: RE | Admit: 2016-02-05 | Discharge: 2016-02-05 | Disposition: A | Discharge status: Home / Self Care | Payer: Medicare Other | Source: Ambulatory Visit | Attending: Internal Medicine | Admitting: Internal Medicine

## 2016-02-05 DIAGNOSIS — Z95811 Presence of heart assist device: Secondary | ICD-10-CM

## 2016-02-05 NOTE — Progress Notes (Signed)
Daily Session Note  Patient Details  Name: GARYN ARLOTTA MRN: [id number] Date of Birth: 1940-08-14 Referring Provider:   Flowsheet Row CARDIAC REHAB PHASE II ORIENTATION from 12/11/2015 in [place]  Referring Provider  Dr. Haroldine Laws      Encounter Date: 02/05/2016  Check In:     Session Check In - 02/05/16 0930      Check-In   Location [facility]   Staff Present Suzanne Boron, BS, EP, Exercise Physiologist;Gwenivere Hiraldo Wynetta Emery, RN, BSN   Supervising physician immediately available to respond to emergencies See telemetry face sheet for immediately available MD   Medication changes reported     No   Fall or balance concerns reported    No   Warm-up and Cool-down Performed as group-led instruction   Resistance Training Performed Yes   VAD Patient? Yes     VAD patient   Has back up controller? Yes   Has spare charged batteries? Yes   Has battery cables? Yes   Has compatible battery clips? Yes     Pain Assessment   Currently in Pain? No/denies   Pain Score 0-No pain   Multiple Pain Sites No      Capillary Blood Glucose: No results found for this or any previous visit (from the past 24 hour(s)).   Goals Met:  Independence with exercise equipment Exercise tolerated well No report of cardiac concerns or symptoms Strength training completed today  Goals Unmet:  Not Applicable  Comments: Check out 1030.   Dr. Kate Sable is Medical Director for [facility] Cardiac and Pulmonary Rehab.

## 2016-02-07 ENCOUNTER — Encounter (HOSPITAL_COMMUNITY)
Admission: RE | Admit: 2016-02-07 | Discharge: 2016-02-07 | Disposition: A | Discharge status: Home / Self Care | Payer: Medicare Other | Source: Ambulatory Visit | Attending: Internal Medicine | Admitting: Internal Medicine

## 2016-02-07 DIAGNOSIS — M1990 Unspecified osteoarthritis, unspecified site: Secondary | ICD-10-CM | POA: Diagnosis not present

## 2016-02-07 DIAGNOSIS — Z95811 Presence of heart assist device: Secondary | ICD-10-CM | POA: Insufficient documentation

## 2016-02-07 DIAGNOSIS — K219 Gastro-esophageal reflux disease without esophagitis: Secondary | ICD-10-CM | POA: Diagnosis not present

## 2016-02-07 DIAGNOSIS — Z79899 Other long term (current) drug therapy: Secondary | ICD-10-CM | POA: Insufficient documentation

## 2016-02-07 DIAGNOSIS — Z7901 Long term (current) use of anticoagulants: Secondary | ICD-10-CM | POA: Insufficient documentation

## 2016-02-07 DIAGNOSIS — I5022 Chronic systolic (congestive) heart failure: Secondary | ICD-10-CM | POA: Insufficient documentation

## 2016-02-07 DIAGNOSIS — I428 Other cardiomyopathies: Secondary | ICD-10-CM | POA: Insufficient documentation

## 2016-02-07 NOTE — Progress Notes (Signed)
Daily Session Note  Patient Details  Name: [name] MRN: [id number] Date of Birth: 01-16-1941 Referring Provider:   Flowsheet Row CARDIAC REHAB PHASE II ORIENTATION from 12/11/2015 in [place]  Referring Provider  Dr. Haroldine Laws      Encounter Date: 02/07/2016  Check In:     Session Check In - 02/07/16 0930      Check-In   Location [facility]   Staff Present Suzanne Boron, BS, EP, Exercise Physiologist;Jenicka Coxe Wynetta Emery, RN, BSN   Supervising physician immediately available to respond to emergencies See telemetry face sheet for immediately available MD   Medication changes reported     No   Fall or balance concerns reported    No   Warm-up and Cool-down Performed as group-led instruction   Resistance Training Performed Yes   VAD Patient? Yes     VAD patient   Has back up controller? Yes   Has spare charged batteries? Yes   Has battery cables? Yes   Has compatible battery clips? Yes     Pain Assessment   Currently in Pain? No/denies   Pain Score 0-No pain   Multiple Pain Sites No      Capillary Blood Glucose: No results found for this or any previous visit (from the past 24 hour(s)).   Goals Met:  Independence with exercise equipment Exercise tolerated well No report of cardiac concerns or symptoms Strength training completed today  Goals Unmet:  Not Applicable  Comments: Check out 1030.   Dr. Kate Sable is Medical Director for [facility] Cardiac and Pulmonary Rehab.

## 2016-02-10 ENCOUNTER — Other Ambulatory Visit: Payer: Self-pay | Admitting: Internal Medicine

## 2016-02-10 ENCOUNTER — Ambulatory Visit (HOSPITAL_COMMUNITY): Payer: Self-pay | Admitting: Pharmacist

## 2016-02-10 ENCOUNTER — Encounter (HOSPITAL_COMMUNITY)
Admission: RE | Admit: 2016-02-10 | Discharge: 2016-02-10 | Disposition: A | Discharge status: Home / Self Care | Payer: Medicare Other | Source: Ambulatory Visit | Attending: Internal Medicine | Admitting: Internal Medicine

## 2016-02-10 DIAGNOSIS — Z95811 Presence of heart assist device: Secondary | ICD-10-CM | POA: Diagnosis not present

## 2016-02-10 LAB — COAGUCHEK XS/INR WAIVED
INR: 2.5 — AB (ref 0.9–1.1)
Prothrombin Time: 29.4 s

## 2016-02-10 LAB — PROTIME-INR: INR: 2.5 — AB (ref <=1.1)

## 2016-02-10 NOTE — Progress Notes (Signed)
Daily Session Note  Patient Details  Name: [name] MRN: [id number] Date of Birth: 12-14-1940 Referring Provider:   Flowsheet Row CARDIAC REHAB PHASE II ORIENTATION from 12/11/2015 in [place]  Referring Provider  Dr. Haroldine Laws      Encounter Date: 02/10/2016  Check In:     Session Check In - 02/10/16 0930      Check-In   Location [facility]   Staff Present Suzanne Boron, BS, EP, Exercise Physiologist;Kassady Laboy Wynetta Emery, RN, BSN   Supervising physician immediately available to respond to emergencies See telemetry face sheet for immediately available MD   Medication changes reported     No   Fall or balance concerns reported    No   Warm-up and Cool-down Performed as group-led instruction   Resistance Training Performed Yes   VAD Patient? Yes     VAD patient   Has back up controller? Yes   Has spare charged batteries? Yes   Has battery cables? Yes   Has compatible battery clips? Yes     Pain Assessment   Currently in Pain? No/denies   Pain Score 0-No pain   Multiple Pain Sites No      Capillary Blood Glucose: No results found for this or any previous visit (from the past 24 hour(s)).   Goals Met:  Independence with exercise equipment Exercise tolerated well No report of cardiac concerns or symptoms Strength training completed today  Goals Unmet:  Not Applicable  Comments: Check out 1030.   Dr. Kate Sable is Medical Director for [facility] Cardiac and Pulmonary Rehab.

## 2016-02-12 ENCOUNTER — Encounter (HOSPITAL_COMMUNITY)
Admission: RE | Admit: 2016-02-12 | Discharge: 2016-02-12 | Disposition: A | Discharge status: Home / Self Care | Payer: Medicare Other | Source: Ambulatory Visit | Attending: Internal Medicine | Admitting: Internal Medicine

## 2016-02-12 DIAGNOSIS — Z95811 Presence of heart assist device: Secondary | ICD-10-CM | POA: Diagnosis not present

## 2016-02-12 NOTE — Progress Notes (Signed)
Daily Session Note  Patient Details  Name: BASHEER MOLCHAN MRN: [id number] Date of Birth: 1941/02/09 Referring Provider:   Flowsheet Row CARDIAC REHAB PHASE II ORIENTATION from 12/11/2015 in [place]  Referring Provider  Dr. Haroldine Laws      Encounter Date: 02/12/2016  Check In:     Session Check In - 02/12/16 0930      Check-In   Location [facility]   Staff Present Suzanne Boron, BS, EP, Exercise Physiologist;Ashika Apuzzo Wynetta Emery, RN, BSN   Supervising physician immediately available to respond to emergencies See telemetry face sheet for immediately available MD   Medication changes reported     No   Fall or balance concerns reported    No   Warm-up and Cool-down Performed as group-led instruction   Resistance Training Performed Yes   VAD Patient? Yes     VAD patient   Has spare charged batteries? Yes   Has battery cables? Yes   Has compatible battery clips? Yes     Pain Assessment   Currently in Pain? No/denies   Pain Score 0-No pain   Multiple Pain Sites No      Capillary Blood Glucose: No results found for this or any previous visit (from the past 24 hour(s)).   Goals Met:  Independence with exercise equipment Exercise tolerated well No report of cardiac concerns or symptoms Strength training completed today  Goals Unmet:  Not Applicable  Comments: Check out 1030.   Dr. Kate Sable is Medical Director for [facility] Cardiac and Pulmonary Rehab.

## 2016-02-14 ENCOUNTER — Encounter (HOSPITAL_COMMUNITY)
Admission: RE | Admit: 2016-02-14 | Discharge: 2016-02-14 | Disposition: A | Discharge status: Home / Self Care | Payer: Medicare Other | Source: Ambulatory Visit | Attending: Internal Medicine | Admitting: Internal Medicine

## 2016-02-14 DIAGNOSIS — Z95811 Presence of heart assist device: Secondary | ICD-10-CM

## 2016-02-14 NOTE — Progress Notes (Signed)
Daily Session Note  Patient Details  Name: [name] MRN: [id number] Date of Birth: 12/04/1940 Referring Provider:   Flowsheet Row CARDIAC REHAB PHASE II ORIENTATION from 12/11/2015 in [place] CARDIAC REHABILITATION  Referring Provider  Dr. Bensimhon      Encounter Date: 02/14/2016  Check In:     Session Check In - 02/14/16 0930      Check-In   Location [facility]   Staff Present Gregory Cowan, BS, EP, Exercise Physiologist; , RN, BSN   Supervising physician immediately available to respond to emergencies See telemetry face sheet for immediately available MD   Medication changes reported     No   Fall or balance concerns reported    No   Warm-up and Cool-down Performed as group-led instruction   Resistance Training Performed Yes   VAD Patient? Yes     VAD patient   Has back up controller? Yes   Has spare charged batteries? Yes   Has battery cables? Yes   Has compatible battery clips? Yes     Pain Assessment   Currently in Pain? No/denies   Pain Score 0-No pain   Multiple Pain Sites No      Capillary Blood Glucose: No results found for this or any previous visit (from the past 24 hour(s)).      Exercise Prescription Changes - 02/13/16 1400      Exercise Review   Progression Yes     Response to Exercise   Blood Pressure (Admit) 92/76  MAP 78   Blood Pressure (Exercise) 140/109  MAP 110   Blood Pressure (Exit) 98/81  MAP 100   Heart Rate (Admit) 74 bpm   Heart Rate (Exercise) 114 bpm   Heart Rate (Exit) 77 bpm   Rating of Perceived Exertion (Exercise) 10   Duration Progress to 30 minutes of continuous aerobic without signs/symptoms of physical distress   Intensity Rest + 30     Progression   Progression Continue progressive overload as per policy without signs/symptoms or physical distress.     Resistance Training   Training Prescription Yes   Weight 2   Reps 10-12     Treadmill   MPH 0   Grade 0   Minutes  0   METs 0     NuStep   Level 3   Watts 27   Minutes 15   METs 3.6     Arm Ergometer   Level 2.3   Watts 24   Minutes 20   METs 3.3     Home Exercise Plan   Plans to continue exercise at Home   Frequency Add 2 additional days to program exercise sessions.     Goals Met:  Independence with exercise equipment Exercise tolerated well No report of cardiac concerns or symptoms Strength training completed today  Goals Unmet:  Not Applicable  Comments: Check out 1030.   Dr. Suresh Koneswaran is Medical Director for [place] Cardiac and Pulmonary Rehab. 

## 2016-02-17 ENCOUNTER — Encounter (HOSPITAL_COMMUNITY)
Admission: RE | Admit: 2016-02-17 | Discharge: 2016-02-17 | Disposition: A | Discharge status: Home / Self Care | Payer: Medicare Other | Source: Ambulatory Visit | Attending: Internal Medicine | Admitting: Internal Medicine

## 2016-02-17 DIAGNOSIS — Z95811 Presence of heart assist device: Secondary | ICD-10-CM | POA: Diagnosis not present

## 2016-02-17 NOTE — Progress Notes (Signed)
Daily Session Note  Patient Details  Name: [name] MRN: [id number] Date of Birth: [birth year] Referring Provider:   Flowsheet Row CARDIAC REHAB PHASE II ORIENTATION from 12/11/2015 in [place]  Referring Provider  Dr. Haroldine Laws      Encounter Date: 02/17/2016  Check In:     Session Check In - 02/17/16 0930      Check-In   Location [facility]   Staff Present Suzanne Boron, BS, EP, Exercise Physiologist;Haskel Dewalt Wynetta Emery, RN, BSN   Supervising physician immediately available to respond to emergencies See telemetry face sheet for immediately available MD   Medication changes reported     No   Fall or balance concerns reported    No   Warm-up and Cool-down Performed as group-led instruction   Resistance Training Performed Yes   VAD Patient? Yes     VAD patient   Has back up controller? Yes   Has spare charged batteries? Yes   Has battery cables? Yes   Has compatible battery clips? Yes     Pain Assessment   Currently in Pain? No/denies   Pain Score 0-No pain   Multiple Pain Sites No      Capillary Blood Glucose: No results found for this or any previous visit (from the past 24 hour(s)).   Goals Met:  Independence with exercise equipment Exercise tolerated well No report of cardiac concerns or symptoms Strength training completed today  Goals Unmet:  Not Applicable  Comments: Check out 1030.   Dr. Kate Sable is Medical Director for [facility] Cardiac and Pulmonary Rehab.

## 2016-02-17 NOTE — Progress Notes (Signed)
Cardiac Individual Treatment Plan  Patient Details  Name: [name] MRN: [id number] Date of Birth: 1940-08-05 Referring Provider:   Flowsheet Row CARDIAC REHAB PHASE II ORIENTATION from 12/11/2015 in [place]  Referring Provider  Dr. Haroldine Laws      Initial Encounter Date:  Flowsheet Row CARDIAC REHAB PHASE II ORIENTATION from 12/11/2015 in [place]  Date  12/11/15  Referring Provider  Dr. Haroldine Laws      Visit Diagnosis: LVAD (left ventricular assist device) present [facility])  Patient's Home Medications on Admission:  Current Outpatient Prescriptions:  .  acetaminophen (TYLENOL) 325 MG tablet, Take 2 tablets (650 mg total) by mouth every 4 (four) hours as needed for mild pain or moderate pain., Disp: , Rfl:  .  amoxicillin (AMOXIL) 500 MG capsule, Take 4 capsules (2,000 mg total) by mouth once as needed (Take 30-60 minutes before dental cleaning/procedure). (Patient not taking: Reported on 01/15/2016), Disp: 12 capsule, Rfl: 0 .  calcium carbonate (OSCAL) 1500 (600 Ca) MG TABS tablet, Take by mouth daily., Disp: , Rfl:  .  citalopram (CELEXA) 10 MG tablet, Take 1 tablet (10 mg total) by mouth daily., Disp: 30 tablet, Rfl: 6 .  Cod Liver Oil 1000 MG CAPS, Take by mouth 3 (three) times a week. , Disp: , Rfl:  .  enoxaparin (LOVENOX) 40 MG/0.4ML injection, Inject 0.4 mLs (40 mg total) into the skin every 12 (twelve) hours., Disp: 10 Syringe, Rfl: 1 .  ferrous [id number] C-folic acid (TRINSICON / FOLTRIN) capsule, Take 1 capsule by mouth 3 (three) times daily after meals. (Patient taking differently: Take 1 capsule by mouth 2 (two) times daily with a meal. ), Disp: 90 capsule, Rfl: 0 .  furosemide (LASIX) 20 MG tablet, Take 1 tablet (20 mg total) by mouth daily. And as needed., Disp: 45 tablet, Rfl: 3 .  Glucosamine Sulfate 1000 MG CAPS, Take by mouth daily., Disp: , Rfl:  .  Multiple Vitamin (MULTIVITAMIN) tablet, Take 1 tablet  by mouth daily., Disp: , Rfl:  .  pantoprazole (PROTONIX) 40 MG tablet, Take 1 tablet (40 mg total) by mouth daily., Disp: 30 tablet, Rfl: 0 .  potassium chloride SA (K-DUR,KLOR-CON) 20 MEQ tablet, Take 1 tablet (20 mEq total) by mouth daily., Disp: 90 tablet, Rfl: 3 .  psyllium (REGULOID) 0.52 g capsule, Take 0.52 g by mouth 2 (two) times daily after a meal., Disp: , Rfl:  .  warfarin (COUMADIN) 3 MG tablet, Take 2 tablets (6 mg total) by mouth daily at 6 PM. Or as directed by [facility] Clinic., Disp: 180 tablet, Rfl: 3  Past Medical History: Past Medical History:  Diagnosis Date  . Automatic implantable cardioverter-defibrillator in situ   . GERD (gastroesophageal reflux disease)   . H/O hiatal hernia   . Insomnia   . Nephrolithiasis   . Nonischemic cardiomyopathy ([place])    Cardiac catheterization 12/2011 with LVEF 45%, no significant obstructive CAD  . Osteoarthritis   . Pneumonia    Dec 2016    Tobacco Use: History  Smoking Status  . Never Smoker  Smokeless Tobacco  . 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 Cardiac and Pulmonary Rehab Latest Ref Rng & Units 09/16/2015 09/17/2015 09/18/2015 09/19/2015 11/11/2015   Cholestrol 0 - 200 mg/dL - - - - -   LDLCALC 0 - 99 mg/dL - - - - -   HDL >40 mg/dL - - - - -  Trlycerides <150 mg/dL - - - - -   Hemoglobin A1c 4.8 - 5.6 % - - - - -   PHART 7.350 - 7.450 - - - - -   PCO2ART 35.0 - 45.0 mmHg - - - - -   HCO3 20.0 - 24.0 mEq/L - - - - -   TCO2 0 - 100 mmol/L - - - - 25   ACIDBASEDEF 0.0 - 2.0 mmol/L - - - - -   O2SAT % 70.0 61.9 83.4 71.8 -      Capillary Blood Glucose: Lab Results  Component Value Date   GLUCAP 133 (H) 09/19/2015   GLUCAP 106 (H) 09/19/2015   GLUCAP 183 (H) 09/18/2015   GLUCAP 120 (H) 09/18/2015   GLUCAP 166 (H) 09/18/2015     Exercise Target Goals:    Exercise Program Goal: Individual exercise prescription set with THRR, safety & activity barriers. Participant demonstrates  ability to understand and report RPE using BORG scale, to self-measure pulse accurately, and to acknowledge the importance of the exercise prescription.  Exercise Prescription Goal: Starting with aerobic activity 30 plus minutes a day, 3 days per week for initial exercise prescription. Provide home exercise prescription and guidelines that participant acknowledges understanding prior to discharge.  Activity Barriers & Risk Stratification:     Activity Barriers & Cardiac Risk Stratification - 12/11/15 1530      Activity Barriers & Cardiac Risk Stratification   Activity Barriers None   Cardiac Risk Stratification High      6 Minute Walk:     6 Minute Walk    Row Name 12/12/15 0830         6 Minute Walk   Phase Initial     Distance 1200 feet     Walk Time 6 minutes     # of Rest Breaks 0     MPH 2.27     METS 2.74     RPE 11     Perceived Dyspnea  10     VO2 Peak 8.92     Resting HR 61 bpm     Resting BP 100/80     Max Ex. HR 128 bpm     Max Ex. BP 120/80     2 Minute Post BP 100/80        Initial Exercise Prescription:     Initial Exercise Prescription - 12/12/15 0800      Date of Initial Exercise RX and Referring Provider   Date 12/11/15   Referring Provider Dr. Haroldine Laws     Treadmill   MPH 1.3   Grade 0   Minutes 15   METs 1.9     NuStep   Level 2   Watts 18   Minutes 15   METs 2.3     Arm Ergometer   Level 2   Watts 15   Minutes 20   METs 2.5     Prescription Details   Frequency (times per week) 3   Duration Progress to 30 minutes of continuous aerobic without signs/symptoms of physical distress     Intensity   THRR REST +  30   THRR 40-80% of Max Heartrate 95-113-130   Ratings of Perceived Exertion 11-13     Progression   Progression Continue to progress workloads to maintain intensity without signs/symptoms of physical distress.     Resistance Training   Training Prescription Yes   Weight 1   Reps 10-12      Perform Capillary  Blood Glucose checks as needed.  Exercise Prescription Changes:      Exercise Prescription Changes    Row Name 12/17/15 1000 01/14/16 0800 02/13/16 1400         Exercise Review   Progression No Yes Yes       Response to Exercise   Blood Pressure (Admit) 91/75  MAP 98 94/71  MAP 82 92/76  MAP 78     Blood Pressure (Exercise) 123/91  MAP 99 110/61  MAP 90 140/109  MAP 110     Blood Pressure (Exit) 115/82  MAP 94 98/71  MAP 80 98/81  MAP 100     Heart Rate (Admit) 69 bpm 59 bpm 74 bpm     Heart Rate (Exercise) 91 bpm 96 bpm 114 bpm     Heart Rate (Exit) 75 bpm 68 bpm 77 bpm     Rating of Perceived Exertion (Exercise) '11 11 10     '$ Duration Progress to 30 minutes of continuous aerobic without signs/symptoms of physical distress Progress to 30 minutes of continuous aerobic without signs/symptoms of physical distress Progress to 30 minutes of continuous aerobic without signs/symptoms of physical distress     Intensity Rest + 30 Rest + 30 Rest + 30       Progression   Progression Continue progressive overload as per policy without signs/symptoms or physical distress. Continue progressive overload as per policy without signs/symptoms or physical distress. Continue progressive overload as per policy without signs/symptoms or physical distress.       Resistance Training   Training Prescription Yes Yes Yes     Weight '1 1 2     '$ Reps 10-12 10-12 10-12       Treadmill   MPH 0 0 0     Grade 0 0 0     Minutes 0 0 0     METs 0 0 0       NuStep   Level '2 2 3     '$ Watts '14 16 27     '$ Minutes '15 15 15     '$ METs 3.58 3.59 3.6       Arm Ergometer   Level 2 2.4 2.3     Watts '9 11 24     '$ Minutes '20 20 20     '$ METs 1.8 2.3 3.3       Home Exercise Plan   Plans to continue exercise at [facility]     Frequency Add 2 additional days to program exercise sessions. Add 2 additional days to program exercise sessions. Add 2 additional days to program exercise sessions.        Exercise  Comments:      Exercise Comments    Row Name 12/17/15 1040 01/14/16 0829 02/13/16 1446       Exercise Comments Patient has just started rehab and has some cognitive issues. Was not put on treadmill due to this reason as well.  Patient is progressing appropriately Patient is progressing appropriately          Discharge Exercise Prescription (Final Exercise Prescription Changes):     Exercise Prescription Changes - 02/13/16 1400      Exercise Review   Progression Yes     Response to Exercise   Blood Pressure (Admit) 92/76  MAP 78   Blood Pressure (Exercise) 140/109  MAP 110   Blood Pressure (Exit) 98/81  MAP 100   Heart Rate (Admit) 74 bpm   Heart Rate (Exercise) 114 bpm  Heart Rate (Exit) 77 bpm   Rating of Perceived Exertion (Exercise) 10   Duration Progress to 30 minutes of continuous aerobic without signs/symptoms of physical distress   Intensity Rest + 30     Progression   Progression Continue progressive overload as per policy without signs/symptoms or physical distress.     Resistance Training   Training Prescription Yes   Weight 2   Reps 10-12     Treadmill   MPH 0   Grade 0   Minutes 0   METs 0     NuStep   Level 3   Watts 27   Minutes 15   METs 3.6     Arm Ergometer   Level 2.3   Watts 24   Minutes 20   METs 3.3     Home Exercise Plan   Plans to continue exercise at Home   Frequency Add 2 additional days to program exercise sessions.      Nutrition:  Target Goals: Understanding of nutrition guidelines, daily intake of sodium '1500mg'$ , cholesterol '200mg'$ , calories 30% from fat and 7% or less from saturated fats, daily to have 5 or more servings of fruits and vegetables.  Biometrics:     Pre Biometrics - 12/12/15 0843      Pre Biometrics   Height '6\' 3"'$  (1.905 m)   Weight 187 lb 8 oz (85 kg)   Waist Circumference 38 inches   Hip Circumference 37 inches   Waist to Hip Ratio 1.03 %   BMI (Calculated) 23.5   Triceps Skinfold 9 mm    % Body Fat 22.6 %   Grip Strength 64.6 kg   Flexibility 0 in   Single Leg Stand 2 seconds       Nutrition Therapy Plan and Nutrition Goals:   Nutrition Discharge: Rate Your Plate Scores:     Nutrition Assessments - 12/11/15 1531      MEDFICTS Scores   Pre Score 82      Nutrition Goals Re-Evaluation:   Psychosocial: Target Goals: Acknowledge presence or absence of depression, maximize coping skills, provide positive support system. Participant is able to verbalize types and ability to use techniques and skills needed for reducing stress and depression.  Initial Review & Psychosocial Screening:     Initial Psych Review & Screening - 12/11/15 1532      Initial Review   Current issues with Current Sleep Concerns  Patient says he does feel down and frustrated at times d/t not being able to do the things he used to do like yard work but no depression.      Family Dynamics   Good Support System? Yes     Barriers   Psychosocial barriers to participate in program There are no identifiable barriers or psychosocial needs.     Screening Interventions   Interventions Encouraged to exercise      Quality of Life Scores:     Quality of Life - 12/12/15 0845      Quality of Life Scores   Health/Function Pre 29.5 %   Socioeconomic Pre 28.44 %   Psych/Spiritual Pre 27.86 %   Family Pre 30 %   GLOBAL Pre 29 %      PHQ-9: Recent Review Flowsheet Data    Depression screen [facility] 2/9 12/11/2015   Decreased Interest 0   Down, Depressed, Hopeless 1   PHQ - 2 Score 1   Altered sleeping 1   Tired, decreased energy 1   Change in appetite 0  Feeling bad or failure about yourself  0   Trouble concentrating 0   Moving slowly or fidgety/restless 0   Suicidal thoughts 0   PHQ-9 Score 3   Difficult doing work/chores Not difficult at all      Psychosocial Evaluation and Intervention:     Psychosocial Evaluation - 12/11/15 1534      Psychosocial Evaluation & Interventions    Interventions Relaxation education;Encouraged to exercise with the program and follow exercise prescription;Stress management education   Continued Psychosocial Services Needed No      Psychosocial Re-Evaluation:     Psychosocial Re-Evaluation    [place] Name 01/20/16 1356 02/17/16 1502           Psychosocial Re-Evaluation   Interventions Encouraged to attend Cardiac Rehabilitation for the exercise Encouraged to attend Cardiac Rehabilitation for the exercise      Comments Patient's QOL score was 29 and his PHQ-9 score was 3. No psychosocial issues identified.  Patient continues to have no psychosocial issues identified.       Continued Psychosocial Services Needed No No         Vocational Rehabilitation: Provide vocational rehab assistance to qualifying candidates.   Vocational Rehab Evaluation & Intervention:     Vocational Rehab - 12/11/15 1531      Initial Vocational Rehab Evaluation & Intervention   Assessment shows need for Vocational Rehabilitation No      Education: Education Goals: Education classes will be provided on a weekly basis, covering required topics. Participant will state understanding/return demonstration of topics presented.  Learning Barriers/Preferences:     Learning Barriers/Preferences - 12/11/15 1530      Learning Barriers/Preferences   Learning Barriers None   Learning Preferences Skilled Demonstration      Education Topics: Hypertension, Hypertension Reduction -Define heart disease and high blood pressure. Discus how high blood pressure affects the body and ways to reduce high blood pressure. Flowsheet Row CARDIAC REHAB PHASE II EXERCISE from 02/12/2016 in [place]  Date  01/08/16  Educator  DC  Instruction Review Code  2- meets goals/outcomes      Exercise and Your Heart -Discuss why it is important to exercise, the FITT principles of exercise, normal and abnormal responses to exercise, and how to exercise  safely. Flowsheet Row CARDIAC REHAB PHASE II EXERCISE from 02/12/2016 in [place]  Date  01/15/16  Educator  Russella Dar  Instruction Review Code  2- meets goals/outcomes      Angina -Discuss definition of angina, causes of angina, treatment of angina, and how to decrease risk of having angina. Flowsheet Row CARDIAC REHAB PHASE II EXERCISE from 02/12/2016 in [place]  Date  01/22/16  Educator  Russella Dar  Instruction Review Code  2- meets goals/outcomes      Cardiac Medications -Review what the following cardiac medications are used for, how they affect the body, and side effects that may occur when taking the medications.  Medications include Aspirin, Beta blockers, calcium channel blockers, ACE Inhibitors, angiotensin receptor blockers, diuretics, digoxin, and antihyperlipidemics. Flowsheet Row CARDIAC REHAB PHASE II EXERCISE from 02/12/2016 in [place]  Date  01/29/16  Educator  Russella Dar  Instruction Review Code  2- meets goals/outcomes      Congestive Heart Failure -Discuss the definition of CHF, how to live with CHF, the signs and symptoms of CHF, and how keep track of weight and sodium intake. Flowsheet Row CARDIAC REHAB PHASE II EXERCISE from 02/12/2016 in  [place]  Date  02/05/16  Educator  Lisabeth Register  Instruction Review Code  2- meets goals/outcomes      Heart Disease and Intimacy -Discus the effect sexual activity has on the heart, how changes occur during intimacy as we age, and safety during sexual activity. Flowsheet Row CARDIAC REHAB PHASE II EXERCISE from 02/12/2016 in [place]  Date  02/12/16  Educator  Russella Dar  Instruction Review Code  2- meets goals/outcomes      Smoking Cessation / COPD -Discuss different methods to quit smoking, the health benefits of quitting smoking, and the definition of COPD.   Nutrition I:  Fats -Discuss the types of cholesterol, what cholesterol does to the heart, and how cholesterol levels can be controlled.   Nutrition II: Labels -Discuss the different components of food labels and how to read food label   Heart Parts and Heart Disease -Discuss the anatomy of the heart, the pathway of blood circulation through the heart, and these are affected by heart disease.   Stress I: Signs and Symptoms -Discuss the causes of stress, how stress may lead to anxiety and depression, and ways to limit stress. Flowsheet Row CARDIAC REHAB PHASE II EXERCISE from 02/12/2016 in [place]  Date  12/18/15  Educator  Diane coad  Instruction Review Code  2- meets goals/outcomes      Stress II: Relaxation -Discuss different types of relaxation techniques to limit stress.   Warning Signs of Stroke / TIA -Discuss definition of a stroke, what the signs and symptoms are of a stroke, and how to identify when someone is having stroke.   Knowledge Questionnaire Score:     Knowledge Questionnaire Score - 12/11/15 1531      Knowledge Questionnaire Score   Pre Score 21/24      Core Components/Risk Factors/Patient Goals at Admission:     Personal Goals and Risk Factors at Admission - 12/12/15 0746      Core Components/Risk Factors/Patient Goals on Admission    Weight Management Weight Maintenance   Increase Strength and Stamina Yes   Intervention Provide advice, education, support and counseling about physical activity/exercise needs.;Develop an individualized exercise prescription for aerobic and resistive training based on initial evaluation findings, risk stratification, comorbidities and participant's personal goals.   Expected Outcomes Achievement of increased cardiorespiratory fitness and enhanced flexibility, muscular endurance and strength shown through measurements of functional capacity and personal statement of participant.   Heart Failure Yes    Intervention Provide a combined exercise and nutrition program that is supplemented with education, support and counseling about heart failure. Directed toward relieving symptoms such as shortness of breath, decreased exercise tolerance, and extremity edema.   Personal Goal Other Yes   Personal Goal Be able to do things on his own. Have a better attitude and get rid of fear of death.    Intervention Patient will attend program exercising 3 days/week and supplement with exercise at home 2 to 4 days/week.    Expected Outcomes Patient will complete the program and meet his above stated goals.       Core Components/Risk Factors/Patient Goals Review:      Goals and Risk Factor Review    Row Name 12/12/15 0752 01/20/16 1352 02/17/16 1500         Core Components/Risk Factors/Patient Goals Review   Personal Goals Review Weight Management/Obesity;Increase Strength and Stamina;Heart Failure;Other  Be able to do things on his own. Have a better attitude and  get rid of fear of death.  Weight Management/Obesity;Other  Be able to do things on his own; have a better attitude and get rid of fear of death.  Increase Strength and Stamina  Be able to do things on his own; have a better attitude.     Review  - Patient has had 16 sessions. He is progressing well in the program. His strength and stamina are increasing and he has improved balance. He has gained 8.5 lbs. He interacts well with staff and other patients in his class. His attitude toward his illness is improving.  Patient has had 27 sessions gaining 10.5 lbs since starting the program. He is continues to  progress well in the program with continued increased strength and stamina and improved balance.  He continues to interact well with staff and other patients in his class. His attitude toward his illness is improving.      Expected Outcomes  - Patient will complete program meeting his personal goals.  Patient will complete the program meeting his  personal goals.         Core Components/Risk Factors/Patient Goals at Discharge (Final Review):      Goals and Risk Factor Review - 02/17/16 1500      Core Components/Risk Factors/Patient Goals Review   Personal Goals Review Increase Strength and Stamina  Be able to do things on his own; have a better attitude.   Review Patient has had 27 sessions gaining 10.5 lbs since starting the program. He is continues to  progress well in the program with continued increased strength and stamina and improved balance.  He continues to interact well with staff and other patients in his class. His attitude toward his illness is improving.    Expected Outcomes Patient will complete the program meeting his personal goals.       ITP Comments:     ITP Comments    Row Name 12/12/15 1520 12/19/15 1308         ITP Comments Patient met with Registered Dietitian to discuss nutrition topics including: Heart healthty eating, heart health cooking and make smart choices when shopping; Portion control; weight management; and hydration. Patient attended a group session with the hospital chaplian called Family Structure to discuss and share how his recent cardiac diagnosis has effected his life.  Patient new to program. He has had 3 sessions. Will continue to monitor for progress.          Comments: ITP 30 Day REVIEW Patient doing well with the program. Will continue to monitor for progress.

## 2016-02-19 ENCOUNTER — Encounter (HOSPITAL_COMMUNITY)
Admission: RE | Admit: 2016-02-19 | Discharge: 2016-02-19 | Disposition: A | Discharge status: Home / Self Care | Payer: Medicare Other | Source: Ambulatory Visit | Attending: Internal Medicine | Admitting: Internal Medicine

## 2016-02-19 DIAGNOSIS — Z95811 Presence of heart assist device: Secondary | ICD-10-CM | POA: Diagnosis not present

## 2016-02-19 NOTE — Progress Notes (Signed)
Daily Session Note  Patient Details  Name: JAYMIN WALN MRN: [id number] Date of Birth: September 08, 1940 Referring Provider:   Flowsheet Row CARDIAC REHAB PHASE II ORIENTATION from 12/11/2015 in [place]  Referring Provider  Dr. Haroldine Laws      Encounter Date: 02/19/2016  Check In:     Session Check In - 02/19/16 0930      Check-In   Location [facility]   Staff Present Suzanne Boron, BS, EP, Exercise Physiologist;Ijanae Macapagal Wynetta Emery, RN, BSN   Supervising physician immediately available to respond to emergencies See telemetry face sheet for immediately available MD   Medication changes reported     No   Fall or balance concerns reported    No   Warm-up and Cool-down Performed as group-led instruction   Resistance Training Performed Yes   VAD Patient? Yes     VAD patient   Has back up controller? Yes   Has spare charged batteries? Yes   Has battery cables? Yes   Has compatible battery clips? Yes     Pain Assessment   Currently in Pain? No/denies   Pain Score 0-No pain   Multiple Pain Sites No      Capillary Blood Glucose: No results found for this or any previous visit (from the past 24 hour(s)).   Goals Met:  Independence with exercise equipment Exercise tolerated well No report of cardiac concerns or symptoms Strength training completed today  Goals Unmet:  Not Applicable  Comments: Check out 1030.   Dr. Kate Sable is Medical Director for [facility] Cardiac and Pulmonary Rehab.

## 2016-02-21 ENCOUNTER — Encounter (HOSPITAL_COMMUNITY)
Admission: RE | Admit: 2016-02-21 | Discharge: 2016-02-21 | Disposition: A | Discharge status: Home / Self Care | Payer: Medicare Other | Source: Ambulatory Visit | Attending: Internal Medicine | Admitting: Internal Medicine

## 2016-02-21 DIAGNOSIS — Z95811 Presence of heart assist device: Secondary | ICD-10-CM | POA: Diagnosis not present

## 2016-02-21 NOTE — Progress Notes (Signed)
Daily Session Note  Patient Details  Name: [name] MRN: [id number] Date of Birth: [birth year] Referring Provider:   Flowsheet Row CARDIAC REHAB PHASE II ORIENTATION from 12/11/2015 in [place]  Referring Provider  Dr. Haroldine Laws      Encounter Date: 02/21/2016  Check In:     Session Check In - 02/21/16 0930      Check-In   Location [facility]   Staff Present Suzanne Boron, BS, EP, Exercise Physiologist;Erikah Thumm Wynetta Emery, RN, BSN   Supervising physician immediately available to respond to emergencies See telemetry face sheet for immediately available MD   Medication changes reported     No   Fall or balance concerns reported    No   Warm-up and Cool-down Performed as group-led instruction   Resistance Training Performed Yes   VAD Patient? Yes     VAD patient   Has back up controller? Yes   Has spare charged batteries? Yes   Has battery cables? Yes   Has compatible battery clips? Yes     Pain Assessment   Currently in Pain? No/denies   Pain Score 0-No pain   Multiple Pain Sites No      Capillary Blood Glucose: No results found for this or any previous visit (from the past 24 hour(s)).   Goals Met:  Independence with exercise equipment Exercise tolerated well No report of cardiac concerns or symptoms Strength training completed today  Goals Unmet:  Not Applicable  Comments: Check out 1030.   Dr. Kate Sable is Medical Director for [facility] Cardiac and Pulmonary Rehab.

## 2016-02-24 ENCOUNTER — Encounter (HOSPITAL_COMMUNITY)
Admission: RE | Admit: 2016-02-24 | Discharge: 2016-02-24 | Disposition: A | Discharge status: Home / Self Care | Payer: Medicare Other | Source: Ambulatory Visit | Attending: Internal Medicine | Admitting: Internal Medicine

## 2016-02-24 DIAGNOSIS — Z95811 Presence of heart assist device: Secondary | ICD-10-CM

## 2016-02-24 NOTE — Progress Notes (Signed)
Daily Session Note  Patient Details  Name: [name] MRN: [id number] Date of Birth: 18-Oct-1940 Referring Provider:   Flowsheet Row CARDIAC REHAB PHASE II ORIENTATION from 12/11/2015 in [place]  Referring Provider  Dr. Haroldine Laws      Encounter Date: 02/24/2016  Check In:     Session Check In - 02/24/16 0930      Check-In   Location [facility]   Staff Present Suzanne Boron, BS, EP, Exercise Physiologist;Billiejean Schimek Wynetta Emery, RN, BSN   Supervising physician immediately available to respond to emergencies See telemetry face sheet for immediately available MD   Medication changes reported     No   Fall or balance concerns reported    No   Warm-up and Cool-down Performed as group-led instruction   Resistance Training Performed Yes   VAD Patient? Yes     VAD patient   Has back up controller? Yes   Has spare charged batteries? Yes   Has battery cables? Yes   Has compatible battery clips? Yes     Pain Assessment   Currently in Pain? No/denies   Pain Score 0-No pain   Multiple Pain Sites No      Capillary Blood Glucose: No results found for this or any previous visit (from the past 24 hour(s)).   Goals Met:  Independence with exercise equipment Exercise tolerated well No report of cardiac concerns or symptoms Strength training completed today  Goals Unmet:  Not Applicable  Comments: Check out 1030.   Dr. Kate Sable is Medical Director for [facility].

## 2016-02-26 ENCOUNTER — Telehealth (HOSPITAL_COMMUNITY): Payer: Self-pay | Admitting: Infectious Diseases

## 2016-02-26 ENCOUNTER — Encounter (HOSPITAL_COMMUNITY)
Admission: RE | Admit: 2016-02-26 | Discharge: 2016-02-26 | Disposition: A | Discharge status: Home / Self Care | Payer: Medicare Other | Source: Ambulatory Visit | Attending: Internal Medicine | Admitting: Internal Medicine

## 2016-02-26 DIAGNOSIS — Z95811 Presence of heart assist device: Secondary | ICD-10-CM

## 2016-02-26 NOTE — Telephone Encounter (Signed)
Called to inform patient's wife to bring MPU to clinic tomorrow to change out batteries in the unit. Will also be doing tomorrow with 36m INTERMACS upkeep including equipment and emergency planning review.    [name], RN VAD Coordinator   Office: [phone] 24/7 Emergency VAD Pager: [phone]

## 2016-02-26 NOTE — Progress Notes (Signed)
Daily Session Note  Patient Details  Name: MILAM ALLBAUGH MRN: [id number] Date of Birth: 1941-03-02 Referring Provider:   Flowsheet Row CARDIAC REHAB PHASE II ORIENTATION from 12/11/2015 in [place]  Referring Provider  Dr. Haroldine Laws      Encounter Date: 02/26/2016  Check In:     Session Check In - 02/26/16 0930      Check-In   Location [facility]   Staff Present Diane Angelina Pih, MS, EP, [facility], Exercise Physiologist;Gregory Luther Parody, BS, EP, Exercise Physiologist;Rachelanne Whidby Wynetta Emery, RN, BSN   Supervising physician immediately available to respond to emergencies See telemetry face sheet for immediately available MD   Medication changes reported     No   Fall or balance concerns reported    No   Warm-up and Cool-down Performed as group-led instruction   Resistance Training Performed Yes   VAD Patient? Yes     VAD patient   Has back up controller? Yes   Has spare charged batteries? Yes   Has battery cables? Yes   Has compatible battery clips? Yes     Pain Assessment   Currently in Pain? No/denies   Pain Score 0-No pain   Multiple Pain Sites No      Capillary Blood Glucose: No results found for this or any previous visit (from the past 24 hour(s)).   Goals Met:  Independence with exercise equipment Exercise tolerated well No report of cardiac concerns or symptoms Strength training completed today  Goals Unmet:  Not Applicable  Comments: Check out 1030.   Dr. Kate Sable is Medical Director for [facility] Cardiac and Pulmonary Rehab.

## 2016-02-27 ENCOUNTER — Ambulatory Visit (HOSPITAL_COMMUNITY)
Admission: RE | Admit: 2016-02-27 | Discharge: 2016-02-27 | Disposition: A | Discharge status: Home / Self Care | Payer: Medicare Other | Source: Ambulatory Visit | Attending: Internal Medicine | Admitting: Internal Medicine

## 2016-02-27 ENCOUNTER — Encounter: Payer: Self-pay | Admitting: Internal Medicine

## 2016-02-27 ENCOUNTER — Ambulatory Visit (INDEPENDENT_AMBULATORY_CARE_PROVIDER_SITE_OTHER): Payer: Medicare Other | Admitting: Internal Medicine

## 2016-02-27 ENCOUNTER — Ambulatory Visit (HOSPITAL_COMMUNITY): Payer: Self-pay | Admitting: Pharmacist

## 2016-02-27 ENCOUNTER — Encounter (INDEPENDENT_AMBULATORY_CARE_PROVIDER_SITE_OTHER): Payer: Self-pay

## 2016-02-27 VITALS — HR 89 | Ht 74.0 in | Wt 197.8 lb

## 2016-02-27 DIAGNOSIS — I5022 Chronic systolic (congestive) heart failure: Secondary | ICD-10-CM | POA: Insufficient documentation

## 2016-02-27 DIAGNOSIS — Z95810 Presence of automatic (implantable) cardiac defibrillator: Secondary | ICD-10-CM | POA: Diagnosis not present

## 2016-02-27 DIAGNOSIS — I429 Cardiomyopathy, unspecified: Secondary | ICD-10-CM | POA: Diagnosis not present

## 2016-02-27 DIAGNOSIS — I447 Left bundle-branch block, unspecified: Secondary | ICD-10-CM | POA: Diagnosis not present

## 2016-02-27 DIAGNOSIS — I638 Other cerebral infarction: Secondary | ICD-10-CM

## 2016-02-27 DIAGNOSIS — Z7901 Long term (current) use of anticoagulants: Secondary | ICD-10-CM

## 2016-02-27 DIAGNOSIS — Z95811 Presence of heart assist device: Secondary | ICD-10-CM | POA: Diagnosis not present

## 2016-02-27 LAB — COMPREHENSIVE METABOLIC PANEL
ALT: 20 U/L (ref 17–63)
ANION GAP: 6 (ref 5–15)
AST: 32 U/L (ref 15–41)
Albumin: 3.5 g/dL (ref 3.5–5.0)
Alkaline Phosphatase: 65 U/L (ref 38–126)
BILIRUBIN TOTAL: 1.4 mg/dL — AB (ref 0.3–1.2)
BUN: 9 mg/dL (ref 6–20)
CO2: 28 mmol/L (ref 22–32)
Calcium: 9.1 mg/dL (ref 8.9–10.3)
Chloride: 107 mmol/L (ref 101–111)
Creatinine, Ser: 0.98 mg/dL (ref 0.61–1.24)
Glucose, Bld: 106 mg/dL — ABNORMAL HIGH (ref 65–99)
POTASSIUM: 4 mmol/L (ref 3.5–5.1)
Sodium: 141 mmol/L (ref 135–145)
TOTAL PROTEIN: 6.3 g/dL — AB (ref 6.5–8.1)

## 2016-02-27 LAB — CBC
HCT: 36.3 % — ABNORMAL LOW (ref 39.0–52.0)
Hemoglobin: 11 g/dL — ABNORMAL LOW (ref 13.0–17.0)
MCH: 25.9 pg — AB (ref 26.0–34.0)
MCHC: 30.3 g/dL (ref 30.0–36.0)
MCV: 85.6 fL (ref 78.0–100.0)
Platelets: 79 10*3/uL — ABNORMAL LOW (ref 150–400)
RBC: 4.24 MIL/uL (ref 4.22–5.81)
RDW: 17.8 % — AB (ref 11.5–15.5)
WBC: 4.2 10*3/uL (ref 4.0–10.5)

## 2016-02-27 LAB — TSH: TSH: 1.087 u[IU]/mL (ref 0.350–4.500)

## 2016-02-27 LAB — PROTIME-INR
INR: 2.11
PROTHROMBIN TIME: 24 s — AB (ref 11.4–15.2)

## 2016-02-27 LAB — BRAIN NATRIURETIC PEPTIDE: B NATRIURETIC PEPTIDE 5: 400.4 pg/mL — AB (ref 0.0–100.0)

## 2016-02-27 LAB — T4, FREE: Free T4: 1.1 ng/dL (ref 0.61–1.12)

## 2016-02-27 LAB — LACTATE DEHYDROGENASE: LDH: 250 U/L — ABNORMAL HIGH (ref 98–192)

## 2016-02-27 MED ORDER — FUROSEMIDE 20 MG PO TABS: 40.0000 mg | ORAL_TABLET | Freq: Every day | ORAL | 3 refills | Status: DC

## 2016-02-27 MED ORDER — POTASSIUM CHLORIDE CRYS ER 20 MEQ PO TBCR: 40.0000 meq | EXTENDED_RELEASE_TABLET | Freq: Every day | ORAL | 3 refills | Status: AC

## 2016-02-27 NOTE — Patient Instructions (Signed)
Medication Instructions: - Your physician recommends that you continue on your current medications as directed. Please refer to the Current Medication list given to you today.  Labwork: - none ordered  Procedures/Testing: - none ordered  Follow-Up: - Remote monitoring is used to monitor your Pacemaker of ICD from home. This monitoring reduces the number of office visits required to check your device to one time per year. It allows [place] to keep an eye on the functioning of your device to ensure it is working properly. You are scheduled for a device check from home on 05/28/16. You may send your transmission at any time that day. If you have a wireless device, the transmission will be sent automatically. After your physician reviews your transmission, you will receive a postcard with your next transmission date.  - Your physician wants you to follow-up in: 1 year with Dr. Graciela Husbands. You will receive a reminder letter in the mail two months in advance. If you don't receive a letter, please call our office to schedule the follow-up appointment.  Any Additional Special Instructions Will Be Listed Below (If Applicable).     If you need a refill on your cardiac medications before your next appointment, please call your pharmacy.  s

## 2016-02-27 NOTE — Patient Instructions (Addendum)
Wear compression stockings if you can tolerate it.   Increase your lasix to 40 mg (2 pills) everyday and Potassium (2 pills) everyday.     Back to see [place] in 6 weeks.   Monday January 8th go to [facility] for your INR and your Automotive engineer.

## 2016-02-27 NOTE — Progress Notes (Signed)
Patient presents for 6 week follow up in VAD Clinic today. Reports no problems with VAD equipment or concerns with drive line.   Vital Signs:  Doppler Pressure: 100 Automatc BP: 109/59 (86)  HR:  SPO2: 98 %  Weight: 198.2 lb w/ eqt Last weight: 191  Lb Home Weights: 189 lb. (up 4 lbs since last visit)   VAD Indication: Destination [place] - age excluding.   VAD interrogation & Equipment Management: Speed: 9200 Flow: 6.2 Power: 6.2 w    PI: 6.0  Alarms: no clinical alarms, few low volt advisories Events: rare  Fixed speed: 9200 Low speed limit: 8600  Primary Controller:  Replace back up battery in 24 months. Back up controller:   Replace back up battery in 24 months.  *Annual Equipment Maintenance on UBC/PM was performed on 09/2015.  *MPU batteries changed 02/27/2016 > due next 08/2016  I reviewed the LVAD parameters from today and compared the results to the patient's prior recorded data and reviewed with Dr. Gala Romney. LVAD interrogation was NEGATIVE for significant power changes, NEGATIVE for clinical alarms and STABLE for PI events/speed drops. No programming changes were made and pump is functioning within specified parameters. Pt is performing daily controller and system monitor self tests along with completing weekly and monthly maintenance for LVAD equipment.  LVAD equipment check completed and is in good working order. Back-up equipment present. Charged back up battery and performed self-test on equipment.   Exit Site Care: Drive line is being maintained weekly by wife Carney Bern. Existing VAD dressing removed and site care performed using sterile technique. Drive line exit site cleaned with STERILE SALINE ONLY and allowed to dry. Sorbaview dressing with bio patch applied. Exit site healed and incorporated, the velour is fully implanted at exit site. No redness, tenderness, drainage, foul odor or rash noted. Drive line anchor re-applied. Pt denies fever or chills. Pt  provided with 8 weekly kits today.   Significant Events on VAD Support:  11/2015 >> Nosebleeds requiring packing x 2 (off ASA)  Device: Medtronic CRT-D Therapies: VF On VT Monitor Last check: 02/2015  BP & Labs:  MAP 86 - Doppler is reflecting MAP  Hgb 11.0  - No S/S of bleeding. Specifically denies melena/BRBPR or nosebleeds.  LDH slightly higher at 250 with established baseline of 190 - 230. Denies tea-colored urine. No power elevations noted on interrogation.   6 Month VAD Review:   Reviewed and demonstrated the following to patient and caregiver:               Reviewed the steps for replacing the running system controller with the back-up system controller (see patient handbook section 2 or the appropriate pamphlet).             X  Demonstrated (using the mock-driveline and controller) how to connect and disconnect the mock-driveline in back up system controller in a timely manner (less than 10 seconds) with return demonstration by patient and caregiver.              X   Reviewed system controller alarms and troubleshooting including hazard and advisory alarms and accessing alarm history. Re-enforced NOT TO attempt to perform any task displayed on the display screen alone or without calling the VAD pager [phone] (see patient handbook section 5 or the appropriate pamphlet).                       X  Reviewed how to handle an emergency including when  the pump is running and when the pump has stopped (see patient handbook section 8). Call 911 first and then the VAD pager at [phone].             X   Reviewed 14-volt lithium ion battery calibration steps (see patient handbook section 3).            X   Reviewed contents of black bag and what must be with patient at all times.            X  Reviewed driveline exit site including cleansing, dressing, and immobilizing with an anchor device to prevent exit site trauma.             X  Reviewed weekly maintenance  which includes: Reviewing "Replacing the Running System Controller with a Ball CorporationBackup Controller" pamphlet. Clean batteries, clips, and battery charger contacts.  Check cables for damages. Rotate batteries; keep all 8 charged.              X  Reviewed monthly maintenance which includes: Reviewing Alarms and Troubleshooting. Check battery manufacturer dates. Check use/charge cycles for each battery; remember to re-calibrate every 70 uses when prompted.              X  Additional pamphlets Guide to Replacing the Running System Controller with the Backup Controller for Patients and Their Caregivers and HM II Alarms for Patients and Their Caregivers given to patient.              X   The patient was not able to verbalize his equipment and is still occasionally under the impression it is temporary per his wife. It took a lot of instruction during the visit today to help him switch from PM to batteries. His wife verbalizes that he must be with someone 24/7 for supervision and equipment maintenance.   Backup system controller 11 volt battery charged during visit.   6 month Intermacs follow up completed including:  Quality of Life, Post-Implant and KCCQ-12 he refused to complete. Neurocognitive trail making not done correctly.   Pt completed 1150 feet during 6 minute walk. Tolerated well.   Rexene AlbertsStephanie Dixon, RN VAD Coordinator   Office: [phone] 24/7 Emergency VAD Pager: [phone]

## 2016-02-27 NOTE — Progress Notes (Signed)
Patient Care Team: Richardean Chimera, MD as PCP - General (Unknown Physician Specialty)   HPI  [name] is a 75 y.o. 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    This is been miserable time for him. He lives inferior of the device stopping. He is struggling to accommodate to the bulk of the equipment. He is depressed and tearful.  His ability to work considerably less than it has been. He finds instructions STRUGGLING to ask for help   ECG 10/14> 25%      Past Medical History:  Diagnosis Date  . Automatic implantable cardioverter-defibrillator in situ   . GERD (gastroesophageal reflux disease)   . H/O hiatal hernia   . Insomnia   . Nephrolithiasis   . Nonischemic cardiomyopathy (HCC)    Cardiac catheterization 12/2011 with LVEF 45%, no significant obstructive CAD  . Osteoarthritis   . Pneumonia    Dec 2016    Past Surgical History:  Procedure Laterality Date  . APPENDECTOMY    . BI-VENTRICULAR IMPLANTABLE CARDIOVERTER DEFIBRILLATOR N/A 12/14/2012   Procedure: BI-VENTRICULAR IMPLANTABLE CARDIOVERTER DEFIBRILLATOR  (CRT-D);  Surgeon: Duke Salvia, MD;  Location: [facility] CATH LAB;  Service: Cardiovascular;  Laterality: N/A;  . BI-VENTRICULAR IMPLANTABLE CARDIOVERTER DEFIBRILLATOR  (CRT-D)  12/14/2012  . CARDIAC CATHETERIZATION  2003?; ~ 11/2012  . CARDIAC CATHETERIZATION N/A 08/22/2015   Procedure: Right/Left Heart Cath and Coronary Angiography;  Surgeon: Dolores Patty, MD;  Location: [facility] INVASIVE CV LAB;  Service: Cardiovascular;  Laterality: N/A;  . CATARACT EXTRACTION W/PHACO Left 05/13/2015   Procedure: CATARACT EXTRACTION PHACO AND INTRAOCULAR LENS PLACEMENT LEFT EYE;  CDE:  3.49;  Surgeon: Susa Simmonds, MD;  Location: [facility] ORS;  Service: Ophthalmology;   Laterality: Left;  . CATARACT EXTRACTION W/PHACO Right 07/22/2015   Procedure: CATARACT EXTRACTION PHACO AND INTRAOCULAR LENS PLACEMENT RIGHT EYE CDE=4.50;  Surgeon: Susa Simmonds, MD;  Location: [facility] ORS;  Service: Ophthalmology;  Laterality: Right;  . CYSTOSCOPY W/ STONE MANIPULATION     "3-4 times" (12/14/2012)  . EXPLORATORY LAPAROTOMY  2006?  . INSERTION OF IMPLANTABLE LEFT VENTRICULAR ASSIST DEVICE N/A 08/30/2015   Procedure: INSERTION OF IMPLANTABLE LEFT VENTRICULAR ASSIST DEVICE;  Surgeon: Kerin Perna, MD;  Location: [facility] OR;  Service: Open Heart Surgery;  Laterality: N/A;  . INTRAOPERATIVE TRANSESOPHAGEAL ECHOCARDIOGRAM N/A 08/30/2015   Procedure: INTRAOPERATIVE TRANSESOPHAGEAL ECHOCARDIOGRAM;  Surgeon: Kerin Perna, MD;  Location: [facility] OR;  Service: Open Heart Surgery;  Laterality: N/A;  . KNEE ARTHROSCOPY Left 1990's  . LITHOTRIPSY     "twice" (12/14/2012)  . POSTERIOR FUSION CERVICAL SPINE  2001  . VOLVULUS REDUCTION  2007    Current Outpatient Prescriptions  Medication Sig Dispense Refill  . acetaminophen (TYLENOL) 325 MG tablet Take 2 tablets (650 mg total) by mouth every 4 (four) hours as needed for mild pain or moderate pain.    Marland Kitchen amoxicillin (AMOXIL) 500 MG capsule Take 4 capsules (2,000 mg total) by mouth once as needed (Take 30-60 minutes before dental cleaning/procedure). (Patient not taking: Reported on 02/27/2016) 12 capsule 0  . calcium carbonate (OSCAL) 1500 (600 Ca) MG TABS tablet Take by mouth daily.    Marland Kitchen Cod Liver Oil 1000 MG CAPS Take by mouth 3 (three) times a week.     . ferrous fumarate-b12-vitamic C-folic  acid (TRINSICON / FOLTRIN) capsule Take 1 capsule by mouth 3 (three) times daily after meals. (Patient taking differently: Take 1 capsule by mouth daily with breakfast. ) 90 capsule 0  . furosemide (LASIX) 20 MG tablet Take 2 tablets (40 mg total) by mouth daily. And as needed. 60 tablet 3  . Glucosamine Sulfate 1000 MG CAPS Take by mouth daily.    . Multiple  Vitamin (MULTIVITAMIN) tablet Take 1 tablet by mouth daily.    . pantoprazole (PROTONIX) 40 MG tablet Take 1 tablet (40 mg total) by mouth daily. 30 tablet 0  . potassium chloride SA (K-DUR,KLOR-CON) 20 MEQ tablet Take 2 tablets (40 mEq total) by mouth daily. 90 tablet 3  . warfarin (COUMADIN) 3 MG tablet Take 2 tablets (6 mg total) by mouth daily at 6 PM. Or as directed by LVAD Clinic. 180 tablet 3   No current facility-administered medications for this visit.     Allergies  Allergen Reactions  . Phenergan [Promethazine Hcl] Nausea And Vomiting  . Lasix [Furosemide] Other (See Comments)    "Dropped blood pressure too low"  . Morphine And Related Other (See Comments)    Goes crazy     Review of Systems negative except from HPI and PMH  Physical Exam Pulse 89   Ht 6\' 2"  (1.88 m)   Wt 197 lb 12.8 oz (89.7 kg)   SpO2 98%   BMI 25.40 kg/m  Well developed and well nourished in no acute distress HENT normal E scleral and icterus clear Neck Supple JVP 8-9t; carotids brisk and full Clear to ausculation Device pocket well healed; without hematoma or erythema.Cardiac hum  There is no tethering Regular rate and rhythm, no murmurs gallops or rub Soft with active bowel sounds No clubbing cyanosis no  Edema Alert and oriented, grossly normal motor and sensory function Skin Warm and Dry Affect flat and tearful     Assessment and  Plan  Familial cardiomyopathy  Congestive heart failure-chronic systolic  Left bundle branch block  CRT-D-Medtronic  Symptomatic bradycardia  Ventricular ectopy-frequent  Monitoring of high risk meds  Depression   Tachyarrhythmia issues are stable. Device function is normal. We spent almost an hour talking about the struggles that he is having with his LVAD. This included issues of helplessness, fear of device mechanical failure, loss of functional capacity, accommodating to the equipment.  We discussed LVAD support. Suggested he consider  antidepressant therapy and counseling.  More than 50% of 45 min was spent in counseling related to the above

## 2016-02-28 ENCOUNTER — Encounter (HOSPITAL_COMMUNITY)
Admission: RE | Admit: 2016-02-28 | Discharge: 2016-02-28 | Disposition: A | Discharge status: Home / Self Care | Payer: Medicare Other | Source: Ambulatory Visit | Attending: Internal Medicine | Admitting: Internal Medicine

## 2016-02-28 DIAGNOSIS — Z95811 Presence of heart assist device: Secondary | ICD-10-CM | POA: Diagnosis not present

## 2016-02-28 NOTE — Progress Notes (Signed)
Daily Session Note  Patient Details  Name: [name] MRN: [id number] Date of Birth: Oct 15, 1940 Referring Provider:   Flowsheet Row CARDIAC REHAB PHASE II ORIENTATION from 12/11/2015 in [place]  Referring Provider  Dr. Haroldine Laws      Encounter Date: 02/28/2016  Check In:     Session Check In - 02/28/16 0930      Check-In   Location [facility]   Staff Present Suzanne Boron, BS, EP, Exercise Physiologist;Marabelle Cushman Wynetta Emery, RN, BSN   Supervising physician immediately available to respond to emergencies See telemetry face sheet for immediately available MD   Medication changes reported     No   Fall or balance concerns reported    No   Warm-up and Cool-down Performed as group-led instruction   Resistance Training Performed Yes   VAD Patient? Yes     VAD patient   Has back up controller? Yes   Has spare charged batteries? Yes   Has battery cables? Yes   Has compatible battery clips? Yes     Pain Assessment   Currently in Pain? No/denies   Pain Score 0-No pain   Multiple Pain Sites No      Capillary Blood Glucose: Results for orders placed or performed during the hospital encounter of 02/27/16 (from the past 24 hour(s))  Brain natriuretic peptide     Status: Abnormal   Collection Time: 02/27/16 11:59 AM  Result Value Ref Range   B Natriuretic Peptide 400.4 (H) 0.0 - 100.0 pg/mL  Comprehensive metabolic panel     Status: Abnormal   Collection Time: 02/27/16 11:59 AM  Result Value Ref Range   Sodium 141 135 - 145 mmol/L   Potassium 4.0 3.5 - 5.1 mmol/L   Chloride 107 101 - 111 mmol/L   CO2 28 22 - 32 mmol/L   Glucose, Bld 106 (H) 65 - 99 mg/dL   BUN 9 6 - 20 mg/dL   Creatinine, Ser 0.98 0.61 - 1.24 mg/dL   Calcium 9.1 8.9 - 10.3 mg/dL   Total Protein 6.3 (L) 6.5 - 8.1 g/dL   Albumin 3.5 3.5 - 5.0 g/dL   AST 32 15 - 41 U/L   ALT 20 17 - 63 U/L   Alkaline Phosphatase 65 38 - 126 U/L   Total Bilirubin 1.4 (H) 0.3 - 1.2  mg/dL   GFR calc non Af Amer >60 >60 mL/min   GFR calc Af Amer >60 >60 mL/min   Anion gap 6 5 - 15  CBC     Status: Abnormal   Collection Time: 02/27/16 11:59 AM  Result Value Ref Range   WBC 4.2 4.0 - 10.5 K/uL   RBC 4.24 4.22 - 5.81 MIL/uL   Hemoglobin 11.0 (L) 13.0 - 17.0 g/dL   HCT 36.3 (L) 39.0 - 52.0 %   MCV 85.6 78.0 - 100.0 fL   MCH 25.9 (L) 26.0 - 34.0 pg   MCHC 30.3 30.0 - 36.0 g/dL   RDW 17.8 (H) 11.5 - 15.5 %   Platelets 79 (L) 150 - 400 K/uL  Protime-INR     Status: Abnormal   Collection Time: 02/27/16 11:59 AM  Result Value Ref Range   Prothrombin Time 24.0 (H) 11.4 - 15.2 seconds   INR 2.11   Lactate dehydrogenase     Status: Abnormal   Collection Time: 02/27/16 11:59 AM  Result Value Ref Range   LDH 250 (H) 98 - 192 U/L  T4, free  Status: None   Collection Time: 02/27/16 11:59 AM  Result Value Ref Range   Free T4 1.10 0.61 - 1.12 ng/dL  TSH     Status: None   Collection Time: 02/27/16 11:59 AM  Result Value Ref Range   TSH 1.087 0.350 - 4.500 uIU/mL     Goals Met:  Independence with exercise equipment Exercise tolerated well No report of cardiac concerns or symptoms Strength training completed today  Goals Unmet:  Not Applicable  Comments: Check out 1030.   Dr. Kate Sable is Medical Director for [facility] Cardiac and Pulmonary Rehab.

## 2016-03-02 ENCOUNTER — Encounter (HOSPITAL_COMMUNITY): Payer: Medicare Other

## 2016-03-02 NOTE — Progress Notes (Signed)
LVAD CLINIC NOTE  HPI: Mr. 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   Post-op course c/b delirium/sundowning, urinary retention and deconditioning. Was eventually transferred to CIR.   Follow up for Heart Failure/LVAD: Returns for routine f/u. Continues to feel good. Walking a lot. Going to CR 3x/week. Denies CP or SOB. Not watching diet at all according to wife. Taking lasix almost everyday. Edema much improved. He is now off ASA due to epistaxis in 9/17 requiting packing.  MAP is stable. Continue to struggle with early dementia. Seen by Neurology and scored 16/30 on MSSE.   Denies LVAD alarms.  Denies driveline trauma, erythema or drainage.  Denies ICD shocks.   Reports taking Coumadin as prescribed and adherence to anticoagulation based dietary restrictions.  Denies bright red blood per rectum or melena, no dark urine or hematuria. No focal. neuro sx.   VAD Indication: Destination [place]- age excluding.   VAD interrogation & Equipment Management: Speed: 9200 Flow: 6.2 Power: 6.2 w PI: 6.0  Alarms: no clinical alarms, few low volt advisories Events: rare   Past Medical History:  Diagnosis Date  . Automatic implantable cardioverter-defibrillator in situ   . GERD (gastroesophageal reflux disease)   . H/O hiatal hernia   . Insomnia   . Nephrolithiasis   . Nonischemic cardiomyopathy (HCC)    Cardiac catheterization 12/2011 with LVEF 45%, no significant obstructive CAD  . Osteoarthritis   . Pneumonia    Dec 2016    Current Outpatient Prescriptions  Medication Sig Dispense Refill  . acetaminophen (TYLENOL) 325 MG tablet Take 2 tablets (650 mg total) by mouth every 4 (four) hours as needed for mild pain or moderate pain.    . calcium carbonate (OSCAL) 1500 (600 Ca) MG TABS tablet Take by mouth daily.    Marland Kitchen Cod Liver Oil 1000 MG CAPS Take by mouth 3 (three) times a  week.     . ferrous fumarate-b12-vitamic C-folic acid (TRINSICON / FOLTRIN) capsule Take 1 capsule by mouth 3 (three) times daily after meals. (Patient taking differently: Take 1 capsule by mouth daily with breakfast. ) 90 capsule 0  . furosemide (LASIX) 20 MG tablet Take 2 tablets (40 mg total) by mouth daily. And as needed. 60 tablet 3  . Glucosamine Sulfate 1000 MG CAPS Take by mouth daily.    . Multiple Vitamin (MULTIVITAMIN) tablet Take 1 tablet by mouth daily.    . pantoprazole (PROTONIX) 40 MG tablet Take 1 tablet (40 mg total) by mouth daily. 30 tablet 0  . potassium chloride SA (K-DUR,KLOR-CON) 20 MEQ tablet Take 2 tablets (40 mEq total) by mouth daily. 90 tablet 3  . warfarin (COUMADIN) 3 MG tablet Take 2 tablets (6 mg total) by mouth daily at 6 PM. Or as directed by LVAD Clinic. 180 tablet 3  . amoxicillin (AMOXIL) 500 MG capsule Take 4 capsules (2,000 mg total) by mouth once as needed (Take 30-60 minutes before dental cleaning/procedure). (Patient not taking: Reported on 02/27/2016) 12 capsule 0   No current facility-administered medications for this encounter.     Phenergan [promethazine hcl]; Lasix [furosemide]; and Morphine and related  REVIEW OF SYSTEMS: All systems negative except as listed in HPI, PMH and Problem list.   Vital Signs:  Doppler Pressure:100 Automatc BP: 109/59 (86)  HR:  SPO2: 98 %  Weight: 198.2 lb w/ eqt Last weight: 191  Lb Home Weights:  189 lb. (up 4 lbs since last visit)   Physical Exam: GENERAL: Well appearing, male who presents to clinic today in no acute distress. Ambulated in the clinic without difficulty HEENT: normal NECK: Supple, JVP 7,  2+ bilaterally, no bruits.  No lymphadenopathy or thyromegaly appreciated.  CARDIAC:  Mechanical heart sounds with LVAD hum present.  LUNGS:  Clear to auscultation bilaterally.  ABDOMEN:  Soft, round, nontender, positive bowel sounds x4.     LVAD exit site: well-healed and incorporated.    Stabilization device present and accurately applied.  Driveline dressing is being changed daily per sterile technique. EXTREMITIES:  Warm and dry, no cyanosis, clubbing, rash. Minimal edema  NEUROLOGIC:  Alert and oriented.     ASSESSMENT AND PLAN:   1. 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 EF 15-20%. S/p HM-II LVAD 08/30/15 NYHA II.  MAP stable.  VAD parameters stable.   -Not watching diet at all but volume status improved with increased lasix. Continue current regimen. - Wear compression stockings during the day. - Continue cardiac rehab.  - He is 6 months out from VAD placement. Doing well. Labs stable. VAD parameters stable. Walked 1150 feet with 6MW. 6 months INTERMACs paperwork completed in clinic  2. Frequent PVC: Off amiodarone, will follow.  3. Anticoagulation management: INR goal 2.0-3.0. Off ASA with epistaxis.  4. Dementia - Unchanged. Recently has seen Neurology. MSSE 16/30. They have started low-dose SSRI. If no improvement will consider Aricept.   Bensimhon, Daniel,MD 03/02/2016

## 2016-03-04 ENCOUNTER — Other Ambulatory Visit (HOSPITAL_COMMUNITY): Payer: Self-pay | Admitting: *Deleted

## 2016-03-04 ENCOUNTER — Encounter (HOSPITAL_COMMUNITY)
Admission: RE | Admit: 2016-03-04 | Discharge: 2016-03-04 | Disposition: A | Discharge status: Home / Self Care | Payer: Medicare Other | Source: Ambulatory Visit | Attending: Internal Medicine | Admitting: Internal Medicine

## 2016-03-04 DIAGNOSIS — I5022 Chronic systolic (congestive) heart failure: Secondary | ICD-10-CM

## 2016-03-04 DIAGNOSIS — Z95811 Presence of heart assist device: Secondary | ICD-10-CM

## 2016-03-04 MED ORDER — FUROSEMIDE 20 MG PO TABS: 40.0000 mg | ORAL_TABLET | Freq: Every day | ORAL | 3 refills | Status: AC

## 2016-03-04 NOTE — Telephone Encounter (Signed)
[employer] called asking to verify lasix prescription.  Spoke with [name], RN and she verified that patient should be taking 40 mg (2 Tabs) Once Daily.  New prescription sent over to [facility].  I also called [employer] back to let them know to disregard old prescription.  No further questions at this time.

## 2016-03-04 NOTE — Progress Notes (Signed)
Daily Session Note  Patient Details  Name: [name] MRN: [id number] Date of Birth: 04-Dec-1940 Referring Provider:   Flowsheet Row CARDIAC REHAB PHASE II ORIENTATION from 12/11/2015 in [place]  Referring Provider  Dr. Haroldine Laws      Encounter Date: 03/04/2016  Check In:     Session Check In - 03/04/16 0930      Check-In   Location [facility]   Staff Present Russella Dar, MS, EP, [facility], Exercise Physiologist;Gregory Luther Parody, BS, EP, Exercise Physiologist;Louretta Tantillo Wynetta Emery, RN, BSN   Supervising physician immediately available to respond to emergencies See telemetry face sheet for immediately available MD   Medication changes reported     No   Fall or balance concerns reported    No   Warm-up and Cool-down Performed as group-led instruction   Resistance Training Performed Yes   VAD Patient? Yes     VAD patient   Has back up controller? Yes   Has spare charged batteries? Yes   Has battery cables? Yes   Has compatible battery clips? Yes     Pain Assessment   Currently in Pain? No/denies   Pain Score 0-No pain   Multiple Pain Sites No      Capillary Blood Glucose: No results found for this or any previous visit (from the past 24 hour(s)).   Goals Met:  Independence with exercise equipment Exercise tolerated well No report of cardiac concerns or symptoms Strength training completed today  Goals Unmet:  Not Applicable  Comments: Check out 1030.   Dr. Kate Sable is Medical Director for [facility] Cardiac and Pulmonary Rehab.

## 2016-03-06 ENCOUNTER — Encounter (HOSPITAL_COMMUNITY)
Admission: RE | Admit: 2016-03-06 | Discharge: 2016-03-06 | Disposition: A | Discharge status: Home / Self Care | Payer: Medicare Other | Source: Ambulatory Visit | Attending: Internal Medicine | Admitting: Internal Medicine

## 2016-03-06 DIAGNOSIS — Z95811 Presence of heart assist device: Secondary | ICD-10-CM | POA: Diagnosis not present

## 2016-03-06 NOTE — Progress Notes (Signed)
Daily Session Note  Patient Details  Name: [name] MRN: [id number] Date of Birth: 08-16-1940 Referring Provider:   Flowsheet Row CARDIAC REHAB PHASE II ORIENTATION from 12/11/2015 in [place]  Referring Provider  Dr. Haroldine Laws      Encounter Date: 03/06/2016  Check In:     Session Check In - 03/06/16 0930      Check-In   Location [facility]   Staff Present Suzanne Boron, BS, EP, Exercise Physiologist;Leilanni Halvorson Wynetta Emery, RN, BSN   Supervising physician immediately available to respond to emergencies See telemetry face sheet for immediately available MD   Medication changes reported     No   Fall or balance concerns reported    No   Warm-up and Cool-down Performed as group-led instruction   Resistance Training Performed Yes   VAD Patient? Yes     VAD patient   Has back up controller? Yes   Has spare charged batteries? Yes   Has battery cables? Yes   Has compatible battery clips? Yes     Pain Assessment   Currently in Pain? No/denies   Pain Score 0-No pain   Multiple Pain Sites No      Capillary Blood Glucose: No results found for this or any previous visit (from the past 24 hour(s)).   Goals Met:  Independence with exercise equipment Exercise tolerated well No report of cardiac concerns or symptoms Strength training completed today  Goals Unmet:  Not Applicable  Comments: Check out 1030.   Dr. Kate Sable is Medical Director for [facility] Cardiac and Pulmonary Rehab.

## 2016-03-11 ENCOUNTER — Encounter (HOSPITAL_COMMUNITY)
Admission: RE | Admit: 2016-03-11 | Discharge: 2016-03-11 | Disposition: A | Discharge status: Home / Self Care | Payer: Medicare Other | Source: Ambulatory Visit | Attending: Internal Medicine | Admitting: Internal Medicine

## 2016-03-11 VITALS — Ht 75.0 in | Wt 187.8 lb

## 2016-03-11 DIAGNOSIS — Z95811 Presence of heart assist device: Secondary | ICD-10-CM | POA: Diagnosis present

## 2016-03-11 DIAGNOSIS — I428 Other cardiomyopathies: Secondary | ICD-10-CM | POA: Diagnosis not present

## 2016-03-11 DIAGNOSIS — Z7901 Long term (current) use of anticoagulants: Secondary | ICD-10-CM | POA: Insufficient documentation

## 2016-03-11 DIAGNOSIS — M1990 Unspecified osteoarthritis, unspecified site: Secondary | ICD-10-CM | POA: Insufficient documentation

## 2016-03-11 DIAGNOSIS — Z79899 Other long term (current) drug therapy: Secondary | ICD-10-CM | POA: Diagnosis not present

## 2016-03-11 DIAGNOSIS — I5022 Chronic systolic (congestive) heart failure: Secondary | ICD-10-CM | POA: Diagnosis not present

## 2016-03-11 DIAGNOSIS — K219 Gastro-esophageal reflux disease without esophagitis: Secondary | ICD-10-CM | POA: Diagnosis not present

## 2016-03-11 NOTE — Progress Notes (Signed)
Daily Session Note  Patient Details  Name: [name] MRN: [id number] Date of Birth: Jan 25, 1941 Referring Provider:   Flowsheet Row CARDIAC REHAB PHASE II ORIENTATION from 12/11/2015 in [place]  Referring Provider  Dr. Haroldine Laws      Encounter Date: 03/11/2016  Check In:     Session Check In - 03/11/16 0930      Check-In   Location [facility]   Staff Present Russella Dar, MS, EP, [facility], Exercise Physiologist;Yarelin Reichardt Wynetta Emery, RN, BSN   Supervising physician immediately available to respond to emergencies See telemetry face sheet for immediately available MD   Medication changes reported     No   Fall or balance concerns reported    No   Warm-up and Cool-down Performed as group-led instruction   Resistance Training Performed Yes   VAD Patient? Yes     VAD patient   Has back up controller? Yes   Has spare charged batteries? Yes   Has battery cables? Yes   Has compatible battery clips? Yes     Pain Assessment   Currently in Pain? No/denies   Pain Score 0-No pain   Multiple Pain Sites No      Capillary Blood Glucose: No results found for this or any previous visit (from the past 24 hour(s)).   Goals Met:  Independence with exercise equipment Exercise tolerated well No report of cardiac concerns or symptoms Strength training completed today  Goals Unmet:  Not Applicable  Comments: Check out 1030.   Dr. Kate Sable is Medical Director for [facility] Cardiac and Pulmonary Rehab.

## 2016-03-13 ENCOUNTER — Encounter (HOSPITAL_COMMUNITY)
Admission: RE | Admit: 2016-03-13 | Discharge: 2016-03-13 | Disposition: A | Discharge status: Home / Self Care | Payer: Medicare Other | Source: Ambulatory Visit | Attending: Internal Medicine | Admitting: Internal Medicine

## 2016-03-13 DIAGNOSIS — Z95811 Presence of heart assist device: Secondary | ICD-10-CM

## 2016-03-13 NOTE — Progress Notes (Signed)
Daily Session Note  Patient Details  Name: [name] MRN: [id number] Date of Birth: 09-06-1940 Referring Provider:   Flowsheet Row CARDIAC REHAB PHASE II ORIENTATION from 12/11/2015 in [place]  Referring Provider  Dr. Haroldine Laws      Encounter Date: 03/13/2016  Check In:     Session Check In - 03/13/16 0930      Check-In   Location [facility]   Staff Present Suzanne Boron, BS, EP, Exercise Physiologist;Farhana Fellows Wynetta Emery, RN, BSN;Diane Coad, MS, EP, [facility], Exercise Physiologist   Supervising physician immediately available to respond to emergencies See telemetry face sheet for immediately available MD   Medication changes reported     No   Warm-up and Cool-down Performed as group-led instruction   Resistance Training Performed Yes   VAD Patient? Yes     VAD patient   Has back up controller? Yes   Has spare charged batteries? Yes   Has battery cables? Yes   Has compatible battery clips? Yes     Pain Assessment   Currently in Pain? No/denies   Pain Score 0-No pain   Multiple Pain Sites No      Capillary Blood Glucose: No results found for this or any previous visit (from the past 24 hour(s)).   Goals Met:  Independence with exercise equipment Exercise tolerated well No report of cardiac concerns or symptoms Strength training completed today  Goals Unmet:  Not Applicable  Comments: Check out 1030.   Dr. Kate Sable is Medical Director for [facility] Cardiac and Pulmonary Rehab.

## 2016-03-16 ENCOUNTER — Encounter (HOSPITAL_COMMUNITY): Payer: Medicare Other

## 2016-03-17 LAB — CUP PACEART INCLINIC DEVICE CHECK
Battery Remaining Longevity: 51 mo
Battery Voltage: 2.97 V
Brady Statistic AS VP Percent: 29.83 %
Brady Statistic RA Percent Paced: 57.05 %
Brady Statistic RV Percent Paced: 82.17 %
Date Time Interrogation Session: 20171221202050
HIGH POWER IMPEDANCE MEASURED VALUE: 58 Ohm
Implantable Lead Implant Date: 20141008
Implantable Lead Implant Date: 20141008
Implantable Lead Location: 753859
Implantable Lead Location: 753860
Implantable Lead Model: 5076
Implantable Lead Model: 6935
Implantable Pulse Generator Implant Date: 20141008
Lead Channel Impedance Value: 342 Ohm
Lead Channel Impedance Value: 456 Ohm
Lead Channel Impedance Value: 513 Ohm
Lead Channel Impedance Value: 532 Ohm
Lead Channel Impedance Value: 551 Ohm
Lead Channel Impedance Value: 551 Ohm
Lead Channel Impedance Value: 551 Ohm
Lead Channel Impedance Value: 608 Ohm
Lead Channel Pacing Threshold Amplitude: 0.75 V
Lead Channel Pacing Threshold Pulse Width: 0.4 ms
Lead Channel Pacing Threshold Pulse Width: 0.4 ms
Lead Channel Pacing Threshold Pulse Width: 0.4 ms
Lead Channel Sensing Intrinsic Amplitude: 1.625 mV
Lead Channel Sensing Intrinsic Amplitude: 3.375 mV
Lead Channel Setting Pacing Amplitude: 1.75 V
Lead Channel Setting Pacing Amplitude: 2 V
Lead Channel Setting Pacing Amplitude: 2.25 V
Lead Channel Setting Pacing Pulse Width: 0.4 ms
MDC IDC LEAD IMPLANT DT: 20141008
MDC IDC LEAD LOCATION: 753858
MDC IDC LEAD MODEL: 4298
MDC IDC MSMT LEADCHNL LV IMPEDANCE VALUE: 304 Ohm
MDC IDC MSMT LEADCHNL LV IMPEDANCE VALUE: 342 Ohm
MDC IDC MSMT LEADCHNL LV IMPEDANCE VALUE: 361 Ohm
MDC IDC MSMT LEADCHNL LV IMPEDANCE VALUE: 589 Ohm
MDC IDC MSMT LEADCHNL LV PACING THRESHOLD AMPLITUDE: 1.25 V
MDC IDC MSMT LEADCHNL RA IMPEDANCE VALUE: 399 Ohm
MDC IDC MSMT LEADCHNL RV PACING THRESHOLD AMPLITUDE: 1 V
MDC IDC SET LEADCHNL LV PACING PULSEWIDTH: 0.4 ms
MDC IDC SET LEADCHNL RV SENSING SENSITIVITY: 0.3 mV
MDC IDC STAT BRADY AP VP PERCENT: 62.25 %
MDC IDC STAT BRADY AP VS PERCENT: 1.06 %
MDC IDC STAT BRADY AS VS PERCENT: 6.87 %

## 2016-03-18 ENCOUNTER — Encounter (HOSPITAL_COMMUNITY): Payer: Medicare Other

## 2016-03-19 ENCOUNTER — Other Ambulatory Visit: Payer: Self-pay | Admitting: Internal Medicine

## 2016-03-19 LAB — PROTIME-INR: INR: 1.7 — AB (ref <=1.1)

## 2016-03-19 NOTE — Progress Notes (Signed)
Cardiac Individual Treatment Plan  Patient Details  Name: [name] MRN: [id number] Date of Birth: 11-26-1940 Referring Provider:   Flowsheet Row CARDIAC REHAB PHASE II ORIENTATION from 12/11/2015 in [place]  Referring Provider  Dr. Haroldine Laws      Initial Encounter Date:  Flowsheet Row CARDIAC REHAB PHASE II ORIENTATION from 12/11/2015 in [place]  Date  12/11/15  Referring Provider  Dr. Haroldine Laws      Visit Diagnosis: LVAD (left ventricular assist device) present [facility])  Patient's Home Medications on Admission:  Current Outpatient Prescriptions:  .  acetaminophen (TYLENOL) 325 MG tablet, Take 2 tablets (650 mg total) by mouth every 4 (four) hours as needed for mild pain or moderate pain., Disp: , Rfl:  .  amoxicillin (AMOXIL) 500 MG capsule, Take 4 capsules (2,000 mg total) by mouth once as needed (Take 30-60 minutes before dental cleaning/procedure). (Patient not taking: Reported on 02/27/2016), Disp: 12 capsule, Rfl: 0 .  calcium carbonate (OSCAL) 1500 (600 Ca) MG TABS tablet, Take by mouth daily., Disp: , Rfl:  .  Cod Liver Oil 1000 MG CAPS, Take by mouth 3 (three) times a week. , Disp: , Rfl:  .  ferrous [id number] C-folic acid (TRINSICON / FOLTRIN) capsule, Take 1 capsule by mouth 3 (three) times daily after meals. (Patient taking differently: Take 1 capsule by mouth daily with breakfast. ), Disp: 90 capsule, Rfl: 0 .  furosemide (LASIX) 20 MG tablet, Take 2 tablets (40 mg total) by mouth daily., Disp: 60 tablet, Rfl: 3 .  Glucosamine Sulfate 1000 MG CAPS, Take by mouth daily., Disp: , Rfl:  .  Multiple Vitamin (MULTIVITAMIN) tablet, Take 1 tablet by mouth daily., Disp: , Rfl:  .  pantoprazole (PROTONIX) 40 MG tablet, Take 1 tablet (40 mg total) by mouth daily., Disp: 30 tablet, Rfl: 0 .  potassium chloride SA (K-DUR,KLOR-CON) 20 MEQ tablet, Take 2 tablets (40 mEq total) by mouth daily., Disp: 90 tablet, Rfl: 3 .   warfarin (COUMADIN) 3 MG tablet, Take 2 tablets (6 mg total) by mouth daily at 6 PM. Or as directed by LVAD Clinic., Disp: 180 tablet, Rfl: 3  Past Medical History: Past Medical History:  Diagnosis Date  . Automatic implantable cardioverter-defibrillator in situ   . GERD (gastroesophageal reflux disease)   . H/O hiatal hernia   . Insomnia   . Nephrolithiasis   . Nonischemic cardiomyopathy ([place])    Cardiac catheterization 12/2011 with LVEF 45%, no significant obstructive CAD  . Osteoarthritis   . Pneumonia    Dec 2016    Tobacco Use: History  Smoking Status  . Never Smoker  Smokeless Tobacco  . 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[employer] Cardiac and Pulmonary Rehab Latest Ref Rng & Units 09/16/2015 09/17/2015 09/18/2015 09/19/2015 11/11/2015   Cholestrol 0 - 200 mg/dL - - - - -   LDLCALC 0 - 99 mg/dL - - - - -   HDL >40 mg/dL - - - - -   Trlycerides <150 mg/dL - - - - -   Hemoglobin A1c 4.8 - 5.6 % - - - - -   PHART 7.350 - 7.450 - - - - -   PCO2ART 35.0 - 45.0 mmHg - - - - -   HCO3 20.0 - 24.0 mEq/L - - - - -   TCO2 0 - 100 mmol/L - - - - 25   ACIDBASEDEF 0.0 - 2.0 mmol/L - - - - -  O2SAT % 70.0 61.9 83.4 71.8 -      Capillary Blood Glucose: Lab Results  Component Value Date   GLUCAP 133 (H) 09/19/2015   GLUCAP 106 (H) 09/19/2015   GLUCAP 183 (H) 09/18/2015   GLUCAP 120 (H) 09/18/2015   GLUCAP 166 (H) 09/18/2015     Exercise Target Goals:    Exercise Program Goal: Individual exercise prescription set with THRR, safety & activity barriers. Participant demonstrates ability to understand and report RPE using BORG scale, to self-measure pulse accurately, and to acknowledge the importance of the exercise prescription.  Exercise Prescription Goal: Starting with aerobic activity 30 plus minutes a day, 3 days per week for initial exercise prescription. Provide home exercise prescription and guidelines that participant acknowledges understanding  prior to discharge.  Activity Barriers & Risk Stratification:     Activity Barriers & Cardiac Risk Stratification - 12/11/15 1530      Activity Barriers & Cardiac Risk Stratification   Activity Barriers None   Cardiac Risk Stratification High      6 Minute Walk:     6 Minute Walk    Row Name 12/12/15 0830 03/16/16 1514       6 Minute Walk   Phase Initial Discharge    Distance 1200 feet 1300 feet    Distance % Change  - 8.33 %    Walk Time 6 minutes 6 minutes    # of Rest Breaks 0 0    MPH 2.27 2.46    METS 2.74 2.88    RPE 11 11    Perceived Dyspnea  10 11    VO2 Peak 8.92 11.63    Symptoms  - No    Resting HR 61 bpm 51 bpm    Resting BP 100/80 98/79    Max Ex. HR 128 bpm 113 bpm    Max Ex. BP 120/80 137/92    2 Minute Post BP 100/80 93/76       Initial Exercise Prescription:     Initial Exercise Prescription - 12/12/15 0800      Date of Initial Exercise RX and Referring Provider   Date 12/11/15   Referring Provider Dr. Haroldine Laws     Treadmill   MPH 1.3   Grade 0   Minutes 15   METs 1.9     NuStep   Level 2   Watts 18   Minutes 15   METs 2.3     Arm Ergometer   Level 2   Watts 15   Minutes 20   METs 2.5     Prescription Details   Frequency (times per week) 3   Duration Progress to 30 minutes of continuous aerobic without signs/symptoms of physical distress     Intensity   THRR REST +  30   THRR 40-80% of Max Heartrate 95-113-130   Ratings of Perceived Exertion 11-13     Progression   Progression Continue to progress workloads to maintain intensity without signs/symptoms of physical distress.     Resistance Training   Training Prescription Yes   Weight 1   Reps 10-12      Perform Capillary Blood Glucose checks as needed.  Exercise Prescription Changes:      Exercise Prescription Changes    Row Name 12/17/15 1000 01/14/16 0800 02/13/16 1400         Exercise Review   Progression No Yes Yes       Response to Exercise    Blood Pressure (Admit) 91/75  MAP  98 94/71  MAP 82 92/76  MAP 78     Blood Pressure (Exercise) 123/91  MAP 99 110/61  MAP 90 140/109  MAP 110     Blood Pressure (Exit) 115/82  MAP 94 98/71  MAP 80 98/81  MAP 100     Heart Rate (Admit) 69 bpm 59 bpm 74 bpm     Heart Rate (Exercise) 91 bpm 96 bpm 114 bpm     Heart Rate (Exit) 75 bpm 68 bpm 77 bpm     Rating of Perceived Exertion (Exercise) '11 11 10     '$ Duration Progress to 30 minutes of continuous aerobic without signs/symptoms of physical distress Progress to 30 minutes of continuous aerobic without signs/symptoms of physical distress Progress to 30 minutes of continuous aerobic without signs/symptoms of physical distress     Intensity Rest + 30 Rest + 30 Rest + 30       Progression   Progression Continue progressive overload as per policy without signs/symptoms or physical distress. Continue progressive overload as per policy without signs/symptoms or physical distress. Continue progressive overload as per policy without signs/symptoms or physical distress.       Resistance Training   Training Prescription Yes Yes Yes     Weight '1 1 2     '$ Reps 10-12 10-12 10-12       Treadmill   MPH 0 0 0     Grade 0 0 0     Minutes 0 0 0     METs 0 0 0       NuStep   Level '2 2 3     '$ Watts '14 16 27     '$ Minutes '15 15 15     '$ METs 3.58 3.59 3.6       Arm Ergometer   Level 2 2.4 2.3     Watts '9 11 24     '$ Minutes '20 20 20     '$ METs 1.8 2.3 3.3       Home Exercise Plan   Plans to continue exercise at [facility]     Frequency Add 2 additional days to program exercise sessions. Add 2 additional days to program exercise sessions. Add 2 additional days to program exercise sessions.        Exercise Comments:      Exercise Comments    Row Name 12/17/15 1040 01/14/16 0829 02/13/16 1446       Exercise Comments Patient has just started rehab and has some cognitive issues. Was not put on treadmill due to this reason as well.  Patient is  progressing appropriately Patient is progressing appropriately          Discharge Exercise Prescription (Final Exercise Prescription Changes):     Exercise Prescription Changes - 02/13/16 1400      Exercise Review   Progression Yes     Response to Exercise   Blood Pressure (Admit) 92/76  MAP 78   Blood Pressure (Exercise) 140/109  MAP 110   Blood Pressure (Exit) 98/81  MAP 100   Heart Rate (Admit) 74 bpm   Heart Rate (Exercise) 114 bpm   Heart Rate (Exit) 77 bpm   Rating of Perceived Exertion (Exercise) 10   Duration Progress to 30 minutes of continuous aerobic without signs/symptoms of physical distress   Intensity Rest + 30     Progression   Progression Continue progressive overload as per policy without signs/symptoms or physical distress.     Resistance Training  Training Prescription Yes   Weight 2   Reps 10-12     Treadmill   MPH 0   Grade 0   Minutes 0   METs 0     NuStep   Level 3   Watts 27   Minutes 15   METs 3.6     Arm Ergometer   Level 2.3   Watts 24   Minutes 20   METs 3.3     Home Exercise Plan   Plans to continue exercise at Home   Frequency Add 2 additional days to program exercise sessions.      Nutrition:  Target Goals: Understanding of nutrition guidelines, daily intake of sodium '1500mg'$ , cholesterol '200mg'$ , calories 30% from fat and 7% or less from saturated fats, daily to have 5 or more servings of fruits and vegetables.  Biometrics:     Pre Biometrics - 12/12/15 0843      Pre Biometrics   Height '6\' 3"'$  (1.905 m)   Weight 187 lb 8 oz (85 kg)   Waist Circumference 38 inches   Hip Circumference 37 inches   Waist to Hip Ratio 1.03 %   BMI (Calculated) 23.5   Triceps Skinfold 9 mm   % Body Fat 22.6 %   Grip Strength 64.6 kg   Flexibility 0 in   Single Leg Stand 2 seconds         Post Biometrics - 03/16/16 1516       Post  Biometrics   Height '6\' 3"'$  (1.905 m)   Weight 187 lb 13.3 oz (85.2 kg)   Waist Circumference  38 inches   Hip Circumference 38 inches   Waist to Hip Ratio 1 %   BMI (Calculated) 23.5   Triceps Skinfold 9 mm   % Body Fat 22.7 %   Grip Strength 71.67 kg   Flexibility 0 in   Single Leg Stand 1 seconds      Nutrition Therapy Plan and Nutrition Goals:   Nutrition Discharge: Rate Your Plate Scores:     Nutrition Assessments - 03/19/16 1559      MEDFICTS Scores   Pre Score 82   Post Score 15   Score Difference -67      Nutrition Goals Re-Evaluation:   Psychosocial: Target Goals: Acknowledge presence or absence of depression, maximize coping skills, provide positive support system. Participant is able to verbalize types and ability to use techniques and skills needed for reducing stress and depression.  Initial Review & Psychosocial Screening:     Initial Psych Review & Screening - 12/11/15 1532      Initial Review   Current issues with Current Sleep Concerns  Patient says he does feel down and frustrated at times d/t not being able to do the things he used to do like yard work but no depression.      Family Dynamics   Good Support System? Yes     Barriers   Psychosocial barriers to participate in program There are no identifiable barriers or psychosocial needs.     Screening Interventions   Interventions Encouraged to exercise      Quality of Life Scores:     Quality of Life - 03/16/16 1517      Quality of Life Scores   Health/Function Pre 29.5 %   Health/Function Post 26.31 %   Health/Function % Change -10.81 %   Socioeconomic Pre 28.44 %   Socioeconomic Post 30 %   Socioeconomic % Change  5.49 %  Psych/Spiritual Pre 27.86 %   Psych/Spiritual Post 30 %   Psych/Spiritual % Change 7.68 %   Family Pre 30 %   Family Post 30 %   Family % Change 0 %   GLOBAL Pre 29 %   GLOBAL Post 28.55 %   GLOBAL % Change -1.55 %      PHQ-9: Recent Review Flowsheet Data    Depression screen [facility] 2/9 03/19/2016 12/11/2015   Decreased Interest 0 0   Down,  Depressed, Hopeless 0 1   PHQ - 2 Score 0 1   Altered sleeping 0 1   Tired, decreased energy 0 1   Change in appetite 0 0   Feeling bad or failure about yourself  0 0   Trouble concentrating 0 0   Moving slowly or fidgety/restless 0 0   Suicidal thoughts 0 0   PHQ-9 Score 0 3   Difficult doing work/chores - Not difficult at all      Psychosocial Evaluation and Intervention:     Psychosocial Evaluation - 03/19/16 1603      Discharge Psychosocial Assessment & Intervention   Comments Patient says he feels better emotionally after completing the program b/c he is able to do the things he used to do. No psychosocial barriers identified at discharge.       Psychosocial Re-Evaluation:     Psychosocial Re-Evaluation    [place] Name 01/20/16 1356 02/17/16 1502           Psychosocial Re-Evaluation   Interventions Encouraged to attend Cardiac Rehabilitation for the exercise Encouraged to attend Cardiac Rehabilitation for the exercise      Comments Patient's QOL score was 29 and his PHQ-9 score was 3. No psychosocial issues identified.  Patient continues to have no psychosocial issues identified.       Continued Psychosocial Services Needed No No         Vocational Rehabilitation: Provide vocational rehab assistance to qualifying candidates.   Vocational Rehab Evaluation & Intervention:     Vocational Rehab - 12/11/15 1531      Initial Vocational Rehab Evaluation & Intervention   Assessment shows need for Vocational Rehabilitation No      Education: Education Goals: Education classes will be provided on a weekly basis, covering required topics. Participant will state understanding/return demonstration of topics presented.  Learning Barriers/Preferences:     Learning Barriers/Preferences - 12/11/15 1530      Learning Barriers/Preferences   Learning Barriers None   Learning Preferences Skilled Demonstration      Education Topics: Hypertension, Hypertension  Reduction -Define heart disease and high blood pressure. Discus how high blood pressure affects the body and ways to reduce high blood pressure. Flowsheet Row CARDIAC REHAB PHASE II EXERCISE from 03/11/2016 in [place]  Date  01/08/16  Educator  DC  Instruction Review Code  2- meets goals/outcomes      Exercise and Your Heart -Discuss why it is important to exercise, the FITT principles of exercise, normal and abnormal responses to exercise, and how to exercise safely. Flowsheet Row CARDIAC REHAB PHASE II EXERCISE from 03/11/2016 in [place]  Date  01/15/16  Educator  Russella Dar  Instruction Review Code  2- meets goals/outcomes      Angina -Discuss definition of angina, causes of angina, treatment of angina, and how to decrease risk of having angina. Flowsheet Row CARDIAC REHAB PHASE II EXERCISE from 03/11/2016 in [place]  Date  01/22/16  Educator  Diane Coad  Instruction Review Code  2- meets goals/outcomes      Cardiac Medications -Review what the following cardiac medications are used for, how they affect the body, and side effects that may occur when taking the medications.  Medications include Aspirin, Beta blockers, calcium channel blockers, ACE Inhibitors, angiotensin receptor blockers, diuretics, digoxin, and antihyperlipidemics. Flowsheet Row CARDIAC REHAB PHASE II EXERCISE from 03/11/2016 in [place] CARDIAC REHABILITATION  Date  01/29/16  Educator  Hart Rochester  Instruction Review Code  2- meets goals/outcomes      Congestive Heart Failure -Discuss the definition of CHF, how to live with CHF, the signs and symptoms of CHF, and how keep track of weight and sodium intake. Flowsheet Row CARDIAC REHAB PHASE II EXERCISE from 03/11/2016 in [place] CARDIAC REHABILITATION  Date  02/05/16  Educator  Jae Dire  Instruction Review Code  2- meets goals/outcomes      Heart Disease and  Intimacy -Discus the effect sexual activity has on the heart, how changes occur during intimacy as we age, and safety during sexual activity. Flowsheet Row CARDIAC REHAB PHASE II EXERCISE from 03/11/2016 in [place] CARDIAC REHABILITATION  Date  02/12/16  Educator  Hart Rochester  Instruction Review Code  2- meets goals/outcomes      Smoking Cessation / COPD -Discuss different methods to quit smoking, the health benefits of quitting smoking, and the definition of COPD.   Nutrition I: Fats -Discuss the types of cholesterol, what cholesterol does to the heart, and how cholesterol levels can be controlled. Flowsheet Row CARDIAC REHAB PHASE II EXERCISE from 03/11/2016 in [place] CARDIAC REHABILITATION  Date  02/26/16  Educator  Hart Rochester  Instruction Review Code  2- meets goals/outcomes      Nutrition II: Labels -Discuss the different components of food labels and how to read food label Flowsheet Row CARDIAC REHAB PHASE II EXERCISE from 03/11/2016 in [place] CARDIAC REHABILITATION  Date  03/04/16  Educator  Hart Rochester  Instruction Review Code  2- meets goals/outcomes      Heart Parts and Heart Disease -Discuss the anatomy of the heart, the pathway of blood circulation through the heart, and these are affected by heart disease. Flowsheet Row CARDIAC REHAB PHASE II EXERCISE from 03/11/2016 in [place] CARDIAC REHABILITATION  Date  03/11/16  Educator  Hart Rochester  Instruction Review Code  2- meets goals/outcomes      Stress I: Signs and Symptoms -Discuss the causes of stress, how stress may lead to anxiety and depression, and ways to limit stress. Flowsheet Row CARDIAC REHAB PHASE II EXERCISE from 03/11/2016 in [place] CARDIAC REHABILITATION  Date  12/18/15  Educator  Diane coad  Instruction Review Code  2- meets goals/outcomes      Stress II: Relaxation -Discuss different types of relaxation techniques to limit stress.   Warning Signs of Stroke / TIA -Discuss definition  of a stroke, what the signs and symptoms are of a stroke, and how to identify when someone is having stroke.   Knowledge Questionnaire Score:     Knowledge Questionnaire Score - 03/19/16 1558      Knowledge Questionnaire Score   Pre Score 21/24   Post Score 24/24      Core Components/Risk Factors/Patient Goals at Admission:     Personal Goals and Risk Factors at Admission - 12/12/15 0746      Core Components/Risk Factors/Patient Goals on Admission    Weight Management Weight Maintenance   Increase Strength and  Stamina Yes   Intervention Provide advice, education, support and counseling about physical activity/exercise needs.;Develop an individualized exercise prescription for aerobic and resistive training based on initial evaluation findings, risk stratification, comorbidities and participant's personal goals.   Expected Outcomes Achievement of increased cardiorespiratory fitness and enhanced flexibility, muscular endurance and strength shown through measurements of functional capacity and personal statement of participant.   Heart Failure Yes   Intervention Provide a combined exercise and nutrition program that is supplemented with education, support and counseling about heart failure. Directed toward relieving symptoms such as shortness of breath, decreased exercise tolerance, and extremity edema.   Personal Goal Other Yes   Personal Goal Be able to do things on his own. Have a better attitude and get rid of fear of death.    Intervention Patient will attend program exercising 3 days/week and supplement with exercise at home 2 to 4 days/week.    Expected Outcomes Patient will complete the program and meet his above stated goals.       Core Components/Risk Factors/Patient Goals Review:      Goals and Risk Factor Review    Row Name 12/12/15 0752 01/20/16 1352 02/17/16 1500 03/19/16 1600       Core Components/Risk Factors/Patient Goals Review   Personal Goals Review Weight  Management/Obesity;Increase Strength and Stamina;Heart Failure;Other  Be able to do things on his own. Have a better attitude and get rid of fear of death.  Weight Management/Obesity;Other  Be able to do things on his own; have a better attitude and get rid of fear of death.  Increase Strength and Stamina  Be able to do things on his own; have a better attitude. Weight Management/Obesity;Increase Strength and Stamina;Heart Failure  Be able to do things on his own. Have a better attitude and get rid of fear of death.     Review  - Patient has had 16 sessions. He is progressing well in the program. His strength and stamina are increasing and he has improved balance. He has gained 8.5 lbs. He interacts well with staff and other patients in his class. His attitude toward his illness is improving.  Patient has had 27 sessions gaining 10.5 lbs since starting the program. He is continues to  progress well in the program with continued increased strength and stamina and improved balance.  He continues to interact well with staff and other patients in his class. His attitude toward his illness is improving.  Upon graduation, patient gained 13.5 lbs; his MEDFICTS score improved however. He says he is able to do more now at home and feels 100% better after completing the program. His strength and stamina did increase. He did well in the program. He plans to join a maintance class.     Expected Outcomes  - Patient will complete program meeting his personal goals.  Patient will complete the program meeting his personal goals.  Patient will continue in an exercise program continuing to meet his personal goals.        Core Components/Risk Factors/Patient Goals at Discharge (Final Review):      Goals and Risk Factor Review - 03/19/16 1600      Core Components/Risk Factors/Patient Goals Review   Personal Goals Review Weight Management/Obesity;Increase Strength and Stamina;Heart Failure  Be able to do things on his  own. Have a better attitude and get rid of fear of death.    Review Upon graduation, patient gained 13.5 lbs; his MEDFICTS score improved however. He says he is able  to do more now at home and feels 100% better after completing the program. His strength and stamina did increase. He did well in the program. He plans to join a maintance class.    Expected Outcomes Patient will continue in an exercise program continuing to meet his personal goals.       ITP Comments:     ITP Comments    Row Name 12/12/15 1520 12/19/15 1308         ITP Comments Patient met with Registered Dietitian to discuss nutrition topics including: Heart healthty eating, heart health cooking and make smart choices when shopping; Portion control; weight management; and hydration. Patient attended a group session with the hospital chaplian called [facility] to discuss and share how his recent cardiac diagnosis has effected his life.  Patient new to program. He has had 3 sessions. Will continue to monitor for progress.          Comments: Patient graduated from [place] today on 03/14/15 after completing 36 sessions. He achieved LTG of 30 minutes of aerobic exercise at Max Met level of 3.3. All patients vitals are WNL. Patient has met with dietician. Discharge instruction has been reviewed in detail and patient stated an understanding of material given. Patient plans to join our maintance program. Cardiac Rehab staff will make f/u calls at 1 month, 6 months, and 1 year. Patient had no complaints of any abnormal S/S or pain on their exit visit.

## 2016-03-19 NOTE — Progress Notes (Signed)
Discharge Summary  Patient Details  Name: [name] MRN: [id number] Date of Birth: 03-10-1940 Referring Provider:   Flowsheet Row CARDIAC REHAB PHASE II ORIENTATION from 12/11/2015 in [facility] CARDIAC REHABILITATION  Referring Provider  Dr. Gala Romney       Number of Visits: 36  Reason for Discharge:  Patient reached a stable level of exercise. Patient independent in their exercise.  Smoking History:  History  Smoking Status  . Never Smoker  Smokeless Tobacco  . Former User    Diagnosis:  LVAD (left ventricular assist device) present (HCC)  ADL UCSD:   Initial Exercise Prescription:     Initial Exercise Prescription - 12/12/15 0800      Date of Initial Exercise RX and Referring Provider   Date 12/11/15   Referring Provider Dr. Gala Romney     Treadmill   MPH 1.3   Grade 0   Minutes 15   METs 1.9     NuStep   Level 2   Watts 18   Minutes 15   METs 2.3     Arm Ergometer   Level 2   Watts 15   Minutes 20   METs 2.5     Prescription Details   Frequency (times per week) 3   Duration Progress to 30 minutes of continuous aerobic without signs/symptoms of physical distress     Intensity   THRR REST +  30   THRR 40-80% of Max Heartrate 95-113-130   Ratings of Perceived Exertion 11-13     Progression   Progression Continue to progress workloads to maintain intensity without signs/symptoms of physical distress.     Resistance Training   Training Prescription Yes   Weight 1   Reps 10-12      Discharge Exercise Prescription (Final Exercise Prescription Changes):     Exercise Prescription Changes - 02/13/16 1400      Exercise Review   Progression Yes     Response to Exercise   Blood Pressure (Admit) 92/76  MAP 78   Blood Pressure (Exercise) 140/109  MAP 110   Blood Pressure (Exit) 98/81  MAP 100   Heart Rate (Admit) 74 bpm   Heart Rate (Exercise) 114 bpm   Heart Rate (Exit) 77 bpm   Rating of Perceived Exertion (Exercise) 10   Duration Progress to 30 minutes of continuous aerobic without signs/symptoms of physical distress   Intensity Rest + 30     Progression   Progression Continue progressive overload as per policy without signs/symptoms or physical distress.     Resistance Training   Training Prescription Yes   Weight 2   Reps 10-12     Treadmill   MPH 0   Grade 0   Minutes 0   METs 0     NuStep   Level 3   Watts 27   Minutes 15   METs 3.6     Arm Ergometer   Level 2.3   Watts 24   Minutes 20   METs 3.3     Home Exercise Plan   Plans to continue exercise at Home   Frequency Add 2 additional days to program exercise sessions.      Functional Capacity:     6 Minute Walk    Row Name 12/12/15 0830 03/16/16 1514       6 Minute Walk   Phase Initial Discharge    Distance 1200 feet 1300 feet    Distance % Change  - 8.33 %  Walk Time 6 minutes 6 minutes    # of Rest Breaks 0 0    MPH 2.27 2.46    METS 2.74 2.88    RPE 11 11    Perceived Dyspnea  10 11    VO2 Peak 8.92 11.63    Symptoms  - No    Resting HR 61 bpm 51 bpm    Resting BP 100/80 98/79    Max Ex. HR 128 bpm 113 bpm    Max Ex. BP 120/80 137/92    2 Minute Post BP 100/80 93/76       Psychological, QOL, Others - Outcomes: PHQ 2/9: Depression screen [facility] 2/9 03/19/2016 12/11/2015  Decreased Interest 0 0  Down, Depressed, Hopeless 0 1  PHQ - 2 Score 0 1  Altered sleeping 0 1  Tired, decreased energy 0 1  Change in appetite 0 0  Feeling bad or failure about yourself  0 0  Trouble concentrating 0 0  Moving slowly or fidgety/restless 0 0  Suicidal thoughts 0 0  PHQ-9 Score 0 3  Difficult doing work/chores - Not difficult at all    Quality of Life:     Quality of Life - 03/16/16 1517      Quality of Life Scores   Health/Function Pre 29.5 %   Health/Function Post 26.31 %   Health/Function % Change -10.81 %   Socioeconomic Pre 28.44 %   Socioeconomic Post 30 %   Socioeconomic % Change  5.49 %    Psych/Spiritual Pre 27.86 %   Psych/Spiritual Post 30 %   Psych/Spiritual % Change 7.68 %   Family Pre 30 %   Family Post 30 %   Family % Change 0 %   GLOBAL Pre 29 %   GLOBAL Post 28.55 %   GLOBAL % Change -1.55 %      Personal Goals: Goals established at orientation with interventions provided to work toward goal.     Personal Goals and Risk Factors at Admission - 12/12/15 0746      Core Components/Risk Factors/Patient Goals on Admission    Weight Management Weight Maintenance   Increase Strength and Stamina Yes   Intervention Provide advice, education, support and counseling about physical activity/exercise needs.;Develop an individualized exercise prescription for aerobic and resistive training based on initial evaluation findings, risk stratification, comorbidities and participant's personal goals.   Expected Outcomes Achievement of increased cardiorespiratory fitness and enhanced flexibility, muscular endurance and strength shown through measurements of functional capacity and personal statement of participant.   Heart Failure Yes   Intervention Provide a combined exercise and nutrition program that is supplemented with education, support and counseling about heart failure. Directed toward relieving symptoms such as shortness of breath, decreased exercise tolerance, and extremity edema.   Personal Goal Other Yes   Personal Goal Be able to do things on his own. Have a better attitude and get rid of fear of death.    Intervention Patient will attend program exercising 3 days/week and supplement with exercise at home 2 to 4 days/week.    Expected Outcomes Patient will complete the program and meet his above stated goals.        Personal Goals Discharge:     Goals and Risk Factor Review    Row Name 12/12/15 0752 01/20/16 1352 02/17/16 1500 03/19/16 1600       Core Components/Risk Factors/Patient Goals Review   Personal Goals Review Weight Management/Obesity;Increase Strength  and Stamina;Heart Failure;Other  Be able to do  things on his own. Have a better attitude and get rid of fear of death.  Weight Management/Obesity;Other  Be able to do things on his own; have a better attitude and get rid of fear of death.  Increase Strength and Stamina  Be able to do things on his own; have a better attitude. Weight Management/Obesity;Increase Strength and Stamina;Heart Failure  Be able to do things on his own. Have a better attitude and get rid of fear of death.     Review  - Patient has had 16 sessions. He is progressing well in the program. His strength and stamina are increasing and he has improved balance. He has gained 8.5 lbs. He interacts well with staff and other patients in his class. His attitude toward his illness is improving.  Patient has had 27 sessions gaining 10.5 lbs since starting the program. He is continues to  progress well in the program with continued increased strength and stamina and improved balance.  He continues to interact well with staff and other patients in his class. His attitude toward his illness is improving.  Upon graduation, patient gained 13.5 lbs; his MEDFICTS score improved however. He says he is able to do more now at home and feels 100% better after completing the program. His strength and stamina did increase. He did well in the program. He plans to join a maintance class.     Expected Outcomes  - Patient will complete program meeting his personal goals.  Patient will complete the program meeting his personal goals.  Patient will continue in an exercise program continuing to meet his personal goals.        Nutrition & Weight - Outcomes:     Pre Biometrics - 12/12/15 0843      Pre Biometrics   Height 6\' 3"  (1.905 m)   Weight 187 lb 8 oz (85 kg)   Waist Circumference 38 inches   Hip Circumference 37 inches   Waist to Hip Ratio 1.03 %   BMI (Calculated) 23.5   Triceps Skinfold 9 mm   % Body Fat 22.6 %   Grip Strength 64.6 kg    Flexibility 0 in   Single Leg Stand 2 seconds         Post Biometrics - 03/16/16 1516       Post  Biometrics   Height 6\' 3"  (1.905 m)   Weight 187 lb 13.3 oz (85.2 kg)   Waist Circumference 38 inches   Hip Circumference 38 inches   Waist to Hip Ratio 1 %   BMI (Calculated) 23.5   Triceps Skinfold 9 mm   % Body Fat 22.7 %   Grip Strength 71.67 kg   Flexibility 0 in   Single Leg Stand 1 seconds      Nutrition:   Nutrition Discharge:     Nutrition Assessments - 03/19/16 1559      MEDFICTS Scores   Pre Score 82   Post Score 15   Score Difference -67      Education Questionnaire Score:     Knowledge Questionnaire Score - 03/19/16 1558      Knowledge Questionnaire Score   Pre Score 21/24   Post Score 24/24      Goals reviewed with patient; copy given to patient.

## 2016-03-20 ENCOUNTER — Ambulatory Visit (HOSPITAL_COMMUNITY): Payer: Self-pay | Admitting: Pharmacist

## 2016-03-20 DIAGNOSIS — Z95811 Presence of heart assist device: Secondary | ICD-10-CM

## 2016-03-20 LAB — COAGUCHEK XS/INR WAIVED
INR: 1.7 — ABNORMAL HIGH (ref 0.9–1.1)
PROTHROMBIN TIME: 19.9 s

## 2016-03-30 ENCOUNTER — Other Ambulatory Visit: Payer: Self-pay | Admitting: Internal Medicine

## 2016-03-30 LAB — PROTIME-INR: INR: 2.1 — AB (ref <=1.1)

## 2016-03-31 ENCOUNTER — Ambulatory Visit (HOSPITAL_COMMUNITY): Payer: Self-pay | Admitting: Pharmacist

## 2016-03-31 DIAGNOSIS — Z95811 Presence of heart assist device: Secondary | ICD-10-CM

## 2016-03-31 LAB — COAGUCHEK XS/INR WAIVED
INR: 2.1 — AB (ref 0.9–1.1)
PROTHROMBIN TIME: 25 s

## 2016-04-01 ENCOUNTER — Ambulatory Visit (HOSPITAL_COMMUNITY)
Admission: RE | Admit: 2016-04-01 | Discharge: 2016-04-01 | Disposition: A | Discharge status: Home / Self Care | Payer: Medicare Other | Source: Ambulatory Visit | Attending: Cardiology | Admitting: Cardiology

## 2016-04-01 ENCOUNTER — Ambulatory Visit (HOSPITAL_COMMUNITY): Payer: Self-pay | Admitting: Pharmacist

## 2016-04-01 DIAGNOSIS — I493 Ventricular premature depolarization: Secondary | ICD-10-CM | POA: Diagnosis not present

## 2016-04-01 DIAGNOSIS — Z7901 Long term (current) use of anticoagulants: Secondary | ICD-10-CM | POA: Diagnosis not present

## 2016-04-01 DIAGNOSIS — I5022 Chronic systolic (congestive) heart failure: Secondary | ICD-10-CM | POA: Diagnosis not present

## 2016-04-01 DIAGNOSIS — Z95811 Presence of heart assist device: Secondary | ICD-10-CM | POA: Insufficient documentation

## 2016-04-01 LAB — CBC
HEMATOCRIT: 37.8 % — AB (ref 39.0–52.0)
Hemoglobin: 11.6 g/dL — ABNORMAL LOW (ref 13.0–17.0)
MCH: 26 pg (ref 26.0–34.0)
MCHC: 30.7 g/dL (ref 30.0–36.0)
MCV: 84.6 fL (ref 78.0–100.0)
Platelets: 69 10*3/uL — ABNORMAL LOW (ref 150–400)
RBC: 4.47 MIL/uL (ref 4.22–5.81)
RDW: 16.6 % — ABNORMAL HIGH (ref 11.5–15.5)
WBC: 3.9 10*3/uL — AB (ref 4.0–10.5)

## 2016-04-01 LAB — BASIC METABOLIC PANEL
ANION GAP: 8 (ref 5–15)
BUN: 10 mg/dL (ref 6–20)
CALCIUM: 9.5 mg/dL (ref 8.9–10.3)
CO2: 25 mmol/L (ref 22–32)
Chloride: 107 mmol/L (ref 101–111)
Creatinine, Ser: 0.89 mg/dL (ref 0.61–1.24)
Glucose, Bld: 116 mg/dL — ABNORMAL HIGH (ref 65–99)
POTASSIUM: 3.7 mmol/L (ref 3.5–5.1)
Sodium: 140 mmol/L (ref 135–145)

## 2016-04-01 LAB — LACTATE DEHYDROGENASE: LDH: 225 U/L — AB (ref 98–192)

## 2016-04-01 LAB — PROTIME-INR
INR: 1.59
Prothrombin Time: 19.1 seconds — ABNORMAL HIGH (ref 11.4–15.2)

## 2016-04-01 MED ORDER — SACUBITRIL-VALSARTAN 24-26 MG PO TABS: 1.0000 | ORAL_TABLET | Freq: Two times a day (BID) | ORAL | 0 refills | Status: AC

## 2016-04-01 NOTE — Progress Notes (Signed)
Patient presents for 6 week follow upin VAD Clinic today. Reports no problems with VAD equipment or concerns with drive line.  Vital Signs:  Doppler Pressure 88   Automatc BP: 98/57 (71) HR:41   SPO2:100  %  12 lead ekg obtained to confirm NSR with frequents PVC's  Weight: 200.6 lb w/o eqt Last weight: 198.2 lb Home weights: 195 lbs There is no height or weight on file to calculate BMI.   VAD Indication: Destination Therapy - age excluding  VAD interrogation & Equipment Management: Speed:9200 Flow: 5.2 Power:5.6 w    PI:6.1  Alarms: 1-2 every few days  Events:none  Fixed speed 9200 Low speed limit: 8600  Primary Controller:  Replace back up battery in 73months. Back up controller:   Replace back up battery in 22 months.  Annual Equipment Maintenance on UBC/PM was performed on 08/29/2016.   I reviewed the LVAD parameters from today and compared the results to the patient's prior recorded data. LVAD interrogation was NEGATIVE for significant power changes, NEGATIVE for clinical alarms and STABLE for PI events/speed drops. No programming changes were made and pump is functioning within specified parameters. Pt is performing daily controller and system monitor self tests along with completing weekly and monthly maintenance for LVAD equipment.  LVAD equipment check completed and is in good working order. Back-up equipment present. Charged back up battery and performed self-test on equipment.   Exit Site Care: Drive line is being maintained weekly by wife. Drive line exit site well healed and incorporated. The velour is fully implanted at exit site. Dressing dry and intact. No erythema or drainage. Stabilization device present and accurately applied. Pt denies fever or chills. Pt states they have adequate dressing supplies at home.   Eight weekly dressing kits given to wife.  Significant Events on VAD Support:  11/2015 >> Nosebleeds requiring packing x 2 (off  ASA)  Device:Medtronic CRT-D Therapies: on Last check: 02/2015   BP & Labs:  MAP 88 - Doppler is reflecting auto   Hgb 11.6 - No S/S of bleeding. Specifically denies melena/BRBPR or nosebleeds.  LDH stable at 225 with established baseline of 190- 230. Denies tea-colored urine. No power elevations noted on interrogation.    Marcellus Scott RN VAD Coordinator   Office: [phone] 24/7 Emergency VAD Pager: [phone]

## 2016-04-01 NOTE — Patient Instructions (Addendum)
Start Entresto 1 tablet twice a day. Pick up at [facility]. This drug is for your blood pressure. Be cautious with the lasix when starting this medication.  Wear compression stockings daily during waking hours.  We will call Dr Rayburn Ma for an appointment.  Return to clinic in 6 weeks for follow up.

## 2016-04-06 ENCOUNTER — Telehealth (HOSPITAL_COMMUNITY): Payer: Self-pay | Admitting: Pharmacist

## 2016-04-06 NOTE — Telephone Encounter (Signed)
Entresto 24-26 mg BID PA approved by [facility] Part D through 04/04/18.   Tyler Deis. Bonnye Fava, PharmD, BCPS, CPP Clinical Pharmacist Pager: [phone] Phone: [phone] 04/06/2016 11:14 AM

## 2016-04-07 ENCOUNTER — Encounter (HOSPITAL_COMMUNITY): Payer: Self-pay

## 2016-04-07 ENCOUNTER — Other Ambulatory Visit: Payer: Self-pay | Admitting: Internal Medicine

## 2016-04-07 LAB — PROTIME-INR: INR: 1.6 — AB (ref <=1.1)

## 2016-04-07 NOTE — Progress Notes (Signed)
Disability form completed for patient, copied into electronic medical record, original mailed to patient per patient request.  Ave Filter, RN

## 2016-04-08 ENCOUNTER — Ambulatory Visit (HOSPITAL_COMMUNITY): Payer: Self-pay | Admitting: Pharmacist

## 2016-04-08 DIAGNOSIS — Z95811 Presence of heart assist device: Secondary | ICD-10-CM

## 2016-04-08 LAB — BASIC METABOLIC PANEL
BUN/Creatinine Ratio: 11 (ref 10–24)
BUN: 10 mg/dL (ref 8–27)
CALCIUM: 8.9 mg/dL (ref 8.6–10.2)
CHLORIDE: 103 mmol/L (ref 96–106)
CO2: 26 mmol/L (ref 18–29)
Creatinine, Ser: 0.94 mg/dL (ref 0.76–1.27)
GFR calc non Af Amer: 80 mL/min/{1.73_m2} (ref >59)
GFR, EST AFRICAN AMERICAN: 92 mL/min/{1.73_m2} (ref >59)
Glucose: 97 mg/dL (ref 65–99)
Potassium: 4.2 mmol/L (ref 3.5–5.2)
Sodium: 143 mmol/L (ref 134–144)

## 2016-04-08 LAB — PROTIME-INR
INR: 1.6 — ABNORMAL HIGH (ref 0.8–1.2)
PROTHROMBIN TIME: 16.6 s — AB (ref 9.1–12.0)

## 2016-04-08 MED ORDER — ENOXAPARIN SODIUM 40 MG/0.4ML ~~LOC~~ SOLN: 40.0000 mg | Freq: Two times a day (BID) | SUBCUTANEOUS | 1 refills | Status: AC

## 2016-04-08 MED ORDER — WARFARIN SODIUM 3 MG PO TABS: 6.0000 mg | ORAL_TABLET | Freq: Every day | ORAL | 11 refills | Status: AC

## 2016-04-09 ENCOUNTER — Ambulatory Visit (INDEPENDENT_AMBULATORY_CARE_PROVIDER_SITE_OTHER): Payer: Medicare Other | Admitting: Orthopaedic Surgery

## 2016-04-09 ENCOUNTER — Ambulatory Visit (INDEPENDENT_AMBULATORY_CARE_PROVIDER_SITE_OTHER): Payer: Medicare Other | Admitting: Family

## 2016-04-09 ENCOUNTER — Ambulatory Visit (INDEPENDENT_AMBULATORY_CARE_PROVIDER_SITE_OTHER): Payer: Medicare Other

## 2016-04-09 ENCOUNTER — Ambulatory Visit (INDEPENDENT_AMBULATORY_CARE_PROVIDER_SITE_OTHER): Payer: Self-pay

## 2016-04-09 DIAGNOSIS — G8929 Other chronic pain: Secondary | ICD-10-CM

## 2016-04-09 DIAGNOSIS — M25561 Pain in right knee: Secondary | ICD-10-CM

## 2016-04-09 DIAGNOSIS — M25562 Pain in left knee: Secondary | ICD-10-CM | POA: Diagnosis not present

## 2016-04-09 DIAGNOSIS — B351 Tinea unguium: Secondary | ICD-10-CM | POA: Diagnosis not present

## 2016-04-09 MED ORDER — METHYLPREDNISOLONE ACETATE 40 MG/ML IJ SUSP
40.0000 mg | INTRAMUSCULAR | Status: AC | PRN
  Administered 2016-04-09: 40 mg via INTRA_ARTICULAR

## 2016-04-09 MED ORDER — LIDOCAINE HCL 1 % IJ SOLN
3.0000 mL | INTRAMUSCULAR | Status: AC | PRN
  Administered 2016-04-09: 3 mL

## 2016-04-09 NOTE — Progress Notes (Signed)
Office Visit Note   Patient: [name]           Date of Birth: [birth year]           MRN: [id number] Visit Date: 04/09/2016              Requested by: Richardean Chimera, MD [address] [place], [place] [address] PCP: Donzetta Sprung, MD  Chief Complaint  Patient presents with  . Right Foot - Nail Problem  . Left Foot - Nail Problem    HPI: Patient is a 76 year old gentleman who is seen today for evaluation of bilateral toenail fungus. Needs bilateral nail trim. He is a patient well known to Dr. Magnus Ivan.    Assessment & Plan: Visit Diagnoses:  1. Onychomycosis     Plan: Him follow-up in office in 3 more months for bilateral foot check and nail trim.  Follow-Up Instructions: Return in about 3 months (around 07/07/2016).   Ortho Exam Physical Exam  Constitutional: Appears well-developed.  Head: Normocephalic.  Eyes: EOM are normal.  Neck: Normal range of motion.  Cardiovascular: Normal rate.   Pulmonary/Chest: Effort normal.  Neurological: Is alert.  Skin: Skin is warm.  Psychiatric: Has a normal mood and affect. bilateral feet with trace edema. Brawny skin color changes to bilateral lower extremities. Nails are thickened and discolored onychomycotic 10. Unable to safely trim his own nails. These were trimmed today without incident.  Imaging: Xr Knee 1-2 Views Left  Result Date: 04/09/2016 An AP and lateral of his left knee shows track more arthritic changes. There is a small varus deformity and malalignment. It is difficult track Murrell arthritis.  Xr Knee 1-2 Views Right  Result Date: 04/09/2016 P and lateral of his right knee shows tricompartmental arthritis. There still some joint space remaining but there is definitely calcifications rather meniscus. There is a slight varus malalignment as well.   Orders:  No orders of the defined types were placed in this encounter.  No orders of the defined types were placed in this encounter.    Procedures: No procedures  performed  Clinical Data: No additional findings.  Subjective: Review of Systems  Constitutional: Negative for chills and fever.    Objective: Vital Signs: There were no vitals taken for this visit.  Specialty Comments:  No specialty comments available.  PMFS History: Patient Active Problem List   Diagnosis Date Noted  . Onychomycosis 04/09/2016  . Cognitive impairment 01/10/2016  . Arthritis of both knees 12/31/2015  . Volume depletion   . Presence of left ventricular assist device (LVAD) (HCC)   . Chronic systolic CHF (congestive heart failure) (HCC)   . Physical deconditioning 09/19/2015  . 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 Debilitated 09/19/2015  . Other congestive heart failure (HCC)   . Encephalopathy   . H/O chest tube placement   . LBBB (left bundle branch block)   . Generalized OA   . Prediabetes   . Acute delirium   . LVAD (left ventricular assist device) present (HCC)   . Insomnia   . Protein-calorie malnutrition, severe 08/23/2015  . Palliative care encounter   . Goals of care, counseling/discussion   . Acute on chronic systolic (congestive) heart failure 08/21/2015  . Acute on chronic systolic CHF (congestive heart failure) (HCC) 08/21/2015  . PVC (premature ventricular contraction) 01/04/2015  . 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 Symptomatic bradycardia 11/28/2012  . 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 Chronic systolic heart failure (HCC) 10/27/2012  . 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 Left bundle branch block 10/06/2012  . Syncope 10/06/2012   Past Medical History:  Diagnosis Date  . Automatic implantable cardioverter-defibrillator in situ   . GERD (gastroesophageal reflux disease)   . H/O hiatal hernia   . Insomnia   . Nephrolithiasis   . Nonischemic cardiomyopathy (HCC)    Cardiac catheterization 12/2011 with LVEF 45%, no significant obstructive CAD  . Osteoarthritis   . Pneumonia    Dec 2016      Family History  Problem Relation Age of Onset  . Heart failure Mother   . Stroke Mother   . Asthma Mother   . Heart disease Father   . Heart failure Father   . Congestive Heart Failure Brother     @ 62 Heart transplant, [place]  . Congestive Heart Failure Brother     ICD  . Heart failure Sister     ICD  . Cancer - Ovarian Sister     deceased @ 42  . Cancer - Prostate Maternal Uncle     Past Surgical History:  Procedure Laterality Date  . APPENDECTOMY    . BI-VENTRICULAR IMPLANTABLE CARDIOVERTER DEFIBRILLATOR N/A 12/14/2012   Procedure: BI-VENTRICULAR IMPLANTABLE CARDIOVERTER DEFIBRILLATOR  (CRT-D);  Surgeon: Duke Salvia, MD;  Location: [facility] CATH LAB;  Service: Cardiovascular;  Laterality: N/A;  . BI-VENTRICULAR IMPLANTABLE CARDIOVERTER DEFIBRILLATOR  (CRT-D)  12/14/2012  . CARDIAC CATHETERIZATION  2003?; ~ 11/2012  . CARDIAC CATHETERIZATION N/A 08/22/2015   Procedure: Right/Left Heart Cath and Coronary Angiography;  Surgeon: Dolores Patty, MD;  Location: [facility] INVASIVE CV LAB;  Service: Cardiovascular;  Laterality: N/A;  . CATARACT EXTRACTION W/PHACO Left 05/13/2015   Procedure: CATARACT EXTRACTION PHACO AND INTRAOCULAR LENS PLACEMENT LEFT EYE;  CDE:  3.49;  Surgeon: Susa Simmonds, MD;  Location: [facility] ORS;  Service: Ophthalmology;  Laterality: Left;  . CATARACT EXTRACTION W/PHACO Right 07/22/2015   Procedure: CATARACT EXTRACTION PHACO AND INTRAOCULAR LENS PLACEMENT RIGHT EYE CDE=4.50;  Surgeon: Susa Simmonds, MD;  Location: [facility] ORS;  Service: Ophthalmology;  Laterality: Right;  . CYSTOSCOPY W/ STONE MANIPULATION     "3-4 times" (12/14/2012)  . EXPLORATORY LAPAROTOMY  2006?  . INSERTION OF IMPLANTABLE LEFT VENTRICULAR ASSIST DEVICE N/A 08/30/2015   Procedure: INSERTION OF IMPLANTABLE LEFT VENTRICULAR ASSIST DEVICE;  Surgeon: Kerin Perna, MD;  Location: [facility] OR;  Service: Open Heart Surgery;  Laterality: N/A;  . INTRAOPERATIVE TRANSESOPHAGEAL ECHOCARDIOGRAM N/A 08/30/2015   Procedure:  INTRAOPERATIVE TRANSESOPHAGEAL ECHOCARDIOGRAM;  Surgeon: Kerin Perna, MD;  Location: [facility] OR;  Service: Open Heart Surgery;  Laterality: N/A;  . KNEE ARTHROSCOPY Left 1990's  . LITHOTRIPSY     "twice" (12/14/2012)  . POSTERIOR FUSION CERVICAL SPINE  2001  . VOLVULUS REDUCTION  2007   Social History   Occupational History  . Not on file.   Social History Main Topics  . Smoking status: Never Smoker  . Smokeless tobacco: Former Neurosurgeon  . Alcohol use No  . Drug use: No  . Sexual activity: Yes

## 2016-04-09 NOTE — Progress Notes (Signed)
Office Visit Note   Patient: [name]           Date of Birth: 01-03-1941           MRN: [id number] Visit Date: 04/09/2016              Requested by: Richardean Chimera, MD [address] [place], [place] [address] PCP: Donzetta Sprung, MD   Assessment & Plan: Visit Diagnoses:  1. Chronic pain of left knee   2. Chronic pain of right knee     Plan: He tolerated the steroid injections well both his knees. Now were going to order hyaluronic acid to try to help alleviate the chronic pain aspect of his knee arthritis. We will order these injections and we'll see him back in the office in 4 weeks and placed the hyaluronic acid injections in both knees. I told him and his wife about this in detail.  Follow-Up Instructions: Return in about 4 weeks (around 05/07/2016).   Orders:  Orders Placed This Encounter  Procedures  . Large Joint Injection/Arthrocentesis  . Large Joint Injection/Arthrocentesis  . XR Knee 1-2 Views Left  . XR Knee 1-2 Views Right   No orders of the defined types were placed in this encounter.     Procedures: Large Joint Inj Date/Time: 04/09/2016 1:59 PM Performed by: Kathryne Hitch Authorized by: Doneen Poisson Y   Location:  Knee Site:  R knee Ultrasound Guidance: No   Fluoroscopic Guidance: No   Arthrogram: No   Medications:  3 mL lidocaine 1 %; 40 mg methylPREDNISolone acetate 40 MG/ML Large Joint Inj Date/Time: 04/09/2016 2:00 PM Performed by: Kathryne Hitch Authorized by: Kathryne Hitch   Location:  Knee Site:  L knee Ultrasound Guidance: No   Fluoroscopic Guidance: No   Arthrogram: No   Medications:  3 mL lidocaine 1 %; 40 mg methylPREDNISolone acetate 40 MG/ML     Clinical Data: No additional findings.   Subjective: No chief complaint on file. The patient is well-known to me. He is a 76 year old gentleman with bilateral knee pain with left worse than the right. He definitely has a history of arthritis in his  knees. He also has a history of a left ventricular assist device for his heart and he cannot have surgery at the moment. We are trying to temporize his pain with injections. Last, place injections were in October he tolerated these but they wore off quickly.  HPI  Review of Systems   Objective: Vital Signs: There were no vitals taken for this visit.  Physical Exam  Ortho Exam Both knees are examined today. Both have a slight varus malalignment. Both knees have patellofemoral crepitation and medial joint line tenderness. There is a mild effusion of both knees. His range of motion is full and they both feel ligamentously stable. Specialty Comments:  No specialty comments available.  Imaging: Xr Knee 1-2 Views Left  Result Date: 04/09/2016 An AP and lateral of his left knee shows track more arthritic changes. There is a small varus deformity and malalignment. It is difficult track Murrell arthritis.  Xr Knee 1-2 Views Right  Result Date: 04/09/2016 P and lateral of his right knee shows tricompartmental arthritis. There still some joint space remaining but there is definitely calcifications rather meniscus. There is a slight varus malalignment as well.    PMFS History: Patient Active Problem List   Diagnosis Date Noted  . Cognitive impairment 01/10/2016  . Arthritis of both knees 12/31/2015  .  Volume depletion   . Presence of left ventricular assist device (LVAD) (HCC)   . Chronic systolic CHF (congestive heart failure) (HCC)   . Physical deconditioning 09/19/2015  . 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 Debilitated 09/19/2015  . Other congestive heart failure (HCC)   . Encephalopathy   . H/O chest tube placement   . LBBB (left bundle branch block)   . Generalized OA   . Prediabetes   . Acute delirium   . LVAD (left ventricular assist device) present (HCC)   . Insomnia   . Protein-calorie malnutrition, severe 08/23/2015  . Palliative care encounter   . Goals of  care, counseling/discussion   . Acute on chronic systolic (congestive) heart failure 08/21/2015  . Acute on chronic systolic CHF (congestive heart failure) (HCC) 08/21/2015  . PVC (premature ventricular contraction) 01/04/2015  . 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 Symptomatic bradycardia 11/28/2012  . 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 Chronic systolic heart failure (HCC) 10/27/2012  . 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 Left bundle branch block 10/06/2012  . Syncope 10/06/2012   Past Medical History:  Diagnosis Date  . Automatic implantable cardioverter-defibrillator in situ   . GERD (gastroesophageal reflux disease)   . H/O hiatal hernia   . Insomnia   . Nephrolithiasis   . Nonischemic cardiomyopathy (HCC)    Cardiac catheterization 12/2011 with LVEF 45%, no significant obstructive CAD  . Osteoarthritis   . Pneumonia    Dec 2016    Family History  Problem Relation Age of Onset  . Heart failure Mother   . Stroke Mother   . Asthma Mother   . Heart disease Father   . Heart failure Father   . Congestive Heart Failure Brother     @ 62 Heart transplant, [place]  . Congestive Heart Failure Brother     ICD  . Heart failure Sister     ICD  . Cancer - Ovarian Sister     deceased @ 83  . Cancer - Prostate Maternal Uncle     Past Surgical History:  Procedure Laterality Date  . APPENDECTOMY    . BI-VENTRICULAR IMPLANTABLE CARDIOVERTER DEFIBRILLATOR N/A 12/14/2012   Procedure: BI-VENTRICULAR IMPLANTABLE CARDIOVERTER DEFIBRILLATOR  (CRT-D);  Surgeon: Duke Salvia, MD;  Location: [facility] CATH LAB;  Service: Cardiovascular;  Laterality: N/A;  . BI-VENTRICULAR IMPLANTABLE CARDIOVERTER DEFIBRILLATOR  (CRT-D)  12/14/2012  . CARDIAC CATHETERIZATION  2003?; ~ 11/2012  . CARDIAC CATHETERIZATION N/A 08/22/2015   Procedure: Right/Left Heart Cath and Coronary Angiography;  Surgeon: Dolores Patty, MD;  Location: [facility] INVASIVE CV LAB;  Service: Cardiovascular;  Laterality: N/A;   . CATARACT EXTRACTION W/PHACO Left 05/13/2015   Procedure: CATARACT EXTRACTION PHACO AND INTRAOCULAR LENS PLACEMENT LEFT EYE;  CDE:  3.49;  Surgeon: Susa Simmonds, MD;  Location: [facility] ORS;  Service: Ophthalmology;  Laterality: Left;  . CATARACT EXTRACTION W/PHACO Right 07/22/2015   Procedure: CATARACT EXTRACTION PHACO AND INTRAOCULAR LENS PLACEMENT RIGHT EYE CDE=4.50;  Surgeon: Susa Simmonds, MD;  Location: [facility] ORS;  Service: Ophthalmology;  Laterality: Right;  . CYSTOSCOPY W/ STONE MANIPULATION     "3-4 times" (12/14/2012)  . EXPLORATORY LAPAROTOMY  2006?  . INSERTION OF IMPLANTABLE LEFT VENTRICULAR ASSIST DEVICE N/A 08/30/2015   Procedure: INSERTION OF IMPLANTABLE LEFT VENTRICULAR ASSIST DEVICE;  Surgeon: Kerin Perna, MD;  Location: [facility] OR;  Service: Open Heart Surgery;  Laterality: N/A;  . INTRAOPERATIVE TRANSESOPHAGEAL ECHOCARDIOGRAM N/A 08/30/2015   Procedure: INTRAOPERATIVE TRANSESOPHAGEAL ECHOCARDIOGRAM;  Surgeon: Kathlee Nations Trigt,  MD;  Location: [facility] OR;  Service: Open Heart Surgery;  Laterality: N/A;  . KNEE ARTHROSCOPY Left 1990's  . LITHOTRIPSY     "twice" (12/14/2012)  . POSTERIOR FUSION CERVICAL SPINE  2001  . VOLVULUS REDUCTION  2007   Social History   Occupational History  . Not on file.   Social History Main Topics  . Smoking status: Never Smoker  . Smokeless tobacco: Former Neurosurgeon  . Alcohol use No  . Drug use: No  . Sexual activity: Yes

## 2016-04-10 ENCOUNTER — Ambulatory Visit (INDEPENDENT_AMBULATORY_CARE_PROVIDER_SITE_OTHER): Payer: Medicare Other | Admitting: Neurology

## 2016-04-10 ENCOUNTER — Ambulatory Visit (INDEPENDENT_AMBULATORY_CARE_PROVIDER_SITE_OTHER): Payer: Medicare Other | Admitting: Orthopedic Surgery

## 2016-04-10 ENCOUNTER — Encounter: Payer: Self-pay | Admitting: Neurology

## 2016-04-10 VITALS — BP 108/70 | HR 95 | Ht 75.0 in | Wt 200.4 lb

## 2016-04-10 DIAGNOSIS — R4189 Other symptoms and signs involving cognitive functions and awareness: Secondary | ICD-10-CM | POA: Diagnosis not present

## 2016-04-10 MED ORDER — CITALOPRAM HYDROBROMIDE 10 MG PO TABS: 10.0000 mg | ORAL_TABLET | Freq: Every day | ORAL | 3 refills | Status: AC

## 2016-04-10 NOTE — Progress Notes (Signed)
NEUROLOGY FOLLOW UP OFFICE NOTE  [name] [id number]  HISTORY OF PRESENT ILLNESS: I had the pleasure of seeing [name] in follow-up in the neurology clinic on 04/10/2016.  The patient was last seen 3 months ago for cognitive changes while in the hospital for LVAD in June 2017. On his initial visit, his MOCA scare was 16/30, however he became very irritated and upset, refusing to do parts of the test, saying "I'm just stupid." Records and images were personally reviewed where available.  I personally reviewed head CT without contrast (patient unable to do MRI due to LVAD), there was generalized atrophy and moderate chronic microvascular disease. TSH and B12 normal. Citalopram was prescribed on his last visit, but he did not start it. Since his last visit, he is calmer in the office today but his wife shakes her head beside him when asked questions. She administers all his medications. He states we are "killing him" with all his medications. Sleep is good. Overall memory is unchanged, but potentially there has been some improvement in mood. His wife has difficulty speaking in front of him during the visit.   He denies any headaches, dizziness, diplopia, dysarthria, dysphagia, neck/back pain, focal numbness/tingling/weakness, bowel/bladder dysfunction. No anosmia, tremors, no falls.   HPI 01/09/2016:  This is a 76 yo RH man with a history of cardiomyopathy and left bundle branch block, who underwent LVAD in June 2017. Records were reviewed, patient had confusion post-op, with note of moderately severe cognitive impairment with decreased orientation to date and situation, decreased sustained attention to task, and poor intellectual awareness of deficits. He had previously been working full time and independent prior to admission. He underwent speech therapy and his symptoms have slowly improved but still not at baseline. His wife reports that "everything has gotten better," particularly in  the past 2-3 weeks. He reports his memory is not as good as it used to be. His wife reports difficulty remembering things. When they first got home, he had to re-learn all their light fixtures. He has turned around completely with this the past month. Her main concern is that he cannot seem to comprehend his LVAD, she tells him over and over again how to do it and does not understand why it is so difficult to learn for him. He apparently can follow other instructions pretty well. He reports he is afraid to do it, because he states he only has 15 minutes and is afraid of making a mistake. He started driving last month, overall doing well, except that he occasionally makes a wrong turn but immediately realizes it and can find his way back. She states this is a change from his baseline where he was very good with directions. Now he has to think harder. They report that he was not taking any medications prior to his illness, and now after hospitalization his wife is in charge of his medications. She has always been in charge of bills. She has noticed personality changes, and is noted to have some difficulty saying this in front of him during the visit today. She does report that he is "real hot-tempered and impatient" now. He states the reason behind this is he does not like people telling him what to do. His wife states he gets upset when he is reminded to do something. He pulls off his belts anywhere and cannot find them later. He has some paranoia that people think he is stupid, for instance when he was getting therapy and  was asked questions, he felt that people thought he was stupid. He states the same thing today as we do memory testing. No difficulties with ADLs. He denies any significant head injuries. No family history of dementia. No alcohol use.   PAST MEDICAL HISTORY: Past Medical History:  Diagnosis Date  . Automatic implantable cardioverter-defibrillator in situ   . GERD (gastroesophageal reflux  disease)   . H/O hiatal hernia   . Insomnia   . Nephrolithiasis   . Nonischemic cardiomyopathy (HCC)    Cardiac catheterization 12/2011 with LVEF 45%, no significant obstructive CAD  . Osteoarthritis   . Pneumonia    Dec 2016    MEDICATIONS:   Current Outpatient Prescriptions on File Prior to Visit  Medication Sig Dispense Refill  . acetaminophen (TYLENOL) 325 MG tablet Take 2 tablets (650 mg total) by mouth every 4 (four) hours as needed for mild pain or moderate pain.    . calcium carbonate (OSCAL) 1500 (600 Ca) MG TABS tablet Take by mouth daily.    Marland Kitchen Cod Liver Oil 1000 MG CAPS Take by mouth 3 (three) times a week.     . enoxaparin (LOVENOX) 40 MG/0.4ML injection Inject 0.4 mLs (40 mg total) into the skin every 12 (twelve) hours. 10 Syringe 1  . ferrous fumarate-b12-vitamic C-folic acid (TRINSICON / FOLTRIN) capsule Take 1 capsule by mouth 3 (three) times daily after meals. (Patient taking differently: Take 1 capsule by mouth daily with breakfast. ) 90 capsule 0  . furosemide (LASIX) 20 MG tablet Take 2 tablets (40 mg total) by mouth daily. 60 tablet 3  . Glucosamine Sulfate 1000 MG CAPS Take by mouth daily.    . Multiple Vitamin (MULTIVITAMIN) tablet Take 1 tablet by mouth daily.    . pantoprazole (PROTONIX) 40 MG tablet Take 1 tablet (40 mg total) by mouth daily. 30 tablet 0  . potassium chloride SA (K-DUR,KLOR-CON) 20 MEQ tablet Take 2 tablets (40 mEq total) by mouth daily. 90 tablet 3  . sacubitril-valsartan (ENTRESTO) 24-26 MG Take 1 tablet by mouth 2 (two) times daily. 60 tablet 0  . warfarin (COUMADIN) 3 MG tablet Take 2 tablets (6 mg total) by mouth daily. 30 tablet 11  . amoxicillin (AMOXIL) 500 MG capsule Take 4 capsules (2,000 mg total) by mouth once as needed (Take 30-60 minutes before dental cleaning/procedure). (Patient not taking: Reported on 02/27/2016) 12 capsule 0   No current facility-administered medications on file prior to visit.     ALLERGIES: Allergies    Allergen Reactions  . Phenergan [Promethazine Hcl] Nausea And Vomiting  . Lasix [Furosemide] Other (See Comments)    "Dropped blood pressure too low"  . Morphine And Related Other (See Comments)    Goes crazy     FAMILY HISTORY: Family History  Problem Relation Age of Onset  . Heart failure Mother   . Stroke Mother   . Asthma Mother   . Heart disease Father   . Heart failure Father   . Congestive Heart Failure Brother     @ 62 Heart transplant, [place]  . Congestive Heart Failure Brother     ICD  . Heart failure Sister     ICD  . Cancer - Ovarian Sister     deceased @ 33  . Cancer - Prostate Maternal Uncle     SOCIAL HISTORY: Social History   Social History  . Marital status: Married    Spouse name: N/A  . Number of children: N/A  . Years of education:  N/A   Occupational History  . Not on file.   Social History Main Topics  . Smoking status: Never Smoker  . Smokeless tobacco: Former Neurosurgeon  . Alcohol use No  . Drug use: No  . Sexual activity: Yes   Other Topics Concern  . 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 Married 51 years and live in [place], [place].    REVIEW OF SYSTEMS: Constitutional: No fevers, chills, or sweats, no generalized fatigue, change in appetite Eyes: No visual changes, double vision, eye pain Ear, nose and throat: No hearing loss, ear pain, nasal congestion, sore throat Cardiovascular: No chest pain, palpitations Respiratory:  No shortness of breath at rest or with exertion, wheezes GastrointestinaI: No nausea, vomiting, diarrhea, abdominal pain, fecal incontinence Genitourinary:  No dysuria, urinary retention or frequency Musculoskeletal:  No neck pain, back pain Integumentary: No rash, pruritus, skin lesions Neurological: as above Psychiatric: No depression, insomnia, anxiety Endocrine: No palpitations, fatigue, diaphoresis, mood swings, change in appetite, change in weight, increased  thirst Hematologic/Lymphatic:  No anemia, purpura, petechiae. Allergic/Immunologic: no itchy/runny eyes, nasal congestion, recent allergic reactions, rashes  PHYSICAL EXAM: Vitals:   04/10/16 1424  BP: 108/70  Pulse: 95   General: No acute distress Head:  Normocephalic/atraumatic Neck: supple, no paraspinal tenderness, full range of motion Heart:  Regular rate and rhythm Lungs:  Clear to auscultation bilaterally Back: No paraspinal tenderness Skin/Extremities: No rash, no edema Neurological Exam: alert and oriented to person, place, and time. No aphasia or dysarthria. Fund of knowledge is appropriate.  Remote memory intact. 0/3 delayed recall. Attention and concentration are normal.    Able to name objects and repeat phrases. Cranial nerves: Pupils equal, round, reactive to light.  Extraocular movements intact with no nystagmus. Visual fields full. Facial sensation intact. No facial asymmetry. Tongue, uvula, palate midline.  Motor: Bulk and tone normal, muscle strength 5/5 throughout with no pronator drift.  Sensation to light touch intact.  No extinction to double simultaneous stimulation.  Deep tendon reflexes 2+ throughout, toes downgoing.  Finger to nose testing intact.  Gait narrow-based and steady, able to tandem walk adequately.  Romberg negative.  IMPRESSION: This is a 76 yo RH man with a history of cardiomyopathy and left bundle branch block, who underwent LVAD in June 2017. He had confusion post-op, with note of moderately severe cognitive impairment while in the hospital. Cognition has improved, however he continues to have cognitive changes with MOCA score of 16/30 last November 2017. He however had irritability and frustration and got upset doing parts of the test. Cognitive changes can occur after LVAD, sometimes due to stroke or subclinical cerebral ischemia, head CT did not show any acute changes, he is unable to do an MRI. He did not start the citalopram on his last visit. He  appears more calm today, but it is difficult to say as his wife appears to be cautious to say something in front of him. She is asking about starting Aricept, we discussed expectations from the medication, this does not stop or prevent memory loss, but can potentially stabilize or slow down for 6-12 months. She would like to start the citalopram first and see how he does. She will call our office once she would like to try the Aricept, we discussed side effects of medications. We again discussed the importance of control of vascular risk factors, physical exercise, and brain stimulation exercises for brain health. He will  follow-up in 6 months and knows to call for any changes.   Thank you for allowing me to participate in his care.  Please do not hesitate to call for any questions or concerns.  The duration of this appointment visit was 25 minutes of face-to-face time with the patient.  Greater than 50% of this time was spent in counseling, explanation of diagnosis, planning of further management, and coordination of care.   Patrcia Dolly, M.D.   CC: Dr. Reuel Boom, Dr. Gala Romney

## 2016-04-10 NOTE — Patient Instructions (Signed)
1. Start taking citalopram 10mg  daily 2. Call our office anytime when you would like to start the Aricept. It will be a 10mg  tablet, take 1/2 tablet daily for the first month (monitor for diarrhea), then increase to 1 tablet daily 3. Control of blood pressure, cholesterol, as well as physical exercise and brain stimulation exercises are important for brain health 4. Follow-up in 6 months, call for any changes

## 2016-04-12 NOTE — Progress Notes (Signed)
LVAD CLINIC NOTE  HPI: Mr. Bufalini is a 76 y/o male with h/o chronic systolic HF due to LMNA cardiomyopathy and frequent PVCs who underwent HM-II VAD placement on 6/23.   Post-op course c/b delirium/sundowning, urinary retention and deconditioning. Was eventually transferred to CIR.   Follow up for Heart Failure/LVAD: Returns for routine f/u. Continues to feel good. Not walking as much due to knee pain. Denies CP or SOB. Not watching diet at all according to wife. Taking lasix as needed - most days of the week. Still with some edema. He is now off ASA due to epistaxis in 9/17 requiting packing.  MAP is elevated.  Continue to struggle with early dementia. He seems unaware of the severity of the deficit but wife remains frustrated  Seen by Neurology and scored 16/30 on MSSE.   Denies LVAD alarms.  Denies driveline trauma, erythema or drainage.  Denies ICD shocks.   Reports taking Coumadin as prescribed and adherence to anticoagulation based dietary restrictions.  Denies bright red blood per rectum or melena, no dark urine or hematuria. No focal. neuro sx.   VAD Indication: Destination Therapy - age excluding  VAD interrogation & Equipment Management: Speed:9200 Flow: 5.2 Power:5.6 w PI:6.1  Alarms: 1-2 every few days     Events:none  Fixed speed 9200 Low speed limit: 8600  Primary Controller: Replace back up battery in 70months. Back up controller: Replace back up battery in 25months.  Past Medical History:  Diagnosis Date  . Automatic implantable cardioverter-defibrillator in situ   . GERD (gastroesophageal reflux disease)   . H/O hiatal hernia   . Insomnia   . Nephrolithiasis   . Nonischemic cardiomyopathy (HCC)    Cardiac catheterization 12/2011 with LVEF 45%, no significant obstructive CAD  . Osteoarthritis   . Pneumonia    Dec 2016    Current Outpatient Prescriptions  Medication Sig Dispense Refill  . acetaminophen (TYLENOL) 325 MG tablet Take 2  tablets (650 mg total) by mouth every 4 (four) hours as needed for mild pain or moderate pain.    . calcium carbonate (OSCAL) 1500 (600 Ca) MG TABS tablet Take by mouth daily.    Marland Kitchen Cod Liver Oil 1000 MG CAPS Take by mouth 3 (three) times a week.     . ferrous fumarate-b12-vitamic C-folic acid (TRINSICON / FOLTRIN) capsule Take 1 capsule by mouth 3 (three) times daily after meals. (Patient taking differently: Take 1 capsule by mouth daily with breakfast. ) 90 capsule 0  . furosemide (LASIX) 20 MG tablet Take 2 tablets (40 mg total) by mouth daily. 60 tablet 3  . Glucosamine Sulfate 1000 MG CAPS Take by mouth daily.    . Multiple Vitamin (MULTIVITAMIN) tablet Take 1 tablet by mouth daily.    . pantoprazole (PROTONIX) 40 MG tablet Take 1 tablet (40 mg total) by mouth daily. 30 tablet 0  . potassium chloride SA (K-DUR,KLOR-CON) 20 MEQ tablet Take 2 tablets (40 mEq total) by mouth daily. 90 tablet 3  . amoxicillin (AMOXIL) 500 MG capsule Take 4 capsules (2,000 mg total) by mouth once as needed (Take 30-60 minutes before dental cleaning/procedure). (Patient not taking: Reported on 02/27/2016) 12 capsule 0  . citalopram (CELEXA) 10 MG tablet Take 1 tablet (10 mg total) by mouth daily. 90 tablet 3  . enoxaparin (LOVENOX) 40 MG/0.4ML injection Inject 0.4 mLs (40 mg total) into the skin every 12 (twelve) hours. 10 Syringe 1  . sacubitril-valsartan (ENTRESTO) 24-26 MG Take 1 tablet by mouth 2 (  two) times daily. 60 tablet 0  . warfarin (COUMADIN) 3 MG tablet Take 2 tablets (6 mg total) by mouth daily. 30 tablet 11   No current facility-administered medications for this encounter.     Phenergan [promethazine hcl]; Lasix [furosemide]; and Morphine and related  REVIEW OF SYSTEMS: All systems negative except as listed in HPI, PMH and Problem list.  Vital Signs:  Doppler Pressure 88                Automatc BP: 98/57 (71) HR:41  SPO2:100  %   Weight: 200.6 lb w/o eqt Last weight: 198.2 lb Home  weights: 195 lbs   Physical Exam: GENERAL: Well appearing, male who presents to clinic today in no acute distress. Ambulated in the clinic without difficulty HEENT: normal NECK: Supple, JVP 8,  2+ bilaterally, no bruits.  No lymphadenopathy or thyromegaly appreciated.  CARDIAC:  Mechanical heart sounds with LVAD hum present.  LUNGS:  Clear to auscultation bilaterally.  ABDOMEN:  Soft, round, nontender, positive bowel sounds x4.     LVAD exit site: well-healed and incorporated.   Stabilization device present and accurately applied.  Driveline dressing is being changed daily per sterile technique. EXTREMITIES:  Warm and dry, no cyanosis, clubbing, rash. 1+ edema  NEUROLOGIC:  Alert and oriented.     ASSESSMENT AND PLAN:   1. 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 EF 15-20%. S/p HM-II LVAD 08/30/15 --NYHA I-II.  Limited by knee pain. -> Refer to Dr. Magnus Ivan in Ortho - Not watching diet at all. Volume status mildly elevated.  - MAP elevated and volume status up. Will start Entresto 24/26 - Wear compression stockings during the day. - Continue cardiac rehab.  - VAD parameters stable.  2. Frequent PVC: Off amiodarone, will follow.  3. Anticoagulation management: INR goal 2.0-3.0. INR 1.6, Discussed with PharmD, Off ASA with epistaxis.  4. Dementia - Unchanged. Recently has seen Neurology. MSSE 16/30. They have started low-dose SSRI. If no improvement will consider Aricept.  5. Knee pain - Refer to ortho for possible steroid injection.   Tekeisha Hakim,MD 04/12/2016

## 2016-04-13 ENCOUNTER — Other Ambulatory Visit: Payer: Self-pay | Admitting: Internal Medicine

## 2016-04-13 ENCOUNTER — Ambulatory Visit (HOSPITAL_COMMUNITY): Payer: Self-pay | Admitting: Pharmacist

## 2016-04-13 DIAGNOSIS — Z95811 Presence of heart assist device: Secondary | ICD-10-CM

## 2016-04-13 LAB — PROTIME-INR: INR: 2.1 — AB (ref <=1.1)

## 2016-04-14 LAB — COAGUCHEK XS/INR WAIVED
INR: 2.1 — AB (ref 0.9–1.1)
Prothrombin Time: 24.8 s

## 2016-04-15 ENCOUNTER — Other Ambulatory Visit: Payer: Self-pay | Admitting: Internal Medicine

## 2016-04-17 ENCOUNTER — Other Ambulatory Visit (HOSPITAL_COMMUNITY): Payer: Self-pay | Admitting: Internal Medicine

## 2016-04-20 ENCOUNTER — Ambulatory Visit (INDEPENDENT_AMBULATORY_CARE_PROVIDER_SITE_OTHER): Payer: Medicare Other | Admitting: Orthopaedic Surgery

## 2016-04-21 ENCOUNTER — Other Ambulatory Visit: Payer: Self-pay | Admitting: Internal Medicine

## 2016-04-21 ENCOUNTER — Ambulatory Visit (HOSPITAL_COMMUNITY): Payer: Self-pay | Admitting: Pharmacist

## 2016-04-21 DIAGNOSIS — Z95811 Presence of heart assist device: Secondary | ICD-10-CM

## 2016-04-21 LAB — COAGUCHEK XS/INR WAIVED
INR: 1.9 — ABNORMAL HIGH (ref 0.9–1.1)
Prothrombin Time: 22.7 s

## 2016-04-21 LAB — PROTIME-INR: INR: 1.9 — AB (ref <=1.1)

## 2016-04-23 ENCOUNTER — Telehealth: Payer: Self-pay | Admitting: Infectious Diseases

## 2016-04-23 ENCOUNTER — Telehealth (HOSPITAL_COMMUNITY): Payer: Self-pay | Admitting: *Deleted

## 2016-04-23 ENCOUNTER — Encounter (HOSPITAL_COMMUNITY): Payer: Self-pay | Admitting: *Deleted

## 2016-04-23 ENCOUNTER — Emergency Department (HOSPITAL_COMMUNITY): Payer: Medicare Other

## 2016-04-23 ENCOUNTER — Inpatient Hospital Stay (HOSPITAL_COMMUNITY)
Admission: EM | Admit: 2016-04-23 | Discharge: 2016-05-07 | DRG: 064 | Disposition: E | Payer: Medicare Other | Attending: Internal Medicine | Admitting: Internal Medicine

## 2016-04-23 ENCOUNTER — Other Ambulatory Visit: Payer: Self-pay

## 2016-04-23 DIAGNOSIS — S065X9A Traumatic subdural hemorrhage with loss of consciousness of unspecified duration, initial encounter: Secondary | ICD-10-CM

## 2016-04-23 DIAGNOSIS — I459 Conduction disorder, unspecified: Secondary | ICD-10-CM | POA: Diagnosis present

## 2016-04-23 DIAGNOSIS — S065XAA Traumatic subdural hemorrhage with loss of consciousness status unknown, initial encounter: Secondary | ICD-10-CM

## 2016-04-23 DIAGNOSIS — H5704 Mydriasis: Secondary | ICD-10-CM | POA: Diagnosis present

## 2016-04-23 DIAGNOSIS — R4182 Altered mental status, unspecified: Secondary | ICD-10-CM | POA: Diagnosis present

## 2016-04-23 DIAGNOSIS — I638 Other cerebral infarction: Secondary | ICD-10-CM | POA: Diagnosis present

## 2016-04-23 DIAGNOSIS — Z515 Encounter for palliative care: Secondary | ICD-10-CM | POA: Diagnosis present

## 2016-04-23 DIAGNOSIS — R402312 Coma scale, best motor response, none, at arrival to emergency department: Secondary | ICD-10-CM | POA: Diagnosis present

## 2016-04-23 DIAGNOSIS — I6201 Nontraumatic acute subdural hemorrhage: Secondary | ICD-10-CM | POA: Diagnosis present

## 2016-04-23 DIAGNOSIS — Z888 Allergy status to other drugs, medicaments and biological substances status: Secondary | ICD-10-CM | POA: Diagnosis not present

## 2016-04-23 DIAGNOSIS — I5022 Chronic systolic (congestive) heart failure: Secondary | ICD-10-CM | POA: Diagnosis present

## 2016-04-23 DIAGNOSIS — Z885 Allergy status to narcotic agent status: Secondary | ICD-10-CM | POA: Diagnosis not present

## 2016-04-23 DIAGNOSIS — J9601 Acute respiratory failure with hypoxia: Secondary | ICD-10-CM | POA: Diagnosis present

## 2016-04-23 DIAGNOSIS — S06350A Traumatic hemorrhage of left cerebrum without loss of consciousness, initial encounter: Secondary | ICD-10-CM

## 2016-04-23 DIAGNOSIS — Z95811 Presence of heart assist device: Secondary | ICD-10-CM

## 2016-04-23 DIAGNOSIS — S06320A Contusion and laceration of left cerebrum without loss of consciousness, initial encounter: Secondary | ICD-10-CM

## 2016-04-23 DIAGNOSIS — I428 Other cardiomyopathies: Secondary | ICD-10-CM | POA: Diagnosis present

## 2016-04-23 DIAGNOSIS — R402112 Coma scale, eyes open, never, at arrival to emergency department: Secondary | ICD-10-CM | POA: Diagnosis present

## 2016-04-23 DIAGNOSIS — I629 Nontraumatic intracranial hemorrhage, unspecified: Secondary | ICD-10-CM | POA: Diagnosis not present

## 2016-04-23 DIAGNOSIS — I618 Other nontraumatic intracerebral hemorrhage: Secondary | ICD-10-CM | POA: Diagnosis present

## 2016-04-23 DIAGNOSIS — F0390 Unspecified dementia without behavioral disturbance: Secondary | ICD-10-CM | POA: Diagnosis present

## 2016-04-23 DIAGNOSIS — R402212 Coma scale, best verbal response, none, at arrival to emergency department: Secondary | ICD-10-CM | POA: Diagnosis present

## 2016-04-23 DIAGNOSIS — G935 Compression of brain: Secondary | ICD-10-CM | POA: Diagnosis present

## 2016-04-23 DIAGNOSIS — R4189 Other symptoms and signs involving cognitive functions and awareness: Secondary | ICD-10-CM

## 2016-04-23 HISTORY — DX: Presence of heart assist device: Z95.811

## 2016-04-23 HISTORY — DX: Conduction disorder, unspecified: I45.9

## 2016-04-23 LAB — COMPREHENSIVE METABOLIC PANEL
ALBUMIN: 4.3 g/dL (ref 3.5–5.0)
ALK PHOS: 58 U/L (ref 38–126)
ALT: 21 U/L (ref 17–63)
AST: 55 U/L — AB (ref 15–41)
Anion gap: 13 (ref 5–15)
BILIRUBIN TOTAL: 1.5 mg/dL — AB (ref 0.3–1.2)
BUN: 15 mg/dL (ref 6–20)
CO2: 22 mmol/L (ref 22–32)
Calcium: 9.4 mg/dL (ref 8.9–10.3)
Chloride: 105 mmol/L (ref 101–111)
Creatinine, Ser: 1.01 mg/dL (ref 0.61–1.24)
GFR calc Af Amer: >60 mL/min (ref >60)
GFR calc non Af Amer: >60 mL/min (ref >60)
GLUCOSE: 234 mg/dL — AB (ref 65–99)
POTASSIUM: 4 mmol/L (ref 3.5–5.1)
SODIUM: 140 mmol/L (ref 135–145)
TOTAL PROTEIN: 7.1 g/dL (ref 6.5–8.1)

## 2016-04-23 LAB — I-STAT ARTERIAL BLOOD GAS, ED
Acid-Base Excess: 1 mmol/L (ref 0.0–2.0)
Bicarbonate: 26.7 mmol/L (ref 20.0–28.0)
O2 SAT: 100 %
PCO2 ART: 44.2 mmHg (ref 32.0–48.0)
PH ART: 7.389 (ref 7.350–7.450)
PO2 ART: 639 mmHg — AB (ref 83.0–108.0)
TCO2: 28 mmol/L (ref 0–100)

## 2016-04-23 LAB — PROTIME-INR
INR: 2.15
Prothrombin Time: 24.4 seconds — ABNORMAL HIGH (ref 11.4–15.2)

## 2016-04-23 LAB — TROPONIN I: Troponin I: 0.03 ng/mL (ref <0.03)

## 2016-04-23 LAB — CBC WITH DIFFERENTIAL/PLATELET
BASOS ABS: 0 10*3/uL (ref 0.0–0.1)
Basophils Relative: 0 %
EOS ABS: 0.1 10*3/uL (ref 0.0–0.7)
Eosinophils Relative: 1 %
HEMATOCRIT: 42.1 % (ref 39.0–52.0)
HEMOGLOBIN: 13.2 g/dL (ref 13.0–17.0)
LYMPHS PCT: 18 %
Lymphs Abs: 2 10*3/uL (ref 0.7–4.0)
MCH: 26.8 pg (ref 26.0–34.0)
MCHC: 31.4 g/dL (ref 30.0–36.0)
MCV: 85.4 fL (ref 78.0–100.0)
Monocytes Absolute: 0.3 10*3/uL (ref 0.1–1.0)
Monocytes Relative: 3 %
NEUTROS PCT: 78 %
Neutro Abs: 8.8 10*3/uL — ABNORMAL HIGH (ref 1.7–7.7)
Platelets: 79 10*3/uL — ABNORMAL LOW (ref 150–400)
RBC: 4.93 MIL/uL (ref 4.22–5.81)
RDW: 17.3 % — ABNORMAL HIGH (ref 11.5–15.5)
WBC: 11.2 10*3/uL — AB (ref 4.0–10.5)

## 2016-04-23 MED ORDER — SODIUM CHLORIDE 0.9 % IV SOLN
10.0000 mg/h | INTRAVENOUS | Status: DC
  Administered 2016-04-23: 10 mg/h via INTRAVENOUS
  Administered 2016-04-23: 2 mg/h via INTRAVENOUS
  Filled 2016-04-23 (×2): qty 10

## 2016-04-23 MED ORDER — FENTANYL CITRATE (PF) 100 MCG/2ML IJ SOLN
INTRAMUSCULAR | Status: AC
  Administered 2016-04-23: 100 ug via INTRAVENOUS
  Filled 2016-04-23: qty 2

## 2016-04-23 MED ORDER — FENTANYL CITRATE (PF) 100 MCG/2ML IJ SOLN
100.0000 ug | Freq: Once | INTRAMUSCULAR | Status: AC
  Administered 2016-04-23: 100 ug via INTRAVENOUS

## 2016-04-23 MED ORDER — MIDAZOLAM HCL 2 MG/2ML IJ SOLN
INTRAMUSCULAR | Status: AC
  Administered 2016-04-23: 5 mg via INTRAVENOUS
  Filled 2016-04-23: qty 6

## 2016-04-23 MED ORDER — ONDANSETRON HCL 4 MG/2ML IJ SOLN
INTRAMUSCULAR | Status: AC
  Filled 2016-04-23: qty 2

## 2016-04-23 MED ORDER — ATROPINE SULFATE 1 % OP SOLN
2.0000 [drp] | Freq: Four times a day (QID) | OPHTHALMIC | Status: DC | PRN
  Filled 2016-04-23: qty 2

## 2016-04-23 MED ORDER — ETOMIDATE 2 MG/ML IV SOLN
INTRAVENOUS | Status: DC | PRN
  Administered 2016-04-23: 20 mg via INTRAVENOUS

## 2016-04-23 MED ORDER — SUCCINYLCHOLINE CHLORIDE 20 MG/ML IJ SOLN
INTRAMUSCULAR | Status: DC | PRN
  Administered 2016-04-23: 150 mg via INTRAVENOUS

## 2016-04-23 MED ORDER — MIDAZOLAM HCL 2 MG/2ML IJ SOLN
5.0000 mg | Freq: Once | INTRAMUSCULAR | Status: AC
  Administered 2016-04-23: 5 mg via INTRAVENOUS

## 2016-04-23 MED ORDER — ONDANSETRON HCL 4 MG/2ML IJ SOLN
4.0000 mg | Freq: Once | INTRAMUSCULAR | Status: AC
  Administered 2016-04-23: 4 mg via INTRAVENOUS

## 2016-04-23 MED ORDER — SODIUM CHLORIDE 0.9 % IV SOLN
10.0000 mg/h | INTRAVENOUS | Status: DC
  Administered 2016-04-23: 2 mg/h via INTRAVENOUS
  Filled 2016-04-23: qty 5
  Filled 2016-04-23: qty 10

## 2016-04-23 MED ORDER — ONDANSETRON HCL 4 MG/2ML IJ SOLN: 4.0000 mg | Freq: Four times a day (QID) | INTRAMUSCULAR | Status: DC | PRN

## 2016-04-23 MED ORDER — ACETAMINOPHEN 325 MG PO TABS: 650.0000 mg | ORAL_TABLET | ORAL | Status: DC | PRN

## 2016-04-24 ENCOUNTER — Encounter: Payer: Self-pay | Admitting: Neurology

## 2016-04-29 ENCOUNTER — Encounter (HOSPITAL_COMMUNITY): Payer: Medicare Other

## 2016-05-01 NOTE — Telephone Encounter (Signed)
A user error has taken place: encounter opened in error, closed for administrative reasons.

## 2016-05-07 ENCOUNTER — Ambulatory Visit (INDEPENDENT_AMBULATORY_CARE_PROVIDER_SITE_OTHER): Payer: Medicare Other | Admitting: Orthopaedic Surgery

## 2016-05-07 NOTE — Progress Notes (Signed)
  Catch-up note:  Patient large ICH with profound neurologic injury. D/w NSU and patient will not recover. After multiple discussions with family, I terminally extubated patient and deactivated LVAD around 1030a yesterday.  Patient given versed and morphine prior to extubation and started on drips for comfort.  Patient eventually passed around 530pm.   Additional CCT.   Bensimhon, Daniel,MD 9:43 AM

## 2016-05-07 NOTE — ED Notes (Signed)
LVAD coordinator contacted.  

## 2016-05-07 NOTE — Progress Notes (Signed)
CSW met with family at bedside to provide support. Family all spoke at length about memories and grateful for the support from the LVAD team. Family in shock over events leading to hospitalization and said repeatedly "he has been doing so well, Why?" CSW provided supportive intervention and attempted to provide some anticipatory grief counseling. CSW available as needed and will follow up with family. Raquel Sarna, [place], [place]

## 2016-05-07 NOTE — H&P (Addendum)
VAD TEAM History & Physical Note   Reason for Admission: ICH   HPI:     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   Post-op course c/b early dementia.   Has been active doing well except for cognitive difficulties. Yesterday INR 1.9 so given extra coumadin. This am woke up with eye pain and HA. Symptoms worsened then developed n/v and collapsed. Initially not moving left side. Brought to ER and was unresponsive. Stat head CT (reviewed personally). Family denies fall or any head trauma.   Acute large LEFT cerebral intraparenchymal hematoma and LEFT 15 mm holo hemispheric acute subdural hematoma (inferred direct extension). 2 cm LEFT-to-RIGHT midline shift with RIGHT ventricular Entrapment. Basal cistern effacement and suspected early acute brainstem Infarct.  On exam patient unresponsive with fixed and dilated pupils bilaterally with upgoing toes. Case reviewed with ER team and NSU and felt that he had irreversible, profound neurologic injury. Admitting for comfort care and terminal extubation.     LVAD INTERROGATION:  HeartMate II LVAD:  Flow 4.6 liters/min, speed 9200, power 5.2, PI 7.2.     Review of Systems: [y] = yes, [ ]  = no  Provided by family  General: Weight gain [ ] ; Weight loss [ ] ; Anorexia [ ] ; Fatigue [ ] ; Fever [ ] ; Chills [ ] ; Weakness [ ]   Cardiac: Chest pain/pressure [ ] ; Resting SOB [ ] ; Exertional SOB [ ] ; Orthopnea [ ] ; Pedal Edema [ ] ; Palpitations [ ] ; Syncope [ ] ; Presyncope [ ] ; Paroxysmal nocturnal dyspnea[ ]   Pulmonary: Cough [ ] ; Wheezing[ ] ; Hemoptysis[ ] ; Sputum [ ] ; Snoring [ ]   GI: Vomiting[y ]; Dysphagia[ ] ; Melena[ ] ; Hematochezia [ ] ; Heartburn[ ] ; Abdominal pain [ ] ; Constipation [ ] ; Diarrhea [ ] ; BRBPR [ ]   GU: Hematuria[ ] ; Dysuria [ ] ; Nocturia[ ]   Vascular: Pain in legs with walking [ ] ; Pain in feet with lying flat [ ] ;  Non-healing sores [ ] ; Stroke [ ] ; TIA [ ] ; Slurred speech [ ] ;  Neuro: Headaches[y ]; Vertigo[ ] ; Seizures[ ] ; Paresthesias[ ] ;Blurred vision [ ] ; Diplopia [ ] ; Vision changes [ ]   Ortho/Skin: Arthritis [ ] ; Joint pain [ ] ; Muscle pain [ ] ; Joint swelling [ ] ; Back Pain [ ] ; Rash [ ]   Psych: Depression[ ] ; Anxiety[ ]   Heme: Bleeding problems [ ] ; Clotting disorders [ ] ; Anemia [ ]   Endocrine: Diabetes [ ] ; Thyroid dysfunction[ ]   Home Medications Prior to Admission medications   Not on File    Past Medical History: Past Medical History:  Diagnosis Date  . Heart block   . LVAD (left ventricular assist device) present [employer])     Past Surgical History: No past surgical history on file.  Family History: No family history on file.  Social History: Social History   Social History  . Marital status: Married    Spouse name: N/A  . Number of children: N/A  . Years of education: N/A   Social History Main Topics  . Smoking status: None  . Smokeless tobacco: None  . Alcohol use None  . Drug use: Unknown  . Sexual activity: Not Asked   Other Topics Concern  . None   Social History Narrative  . None    Allergies:  Allergies  Allergen Reactions  . Lasix [Furosemide]   . Morphine And Related   .  Phenergan [Promethazine Hcl]     Objective:    Vital Signs:   Pulse Rate:  [81-111] 105 (02/15 0515) Resp:  [10-16] 16 (02/15 0515) BP: (120-140)/(64-128) 127/106 (02/15 0515) SpO2:  [98 %-100 %] 100 % (02/15 0515) FiO2 (%):  [100 %] 100 % (02/15 0321)    Mean arterial Pressure 80s  Physical Exam: General:  Unresponsive on vent HEENT: normal +ETT   Neck: supple. JVP 7. Carotids 2+ bilat; no bruits. No lymphadenopathy or thryomegaly appreciated. Cor: Mechanical heart sounds with LVAD hum present. Lungs: mechanical breath sounds Abdomen: soft, nontender, nondistended. No hepatosplenomegaly. No bruits or masses. Good bowel sounds. Driveline: C/D/I; securement device  intact and driveline incorporated Extremities: no cyanosis, clubbing, rash, edema Neuro: intubated. Unresponsive. Both pupils fixed and dilated. Toes upgoing bilaterally. Flaccid  Telemetry: NSR with frequent ectopy/PVCs  Labs: Basic Metabolic Panel:  Recent Labs Lab 05-05-16 0335  NA 140  K 4.0  CL 105  CO2 22  GLUCOSE 234*  BUN 15  CREATININE 1.01  CALCIUM 9.4    Liver Function Tests:  Recent Labs Lab 05/05/16 0335  AST 55*  ALT 21  ALKPHOS 58  BILITOT 1.5*  PROT 7.1  ALBUMIN 4.3   No results for input(s): LIPASE, AMYLASE in the last 168 hours. No results for input(s): AMMONIA in the last 168 hours.  CBC:  Recent Labs Lab 05-05-2016 0335  WBC 11.2*  NEUTROABS 8.8*  HGB 13.2  HCT 42.1  MCV 85.4  PLT 79*    Cardiac Enzymes:  Recent Labs Lab 05-05-16 0335  TROPONINI 0.03*    BNP: BNP (last 3 results) No results for input(s): BNP in the last 8760 hours.  ProBNP (last 3 results) No results for input(s): PROBNP in the last 8760 hours.   CBG: No results for input(s): GLUCAP in the last 168 hours.  Coagulation Studies:  Recent Labs  05/05/2016 0335  LABPROT 24.4*  INR 2.15    Other results:   Imaging: Ct Head Wo Contrast  Result Date: May 05, 2016 CLINICAL DATA:  Headache earlier today, now unresponsive. History of pacemaker, defibrillator, stroke. EXAM: CT HEAD WITHOUT CONTRAST TECHNIQUE: Contiguous axial images were obtained from the base of the skull through the vertex without intravenous contrast. COMPARISON:  CT HEAD January 13, 2016 FINDINGS: BRAIN: Confluent 9 x 4 cm LEFT temporal occipital and to lesser extent LEFT parietal intraparenchymal hematoma with surrounding low-density vasogenic edema. Additional small satellite hematoma less. Probable direct subdural extension, with LEFT holo hemispheric subdural hematoma measuring to 15 mm, trace LEFT falcotentorial component. 2 cm LEFT-to-RIGHT midline shift with LEFT lateral ventricle  effacement of RIGHT ventricle entrapment. Basal cistern effacement. Patchy hypodensity in the brainstem concerning for infarcts. Trace density at the obex equivocal for intraventricular hemorrhage. 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 VASCULAR: Mild calcific atherosclerosis of the carotid siphons. SKULL: No skull fracture. No significant scalp soft tissue swelling. SINUSES/ORBITS: Trace paranasal sinus mucosal thickening without air-fluid levels. Mastoid air cells are well aerated. The included ocular globes and orbital contents are non-suspicious. OTHER: None. IMPRESSION: Acute large LEFT cerebral intraparenchymal hematoma and LEFT 15 mm holo hemispheric acute subdural hematoma (inferred direct extension). 2 cm LEFT-to-RIGHT midline shift with RIGHT ventricular entrapment. Basal cistern effacement and suspected early acute brainstem infarct. Critical Value/emergent results were called by telephone at the time of interpretation on May 05, 2016 at 3:55 am to Dr. Jaci Carrel , who verbally acknowledged these results. Electronically Signed  By: Awilda Metro M.D.   On: May 06, 2016 04:01   Dg Chest Port 1 View  Result Date: 05-06-2016 CLINICAL DATA:  Endotracheal and OG tube placement. EXAM: PORTABLE CHEST 1 VIEW COMPARISON:  None. FINDINGS: Postoperative changes in the mediastinum. Left ventricular assist device is present. Cardiac pacemaker. An endotracheal tube is present with tip measuring 4 cm above the carina. Enteric tube tip in the left upper quadrant consistent with location in the body of the stomach. Shallow inspiration. Mild cardiac enlargement. No significant pulmonary vascular congestion. No focal consolidation in the lungs. No pneumothorax. No blunting of costophrenic angles. Calcified and tortuous aorta. IMPRESSION: Appliances appear in satisfactory position.  No active lung disease. Electronically  Signed   By: Burman Nieves M.D.   On: 06-May-2016 04:29         Assessment:   1. Large Intracranial bleed with brainstem herniation 2. Chronic systolic HF due to NICM 3. LVAD in place 4. Acute hypoxic respiratory failure  Plan/Discussion:    I have reviewed the CT brain personally and discussed with NSU. He has had a large ICH with midline shift and brainstem herniation. He is now unresponsive with evidence of profound neurologic injury on exam. There are no options for recovery.  I have discussed with family at bedside. Will move to ICU for terminal extubation  critical care time.    I reviewed the LVAD parameters from today, and compared the results to the patient's prior recorded data.  No programming changes were made.  The LVAD is functioning within specified parameters.  The patient performs LVAD self-test daily.  LVAD interrogation was negative for any significant power changes, alarms or PI events/speed drops.  LVAD equipment check completed and is in good working order.  Back-up equipment present.   LVAD education done on emergency procedures and precautions and reviewed exit site care.  Length of Stay: 0  Smaran Gaus 2016-05-06, 6:03 AM  VAD Team Pager [phone] (7am - 7am) +++VAD ISSUES ONLY+++   Advanced Heart Failure Team Pager [phone] (M-F; 7a - 4p)  Please contact [facility] for night-coverage after hours (4p -7a ) and weekends on [email or web address] for all non- LVAD Issues

## 2016-05-07 NOTE — Progress Notes (Signed)
Family approached me that they are ready to withdraw care for [name] at this time. Dr. Gala Romney notified. Orders given to administer 5 mg Versed IVP and 100 mcg Fentanyl IVP then proceed with terminal extubation and D/C VAD. He is on the way to the family's bedside now.   [name], RN VAD Coordinator   Office: [phone] 24/7 Emergency VAD Pager: [phone]

## 2016-05-07 NOTE — Progress Notes (Signed)
Versed 40 ml and Morphine 200 ml wasted in sink by Rocco Pauls RN & Legrand Pitts RN

## 2016-05-07 NOTE — Progress Notes (Signed)
Responded to referral from Doctor. Patient was extubate and.dactively dying. Patient supported by a large family at bedside. Provided emotional, spiritual and grief support and made family aware that we are here to continue support as needed.   05/17/16 1037  Clinical Encounter Type  Visited With Patient and family together;Health care provider  Visit Type Initial;Spiritual support;Patient actively dying  Referral From Physician  Spiritual Encounters  Spiritual Needs Emotional;Grief support  Stress Factors  Family Stress Factors Exhausted;Loss  Venida Jarvis, Chaplain,pager [phone]

## 2016-05-07 NOTE — Progress Notes (Signed)
Chaplain was page to support family of Pt that was transitiooning. [place] prayed with Pt while inh the ED. Then escorted family to 4N ICU and waithed in the waiting room. Initated  talks of the life of the Pt. Family were very close. Wife, daughter, granddaughter and sibling were present. Chaplain escorted family to Pts room and prayed with them and continued to initiate memories. Renato Gails of family was contacted and is on the way.    05/18/16 0700  Clinical Encounter Type  Visited With Patient and family together  Visit Type Initial;Spiritual support  Referral From Physician  Spiritual Encounters  Spiritual Needs Emotional;Grief support  Stress Factors  Patient Stress Factors Loss  Family Stress Factors Loss

## 2016-05-07 NOTE — Telephone Encounter (Signed)
Page received at 3:30 this morning to report that [name] is in [facility] ER and is currently unresponsive. Wife reported he had N/V and headache prior to becoming weak on right side and unresponsive at home. Currently being intubated. Verified that his LVAD is on and working - MD was able to report flows and validate no alarms. Concerned with ICH and taking to CT now. Provided ED MD with Dr. Prescott Gum pager number.   [name], RN VAD Coordinator   Office: [phone] 24/7 Emergency VAD Pager: [phone]

## 2016-05-07 NOTE — Discharge Summary (Addendum)
  Advanced Heart Failure Death Summary  Death Summary   Patient ID: [name] MRN: [id number], DOB/AGE: [birth year] 76 y.o. Admit date: May 01, 2016 D/C date:     May 01, 2016   Primary Discharge Diagnoses:  1. Large intracranial hemorrhage with midline shift and brainstem herniation 2. Chronic systolic HF due to NICM 3. LVAD in place 4. Acute hypoxic respiratory failure  Hospital Course:   [name] was a 76 y.o. male with h/o of chronic systolic heart failure due to LMNA cardiomyopathy and frequent PVCs who underwent HM-II VAD placement on 08/30/15.  Post op course complicated by early dementia.    Up to this admission, patient had been doing well except for cognitive difficulties.  INR check 04/22/16 revealed INR 1.9, so given dose of extra coumadin.  Pt woke up am of 2016-05-01 with eye pain and worsening HA leading to N/V and collapse.  Pt initially demonstrated Left sided deficit with little to no movement.  Brought to [facility] where he remained unresponsive.   Stat Head CT showed Acute large LEFT cerebral intraparenchymal hematoma and LEFT 15 mm holo hemispheric acute subdural hematoma with 2cm LEFT TO RIGHT midline shift with Right ventricular Entrapment. CT also showed basal cistern effacement and suspected early acute brainstem infarct.     Pt exam revealed fixed, dilated pupils bilaterally with up going toes. Case reviewed in depth with ER team and NSU.  It was felt that patient had irreversible, profound neurologic injury with large ICH with midline shift and brainstem herniation and he was therefore admitted for comfort care and terminal extubation. Discussed with family at length and decision made to pursue comfort care.    Pt given versed and morphine prior to terminal extubation and deactivation of LVAD around 1030 05/01/16.  Drips of same started for comfort.     Pt passed aware on comfort meds surrounded by family at approx 1730 05-01-16.  Duration of Discharge  Encounter: Greater than 35 minutes   Signed, Luane School 04/24/2016, 7:16 AM   Patient seen and examined with Otilio Saber, PA-C. We discussed all aspects of the encounter. I agree with the assessment and plan as stated above.   Jhovanny Guinta,MD 3:44 PM

## 2016-05-07 NOTE — ED Provider Notes (Signed)
INTUBATION Performed by: Dierdre Forth  Required items: required blood products, implants, devices, and special equipment available Patient identity confirmed: provided demographic data and hospital-assigned identification number Time out: Immediately prior to procedure a "time out" was called to verify the correct patient, procedure, equipment, support staff and site/side marked as required.  Indications: Respiratory insufficiency, agonal respirations, altered LOC  Intubation method: Glidescope Laryngoscopy   Preoxygenation: Nonrebreather, BVM  Sedatives: 20mg  Etomidate Paralytic: 150mg  Succinylcholine  Tube Size: 7.5 cuffed  Post-procedure assessment: chest rise and ETCO2 monitor Breath sounds: equal and absent over the epigastrium Tube secured with: ETT holder Chest x-ray interpreted by radiologist and me.  Chest x-ray findings:  endotracheal tube in appropriate position  Patient tolerated the procedure well with no immediate complications.      Edgar Client Baden Betsch, PA-C May 08, 2016 2694    Gilda Crease, MD 2016/05/08 [phone]

## 2016-05-07 NOTE — Progress Notes (Signed)
Pt expired with family at the bedside.  2 RN's auscultated for 1 minute for heart and lung sounds.  None heard.  Support provided for the family.  Chaplain had already been offered.  Family very thankful of staff's care of the pt.    Aretta Nip, RN

## 2016-05-07 NOTE — ED Notes (Signed)
Dr. Bensimhon at bedside  

## 2016-05-07 NOTE — ED Triage Notes (Signed)
Pt to ED from home by [facility]. Originally called out for NV, pt found on the toilet flaccid on the R side and unresponsive. Per EMS pt had some grip on L side. Family reported pt had been c/o pain behind eyes today. LSN 2AM

## 2016-05-07 NOTE — ED Provider Notes (Signed)
[facility]-EMERGENCY DEPT Provider Note   CSN: [id number] Arrival date & time: 2016-04-25  1191  By signing my name below, I, Freida Busman, attest that this documentation has been prepared under the direction and in the presence of Gilda Crease, MD . Electronically Signed: Freida Busman, Scribe. 25-Apr-2016. 3:28 AM.  History   Chief Complaint Chief Complaint  Patient presents with  . Altered Mental Status   LEVEL 5 CAVEAT DUE TO ACUITY OF MEDICAL CONDITION  The history is provided by the EMS personnel. No language interpreter was used.     HPI Comments:  [name] is a 76 y.o. male with an LVAD in place who presents to the Emergency Department via EMS unresponsive. Per EMS they received a call for vomiting and diarrhea but upon arrival to the pt he was had become unresponsive while on the toilet. EMS states pt appeared to be flaccid on the right. Pt's family reported to EMS that the pt was complaining of eye pain and HA earlier in the day. CBG was 166 on scene.    Past Medical History:  Diagnosis Date  . Heart block   . LVAD (left ventricular assist device) present (HCC)     There are no active problems to display for this patient.   No past surgical history on file.     Home Medications    Prior to Admission medications   Not on File    Family History No family history on file.  Social History Social History  Substance Use Topics  . Smoking status: Not on file  . Smokeless tobacco: Not on file  . Alcohol use Not on file     Allergies   Lasix [furosemide]; Morphine and related; and Phenergan [promethazine hcl]   Review of Systems Review of Systems  Unable to perform ROS: Acuity of condition     Physical Exam Updated Vital Signs BP (!) 129/113   Pulse 108   Resp 15   Ht 6' (1.829 m)   SpO2 100%   Physical Exam  Constitutional: He appears well-developed.  HENT:  Head: Atraumatic.  Eyes:  Pupils equal, mid and minimally reactive   Neck: Neck supple.  Cardiovascular: Frequent extrasystoles are present. Tachycardia present.   Pulmonary/Chest: He is in respiratory distress. He has decreased breath sounds.  Abdominal: Soft.  Musculoskeletal: He exhibits no edema.  Neurological: He is unresponsive.  Skin: Skin is warm and dry.  Psychiatric: He is noncommunicative.  Nursing note and vitals reviewed.    ED Treatments / Results  DIAGNOSTIC STUDIES:  Oxygen Saturation is 100% on ETT, normal by my interpretation.    Labs (all labs ordered are listed, but only abnormal results are displayed) Labs Reviewed  CBC WITH DIFFERENTIAL/PLATELET - Abnormal; Notable for the following:       Result Value   WBC 11.2 (*)    RDW 17.3 (*)    All other components within normal limits  COMPREHENSIVE METABOLIC PANEL - Abnormal; Notable for the following:    Glucose, Bld 234 (*)    AST 55 (*)    Total Bilirubin 1.5 (*)    All other components within normal limits  TROPONIN I - Abnormal; Notable for the following:    Troponin I 0.03 (*)    All other components within normal limits  PROTIME-INR - Abnormal; Notable for the following:    Prothrombin Time 24.4 (*)    All other components within normal limits    EKG  EKG Interpretation None  Radiology Ct Head Wo Contrast  Result Date: 04-28-16 CLINICAL DATA:  Headache earlier today, now unresponsive. History of pacemaker, defibrillator, stroke. EXAM: CT HEAD WITHOUT CONTRAST TECHNIQUE: Contiguous axial images were obtained from the base of the skull through the vertex without intravenous contrast. COMPARISON:  CT HEAD January 13, 2016 FINDINGS: BRAIN: Confluent 9 x 4 cm LEFT temporal occipital and to lesser extent LEFT parietal intraparenchymal hematoma with surrounding low-density vasogenic edema. Additional small satellite hematoma less. Probable direct subdural extension, with LEFT holo hemispheric subdural hematoma measuring to 15 mm, trace LEFT falcotentorial  component. 2 cm LEFT-to-RIGHT midline shift with LEFT lateral ventricle effacement of RIGHT ventricle entrapment. Basal cistern effacement. Patchy hypodensity in the brainstem concerning for infarcts. Trace density at the obex equivocal for intraventricular hemorrhage. 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 VASCULAR: Mild calcific atherosclerosis of the carotid siphons. SKULL: No skull fracture. No significant scalp soft tissue swelling. SINUSES/ORBITS: Trace paranasal sinus mucosal thickening without air-fluid levels. Mastoid air cells are well aerated. The included ocular globes and orbital contents are non-suspicious. OTHER: None. IMPRESSION: Acute large LEFT cerebral intraparenchymal hematoma and LEFT 15 mm holo hemispheric acute subdural hematoma (inferred direct extension). 2 cm LEFT-to-RIGHT midline shift with RIGHT ventricular entrapment. Basal cistern effacement and suspected early acute brainstem infarct. Critical Value/emergent results were called by telephone at the time of interpretation on 2016-04-28 at 3:55 am to Dr. Jaci Carrel , who verbally acknowledged these results. Electronically Signed   By: Awilda Metro M.D.   On: 04-28-16 04:01    Procedures Procedures (including critical care time)  3:25 AM EMERGENT INTUBATION PERFORMED AT THIS TIME BY [facility] PA-C.   CRITICAL CARE Performed by: Gilda Crease, MD  Total critical care time: 30 minutes  Critical care time was exclusive of separately billable procedures and treating other patients.  Critical care was necessary to treat or prevent imminent or life-threatening deterioration.  Critical care was time spent personally by me on the following activities: development of treatment plan with patient and/or surrogate as well as nursing, discussions with consultants, evaluation of patient's response to treatment,  examination of patient, obtaining history from patient or surrogate, ordering and performing treatments and interventions, ordering and review of laboratory studies, ordering and review of radiographic studies, pulse oximetry and re-evaluation of patient's condition.   Medications Ordered in ED Medications  etomidate (AMIDATE) injection (20 mg Intravenous Given 2016/04/28 0320)  succinylcholine (ANECTINE) injection (150 mg Intravenous Given 2016-04-28 0321)  ondansetron (ZOFRAN) injection 4 mg (4 mg Intravenous Given 04-28-2016 0325)     Initial Impression / Assessment and Plan / ED Course  I have reviewed the triage vital signs and the nursing notes.  Pertinent labs & imaging results that were available during my care of the patient were reviewed by me and considered in my medical decision making (see chart for details).     Patient presents to the emergency department for evaluation after he collapsed at home. Patient's wife reports that he had been complaining about his eye hurting and a slight headache. He then reportedly became nauseated, vomited and then slumped over unresponsive. At arrival to the ER, patient was minimally responsive. He did not have any significant gag or corneal reflex. He did not appear to be protecting his airway and therefore was intubated. He was brought to radiology for evaluation and CAT scan of the head shows large intracranial bleed.  Family has been updated on  his condition. Discussed with Dr. Teressa Lower, on call for LVAD. He will evaluate the patient in the ER.    Final Clinical Impressions(s) / ED Diagnoses   Final diagnoses:  Unresponsive  Intraparenchymal hematoma of brain, left, without loss of consciousness, initial encounter (HCC)  SDH (subdural hematoma) (HCC)    New Prescriptions New Prescriptions   No medications on file   I personally performed the services described in this documentation, which was scribed in my presence. The recorded  information has been reviewed and is accurate.     Gilda Crease, MD 05-23-2016 [phone]

## 2016-05-07 DEATH — deceased

## 2016-05-19 ENCOUNTER — Telehealth (HOSPITAL_COMMUNITY): Payer: Self-pay | Admitting: *Deleted

## 2016-05-19 NOTE — Telephone Encounter (Signed)
Received a physician form from [employer] [place] (claim department) from patient's wife on 05/12/2016.  Forms completed and mailed back to [name], [address], [place] [address] as requested.  I have called [name] and she is aware. Original request will be scanned to patient's electronic record.

## 2016-05-25 ENCOUNTER — Telehealth: Payer: Self-pay | Admitting: Licensed Clinical Social Worker

## 2016-05-25 NOTE — Telephone Encounter (Signed)
CSW attempted to make bereavement call to wife and family. CSW left message for return call if interested. Lasandra Beech, LCSW, CCSW-[facility] [phone]

## 2016-07-07 ENCOUNTER — Ambulatory Visit (INDEPENDENT_AMBULATORY_CARE_PROVIDER_SITE_OTHER): Payer: Medicare Other | Admitting: Orthopedic Surgery

## 2016-10-09 ENCOUNTER — Ambulatory Visit: Payer: Medicare Other | Admitting: Neurology

## 2018-02-21 IMAGING — CR DG CHEST 1V PORT
1 series · 1 of 1 positions shown · non-contrast
Comparison: 05/13/2015

CLINICAL DATA: Evaluate PICC line placement.

EXAM:
PORTABLE CHEST 1 VIEW

[AP]
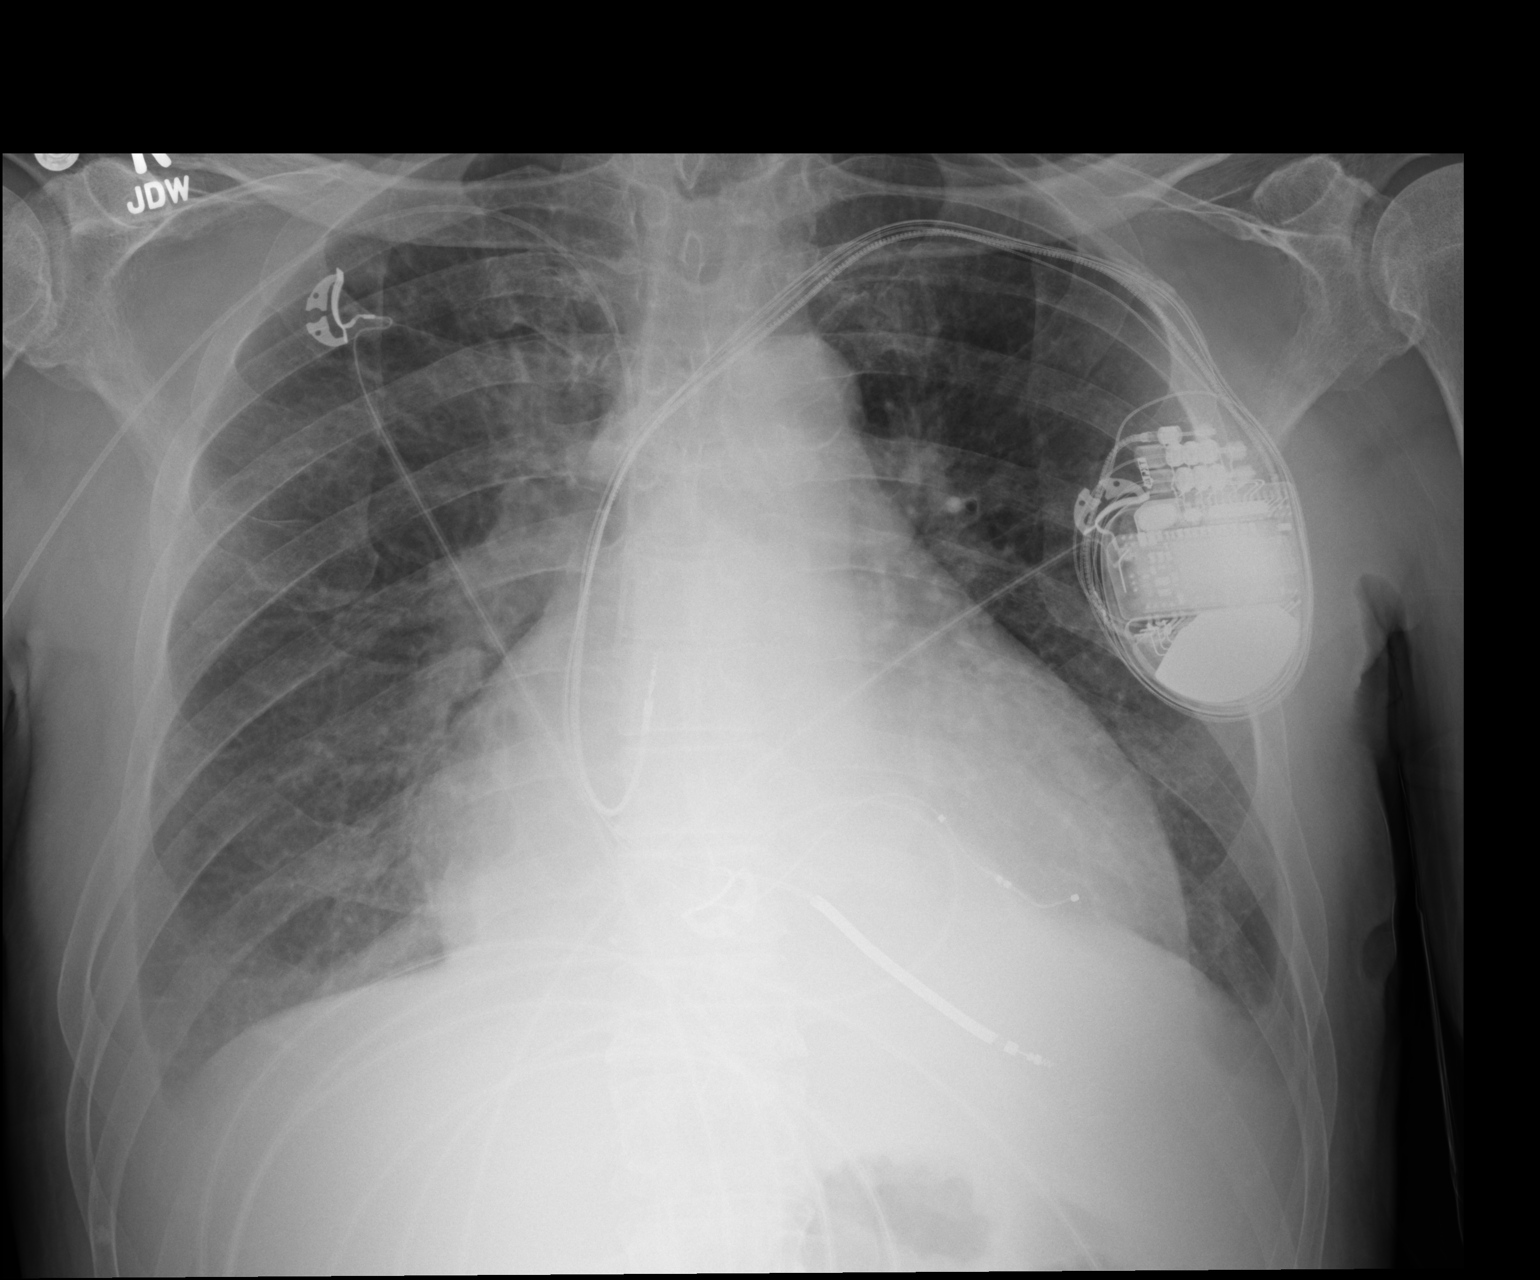

[1 of 1 positions shown; findings below may reference images not displayed]

FINDINGS: There is a right arm PICC line. PICC line tip in the lower SVC
region. There is a left biventricular cardiac ICD. Evidence for
bilateral pleural effusions, right side greater than left. Haziness
in the right lung may be related to pleural fluid. Heart size
remains mildly enlarged. No definite pulmonary edema.
IMPRESSION: PICC line tip in the lower SVC region.

Stable mild cardiomegaly.

Bilateral pleural effusions, right side greater than left.
